# Patient Record
Sex: Male | Born: 1945
Health system: Southern US, Community
[De-identification: ages and names within clinical notes are randomized; demographics above are authoritative.]

## PROBLEM LIST (undated history)

## (undated) DIAGNOSIS — F32A Depression, unspecified: Secondary | ICD-10-CM

## (undated) DIAGNOSIS — R519 Headache, unspecified: Secondary | ICD-10-CM

## (undated) DIAGNOSIS — E119 Type 2 diabetes mellitus without complications: Secondary | ICD-10-CM

## (undated) DIAGNOSIS — B1920 Unspecified viral hepatitis C without hepatic coma: Secondary | ICD-10-CM

## (undated) DIAGNOSIS — E162 Hypoglycemia, unspecified: Secondary | ICD-10-CM

## (undated) DIAGNOSIS — I2510 Atherosclerotic heart disease of native coronary artery without angina pectoris: Secondary | ICD-10-CM

## (undated) DIAGNOSIS — I639 Cerebral infarction, unspecified: Secondary | ICD-10-CM

## (undated) DIAGNOSIS — T7840XA Allergy, unspecified, initial encounter: Secondary | ICD-10-CM

## (undated) DIAGNOSIS — E785 Hyperlipidemia, unspecified: Secondary | ICD-10-CM

## (undated) DIAGNOSIS — K219 Gastro-esophageal reflux disease without esophagitis: Secondary | ICD-10-CM

## (undated) DIAGNOSIS — F419 Anxiety disorder, unspecified: Secondary | ICD-10-CM

## (undated) DIAGNOSIS — I209 Angina pectoris, unspecified: Secondary | ICD-10-CM

## (undated) DIAGNOSIS — F329 Major depressive disorder, single episode, unspecified: Secondary | ICD-10-CM

## (undated) DIAGNOSIS — I10 Essential (primary) hypertension: Secondary | ICD-10-CM

## (undated) DIAGNOSIS — I509 Heart failure, unspecified: Secondary | ICD-10-CM

## (undated) DIAGNOSIS — R51 Headache: Secondary | ICD-10-CM

## (undated) HISTORY — DX: Headache, unspecified: R51.9

## (undated) HISTORY — DX: Essential (primary) hypertension: I10

## (undated) HISTORY — DX: Anxiety disorder, unspecified: F41.9

## (undated) HISTORY — DX: Allergy, unspecified, initial encounter: T78.40XA

## (undated) HISTORY — DX: Headache: R51

## (undated) HISTORY — DX: Major depressive disorder, single episode, unspecified: F32.9

## (undated) HISTORY — DX: Unspecified viral hepatitis C without hepatic coma: B19.20

## (undated) HISTORY — DX: Depression, unspecified: F32.A

## (undated) HISTORY — DX: Cerebral infarction, unspecified: I63.9

## (undated) HISTORY — PX: APPENDECTOMY: SHX54

## (undated) HISTORY — PX: COLONOSCOPY: SHX174

## (undated) HISTORY — PX: FRACTURE SURGERY: SHX138

## (undated) HISTORY — DX: Hypoglycemia, unspecified: E16.2

## (undated) HISTORY — DX: Hyperlipidemia, unspecified: E78.5

## (undated) HISTORY — PX: CORONARY ARTERY BYPASS GRAFT: SHX141

---
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
## 2007-08-09 ENCOUNTER — Emergency Department (HOSPITAL_COMMUNITY): Admission: EM | Admit: 2007-08-09 | Discharge: 2007-08-09 | Payer: Self-pay | Admitting: Emergency Medicine
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
## 2012-02-02 ENCOUNTER — Ambulatory Visit (INDEPENDENT_AMBULATORY_CARE_PROVIDER_SITE_OTHER): Payer: Medicare Other | Admitting: Family Medicine

## 2012-02-02 VITALS — BP 140/80 | HR 80 | Temp 98.0°F | Resp 16 | Ht 71.5 in | Wt 175.2 lb

## 2012-02-02 DIAGNOSIS — Z720 Tobacco use: Secondary | ICD-10-CM | POA: Insufficient documentation

## 2012-02-02 DIAGNOSIS — R030 Elevated blood-pressure reading, without diagnosis of hypertension: Secondary | ICD-10-CM

## 2012-02-02 DIAGNOSIS — I10 Essential (primary) hypertension: Secondary | ICD-10-CM | POA: Insufficient documentation

## 2012-02-02 DIAGNOSIS — J329 Chronic sinusitis, unspecified: Secondary | ICD-10-CM

## 2012-02-02 DIAGNOSIS — F17200 Nicotine dependence, unspecified, uncomplicated: Secondary | ICD-10-CM

## 2012-02-02 MED ORDER — PREDNISONE (PAK) 10 MG PO TABS: 20.0000 mg | ORAL_TABLET | Freq: Every day | ORAL | Status: AC

## 2012-02-02 MED ORDER — AMOXICILLIN 875 MG PO TABS: 875.0000 mg | ORAL_TABLET | Freq: Two times a day (BID) | ORAL | Status: DC

## 2012-02-02 NOTE — Progress Notes (Signed)
  Subjective:    Patient ID: Collin Bridges, male    DOB: [birth year], 66 y.o.   MRN: [id number]  HPI  Has had sinus problems since 57 yo.  Father also has bad sinus prob.  Every year, he gets sinus infection. The only thing that works for him is amoxicillin and prednisone.  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 Did have deviated septum correction.  Was told by ENT doctor not to use any nasal sprays as didn't have any affect on him.  Does netti pot daily.  Having sub f/c, worsening x 5 days now.    Review of Systems  Constitutional: Positive for fever and fatigue. Negative for chills.  HENT: Positive for ear pain. Negative for congestion, sore throat, rhinorrhea and ear discharge.   Respiratory: Negative for cough and shortness of breath.   Musculoskeletal: Positive for myalgias. Negative for gait problem.       Objective:   Physical Exam  Constitutional: He is oriented to person, place, and time. He appears well-developed and well-nourished. No distress.  HENT:  Head: Normocephalic and atraumatic.  Right Ear: External ear normal.  Left Ear: External ear normal.  Nose: Nose normal.  Mouth/Throat: Oropharynx is clear and moist. No oropharyngeal exudate.  Eyes: Conjunctivae normal and EOM are normal. Pupils are equal, round, and reactive to light. Right eye exhibits no discharge. Left eye exhibits no discharge. No scleral icterus.  Neck: Normal range of motion. Neck supple. No thyromegaly present.  Cardiovascular: Regular rhythm, normal heart sounds and intact distal pulses.   Pulmonary/Chest: Effort normal and breath sounds normal. No respiratory distress.  Lymphadenopathy:    He has no cervical adenopathy.  Neurological: He is alert and oriented to person, place, and time.  Skin: Skin is warm and dry. He is not diaphoretic.  Psychiatric: He has a normal mood and affect.          Assessment & Plan:  1. Sinusitis - Will cover w/ pt's normal regimen that works for  him - amox and pred. Refills given due to recurrent illness per pt request.  Cont daily netti pot 1. Elevated blood pressure -  Fast track RTC for recheck when feeling better. 2. Tob abuse - Not interested in cessation at all now.

## 2012-02-02 NOTE — Progress Notes (Signed)
Reviewed and agree.

## 2012-02-18 ENCOUNTER — Ambulatory Visit (INDEPENDENT_AMBULATORY_CARE_PROVIDER_SITE_OTHER): Payer: Medicare Other | Admitting: Family Medicine

## 2012-02-18 VITALS — BP 164/90 | HR 85 | Temp 97.9°F | Resp 16 | Ht 71.0 in | Wt 172.0 lb

## 2012-02-18 DIAGNOSIS — J019 Acute sinusitis, unspecified: Secondary | ICD-10-CM

## 2012-02-18 DIAGNOSIS — J329 Chronic sinusitis, unspecified: Secondary | ICD-10-CM

## 2012-02-18 MED ORDER — PREDNISONE 20 MG PO TABS: ORAL_TABLET | ORAL | Status: DC

## 2012-02-18 MED ORDER — LEVOFLOXACIN 500 MG PO TABS: 500.0000 mg | ORAL_TABLET | Freq: Every day | ORAL | Status: DC

## 2012-02-18 NOTE — Progress Notes (Signed)
@[email or web address] @   Patient ID: Collin Bridges MRN: [id number], DOB: [birth year], 66 y.o. Date of Encounter: 02/18/2012, 9:59 AM  Primary Physician: Elvina Sidle, MD  Chief Complaint:  Chief Complaint  Patient presents with  . Sinusitis    x 3 weeks     HPI: 66 y.o. year old male presents with 30 day history of nasal congestion, post nasal drip, sore throat, sinus pressure, and cough. Afebrile. No chills. Nasal congestion thick and green/yellow. Sinus pressure is the worst symptom. Cough is productive secondary to post nasal drip and not associated with time of day. Ears feel full, leading to sensation of muffled hearing and tinnitus. Has tried OTC cold preps without success. No GI complaints. Appetite poor  No recent antibiotics, recent travels, or sick contacts   No leg trauma, sedentary periods, h/o cancer, or tobacco use.  No past medical history on file.   Home Meds: Prior to Admission medications   Medication Sig Start Date End Date Taking? Authorizing Provider  amoxicillin-clavulanate (AUGMENTIN) 875-125 MG per tablet Take 1 tablet by mouth 2 (two) times daily.   Yes Historical Provider, MD  aspirin 81 MG tablet Take 325 mg by mouth daily.    Yes Historical Provider, MD  amoxicillin (AMOXIL) 875 MG tablet Take 1 tablet (875 mg total) by mouth 2 (two) times daily. 02/02/12   Norberto Sorenson, MD    Allergies: No Known Allergies  History   Social History  . Marital Status: Single    Spouse Name: N/A    Number of Children: N/A  . Years of Education: N/A   Occupational History  . Not on file.   Social History Main Topics  . Smoking status: Current Some Day Smoker  . Smokeless tobacco: Not on file  . Alcohol Use: Not on file  . Drug Use: Not on file  . Sexually Active: Not on file   Other Topics Concern  . Not on file   Social History Narrative  . No narrative on file     Review of Systems: Constitutional: negative for chills, fever, night sweats or weight  changes Cardiovascular: negative for chest pain or palpitations Respiratory: negative for hemoptysis, wheezing, or shortness of breath Abdominal: negative for abdominal pain, nausea, vomiting or diarrhea Dermatological: negative for rash Neurologic: negative for headache   Physical Exam Blood pressure 164/90, pulse 85, temperature 97.9 F (36.6 C), temperature source Oral, resp. rate 16, height 5\' 11"  (1.803 m), weight 172 lb (78.019 kg), SpO2 98.00%., Body mass index is 23.99 kg/(m^2). General: Well developed, well nourished, in no acute distress. Head: Normocephalic, atraumatic, eyes without discharge, sclera non-icteric, nares are congested. Bilateral auditory canals clear, TM's are without perforation, pearly grey with reflective cone of light bilaterally. Serous effusion bilaterally behind TM's. Maxillary sinus TTP. Oral cavity moist, dentition normal. Posterior pharynx with post nasal drip and mild erythema. No peritonsillar abscess or tonsillar exudate. Neck: Supple. No thyromegaly. Full ROM. No lymphadenopathy. Lungs: Clear bilaterally to auscultation without wheezes, rales, or rhonchi. Breathing is unlabored.  Heart: RRR with S1 S2. No murmurs, rubs, or gallops appreciated. Msk:  Strength and tone normal for age. Extremities: No clubbing or cyanosis. No edema. Neuro: Alert and oriented X 3. Moves all extremities spontaneously. CNII-XII grossly in tact. Psych:  Responds to questions appropriately with a normal affect.      ASSESSMENT AND PLAN:  66 y.o. year old male with sinusitis -  -Tylenol/Motrin prn -Rest/fluids -RTC precautions -RTC 3-5 days if no improvement  Signed, Kenyon Ana  Lacreasha Hinds, MD 02/18/2012 9:59 AM

## 2012-02-18 NOTE — Patient Instructions (Signed)
sinusitisSinusitis Sinusitis is redness, soreness, and swelling (inflammation) of the paranasal sinuses. Paranasal sinuses are air pockets within the bones of your face (beneath the eyes, the middle of the forehead, or above the eyes). In healthy paranasal sinuses, mucus is able to drain out, and air is able to circulate through them by way of your nose. However, when your paranasal sinuses are inflamed, mucus and air can become trapped. This can allow bacteria and other germs to grow and cause infection. Sinusitis can develop quickly and last only a short time (acute) or continue over a long period (chronic). Sinusitis that lasts for more than 12 weeks is considered chronic.  CAUSES  Causes of sinusitis include:  Allergies.  Structural abnormalities, such as displacement of the cartilage that separates your nostrils (deviated septum), which can decrease the air flow through your nose and sinuses and affect sinus drainage.  Functional abnormalities, such as when the small hairs (cilia) that line your sinuses and help remove mucus do not work properly or are not present. SYMPTOMS  Symptoms of acute and chronic sinusitis are the same. The primary symptoms are pain and pressure around the affected sinuses. Other symptoms include:  Upper toothache.  Earache.  Headache.  Bad breath.  Decreased sense of smell and taste.  A cough, which worsens when you are lying flat.  Fatigue.  Fever.  Thick drainage from your nose, which often is green and may contain pus (purulent).  Swelling and warmth over the affected sinuses. DIAGNOSIS  Your caregiver will perform a physical exam. During the exam, your caregiver may:  Look in your nose for signs of abnormal growths in your nostrils (nasal polyps).  Tap over the affected sinus to check for signs of infection.  View the inside of your sinuses (endoscopy) with a special imaging device with a light attached (endoscope), which is inserted into  your sinuses. If your caregiver suspects that you have chronic sinusitis, one or more of the following tests may be recommended:  Allergy tests.  Nasal culture A sample of mucus is taken from your nose and sent to a lab and screened for bacteria.  Nasal cytology A sample of mucus is taken from your nose and examined by your caregiver to determine if your sinusitis is related to an allergy. TREATMENT  Most cases of acute sinusitis are related to a viral infection and will resolve on their own within 10 days. Sometimes medicines are prescribed to help relieve symptoms (pain medicine, decongestants, nasal steroid sprays, or saline sprays).  However, for sinusitis related to a bacterial infection, your caregiver will prescribe antibiotic medicines. These are medicines that will help kill the bacteria causing the infection.  Rarely, sinusitis is caused by a fungal infection. In theses cases, your caregiver will prescribe antifungal medicine. For some cases of chronic sinusitis, surgery is needed. Generally, these are cases in which sinusitis recurs more than 3 times per year, despite other treatments. HOME CARE INSTRUCTIONS   Drink plenty of water. Water helps thin the mucus so your sinuses can drain more easily.  Use a humidifier.  Inhale steam 3 to 4 times a day (for example, sit in the bathroom with the shower running).  Apply a warm, moist washcloth to your face 3 to 4 times a day, or as directed by your caregiver.  Use saline nasal sprays to help moisten and clean your sinuses.  Take over-the-counter or prescription medicines for pain, discomfort, or fever only as directed by your caregiver. SEEK IMMEDIATE MEDICAL  CARE IF:  You have increasing pain or severe headaches.  You have nausea, vomiting, or drowsiness.  You have swelling around your face.  You have vision problems.  You have a stiff neck.  You have difficulty breathing. MAKE SURE YOU:   Understand these  instructions.  Will watch your condition.  Will get help right away if you are not doing well or get worse. Document Released: 05/02/2005 Document Revised: 07/25/2011 Document Reviewed: 05/17/2011 Exit[employer] Patient Information 2013 [employer].  

## 2012-02-24 ENCOUNTER — Other Ambulatory Visit: Payer: Self-pay | Admitting: Family Medicine

## 2012-02-24 ENCOUNTER — Telehealth: Payer: Self-pay

## 2012-02-24 DIAGNOSIS — J309 Allergic rhinitis, unspecified: Secondary | ICD-10-CM

## 2012-02-24 MED ORDER — MOMETASONE FUROATE 50 MCG/ACT NA SUSP: 2.0000 | Freq: Every day | NASAL | Status: DC

## 2012-02-24 NOTE — Telephone Encounter (Signed)
Patient states he took bee pollen and his symptoms returned, he is having fullness of his sinuses and swelling. Please advise if anything further is needed, I advised patient he may need a nasal spray. Will you review and let me know. He was advised to d/c the bee pollen. Advise and I will call him back .

## 2012-02-24 NOTE — Telephone Encounter (Signed)
PT STATES DR Kenyon Ana TOLD HIM TO CALL BACK IF NO BETTER, PLEASE CALL [phone]

## 2012-02-25 ENCOUNTER — Telehealth: Payer: Self-pay

## 2012-02-25 ENCOUNTER — Ambulatory Visit (INDEPENDENT_AMBULATORY_CARE_PROVIDER_SITE_OTHER): Payer: Medicare Other | Admitting: Emergency Medicine

## 2012-02-25 VITALS — BP 150/80 | HR 88 | Temp 98.3°F | Resp 16 | Ht 72.0 in | Wt 171.0 lb

## 2012-02-25 DIAGNOSIS — J309 Allergic rhinitis, unspecified: Secondary | ICD-10-CM

## 2012-02-25 MED ORDER — OLOPATADINE HCL 0.1 % OP SOLN: 1.0000 [drp] | Freq: Two times a day (BID) | OPHTHALMIC | Status: DC

## 2012-02-25 MED ORDER — LORATADINE 10 MG PO TABS: 10.0000 mg | ORAL_TABLET | Freq: Every day | ORAL | Status: DC

## 2012-02-25 MED ORDER — MOMETASONE FUROATE 50 MCG/ACT NA SUSP: 2.0000 | Freq: Every day | NASAL | Status: DC

## 2012-02-25 NOTE — Telephone Encounter (Signed)
Pt was seen today however needs to discuss getting a cheaper nasal spray. Pt stated he can not pay $156.00 for prescription. Please call pt back at  [phone]

## 2012-02-25 NOTE — Progress Notes (Signed)
[facility] [address] [place] [address] [phone]  Date:  02/25/2012   Name:  Collin Bridges   DOB:  12-Jul-1945   MRN:  [id number]  PCP:  Elvina Sidle, MD    Chief Complaint: Sinusitis   History of Present Illness:  Collin Bridges is a 66 y.o. very pleasant male patient who presents with the following:  Clear nasal drainage and post nasal discharge.  Watery itchy eyes.  No fever or chills, sore throat or cough.  No nausea or vomiting or stool change.  Has increased allergy symptoms every fall and this year are particularly severe for him.  Patient Active Problem List  Diagnosis  . Elevated blood pressure  . Tobacco abuse    No past medical history on file.  No past surgical history on file.  History  Substance Use Topics  . Smoking status: Current Some Day Smoker  . Smokeless tobacco: Not on file  . Alcohol Use: Not on file    No family history on file.  Allergies  Allergen Reactions  . Bee Pollen     Nasal congestion     Medication list has been reviewed and updated.  Current Outpatient Prescriptions on File Prior to Visit  Medication Sig Dispense Refill  . aspirin 81 MG tablet Take 325 mg by mouth daily.       Marland Kitchen levofloxacin (LEVAQUIN) 500 MG tablet Take 1 tablet (500 mg total) by mouth daily.  10 tablet  1  . mometasone (NASONEX) 50 MCG/ACT nasal spray Place 2 sprays into the nose daily.  17 g  12  . predniSONE (DELTASONE) 20 MG tablet 2 daily with food  6 tablet  1  . amoxicillin (AMOXIL) 875 MG tablet Take 1 tablet (875 mg total) by mouth 2 (two) times daily.  28 tablet  1  . amoxicillin-clavulanate (AUGMENTIN) 875-125 MG per tablet Take 1 tablet by mouth 2 (two) times daily.      Marland Kitchen loratadine (CLARITIN) 10 MG tablet Take 1 tablet (10 mg total) by mouth daily.  30 tablet  11    Review of Systems:  As per HPI, otherwise negative.    Physical Examination: Filed Vitals:   02/25/12 1335  BP: 150/80  Pulse: 88    Temp: 98.3 F (36.8 C)  Resp: 16   Filed Vitals:   02/25/12 1335  Height: 6' (1.829 m)  Weight: 171 lb (77.565 kg)   Body mass index is 23.19 kg/(m^2). Ideal Body Weight: Weight in (lb) to have BMI = 25: 183.9   GEN: WDWN, NAD, Non-toxic, A & O x 3 HEENT: Atraumatic, Normocephalic. Neck supple. No masses, No LAD. Ears and Nose: No external deformity. CV: RRR, No M/G/R. No JVD. No thrill. No extra heart sounds. PULM: CTA B, no wheezes, crackles, rhonchi. No retractions. No resp. distress. No accessory muscle use. ABD: S, NT, ND, +BS. No rebound. No HSM. EXTR: No c/c/e NEURO Normal gait.  PSYCH: Normally interactive. Conversant. Not depressed or anxious appearing.  Calm demeanor.    Assessment and Plan: Seasonal allergic rhinitis;  Claritin, nasonex Allergic conjunctivitis; patanol Follow up as needed  Carmelina Dane, MD  I have reviewed and agree with documentation. Robert P. Merla Riches, M.D.

## 2012-02-25 NOTE — Patient Instructions (Addendum)
Allergic Rhinitis  Allergic rhinitis is when the mucous membranes in the nose respond to allergens. Allergens are particles in the air that cause your body to have an allergic reaction. This causes you to release allergic antibodies. Through a chain of events, these eventually cause you to release histamine into the blood stream (hence the use of antihistamines). Although meant to be protective to the body, it is this release that causes your discomfort, such as frequent sneezing, congestion and an itchy runny nose.    CAUSES    The pollen allergens may come from grasses, trees, and weeds. This is seasonal allergic rhinitis, or "hay fever." Other allergens cause year-round allergic rhinitis (perennial allergic rhinitis) such as house dust mite allergen, pet dander and mold spores.    SYMPTOMS     Nasal stuffiness (congestion).   Runny, itchy nose with sneezing and tearing of the eyes.   There is often an itching of the mouth, eyes and ears.  It cannot be cured, but it can be controlled with medications.  DIAGNOSIS    If you are unable to determine the offending allergen, skin or blood testing may find it.  TREATMENT     Avoid the allergen.   Medications and allergy shots (immunotherapy) can help.   Hay fever may often be treated with antihistamines in pill or nasal spray forms. Antihistamines block the effects of histamine. There are over-the-counter medicines that may help with nasal congestion and swelling around the eyes. Check with your caregiver before taking or giving this medicine.  If the treatment above does not work, there are many new medications your caregiver can prescribe. Stronger medications may be used if initial measures are ineffective. Desensitizing injections can be used if medications and avoidance fails. Desensitization is when a patient is given ongoing shots until the body becomes less sensitive to the allergen. Make sure you follow up with your caregiver if problems continue.   SEEK MEDICAL CARE IF:     You develop fever (more than 100.5 F (38.1 C).   You develop a cough that does not stop easily (persistent).   You have shortness of breath.   You start wheezing.   Symptoms interfere with normal daily activities.  Document Released: 01/25/2001 Document Revised: 07/25/2011 Document Reviewed: 08/06/2008  Exit[employer] Patient Information 2013 [employer].

## 2012-02-25 NOTE — Progress Notes (Signed)
[facility] [address], [place] [address] [phone]  Date:  02/25/2012   Name:  Collin Bridges   DOB:  09/08/1945   MRN:  [id number]  PCP:  Elvina Sidle, MD    Chief Complaint: Sinusitis   History of Present Illness:  Collin Bridges is a 66 y.o. very pleasant male patient who presents with the following: states has sinusitis which started as possible allergy to bee pollen. States he has itching of his eyes, with watering. Has gotten worse recently.     Patient Active Problem List  Diagnosis  . Elevated blood pressure  . Tobacco abuse    No past medical history on file.  No past surgical history on file.  History  Substance Use Topics  . Smoking status: Current Some Day Smoker  . Smokeless tobacco: Not on file  . Alcohol Use: Not on file    No family history on file.  No Known Allergies  Medication list has been reviewed and updated.  Current Outpatient Prescriptions on File Prior to Visit  Medication Sig Dispense Refill  . aspirin 81 MG tablet Take 325 mg by mouth daily.       Marland Kitchen levofloxacin (LEVAQUIN) 500 MG tablet Take 1 tablet (500 mg total) by mouth daily.  10 tablet  1  . mometasone (NASONEX) 50 MCG/ACT nasal spray Place 2 sprays into the nose daily.  17 g  12  . predniSONE (DELTASONE) 20 MG tablet 2 daily with food  6 tablet  1  . amoxicillin (AMOXIL) 875 MG tablet Take 1 tablet (875 mg total) by mouth 2 (two) times daily.  28 tablet  1  . amoxicillin-clavulanate (AUGMENTIN) 875-125 MG per tablet Take 1 tablet by mouth 2 (two) times daily.        Review of Systems:    Physical Examination: Filed Vitals:   02/25/12 1335  BP: 150/80  Pulse: 88  Temp: 98.3 F (36.8 C)  Resp: 16   Filed Vitals:   02/25/12 1335  Height: 6' (1.829 m)  Weight: 171 lb (77.565 kg)   Body mass index is 23.19 kg/(m^2). Ideal Body Weight: Weight in (lb) to have BMI = 25: 183.9     Assessment and Plan: Allergic rhinitis Eye  irritation from allergies Claritin daily Nasal spray daily, instruction given for nasal spray Norberto Wishon Wall, RT

## 2012-02-26 MED ORDER — FLUTICASONE PROPIONATE 50 MCG/ACT NA SUSP: 2.0000 | Freq: Every day | NASAL | Status: DC

## 2012-02-26 NOTE — Telephone Encounter (Signed)
I sent in some flonase for the patient, it is generic so it should be cheaper.

## 2012-02-26 NOTE — Telephone Encounter (Signed)
patient notified an voiced understanding.

## 2012-02-28 ENCOUNTER — Telehealth: Payer: Self-pay

## 2012-02-28 DIAGNOSIS — J309 Allergic rhinitis, unspecified: Secondary | ICD-10-CM

## 2012-02-28 NOTE — Telephone Encounter (Signed)
Patient is wanting to know if we could call him in an inhaler.  Patient also asks to be referred for his allergies.    Pharmacy: [facility]- on [address]   Best#: [phone]

## 2012-02-29 NOTE — Telephone Encounter (Signed)
Patient advised we will call with the allergist appt . The allergist can advise on the inhaler. He states he gets short of breath at times, he is advised to come in for any shortness of breath,he said if it gets worse or persists he will come in.

## 2012-02-29 NOTE — Telephone Encounter (Signed)
Need to know which inhaler he needs. May have to call pharmacy to find out since patient isn't sure what he is using. Referral for allergist done

## 2012-03-01 MED ORDER — FLUTICASONE PROPIONATE 50 MCG/ACT NA SUSP: 2.0000 | Freq: Every day | NASAL | Status: DC

## 2012-03-01 NOTE — Telephone Encounter (Signed)
Changed Rx for flonase to include 12 RFs that Dr Dareen Piano (and Dr L) had put on orig Rx for Nasonex (sent to pharmacy).Notified pt done.

## 2012-03-01 NOTE — Telephone Encounter (Signed)
Pt was using Nasonex and had 12 refills on it,but switched to cheaper generic.  Rone would like to have the 12 refills on the generic nasal spray called into [facility] on [address].  [phone] home [phone] cell

## 2012-05-03 ENCOUNTER — Other Ambulatory Visit: Payer: Self-pay | Admitting: *Deleted

## 2012-05-11 ENCOUNTER — Other Ambulatory Visit: Payer: Self-pay | Admitting: *Deleted

## 2013-07-25 DIAGNOSIS — F411 Generalized anxiety disorder: Secondary | ICD-10-CM | POA: Diagnosis not present

## 2013-10-17 DIAGNOSIS — F411 Generalized anxiety disorder: Secondary | ICD-10-CM | POA: Diagnosis not present

## 2013-10-17 DIAGNOSIS — F410 Panic disorder [episodic paroxysmal anxiety] without agoraphobia: Secondary | ICD-10-CM | POA: Diagnosis not present

## 2014-01-09 DIAGNOSIS — F411 Generalized anxiety disorder: Secondary | ICD-10-CM | POA: Diagnosis not present

## 2014-04-01 DIAGNOSIS — F411 Generalized anxiety disorder: Secondary | ICD-10-CM | POA: Diagnosis not present

## 2014-09-04 DIAGNOSIS — F411 Generalized anxiety disorder: Secondary | ICD-10-CM | POA: Diagnosis not present

## 2014-09-18 DIAGNOSIS — H1045 Other chronic allergic conjunctivitis: Secondary | ICD-10-CM | POA: Diagnosis not present

## 2014-09-18 DIAGNOSIS — J019 Acute sinusitis, unspecified: Secondary | ICD-10-CM | POA: Diagnosis not present

## 2014-09-18 DIAGNOSIS — J301 Allergic rhinitis due to pollen: Secondary | ICD-10-CM | POA: Diagnosis not present

## 2014-10-30 DIAGNOSIS — F411 Generalized anxiety disorder: Secondary | ICD-10-CM | POA: Diagnosis not present

## 2015-02-12 DIAGNOSIS — F411 Generalized anxiety disorder: Secondary | ICD-10-CM | POA: Diagnosis not present

## 2015-04-24 DIAGNOSIS — F411 Generalized anxiety disorder: Secondary | ICD-10-CM | POA: Diagnosis not present

## 2015-07-21 DIAGNOSIS — F411 Generalized anxiety disorder: Secondary | ICD-10-CM | POA: Diagnosis not present

## 2015-09-17 DIAGNOSIS — J301 Allergic rhinitis due to pollen: Secondary | ICD-10-CM | POA: Diagnosis not present

## 2015-09-17 DIAGNOSIS — H1045 Other chronic allergic conjunctivitis: Secondary | ICD-10-CM | POA: Diagnosis not present

## 2015-10-15 DIAGNOSIS — F411 Generalized anxiety disorder: Secondary | ICD-10-CM | POA: Diagnosis not present

## 2015-12-17 DIAGNOSIS — F411 Generalized anxiety disorder: Secondary | ICD-10-CM | POA: Diagnosis not present

## 2016-01-06 ENCOUNTER — Encounter: Payer: Self-pay | Admitting: Nurse Practitioner

## 2016-01-06 ENCOUNTER — Other Ambulatory Visit (INDEPENDENT_AMBULATORY_CARE_PROVIDER_SITE_OTHER): Payer: Medicare Other

## 2016-01-06 ENCOUNTER — Ambulatory Visit (INDEPENDENT_AMBULATORY_CARE_PROVIDER_SITE_OTHER): Payer: Medicare Other | Admitting: Nurse Practitioner

## 2016-01-06 ENCOUNTER — Ambulatory Visit: Payer: Self-pay | Admitting: Family

## 2016-01-06 VITALS — BP 200/96 | HR 104 | Temp 98.7°F | Resp 18 | Ht 72.0 in | Wt 184.0 lb

## 2016-01-06 DIAGNOSIS — I10 Essential (primary) hypertension: Secondary | ICD-10-CM

## 2016-01-06 DIAGNOSIS — Z8619 Personal history of other infectious and parasitic diseases: Secondary | ICD-10-CM | POA: Insufficient documentation

## 2016-01-06 DIAGNOSIS — F419 Anxiety disorder, unspecified: Secondary | ICD-10-CM

## 2016-01-06 DIAGNOSIS — N401 Enlarged prostate with lower urinary tract symptoms: Secondary | ICD-10-CM

## 2016-01-06 DIAGNOSIS — R011 Cardiac murmur, unspecified: Secondary | ICD-10-CM | POA: Diagnosis not present

## 2016-01-06 DIAGNOSIS — R350 Frequency of micturition: Secondary | ICD-10-CM

## 2016-01-06 LAB — BASIC METABOLIC PANEL
BUN: 10 mg/dL (ref 6–23)
CALCIUM: 9 mg/dL (ref 8.4–10.5)
CHLORIDE: 104 meq/L (ref 96–112)
CO2: 33 meq/L — AB (ref 19–32)
CREATININE: 1.16 mg/dL (ref 0.40–1.50)
GFR: 66 mL/min (ref >60.00)
Glucose, Bld: 96 mg/dL (ref 70–99)
Potassium: 4.4 mEq/L (ref 3.5–5.1)
Sodium: 142 mEq/L (ref 135–145)

## 2016-01-06 LAB — URINALYSIS, ROUTINE W REFLEX MICROSCOPIC
Bilirubin Urine: NEGATIVE
HGB URINE DIPSTICK: NEGATIVE
Ketones, ur: NEGATIVE
Leukocytes, UA: NEGATIVE
NITRITE: NEGATIVE
RBC / HPF: NONE SEEN (ref 0–?)
Total Protein, Urine: NEGATIVE
Urine Glucose: NEGATIVE
Urobilinogen, UA: 0.2 (ref 0.0–1.0)
WBC UA: NONE SEEN (ref 0–?)
pH: 7 (ref 5.0–8.0)

## 2016-01-06 LAB — PSA: PSA: 3 ng/mL (ref 0.10–4.00)

## 2016-01-06 LAB — TSH: TSH: 0.89 u[IU]/mL (ref 0.35–4.50)

## 2016-01-06 MED ORDER — PAROXETINE HCL 20 MG PO TABS: 20.0000 mg | ORAL_TABLET | Freq: Every day | ORAL | 2 refills | Status: DC

## 2016-01-06 MED ORDER — AMLODIPINE BESYLATE 5 MG PO TABS: 5.0000 mg | ORAL_TABLET | Freq: Every day | ORAL | 2 refills | Status: DC

## 2016-01-06 NOTE — Progress Notes (Addendum)
Subjective:    Patient ID: Collin Bridges, male    DOB: 17-Dec-1945, 70 y.o.   MRN: [id number]  Patient presents today for complete physical or establish care (new patient), HTN, anxiety and enlarged prostate.  Diet: healthy Weight: not concerned, stable Wt Readings from Last 3 Encounters:  01/06/16 184 lb (83.5 kg)  02/25/12 171 lb (77.6 kg)  02/18/12 172 lb (78 kg)   Exercise: daily resistant training and walking Fall Risk: no Home Safety: home alone Depression screen: yes Colonoscopy (every 5-53yrs, >50-17yrs): never had one PSA (yearly, >88yrs): never done Vision: up to date, corrective lens(bifocal),no cataract, no glaucoma Dental: up to date  Advanced Directive: yes Sexual History (birth control, marital status, STD): not sexually active, divorced, estranged from son. Tobacco Use: quit 80yrs ago. Blood Pressure: never taken BP med. Does not check BP at home,  Caffeine use: several cups per day. ETOH use: no Drug use: no  Urinary Frequency   This is a chronic problem. The current episode started more than 1 month ago. The problem occurs every urination. The problem has been unchanged. The pain is at a severity of 0/10. The patient is experiencing no pain. There has been no fever. He is not sexually active. There is no history of pyelonephritis. Associated symptoms include frequency and hesitancy. Pertinent negatives include no discharge, flank pain, hematuria, nausea, sweats or urgency. He has tried nothing for the symptoms. There is no history of kidney stones, recurrent UTIs, urinary stasis or a urological procedure. He is concerned about worsening enlarged prostate. Never been evaluated by urologist or had prostate exam.  Hypertension  This is a chronic problem. The current episode started more than 1 year ago. The problem has been waxing and waning since onset. The problem is uncontrolled. Associated symptoms include anxiety. Pertinent negatives include no blurred  vision, chest pain, headaches, malaise/fatigue, neck pain, palpitations, peripheral edema, shortness of breath or sweats. Agents associated with hypertension include decongestants and NSAIDs (take OTC decongestant daily due to seasonal allergy symptoms). Risk factors for coronary artery disease include family history. Past treatments include nothing. There are no compliance problems.   Anxiety  Presents for initial visit. Onset was more than 5 years ago. The problem has been gradually worsening. Symptoms include decreased concentration, depressed mood, excessive worry, insomnia, nervous/anxious behavior and restlessness. Patient reports no chest pain, nausea, palpitations, shortness of breath or suicidal ideas. Symptoms occur constantly. The severity of symptoms is severe and interfering with daily activities. The symptoms are aggravated by family issues and social activities (financial strain). The quality of sleep is poor. Nighttime awakenings: one to two.   There are no known risk factors. His past medical history is significant for anxiety/panic attacks and depression. There is no history of suicide attempts. (He is concerned about worsening enlarged prostate. Never been evaluated by urologist or had prostate exam.) Past treatments include counseling (CBT) and non-SSRI antidepressants (Current use of gabapentin and ambien prescribed by [facility] in [place]). The treatment provided mild relief. Compliance with prior treatments has been good.  States he is unable to return to [place] due to lack of Insurance coverage.  Medications and allergies reviewed with patient and updated if appropriate.  Patient Active Problem List   Diagnosis Date Noted  . Anxiety disorder 01/06/2016  . Benign prostatic hypertrophy with urinary frequency 01/06/2016  . Essential hypertension 02/02/2012    Current Outpatient Prescriptions on File Prior to Visit  Medication Sig Dispense Refill  . fluticasone  (  FLONASE) 50 MCG/ACT nasal spray Place 2 sprays into the nose daily. 16 g 12   No current facility-administered medications on file prior to visit.     Past Medical History:  Diagnosis Date  . Allergy    seasonal  . Anxiety   . Depression   . Headache   . Hepatitis C   . Hypertension   . Hypoglycemia     Past Surgical History:  Procedure Laterality Date  . APPENDECTOMY    . TONSILECTOMY, ADENOIDECTOMY, BILATERAL MYRINGOTOMY AND TUBES      Social History   Social History  . Marital status: Single    Spouse name: N/A  . Number of children: N/A  . Years of education: N/A   Social History Main Topics  . Smoking status: Former Research scientist (life sciences)  . Smokeless tobacco: Never Used     Comment: quit 56yrs ago, smoke a cigar occassionally.  . Alcohol use No  . Drug use: No  . Sexual activity: Yes   Other Topics Concern  . None   Social History Narrative  . None    Family History  Problem Relation Age of Onset  . Hyperlipidemia Father   . Hypertension Father     Review of Systems  Constitutional: Negative.  Negative for malaise/fatigue.  HENT: Positive for congestion, postnasal drip and sneezing. Negative for rhinorrhea, sinus pressure and sore throat.   Eyes: Negative for blurred vision and visual disturbance.  Respiratory: Negative for cough and shortness of breath.   Cardiovascular: Negative for chest pain and palpitations.  Gastrointestinal: Negative for abdominal pain, blood in stool, constipation, diarrhea and nausea.  Genitourinary: Positive for frequency and hesitancy. Negative for decreased urine volume, dysuria, flank pain, hematuria, penile pain, penile swelling, scrotal swelling and urgency.  Musculoskeletal: Negative for back pain, gait problem, myalgias and neck pain.  Skin: Negative.   Neurological: Negative for headaches.  Hematological: Negative for adenopathy.  Psychiatric/Behavioral: Positive for decreased concentration and sleep disturbance. Negative for  suicidal ideas. The patient is nervous/anxious and has insomnia.        Objective:   Vitals:   01/06/16 1342  BP: (!) 200/96  Pulse: (!) 104  Resp: 18  Temp: 98.7 F (37.1 C)    Filed Weights   01/06/16 1342  Weight: 184 lb (83.5 kg)    Body mass index is 24.95 kg/m.  <ECGINTERP>  NSR with possible RBBB. No ST interval or T wave abnormality. Patient referred for echocardiogram.                 Physical Exam  Constitutional: He is oriented to person, place, and time. He appears well-developed and well-nourished. No distress.  HENT:  Nose: Mucosal edema present. No rhinorrhea.  Mouth/Throat: Uvula is midline. Posterior oropharyngeal erythema present. No oropharyngeal exudate or posterior oropharyngeal edema.  Eyes: Conjunctivae and EOM are normal. Pupils are equal, round, and reactive to light.  Neck: Normal range of motion. Neck supple. No thyromegaly present.  Cardiovascular: Normal rate and regular rhythm.  Exam reveals no gallop and no friction rub.   Murmur heard. Pulmonary/Chest: Effort normal and breath sounds normal.  Abdominal: Soft. Bowel sounds are normal. He exhibits no distension. There is no tenderness.  No bruit  Musculoskeletal: Normal range of motion. He exhibits no edema or tenderness.  Lymphadenopathy:    He has no cervical adenopathy.  Neurological: He is alert and oriented to person, place, and time.  Skin: Skin is warm and dry.  Vitals reviewed.  ASSESSMENT  AND PLAN:  Dreu was seen today for establish care.  Diagnoses and all orders for this visit:  Essential hypertension -     Basic Metabolic Panel (BMET); Future -     EKG 12-Lead -     ECHOCARDIOGRAM COMPLETE; Future -     amLODipine (NORVASC) 5 MG tablet; Take 1 tablet (5 mg total) by mouth daily.  Benign prostatic hypertrophy with urinary frequency -     PSA; Future -     Urinalysis, Routine w reflex microscopic; Future -     Ambulatory referral to Urology  Anxiety  disorder, unspecified anxiety disorder type -     TSH; Future -     PARoxetine (PAXIL) 20 MG tablet; Take 1 tablet (20 mg total) by mouth daily.  Heart murmur on physical examination -     ECHOCARDIOGRAM COMPLETE; Future  Hx of hepatitis C  Obtain records from [facility].  Follow up in 49month.

## 2016-01-06 NOTE — Assessment & Plan Note (Signed)
Current use of gabapentin x 3years. Start paxil also.

## 2016-01-06 NOTE — Assessment & Plan Note (Signed)
No beta blocker due to current depression. Start amlodipine. F/up in 55month

## 2016-01-06 NOTE — Assessment & Plan Note (Signed)
Patient states virus was cleared with medication. Does not remember name.

## 2016-01-06 NOTE — Patient Instructions (Addendum)
I instructed pt to start 1/2 tablet once daily for 1 week and then increase to a full tablet once daily on week two as tolerated.   We discussed common side effects such as nausea, drowsiness and weight gain.  Also discussed rare but serious side effect of suicide ideation.  She is instructed to discontinue medication go directly to ED if this occurs.  Pt verbalizes understanding.   Plan follow up in 1 month to evaluate progress.    Start amlodipine today. Check BP at home and record. Do not take any medication with decongestant. Will call with lab results You will be contacted for Echocardiogram schedule. Decrease caffeine use

## 2016-02-01 ENCOUNTER — Encounter: Payer: Self-pay | Admitting: Nurse Practitioner

## 2016-02-09 ENCOUNTER — Other Ambulatory Visit (HOSPITAL_COMMUNITY): Payer: Medicare Other

## 2016-02-09 ENCOUNTER — Ambulatory Visit (HOSPITAL_COMMUNITY): Payer: Medicare Other | Attending: Cardiology

## 2016-02-09 ENCOUNTER — Other Ambulatory Visit: Payer: Self-pay

## 2016-02-09 DIAGNOSIS — I119 Hypertensive heart disease without heart failure: Secondary | ICD-10-CM | POA: Insufficient documentation

## 2016-02-09 DIAGNOSIS — I071 Rheumatic tricuspid insufficiency: Secondary | ICD-10-CM | POA: Diagnosis not present

## 2016-02-09 DIAGNOSIS — I350 Nonrheumatic aortic (valve) stenosis: Secondary | ICD-10-CM | POA: Diagnosis not present

## 2016-02-09 DIAGNOSIS — R011 Cardiac murmur, unspecified: Secondary | ICD-10-CM

## 2016-02-09 DIAGNOSIS — I10 Essential (primary) hypertension: Secondary | ICD-10-CM

## 2016-02-09 DIAGNOSIS — Z87891 Personal history of nicotine dependence: Secondary | ICD-10-CM | POA: Insufficient documentation

## 2016-02-11 ENCOUNTER — Encounter: Payer: Self-pay | Admitting: Nurse Practitioner

## 2016-02-11 ENCOUNTER — Ambulatory Visit (INDEPENDENT_AMBULATORY_CARE_PROVIDER_SITE_OTHER): Payer: Medicare Other | Admitting: Nurse Practitioner

## 2016-02-11 VITALS — BP 132/78 | HR 88 | Temp 97.1°F | Ht 72.0 in | Wt 180.0 lb

## 2016-02-11 DIAGNOSIS — F419 Anxiety disorder, unspecified: Secondary | ICD-10-CM

## 2016-02-11 DIAGNOSIS — I10 Essential (primary) hypertension: Secondary | ICD-10-CM | POA: Diagnosis not present

## 2016-02-11 DIAGNOSIS — Z23 Encounter for immunization: Secondary | ICD-10-CM

## 2016-02-11 MED ORDER — PAROXETINE HCL 20 MG PO TABS: 30.0000 mg | ORAL_TABLET | Freq: Every day | ORAL | 2 refills | Status: DC

## 2016-02-11 NOTE — Assessment & Plan Note (Signed)
Some improvement of symptoms Increased Paxil to 30mg  daily.

## 2016-02-11 NOTE — Patient Instructions (Signed)
Increase paxil dose to 1.5tab per day Continue current BP medication as prescribed. Follow up in 40months

## 2016-02-11 NOTE — Assessment & Plan Note (Addendum)
Maintain current dose of amlodipine. Echocardiogram indicates normal EF and mild LVH.

## 2016-02-11 NOTE — Progress Notes (Signed)
Pre visit review using our clinic review tool, if applicable. No additional management support is needed unless otherwise documented below in the visit note. 

## 2016-02-11 NOTE — Progress Notes (Signed)
Subjective:  Patient ID: Collin Bridges, male    DOB: [birth year]  Age: 70 y.o. MRN: [id number]  CC: Anxiety ( Pt stated sleep better at night but still having frequent urination.) and Hypertension (f/up)   HPI  HTN: tolerating medication fine, denies any adverse effects, no Cp, no headache, no palpitation, no edema.  Anxiety: Tolerating paxil fine, no suicidal or homicidal ideation, no hallucinations. States that sleep has improved. He is requesting for an increase in medication dose.  Outpatient Medications Prior to Visit  Medication Sig Dispense Refill  . amLODipine (NORVASC) 5 MG tablet Take 1 tablet (5 mg total) by mouth daily. 30 tablet 2  . fluticasone (FLONASE) 50 MCG/ACT nasal spray Place 2 sprays into the nose daily. 16 g 12  . gabapentin (NEURONTIN) 300 MG capsule Take 300 mg by mouth 3 (three) times daily.    Marland Kitchen zolpidem (AMBIEN) 10 MG tablet Take 10 mg by mouth at bedtime as needed for sleep.    Marland Kitchen PARoxetine (PAXIL) 20 MG tablet Take 1 tablet (20 mg total) by mouth daily. 30 tablet 2   No facility-administered medications prior to visit.     ROS See HPI  Objective:  BP 132/78 (BP Location: Left Arm, Patient Position: Sitting, Cuff Size: Normal)   Pulse 88   Temp 97.1 F (36.2 C)   Ht 6' (1.829 m)   Wt 180 lb (81.6 kg)   SpO2 98%   BMI 24.41 kg/m   BP Readings from Last 3 Encounters:  02/11/16 132/78  01/06/16 (!) 200/96  02/25/12 (!) 150/80    Wt Readings from Last 3 Encounters:  02/11/16 180 lb (81.6 kg)  01/06/16 184 lb (83.5 kg)  02/25/12 171 lb (77.6 kg)    Physical Exam  Constitutional: He is oriented to person, place, and time. No distress.  Eyes: No scleral icterus.  Neck: Normal range of motion. Neck supple.  Cardiovascular: Normal rate and regular rhythm.   Pulmonary/Chest: Effort normal and breath sounds normal.  Neurological: He is alert and oriented to person, place, and time.  Skin: Skin is warm and dry.  Psychiatric: He  has a normal mood and affect. His behavior is normal. Thought content normal.  Vitals reviewed.   Lab Results  Component Value Date   GLUCOSE 96 01/06/2016   NA 142 01/06/2016   K 4.4 01/06/2016   CL 104 01/06/2016   CREATININE 1.16 01/06/2016   BUN 10 01/06/2016   CO2 33 (H) 01/06/2016   TSH 0.89 01/06/2016   PSA 3.00 01/06/2016     Assessment & Plan:   Collin Bridges was seen today for anxiety and hypertension.  Diagnoses and all orders for this visit:  Need for prophylactic vaccination and inoculation against influenza -     Flu vaccine HIGH DOSE PF  Anxiety disorder, unspecified anxiety disorder type -     PARoxetine (PAXIL) 20 MG tablet; Take 1.5 tablets (30 mg total) by mouth daily.  Essential hypertension   I have changed Collin Bridges PARoxetine. I am also having him maintain his fluticasone, gabapentin, zolpidem, and amLODipine.  Meds ordered this encounter  Medications  . PARoxetine (PAXIL) 20 MG tablet    Sig: Take 1.5 tablets (30 mg total) by mouth daily.    Dispense:  60 tablet    Refill:  2    Order Specific Question:   Supervising Provider    Answer:   Cassandria Anger [[id number]]    Follow-up: Return in about 6 months (around  08/10/2016) for HTN and anxiety.  Wilfred Lacy, NP

## 2016-02-23 ENCOUNTER — Encounter: Payer: Self-pay | Admitting: Nurse Practitioner

## 2016-03-01 ENCOUNTER — Other Ambulatory Visit: Payer: Self-pay | Admitting: Nurse Practitioner

## 2016-03-01 DIAGNOSIS — F419 Anxiety disorder, unspecified: Secondary | ICD-10-CM

## 2016-03-01 MED ORDER — PAROXETINE HCL 20 MG PO TABS: 30.0000 mg | ORAL_TABLET | Freq: Every day | ORAL | 3 refills | Status: DC

## 2016-03-11 DIAGNOSIS — N5201 Erectile dysfunction due to arterial insufficiency: Secondary | ICD-10-CM | POA: Diagnosis not present

## 2016-03-11 DIAGNOSIS — R350 Frequency of micturition: Secondary | ICD-10-CM | POA: Diagnosis not present

## 2016-03-11 DIAGNOSIS — R3915 Urgency of urination: Secondary | ICD-10-CM | POA: Diagnosis not present

## 2016-03-29 ENCOUNTER — Other Ambulatory Visit: Payer: Self-pay | Admitting: Nurse Practitioner

## 2016-03-29 ENCOUNTER — Encounter: Payer: Self-pay | Admitting: Nurse Practitioner

## 2016-03-29 DIAGNOSIS — F419 Anxiety disorder, unspecified: Secondary | ICD-10-CM

## 2016-03-29 DIAGNOSIS — I10 Essential (primary) hypertension: Secondary | ICD-10-CM

## 2016-03-29 MED ORDER — PAROXETINE HCL 20 MG PO TABS: 30.0000 mg | ORAL_TABLET | Freq: Every day | ORAL | 3 refills | Status: DC

## 2016-03-30 ENCOUNTER — Encounter: Payer: Self-pay | Admitting: Nurse Practitioner

## 2016-03-30 DIAGNOSIS — N529 Male erectile dysfunction, unspecified: Secondary | ICD-10-CM | POA: Insufficient documentation

## 2016-06-13 ENCOUNTER — Telehealth: Payer: Self-pay | Admitting: Nurse Practitioner

## 2016-06-13 ENCOUNTER — Other Ambulatory Visit: Payer: Self-pay | Admitting: Nurse Practitioner

## 2016-06-13 ENCOUNTER — Encounter: Payer: Self-pay | Admitting: Nurse Practitioner

## 2016-06-13 DIAGNOSIS — I10 Essential (primary) hypertension: Secondary | ICD-10-CM

## 2016-06-13 DIAGNOSIS — F419 Anxiety disorder, unspecified: Secondary | ICD-10-CM

## 2016-06-13 MED ORDER — PAROXETINE HCL 20 MG PO TABS: 30.0000 mg | ORAL_TABLET | Freq: Every day | ORAL | 3 refills | Status: DC

## 2016-06-13 MED ORDER — AMLODIPINE BESYLATE 5 MG PO TABS: 5.0000 mg | ORAL_TABLET | Freq: Every day | ORAL | 3 refills | Status: DC

## 2016-06-13 NOTE — Telephone Encounter (Signed)
Routing to [place]----I see a note to pharmacy that maximum refills have been reached---please advise, thanks

## 2016-06-13 NOTE — Telephone Encounter (Signed)
Dr. Jenny Reichmann, patient is interested in transferring his care to you. He has been seeing Baldo Ash but would like to see a Male MD. Is this OK with you?

## 2016-06-14 NOTE — Telephone Encounter (Signed)
Ok with me 

## 2016-06-14 NOTE — Telephone Encounter (Signed)
Maximum refills on what?

## 2016-06-17 NOTE — Telephone Encounter (Signed)
Collin Bridges,  Please call pt to schedule a 30 min OV with Dr. Jenny Reichmann in late March. I have cancelled his appt with Baldo Ash.

## 2016-06-24 NOTE — Telephone Encounter (Signed)
Appt with Dr. Jenny Reichmann 07/29/16.

## 2016-07-29 ENCOUNTER — Other Ambulatory Visit (INDEPENDENT_AMBULATORY_CARE_PROVIDER_SITE_OTHER): Payer: Medicare Other

## 2016-07-29 ENCOUNTER — Ambulatory Visit (INDEPENDENT_AMBULATORY_CARE_PROVIDER_SITE_OTHER): Payer: Medicare Other | Admitting: Internal Medicine

## 2016-07-29 ENCOUNTER — Encounter: Payer: Self-pay | Admitting: Internal Medicine

## 2016-07-29 ENCOUNTER — Other Ambulatory Visit: Payer: Self-pay | Admitting: Internal Medicine

## 2016-07-29 VITALS — BP 142/92 | HR 89 | Temp 97.6°F | Wt 184.0 lb

## 2016-07-29 DIAGNOSIS — R972 Elevated prostate specific antigen [PSA]: Secondary | ICD-10-CM

## 2016-07-29 DIAGNOSIS — Z23 Encounter for immunization: Secondary | ICD-10-CM

## 2016-07-29 DIAGNOSIS — I10 Essential (primary) hypertension: Secondary | ICD-10-CM

## 2016-07-29 DIAGNOSIS — Z0001 Encounter for general adult medical examination with abnormal findings: Secondary | ICD-10-CM | POA: Insufficient documentation

## 2016-07-29 DIAGNOSIS — F419 Anxiety disorder, unspecified: Secondary | ICD-10-CM

## 2016-07-29 DIAGNOSIS — I350 Nonrheumatic aortic (valve) stenosis: Secondary | ICD-10-CM | POA: Insufficient documentation

## 2016-07-29 DIAGNOSIS — B1920 Unspecified viral hepatitis C without hepatic coma: Secondary | ICD-10-CM

## 2016-07-29 DIAGNOSIS — J019 Acute sinusitis, unspecified: Secondary | ICD-10-CM

## 2016-07-29 DIAGNOSIS — Z0000 Encounter for general adult medical examination without abnormal findings: Secondary | ICD-10-CM | POA: Insufficient documentation

## 2016-07-29 DIAGNOSIS — G4700 Insomnia, unspecified: Secondary | ICD-10-CM | POA: Insufficient documentation

## 2016-07-29 LAB — BASIC METABOLIC PANEL
BUN: 9 mg/dL (ref 6–23)
CO2: 30 meq/L (ref 19–32)
Calcium: 9.6 mg/dL (ref 8.4–10.5)
Chloride: 105 mEq/L (ref 96–112)
Creatinine, Ser: 1.09 mg/dL (ref 0.40–1.50)
GFR: 70.8 mL/min (ref >60.00)
Glucose, Bld: 118 mg/dL — ABNORMAL HIGH (ref 70–99)
POTASSIUM: 4.5 meq/L (ref 3.5–5.1)
SODIUM: 139 meq/L (ref 135–145)

## 2016-07-29 LAB — URINALYSIS, ROUTINE W REFLEX MICROSCOPIC
BILIRUBIN URINE: NEGATIVE
Hgb urine dipstick: NEGATIVE
KETONES UR: NEGATIVE
LEUKOCYTES UA: NEGATIVE
NITRITE: NEGATIVE
RBC / HPF: NONE SEEN (ref 0–?)
SPECIFIC GRAVITY, URINE: 1.015 (ref 1.000–1.030)
Total Protein, Urine: NEGATIVE
Urine Glucose: NEGATIVE
Urobilinogen, UA: 0.2 (ref 0.0–1.0)
pH: 7 (ref 5.0–8.0)

## 2016-07-29 LAB — PSA: PSA: 2.08 ng/mL (ref 0.10–4.00)

## 2016-07-29 LAB — HEPATIC FUNCTION PANEL
ALBUMIN: 4.4 g/dL (ref 3.5–5.2)
ALK PHOS: 106 U/L (ref 39–117)
ALT: 20 U/L (ref 0–53)
AST: 30 U/L (ref 0–37)
Bilirubin, Direct: 0.1 mg/dL (ref 0.0–0.3)
TOTAL PROTEIN: 7 g/dL (ref 6.0–8.3)
Total Bilirubin: 0.5 mg/dL (ref 0.2–1.2)

## 2016-07-29 LAB — LIPID PANEL
CHOLESTEROL: 260 mg/dL — AB (ref 0–200)
HDL: 46.6 mg/dL (ref >39.00)
LDL CALC: 178 mg/dL — AB (ref 0–99)
NonHDL: 213.64
TRIGLYCERIDES: 180 mg/dL — AB (ref 0.0–149.0)
Total CHOL/HDL Ratio: 6
VLDL: 36 mg/dL (ref 0.0–40.0)

## 2016-07-29 LAB — CBC WITH DIFFERENTIAL/PLATELET
BASOS PCT: 0.9 % (ref 0.0–3.0)
Basophils Absolute: 0.1 10*3/uL (ref 0.0–0.1)
EOS ABS: 0.8 10*3/uL — AB (ref 0.0–0.7)
Eosinophils Relative: 11.8 % — ABNORMAL HIGH (ref 0.0–5.0)
HCT: 41.3 % (ref 39.0–52.0)
Hemoglobin: 14.3 g/dL (ref 13.0–17.0)
Lymphocytes Relative: 16 % (ref 12.0–46.0)
Lymphs Abs: 1 10*3/uL (ref 0.7–4.0)
MCHC: 34.6 g/dL (ref 30.0–36.0)
MCV: 95.4 fl (ref 78.0–100.0)
MONO ABS: 0.4 10*3/uL (ref 0.1–1.0)
Monocytes Relative: 6.3 % (ref 3.0–12.0)
NEUTROS ABS: 4.2 10*3/uL (ref 1.4–7.7)
Neutrophils Relative %: 65 % (ref 43.0–77.0)
Platelets: 137 10*3/uL — ABNORMAL LOW (ref 150.0–400.0)
RBC: 4.32 Mil/uL (ref 4.22–5.81)
RDW: 13.5 % (ref 11.5–15.5)
WBC: 6.5 10*3/uL (ref 4.0–10.5)

## 2016-07-29 LAB — TSH: TSH: 0.74 u[IU]/mL (ref 0.35–4.50)

## 2016-07-29 MED ORDER — ROSUVASTATIN CALCIUM 20 MG PO TABS: 20.0000 mg | ORAL_TABLET | Freq: Every day | ORAL | 3 refills | Status: DC

## 2016-07-29 MED ORDER — GABAPENTIN 300 MG PO CAPS: 300.0000 mg | ORAL_CAPSULE | Freq: Three times a day (TID) | ORAL | 1 refills | Status: DC

## 2016-07-29 MED ORDER — PAROXETINE HCL 20 MG PO TABS: 30.0000 mg | ORAL_TABLET | Freq: Every day | ORAL | 3 refills | Status: DC

## 2016-07-29 MED ORDER — LEVOFLOXACIN 500 MG PO TABS: 500.0000 mg | ORAL_TABLET | Freq: Every day | ORAL | 0 refills | Status: AC

## 2016-07-29 MED ORDER — AMLODIPINE BESYLATE 5 MG PO TABS: 5.0000 mg | ORAL_TABLET | Freq: Every day | ORAL | 3 refills | Status: DC

## 2016-07-29 MED ORDER — FLUTICASONE PROPIONATE 50 MCG/ACT NA SUSP: 2.0000 | Freq: Every day | NASAL | 12 refills | Status: DC

## 2016-07-29 MED ORDER — ZOLPIDEM TARTRATE 10 MG PO TABS: 10.0000 mg | ORAL_TABLET | Freq: Every evening | ORAL | 1 refills | Status: DC | PRN

## 2016-07-29 MED ORDER — ASPIRIN EC 81 MG PO TBEC: 81.0000 mg | DELAYED_RELEASE_TABLET | Freq: Every day | ORAL | 11 refills | Status: DC

## 2016-07-29 NOTE — Progress Notes (Signed)
Subjective:    Patient ID: Collin Bridges, male    DOB: Aug 26, 1945, 71 y.o.   MRN: [id number]  HPI  Here for yearly f/u and to establish;  Overall doing ok;  Pt denies Chest pain, worsening SOB, DOE, wheezing, orthopnea, PND, worsening LE edema, palpitations, dizziness or syncope.  Pt denies neurological change such as new headache, facial or extremity weakness.  Pt denies polydipsia, polyuria, or low sugar symptoms. Pt states overall good compliance with treatment and medications, good tolerability, and has been trying to follow appropriate diet.  Pt denies worsening depressive symptoms, suicidal ideation or panic. States paxil works well for him.  No fever, night sweats, wt loss, loss of appetite, or other constitutional symptoms.  Pt states good ability with ADL's, has low fall risk, home safety reviewed and adequate, no other significant changes in hearing or vision, and only occasionally active with exercise.   BP Readings from Last 3 Encounters:  07/29/16 (!) 142/92  02/11/16 132/78  01/06/16 (!) 200/96   Wt Readings from Last 3 Encounters:  07/29/16 184 lb (83.5 kg)  02/11/16 180 lb (81.6 kg)  01/06/16 184 lb (83.5 kg)  incidentally  Here with 2-3 days acute onset fever, facial pain, pressure, headache, general weakness and malaise, and greenish d/c, with mild ST and cough. Past Medical History:  Diagnosis Date  . Allergy    seasonal  . Anxiety   . Depression   . Headache   . Hepatitis C   . Hypertension   . Hypoglycemia    Past Surgical History:  Procedure Laterality Date  . APPENDECTOMY    . TONSILECTOMY, ADENOIDECTOMY, BILATERAL MYRINGOTOMY AND TUBES      reports that he has quit smoking. He has never used smokeless tobacco. He reports that he does not drink alcohol or use drugs. family history includes Hyperlipidemia in his father; Hypertension in his father. Allergies  Allergen Reactions  . Bee Pollen     Nasal congestion    No current outpatient  prescriptions on file prior to visit.   No current facility-administered medications on file prior to visit.    Review of Systems Constitutional: Negative for increased diaphoresis, or other activity, appetite or siginficant weight change other than noted HENT: Negative for worsening hearing loss, ear pain, facial swelling, mouth sores and neck stiffness.   Eyes: Negative for other worsening pain, redness or visual disturbance.  Respiratory: Negative for choking or stridor Cardiovascular: Negative for other chest pain and palpitations.  Gastrointestinal: Negative for worsening diarrhea, blood in stool, or abdominal distention Genitourinary: Negative for hematuria, flank pain or change in urine volume.  Musculoskeletal: Negative for myalgias or other joint complaints.  Skin: Negative for other color change and wound or drainage.  Neurological: Negative for syncope and numbness. other than noted Hematological: Negative for adenopathy. or other swelling Psychiatric/Behavioral: Negative for hallucinations, SI, self-injury, decreased concentration or other worsening agitation.  All other system neg per pt    Objective:   Physical Exam BP (!) 142/92   Pulse 89   Temp 97.6 F (36.4 C)   Wt 184 lb (83.5 kg)   SpO2 98%   BMI 24.95 kg/m  VS noted,  Constitutional: Pt is oriented to person, place, and time. Appears well-developed and well-nourished, in no significant distress Head: Normocephalic and atraumatic  Eyes: Conjunctivae and EOM are normal. Pupils are equal, round, and reactive to light Right Ear: External ear normal.  Left Ear: External ear normal Nose: Nose normal.  Bilat  tm's with mild erythema.  Max sinus areas mild tender.  Pharynx with mild erythema, no exudate Mouth/Throat: Oropharynx is o/w clear and moist  Neck: Normal range of motion. Neck supple. No JVD present. No tracheal deviation present or significant neck LA or mass Cardiovascular: Normal rate, regular rhythm,  normal heart sounds and intact distal pulses.   Pulmonary/Chest: Effort normal and breath sounds without rales or wheezing  Abdominal: Soft. Bowel sounds are normal. NT. No HSM  Musculoskeletal: Normal range of motion. Exhibits no edema Lymphadenopathy: Has no cervical adenopathy.  Neurological: Pt is alert and oriented to person, place, and time. Pt has normal reflexes. No cranial nerve deficit. Motor grossly intact Skin: Skin is warm and dry. No rash noted or new ulcers Psychiatric:  Has normal mood and affect. Behavior is normal.  No other exam findings  Lab Results  Component Value Date   GLUCOSE 96 01/06/2016   NA 142 01/06/2016   K 4.4 01/06/2016   CL 104 01/06/2016   CREATININE 1.16 01/06/2016   BUN 10 01/06/2016   CO2 33 (H) 01/06/2016   TSH 0.89 01/06/2016   PSA 3.00 01/06/2016   Transthoracic Echocardiograph Patient:    Favor, Hackler MR #:       [id number] Study Date: 02/09/2016  LV EF: 65% -   70%  --Study Conclusions - Left ventricle: The cavity size was normal. There was mild   concentric hypertrophy. Systolic function was vigorous. The   estimated ejection fraction was in the range of 65% to 70%. Wall   motion was normal; there were no regional wall motion   abnormalities. Doppler parameters are consistent with abnormal   left ventricular relaxation (grade 1 diastolic dysfunction).   There was no evidence of elevated ventricular filling pressure by   Doppler parameters. - Aortic valve: Possibly bicuspid; mildly thickened, mildly   calcified leaflets. There was mild stenosis. Mean gradient (S):   10 mm Hg. Peak gradient (S): 19 mm Hg. - Aortic root: The aortic root was normal in size. - Ascending aorta: The ascending aorta was normal in size. - Left atrium: The atrium was normal in size. - Right ventricle: The cavity size was normal. Wall thickness was   normal. Systolic function was normal. - Tricuspid valve: There was trivial regurgitation. -  Pulmonary arteries: Systolic pressure was within the normal   range. - Inferior vena cava: The vessel was normal in size. - Pericardium, extracardiac: There was no pericardial effusion.    Assessment & Plan:

## 2016-07-29 NOTE — Assessment & Plan Note (Signed)
Stable, for paxil refill,  to f/u any worsening symptoms or concerns

## 2016-07-29 NOTE — Assessment & Plan Note (Signed)
Cleared with tx in early 90's per pt, will check Hep C quant RNA

## 2016-07-29 NOTE — Addendum Note (Signed)
Addended by: Juliet Rude on: 07/29/2016 10:00 AM   Modules accepted: Orders

## 2016-07-29 NOTE — Assessment & Plan Note (Signed)
Mild to mod, for antibx course,  to f/u any worsening symptoms or concerns 

## 2016-07-29 NOTE — Assessment & Plan Note (Signed)
Mild elev today, appears quite tense/nervous, possibly reactive, will cont same tx

## 2016-07-29 NOTE — Patient Instructions (Addendum)
You had the Prevnar 13 pneumonia shot today  Please start Aspirin 81 mg per day  - VERY important to prevent heart disease and stroke  Please take all new medication as prescribed - the levaquin  Please continue all other medications as before, and refills have been done if requested.  Please have the pharmacy call with any other refills you may need.  Please continue your efforts at being more active, low cholesterol diet, and weight control.  You are otherwise up to date with prevention measures today.  Please keep your appointments with your specialists as you may have planned  Please go to the LAB in the Basement (turn left off the elevator) for the tests to be done today  You will be contacted by phone if any changes need to be made immediately.  Otherwise, you will receive a letter about your results with an explanation, but please check with MyChart first.  Please remember to sign up for MyChart if you have not done so, as this will be important to you in the future with finding out test results, communicating by private email, and scheduling acute appointments online when needed.  If you have Medicare related insurance (such as traditional Medicare, [employer] or [employer], or similar), Please make an appointment at the [facility] with Sharee Pimple, the [employer], for your Wellness Visit in this office, which is a benefit with your insurance.  Please return in 1 year for your yearly visit

## 2016-08-01 LAB — HEPATITIS C RNA QUANTITATIVE
HCV QUANT: NOT DETECTED [IU]/mL
HCV Quantitative Log: <1.18 Log IU/mL

## 2016-08-11 ENCOUNTER — Ambulatory Visit: Payer: Medicare Other | Admitting: Nurse Practitioner

## 2017-08-09 ENCOUNTER — Ambulatory Visit (INDEPENDENT_AMBULATORY_CARE_PROVIDER_SITE_OTHER): Payer: Medicare Other | Admitting: Internal Medicine

## 2017-08-09 ENCOUNTER — Other Ambulatory Visit (INDEPENDENT_AMBULATORY_CARE_PROVIDER_SITE_OTHER): Payer: Medicare Other

## 2017-08-09 ENCOUNTER — Encounter: Payer: Self-pay | Admitting: Internal Medicine

## 2017-08-09 VITALS — BP 210/106 | Ht 72.0 in | Wt 193.0 lb

## 2017-08-09 DIAGNOSIS — R739 Hyperglycemia, unspecified: Secondary | ICD-10-CM | POA: Diagnosis not present

## 2017-08-09 DIAGNOSIS — E785 Hyperlipidemia, unspecified: Secondary | ICD-10-CM

## 2017-08-09 DIAGNOSIS — Z23 Encounter for immunization: Secondary | ICD-10-CM

## 2017-08-09 DIAGNOSIS — I10 Essential (primary) hypertension: Secondary | ICD-10-CM | POA: Diagnosis not present

## 2017-08-09 DIAGNOSIS — N320 Bladder-neck obstruction: Secondary | ICD-10-CM

## 2017-08-09 DIAGNOSIS — E119 Type 2 diabetes mellitus without complications: Secondary | ICD-10-CM | POA: Insufficient documentation

## 2017-08-09 DIAGNOSIS — F419 Anxiety disorder, unspecified: Secondary | ICD-10-CM | POA: Diagnosis not present

## 2017-08-09 HISTORY — DX: Hyperlipidemia, unspecified: E78.5

## 2017-08-09 LAB — CBC WITH DIFFERENTIAL/PLATELET
BASOS PCT: 0.7 % (ref 0.0–3.0)
Basophils Absolute: 0.1 10*3/uL (ref 0.0–0.1)
EOS PCT: 25.1 % — AB (ref 0.0–5.0)
Eosinophils Absolute: 2.2 10*3/uL — ABNORMAL HIGH (ref 0.0–0.7)
HCT: 44.6 % (ref 39.0–52.0)
Hemoglobin: 15.5 g/dL (ref 13.0–17.0)
LYMPHS ABS: 1.5 10*3/uL (ref 0.7–4.0)
Lymphocytes Relative: 16.6 % (ref 12.0–46.0)
MCHC: 34.8 g/dL (ref 30.0–36.0)
MCV: 93.9 fl (ref 78.0–100.0)
Monocytes Absolute: 0.6 10*3/uL (ref 0.1–1.0)
Monocytes Relative: 6.7 % (ref 3.0–12.0)
NEUTROS PCT: 50.9 % (ref 43.0–77.0)
Neutro Abs: 4.5 10*3/uL (ref 1.4–7.7)
Platelets: 149 10*3/uL — ABNORMAL LOW (ref 150.0–400.0)
RBC: 4.74 Mil/uL (ref 4.22–5.81)
RDW: 13.3 % (ref 11.5–15.5)
WBC: 8.9 10*3/uL (ref 4.0–10.5)

## 2017-08-09 LAB — LIPID PANEL
CHOL/HDL RATIO: 5
Cholesterol: 182 mg/dL (ref 0–200)
HDL: 34.6 mg/dL — AB (ref >39.00)
Triglycerides: 401 mg/dL — ABNORMAL HIGH (ref 0.0–149.0)

## 2017-08-09 LAB — LDL CHOLESTEROL, DIRECT: Direct LDL: 89 mg/dL

## 2017-08-09 LAB — BASIC METABOLIC PANEL
BUN: 11 mg/dL (ref 6–23)
CO2: 30 meq/L (ref 19–32)
Calcium: 9.5 mg/dL (ref 8.4–10.5)
Chloride: 104 mEq/L (ref 96–112)
Creatinine, Ser: 1.15 mg/dL (ref 0.40–1.50)
GFR: 66.36 mL/min (ref >60.00)
GLUCOSE: 116 mg/dL — AB (ref 70–99)
POTASSIUM: 4.1 meq/L (ref 3.5–5.1)
SODIUM: 142 meq/L (ref 135–145)

## 2017-08-09 LAB — URINALYSIS, ROUTINE W REFLEX MICROSCOPIC
Bilirubin Urine: NEGATIVE
Hgb urine dipstick: NEGATIVE
KETONES UR: NEGATIVE
LEUKOCYTES UA: NEGATIVE
Nitrite: NEGATIVE
PH: 7.5 (ref 5.0–8.0)
RBC / HPF: NONE SEEN (ref 0–?)
SPECIFIC GRAVITY, URINE: 1.02 (ref 1.000–1.030)
Total Protein, Urine: NEGATIVE
URINE GLUCOSE: NEGATIVE
UROBILINOGEN UA: 0.2 (ref 0.0–1.0)
WBC UA: NONE SEEN (ref 0–?)

## 2017-08-09 LAB — HEMOGLOBIN A1C: Hgb A1c MFr Bld: 6.5 % (ref 4.6–6.5)

## 2017-08-09 LAB — HEPATIC FUNCTION PANEL
ALT: 26 U/L (ref 0–53)
AST: 30 U/L (ref 0–37)
Albumin: 4.7 g/dL (ref 3.5–5.2)
Alkaline Phosphatase: 116 U/L (ref 39–117)
BILIRUBIN TOTAL: 0.6 mg/dL (ref 0.2–1.2)
Bilirubin, Direct: 0.1 mg/dL (ref 0.0–0.3)
Total Protein: 7.8 g/dL (ref 6.0–8.3)

## 2017-08-09 LAB — TSH: TSH: 1.33 u[IU]/mL (ref 0.35–4.50)

## 2017-08-09 LAB — PSA: PSA: 2.96 ng/mL (ref 0.10–4.00)

## 2017-08-09 MED ORDER — ZOLPIDEM TARTRATE 10 MG PO TABS: 10.0000 mg | ORAL_TABLET | Freq: Every evening | ORAL | 1 refills | Status: DC | PRN

## 2017-08-09 MED ORDER — AMLODIPINE BESYLATE 5 MG PO TABS: 5.0000 mg | ORAL_TABLET | Freq: Every day | ORAL | 3 refills | Status: DC

## 2017-08-09 MED ORDER — FLUTICASONE PROPIONATE 50 MCG/ACT NA SUSP: 2.0000 | Freq: Every day | NASAL | 12 refills | Status: DC

## 2017-08-09 MED ORDER — ROSUVASTATIN CALCIUM 20 MG PO TABS: 20.0000 mg | ORAL_TABLET | Freq: Every day | ORAL | 3 refills | Status: DC

## 2017-08-09 MED ORDER — PAROXETINE HCL 20 MG PO TABS: 30.0000 mg | ORAL_TABLET | Freq: Every day | ORAL | 3 refills | Status: DC

## 2017-08-09 NOTE — Progress Notes (Signed)
   Subjective:    Patient ID: Collin Bridges, male    DOB: Jul 15, 1945, 72 y.o.   MRN: [id number]  HPI Here to f/u; overall doing ok,  Pt denies chest pain, increasing sob or doe, wheezing, orthopnea, PND, increased LE swelling, palpitations, dizziness or syncope.  Pt denies new neurological symptoms such as new headache, or facial or extremity weakness or numbness.  Pt denies polydipsia, polyuria, or low sugar episode.  Pt states overall good compliance with meds, mostly trying to follow appropriate diet, with wt overall stable,  but little exercise however.  Has been out of BP meds for several weeks.  Does have several wks ongoing nasal allergy symptoms with clearish congestion, itch and sneezing, without fever, pain, ST, cough, swelling or wheezing, flonase has worked well in the past.   Asks for flu shot, decline tetanus.   Denies worsening depressive symptoms, suicidal ideation, or panic; has ongoing anxiety Past Medical History:  Diagnosis Date  . Allergy    seasonal  . Anxiety   . Depression   . Headache   . Hepatitis C   . HLD (hyperlipidemia) 08/09/2017  . Hypertension   . Hypoglycemia    Past Surgical History:  Procedure Laterality Date  . APPENDECTOMY    . TONSILECTOMY, ADENOIDECTOMY, BILATERAL MYRINGOTOMY AND TUBES      reports that he has quit smoking. He has never used smokeless tobacco. He reports that he does not drink alcohol or use drugs. family history includes Hyperlipidemia in his father; Hypertension in his father. Allergies  Allergen Reactions  . Bee Pollen     Nasal congestion    Current Outpatient Medications on File Prior to Visit  Medication Sig Dispense Refill  . aspirin EC 81 MG tablet Take 1 tablet (81 mg total) by mouth daily. 90 tablet 11  . magnesium 30 MG tablet Take 30 mg by mouth 2 (two) times daily.    . sildenafil (REVATIO) 20 MG tablet      No current facility-administered medications on file prior to visit.    Review of Systems  Constitutional: Negative for other unusual diaphoresis or sweats HENT: Negative for ear discharge or swelling Eyes: Negative for other worsening visual disturbances Respiratory: Negative for stridor or other swelling  Gastrointestinal: Negative for worsening distension or other blood Genitourinary: Negative for retention or other urinary change Musculoskeletal: Negative for other MSK pain or swelling Skin: Negative for color change or other new lesions Neurological: Negative for worsening tremors and other numbness  Psychiatric/Behavioral: Negative for worsening agitation or other fatigue All other system neg per pt    Objective:   Physical Exam BP (!) 210/106   Ht 6' (1.829 m)   Wt 193 lb (87.5 kg)   BMI 26.18 kg/m  VS noted, not ill appearing Constitutional: Pt appears in NAD HENT: Head: NCAT.  Right Ear: External ear normal.  Left Ear: External ear normal.  Eyes: . Pupils are equal, round, and reactive to light. Conjunctivae and EOM are normal Nose: without d/c or deformity Neck: Neck supple. Gross normal ROM Cardiovascular: Normal rate and regular rhythm.   Pulmonary/Chest: Effort normal and breath sounds without rales or wheezing.  Neurological: Pt is alert. At baseline orientation, motor grossly intact Skin: Skin is warm. No rashes, other new lesions, no LE edema Psychiatric: Pt behavior is normal without agitation, nervous mood and affect, not depressed affect  No other exam findings    Assessment & Plan:

## 2017-08-09 NOTE — Patient Instructions (Addendum)

## 2017-08-12 ENCOUNTER — Encounter: Payer: Self-pay | Admitting: Internal Medicine

## 2017-08-12 NOTE — Assessment & Plan Note (Signed)
stable overall by history and exam, recent data reviewed with pt, and pt to continue medical treatment as before,  to f/u any worsening symptoms or concerns Lab Results  Component Value Date   HGBA1C 6.5 08/09/2017

## 2017-08-12 NOTE — Assessment & Plan Note (Signed)
Overall stable,  to f/u any worsening symptoms or concerns, cont same tx

## 2017-08-12 NOTE — Assessment & Plan Note (Signed)
Lab Results  Component Value Date   LDLCALC 178 (H) 07/29/2016  severe last visit, for lower chol diet, for f/u with next labs, declines statin

## 2017-08-12 NOTE — Assessment & Plan Note (Signed)
Severe uncontrolled, out of BP med and noncompliant, o/w stable overall by history and exam, recent data reviewed with pt, and pt to restart medical treatment as before,  to f/u any worsening symptoms or concerns BP Readings from Last 3 Encounters:  08/09/17 (!) 210/106  07/29/16 (!) 142/92  02/11/16 132/78

## 2018-02-05 ENCOUNTER — Other Ambulatory Visit: Payer: Self-pay | Admitting: Internal Medicine

## 2018-02-05 NOTE — Telephone Encounter (Signed)
Routing to dr john, please advise, thanks 

## 2018-02-05 NOTE — Telephone Encounter (Signed)
Done erx 

## 2018-07-30 ENCOUNTER — Other Ambulatory Visit: Payer: Self-pay | Admitting: Internal Medicine

## 2018-07-30 DIAGNOSIS — I10 Essential (primary) hypertension: Secondary | ICD-10-CM

## 2018-08-14 ENCOUNTER — Other Ambulatory Visit: Payer: Self-pay

## 2018-08-14 DIAGNOSIS — I10 Essential (primary) hypertension: Secondary | ICD-10-CM

## 2018-08-14 DIAGNOSIS — F419 Anxiety disorder, unspecified: Secondary | ICD-10-CM

## 2018-08-14 MED ORDER — AMLODIPINE BESYLATE 5 MG PO TABS: 5.0000 mg | ORAL_TABLET | Freq: Every day | ORAL | 0 refills | Status: DC

## 2018-08-14 MED ORDER — PAROXETINE HCL 20 MG PO TABS: 30.0000 mg | ORAL_TABLET | Freq: Every day | ORAL | 0 refills | Status: DC

## 2018-08-15 ENCOUNTER — Ambulatory Visit: Payer: Medicare Other | Admitting: Internal Medicine

## 2018-08-17 ENCOUNTER — Encounter: Payer: Self-pay | Admitting: Internal Medicine

## 2018-09-21 ENCOUNTER — Encounter: Payer: Self-pay | Admitting: Internal Medicine

## 2018-09-21 NOTE — Telephone Encounter (Signed)
[place] for staff to contact pt - ok for virtual visit next week as he is reluctant to come to office in person

## 2018-10-02 ENCOUNTER — Encounter: Payer: Self-pay | Admitting: Internal Medicine

## 2018-10-02 ENCOUNTER — Ambulatory Visit (INDEPENDENT_AMBULATORY_CARE_PROVIDER_SITE_OTHER): Payer: Medicare Other | Admitting: Internal Medicine

## 2018-10-02 VITALS — Ht 72.0 in

## 2018-10-02 DIAGNOSIS — E538 Deficiency of other specified B group vitamins: Secondary | ICD-10-CM | POA: Diagnosis not present

## 2018-10-02 DIAGNOSIS — I10 Essential (primary) hypertension: Secondary | ICD-10-CM

## 2018-10-02 DIAGNOSIS — E785 Hyperlipidemia, unspecified: Secondary | ICD-10-CM

## 2018-10-02 DIAGNOSIS — R739 Hyperglycemia, unspecified: Secondary | ICD-10-CM | POA: Diagnosis not present

## 2018-10-02 DIAGNOSIS — E559 Vitamin D deficiency, unspecified: Secondary | ICD-10-CM | POA: Diagnosis not present

## 2018-10-02 DIAGNOSIS — N320 Bladder-neck obstruction: Secondary | ICD-10-CM | POA: Diagnosis not present

## 2018-10-02 DIAGNOSIS — E611 Iron deficiency: Secondary | ICD-10-CM

## 2018-10-02 NOTE — Patient Instructions (Signed)

## 2018-10-02 NOTE — Progress Notes (Signed)
Patient ID: Collin Bridges, male   DOB: [birth year], 73 y.o.   MRN: [id number]  Virtual Visit via Video Note  I connected with Collin Bridges on 10/02/18 at  9:00 AM EDT by a video enabled telemedicine application and verified that I am speaking with the correct person using two identifiers.  Location: Patient: at [facility] Provider: at office   I discussed the limitations of evaluation and management by telemedicine and the availability of in person appointments. The patient expressed understanding and agreed to proceed.  History of Present Illness: Here to f/u; overall doing ok,  Pt denies chest pain, increasing sob or doe, wheezing, orthopnea, PND, increased LE swelling, palpitations, dizziness or syncope.  Pt denies new neurological symptoms such as new headache, or facial or extremity weakness or numbness.  Pt denies polydipsia, polyuria, or low sugar episode.  Pt states overall good compliance with meds, mostly trying to follow appropriate diet, with wt overall stable,  but little exercise however. No new complaints Past Medical History:  Diagnosis Date  . Allergy    seasonal  . Anxiety   . Depression   . Headache   . Hepatitis C   . HLD (hyperlipidemia) 08/09/2017  . Hypertension   . Hypoglycemia    Past Surgical History:  Procedure Laterality Date  . APPENDECTOMY    . TONSILECTOMY, ADENOIDECTOMY, BILATERAL MYRINGOTOMY AND TUBES      reports that he has quit smoking. He has never used smokeless tobacco. He reports that he does not drink alcohol or use drugs. family history includes Hyperlipidemia in his father; Hypertension in his father. Allergies  Allergen Reactions  . Bee Pollen     Nasal congestion    Current Outpatient Medications on File Prior to Visit  Medication Sig Dispense Refill  . amLODipine (NORVASC) 5 MG tablet Take 1 tablet (5 mg total) by mouth daily. 90 tablet 0  . aspirin EC 81 MG tablet Take 1 tablet (81 mg total) by mouth daily. 90 tablet 11   . fluticasone (FLONASE) 50 MCG/ACT nasal spray Place 2 sprays into both nostrils daily. 16 g 12  . magnesium 30 MG tablet Take 30 mg by mouth 2 (two) times daily.    Marland Kitchen PARoxetine (PAXIL) 20 MG tablet Take 1.5 tablets (30 mg total) by mouth daily. 135 tablet 0  . sildenafil (REVATIO) 20 MG tablet     . zolpidem (AMBIEN) 10 MG tablet TAKE 1 TABLET BY MOUTH AT BEDTIME AS NEEDED FOR SLEEP 90 tablet 1   No current facility-administered medications on file prior to visit.     Observations/Objective: Alert, NAD, appropriate mood and affect, resps normal, cn 2-12 intact, moves all 4s, no visible rash or swelling Lab Results  Component Value Date   WBC 6.4 10/05/2018   HGB 15.0 10/05/2018   HCT 42.9 10/05/2018   PLT 130.0 (L) 10/05/2018   GLUCOSE 196 (H) 10/05/2018   CHOL 215 (H) 10/05/2018   TRIG 297.0 (H) 10/05/2018   HDL 38.50 (L) 10/05/2018   LDLDIRECT 102.0 10/05/2018   LDLCALC 178 (H) 07/29/2016   ALT 20 10/05/2018   AST 25 10/05/2018   NA 140 10/05/2018   K 4.1 10/05/2018   CL 103 10/05/2018   CREATININE 1.26 10/05/2018   BUN 13 10/05/2018   CO2 28 10/05/2018   TSH 0.67 10/05/2018   PSA 2.62 10/05/2018   HGBA1C 7.0 (H) 10/05/2018   Assessment and Plan: See notes  Follow Up Instructions: See notes   I discussed  the assessment and treatment plan with the patient. The patient was provided an opportunity to ask questions and all were answered. The patient agreed with the plan and demonstrated an understanding of the instructions.   The patient was advised to call back or seek an in-person evaluation if the symptoms worsen or if the condition fails to improve as anticipated.  [name], MD

## 2018-10-04 ENCOUNTER — Encounter: Payer: Self-pay | Admitting: Internal Medicine

## 2018-10-05 ENCOUNTER — Other Ambulatory Visit (INDEPENDENT_AMBULATORY_CARE_PROVIDER_SITE_OTHER): Payer: Medicare Other

## 2018-10-05 DIAGNOSIS — E611 Iron deficiency: Secondary | ICD-10-CM | POA: Diagnosis not present

## 2018-10-05 DIAGNOSIS — E559 Vitamin D deficiency, unspecified: Secondary | ICD-10-CM | POA: Diagnosis not present

## 2018-10-05 DIAGNOSIS — E538 Deficiency of other specified B group vitamins: Secondary | ICD-10-CM | POA: Diagnosis not present

## 2018-10-05 DIAGNOSIS — N320 Bladder-neck obstruction: Secondary | ICD-10-CM | POA: Diagnosis not present

## 2018-10-05 DIAGNOSIS — E785 Hyperlipidemia, unspecified: Secondary | ICD-10-CM

## 2018-10-05 DIAGNOSIS — R739 Hyperglycemia, unspecified: Secondary | ICD-10-CM

## 2018-10-05 LAB — CBC WITH DIFFERENTIAL/PLATELET
Basophils Absolute: 0 10*3/uL (ref 0.0–0.1)
Basophils Relative: 0.6 % (ref 0.0–3.0)
Eosinophils Absolute: 0.8 10*3/uL — ABNORMAL HIGH (ref 0.0–0.7)
Eosinophils Relative: 13.1 % — ABNORMAL HIGH (ref 0.0–5.0)
HCT: 42.9 % (ref 39.0–52.0)
Hemoglobin: 15 g/dL (ref 13.0–17.0)
Lymphocytes Relative: 19.6 % (ref 12.0–46.0)
Lymphs Abs: 1.3 10*3/uL (ref 0.7–4.0)
MCHC: 34.9 g/dL (ref 30.0–36.0)
MCV: 94.1 fl (ref 78.0–100.0)
Monocytes Absolute: 0.4 10*3/uL (ref 0.1–1.0)
Monocytes Relative: 6.7 % (ref 3.0–12.0)
Neutro Abs: 3.8 10*3/uL (ref 1.4–7.7)
Neutrophils Relative %: 60 % (ref 43.0–77.0)
Platelets: 130 10*3/uL — ABNORMAL LOW (ref 150.0–400.0)
RBC: 4.56 Mil/uL (ref 4.22–5.81)
RDW: 13 % (ref 11.5–15.5)
WBC: 6.4 10*3/uL (ref 4.0–10.5)

## 2018-10-05 LAB — URINALYSIS, ROUTINE W REFLEX MICROSCOPIC
Bilirubin Urine: NEGATIVE
Hgb urine dipstick: NEGATIVE
Ketones, ur: NEGATIVE
Leukocytes,Ua: NEGATIVE
Nitrite: NEGATIVE
Specific Gravity, Urine: 1.015 (ref 1.000–1.030)
Total Protein, Urine: NEGATIVE
Urine Glucose: >=1000 — AB
Urobilinogen, UA: 0.2 (ref 0.0–1.0)
pH: 7 (ref 5.0–8.0)

## 2018-10-05 LAB — LIPID PANEL
Cholesterol: 215 mg/dL — ABNORMAL HIGH (ref 0–200)
HDL: 38.5 mg/dL — ABNORMAL LOW (ref >39.00)
NonHDL: 176.82
Total CHOL/HDL Ratio: 6
Triglycerides: 297 mg/dL — ABNORMAL HIGH (ref 0.0–149.0)
VLDL: 59.4 mg/dL — ABNORMAL HIGH (ref 0.0–40.0)

## 2018-10-05 LAB — BASIC METABOLIC PANEL
BUN: 13 mg/dL (ref 6–23)
CO2: 28 mEq/L (ref 19–32)
Calcium: 9 mg/dL (ref 8.4–10.5)
Chloride: 103 mEq/L (ref 96–112)
Creatinine, Ser: 1.26 mg/dL (ref 0.40–1.50)
GFR: 56.01 mL/min — ABNORMAL LOW (ref >60.00)
Glucose, Bld: 196 mg/dL — ABNORMAL HIGH (ref 70–99)
Potassium: 4.1 mEq/L (ref 3.5–5.1)
Sodium: 140 mEq/L (ref 135–145)

## 2018-10-05 LAB — IBC PANEL
Iron: 91 ug/dL (ref 42–165)
Saturation Ratios: 26.6 % (ref 20.0–50.0)
Transferrin: 244 mg/dL (ref 212.0–360.0)

## 2018-10-05 LAB — TSH: TSH: 0.67 u[IU]/mL (ref 0.35–4.50)

## 2018-10-05 LAB — PSA: PSA: 2.62 ng/mL (ref 0.10–4.00)

## 2018-10-05 LAB — VITAMIN B12: Vitamin B-12: 327 pg/mL (ref 211–911)

## 2018-10-05 LAB — HEMOGLOBIN A1C: Hgb A1c MFr Bld: 7 % — ABNORMAL HIGH (ref 4.6–6.5)

## 2018-10-05 LAB — HEPATIC FUNCTION PANEL
ALT: 20 U/L (ref 0–53)
AST: 25 U/L (ref 0–37)
Albumin: 4.2 g/dL (ref 3.5–5.2)
Alkaline Phosphatase: 108 U/L (ref 39–117)
Bilirubin, Direct: 0.1 mg/dL (ref 0.0–0.3)
Total Bilirubin: 0.4 mg/dL (ref 0.2–1.2)
Total Protein: 7 g/dL (ref 6.0–8.3)

## 2018-10-05 LAB — VITAMIN D 25 HYDROXY (VIT D DEFICIENCY, FRACTURES): VITD: 30.76 ng/mL (ref 30.00–100.00)

## 2018-10-05 LAB — LDL CHOLESTEROL, DIRECT: Direct LDL: 102 mg/dL

## 2018-10-07 ENCOUNTER — Encounter: Payer: Self-pay | Admitting: Internal Medicine

## 2018-10-07 NOTE — Assessment & Plan Note (Signed)
stable overall by history and exam, recent data reviewed with pt, and pt to continue medical treatment as before,  to f/u any worsening symptoms or concerns, for f/u lab 

## 2018-10-07 NOTE — Assessment & Plan Note (Signed)
stable overall by history and exam, recent data reviewed with pt, and pt to continue medical treatment as before,  to f/u any worsening symptoms or concerns  

## 2018-10-07 NOTE — Assessment & Plan Note (Addendum)
stable overall by history and exam, recent data reviewed with pt, to check BP at home on regular basis, and pt to continue medical treatment as before,  to f/u any worsening symptoms or concerns

## 2018-11-26 ENCOUNTER — Other Ambulatory Visit: Payer: Self-pay | Admitting: Internal Medicine

## 2018-11-26 NOTE — Telephone Encounter (Signed)
Done erx 

## 2018-11-27 ENCOUNTER — Encounter: Payer: Self-pay | Admitting: Internal Medicine

## 2019-02-18 ENCOUNTER — Other Ambulatory Visit: Payer: Self-pay | Admitting: Internal Medicine

## 2019-02-18 ENCOUNTER — Encounter: Payer: Self-pay | Admitting: Internal Medicine

## 2019-02-18 DIAGNOSIS — I10 Essential (primary) hypertension: Secondary | ICD-10-CM

## 2019-02-18 NOTE — Telephone Encounter (Signed)
Done erx 

## 2019-02-19 ENCOUNTER — Encounter: Payer: Self-pay | Admitting: Internal Medicine

## 2019-02-19 DIAGNOSIS — F419 Anxiety disorder, unspecified: Secondary | ICD-10-CM

## 2019-02-19 MED ORDER — PAROXETINE HCL 20 MG PO TABS: 30.0000 mg | ORAL_TABLET | Freq: Every day | ORAL | 1 refills | Status: DC

## 2019-04-22 ENCOUNTER — Encounter: Payer: Self-pay | Admitting: Internal Medicine

## 2019-04-22 DIAGNOSIS — Z20822 Contact with and (suspected) exposure to covid-19: Secondary | ICD-10-CM

## 2019-04-22 DIAGNOSIS — Z20828 Contact with and (suspected) exposure to other viral communicable diseases: Secondary | ICD-10-CM

## 2019-04-22 NOTE — Telephone Encounter (Signed)
Ok to contact pt regarding information sent per [employer], thanks

## 2019-05-17 ENCOUNTER — Encounter: Payer: Self-pay | Admitting: Internal Medicine

## 2019-05-20 ENCOUNTER — Encounter: Payer: Self-pay | Admitting: Internal Medicine

## 2019-05-20 DIAGNOSIS — I10 Essential (primary) hypertension: Secondary | ICD-10-CM

## 2019-05-20 DIAGNOSIS — F419 Anxiety disorder, unspecified: Secondary | ICD-10-CM

## 2019-05-20 MED ORDER — ZOLPIDEM TARTRATE 10 MG PO TABS: 10.0000 mg | ORAL_TABLET | Freq: Every evening | ORAL | 1 refills | Status: DC | PRN

## 2019-05-20 MED ORDER — PAROXETINE HCL 20 MG PO TABS: 30.0000 mg | ORAL_TABLET | Freq: Every day | ORAL | 1 refills | Status: DC

## 2019-05-20 MED ORDER — FLUTICASONE PROPIONATE 50 MCG/ACT NA SUSP: NASAL | 1 refills | Status: DC

## 2019-05-20 MED ORDER — AMLODIPINE BESYLATE 5 MG PO TABS: 5.0000 mg | ORAL_TABLET | Freq: Every day | ORAL | 1 refills | Status: DC

## 2019-05-20 NOTE — Telephone Encounter (Signed)
New pharmacy has been updated.Marland KitchenJohny Bridges

## 2019-05-20 NOTE — Telephone Encounter (Signed)
Updated pharmacy sent maintenance meds pls advise on zolpidem.Marland KitchenJohny Bridges

## 2019-05-27 ENCOUNTER — Encounter: Payer: Self-pay | Admitting: Internal Medicine

## 2019-05-27 MED ORDER — LORATADINE 10 MG PO TABS: 10.0000 mg | ORAL_TABLET | Freq: Every day | ORAL | 3 refills | Status: DC

## 2019-09-30 ENCOUNTER — Encounter: Payer: Self-pay | Admitting: Internal Medicine

## 2019-10-03 ENCOUNTER — Other Ambulatory Visit: Payer: Self-pay | Admitting: Internal Medicine

## 2019-10-03 ENCOUNTER — Encounter: Payer: Self-pay | Admitting: Internal Medicine

## 2019-10-03 ENCOUNTER — Other Ambulatory Visit: Payer: Self-pay

## 2019-10-03 ENCOUNTER — Ambulatory Visit (INDEPENDENT_AMBULATORY_CARE_PROVIDER_SITE_OTHER): Payer: Medicare Other | Admitting: Internal Medicine

## 2019-10-03 VITALS — BP 219/88 | HR 96 | Temp 98.6°F | Ht 72.0 in | Wt 195.0 lb

## 2019-10-03 DIAGNOSIS — R739 Hyperglycemia, unspecified: Secondary | ICD-10-CM | POA: Diagnosis not present

## 2019-10-03 DIAGNOSIS — Z23 Encounter for immunization: Secondary | ICD-10-CM | POA: Diagnosis not present

## 2019-10-03 DIAGNOSIS — I10 Essential (primary) hypertension: Secondary | ICD-10-CM | POA: Diagnosis not present

## 2019-10-03 DIAGNOSIS — E559 Vitamin D deficiency, unspecified: Secondary | ICD-10-CM | POA: Diagnosis not present

## 2019-10-03 DIAGNOSIS — E538 Deficiency of other specified B group vitamins: Secondary | ICD-10-CM | POA: Diagnosis not present

## 2019-10-03 DIAGNOSIS — Z0000 Encounter for general adult medical examination without abnormal findings: Secondary | ICD-10-CM | POA: Diagnosis not present

## 2019-10-03 DIAGNOSIS — E785 Hyperlipidemia, unspecified: Secondary | ICD-10-CM

## 2019-10-03 LAB — CBC WITH DIFFERENTIAL/PLATELET
Basophils Absolute: 0 10*3/uL (ref 0.0–0.1)
Basophils Relative: 0.5 % (ref 0.0–3.0)
Eosinophils Absolute: 0.2 10*3/uL (ref 0.0–0.7)
Eosinophils Relative: 3.2 % (ref 0.0–5.0)
HCT: 43.5 % (ref 39.0–52.0)
Hemoglobin: 15.2 g/dL (ref 13.0–17.0)
Lymphocytes Relative: 14 % (ref 12.0–46.0)
Lymphs Abs: 1 10*3/uL (ref 0.7–4.0)
MCHC: 35 g/dL (ref 30.0–36.0)
MCV: 94.2 fl (ref 78.0–100.0)
Monocytes Absolute: 0.5 10*3/uL (ref 0.1–1.0)
Monocytes Relative: 7.3 % (ref 3.0–12.0)
Neutro Abs: 5.3 10*3/uL (ref 1.4–7.7)
Neutrophils Relative %: 75 % (ref 43.0–77.0)
Platelets: 134 10*3/uL — ABNORMAL LOW (ref 150.0–400.0)
RBC: 4.62 Mil/uL (ref 4.22–5.81)
RDW: 13.2 % (ref 11.5–15.5)
WBC: 7.1 10*3/uL (ref 4.0–10.5)

## 2019-10-03 LAB — BASIC METABOLIC PANEL
BUN: 12 mg/dL (ref 6–23)
CO2: 29 mEq/L (ref 19–32)
Calcium: 9.3 mg/dL (ref 8.4–10.5)
Chloride: 103 mEq/L (ref 96–112)
Creatinine, Ser: 1.29 mg/dL (ref 0.40–1.50)
GFR: 54.36 mL/min — ABNORMAL LOW (ref >60.00)
Glucose, Bld: 191 mg/dL — ABNORMAL HIGH (ref 70–99)
Potassium: 4.5 mEq/L (ref 3.5–5.1)
Sodium: 138 mEq/L (ref 135–145)

## 2019-10-03 LAB — URINALYSIS, ROUTINE W REFLEX MICROSCOPIC
Bilirubin Urine: NEGATIVE
Hgb urine dipstick: NEGATIVE
Ketones, ur: NEGATIVE
Leukocytes,Ua: NEGATIVE
Nitrite: NEGATIVE
RBC / HPF: NONE SEEN (ref 0–?)
Specific Gravity, Urine: 1.015 (ref 1.000–1.030)
Total Protein, Urine: NEGATIVE
Urine Glucose: >=1000 — AB
Urobilinogen, UA: 0.2 (ref 0.0–1.0)
pH: 7 (ref 5.0–8.0)

## 2019-10-03 LAB — HEPATIC FUNCTION PANEL
ALT: 21 U/L (ref 0–53)
AST: 25 U/L (ref 0–37)
Albumin: 4.7 g/dL (ref 3.5–5.2)
Alkaline Phosphatase: 110 U/L (ref 39–117)
Bilirubin, Direct: 0.1 mg/dL (ref 0.0–0.3)
Total Bilirubin: 0.5 mg/dL (ref 0.2–1.2)
Total Protein: 7.3 g/dL (ref 6.0–8.3)

## 2019-10-03 LAB — LDL CHOLESTEROL, DIRECT: Direct LDL: 107 mg/dL

## 2019-10-03 LAB — LIPID PANEL
Cholesterol: 219 mg/dL — ABNORMAL HIGH (ref 0–200)
HDL: 39.9 mg/dL (ref >39.00)
NonHDL: 179.09
Total CHOL/HDL Ratio: 5
Triglycerides: 337 mg/dL — ABNORMAL HIGH (ref 0.0–149.0)
VLDL: 67.4 mg/dL — ABNORMAL HIGH (ref 0.0–40.0)

## 2019-10-03 LAB — VITAMIN D 25 HYDROXY (VIT D DEFICIENCY, FRACTURES): VITD: 32.02 ng/mL (ref 30.00–100.00)

## 2019-10-03 LAB — TSH: TSH: 0.72 u[IU]/mL (ref 0.35–4.50)

## 2019-10-03 LAB — MICROALBUMIN / CREATININE URINE RATIO
Creatinine,U: 69.7 mg/dL
Microalb Creat Ratio: 3.6 mg/g (ref 0.0–30.0)
Microalb, Ur: 2.5 mg/dL — ABNORMAL HIGH (ref 0.0–1.9)

## 2019-10-03 LAB — HEMOGLOBIN A1C: Hgb A1c MFr Bld: 6.7 % — ABNORMAL HIGH (ref 4.6–6.5)

## 2019-10-03 LAB — PSA: PSA: 2.23 ng/mL (ref 0.10–4.00)

## 2019-10-03 LAB — VITAMIN B12: Vitamin B-12: 314 pg/mL (ref 211–911)

## 2019-10-03 MED ORDER — AMLODIPINE BESYLATE 10 MG PO TABS: 10.0000 mg | ORAL_TABLET | Freq: Every day | ORAL | 3 refills | Status: DC

## 2019-10-03 MED ORDER — ATORVASTATIN CALCIUM 10 MG PO TABS: 10.0000 mg | ORAL_TABLET | Freq: Every day | ORAL | 3 refills | Status: DC

## 2019-10-03 NOTE — Assessment & Plan Note (Signed)
Consider statin, declines for now

## 2019-10-03 NOTE — Progress Notes (Signed)
Subjective:    Patient ID: Collin Bridges, male    DOB: 09-Jul-1945, 74 y.o.   MRN: [id number]  HPI   Here for wellness and f/u;  Overall doing ok;  Pt denies Chest pain, worsening SOB, DOE, wheezing, orthopnea, PND, worsening LE edema, palpitations, dizziness or syncope.  Pt denies neurological change such as new headache, facial or extremity weakness.  Pt denies polydipsia, polyuria, or low sugar symptoms. Pt states overall good compliance with treatment and medications, good tolerability, and has been trying to follow appropriate diet.  Pt denies worsening depressive symptoms, suicidal ideation or panic. No fever, night sweats, wt loss, loss of appetite, or other constitutional symptoms.  Pt states good ability with ADL's, has low fall risk, home safety reviewed and adequate, no other significant changes in hearing or vision, and only occasionally active with exercise. No new compalints.  SBP running about 150 at home. Very nervous here to day making it worse Past Medical History:  Diagnosis Date  . Allergy    seasonal  . Anxiety   . Depression   . Headache   . Hepatitis C   . HLD (hyperlipidemia) 08/09/2017  . Hypertension   . Hypoglycemia    Past Surgical History:  Procedure Laterality Date  . APPENDECTOMY    . TONSILECTOMY, ADENOIDECTOMY, BILATERAL MYRINGOTOMY AND TUBES      reports that he has quit smoking. He has never used smokeless tobacco. He reports that he does not drink alcohol or use drugs. family history includes Hyperlipidemia in his father; Hypertension in his father. Allergies  Allergen Reactions  . Bee Pollen     Nasal congestion    Current Outpatient Medications on File Prior to Visit  Medication Sig Dispense Refill  . aspirin EC 81 MG tablet Take 1 tablet (81 mg total) by mouth daily. 90 tablet 11  . fluticasone (FLONASE) 50 MCG/ACT nasal spray 2 SPRAYS IN BOTH NOSTRILS DAILY 48 g 1  . loratadine (CLARITIN) 10 MG tablet Take 1 tablet (10 mg total) by  mouth daily. 90 tablet 3  . PARoxetine (PAXIL) 20 MG tablet Take 1.5 tablets (30 mg total) by mouth daily. 135 tablet 1  . zolpidem (AMBIEN) 10 MG tablet Take 1 tablet (10 mg total) by mouth at bedtime as needed. for sleep 90 tablet 1   No current facility-administered medications on file prior to visit.   Review of Systems All otherwise neg per pt     Objective:   Physical Exam BP (!) 219/88 (BP Location: Left Arm, Patient Position: Sitting, Cuff Size: Large)   Pulse 96   Temp 98.6 F (37 C) (Oral)   Ht 6' (1.829 m)   Wt 195 lb (88.5 kg)   SpO2 94%   BMI 26.45 kg/m  VS noted,  Constitutional: Pt appears in NAD HENT: Head: NCAT.  Right Ear: External ear normal.  Left Ear: External ear normal.  Eyes: . Pupils are equal, round, and reactive to light. Conjunctivae and EOM are normal Nose: without d/c or deformity Neck: Neck supple. Gross normal ROM Cardiovascular: Normal rate and regular rhythm.   Pulmonary/Chest: Effort normal and breath sounds without rales or wheezing.  Abd:  Soft, NT, ND, + BS, no organomegaly Neurological: Pt is alert. At baseline orientation, motor grossly intact Skin: Skin is warm. No rashes, other new lesions, no LE edema Psychiatric: Pt behavior is normal without agitation  All otherwise neg per pt Lab Results  Component Value Date   WBC 6.4 10/05/2018  HGB 15.0 10/05/2018   HCT 42.9 10/05/2018   PLT 130.0 (L) 10/05/2018   GLUCOSE 196 (H) 10/05/2018   CHOL 215 (H) 10/05/2018   TRIG 297.0 (H) 10/05/2018   HDL 38.50 (L) 10/05/2018   LDLDIRECT 102.0 10/05/2018   LDLCALC 178 (H) 07/29/2016   ALT 20 10/05/2018   AST 25 10/05/2018   NA 140 10/05/2018   K 4.1 10/05/2018   CL 103 10/05/2018   CREATININE 1.26 10/05/2018   BUN 13 10/05/2018   CO2 28 10/05/2018   TSH 0.67 10/05/2018   PSA 2.62 10/05/2018   HGBA1C 7.0 (H) 10/05/2018      Assessment & Plan:

## 2019-10-03 NOTE — Assessment & Plan Note (Signed)
Mid to mod elevated, ok for incresaed amlodipine 10 qd, f/u BP at home and next visit

## 2019-10-03 NOTE — Patient Instructions (Addendum)
You had the Tdap tetanus and Pneumovax pneumonia shots today  Ok to increase the amlodipine to 10 mg per day  Please call if you change your mind about the colonoscopy  Please continue all other medications as before, and refills have been done if requested.  Please have the pharmacy call with any other refills you may need.  Please continue your efforts at being more active, low cholesterol diet, and weight control.  You are otherwise up to date with prevention measures today.  Please keep your appointments with your specialists as you may have planned  Please go to the LAB at the blood drawing area for the tests to be done  You will be contacted by phone if any changes need to be made immediately.  Otherwise, you will receive a letter about your results with an explanation, but please check with MyChart first.  Please remember to sign up for MyChart if you have not done so, as this will be important to you in the future with finding out test results, communicating by private email, and scheduling acute appointments online when needed.  Please make an Appointment to return in 6 months, or sooner if needed,

## 2019-10-03 NOTE — Assessment & Plan Note (Signed)
For f/u a1c today, cont wt control and diet

## 2019-10-03 NOTE — Addendum Note (Signed)
Addended by: Cresenciano Lick on: 10/03/2019 09:32 AM   Modules accepted: Orders

## 2019-10-03 NOTE — Addendum Note (Signed)
Addended by: Marijean Heath R on: 10/03/2019 11:07 AM   Modules accepted: Orders

## 2019-10-03 NOTE — Assessment & Plan Note (Signed)

## 2019-10-31 ENCOUNTER — Encounter: Payer: Self-pay | Admitting: Internal Medicine

## 2019-10-31 MED ORDER — ZOLPIDEM TARTRATE 10 MG PO TABS: 10.0000 mg | ORAL_TABLET | Freq: Every evening | ORAL | 1 refills | Status: DC | PRN

## 2019-10-31 NOTE — Telephone Encounter (Signed)
Done erx 

## 2020-01-06 ENCOUNTER — Other Ambulatory Visit: Payer: Self-pay | Admitting: Internal Medicine

## 2020-01-08 ENCOUNTER — Other Ambulatory Visit: Payer: Self-pay | Admitting: Internal Medicine

## 2020-01-08 DIAGNOSIS — F419 Anxiety disorder, unspecified: Secondary | ICD-10-CM

## 2020-01-08 NOTE — Telephone Encounter (Signed)
Please refill as per office routine med refill policy (all routine meds refilled for 3 mo or monthly per pt preference up to one year from last visit, then month to month grace period for 3 mo, then further med refills will have to be denied)  

## 2020-04-07 ENCOUNTER — Ambulatory Visit: Payer: Medicare Other | Admitting: Internal Medicine

## 2020-04-13 ENCOUNTER — Encounter: Payer: Self-pay | Admitting: Family

## 2020-04-13 ENCOUNTER — Telehealth (INDEPENDENT_AMBULATORY_CARE_PROVIDER_SITE_OTHER): Payer: Medicare Other | Admitting: Family

## 2020-04-13 ENCOUNTER — Encounter: Payer: Self-pay | Admitting: Internal Medicine

## 2020-04-13 DIAGNOSIS — J019 Acute sinusitis, unspecified: Secondary | ICD-10-CM

## 2020-04-13 MED ORDER — LEVOFLOXACIN 500 MG PO TABS
500.0000 mg | ORAL_TABLET | Freq: Every day | ORAL | 0 refills | Status: DC
Start: 2020-04-13 — End: 2020-05-22

## 2020-04-13 MED ORDER — BENZONATATE 200 MG PO CAPS
200.0000 mg | ORAL_CAPSULE | Freq: Three times a day (TID) | ORAL | 0 refills | Status: DC | PRN
Start: 2020-04-13 — End: 2020-05-22

## 2020-04-13 NOTE — Telephone Encounter (Signed)
Please assist pt with televisit request

## 2020-04-13 NOTE — Progress Notes (Signed)
Collin Bridges is a 74 y.o. male with the following history as recorded in Epic[employer]:  Patient Active Problem List   Diagnosis Date Noted  . HLD (hyperlipidemia) 08/09/2017  . Hyperglycemia 08/09/2017  . Preventative health care 07/29/2016  . Aortic stenosis, mild 07/29/2016  . Insomnia 07/29/2016  . Erectile dysfunction 03/30/2016  . Anxiety disorder 01/06/2016  . Benign prostatic hyperplasia with urinary frequency 01/06/2016  . Essential hypertension 02/02/2012    Current Outpatient Medications  Medication Sig Dispense Refill  . amLODipine (NORVASC) 10 MG tablet Take 1 tablet (10 mg total) by mouth daily. 90 tablet 3  . aspirin EC 81 MG tablet Take 1 tablet (81 mg total) by mouth daily. 90 tablet 11  . atorvastatin (LIPITOR) 10 MG tablet Take 1 tablet (10 mg total) by mouth daily. 90 tablet 3  . benzonatate (TESSALON) 200 MG capsule Take 1 capsule (200 mg total) by mouth 3 (three) times daily as needed for cough. 30 capsule 0  . fluticasone (FLONASE) 50 MCG/ACT nasal spray USE 2 SPRAYS IN EACH NOSTRIL DAILY 48 g 3  . levofloxacin (LEVAQUIN) 500 MG tablet Take 1 tablet (500 mg total) by mouth daily. 10 tablet 0  . loratadine (CLARITIN) 10 MG tablet Take 1 tablet (10 mg total) by mouth daily. 90 tablet 3  . PARoxetine (PAXIL) 20 MG tablet TAKE ONE AND ONE-HALF TABLETS DAILY 135 tablet 3  . zolpidem (AMBIEN) 10 MG tablet Take 1 tablet (10 mg total) by mouth at bedtime as needed. for sleep 90 tablet 1   No current facility-administered medications for this visit.    Allergies: Bee pollen  Past Medical History:  Diagnosis Date  . Allergy    seasonal  . Anxiety   . Depression   . Headache   . Hepatitis C   . HLD (hyperlipidemia) 08/09/2017  . Hypertension   . Hypoglycemia     Past Surgical History:  Procedure Laterality Date  . APPENDECTOMY    . TONSILECTOMY, ADENOIDECTOMY, BILATERAL MYRINGOTOMY AND TUBES      Family History  Problem Relation Age of Onset  .  Hyperlipidemia Father   . Hypertension Father     Social History   Tobacco Use  . Smoking status: Former Research scientist (life sciences)  . Smokeless tobacco: Never Used  . Tobacco comment: quit 71yrs ago, smoke a cigar occassionally.  Substance Use Topics  . Alcohol use: No    Subjective:   I connected with Collin Bridges on 04/13/20 at  9:40 AM EST by a telephone call and verified that I am speaking with the correct person using two identifiers.   I discussed the limitations of evaluation and management by telemedicine and the availability of in person appointments. The patient expressed understanding and agreed to proceed. Provider in office/ patient is at home; provider and patient are only 2 people on telephone call.   Concerned for sinus infection; symptoms x 2-3 weeks; prone to get them but with regular use of Flonase and Elder Love Pot has not had infection since 2018; requesting prescription for Levaquin as this is the only medication that works for him;    Objective:  There were no vitals filed for this visit.  Lungs: Respirations unlabored;  Neurologic: Alert and oriented; speech intact;   Assessment:  1. Acute sinusitis, recurrence not specified, unspecified location     Plan:  Rx for Levaquin 500 mg qd x 10 days, Tessalon Perles tid prn for cough; increase fluids, rest and follow-up worse, no better.  Time spent 12 minutes  No follow-ups on file.  No orders of the defined types were placed in this encounter.   Requested Prescriptions   Signed Prescriptions Disp Refills  . levofloxacin (LEVAQUIN) 500 MG tablet 10 tablet 0    Sig: Take 1 tablet (500 mg total) by mouth daily.  . benzonatate (TESSALON) 200 MG capsule 30 capsule 0    Sig: Take 1 capsule (200 mg total) by mouth 3 (three) times daily as needed for cough.

## 2020-05-16 DIAGNOSIS — I671 Cerebral aneurysm, nonruptured: Secondary | ICD-10-CM

## 2020-05-16 HISTORY — DX: Cerebral aneurysm, nonruptured: I67.1

## 2020-05-18 ENCOUNTER — Encounter: Payer: Self-pay | Admitting: Internal Medicine

## 2020-05-19 ENCOUNTER — Telehealth: Payer: Self-pay

## 2020-05-19 NOTE — Telephone Encounter (Signed)
No med refills just adding a mail order pharmacy. Dm/cma

## 2020-05-22 ENCOUNTER — Encounter: Payer: Self-pay | Admitting: Internal Medicine

## 2020-05-22 ENCOUNTER — Other Ambulatory Visit: Payer: Self-pay

## 2020-05-22 ENCOUNTER — Ambulatory Visit (INDEPENDENT_AMBULATORY_CARE_PROVIDER_SITE_OTHER): Payer: Medicare (Managed Care) | Admitting: Internal Medicine

## 2020-05-22 VITALS — BP 150/86 | HR 110 | Temp 98.7°F | Ht 72.0 in | Wt 196.0 lb

## 2020-05-22 DIAGNOSIS — E785 Hyperlipidemia, unspecified: Secondary | ICD-10-CM

## 2020-05-22 DIAGNOSIS — E559 Vitamin D deficiency, unspecified: Secondary | ICD-10-CM | POA: Diagnosis not present

## 2020-05-22 DIAGNOSIS — E1165 Type 2 diabetes mellitus with hyperglycemia: Secondary | ICD-10-CM

## 2020-05-22 DIAGNOSIS — Z0001 Encounter for general adult medical examination with abnormal findings: Secondary | ICD-10-CM

## 2020-05-22 DIAGNOSIS — Z0000 Encounter for general adult medical examination without abnormal findings: Secondary | ICD-10-CM

## 2020-05-22 DIAGNOSIS — E538 Deficiency of other specified B group vitamins: Secondary | ICD-10-CM | POA: Diagnosis not present

## 2020-05-22 DIAGNOSIS — F419 Anxiety disorder, unspecified: Secondary | ICD-10-CM

## 2020-05-22 DIAGNOSIS — I10 Essential (primary) hypertension: Secondary | ICD-10-CM

## 2020-05-22 LAB — MICROALBUMIN / CREATININE URINE RATIO
Creatinine,U: 67.8 mg/dL
Microalb Creat Ratio: 4.2 mg/g (ref 0.0–30.0)
Microalb, Ur: 2.8 mg/dL — ABNORMAL HIGH (ref 0.0–1.9)

## 2020-05-22 LAB — CBC WITH DIFFERENTIAL/PLATELET
Basophils Absolute: 0 10*3/uL (ref 0.0–0.1)
Basophils Relative: 0.4 % (ref 0.0–3.0)
Eosinophils Absolute: 0.1 10*3/uL (ref 0.0–0.7)
Eosinophils Relative: 1.6 % (ref 0.0–5.0)
HCT: 43.6 % (ref 39.0–52.0)
Hemoglobin: 15.3 g/dL (ref 13.0–17.0)
Lymphocytes Relative: 11.1 % — ABNORMAL LOW (ref 12.0–46.0)
Lymphs Abs: 1 10*3/uL (ref 0.7–4.0)
MCHC: 35.1 g/dL (ref 30.0–36.0)
MCV: 93.2 fl (ref 78.0–100.0)
Monocytes Absolute: 0.6 10*3/uL (ref 0.1–1.0)
Monocytes Relative: 6.5 % (ref 3.0–12.0)
Neutro Abs: 6.9 10*3/uL (ref 1.4–7.7)
Neutrophils Relative %: 80.4 % — ABNORMAL HIGH (ref 43.0–77.0)
Platelets: 132 10*3/uL — ABNORMAL LOW (ref 150.0–400.0)
RBC: 4.68 Mil/uL (ref 4.22–5.81)
RDW: 13 % (ref 11.5–15.5)
WBC: 8.6 10*3/uL (ref 4.0–10.5)

## 2020-05-22 LAB — URINALYSIS, ROUTINE W REFLEX MICROSCOPIC
Bilirubin Urine: NEGATIVE
Hgb urine dipstick: NEGATIVE
Ketones, ur: NEGATIVE
Leukocytes,Ua: NEGATIVE
Nitrite: NEGATIVE
Specific Gravity, Urine: 1.01 (ref 1.000–1.030)
Total Protein, Urine: NEGATIVE
Urine Glucose: >=1000 — AB
Urobilinogen, UA: 0.2 (ref 0.0–1.0)
pH: 7 (ref 5.0–8.0)

## 2020-05-22 LAB — PSA: PSA: 3.06 ng/mL (ref 0.10–4.00)

## 2020-05-22 LAB — BASIC METABOLIC PANEL
BUN: 13 mg/dL (ref 6–23)
CO2: 28 mEq/L (ref 19–32)
Calcium: 9.1 mg/dL (ref 8.4–10.5)
Chloride: 101 mEq/L (ref 96–112)
Creatinine, Ser: 1.25 mg/dL (ref 0.40–1.50)
GFR: 56.45 mL/min — ABNORMAL LOW (ref >60.00)
Glucose, Bld: 238 mg/dL — ABNORMAL HIGH (ref 70–99)
Potassium: 3.8 mEq/L (ref 3.5–5.1)
Sodium: 137 mEq/L (ref 135–145)

## 2020-05-22 LAB — LIPID PANEL
Cholesterol: 238 mg/dL — ABNORMAL HIGH (ref 0–200)
HDL: 42.2 mg/dL (ref >39.00)
Total CHOL/HDL Ratio: 6
Triglycerides: 402 mg/dL — ABNORMAL HIGH (ref 0.0–149.0)

## 2020-05-22 LAB — HEPATIC FUNCTION PANEL
ALT: 22 U/L (ref 0–53)
AST: 25 U/L (ref 0–37)
Albumin: 4.7 g/dL (ref 3.5–5.2)
Alkaline Phosphatase: 129 U/L — ABNORMAL HIGH (ref 39–117)
Bilirubin, Direct: 0.1 mg/dL (ref 0.0–0.3)
Total Bilirubin: 0.5 mg/dL (ref 0.2–1.2)
Total Protein: 7.4 g/dL (ref 6.0–8.3)

## 2020-05-22 LAB — LDL CHOLESTEROL, DIRECT: Direct LDL: 116 mg/dL

## 2020-05-22 LAB — VITAMIN D 25 HYDROXY (VIT D DEFICIENCY, FRACTURES): VITD: 36.06 ng/mL (ref 30.00–100.00)

## 2020-05-22 LAB — TSH: TSH: 0.99 u[IU]/mL (ref 0.35–4.50)

## 2020-05-22 LAB — HEMOGLOBIN A1C: Hgb A1c MFr Bld: 7.1 % — ABNORMAL HIGH (ref 4.6–6.5)

## 2020-05-22 LAB — VITAMIN B12: Vitamin B-12: 299 pg/mL (ref 211–911)

## 2020-05-22 MED ORDER — QUETIAPINE FUMARATE 50 MG PO TABS: 50.0000 mg | ORAL_TABLET | Freq: Two times a day (BID) | ORAL | 3 refills | Status: DC

## 2020-05-22 MED ORDER — PAROXETINE HCL 40 MG PO TABS
40.0000 mg | ORAL_TABLET | ORAL | 3 refills | Status: DC
Start: 2020-05-22 — End: 2020-05-29

## 2020-05-22 MED ORDER — LOSARTAN POTASSIUM 50 MG PO TABS
50.0000 mg | ORAL_TABLET | Freq: Every day | ORAL | 3 refills | Status: DC
Start: 2020-05-22 — End: 2020-05-29

## 2020-05-22 NOTE — Assessment & Plan Note (Signed)
Mod to severe today, for incresaed paxil to 40 mg per day, and add seroquel 50 bid, also declines referral to psychiatry

## 2020-05-22 NOTE — Progress Notes (Signed)
Established Patient Office Visit  Subjective:  Patient ID: Collin Bridges, male    DOB: 1946/03/27  Age: 75 y.o. MRN: [id number]       Chief Complaint: Here for wellness exam and f/u HTn, DM, anxiety, and HLD       HPI:  Collin Bridges is a 75 y.o. male here for wellness overall doing ok;  Pt asks for change of paxil as 40 mg not working as well as used to., very nervous. Was d/c from [employer] years ago for psychiatric illness.   Will not take further covid vaccinations as had chills and felt poorly for the day after the second shot  Also pt will not take lipitor as he read online it can affect the blood sugar, has glucose tablets situated about the hose in case he needs them  Pt denies Chest pain, worsening SOB, DOE, wheezing, orthopnea, PND, worsening LE edema, palpitations, dizziness or syncope.  Pt denies neurological change such as new headache, facial or extremity weakness.  Pt denies polydipsia, polyuria. Trying to be more active but difficult.  Denies worsening depressive symptoms, suicidal ideation, or panic; has ongoing anxiety, some increased recently for unclear reasons.  Due for flu shot Wt Readings from Last 3 Encounters:  05/22/20 196 lb (88.9 kg)  10/03/19 195 lb (88.5 kg)  08/09/17 193 lb (87.5 kg)   BP Readings from Last 3 Encounters:  05/22/20 (!) 150/86  10/03/19 (!) 219/88  08/09/17 (!) 210/106    Past Medical History:  Diagnosis Date  . Allergy    seasonal  . Anxiety   . Depression   . Headache   . Hepatitis C   . HLD (hyperlipidemia) 08/09/2017  . Hypertension   . Hypoglycemia    Past Surgical History:  Procedure Laterality Date  . APPENDECTOMY    . TONSILECTOMY, ADENOIDECTOMY, BILATERAL MYRINGOTOMY AND TUBES      reports that he has quit smoking. He has never used smokeless tobacco. He reports that he does not drink alcohol and does not use drugs. family history includes Hyperlipidemia in his father; Hypertension in his father. Allergies   Allergen Reactions  . Bee Pollen     Nasal congestion    Current Outpatient Medications on File Prior to Visit  Medication Sig Dispense Refill  . amLODipine (NORVASC) 10 MG tablet Take 1 tablet (10 mg total) by mouth daily. 90 tablet 3  . fluticasone (FLONASE) 50 MCG/ACT nasal spray USE 2 SPRAYS IN EACH NOSTRIL DAILY 48 g 3  . loratadine (CLARITIN) 10 MG tablet Take 1 tablet (10 mg total) by mouth daily. 90 tablet 3  . zolpidem (AMBIEN) 10 MG tablet Take 1 tablet (10 mg total) by mouth at bedtime as needed. for sleep 90 tablet 1   No current facility-administered medications on file prior to visit.        ROS:  All others reviewed and negative.  Objective        PE:  BP (!) 150/86 (BP Location: Left Arm, Patient Position: Sitting, Cuff Size: Large)   Pulse (!) 110   Temp 98.7 F (37.1 C) (Oral)   Ht 6' (1.829 m)   Wt 196 lb (88.9 kg)   SpO2 96%   BMI 26.58 kg/m                 Constitutional: Pt appears in NAD               HENT: Head: NCAT.  Right Ear: External ear normal.                 Left Ear: External ear normal.                Eyes: . Pupils are equal, round, and reactive to light. Conjunctivae and EOM are normal               Nose: without d/c or deformity               Neck: Neck supple. Gross normal ROM               Cardiovascular: Normal rate and regular rhythm.                 Pulmonary/Chest: Effort normal and breath sounds without rales or wheezing.                Abd:  Soft, NT, ND, + BS, no organomegaly               Neurological: Pt is alert. At baseline orientation, motor grossly intact               Skin: Skin is warm. No rashes, no other new lesions, LE edema - none               Psychiatric: Pt behavior is normal without agitation but marked nervous  Assessment/Plan:  Collin Bridges is a 75 y.o. White or Caucasian [1] male with  has a past medical history of Allergy, Anxiety, Depression, Headache, Hepatitis C, HLD  (hyperlipidemia) (08/09/2017), Hypertension, and Hypoglycemia.  Assessment Plan  See notes Labs reviewed for each problem: Lab Results  Component Value Date   WBC 8.6 05/22/2020   HGB 15.3 05/22/2020   HCT 43.6 05/22/2020   PLT 132.0 (L) 05/22/2020   GLUCOSE 238 (H) 05/22/2020   CHOL 238 (H) 05/22/2020   TRIG (H) 05/22/2020    402.0 Triglyceride is over 400; calculations on Lipids are invalid.   HDL 42.20 05/22/2020   LDLDIRECT 116.0 05/22/2020   LDLCALC 178 (H) 07/29/2016   ALT 22 05/22/2020   AST 25 05/22/2020   NA 137 05/22/2020   K 3.8 05/22/2020   CL 101 05/22/2020   CREATININE 1.25 05/22/2020   BUN 13 05/22/2020   CO2 28 05/22/2020   TSH 0.99 05/22/2020   PSA 3.06 05/22/2020   HGBA1C 7.1 (H) 05/22/2020   MICROALBUR 2.8 (H) 05/22/2020    Micro: none  Cardiac tracings I have personally interpreted today:  none  Pertinent Radiological findings (summarize): none    Health Maintenance Due  Topic Date Due  . FOOT EXAM  Never done  . OPHTHALMOLOGY EXAM  Never done    There are no preventive care reminders to display for this patient.  Lab Results  Component Value Date   TSH 0.99 05/22/2020   Lab Results  Component Value Date   WBC 8.6 05/22/2020   HGB 15.3 05/22/2020   HCT 43.6 05/22/2020   MCV 93.2 05/22/2020   PLT 132.0 (L) 05/22/2020   Lab Results  Component Value Date   NA 137 05/22/2020   K 3.8 05/22/2020   CO2 28 05/22/2020   GLUCOSE 238 (H) 05/22/2020   BUN 13 05/22/2020   CREATININE 1.25 05/22/2020   BILITOT 0.5 05/22/2020   ALKPHOS 129 (H) 05/22/2020   AST 25 05/22/2020   ALT 22 05/22/2020   PROT 7.4 05/22/2020   ALBUMIN 4.7 05/22/2020  CALCIUM 9.1 05/22/2020   GFR 56.45 (L) 05/22/2020   Lab Results  Component Value Date   CHOL 238 (H) 05/22/2020   Lab Results  Component Value Date   HDL 42.20 05/22/2020   Lab Results  Component Value Date   LDLCALC 178 (H) 07/29/2016   Lab Results  Component Value Date   TRIG (H)  05/22/2020    402.0 Triglyceride is over 400; calculations on Lipids are invalid.   Lab Results  Component Value Date   CHOLHDL 6 05/22/2020   Lab Results  Component Value Date   HGBA1C 7.1 (H) 05/22/2020      Assessment & Plan:   Problem List Items Addressed This Visit      High   Encounter for well adult exam with abnormal findings - Primary    Overall doing well, age appropriate education and counseling updated, referrals for preventative services and immunizations addressed, dietary and smoking counseling addressed, most recent labs reviewed.  I have personally reviewed and have noted:  1) the patient's medical and social history 2) The pt's use of alcohol, tobacco, and illicit drugs 3) The patient's current medications and supplements 4) Functional ability including ADL's, fall risk, home safety risk, hearing and visual impairment 5) Diet and physical activities 6) Evidence for depression or mood disorder 7) The patient's height, weight, and BMI have been recorded in the chart  I have made referrals, and provided counseling and education based on review of the above       Relevant Orders   PSA (Completed)     Medium   HLD (hyperlipidemia)    Declines, to continue to follow with lower chol diet      Essential hypertension    BP Readings from Last 3 Encounters:  05/22/20 (!) 150/86  10/03/19 (!) 219/88  08/09/17 (!) 210/106  uncontrolled, to add losartan 50 qd      Diabetes ([place])    Lab Results  Component Value Date   HGBA1C 7.1 (H) 05/22/2020   Stable, pt to continue current medical treatment  - diet for now   Current Outpatient Medications (Cardiovascular):  .  amLODipine (NORVASC) 10 MG tablet, Take 1 tablet (10 mg total) by mouth daily. Marland Kitchen  losartan (COZAAR) 50 MG tablet, Take 1 tablet (50 mg total) by mouth daily.  Current Outpatient Medications (Respiratory):  .  fluticasone (FLONASE) 50 MCG/ACT nasal spray, USE 2 SPRAYS IN EACH NOSTRIL DAILY .   loratadine (CLARITIN) 10 MG tablet, Take 1 tablet (10 mg total) by mouth daily.    Current Outpatient Medications (Other):  .  zolpidem (AMBIEN) 10 MG tablet, Take 1 tablet (10 mg total) by mouth at bedtime as needed. for sleep .  PARoxetine (PAXIL) 40 MG tablet, Take 1 tablet (40 mg total) by mouth every morning. Marland Kitchen  QUEtiapine (SEROQUEL) 50 MG tablet, Take 1 tablet (50 mg total) by mouth 2 (two) times daily.       Relevant Orders   Microalbumin / creatinine urine ratio (Completed)   Hemoglobin A1c (Completed)   Lipid panel (Completed)   Hepatic function panel (Completed)   CBC with Differential/Platelet (Completed)   TSH (Completed)   Urinalysis, Routine w reflex microscopic (Completed)   Basic metabolic panel (Completed)   Anxiety disorder    Mod to severe today, for incresaed paxil to 40 mg per day, and add seroquel 50 bid, also declines referral to psychiatry       Other Visit Diagnoses    B12 deficiency  Relevant Orders   Vitamin B12 (Completed)   Vitamin D deficiency       Relevant Orders   VITAMIN D 25 Hydroxy (Vit-D Deficiency, Fractures) (Completed)      Meds ordered this encounter  Medications  . DISCONTD: PARoxetine (PAXIL) 40 MG tablet    Sig: Take 1 tablet (40 mg total) by mouth every morning.    Dispense:  90 tablet    Refill:  3  . DISCONTD: QUEtiapine (SEROQUEL) 50 MG tablet    Sig: Take 1 tablet (50 mg total) by mouth 2 (two) times daily.    Dispense:  180 tablet    Refill:  3  . DISCONTD: losartan (COZAAR) 50 MG tablet    Sig: Take 1 tablet (50 mg total) by mouth daily.    Dispense:  90 tablet    Refill:  3    Follow-up: Return in about 6 months (around 11/19/2020).   [name], MD 05/22/2020 9:08 AM [facility]

## 2020-05-22 NOTE — Assessment & Plan Note (Addendum)
BP Readings from Last 3 Encounters:  05/22/20 (!) 150/86  10/03/19 (!) 219/88  08/09/17 (!) 210/106  uncontrolled, to add losartan 50 qd

## 2020-05-22 NOTE — Patient Instructions (Addendum)
You had the flu shot today  Please take all new medication as prescribed - the new seroquel at 50 mg twice per day for nerves, AND the losartan 50 mg per day for blood pressure  Please continue all other medications as before, including the paxil now at 40 mg per day  Please have the pharmacy call with any other refills you may need.  Please continue your efforts at being more active, low cholesterol diet, and weight control.  You are otherwise up to date with prevention measures today.  Please keep your appointments with your specialists as you may have planned  Please go to the LAB at the blood drawing area for the tests to be done  You will be contacted by phone if any changes need to be made immediately.  Otherwise, you will receive a letter about your results with an explanation, but please check with MyChart first.  .Please remember to sign up for MyChart if you have not done so, as this will be important to you in the future with finding out test results, communicating by private email, and scheduling acute appointments online when needed.  Please make an Appointment to return in 6 months, or sooner if needed

## 2020-05-22 NOTE — Assessment & Plan Note (Signed)
Declines, to continue to follow with lower chol diet

## 2020-05-23 ENCOUNTER — Encounter: Payer: Self-pay | Admitting: Internal Medicine

## 2020-05-29 MED ORDER — PAROXETINE HCL 40 MG PO TABS: 40.0000 mg | ORAL_TABLET | ORAL | 3 refills | Status: DC

## 2020-05-29 MED ORDER — QUETIAPINE FUMARATE 50 MG PO TABS: 50.0000 mg | ORAL_TABLET | Freq: Two times a day (BID) | ORAL | 3 refills | Status: DC

## 2020-05-29 MED ORDER — LOSARTAN POTASSIUM 50 MG PO TABS: 50.0000 mg | ORAL_TABLET | Freq: Every day | ORAL | 3 refills | Status: DC

## 2020-05-29 NOTE — Addendum Note (Signed)
Addended by: Hinda Kehr on: 05/29/2020 11:49 AM   Modules accepted: Orders

## 2020-06-01 ENCOUNTER — Encounter: Payer: Self-pay | Admitting: Internal Medicine

## 2020-06-01 NOTE — Assessment & Plan Note (Signed)

## 2020-06-01 NOTE — Assessment & Plan Note (Signed)
Lab Results  Component Value Date   HGBA1C 7.1 (H) 05/22/2020   Stable, pt to continue current medical treatment  - diet for now   Current Outpatient Medications (Cardiovascular):  .  amLODipine (NORVASC) 10 MG tablet, Take 1 tablet (10 mg total) by mouth daily. Marland Kitchen  losartan (COZAAR) 50 MG tablet, Take 1 tablet (50 mg total) by mouth daily.  Current Outpatient Medications (Respiratory):  .  fluticasone (FLONASE) 50 MCG/ACT nasal spray, USE 2 SPRAYS IN EACH NOSTRIL DAILY .  loratadine (CLARITIN) 10 MG tablet, Take 1 tablet (10 mg total) by mouth daily.    Current Outpatient Medications (Other):  .  zolpidem (AMBIEN) 10 MG tablet, Take 1 tablet (10 mg total) by mouth at bedtime as needed. for sleep .  PARoxetine (PAXIL) 40 MG tablet, Take 1 tablet (40 mg total) by mouth every morning. Marland Kitchen  QUEtiapine (SEROQUEL) 50 MG tablet, Take 1 tablet (50 mg total) by mouth 2 (two) times daily.

## 2020-06-03 ENCOUNTER — Encounter: Payer: Self-pay | Admitting: Family

## 2020-06-03 MED ORDER — ZOLPIDEM TARTRATE 10 MG PO TABS: 10.0000 mg | ORAL_TABLET | Freq: Every evening | ORAL | 1 refills | Status: DC | PRN

## 2020-06-30 ENCOUNTER — Telehealth (INDEPENDENT_AMBULATORY_CARE_PROVIDER_SITE_OTHER): Payer: Medicare (Managed Care) | Admitting: Family

## 2020-06-30 DIAGNOSIS — J019 Acute sinusitis, unspecified: Secondary | ICD-10-CM | POA: Diagnosis not present

## 2020-06-30 MED ORDER — LEVOFLOXACIN 500 MG PO TABS
500.0000 mg | ORAL_TABLET | Freq: Every day | ORAL | 0 refills | Status: DC
Start: 2020-06-30 — End: 2021-03-11

## 2020-06-30 NOTE — Progress Notes (Signed)
Collin Bridges is a 75 y.o. male with the following history as recorded in EpicCare:  Patient Active Problem List   Diagnosis Date Noted  . HLD (hyperlipidemia) 08/09/2017  . Diabetes ([place]) 08/09/2017  . Encounter for well adult exam with abnormal findings 07/29/2016  . Aortic stenosis, mild 07/29/2016  . Insomnia 07/29/2016  . Erectile dysfunction 03/30/2016  . Anxiety disorder 01/06/2016  . Benign prostatic hyperplasia with urinary frequency 01/06/2016  . Essential hypertension 02/02/2012    Current Outpatient Medications  Medication Sig Dispense Refill  . amLODipine (NORVASC) 10 MG tablet Take 1 tablet (10 mg total) by mouth daily. 90 tablet 3  . fluticasone (FLONASE) 50 MCG/ACT nasal spray USE 2 SPRAYS IN EACH NOSTRIL DAILY 48 g 3  . loratadine (CLARITIN) 10 MG tablet Take 1 tablet (10 mg total) by mouth daily. 90 tablet 3  . losartan (COZAAR) 50 MG tablet Take 1 tablet (50 mg total) by mouth daily. 90 tablet 3  . PARoxetine (PAXIL) 40 MG tablet Take 1 tablet (40 mg total) by mouth every morning. 90 tablet 3  . QUEtiapine (SEROQUEL) 50 MG tablet Take 1 tablet (50 mg total) by mouth 2 (two) times daily. 180 tablet 3  . zolpidem (AMBIEN) 10 MG tablet Take 1 tablet (10 mg total) by mouth at bedtime as needed. for sleep 90 tablet 1   No current facility-administered medications for this visit.    Allergies: Bee pollen  Past Medical History:  Diagnosis Date  . Allergy    seasonal  . Anxiety   . Depression   . Headache   . Hepatitis C   . HLD (hyperlipidemia) 08/09/2017  . Hypertension   . Hypoglycemia     Past Surgical History:  Procedure Laterality Date  . APPENDECTOMY    . TONSILECTOMY, ADENOIDECTOMY, BILATERAL MYRINGOTOMY AND TUBES      Family History  Problem Relation Age of Onset  . Hyperlipidemia Father   . Hypertension Father     Social History   Tobacco Use  . Smoking status: Former Research scientist (life sciences)  . Smokeless tobacco: Never Used  . Tobacco comment: quit  39yrs ago, smoke a cigar occassionally.  Substance Use Topics  . Alcohol use: No    Subjective:   I connected with Virgel Manifold on 06/30/20 at  9:40 AM EST by telephone call and verified that I am speaking with the correct person using two identifiers.   I discussed the limitations of evaluation and management by telemedicine and the availability of in person appointments. The patient expressed understanding and agreed to proceed. Provider in office/ patient is at home; provider and patient are only 2 people on telephone call.   Patient notes he is prone to recurrent sinus infections; is adamant that he has another infection and is requesting the Levaquin which is what his PCP gives him and "is the only thing that works for me." Denies any exposure or concern for COVID but does agree to home test today; no chest pain or shortness of breath or fever;     Objective:  There were no vitals filed for this visit.   Lungs: Respirations unlabored;  Neurologic: Alert and oriented; speech intact;  Assessment:  1. Acute sinusitis, recurrence not specified, unspecified location     Plan:  Patient agrees to do home COVID test; assuming negative, will call in the Levaquin he has used successfully in the past; needs to see his PCP if symptoms persist.  Time spent 10 minutes  No  follow-ups on file.  No orders of the defined types were placed in this encounter.   Requested Prescriptions    No prescriptions requested or ordered in this encounter

## 2020-09-25 ENCOUNTER — Encounter: Payer: Self-pay | Admitting: Internal Medicine

## 2020-09-25 MED ORDER — ZOLPIDEM TARTRATE 10 MG PO TABS: 10.0000 mg | ORAL_TABLET | Freq: Every evening | ORAL | 1 refills | Status: DC | PRN

## 2020-10-01 NOTE — Telephone Encounter (Signed)
   [facility] calling to report zolpidem (AMBIEN) 10 MG tablet needs prior auth. For Prior auth call [phone] [facility] [phone] option 2 Ref # [id number]

## 2020-10-02 ENCOUNTER — Encounter: Payer: Self-pay | Admitting: Family

## 2020-10-05 ENCOUNTER — Telehealth: Payer: Self-pay

## 2020-10-05 NOTE — Telephone Encounter (Signed)
Key: [id number] ruled favorable.

## 2020-10-06 ENCOUNTER — Encounter: Payer: Self-pay | Admitting: Internal Medicine

## 2020-10-06 MED ORDER — ZOLPIDEM TARTRATE 10 MG PO TABS: 10.0000 mg | ORAL_TABLET | Freq: Every evening | ORAL | 1 refills | Status: DC | PRN

## 2020-11-13 ENCOUNTER — Ambulatory Visit: Payer: 59 | Admitting: Internal Medicine

## 2020-12-09 ENCOUNTER — Telehealth: Payer: Self-pay | Admitting: Internal Medicine

## 2020-12-09 NOTE — Telephone Encounter (Signed)
No, since the risk of covid is now no more than jan 2022 when he was her last, and risk of monkeypox is zero, this is not a valid concern to force me to perform a second rate exam

## 2020-12-09 NOTE — Telephone Encounter (Signed)
Patient is requesting to switch OV @ 8:20am on 08/01 to a VV.. patient does not feel comfortable coming into office because of Honor  Would like a callback once VV has been changed & confirmed [phone]

## 2020-12-10 NOTE — Telephone Encounter (Signed)
Pt made aware that since the risk for catching covid or monkeypox during an OV is so low & he is not having symptoms of either, it is better to do an in person visit for his concerns.  Pt verb understanding.

## 2020-12-14 ENCOUNTER — Encounter: Payer: Self-pay | Admitting: Internal Medicine

## 2020-12-14 ENCOUNTER — Other Ambulatory Visit: Payer: Self-pay

## 2020-12-14 ENCOUNTER — Ambulatory Visit (INDEPENDENT_AMBULATORY_CARE_PROVIDER_SITE_OTHER): Payer: Medicare (Managed Care) | Admitting: Internal Medicine

## 2020-12-14 VITALS — BP 160/82 | HR 100 | Temp 98.5°F | Ht 72.0 in | Wt 194.6 lb

## 2020-12-14 DIAGNOSIS — E7800 Pure hypercholesterolemia, unspecified: Secondary | ICD-10-CM | POA: Diagnosis not present

## 2020-12-14 DIAGNOSIS — E1165 Type 2 diabetes mellitus with hyperglycemia: Secondary | ICD-10-CM | POA: Diagnosis not present

## 2020-12-14 DIAGNOSIS — R972 Elevated prostate specific antigen [PSA]: Secondary | ICD-10-CM | POA: Insufficient documentation

## 2020-12-14 DIAGNOSIS — N183 Chronic kidney disease, stage 3 unspecified: Secondary | ICD-10-CM | POA: Insufficient documentation

## 2020-12-14 DIAGNOSIS — E162 Hypoglycemia, unspecified: Secondary | ICD-10-CM

## 2020-12-14 DIAGNOSIS — I10 Essential (primary) hypertension: Secondary | ICD-10-CM | POA: Diagnosis not present

## 2020-12-14 DIAGNOSIS — N1831 Chronic kidney disease, stage 3a: Secondary | ICD-10-CM | POA: Insufficient documentation

## 2020-12-14 LAB — BASIC METABOLIC PANEL
BUN: 13 mg/dL (ref 6–23)
CO2: 27 mEq/L (ref 19–32)
Calcium: 9.1 mg/dL (ref 8.4–10.5)
Chloride: 103 mEq/L (ref 96–112)
Creatinine, Ser: 1.35 mg/dL (ref 0.40–1.50)
GFR: 51.26 mL/min — ABNORMAL LOW (ref >60.00)
Glucose, Bld: 188 mg/dL — ABNORMAL HIGH (ref 70–99)
Potassium: 4.3 mEq/L (ref 3.5–5.1)
Sodium: 137 mEq/L (ref 135–145)

## 2020-12-14 LAB — HEPATIC FUNCTION PANEL
ALT: 14 U/L (ref 0–53)
AST: 19 U/L (ref 0–37)
Albumin: 4.4 g/dL (ref 3.5–5.2)
Alkaline Phosphatase: 91 U/L (ref 39–117)
Bilirubin, Direct: 0.1 mg/dL (ref 0.0–0.3)
Total Bilirubin: 0.5 mg/dL (ref 0.2–1.2)
Total Protein: 7.2 g/dL (ref 6.0–8.3)

## 2020-12-14 LAB — LDL CHOLESTEROL, DIRECT: Direct LDL: 113 mg/dL

## 2020-12-14 LAB — LIPID PANEL
Cholesterol: 238 mg/dL — ABNORMAL HIGH (ref 0–200)
HDL: 38.2 mg/dL — ABNORMAL LOW (ref >39.00)
Total CHOL/HDL Ratio: 6
Triglycerides: 448 mg/dL — ABNORMAL HIGH (ref 0.0–149.0)

## 2020-12-14 LAB — PSA: PSA: 2.73 ng/mL (ref 0.10–4.00)

## 2020-12-14 LAB — HEMOGLOBIN A1C: Hgb A1c MFr Bld: 6.6 % — ABNORMAL HIGH (ref 4.6–6.5)

## 2020-12-14 NOTE — Assessment & Plan Note (Signed)
Severely fearful given unable to gain wt and presumed low sugars until his 30s; reassured, cont to follow

## 2020-12-14 NOTE — Assessment & Plan Note (Signed)
Lab Results  Component Value Date   HGBA1C 6.6 (H) 12/14/2020   Stable, pt to continue current medical treatment  - diet

## 2020-12-14 NOTE — Assessment & Plan Note (Signed)
Lab Results  Component Value Date   CREATININE 1.35 12/14/2020   Stable overall, cont to avoid nephrotoxins

## 2020-12-14 NOTE — Assessment & Plan Note (Signed)
Lab Results  Component Value Date   LDLCALC 178 (H) 07/29/2016   Uncontrolled, goal ldl < 100, declines statin,

## 2020-12-14 NOTE — Assessment & Plan Note (Signed)
Mild increased velocity, for f/u psa today, asympt,  to f/u any worsening symptoms or concerns

## 2020-12-14 NOTE — Progress Notes (Signed)
Patient ID: Collin Bridges, male   DOB: [birth year], 75 y.o.   MRN: [id number]        Chief Complaint: follow up HTN, HLD and hyperglycemia, ckd, increased psa velocity       HPI:  Collin Bridges is a 75 y.o. male here overall doing ok, was always lean into hi 30's so seems fixated on low sugars; Pt denies chest pain, increased sob or doe, wheezing, orthopnea, PND, increased LE swelling, palpitations, dizziness or syncope.   Pt denies polydipsia, polyuria, or low sugar symptoms or new focal neuro s/s.  Denies urinary symptoms such as dysuria, frequency, urgency, flank pain, hematuria or n/v, fever, chills.  Np other new complaints  bp at home < 140/90.         Wt Readings from Last 3 Encounters:  12/14/20 194 lb 9.6 oz (88.3 kg)  05/22/20 196 lb (88.9 kg)  10/03/19 195 lb (88.5 kg)   BP Readings from Last 3 Encounters:  12/14/20 (!) 160/82  05/22/20 (!) 150/86  10/03/19 (!) 219/88         Past Medical History:  Diagnosis Date   Allergy    seasonal   Anxiety    Depression    Headache    Hepatitis C    HLD (hyperlipidemia) 08/09/2017   Hypertension    Hypoglycemia    Past Surgical History:  Procedure Laterality Date   APPENDECTOMY     TONSILECTOMY, ADENOIDECTOMY, BILATERAL MYRINGOTOMY AND TUBES      reports that he has quit smoking. He has never used smokeless tobacco. He reports that he does not drink alcohol and does not use drugs. family history includes Hyperlipidemia in his father; Hypertension in his father. Allergies  Allergen Reactions   Bee Pollen     Nasal congestion    Current Outpatient Medications on File Prior to Visit  Medication Sig Dispense Refill   amLODipine (NORVASC) 10 MG tablet Take 1 tablet (10 mg total) by mouth daily. 90 tablet 3   fluticasone (FLONASE) 50 MCG/ACT nasal spray USE 2 SPRAYS IN EACH NOSTRIL DAILY 48 g 3   levofloxacin (LEVAQUIN) 500 MG tablet Take 1 tablet (500 mg total) by mouth daily. 7 tablet 0   loratadine (CLARITIN)  10 MG tablet Take 1 tablet (10 mg total) by mouth daily. 90 tablet 3   losartan (COZAAR) 50 MG tablet Take 1 tablet (50 mg total) by mouth daily. 90 tablet 3   PARoxetine (PAXIL) 40 MG tablet Take 1 tablet (40 mg total) by mouth every morning. 90 tablet 3   QUEtiapine (SEROQUEL) 50 MG tablet Take 1 tablet (50 mg total) by mouth 2 (two) times daily. 180 tablet 3   zolpidem (AMBIEN) 10 MG tablet Take 1 tablet (10 mg total) by mouth at bedtime as needed. for sleep 90 tablet 1   No current facility-administered medications on file prior to visit.        ROS:  All others reviewed and negative.  Objective        PE:  BP (!) 160/82 (BP Location: Left Arm, Patient Position: Sitting, Cuff Size: Normal)   Pulse 100   Temp 98.5 F (36.9 C) (Oral)   Ht 6' (1.829 m)   Wt 194 lb 9.6 oz (88.3 kg)   SpO2 98%   BMI 26.39 kg/m                 Constitutional: Pt appears in NAD  HENT: Head: NCAT.                Right Ear: External ear normal.                 Left Ear: External ear normal.                Eyes: . Pupils are equal, round, and reactive to light. Conjunctivae and EOM are normal               Nose: without d/c or deformity               Neck: Neck supple. Gross normal ROM               Cardiovascular: Normal rate and regular rhythm.                 Pulmonary/Chest: Effort normal and breath sounds without rales or wheezing.                Abd:  Soft, NT, ND, + BS, no organomegaly               Neurological: Pt is alert. At baseline orientation, motor grossly intact               Skin: Skin is warm. No rashes, no other new lesions, LE edema - none               Psychiatric: Pt behavior is normal without agitation   Micro: none  Cardiac tracings I have personally interpreted today:  none  Pertinent Radiological findings (summarize): none   Lab Results  Component Value Date   WBC 8.6 05/22/2020   HGB 15.3 05/22/2020   HCT 43.6 05/22/2020   PLT 132.0 (L) 05/22/2020    GLUCOSE 188 (H) 12/14/2020   CHOL 238 (H) 12/14/2020   TRIG (H) 12/14/2020    448.0 Triglyceride is over 400; calculations on Lipids are invalid.   HDL 38.20 (L) 12/14/2020   LDLDIRECT 113.0 12/14/2020   LDLCALC 178 (H) 07/29/2016   ALT 14 12/14/2020   AST 19 12/14/2020   NA 137 12/14/2020   K 4.3 12/14/2020   CL 103 12/14/2020   CREATININE 1.35 12/14/2020   BUN 13 12/14/2020   CO2 27 12/14/2020   TSH 0.99 05/22/2020   PSA 2.73 12/14/2020   HGBA1C 6.6 (H) 12/14/2020   MICROALBUR 2.8 (H) 05/22/2020   Assessment/Plan:  Collin Bridges is a 75 y.o. White or Caucasian [1] male with  has a past medical history of Allergy, Anxiety, Depression, Headache, Hepatitis C, HLD (hyperlipidemia) (08/09/2017), Hypertension, and Hypoglycemia.  Hypoglycemia Severely fearful given unable to gain wt and presumed low sugars until his 63s; reassured, cont to follow  Increased prostate specific antigen (PSA) velocity Mild increased velocity, for f/u psa today, asympt,  to f/u any worsening symptoms or concerns  HLD (hyperlipidemia) Lab Results  Component Value Date   LDLCALC 178 (H) 07/29/2016   Uncontrolled, goal ldl < 100, declines statin,   Diabetes ([place]) Lab Results  Component Value Date   HGBA1C 6.6 (H) 12/14/2020   Stable, pt to continue current medical treatment  - diet   CKD (chronic kidney disease) stage 3, GFR 30-59 ml/min ([facility]) Lab Results  Component Value Date   CREATININE 1.35 12/14/2020   Stable overall, cont to avoid nephrotoxins  Followup: Return in about 6 months (around 06/16/2021).  [name], MD 12/14/2020 11:08 PM [facility]  [facility]

## 2020-12-14 NOTE — Patient Instructions (Signed)
You should check on the shingles shot coverage with your insurance please  Please call if you change your mind about the colonoscopy and eye doctor referrals  Please continue all other medications as before, and refills have been done if requested.  Please have the pharmacy call with any other refills you may need.  Please continue your efforts at being more active, low cholesterol diet, and weight control  Please keep your appointments with your specialists as you may have planned  Please go to the LAB at the blood drawing area for the tests to be done  You will be contacted by phone if any changes need to be made immediately.  Otherwise, you will receive a letter about your results with an explanation, but please check with MyChart first.  Please remember to sign up for MyChart if you have not done so, as this will be important to you in the future with finding out test results, communicating by private email, and scheduling acute appointments online when needed.  Please make an Appointment to return in 6 months, or sooner if needed,

## 2021-01-11 ENCOUNTER — Telehealth: Payer: Self-pay | Admitting: Internal Medicine

## 2021-01-11 NOTE — Telephone Encounter (Signed)
Left message for patient to call me back at [phone] to schedule Medicare Annual Wellness Visit   No hx of AWV eligible as of 02/14/11  Please schedule at anytime with [facility] Advisor if patient calls the office back.    40 Minutes appointment   Any questions, please call me at [phone]

## 2021-01-12 ENCOUNTER — Ambulatory Visit: Payer: Medicare (Managed Care)

## 2021-03-10 ENCOUNTER — Emergency Department (HOSPITAL_COMMUNITY)
Admission: EM | Admit: 2021-03-10 | Discharge: 2021-03-10 | Disposition: A | Discharge status: Home / Self Care | Payer: Medicare (Managed Care) | Attending: Emergency Medicine | Admitting: Emergency Medicine

## 2021-03-10 ENCOUNTER — Other Ambulatory Visit: Payer: Self-pay

## 2021-03-10 ENCOUNTER — Emergency Department (HOSPITAL_COMMUNITY): Payer: Medicare (Managed Care)

## 2021-03-10 ENCOUNTER — Encounter (HOSPITAL_COMMUNITY): Payer: Self-pay

## 2021-03-10 DIAGNOSIS — I129 Hypertensive chronic kidney disease with stage 1 through stage 4 chronic kidney disease, or unspecified chronic kidney disease: Secondary | ICD-10-CM | POA: Diagnosis not present

## 2021-03-10 DIAGNOSIS — Z79899 Other long term (current) drug therapy: Secondary | ICD-10-CM | POA: Insufficient documentation

## 2021-03-10 DIAGNOSIS — W182XXA Fall in (into) shower or empty bathtub, initial encounter: Secondary | ICD-10-CM | POA: Insufficient documentation

## 2021-03-10 DIAGNOSIS — N183 Chronic kidney disease, stage 3 unspecified: Secondary | ICD-10-CM | POA: Diagnosis not present

## 2021-03-10 DIAGNOSIS — R0781 Pleurodynia: Secondary | ICD-10-CM | POA: Diagnosis not present

## 2021-03-10 DIAGNOSIS — Z87891 Personal history of nicotine dependence: Secondary | ICD-10-CM | POA: Insufficient documentation

## 2021-03-10 DIAGNOSIS — S0181XA Laceration without foreign body of other part of head, initial encounter: Secondary | ICD-10-CM | POA: Diagnosis not present

## 2021-03-10 DIAGNOSIS — S0993XA Unspecified injury of face, initial encounter: Secondary | ICD-10-CM | POA: Diagnosis present

## 2021-03-10 DIAGNOSIS — Y92002 Bathroom of unspecified non-institutional (private) residence single-family (private) house as the place of occurrence of the external cause: Secondary | ICD-10-CM | POA: Insufficient documentation

## 2021-03-10 DIAGNOSIS — S0191XA Laceration without foreign body of unspecified part of head, initial encounter: Secondary | ICD-10-CM

## 2021-03-10 IMAGING — CT CT HEAD W/O CM
3 series · 15 of 47 positions shown, 18 images · non-contrast
Comparison: None.

CLINICAL DATA: Status post fall.

EXAM:
CT HEAD WITHOUT CONTRAST
TECHNIQUE: Contiguous axial images were obtained from the base of the skull
through the vertex without intravenous contrast.

[Series 2: head wo · axial · 0.47mm/px · z∈[-84,+41]mm · 9 of 31 slices shown, 12 images]
[im 3/31  brain]
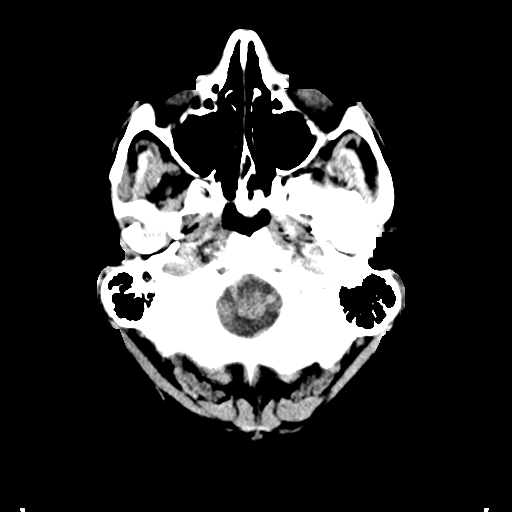
[im 3/31  bone]
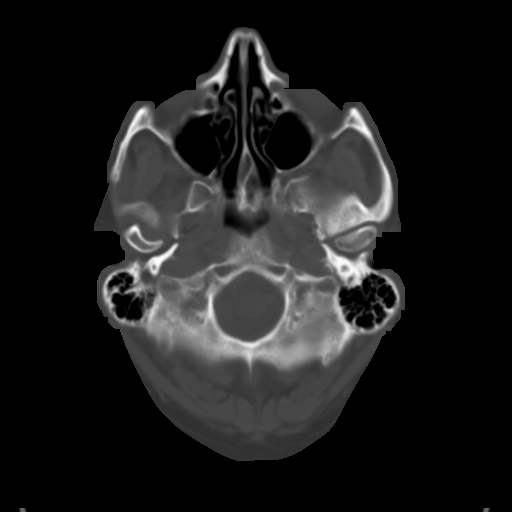
[im 6/31  brain]
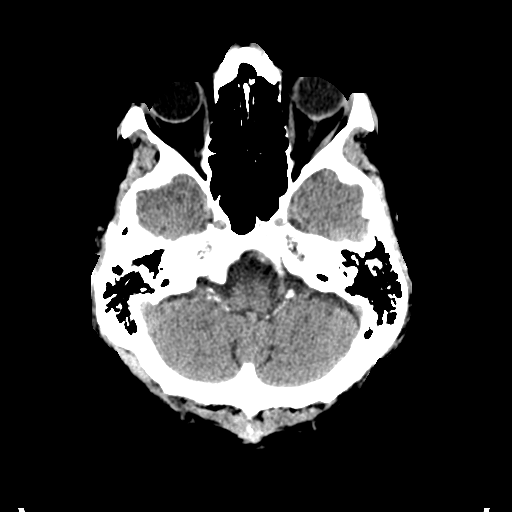
[im 9/31  brain]
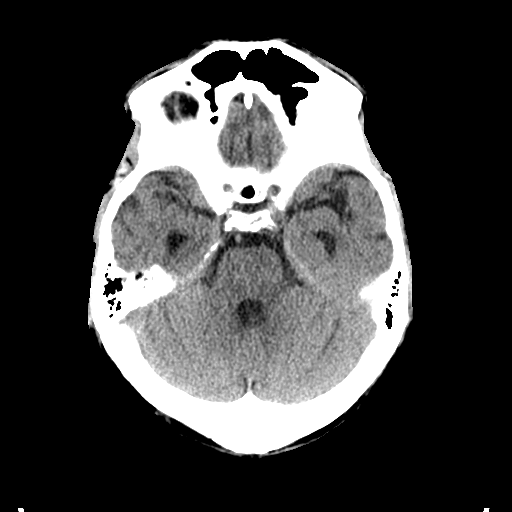
[im 12/31  brain]
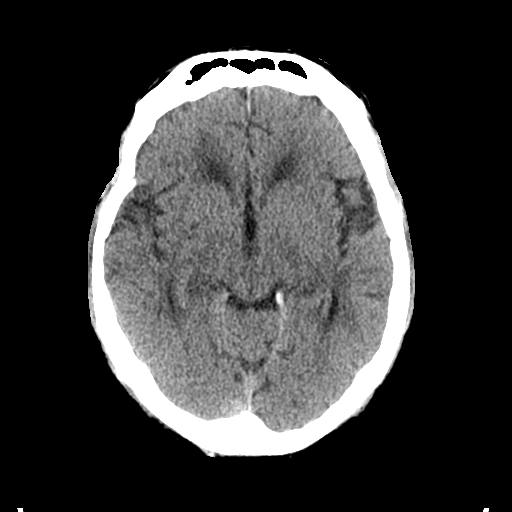
[im 16/31  brain]
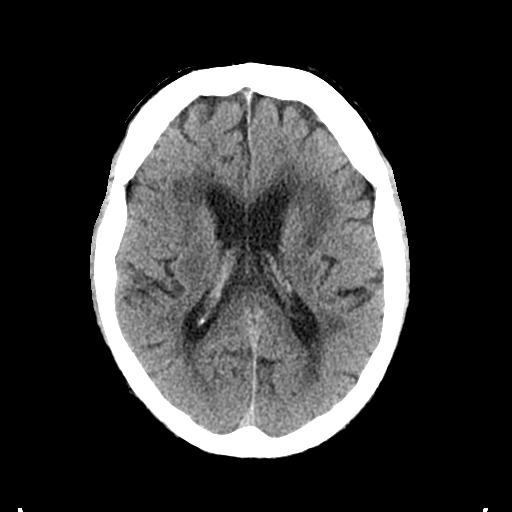
[im 16/31  bone]
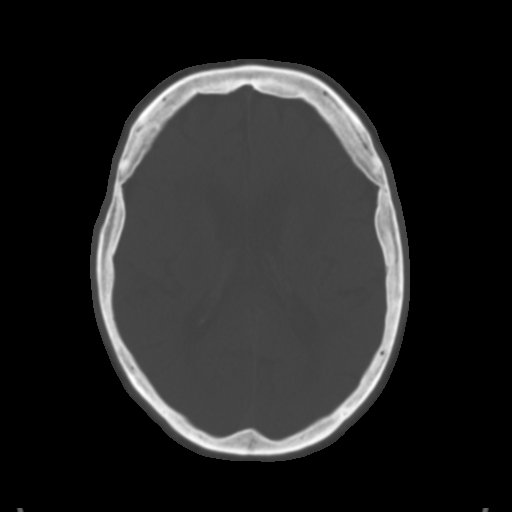
[im 19/31  brain]
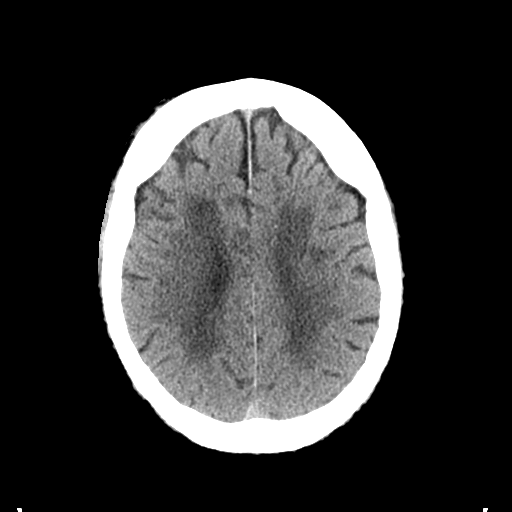
[im 22/31  brain]
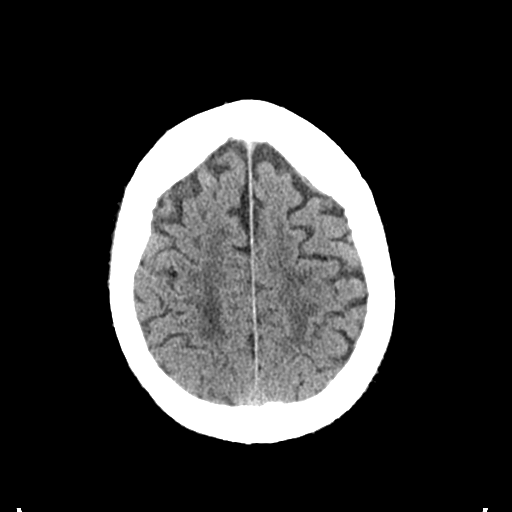
[im 25/31  brain]
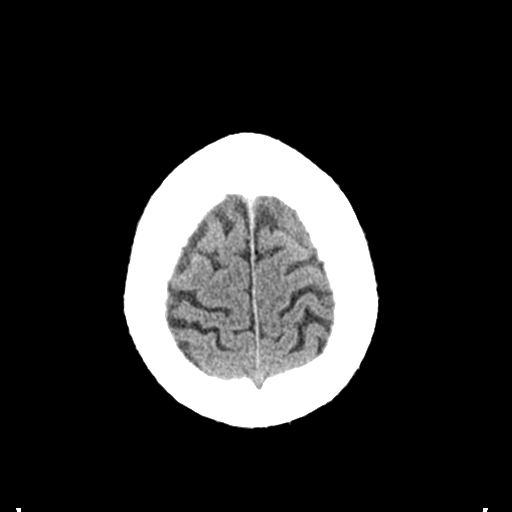
[im 28/31  brain]
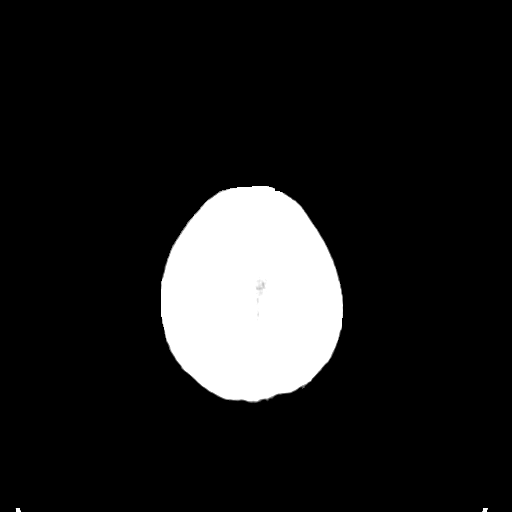
[im 28/31  bone]
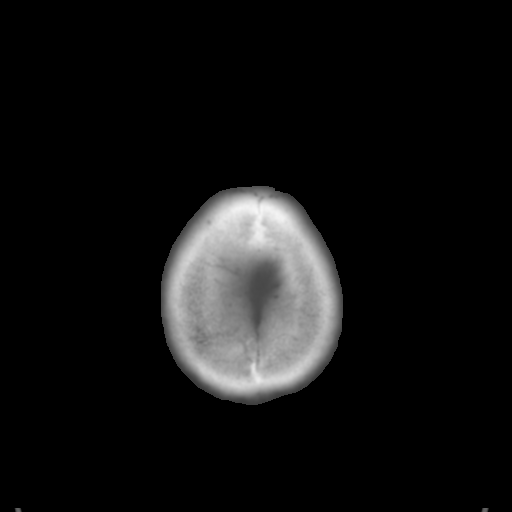

[Series 4: coronal soft tissue · coronal · 0.30mm/px · 3 of 69 slices shown]
[im 23/69  brain]
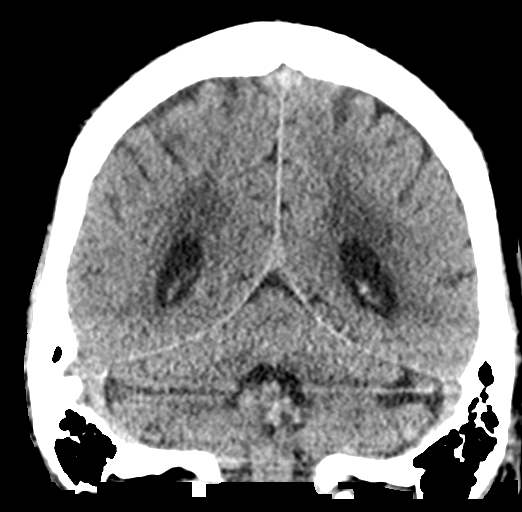
[im 31/69  brain]
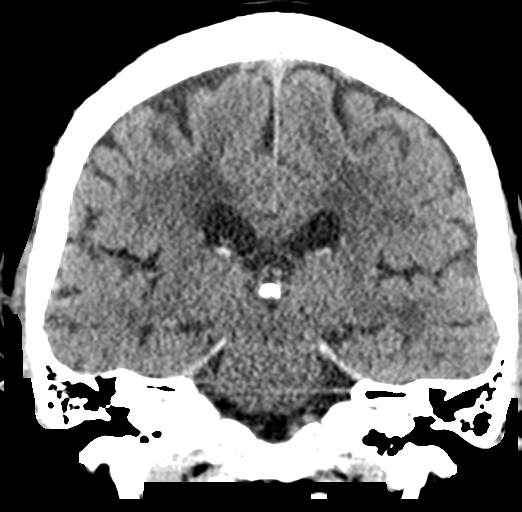
[im 38/69  brain]
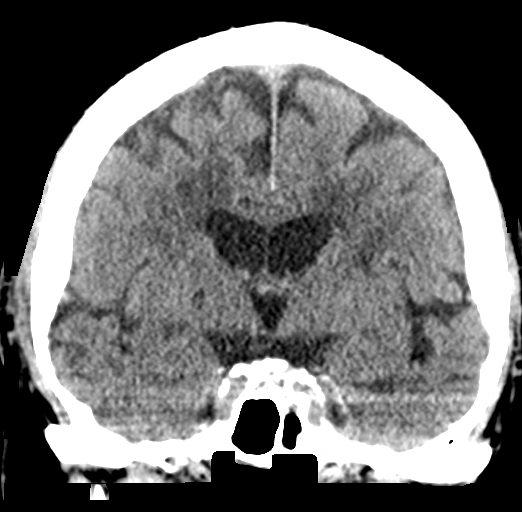

[Series 5: sagittal soft tissue · sagittal · 0.30mm/px · 3 of 53 slices shown]
[im 18/53  brain]
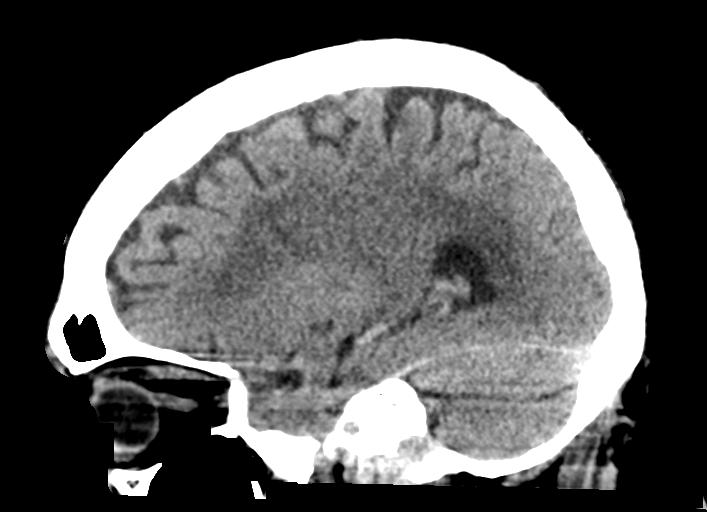
[im 27/53  brain]
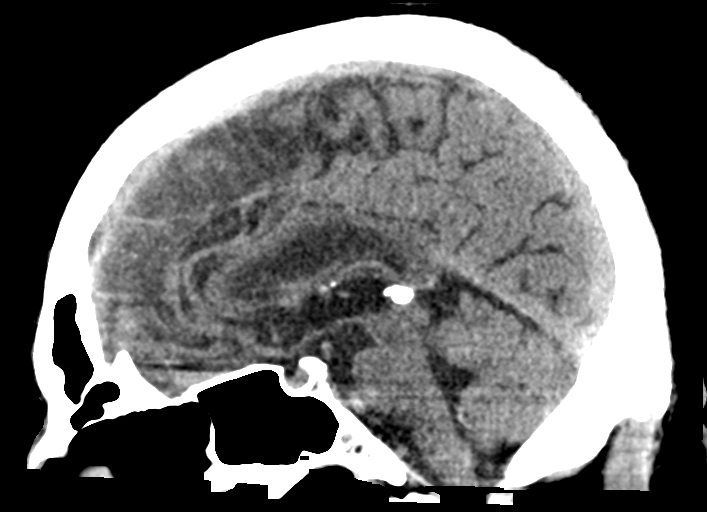
[im 35/53  brain]
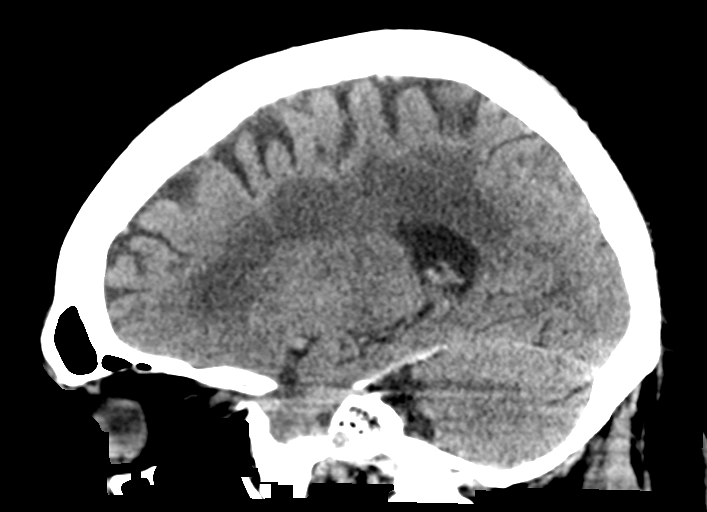

[15 of 47 positions shown; findings below may reference images not displayed]

FINDINGS: Brain: There is mild cerebral atrophy with widening of the
extra-axial spaces and ventricular dilatation.
There are areas of decreased attenuation within the white matter
tracts of the supratentorial brain, consistent with microvascular
disease changes.

There is a small chronic right basal ganglia lacunar infarct.

A 12 mm x 7 mm x 9 mm partially calcified area of extra-axial
increased attenuation is seen below the anterior aspect of the
tentorium on the left (axial CT image 11, CT series 2).

Vascular: No hyperdense vessels are identified.

Skull: Normal. Negative for fracture or focal lesion.

Sinuses/Orbits: No acute finding.

Other: A small right frontal scalp soft tissue defect is seen.
IMPRESSION: 1. Findings likely consistent with a small meningioma below the
anterior aspect of the tentorium on the left. MRI correlation is
recommended.
2. Small right frontal scalp soft tissue defect.
3. No acute intracranial abnormality.

## 2021-03-10 IMAGING — CR DG RIBS W/ CHEST 3+V*L*
4 series · 4 of 4 positions shown · non-contrast
Comparison: [DATE]

CLINICAL DATA: Status post fall.

EXAM:
LEFT RIBS AND CHEST - 3+ VIEW

[w chest pa (1 of 3)]
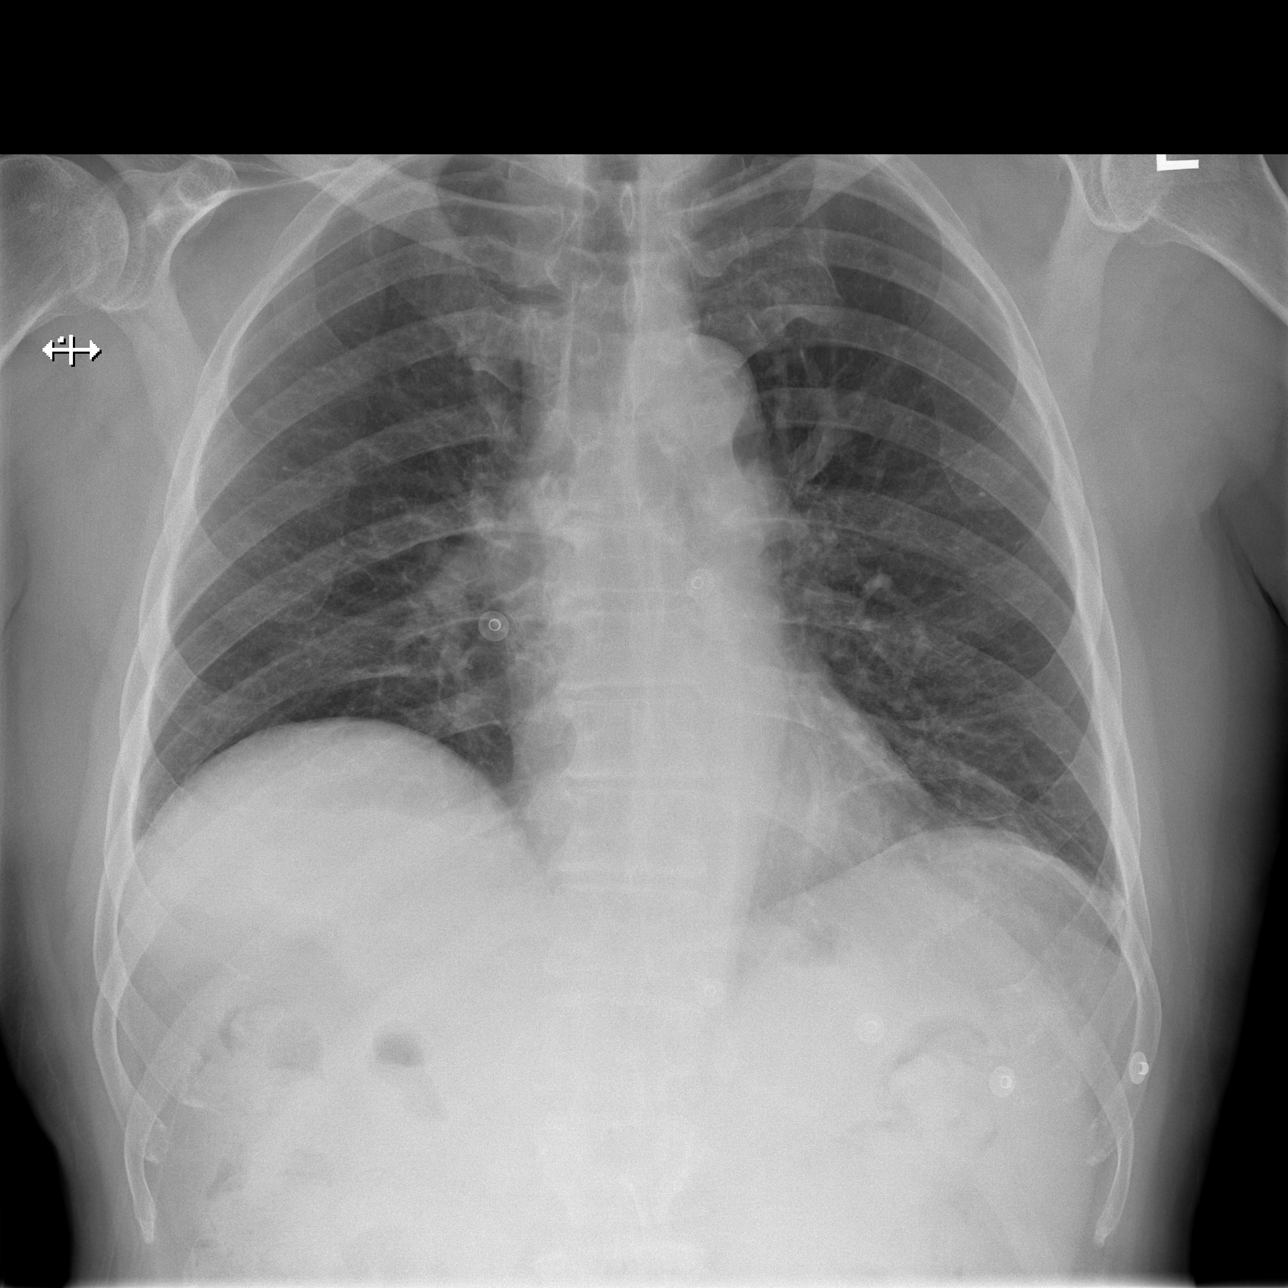

[w chest pa (2 of 3)]
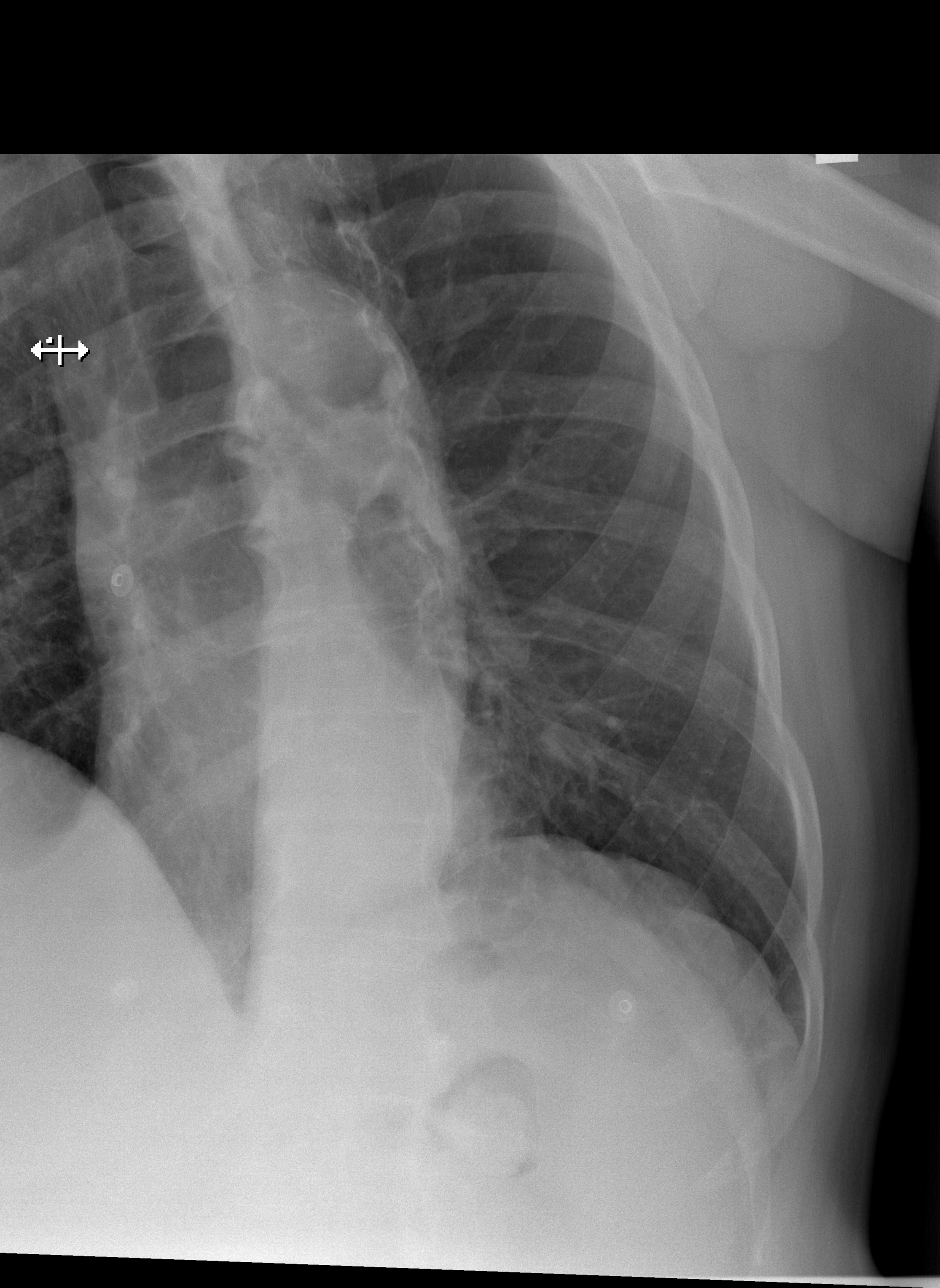

[w chest pa (3 of 3)]
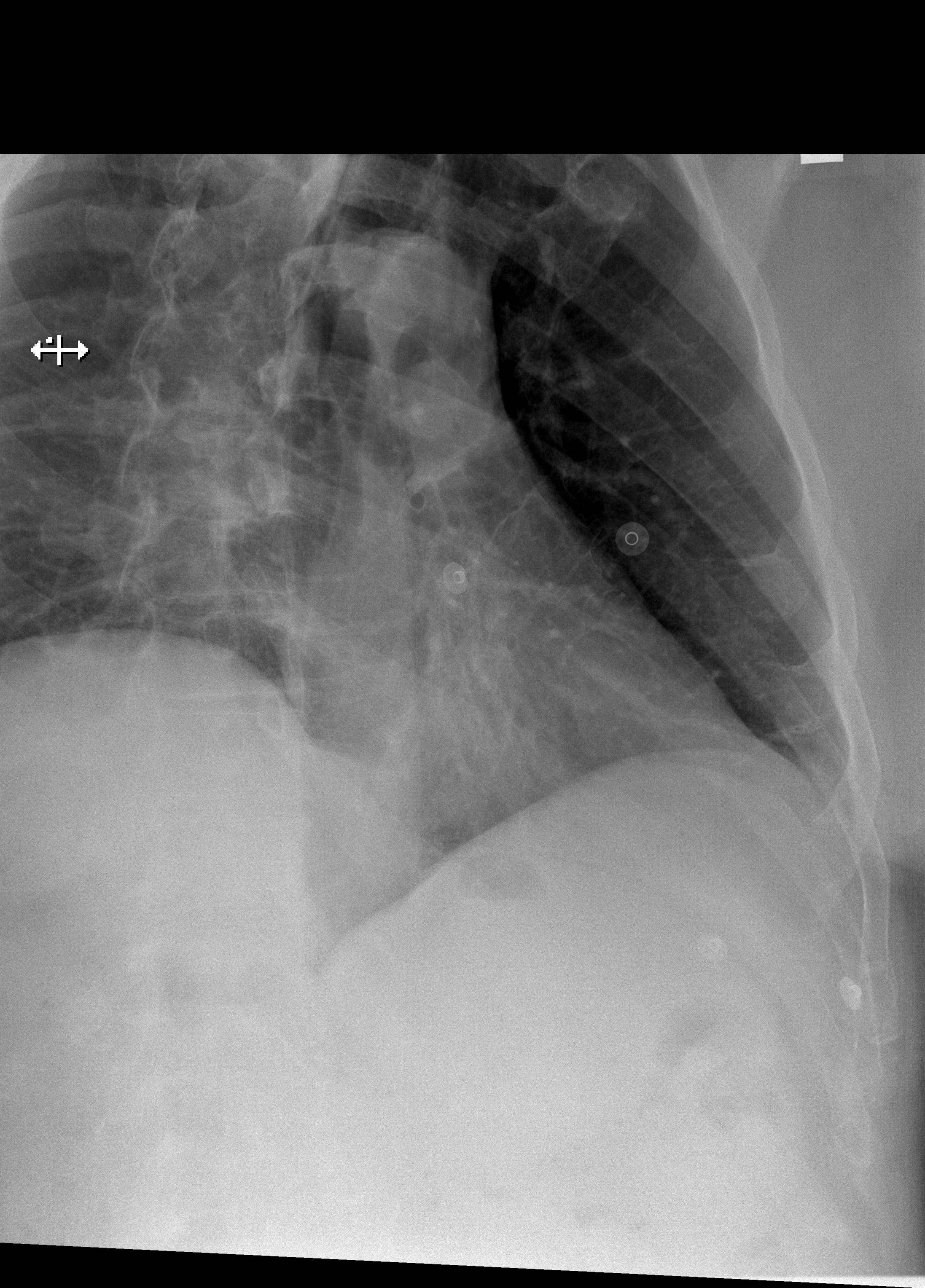

[w ribs obl left]
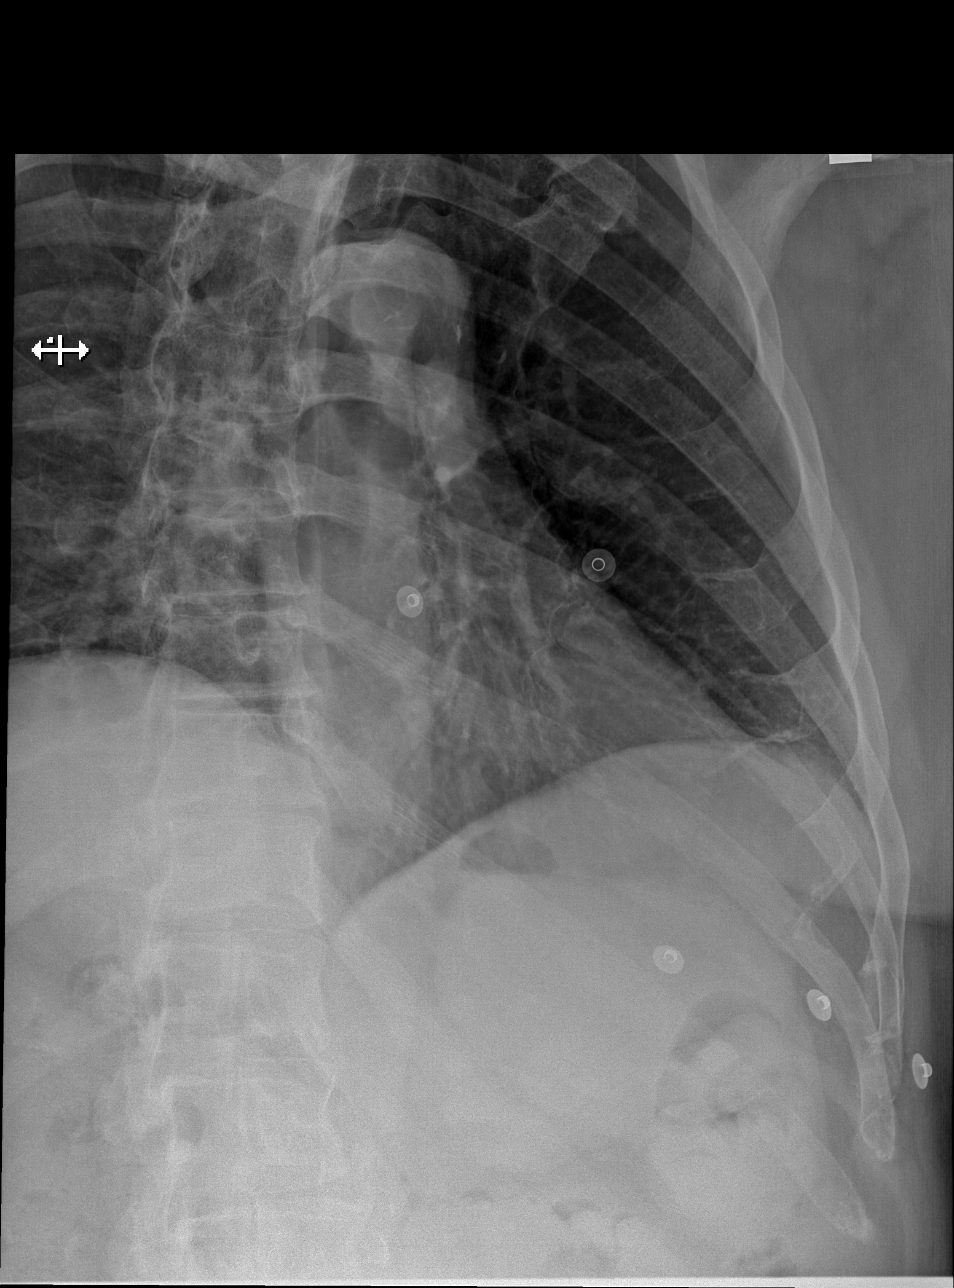

[4 of 4 positions shown; findings below may reference images not displayed]

FINDINGS: No fracture or other bone lesions are seen involving the ribs. There
is no evidence of pneumothorax or pleural effusion. Both lungs are
clear. Heart size and mediastinal contours are within normal limits.
IMPRESSION: Negative.

## 2021-03-10 MED ORDER — HYDROGEN PEROXIDE 3 % EX SOLN
CUTANEOUS | Status: AC
  Filled 2021-03-10: qty 473

## 2021-03-10 MED ORDER — LIDOCAINE-EPINEPHRINE (PF) 2 %-1:200000 IJ SOLN
10.0000 mL | Freq: Once | INTRAMUSCULAR | Status: DC
  Filled 2021-03-10: qty 20

## 2021-03-10 NOTE — ED Triage Notes (Signed)
Pt BIB EMS from home, pt had a fall around 2100 in the shower. Pt has a laceration to the right side of his head. No LOC.

## 2021-03-10 NOTE — Discharge Instructions (Signed)
Return for redness drainage or if you get a fever.  The sutures that were used are dissolvable that should dissolve between day 3 and day 5.  If they are still there then you can gently plucked them out with tweezers.  The area can get wet but not fully immersed underwater.  No scrubbing.  If you really want to clean it you can apply a half-and-half hydrogen peroxide solution with water on a Q-tip.  You can apply an ointment a couple times a day this could be as simple as Vaseline but could also be an antibiotic ointment if you wish..  Once it is healed please try to avoid prolonged sun exposure use sunscreen.    You had an abnormal finding on your CT scan.  We discussed this with your family doctor.  They likely will want to obtain an MRI to look at it closer.  Please return for worsening headache confusion or vomiting.

## 2021-03-10 NOTE — ED Notes (Signed)
Pt refused vitals, wanting to be discharged immediately

## 2021-03-10 NOTE — ED Notes (Signed)
Pt gone to CT 

## 2021-03-10 NOTE — ED Notes (Signed)
Suture cart and laceration tray at bedside.

## 2021-03-10 NOTE — ED Notes (Signed)
Dr. Floyd at bedside. 

## 2021-03-10 NOTE — ED Provider Notes (Signed)
Collin Bridges   CSN: [id number] Arrival date & time: 03/10/21  0010     History Chief Complaint  Patient presents with   Collin Bridges is a 75 y.o. male.  75 yo M with a chief complaint of a fall.  Patient was in the shower and he lost his balance trying to pull on the bar in his shower and ended up striking the left side of his chest against the wall and then struck his head on something.  He was concerned about the sheer amount of blood loss that he had had.  He called his neighbor who came over and took a look at him and told him he needed to come to the emergency department.  He denies confusion denies vomiting denies blood thinner use.  He denies any neck pain denies difficulty breathing denies abdominal pain denies extremity pain.  The history is provided by the patient.  Fall This is a new problem. The current episode started 6 to 12 hours ago. The problem occurs rarely. The problem has been resolved. Associated symptoms include chest pain. Pertinent negatives include no abdominal pain, no headaches and no shortness of breath. Nothing aggravates the symptoms. Nothing relieves the symptoms. He has tried nothing for the symptoms. The treatment provided no relief.      Past Medical History:  Diagnosis Date   Allergy    seasonal   Anxiety    Depression    Headache    Hepatitis C    HLD (hyperlipidemia) 08/09/2017   Hypertension    Hypoglycemia     Patient Active Problem List   Diagnosis Date Noted   Increased prostate specific antigen (PSA) velocity 12/14/2020   CKD (chronic kidney disease) stage 3, GFR 30-59 ml/min ([place]) 12/14/2020   HLD (hyperlipidemia) 08/09/2017   Diabetes ([place]) 08/09/2017   Encounter for well adult exam with abnormal findings 07/29/2016   Aortic stenosis, mild 07/29/2016   Insomnia 07/29/2016   Erectile dysfunction 03/30/2016   Anxiety disorder 01/06/2016   Benign prostatic  hyperplasia with urinary frequency 01/06/2016   Essential hypertension 02/02/2012   Hypoglycemia 06/15/44    Past Surgical History:  Procedure Laterality Date   APPENDECTOMY     TONSILECTOMY, ADENOIDECTOMY, BILATERAL MYRINGOTOMY AND TUBES         Family History  Problem Relation Age of Onset   Hyperlipidemia Father    Hypertension Father     Social History   Tobacco Use   Smoking status: Former   Smokeless tobacco: Never   Tobacco comments:    quit 21yrs ago, smoke a cigar occassionally.  Substance Use Topics   Alcohol use: No   Drug use: No    Home Medications Prior to Admission medications   Medication Sig Start Date End Date Taking? Authorizing Provider  amLODipine (NORVASC) 10 MG tablet Take 1 tablet (10 mg total) by mouth daily. 10/03/19   Biagio Borg, MD  fluticasone [facility]) 50 MCG/ACT nasal spray USE 2 SPRAYS IN [facility] NOSTRIL DAILY 01/06/20   Biagio Borg, MD  levofloxacin (LEVAQUIN) 500 MG tablet Take 1 tablet (500 mg total) by mouth daily. 06/30/20   Marrian Salvage, FNP  loratadine (CLARITIN) 10 MG tablet Take 1 tablet (10 mg total) by mouth daily. 05/27/19   Biagio Borg, MD  losartan (COZAAR) 50 MG tablet Take 1 tablet (50 mg total) by mouth daily. 05/29/20   Biagio Borg, MD  PARoxetine (PAXIL)  40 MG tablet Take 1 tablet (40 mg total) by mouth every morning. 05/29/20   Biagio Borg, MD  QUEtiapine (SEROQUEL) 50 MG tablet Take 1 tablet (50 mg total) by mouth 2 (two) times daily. 05/29/20   Biagio Borg, MD  zolpidem (AMBIEN) 10 MG tablet Take 1 tablet (10 mg total) by mouth at bedtime as needed. for sleep 10/06/20   Biagio Borg, MD    Allergies    Bee pollen  Review of Systems   Review of Systems  Constitutional:  Negative for chills and fever.  HENT:  Negative for congestion and facial swelling.   Eyes:  Negative for discharge and visual disturbance.  Respiratory:  Negative for shortness of breath.   Cardiovascular:  Positive for chest  pain. Negative for palpitations.  Gastrointestinal:  Negative for abdominal pain, diarrhea and vomiting.  Musculoskeletal:  Negative for arthralgias and myalgias.  Skin:  Positive for wound. Negative for color change and rash.  Neurological:  Negative for tremors, syncope and headaches.  Psychiatric/Behavioral:  Negative for confusion and dysphoric mood.    Physical Exam Updated Vital Signs BP (!) 170/90 (BP Location: Right Arm)   Pulse (!) 103   Temp 98.2 F (36.8 C) (Oral)   Resp 16   Ht 6' (1.829 m)   Wt 81.6 kg   SpO2 95%   BMI 24.41 kg/m   Physical Exam Vitals and nursing Bridges reviewed.  Constitutional:      Appearance: He is well-developed.  HENT:     Head: Normocephalic.     Comments: Right frontal laceration approximately 8 cm Eyes:     Pupils: Pupils are equal, round, and reactive to light.  Neck:     Vascular: No JVD.  Cardiovascular:     Rate and Rhythm: Normal rate and regular rhythm.     Heart sounds: No murmur heard.   No friction rub. No gallop.  Pulmonary:     Effort: No respiratory distress.     Breath sounds: No wheezing.  Abdominal:     General: There is no distension.     Tenderness: There is no abdominal tenderness. There is no guarding or rebound.  Musculoskeletal:        General: Tenderness present. Normal range of motion.     Cervical back: Normal range of motion and neck supple.     Comments: Mild tenderness about the left lateral rib margin.  No obvious signs of trauma to the chest abdomen pelvis.  Skin:    Coloration: Skin is not pale.     Findings: No rash.  Neurological:     Mental Status: He is alert and oriented to person, place, and time.  Psychiatric:        Behavior: Behavior normal.    ED Results / Procedures / Treatments   Labs (all labs ordered are listed, but only abnormal results are displayed) Labs Reviewed - No data to display  EKG None  Radiology DG Ribs Unilateral W/Chest Left  Result Date:  03/10/2021 CLINICAL DATA:  Status post fall. EXAM: LEFT RIBS AND CHEST - 3+ VIEW COMPARISON:  August 09, 2007 FINDINGS: No fracture or other bone lesions are seen involving the ribs. There is no evidence of pneumothorax or pleural effusion. Both lungs are clear. Heart size and mediastinal contours are within normal limits. IMPRESSION: Negative. Electronically Signed   By: Virgina Norfolk M.D.   On: 03/10/2021 02:59   CT Head Wo Contrast  Result Date: 03/10/2021 CLINICAL DATA:  Status post fall. EXAM: CT HEAD WITHOUT CONTRAST TECHNIQUE: Contiguous axial images were obtained from the base of the skull through the vertex without intravenous contrast. COMPARISON:  None. FINDINGS: Brain: There is mild cerebral atrophy with widening of the extra-axial spaces and ventricular dilatation. There are areas of decreased attenuation within the white matter tracts of the supratentorial brain, consistent with microvascular disease changes. There is a small chronic right basal ganglia lacunar infarct. A 12 mm x 7 mm x 9 mm partially calcified area of extra-axial increased attenuation is seen below the anterior aspect of the tentorium on the left (axial CT image 11, CT series 2). Vascular: No hyperdense vessels are identified. Skull: Normal. Negative for fracture or focal lesion. Sinuses/Orbits: No acute finding. Other: A small right frontal scalp soft tissue defect is seen. IMPRESSION: 1. Findings likely consistent with a small meningioma below the anterior aspect of the tentorium on the left. MRI correlation is recommended. 2. Small right frontal scalp soft tissue defect. 3. No acute intracranial abnormality. Electronically Signed   By: Virgina Norfolk M.D.   On: 03/10/2021 03:04    Procedures .Marland KitchenLaceration Repair  Date/Time: 03/10/2021 4:17 AM Performed by: Deno Etienne, DO Authorized by: Deno Etienne, DO   Consent:    Consent obtained:  Verbal   Consent given by:  Patient   Risks, benefits, and alternatives were  discussed: yes     Risks discussed:  Infection, pain, poor cosmetic result and poor wound healing   Alternatives discussed:  No treatment, delayed treatment and observation Universal protocol:    Procedure explained and questions answered to patient or proxy's satisfaction: yes     Patient identity confirmed:  Verbally with patient Anesthesia:    Anesthesia method:  Local infiltration   Local anesthetic:  Lidocaine 2% WITH epi Laceration details:    Location:  Face   Face location:  Forehead   Length (cm):  8.2 Pre-procedure details:    Preparation:  Patient was prepped and draped in usual sterile fashion Exploration:    Limited defect created (wound extended): no     Hemostasis achieved with:  Epinephrine and direct pressure   Imaging obtained comment:  CT   Imaging outcome: foreign body not noted     Wound exploration: entire depth of wound visualized     Wound extent: no underlying fracture noted     Contaminated: no   Treatment:    Area cleansed with:  Saline   Amount of cleaning:  Extensive   Irrigation solution:  Sterile saline   Irrigation volume:  80   Irrigation method:  Syringe   Visualized foreign bodies/material removed: no     Debridement:  None   Undermining:  None   Scar revision: no   Skin repair:    Repair method:  Sutures   Suture size:  5-0   Suture material:  Fast-absorbing gut   Suture technique:  Simple interrupted   Number of sutures:  8 Approximation:    Approximation:  Close Repair type:    Repair type:  Simple Post-procedure details:    Dressing:  Adhesive bandage and antibiotic ointment   Procedure completion:  Tolerated well, no immediate complications   Medications Ordered in ED Medications  lidocaine-EPINEPHrine (XYLOCAINE W/EPI) 2 %-1:200000 (PF) injection 10 mL (has no administration in time range)  hydrogen peroxide 3 % external solution (has no administration in time range)    ED Course  I have reviewed the triage vital signs and  the nursing notes.  Pertinent labs & imaging results that were available during my care of the patient were reviewed by me and considered in my medical decision making (see chart for details).    MDM Rules/Calculators/A&P                           75 yo M with a chief complaints of a fall.  Nonsyncopal by history.  Complaining of mild left rib pain and mild headache.  CT of the head without obvious intracranial hemorrhage the some concern for meningioma.  Recommendation for MRI.  I discussed this with the patient.  Refer to follow-up with his family doctor for further imaging.  Plan return for worsening headache confusion or vomiting.  4:18 AM:  I have discussed the diagnosis/risks/treatment options with the patient and believe the pt to be eligible for discharge home to follow-up with PCP. We also discussed returning to the ED immediately if new or worsening sx occur. We discussed the sx which are most concerning (e.g., sudden worsening pain, fever, inability to tolerate by mouth, confusion, vomiting) that necessitate immediate return. Medications administered to the patient during their visit and any new prescriptions provided to the patient are listed below.  Medications given during this visit Medications  lidocaine-EPINEPHrine (XYLOCAINE W/EPI) 2 %-1:200000 (PF) injection 10 mL (has no administration in time range)  hydrogen peroxide 3 % external solution (has no administration in time range)     The patient appears reasonably screen and/or stabilized for discharge and I doubt any other medical condition or other [facility] requiring further screening, evaluation, or treatment in the ED at this time prior to discharge.   Final Clinical Impression(s) / ED Diagnoses Final diagnoses:  Laceration of head without foreign body, unspecified part of head, initial encounter  Rib pain on left side    Rx / DC Orders ED Discharge Orders     None        Deno Etienne, DO 03/10/21 [phone]

## 2021-03-11 ENCOUNTER — Ambulatory Visit (INDEPENDENT_AMBULATORY_CARE_PROVIDER_SITE_OTHER): Payer: Medicare (Managed Care) | Admitting: Internal Medicine

## 2021-03-11 ENCOUNTER — Encounter: Payer: Self-pay | Admitting: Internal Medicine

## 2021-03-11 VITALS — BP 138/80 | HR 103 | Ht 72.0 in | Wt 191.0 lb

## 2021-03-11 DIAGNOSIS — I6381 Other cerebral infarction due to occlusion or stenosis of small artery: Secondary | ICD-10-CM

## 2021-03-11 DIAGNOSIS — S0101XD Laceration without foreign body of scalp, subsequent encounter: Secondary | ICD-10-CM

## 2021-03-11 DIAGNOSIS — R519 Headache, unspecified: Secondary | ICD-10-CM

## 2021-03-11 DIAGNOSIS — G319 Degenerative disease of nervous system, unspecified: Secondary | ICD-10-CM | POA: Diagnosis not present

## 2021-03-11 DIAGNOSIS — G9389 Other specified disorders of brain: Secondary | ICD-10-CM | POA: Diagnosis not present

## 2021-03-11 DIAGNOSIS — E1165 Type 2 diabetes mellitus with hyperglycemia: Secondary | ICD-10-CM

## 2021-03-11 DIAGNOSIS — I10 Essential (primary) hypertension: Secondary | ICD-10-CM

## 2021-03-11 DIAGNOSIS — N1831 Chronic kidney disease, stage 3a: Secondary | ICD-10-CM

## 2021-03-11 MED ORDER — TRAMADOL HCL 50 MG PO TABS: 50.0000 mg | ORAL_TABLET | Freq: Four times a day (QID) | ORAL | 0 refills | Status: DC | PRN

## 2021-03-11 MED ORDER — ASPIRIN 325 MG PO TBEC: 325.0000 mg | DELAYED_RELEASE_TABLET | Freq: Every day | ORAL | 99 refills | Status: DC

## 2021-03-11 NOTE — Progress Notes (Addendum)
Patient ID: Collin Bridges, male   DOB: 04-Apr-1946, 75 y.o.   MRN: [id number]        Chief Complaint: follow up fall in shower with right scalp laceration and HA, incidental right basal ganglion cva/cerebral atrophy and left brain mass noted on CT       HPI:  Collin Bridges is a 75 y.o. male here with c/o above on oct 26, overall doing ok, but HA is still 7/10 after fall in shower, hit head, CT Head without scalp laceration and several more chronic findings, pt here to f/u for MRI for further evaluation.   Pt denies polydipsia, polyuria, or new focal neuro s/s.   Pt denies chest pain, increased sob or doe, wheezing, orthopnea, PND, increased LE swelling, palpitations, dizziness or syncope.   Pt denies fever, wt loss, night sweats, loss of appetite, or other constitutional symptoms  No other new complaints Wt Readings from Last 3 Encounters:  03/11/21 191 lb (86.6 kg)  03/10/21 180 lb (81.6 kg)  12/14/20 194 lb 9.6 oz (88.3 kg)   BP Readings from Last 3 Encounters:  03/11/21 138/80  03/10/21 (!) 170/90  12/14/20 (!) 160/82         Past Medical History:  Diagnosis Date   Allergy    seasonal   Anxiety    Depression    Headache    Hepatitis C    HLD (hyperlipidemia) 08/09/2017   Hypertension    Hypoglycemia    Past Surgical History:  Procedure Laterality Date   APPENDECTOMY     TONSILECTOMY, ADENOIDECTOMY, BILATERAL MYRINGOTOMY AND TUBES      reports that he has quit smoking. He has never used smokeless tobacco. He reports that he does not drink alcohol and does not use drugs. family history includes Hyperlipidemia in his father; Hypertension in his father. Allergies  Allergen Reactions   Bee Pollen     Nasal congestion    Current Outpatient Medications on File Prior to Visit  Medication Sig Dispense Refill   amLODipine (NORVASC) 10 MG tablet Take 1 tablet (10 mg total) by mouth daily. 90 tablet 3   fluticasone (FLONASE) 50 MCG/ACT nasal spray USE 2 SPRAYS IN  EACH NOSTRIL DAILY 48 g 3   loratadine (CLARITIN) 10 MG tablet Take 1 tablet (10 mg total) by mouth daily. 90 tablet 3   losartan (COZAAR) 50 MG tablet Take 1 tablet (50 mg total) by mouth daily. 90 tablet 3   PARoxetine (PAXIL) 40 MG tablet Take 1 tablet (40 mg total) by mouth every morning. 90 tablet 3   QUEtiapine (SEROQUEL) 50 MG tablet Take 1 tablet (50 mg total) by mouth 2 (two) times daily. 180 tablet 3   No current facility-administered medications on file prior to visit.        ROS:  All others reviewed and negative.  Objective        PE:  BP 138/80 (BP Location: Left Arm, Patient Position: Sitting, Cuff Size: Large)   Pulse (!) 103   Ht 6' (1.829 m)   Wt 191 lb (86.6 kg)   SpO2 96%   BMI 25.90 kg/m                 Constitutional: Pt appears in NAD               HENT: Head: NCAT.                Right Ear: External ear normal.  Left Ear: External ear normal.                Eyes: . Pupils are equal, round, and reactive to light. Conjunctivae and EOM are normal               Nose: without d/c or deformity               Neck: Neck supple. Gross normal ROM               Cardiovascular: Normal rate and regular rhythm.                 Pulmonary/Chest: Effort normal and breath sounds without rales or wheezing.                Abd:  Soft, NT, ND, + BS, no organomegaly               Neurological: Pt is alert. At baseline orientation, motor grossly intact               Skin:, LE edema - none, right scalp with laceration intact               Psychiatric: Pt behavior is normal without agitation   Micro: none  Cardiac tracings I have personally interpreted today:  none  Pertinent Radiological findings (summarize): none   Lab Results  Component Value Date   WBC 8.6 05/22/2020   HGB 15.3 05/22/2020   HCT 43.6 05/22/2020   PLT 132.0 (L) 05/22/2020   GLUCOSE 188 (H) 12/14/2020   CHOL 238 (H) 12/14/2020   TRIG (H) 12/14/2020    448.0 Triglyceride is over 400;  calculations on Lipids are invalid.   HDL 38.20 (L) 12/14/2020   LDLDIRECT 113.0 12/14/2020   LDLCALC 178 (H) 07/29/2016   ALT 14 12/14/2020   AST 19 12/14/2020   NA 137 12/14/2020   K 4.3 12/14/2020   CL 103 12/14/2020   CREATININE 1.35 12/14/2020   BUN 13 12/14/2020   CO2 27 12/14/2020   TSH 0.99 05/22/2020   PSA 2.73 12/14/2020   HGBA1C 6.6 (H) 12/14/2020   MICROALBUR 2.8 (H) 05/22/2020   Assessment/Plan:  Collin Bridges is a 75 y.o. White or Caucasian [1] male with  has a past medical history of Allergy, Anxiety, Depression, Headache, Hepatitis C, HLD (hyperlipidemia) (08/09/2017), Hypertension, and Hypoglycemia.  Essential hypertension Improved post ED visit, cont same tx  BP Readings from Last 3 Encounters:  03/11/21 138/80  03/10/21 (!) 170/90  12/14/20 (!) 160/82   Stable, pt to continue medical treatment norvasc, losartan   Diabetes ([place]) Lab Results  Component Value Date   HGBA1C 6.6 (H) 12/14/2020   Stable, pt to continue current medical treatment  - diet   CKD (chronic kidney disease) stage 3, GFR 30-59 ml/min ([facility]) Lab Results  Component Value Date   CREATININE 1.35 12/14/2020   Stable overall, cont to avoid nephrotoxins  Brain mass Incidental finding, likely meningioma, for MRI for further evaluation  Headache Post fall, persistent moderate, for tramadol prn limited rx, should not need long term rx  Cerebral atrophy ([place]) New incidental finding, d/w pt  - no specific tx to improved, but should continue to work on close control of all metabolic parameters including htn, hld, hyperglycemia, as well as any migraine  Stroke of right basal ganglia ([facility]) Incidental finding, apparently asympt and age indeterminate, for asa increased to 325 qd, f/u MRI as above, declines other such as carotids  or echo for now  Scalp laceration, subsequent encounter Will need f/u visit for stitches out at 7-10 days  Followup: Return in about 3 months (around  06/11/2021).  Collin Cower, MD 03/17/2021 1:12 PM [facility]

## 2021-03-11 NOTE — Patient Instructions (Addendum)
Please take all new medication as prescribed  - the tramadol for pain  Please continue the Aspirin 325 mg per day (ecotrin) to help prevent more lacunar (small) strokes  Please continue all other medications as before  Please have the pharmacy call with any other refills you may need.  Please continue your efforts at being more active, low cholesterol diet, and weight control.  Please keep your appointments with your specialists as you may have planned  You will be contacted regarding the referral for: MRI for the brain  Please return in 7-8 days for stitches to be removed  Please make an Appointment to return in 3 months, or sooner if needed, including 1 week for stitches out

## 2021-03-14 ENCOUNTER — Other Ambulatory Visit: Payer: Self-pay | Admitting: Internal Medicine

## 2021-03-15 ENCOUNTER — Encounter: Payer: Self-pay | Admitting: Family

## 2021-03-15 DIAGNOSIS — G9389 Other specified disorders of brain: Secondary | ICD-10-CM

## 2021-03-15 NOTE — Telephone Encounter (Signed)
Collin Bridges to see pt concern, please

## 2021-03-16 DIAGNOSIS — I639 Cerebral infarction, unspecified: Secondary | ICD-10-CM

## 2021-03-16 HISTORY — DX: Cerebral infarction, unspecified: I63.9

## 2021-03-17 ENCOUNTER — Encounter: Payer: Self-pay | Admitting: Internal Medicine

## 2021-03-17 DIAGNOSIS — G9389 Other specified disorders of brain: Secondary | ICD-10-CM | POA: Insufficient documentation

## 2021-03-17 DIAGNOSIS — I6381 Other cerebral infarction due to occlusion or stenosis of small artery: Secondary | ICD-10-CM | POA: Insufficient documentation

## 2021-03-17 DIAGNOSIS — S0101XD Laceration without foreign body of scalp, subsequent encounter: Secondary | ICD-10-CM | POA: Insufficient documentation

## 2021-03-17 DIAGNOSIS — R519 Headache, unspecified: Secondary | ICD-10-CM | POA: Insufficient documentation

## 2021-03-17 DIAGNOSIS — G319 Degenerative disease of nervous system, unspecified: Secondary | ICD-10-CM | POA: Insufficient documentation

## 2021-03-17 NOTE — Assessment & Plan Note (Signed)
Lab Results  Component Value Date   HGBA1C 6.6 (H) 12/14/2020   Stable, pt to continue current medical treatment  - diet

## 2021-03-17 NOTE — Assessment & Plan Note (Signed)
New incidental finding, d/w pt  - no specific tx to improved, but should continue to work on close control of all metabolic parameters including htn, hld, hyperglycemia, as well as any migraine

## 2021-03-17 NOTE — Assessment & Plan Note (Signed)
Will need f/u visit for stitches out at 7-10 days

## 2021-03-17 NOTE — Assessment & Plan Note (Addendum)
Incidental finding, likely meningioma, for MRI for further evaluation

## 2021-03-17 NOTE — Assessment & Plan Note (Signed)
Post fall, persistent moderate, for tramadol prn limited rx, should not need long term rx

## 2021-03-17 NOTE — Assessment & Plan Note (Signed)
Improved post ED visit, cont same tx  BP Readings from Last 3 Encounters:  03/11/21 138/80  03/10/21 (!) 170/90  12/14/20 (!) 160/82   Stable, pt to continue medical treatment norvasc, losartan

## 2021-03-17 NOTE — Assessment & Plan Note (Signed)
Lab Results  Component Value Date   CREATININE 1.35 12/14/2020   Stable overall, cont to avoid nephrotoxins

## 2021-03-17 NOTE — Assessment & Plan Note (Signed)
Incidental finding, apparently asympt and age indeterminate, for asa increased to 325 qd, f/u MRI as above, declines other such as carotids or echo for now

## 2021-03-18 ENCOUNTER — Encounter: Payer: Self-pay | Admitting: Internal Medicine

## 2021-03-18 ENCOUNTER — Telehealth: Payer: Self-pay | Admitting: Internal Medicine

## 2021-03-18 NOTE — Telephone Encounter (Signed)
Collin Borg, MD  [name], CMA; [name], [name] Please to contact pt for ROV for stitches removal anytime from nov 3 - nov 7 if possible thanks   Contacted patient, lvm to call back to schedule stitches removal Possibly Monday 11.7.22 with Sharlet Salina

## 2021-03-18 NOTE — Telephone Encounter (Signed)
Patient calling to check status of mri  Please advise

## 2021-03-18 NOTE — Telephone Encounter (Signed)
Please assist with getting patient's MRI authorized. Number to call is [phone].

## 2021-03-19 NOTE — Addendum Note (Signed)
Addended by: Biagio Borg on: 03/19/2021 08:46 AM   Modules accepted: Orders

## 2021-03-22 ENCOUNTER — Other Ambulatory Visit: Payer: Self-pay

## 2021-03-22 ENCOUNTER — Ambulatory Visit (INDEPENDENT_AMBULATORY_CARE_PROVIDER_SITE_OTHER): Payer: Medicare (Managed Care) | Admitting: Internal Medicine

## 2021-03-22 ENCOUNTER — Encounter: Payer: Self-pay | Admitting: Internal Medicine

## 2021-03-22 VITALS — BP 130/80 | HR 97 | Resp 18 | Ht 72.0 in | Wt 191.0 lb

## 2021-03-22 DIAGNOSIS — R519 Headache, unspecified: Secondary | ICD-10-CM

## 2021-03-22 DIAGNOSIS — Z23 Encounter for immunization: Secondary | ICD-10-CM | POA: Diagnosis not present

## 2021-03-22 NOTE — Progress Notes (Signed)
   Subjective:   Patient ID: Collin Bridges, male    DOB: 1946-03-09, 75 y.o.   MRN: [id number]  Suture / Staple Removal  The patient is a 75 YO man coming in for suture removal.  Review of Systems  Constitutional: Negative.   HENT: Negative.    Eyes: Negative.   Respiratory:  Negative for cough, chest tightness and shortness of breath.   Cardiovascular:  Negative for chest pain, palpitations and leg swelling.  Gastrointestinal:  Negative for abdominal distention, abdominal pain, constipation, diarrhea, nausea and vomiting.  Musculoskeletal: Negative.   Skin: Negative.   Neurological: Negative.   Psychiatric/Behavioral: Negative.     Objective:  Physical Exam Constitutional:      Appearance: He is well-developed.  HENT:     Head: Normocephalic and atraumatic.     Comments: Sutures removed from right forehead #8 matches amount placed reviewed ER note Cardiovascular:     Rate and Rhythm: Normal rate and regular rhythm.  Pulmonary:     Effort: Pulmonary effort is normal. No respiratory distress.     Breath sounds: Normal breath sounds. No wheezing or rales.  Abdominal:     General: Bowel sounds are normal. There is no distension.     Palpations: Abdomen is soft.     Tenderness: There is no abdominal tenderness. There is no rebound.  Musculoskeletal:     Cervical back: Normal range of motion.  Skin:    General: Skin is warm and dry.  Neurological:     Mental Status: He is alert and oriented to person, place, and time.     Coordination: Coordination normal.    Vitals:   03/22/21 0821  BP: 130/80  Pulse: 97  Resp: 18  SpO2: 97%  Weight: 191 lb (86.6 kg)  Height: 6' (1.829 m)    This visit occurred during the SARS-CoV-2 public health emergency.  Safety protocols were in place, including screening questions prior to the visit, additional usage of staff PPE, and extensive cleaning of exam room while observing appropriate contact time as indicated for disinfecting  solutions.   Assessment & Plan:  Flu shot given at visit.   Visit time 15 minutes in face to face communication with patient and coordination of care, additional 10 minutes spent in record review, coordination or care, ordering tests, communicating/referring to other healthcare professionals, documenting in medical records all on the same day of the visit for total time 25 minutes spent on the visit.

## 2021-03-22 NOTE — Patient Instructions (Signed)
You do not need to cover this area it is healed.

## 2021-03-22 NOTE — Assessment & Plan Note (Signed)
Sutures removed and #8 removed which matches number placed reviewed ER note. Advised of topical care to the area.

## 2021-04-03 ENCOUNTER — Ambulatory Visit (HOSPITAL_COMMUNITY)
Admission: RE | Admit: 2021-04-03 | Discharge: 2021-04-03 | Disposition: A | Discharge status: Home / Self Care | Payer: Medicare (Managed Care) | Source: Ambulatory Visit | Attending: Internal Medicine | Admitting: Internal Medicine

## 2021-04-03 ENCOUNTER — Other Ambulatory Visit: Payer: Self-pay

## 2021-04-03 DIAGNOSIS — G9389 Other specified disorders of brain: Secondary | ICD-10-CM | POA: Insufficient documentation

## 2021-04-03 IMAGING — MR MR HEAD W/O CM
6 of 10 series · 29 of 48 positions shown · non-contrast
Comparison: CT head [DATE].

CLINICAL DATA: Brain mass or lesion. Suspected meningioma along the
tentorium on CT.

EXAM:
MRI HEAD WITHOUT CONTRAST
TECHNIQUE: Multiplanar, multiecho pulse sequences of the brain and surrounding
structures were obtained without intravenous contrast.

[Series 2: DWI · axial · 3.0mm · 0.94mm/px · z∈[-70,+83]mm · 9 of 104 slices shown (1 of 2)]
[im 1/104]
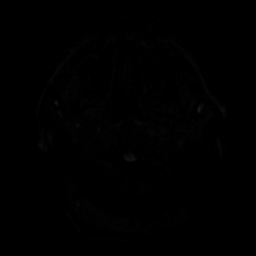
[im 13/104]
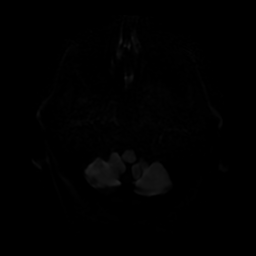
[im 26/104]
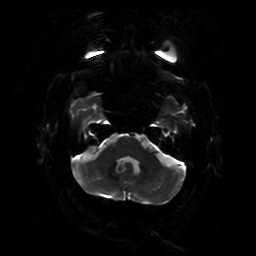
[im 39/104]
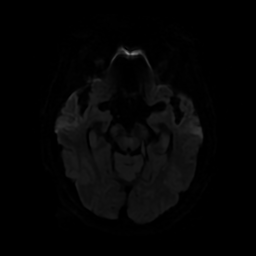
[im 52/104]
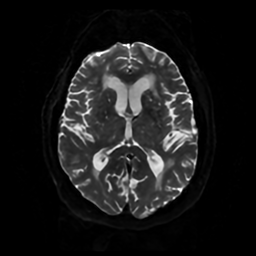
[im 65/104]
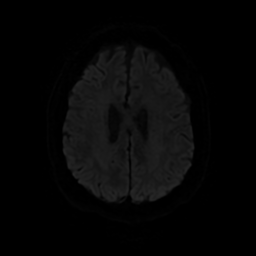
[im 78/104]
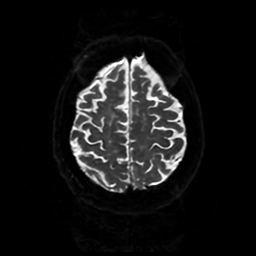
[im 91/104]
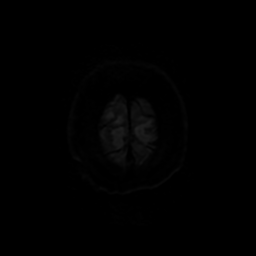
[im 104/104]
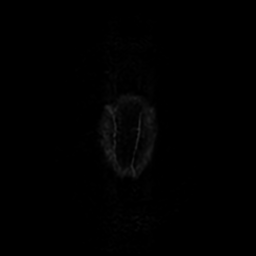

[Series 3: DWI · coronal · 4.0mm · 0.94mm/px · 7 of 76 slices shown (2 of 2)]
[im 1/76]
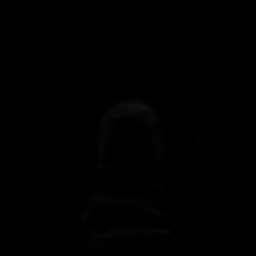
[im 13/76]
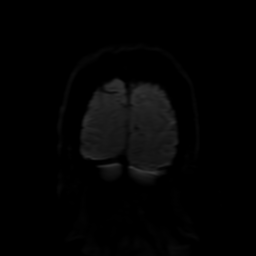
[im 26/76]
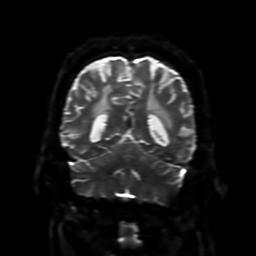
[im 38/76]
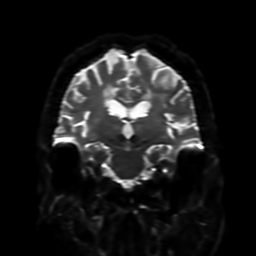
[im 51/76]
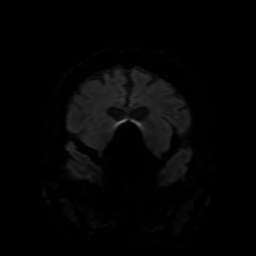
[im 63/76]
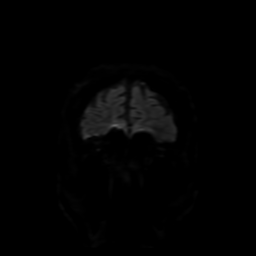
[im 76/76]
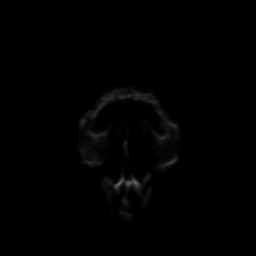

[Series 4: FLAIR · sagittal · 5.0mm · 0.23mm/px · 2 of 25 slices shown (1 of 2)]
[im 1/25]
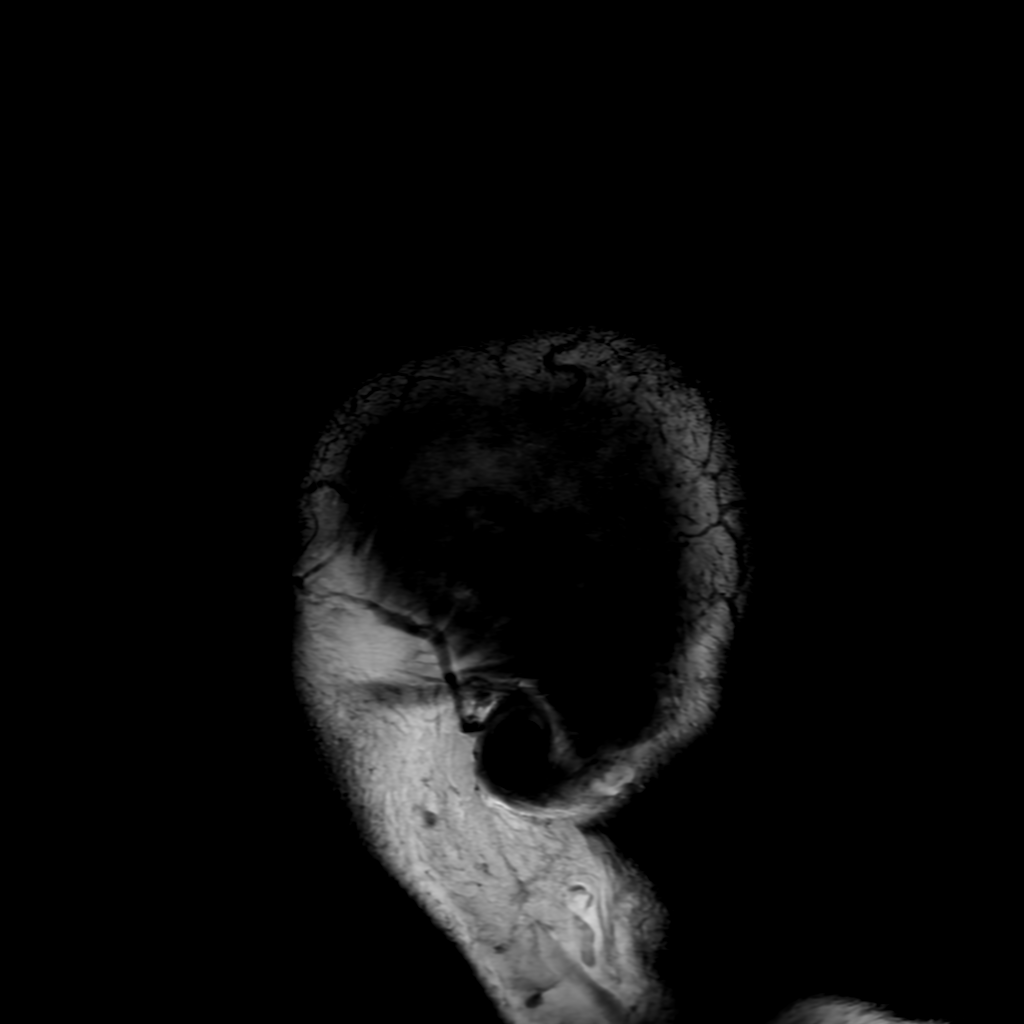
[im 25/25]
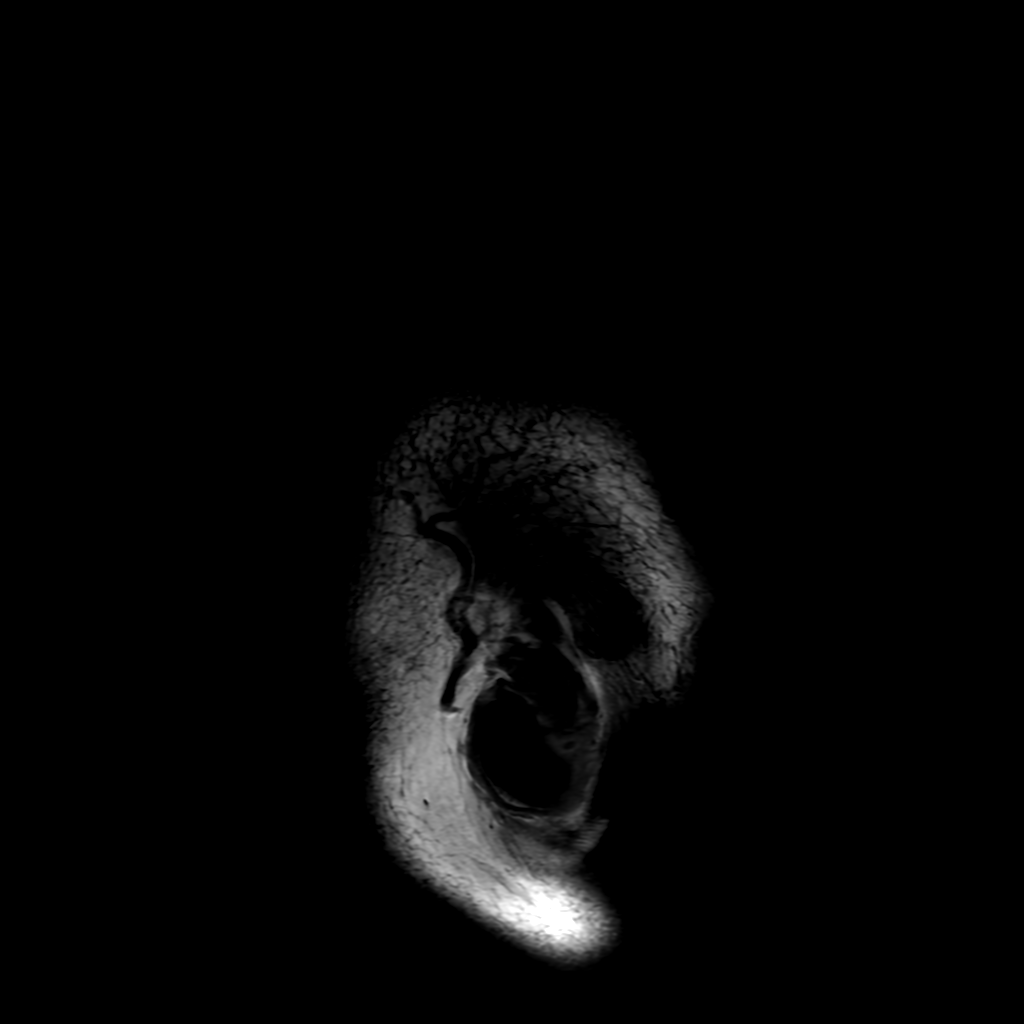

[Series 6: FLAIR · axial · 4.0mm · 0.45mm/px · z∈[-69,+81]mm · 3 of 35 slices shown (2 of 2)]
[im 1/35]
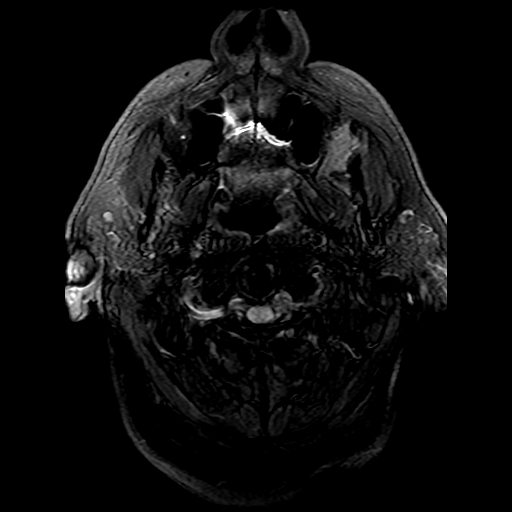
[im 18/35]
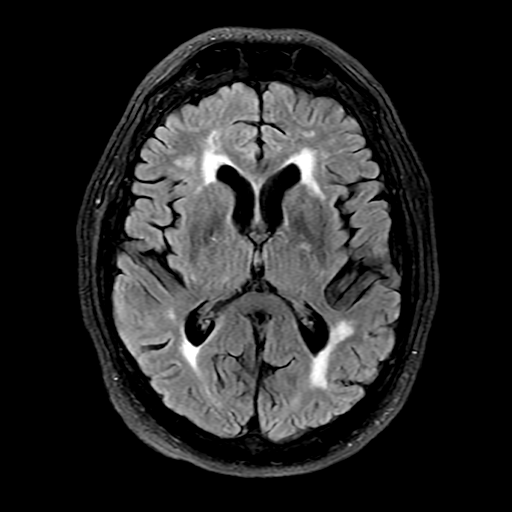
[im 35/35]
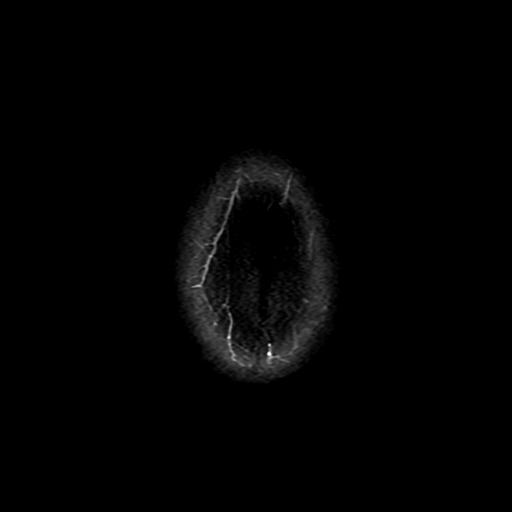

[Series 250: ADC · axial · 3.0mm · 0.94mm/px · z∈[-70,+83]mm · 5 of 52 slices shown (1 of 2)]
[im 1/52]
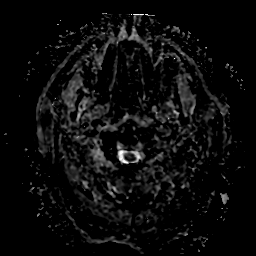
[im 13/52]
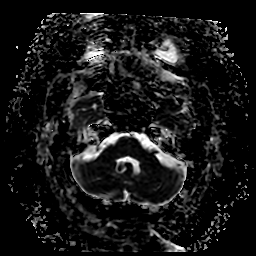
[im 26/52]
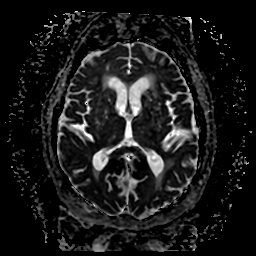
[im 39/52]
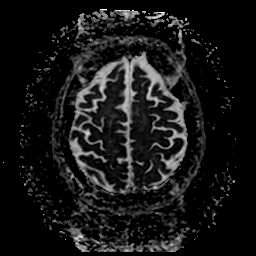
[im 52/52]
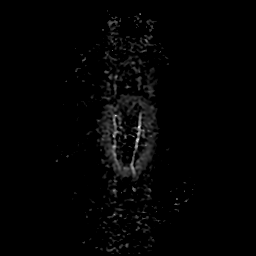

[Series 350: ADC · coronal · 4.0mm · 0.94mm/px · 3 of 37 slices shown (2 of 2)]
[im 1/37]
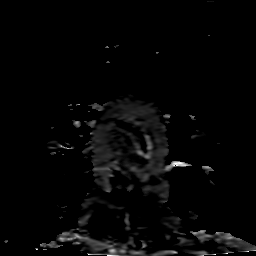
[im 19/37]
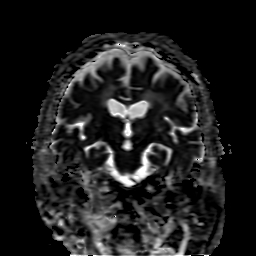
[im 37/37]
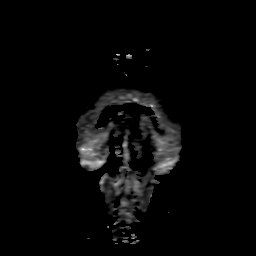

[29 of 48 positions shown; findings below may reference images not displayed]

FINDINGS: Brain: Subcentimeter foci of trace diffusion weighted signal
hyperintensity are noted in the subcortical white matter of the
posterior left temporal lobe, left parietal lobe, and right frontal
lobe on axial imaging with normal ADC suggestive of potential
subacute embolic infarcts. These are not confirmed on coronal
diffusion imaging although this is likely due to their small size
and slice selection.

No acute large territory infarct, intracranial hemorrhage, midline
shift, or extra-axial fluid collection is identified. Confluent T2
hyperintensities in the cerebral white matter bilaterally are
nonspecific but compatible with severe chronic small vessel ischemic
disease. Chronic lacunar infarcts are noted in the cerebral white
matter and basal ganglia bilaterally. There is mild cerebral
atrophy. An extra-axial mass along the undersurface of the left
tentorium measures 12 x 6 mm, partially calcified on CT and
contacting the midbrain without significant mass effect or brain
edema.

Vascular: Major intracranial vascular flow voids are preserved.

Skull and upper cervical spine: Unremarkable bone marrow signal.

Sinuses/Orbits: Unremarkable orbits. Mild mucosal thickening in the
paranasal sinuses. Clear mastoid air cells.

Other: None.
IMPRESSION: 1. Suspected subcentimeter subacute bilateral cerebral infarcts.
2. 12 mm mass along the left tentorium compatible with a meningioma.
No mass effect or edema.
3. Severe chronic small vessel ischemic disease.

## 2021-04-06 ENCOUNTER — Encounter: Payer: Self-pay | Admitting: Internal Medicine

## 2021-04-07 ENCOUNTER — Encounter (HOSPITAL_COMMUNITY): Payer: Self-pay

## 2021-04-07 ENCOUNTER — Inpatient Hospital Stay (HOSPITAL_COMMUNITY): Payer: Medicare (Managed Care)

## 2021-04-07 ENCOUNTER — Observation Stay (HOSPITAL_COMMUNITY)
Admission: EM | Admit: 2021-04-07 | Discharge: 2021-04-08 | Disposition: A | Discharge status: Home / Self Care | Payer: Medicare (Managed Care) | Attending: Internal Medicine | Admitting: Internal Medicine

## 2021-04-07 ENCOUNTER — Other Ambulatory Visit (HOSPITAL_COMMUNITY): Payer: Medicare (Managed Care)

## 2021-04-07 DIAGNOSIS — R9402 Abnormal brain scan: Secondary | ICD-10-CM | POA: Diagnosis present

## 2021-04-07 DIAGNOSIS — I129 Hypertensive chronic kidney disease with stage 1 through stage 4 chronic kidney disease, or unspecified chronic kidney disease: Secondary | ICD-10-CM | POA: Diagnosis not present

## 2021-04-07 DIAGNOSIS — D320 Benign neoplasm of cerebral meninges: Secondary | ICD-10-CM | POA: Insufficient documentation

## 2021-04-07 DIAGNOSIS — Z20822 Contact with and (suspected) exposure to covid-19: Secondary | ICD-10-CM | POA: Insufficient documentation

## 2021-04-07 DIAGNOSIS — Z87891 Personal history of nicotine dependence: Secondary | ICD-10-CM | POA: Diagnosis not present

## 2021-04-07 DIAGNOSIS — I10 Essential (primary) hypertension: Secondary | ICD-10-CM | POA: Diagnosis not present

## 2021-04-07 DIAGNOSIS — R2681 Unsteadiness on feet: Secondary | ICD-10-CM | POA: Insufficient documentation

## 2021-04-07 DIAGNOSIS — Z79899 Other long term (current) drug therapy: Secondary | ICD-10-CM | POA: Insufficient documentation

## 2021-04-07 DIAGNOSIS — Z7982 Long term (current) use of aspirin: Secondary | ICD-10-CM | POA: Diagnosis not present

## 2021-04-07 DIAGNOSIS — I6310 Cerebral infarction due to embolism of unspecified precerebral artery: Secondary | ICD-10-CM

## 2021-04-07 DIAGNOSIS — I639 Cerebral infarction, unspecified: Secondary | ICD-10-CM | POA: Diagnosis not present

## 2021-04-07 DIAGNOSIS — N1831 Chronic kidney disease, stage 3a: Secondary | ICD-10-CM | POA: Insufficient documentation

## 2021-04-07 LAB — RESP PANEL BY RT-PCR (FLU A&B, COVID) ARPGX2
Influenza A by PCR: NEGATIVE
Influenza B by PCR: NEGATIVE
SARS Coronavirus 2 by RT PCR: NEGATIVE

## 2021-04-07 LAB — CBC
HCT: 40.3 % (ref 39.0–52.0)
Hemoglobin: 13.9 g/dL (ref 13.0–17.0)
MCH: 32.9 pg (ref 26.0–34.0)
MCHC: 34.5 g/dL (ref 30.0–36.0)
MCV: 95.5 fL (ref 80.0–100.0)
Platelets: 135 10*3/uL — ABNORMAL LOW (ref 150–400)
RBC: 4.22 MIL/uL (ref 4.22–5.81)
RDW: 12.6 % (ref 11.5–15.5)
WBC: 6.6 10*3/uL (ref 4.0–10.5)
nRBC: 0 % (ref 0.0–0.2)

## 2021-04-07 LAB — DIFFERENTIAL
Abs Immature Granulocytes: 0.02 10*3/uL (ref 0.00–0.07)
Basophils Absolute: 0 10*3/uL (ref 0.0–0.1)
Basophils Relative: 1 %
Eosinophils Absolute: 0.3 10*3/uL (ref 0.0–0.5)
Eosinophils Relative: 5 %
Immature Granulocytes: 0 %
Lymphocytes Relative: 19 %
Lymphs Abs: 1.2 10*3/uL (ref 0.7–4.0)
Monocytes Absolute: 0.4 10*3/uL (ref 0.1–1.0)
Monocytes Relative: 6 %
Neutro Abs: 4.6 10*3/uL (ref 1.7–7.7)
Neutrophils Relative %: 69 %

## 2021-04-07 LAB — COMPREHENSIVE METABOLIC PANEL
ALT: 22 U/L (ref 0–44)
AST: 35 U/L (ref 15–41)
Albumin: 4.3 g/dL (ref 3.5–5.0)
Alkaline Phosphatase: 119 U/L (ref 38–126)
Anion gap: 7 (ref 5–15)
BUN: 13 mg/dL (ref 8–23)
CO2: 28 mmol/L (ref 22–32)
Calcium: 9.1 mg/dL (ref 8.9–10.3)
Chloride: 103 mmol/L (ref 98–111)
Creatinine, Ser: 1.37 mg/dL — ABNORMAL HIGH (ref 0.61–1.24)
GFR, Estimated: 53 mL/min — ABNORMAL LOW (ref >60)
Glucose, Bld: 227 mg/dL — ABNORMAL HIGH (ref 70–99)
Potassium: 4.5 mmol/L (ref 3.5–5.1)
Sodium: 138 mmol/L (ref 135–145)
Total Bilirubin: 0.7 mg/dL (ref 0.3–1.2)
Total Protein: 7.5 g/dL (ref 6.5–8.1)

## 2021-04-07 LAB — GLUCOSE, CAPILLARY: Glucose-Capillary: 167 mg/dL — ABNORMAL HIGH (ref 70–99)

## 2021-04-07 LAB — RAPID URINE DRUG SCREEN, HOSP PERFORMED
Amphetamines: NOT DETECTED
Barbiturates: NOT DETECTED
Benzodiazepines: NOT DETECTED
Cocaine: NOT DETECTED
Opiates: NOT DETECTED
Tetrahydrocannabinol: POSITIVE — AB

## 2021-04-07 LAB — PROTIME-INR
INR: 1 (ref 0.8–1.2)
Prothrombin Time: 13 seconds (ref 11.4–15.2)

## 2021-04-07 LAB — APTT: aPTT: 24 seconds (ref 24–36)

## 2021-04-07 IMAGING — MR MR MRA HEAD W/O CM
3 series · 18 of 48 positions shown · non-contrast
Comparison: None.

CLINICAL DATA: Stroke follow-up

EXAM:
MRA NECK WITHOUT CONTRAST
MRA HEAD WITHOUT CONTRAST
TECHNIQUE: Angiographic images of the Circle of Willis were acquired using MRA
technique without intravenous contrast.

[Series 3: (id) mt fs · axial · 1.4mm · 0.43mm/px · z∈[-60,+46]mm · 15 of 160 slices shown]
[im 1/160]
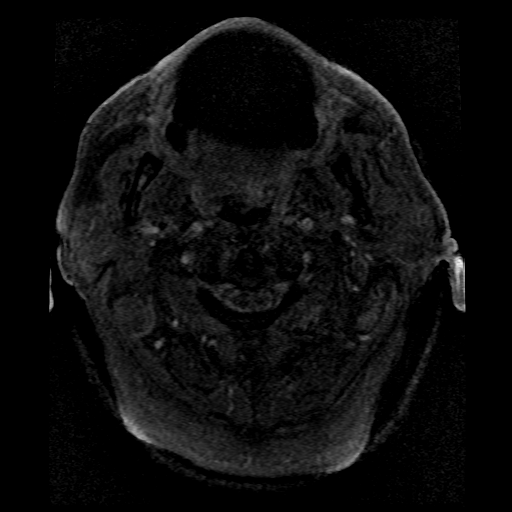
[im 4/160]
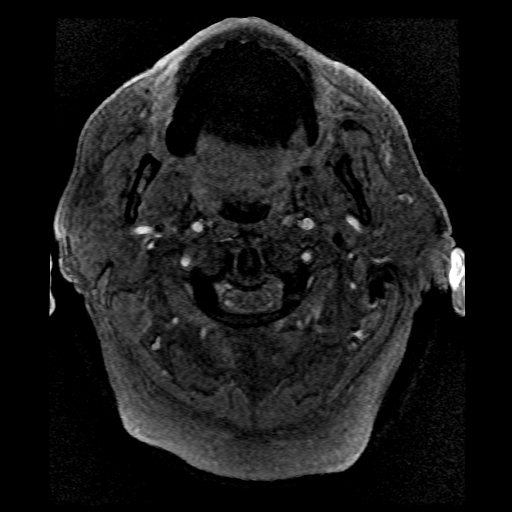
[im 8/160]
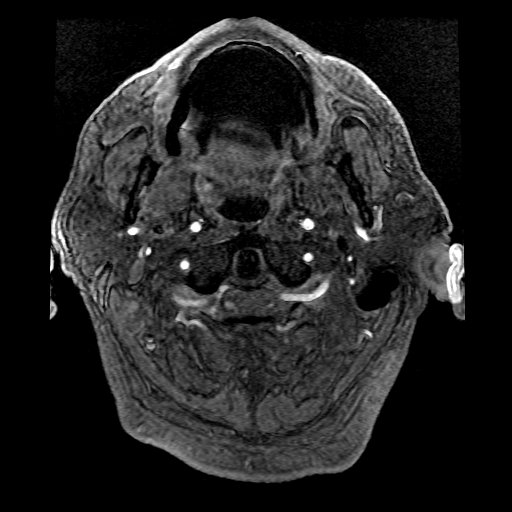
[im 11/160]
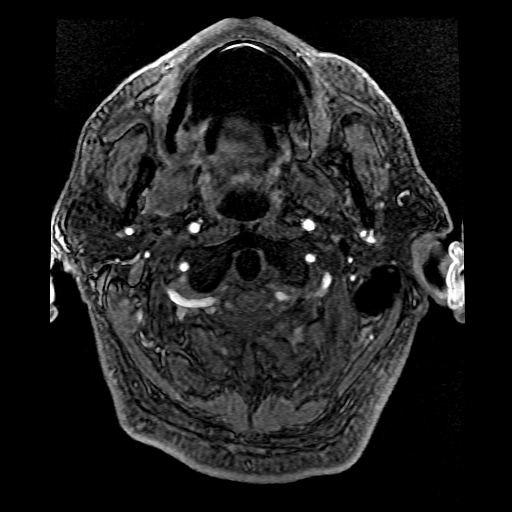
[im 15/160]
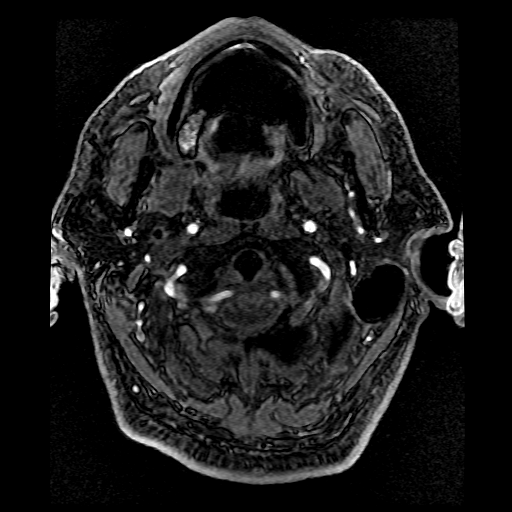
[im 26/160]
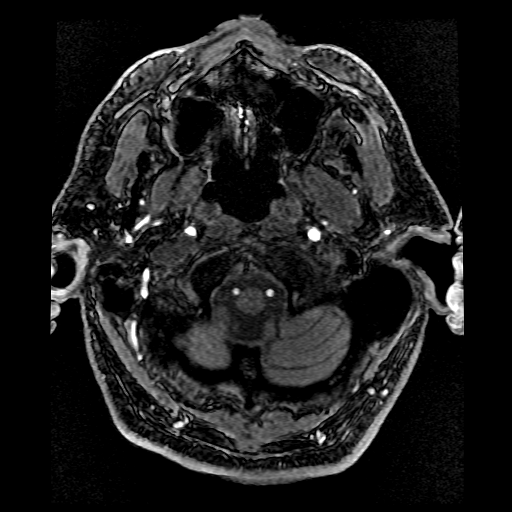
[im 29/160]
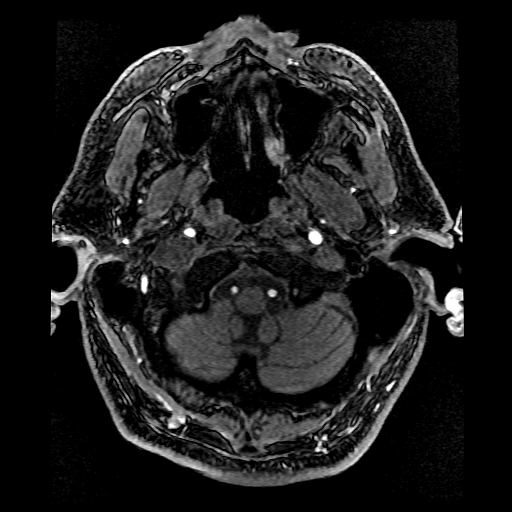
[im 51/160]
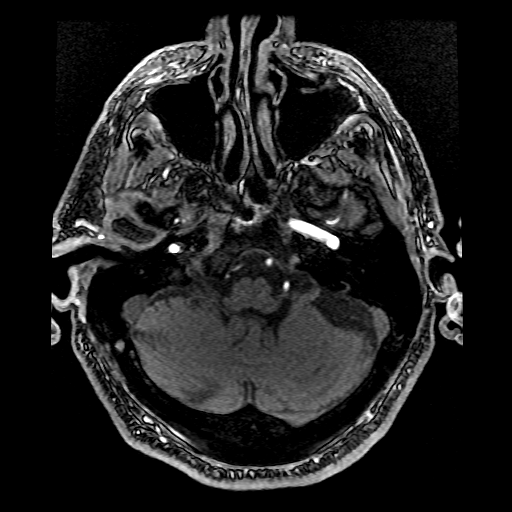
[im 69/160]
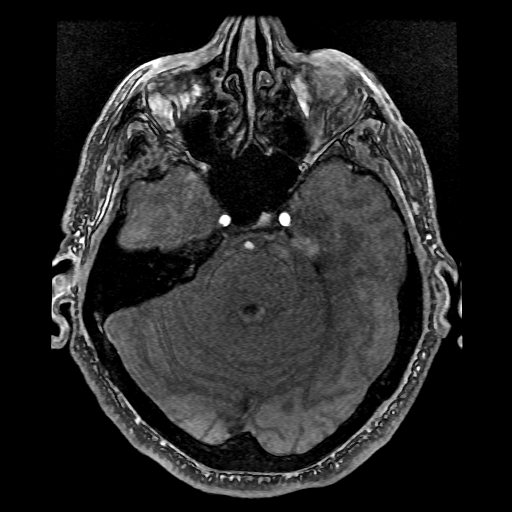
[im 80/160]
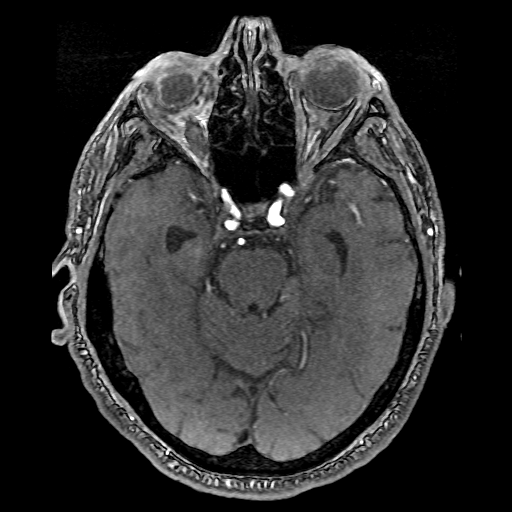
[im 91/160]
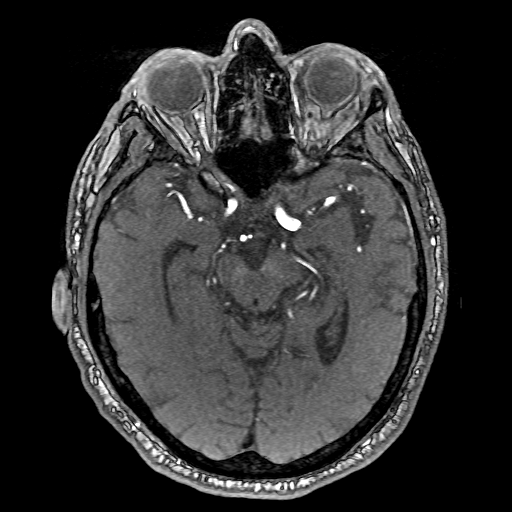
[im 109/160]
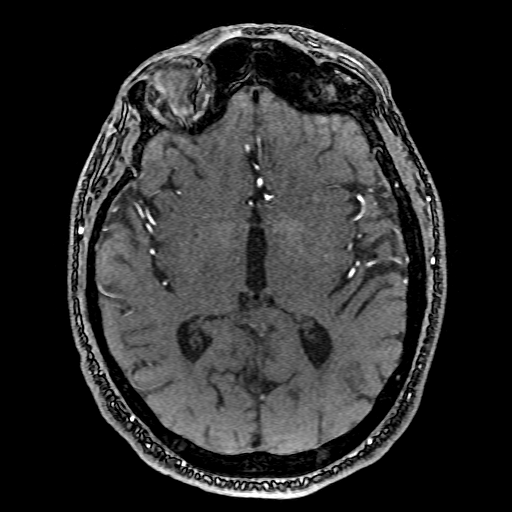
[im 131/160]
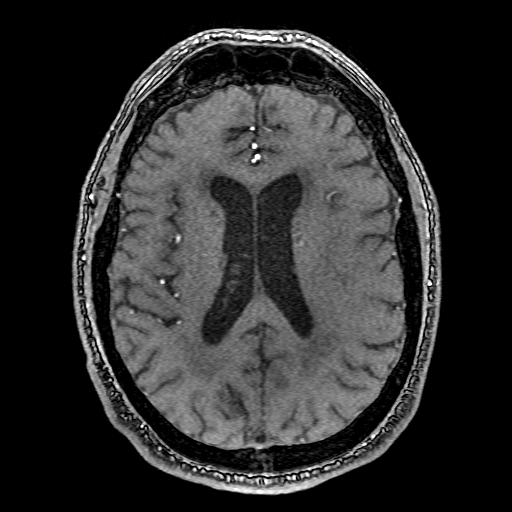
[im 134/160]
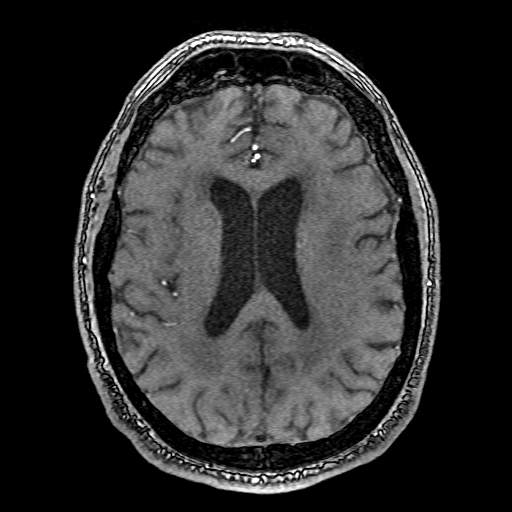
[im 152/160]
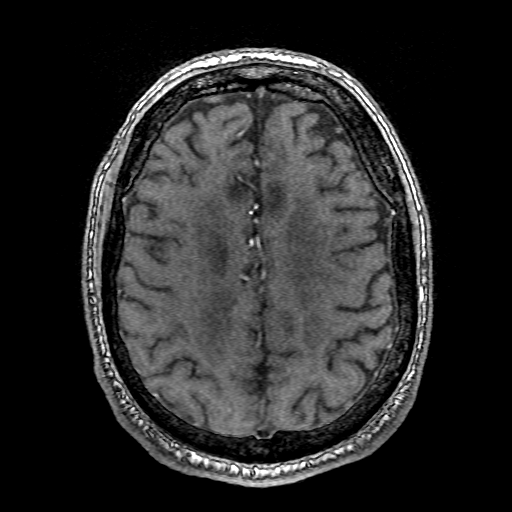

[Series 301: MRA · axial · 1.4mm · 0.27mm/px · 1 of 4 slices shown (1 of 2)]
[im 1/4]
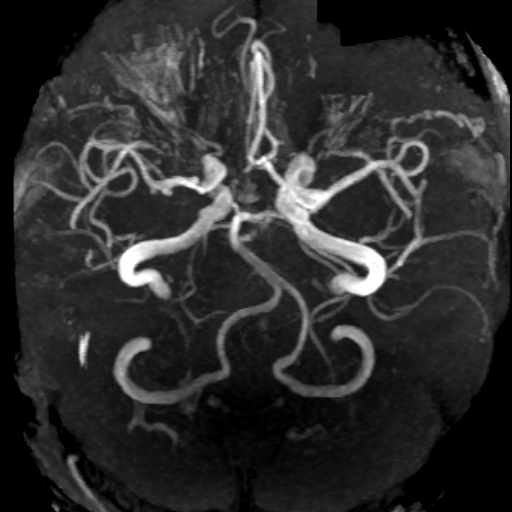

[Series 305: MRA · axial · 1.4mm · 0.27mm/px · z∈[-11,+51]mm · 2 of 7 slices shown (2 of 2)]
[im 1/7]
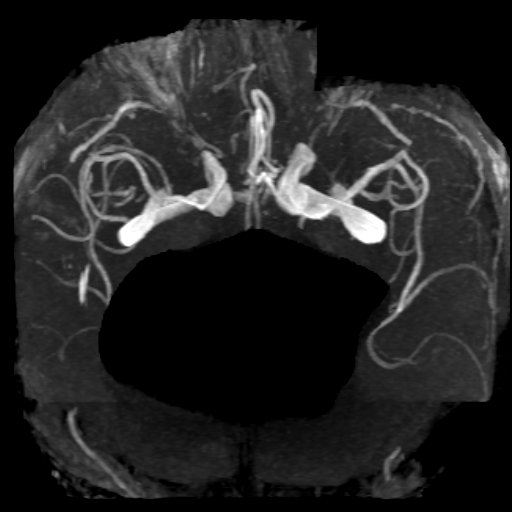
[im 7/7]
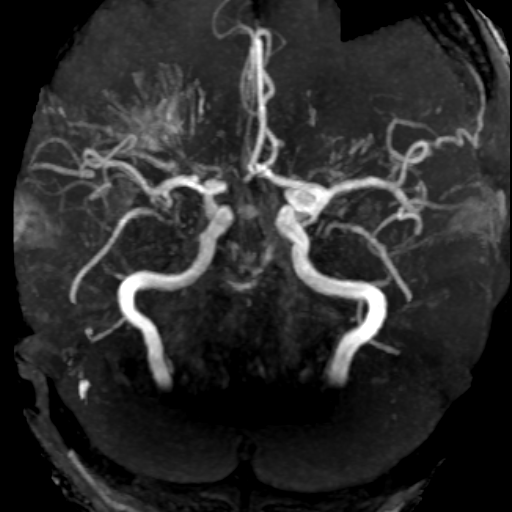

[18 of 48 positions shown; findings below may reference images not displayed]

FINDINGS: MRA NECK FINDINGS

Normal aortic branching.  No evidence of dissection or aneurysm.

There is likely calcification at the bifurcations of the bilateral
carotid arteries, which does not appear hemodynamically significant.
No occlusion or dissection.

Evaluation of the origins of the bilateral vertebral arteries is
limited. The vertebral arteries are otherwise patent without
stenosis, occlusion, or dissection.

MRA HEAD FINDINGS

Both internal carotid arteries are patent to the termini, without
stenosis or other abnormality.

Normal left A1. Aplastic versus occluded right A1. At the right
aspect of the anterior communicating artery, there is a right
posterolateral directed outpouching, measuring 1 x 2 mm (series 3,
image 96), which may represent the distal portion of an occluded
right A1 versus an infundibulum or small aneurysm. Possible variant
anatomy at the anterior communicating artery, with 3 vessels
ascending; anterior cerebral arteries are patent to their distal
aspects.

Mild narrowing in the right M1 (series 3, image 99). Normal left M1.
Normal MCA bifurcations. Distal MCA branches perfused and symmetric.

Vertebral arteries patent to the vertebrobasilar junction without
stenosis.

Basilar patent to its distal aspect. Superior cerebellar arteries
patent proximally bilaterally.

Possible duplication of the left PCA, with a patent left posterior
communicating artery as well as a patent left PCA. The right PCA is
diminutive, poorly perfused and mildly irregular (series 3, images
102, 90, 87, for example). The right posterior communicating artery
is not definitively visualized.
IMPRESSION: 1. Diminutive right PCA, which is poorly perfused and somewhat
irregular.
2. Aplastic versus occluded right A1, with a 1 x 2 mm posterolateral
projection from the right aspect of the anterior communicating
artery, which may reflect the distal portion of an occluded right A1
versus a small infundibulum or aneurysm. Attention on follow-up.
3. No hemodynamically significant stenosis in the neck.

## 2021-04-07 IMAGING — MR MR MRA NECK W/O CM
5 series · 27 of 48 positions shown · non-contrast
Comparison: None.

CLINICAL DATA: Stroke follow-up

EXAM:
MRA NECK WITHOUT CONTRAST
MRA HEAD WITHOUT CONTRAST
TECHNIQUE: Angiographic images of the Circle of Willis were acquired using MRA
technique without intravenous contrast.

[Series 3: ax (id) fspgr · axial · 2.2mm · 0.86mm/px · z∈[-247,-61]mm · 11 of 134 slices shown]
[im 1/134]
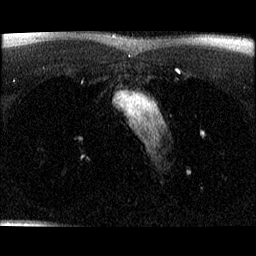
[im 14/134]
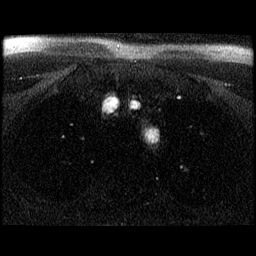
[im 27/134]
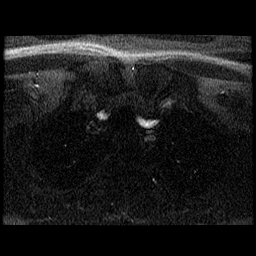
[im 40/134]
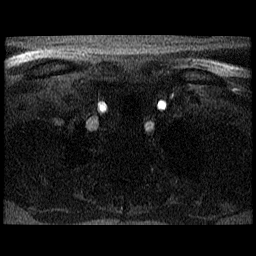
[im 54/134]
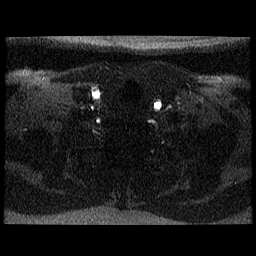
[im 67/134]
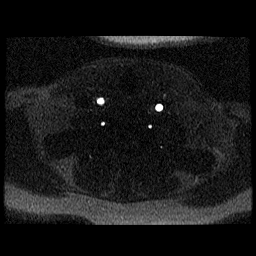
[im 80/134]
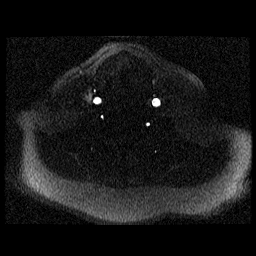
[im 94/134]
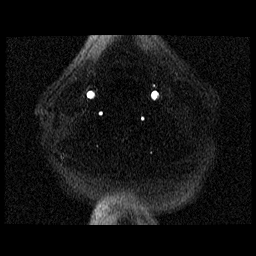
[im 107/134]
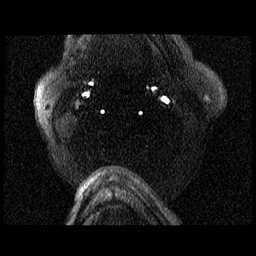
[im 120/134]
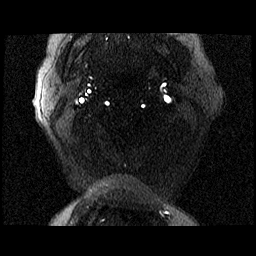
[im 134/134]
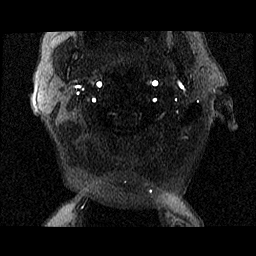

[Series 4: opt sag inhance · sagittal · 1.6mm · 0.47mm/px · 13 of 399 slices shown]
[im 1/399]
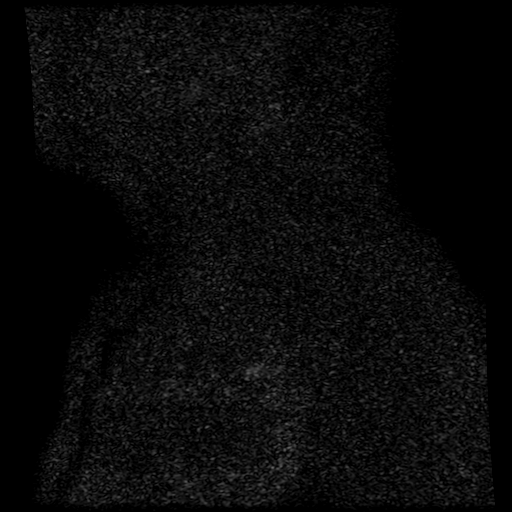
[im 13/399]
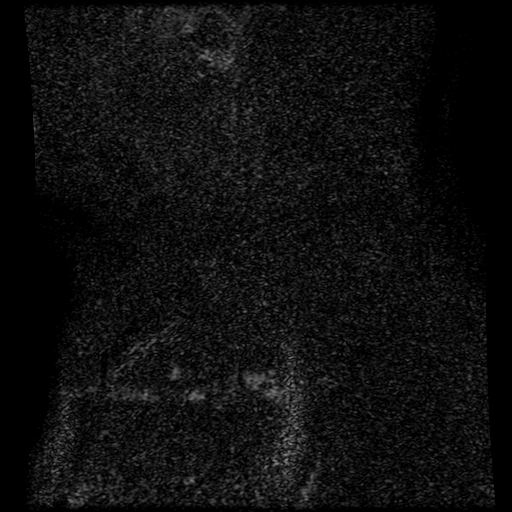
[im 25/399]
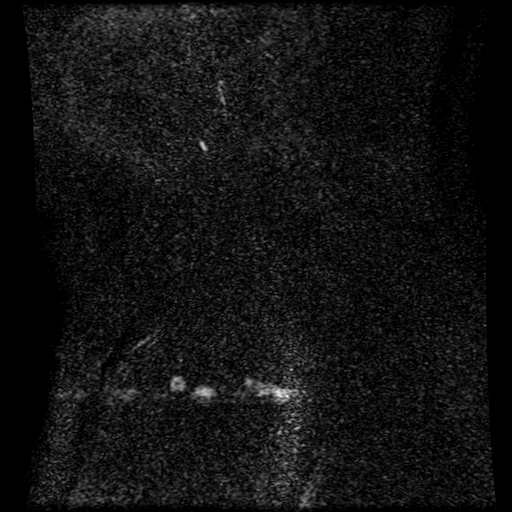
[im 61/399]
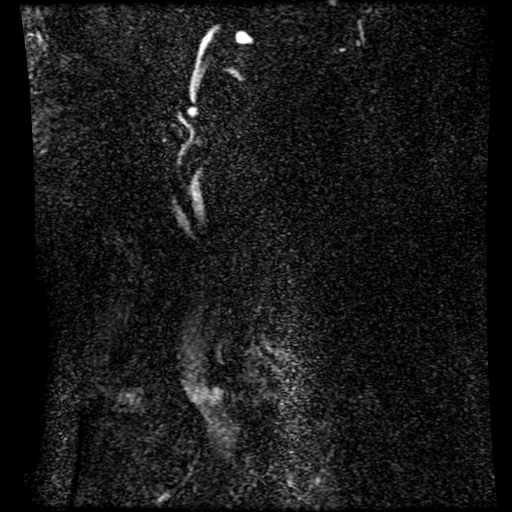
[im 73/399]
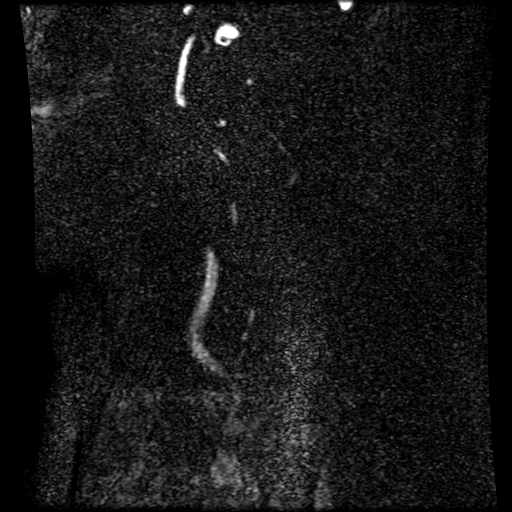
[im 121/399]
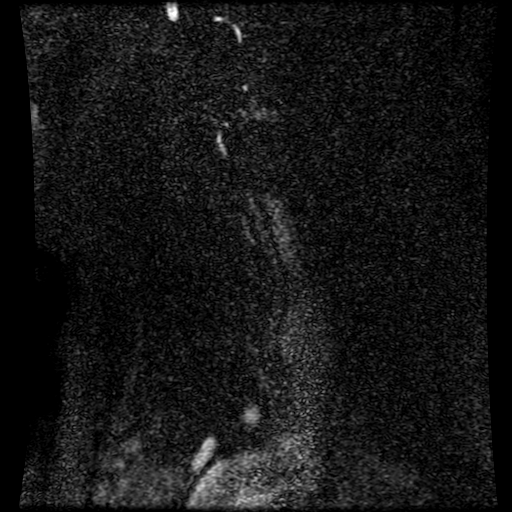
[im 169/399]
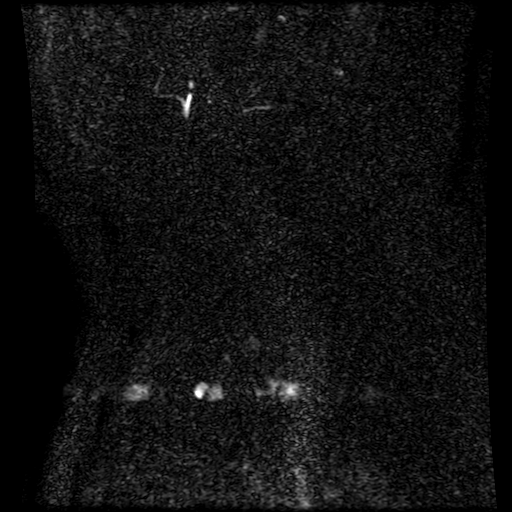
[im 206/399]
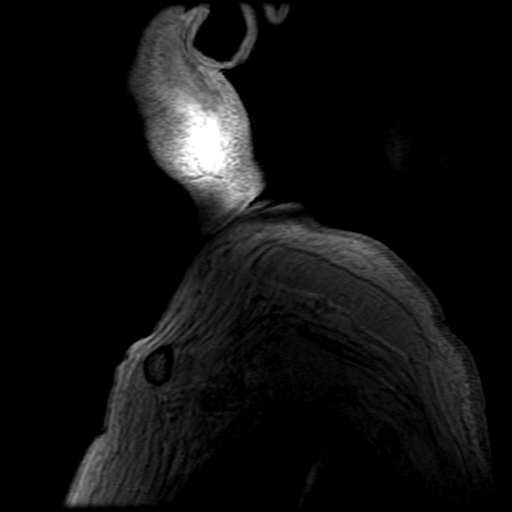
[im 230/399]
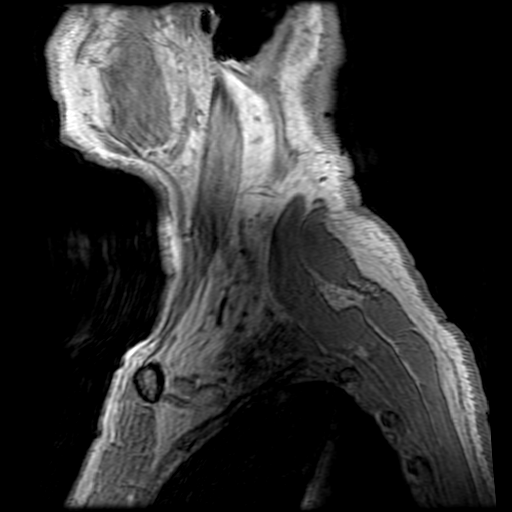
[im 278/399]
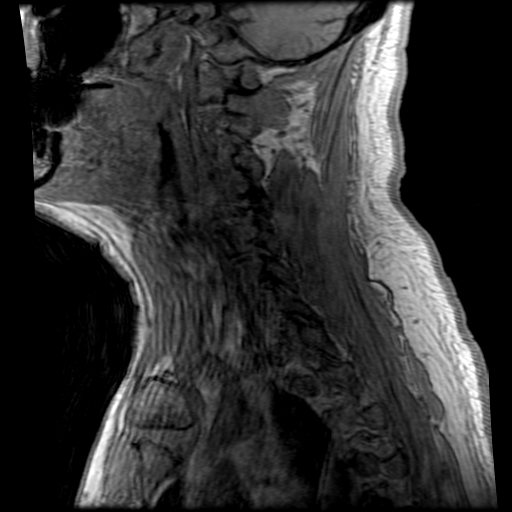
[im 326/399]
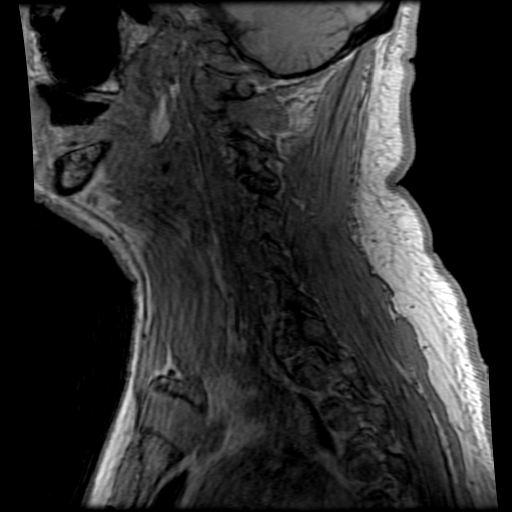
[im 338/399]
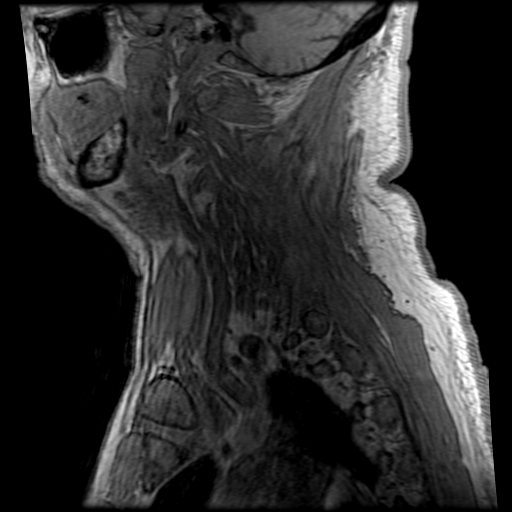
[im 374/399]
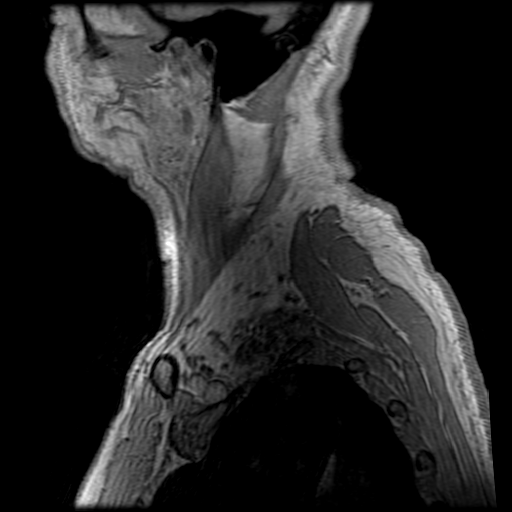

[Series 300: col:ax (id) fspgr · axial · 2.2mm · 0.86mm/px · 1 of 1 slices shown]
[im 1/1]
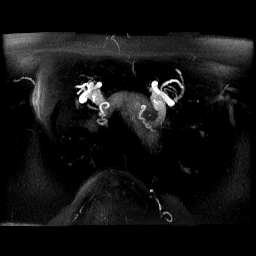

[Series 301: pjn:ax (id) fspgr · sagittal · 2.2mm · 0.86mm/px · 1 of 14 slices shown]
[im 1/14]
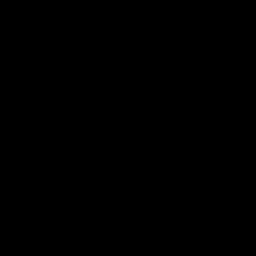

[Series 401: pjn:opt sag inhance · coronal · 1.6mm · 0.47mm/px · 1 of 5 slices shown]
[im 1/5]
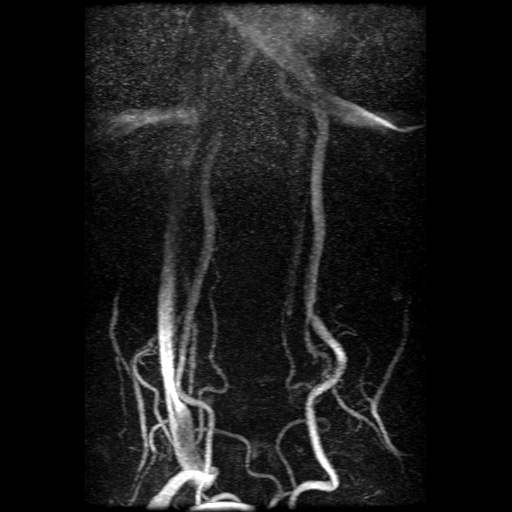

[27 of 48 positions shown; findings below may reference images not displayed]

FINDINGS: MRA NECK FINDINGS

Normal aortic branching.  No evidence of dissection or aneurysm.

There is likely calcification at the bifurcations of the bilateral
carotid arteries, which does not appear hemodynamically significant.
No occlusion or dissection.

Evaluation of the origins of the bilateral vertebral arteries is
limited. The vertebral arteries are otherwise patent without
stenosis, occlusion, or dissection.

MRA HEAD FINDINGS

Both internal carotid arteries are patent to the termini, without
stenosis or other abnormality.

Normal left A1. Aplastic versus occluded right A1. At the right
aspect of the anterior communicating artery, there is a right
posterolateral directed outpouching, measuring 1 x 2 mm (series 3,
image 96), which may represent the distal portion of an occluded
right A1 versus an infundibulum or small aneurysm. Possible variant
anatomy at the anterior communicating artery, with 3 vessels
ascending; anterior cerebral arteries are patent to their distal
aspects.

Mild narrowing in the right M1 (series 3, image 99). Normal left M1.
Normal MCA bifurcations. Distal MCA branches perfused and symmetric.

Vertebral arteries patent to the vertebrobasilar junction without
stenosis.

Basilar patent to its distal aspect. Superior cerebellar arteries
patent proximally bilaterally.

Possible duplication of the left PCA, with a patent left posterior
communicating artery as well as a patent left PCA. The right PCA is
diminutive, poorly perfused and mildly irregular (series 3, images
102, 90, 87, for example). The right posterior communicating artery
is not definitively visualized.
IMPRESSION: 1. Diminutive right PCA, which is poorly perfused and somewhat
irregular.
2. Aplastic versus occluded right A1, with a 1 x 2 mm posterolateral
projection from the right aspect of the anterior communicating
artery, which may reflect the distal portion of an occluded right A1
versus a small infundibulum or aneurysm. Attention on follow-up.
3. No hemodynamically significant stenosis in the neck.

## 2021-04-07 MED ORDER — ZOLPIDEM TARTRATE 5 MG PO TABS
5.0000 mg | ORAL_TABLET | Freq: Every evening | ORAL | Status: DC | PRN
Start: 2021-04-07 — End: 2021-04-09

## 2021-04-07 MED ORDER — LABETALOL HCL 5 MG/ML IV SOLN: 10.0000 mg | INTRAVENOUS | Status: DC | PRN

## 2021-04-07 MED ORDER — LOSARTAN POTASSIUM 50 MG PO TABS
50.0000 mg | ORAL_TABLET | Freq: Every day | ORAL | Status: DC
  Administered 2021-04-08: 50 mg via ORAL
  Filled 2021-04-07: qty 1

## 2021-04-07 MED ORDER — LORATADINE 10 MG PO TABS: 10.0000 mg | ORAL_TABLET | Freq: Every day | ORAL | Status: DC

## 2021-04-07 MED ORDER — CLOPIDOGREL BISULFATE 75 MG PO TABS
75.0000 mg | ORAL_TABLET | Freq: Every day | ORAL | Status: DC
  Administered 2021-04-07 – 2021-04-08 (×2): 75 mg via ORAL
  Filled 2021-04-07 (×2): qty 1

## 2021-04-07 MED ORDER — SODIUM CHLORIDE 0.9 % IV SOLN: Freq: Once | INTRAVENOUS | Status: AC

## 2021-04-07 MED ORDER — STROKE: EARLY STAGES OF RECOVERY BOOK
Freq: Once | Status: AC
  Filled 2021-04-07 (×2): qty 1

## 2021-04-07 MED ORDER — ACETAMINOPHEN 160 MG/5ML PO SOLN: 650.0000 mg | ORAL | Status: DC | PRN

## 2021-04-07 MED ORDER — PAROXETINE HCL 20 MG PO TABS
40.0000 mg | ORAL_TABLET | ORAL | Status: DC
  Administered 2021-04-08: 40 mg via ORAL
  Filled 2021-04-07: qty 2

## 2021-04-07 MED ORDER — LORATADINE 10 MG PO TABS
10.0000 mg | ORAL_TABLET | Freq: Every day | ORAL | Status: DC
  Administered 2021-04-08: 10 mg via ORAL
  Filled 2021-04-07: qty 1

## 2021-04-07 MED ORDER — FLUTICASONE PROPIONATE 50 MCG/ACT NA SUSP
2.0000 | Freq: Every day | NASAL | Status: DC
  Administered 2021-04-08: 2 via NASAL
  Filled 2021-04-07: qty 16

## 2021-04-07 MED ORDER — QUETIAPINE FUMARATE 50 MG PO TABS
50.0000 mg | ORAL_TABLET | Freq: Two times a day (BID) | ORAL | Status: DC
  Administered 2021-04-07 – 2021-04-08 (×2): 50 mg via ORAL
  Filled 2021-04-07 (×2): qty 1

## 2021-04-07 MED ORDER — ACETAMINOPHEN 650 MG RE SUPP: 650.0000 mg | RECTAL | Status: DC | PRN

## 2021-04-07 MED ORDER — ACETAMINOPHEN 325 MG PO TABS: 650.0000 mg | ORAL_TABLET | ORAL | Status: DC | PRN

## 2021-04-07 NOTE — ED Triage Notes (Signed)
Pt arrived via POV, states he was told to come due to abnormal MRI results. Denies any new weakness or issues at home. Endorses some left leg weakness since fall in October.

## 2021-04-07 NOTE — H&P (Signed)
History and Physical    Collin Bridges QBH:[id number] DOB: 1946-01-10 DOA: 04/07/2021  PCP: Collin Borg, MD  Patient coming from: home  Chief Complaint: abnormal MRI  HPI: Collin Bridges is a 75 y.o. male with medical history significant of HTN, anxiety, CKD3a. The patient reports that he had a fall in his bathroom about a month ago. He was brought to the ED for evaluation. A CTH at the time did not reveal an intracranial hemorrhage; but there was concern for a meningioma. An MRI was recommended and was set up in the outpatient setting for 04/03/21. It showed a 67mm mass that was compatible w/ meningioma. There was not mass effect or edema. It also showed subcentimeter subacute bilateral cerebral infarcts. It was recommended that he come to the ED for evaluation. Denies any current neurological symptoms.   ED Course: Neurology was consulted. They recommended admission to [facility]. [facility] was called for admission.   Review of Systems:  Denies CP, palpitations, dyspnea, lightheadedness, dizziness, N/V/D/F, abdominal pain. Review of systems is otherwise negative for all not mentioned in HPI.   PMHx Past Medical History:  Diagnosis Date   Allergy    seasonal   Anxiety    Depression    Headache    Hepatitis C    HLD (hyperlipidemia) 08/09/2017   Hypertension    Hypoglycemia     PSHx Past Surgical History:  Procedure Laterality Date   APPENDECTOMY     TONSILECTOMY, ADENOIDECTOMY, BILATERAL MYRINGOTOMY AND TUBES      [facility]  reports that he has quit smoking. He has never used smokeless tobacco. He reports that he does not drink alcohol and does not use drugs.  Allergies  Allergen Reactions   Bee Pollen     Nasal congestion     FamHx Family History  Problem Relation Age of Onset   Hyperlipidemia Father    Hypertension Father     Prior to Admission medications   Medication Sig Start Date End Date Taking? Authorizing Provider  amLODipine (NORVASC) 10 MG tablet  Take 1 tablet (10 mg total) by mouth daily. 10/03/19   Collin Borg, MD  aspirin 325 MG EC tablet Take 1 tablet (325 mg total) by mouth daily. 03/11/21   Collin Borg, MD  fluticasone [facility]) 50 MCG/ACT nasal spray USE 2 SPRAYS IN [facility] NOSTRIL DAILY 01/06/20   Collin Borg, MD  loratadine (CLARITIN) 10 MG tablet Take 1 tablet (10 mg total) by mouth daily. 05/27/19   Collin Borg, MD  losartan (COZAAR) 50 MG tablet Take 1 tablet (50 mg total) by mouth daily. 05/29/20   Collin Borg, MD  PARoxetine (PAXIL) 40 MG tablet Take 1 tablet (40 mg total) by mouth every morning. 05/29/20   Collin Borg, MD  QUEtiapine (SEROQUEL) 50 MG tablet Take 1 tablet (50 mg total) by mouth 2 (two) times daily. 05/29/20   Collin Borg, MD  traMADol (ULTRAM) 50 MG tablet Take 1 tablet (50 mg total) by mouth every 6 (six) hours as needed. 03/11/21   Collin Borg, MD  zolpidem (AMBIEN) 10 MG tablet TAKE 1 TABLET(10 MG) BY MOUTH AT BEDTIME AS NEEDED FOR SLEEP 03/14/21   Collin Borg, MD    Physical Exam: Vitals:   04/07/21 1042 04/07/21 1200 04/07/21 1214  BP: (!) 185/104 (!) 184/105 (!) 173/93  Pulse: (!) 107 100 90  Resp: 18 16 (!) 24  Temp: 97.8 F (36.6 C)  98.1 F (36.7  C)  TempSrc: Oral  Oral  SpO2: 97% 99% 95%  Weight:   86.6 kg  Height:   6' (1.829 m)    General: 75 y.o. male resting in bed in NAD Eyes: PERRL, normal sclera ENMT: Nares patent w/o discharge, orophaynx clear, dentition normal, ears w/o discharge/lesions/ulcers Neck: Supple, trachea midline Cardiovascular: RRR, +S1, S2, no m/g/r, equal pulses throughout Respiratory: CTABL, no w/r/r, normal WOB GI: BS+, NDNT, no masses noted, no organomegaly noted MSK: No e/c/c Skin: No rashes, bruises, ulcerations noted Neuro: A&O x 3, no focal deficits Psyc: Appropriate interaction and affect, calm/cooperative  Labs on Admission: I have personally reviewed following labs and imaging studies  CBC: Recent Labs  Lab 04/07/21 1143  WBC 6.6   NEUTROABS 4.6  HGB 13.9  HCT 40.3  MCV 95.5  PLT 852*   Basic Metabolic Panel: Recent Labs  Lab 04/07/21 1143  NA 138  K 4.5  CL 103  CO2 28  GLUCOSE 227*  BUN 13  CREATININE 1.37*  CALCIUM 9.1   GFR: Estimated Creatinine Clearance: 50.3 mL/min (A) (by C-G formula based on SCr of 1.37 mg/dL (H)). Liver Function Tests: Recent Labs  Lab 04/07/21 1143  AST 35  ALT 22  ALKPHOS 119  BILITOT 0.7  PROT 7.5  ALBUMIN 4.3   No results for input(s): LIPASE, AMYLASE in the last 168 hours. No results for input(s): AMMONIA in the last 168 hours. Coagulation Profile: Recent Labs  Lab 04/07/21 1143  INR 1.0   Cardiac Enzymes: No results for input(s): CKTOTAL, CKMB, CKMBINDEX, TROPONINI in the last 168 hours. BNP (last 3 results) No results for input(s): PROBNP in the last 8760 hours. HbA1C: No results for input(s): HGBA1C in the last 72 hours. CBG: No results for input(s): GLUCAP in the last 168 hours. Lipid Profile: No results for input(s): CHOL, HDL, LDLCALC, TRIG, CHOLHDL, LDLDIRECT in the last 72 hours. Thyroid Function Tests: No results for input(s): TSH, T4TOTAL, FREET4, T3FREE, THYROIDAB in the last 72 hours. Anemia Panel: No results for input(s): VITAMINB12, FOLATE, FERRITIN, TIBC, IRON, RETICCTPCT in the last 72 hours. Urine analysis:    Component Value Date/Time   COLORURINE YELLOW 05/22/2020 [place] 05/22/2020 0937   LABSPEC 1.010 05/22/2020 0937   PHURINE 7.0 05/22/2020 0937   GLUCOSEU >=1000 (A) 05/22/2020 0937   HGBUR NEGATIVE 05/22/2020 0937   BILIRUBINUR NEGATIVE 05/22/2020 0937   KETONESUR NEGATIVE 05/22/2020 0937   UROBILINOGEN 0.2 05/22/2020 0937   NITRITE NEGATIVE 05/22/2020 0937   LEUKOCYTESUR NEGATIVE 05/22/2020 7782    Radiological Exams on Admission: No results found.  EKG: Independently reviewed. Sinus, no st elevations  Assessment/Plan Bilateral subacute cerebral infarcts Cerebral meningioma     - admit to  inpt, tele @ [facility]     - neurology consulted; appreciate assistance     - echo ordered; further imaging per neuro     - A1c, lipid panel     - permissive HTN for now     - SLP/PT/OT     - started on DAPT by neuro     - as for the meningioma, imaging does not show edema or mass effect; will monitor at this point and await final neuro recs  HTN     - permissive HTN for now  Anxiety     - resume home regimen  CKD3a     - he is at baseline, follow  DVT prophylaxis: SCDs  Code Status: FULL  Family Communication: None at  bedside  Consults called: Neurology (Dr. Quinn Axe)   Status is: Inpatient  Remains inpatient appropriate because: severity of illness  Collin Bridges A Serrita Lueth DO [facility]  If 7PM-7AM, please contact night-coverage [email or web address]  04/07/2021, 12:21 PM

## 2021-04-07 NOTE — ED Provider Notes (Signed)
[facility] DEPT Provider Note   CSN: [id number] Arrival date & time: 04/07/21  1033     History Chief Complaint  Patient presents with   Abnormal MRI    Collin Bridges is a 75 y.o. male.  HPI     75 year old male sent to the ER by his PCP with the discovery of subacute strokes.  Patient reports that last month he had a mechanical fall that landed him in the ER.  He had CT scan of his brain that uncovered incidental finding of meningioma.  Thereafter, patient saw his PCP and had an MRI ordered which was completed yesterday.  There were some incidental strokes on the CT scan, MRI uncovered more areas of stroke and he was advised to come to the ER.  Patient denies any new focal numbness, weakness, balance issues, slurred speech.  He does indicate that he has had some visual disturbance in the past, but that was because of hypoglycemia.  Patient has history of hypertension.  He takes full dose aspirin for the last several years.  Past Medical History:  Diagnosis Date   Allergy    seasonal   Anxiety    Depression    Headache    Hepatitis C    HLD (hyperlipidemia) 08/09/2017   Hypertension    Hypoglycemia     Patient Active Problem List   Diagnosis Date Noted   CVA (cerebral vascular accident) ([place]) 04/07/2021   Brain mass 03/17/2021   Headache 03/17/2021   Cerebral atrophy ([place]) 03/17/2021   Stroke of right basal ganglia ([place]) 03/17/2021   Forehead pain 03/17/2021   Increased prostate specific antigen (PSA) velocity 12/14/2020   CKD (chronic kidney disease) stage 3, GFR 30-59 ml/min ([place]) 12/14/2020   HLD (hyperlipidemia) 08/09/2017   Diabetes ([place]) 08/09/2017   Encounter for well adult exam with abnormal findings 07/29/2016   Aortic stenosis, mild 07/29/2016   Insomnia 07/29/2016   Erectile dysfunction 03/30/2016   Anxiety disorder 01/06/2016   Benign prostatic hyperplasia with urinary frequency 01/06/2016   Essential  hypertension 02/02/2012   Hypoglycemia June 14, 1945    Past Surgical History:  Procedure Laterality Date   APPENDECTOMY     TONSILECTOMY, ADENOIDECTOMY, BILATERAL MYRINGOTOMY AND TUBES         Family History  Problem Relation Age of Onset   Hyperlipidemia Father    Hypertension Father     Social History   Tobacco Use   Smoking status: Former   Smokeless tobacco: Never   Tobacco comments:    quit 50yrs ago, smoke a cigar occassionally.  Substance Use Topics   Alcohol use: No   Drug use: No    Home Medications Prior to Admission medications   Medication Sig Start Date End Date Taking? Authorizing Provider  aspirin 325 MG EC tablet Take 1 tablet (325 mg total) by mouth daily. 03/11/21  Yes Biagio Borg, MD  fluticasone (FLONASE) 50 MCG/ACT nasal spray USE 2 SPRAYS IN EACH NOSTRIL DAILY Patient taking differently: 2 sprays daily. 01/06/20  Yes Biagio Borg, MD  ibuprofen (ADVIL) 200 MG tablet Take 200 mg by mouth every 6 (six) hours as needed for mild pain.   Yes [provider]  loratadine (CLARITIN) 10 MG tablet Take 1 tablet (10 mg total) by mouth daily. 05/27/19  Yes Biagio Borg, MD  losartan (COZAAR) 50 MG tablet Take 1 tablet (50 mg total) by mouth daily. 05/29/20  Yes Biagio Borg, MD  PARoxetine (PAXIL) 40 MG  tablet Take 1 tablet (40 mg total) by mouth every morning. 05/29/20  Yes Biagio Borg, MD  QUEtiapine (SEROQUEL) 50 MG tablet Take 1 tablet (50 mg total) by mouth 2 (two) times daily. 05/29/20  Yes Biagio Borg, MD  zolpidem (AMBIEN) 10 MG tablet TAKE 1 TABLET(10 MG) BY MOUTH AT BEDTIME AS NEEDED FOR SLEEP Patient taking differently: Take 5-10 mg by mouth at bedtime as needed for sleep. 03/14/21  Yes Biagio Borg, MD  traMADol (ULTRAM) 50 MG tablet Take 1 tablet (50 mg total) by mouth every 6 (six) hours as needed. Patient not taking: Reported on 04/07/2021 03/11/21   Biagio Borg, MD    Allergies    Patient has no active allergies.  Review of  Systems   Review of Systems  Constitutional:  Positive for activity change.  Eyes:  Negative for visual disturbance.  Gastrointestinal:  Negative for nausea and vomiting.  Neurological:  Negative for dizziness, facial asymmetry and headaches.  All other systems reviewed and are negative.  Physical Exam Updated Vital Signs BP (!) 186/113 (BP Location: Left Arm)   Pulse 86   Temp 97.9 F (36.6 C) (Oral)   Resp 16   Ht 6' (1.829 m)   Wt 86.6 kg   SpO2 98%   BMI 25.90 kg/m   Physical Exam Vitals and nursing note reviewed.  Constitutional:      Appearance: He is well-developed.  HENT:     Head: Atraumatic.  Eyes:     Extraocular Movements: Extraocular movements intact.     Pupils: Pupils are equal, round, and reactive to light.  Cardiovascular:     Rate and Rhythm: Normal rate.  Pulmonary:     Effort: Pulmonary effort is normal.  Musculoskeletal:     Cervical back: Neck supple.  Skin:    General: Skin is warm.  Neurological:     Mental Status: He is alert and oriented to person, place, and time.     Cranial Nerves: No cranial nerve deficit.     Sensory: No sensory deficit.     Motor: No weakness.     Coordination: Coordination normal.    ED Results / Procedures / Treatments   Labs (all labs ordered are listed, but only abnormal results are displayed) Labs Reviewed  CBC - Abnormal; Notable for the following components:      Result Value   Platelets 135 (*)    All other components within normal limits  COMPREHENSIVE METABOLIC PANEL - Abnormal; Notable for the following components:   Glucose, Bld 227 (*)    Creatinine, Ser 1.37 (*)    GFR, Estimated 53 (*)    All other components within normal limits  RAPID URINE DRUG SCREEN, HOSP PERFORMED - Abnormal; Notable for the following components:   Tetrahydrocannabinol POSITIVE (*)    All other components within normal limits  HEMOGLOBIN A1C - Abnormal; Notable for the following components:   Hgb A1c MFr Bld 5.8 (*)     All other components within normal limits  LIPID PANEL - Abnormal; Notable for the following components:   Cholesterol 246 (*)    Triglycerides 477 (*)    HDL 34 (*)    All other components within normal limits  GLUCOSE, CAPILLARY - Abnormal; Notable for the following components:   Glucose-Capillary 167 (*)    All other components within normal limits  LDL CHOLESTEROL, DIRECT - Abnormal; Notable for the following components:   Direct LDL 110.6 (*)  All other components within normal limits  GLUCOSE, CAPILLARY - Abnormal; Notable for the following components:   Glucose-Capillary 147 (*)    All other components within normal limits  RESP PANEL BY RT-PCR (FLU A&B, COVID) ARPGX2  PROTIME-INR  APTT  DIFFERENTIAL    EKG EKG Interpretation  Date/Time:  Wednesday April 07 2021 11:56:09 EST Ventricular Rate:  92 PR Interval:  137 QRS Duration: 92 QT Interval:  358 QTC Calculation: 443 R Axis:   20 Text Interpretation: Sinus rhythm No acute changes No significant change since last tracing Confirmed by Varney Biles [phone]) on 04/07/2021 12:09:27 PM  Radiology MR ANGIO HEAD WO CONTRAST  Result Date: 04/07/2021 CLINICAL DATA:  Stroke follow-up EXAM: MRA NECK WITHOUT CONTRAST MRA HEAD WITHOUT CONTRAST TECHNIQUE: Angiographic images of the Circle of Willis were acquired using MRA technique without intravenous contrast. COMPARISON:  None. FINDINGS: MRA NECK FINDINGS Normal aortic branching.  No evidence of dissection or aneurysm. There is likely calcification at the bifurcations of the bilateral carotid arteries, which does not appear hemodynamically significant. No occlusion or dissection. Evaluation of the origins of the bilateral vertebral arteries is limited. The vertebral arteries are otherwise patent without stenosis, occlusion, or dissection. MRA HEAD FINDINGS Both internal carotid arteries are patent to the termini, without stenosis or other abnormality. Normal left A1.  Aplastic versus occluded right A1. At the right aspect of the anterior communicating artery, there is a right posterolateral directed outpouching, measuring 1 x 2 mm (series 3, image 96), which may represent the distal portion of an occluded right A1 versus an infundibulum or small aneurysm. Possible variant anatomy at the anterior communicating artery, with 3 vessels ascending; anterior cerebral arteries are patent to their distal aspects. Mild narrowing in the right M1 (series 3, image 99). Normal left M1. Normal MCA bifurcations. Distal MCA branches perfused and symmetric. Vertebral arteries patent to the vertebrobasilar junction without stenosis. Basilar patent to its distal aspect. Superior cerebellar arteries patent proximally bilaterally. Possible duplication of the left PCA, with a patent left posterior communicating artery as well as a patent left PCA. The right PCA is diminutive, poorly perfused and mildly irregular (series 3, images 102, 90, 87, for example). The right posterior communicating artery is not definitively visualized. IMPRESSION: 1. Diminutive right PCA, which is poorly perfused and somewhat irregular. 2. Aplastic versus occluded right A1, with a 1 x 2 mm posterolateral projection from the right aspect of the anterior communicating artery, which may reflect the distal portion of an occluded right A1 versus a small infundibulum or aneurysm. Attention on follow-up. 3. No hemodynamically significant stenosis in the neck. Electronically Signed   By: Merilyn Baba M.D.   On: 04/07/2021 22:01   MR ANGIO NECK WO CONTRAST  Result Date: 04/07/2021 CLINICAL DATA:  Stroke follow-up EXAM: MRA NECK WITHOUT CONTRAST MRA HEAD WITHOUT CONTRAST TECHNIQUE: Angiographic images of the Circle of Willis were acquired using MRA technique without intravenous contrast. COMPARISON:  None. FINDINGS: MRA NECK FINDINGS Normal aortic branching.  No evidence of dissection or aneurysm. There is likely calcification at  the bifurcations of the bilateral carotid arteries, which does not appear hemodynamically significant. No occlusion or dissection. Evaluation of the origins of the bilateral vertebral arteries is limited. The vertebral arteries are otherwise patent without stenosis, occlusion, or dissection. MRA HEAD FINDINGS Both internal carotid arteries are patent to the termini, without stenosis or other abnormality. Normal left A1. Aplastic versus occluded right A1. At the right aspect of the anterior communicating artery,  there is a right posterolateral directed outpouching, measuring 1 x 2 mm (series 3, image 96), which may represent the distal portion of an occluded right A1 versus an infundibulum or small aneurysm. Possible variant anatomy at the anterior communicating artery, with 3 vessels ascending; anterior cerebral arteries are patent to their distal aspects. Mild narrowing in the right M1 (series 3, image 99). Normal left M1. Normal MCA bifurcations. Distal MCA branches perfused and symmetric. Vertebral arteries patent to the vertebrobasilar junction without stenosis. Basilar patent to its distal aspect. Superior cerebellar arteries patent proximally bilaterally. Possible duplication of the left PCA, with a patent left posterior communicating artery as well as a patent left PCA. The right PCA is diminutive, poorly perfused and mildly irregular (series 3, images 102, 90, 87, for example). The right posterior communicating artery is not definitively visualized. IMPRESSION: 1. Diminutive right PCA, which is poorly perfused and somewhat irregular. 2. Aplastic versus occluded right A1, with a 1 x 2 mm posterolateral projection from the right aspect of the anterior communicating artery, which may reflect the distal portion of an occluded right A1 versus a small infundibulum or aneurysm. Attention on follow-up. 3. No hemodynamically significant stenosis in the neck. Electronically Signed   By: Merilyn Baba M.D.   On:  04/07/2021 22:01    Procedures Procedures   Medications Ordered in ED Medications  losartan (COZAAR) tablet 50 mg (has no administration in time range)  PARoxetine (PAXIL) tablet 40 mg (40 mg Oral Given 04/08/21 0650)  QUEtiapine (SEROQUEL) tablet 50 mg (50 mg Oral Given 04/07/21 2128)  zolpidem (AMBIEN) tablet 5-10 mg (has no administration in time range)  fluticasone (FLONASE) 50 MCG/ACT nasal spray 2 spray (has no administration in time range)  acetaminophen (TYLENOL) tablet 650 mg (has no administration in time range)    Or  acetaminophen (TYLENOL) 160 MG/5ML solution 650 mg (has no administration in time range)    Or  acetaminophen (TYLENOL) suppository 650 mg (has no administration in time range)  loratadine (CLARITIN) tablet 10 mg (has no administration in time range)  labetalol (NORMODYNE) injection 10 mg (has no administration in time range)  clopidogrel (PLAVIX) tablet 75 mg (75 mg Oral Given 04/07/21 1848)   stroke: mapping our early stages of recovery book ( Does not apply Given 04/07/21 1849)  0.9 %  sodium chloride infusion (0 mLs Intravenous Stopped 04/08/21 0450)    ED Course  I have reviewed the triage vital signs and the nursing notes.  Pertinent labs & imaging results that were available during my care of the patient were reviewed by me and considered in my medical decision making (see chart for details).    MDM Rules/Calculators/A&P                           75 year old male comes in with chief complaint of abnormal MRI.  He had an MRI ordered because of a meningioma.  On the MRI, patient was found to have subacute infarcts.  Patient is currently asymptomatic.  He is taking full dose aspirin and has history of hypertension.  Denies any heavy smoking or drug use.  No history of any CAD.  He has no neurodeficits.  Discussed case with Dr. Quinn Axe, neurology.  She recommends that patient be admitted to [name], ER.  Since patient is on full dose aspirin, she  would want patient to be switched to 81 mg aspirin plus Plavix.  Final Clinical Impression(s) / ED  Diagnoses Final diagnoses:  Cerebrovascular accident (CVA) due to embolism of precerebral artery [facility])    Rx / DC Orders ED Discharge Orders     None        Varney Biles, MD 04/08/21 [phone]

## 2021-04-07 NOTE — Consult Note (Signed)
Neurology Consultation  Reason for Consult: Subacute strokes on MRI brain outpatient Referring Physician: Dr. Marylyn Ishihara  CC: No complaints at this time  History is obtained from: Patient, chart review  HPI: Collin Bridges is a 75 y.o. male with a medical history significant for anxiety, depression, hepatitis C, chronic kidney disease stage IIIa, hyperlipidemia, essential hypertension who presented to the ED 11/23 after being sent by his primary care provider due to abnormal MRI report results.  Patient states that he fell on March 10, 2021 in the shower after he slipped sustaining a laceration requiring sutures to the right frontal scalp.  At that time CT imaging revealed a small meningioma below the anterior aspect of the tentorium on the left and was discharged with recommendation for MRI follow-up.  Patient had MRI brain outpatient on 11/19 with results on 11/22 revealing subcentimeter subacute bilateral cerebral infarcts with a 12 mm mass along the left tentorium compatible with a meningioma without mass-effect or edema.  MRI imaging also revealed severe chronic small vessel ischemic disease and patient was asked by his primary provider to come to the ED for further evaluation and work-up of suspected stroke.   At baseline patient lives independently, states he does not have any living family members and he does not drive.  He is able to complete his ADLs independently and walks without a walker.  Patient states that he has been on 325 mg of aspirin for approximately 30 years.  He also endorses blurry vision after falling that resolved after 5 days.  ROS: A complete ROS was performed and is negative except as noted in the HPI.   Past Medical History:  Diagnosis Date   Allergy    seasonal   Anxiety    Depression    Headache    Hepatitis C    HLD (hyperlipidemia) 08/09/2017   Hypertension    Hypoglycemia    Past Surgical History:  Procedure Laterality Date   APPENDECTOMY      TONSILECTOMY, ADENOIDECTOMY, BILATERAL MYRINGOTOMY AND TUBES     Family History  Problem Relation Age of Onset   Hyperlipidemia Father    Hypertension Father    Social History:   reports that he has quit smoking. He has never used smokeless tobacco. He reports that he does not drink alcohol and does not use drugs.  Medications  Current Facility-Administered Medications:     stroke: mapping our early stages of recovery book, , Does not apply, Once, Marylyn Ishihara, Tyrone A, DO   acetaminophen (TYLENOL) tablet 650 mg, 650 mg, Oral, Q4H PRN **OR** acetaminophen (TYLENOL) 160 MG/5ML solution 650 mg, 650 mg, Per Tube, Q4H PRN **OR** acetaminophen (TYLENOL) suppository 650 mg, 650 mg, Rectal, Q4H PRN, Marylyn Ishihara, Tyrone A, DO   [START ON 04/08/2021] fluticasone (FLONASE) 50 MCG/ACT nasal spray 2 spray, 2 spray, Each Nare, Daily, Kyle, Tyrone A, DO   labetalol (NORMODYNE) injection 10 mg, 10 mg, Intravenous, Q2H PRN, Cyndia Skeeters, Charlesetta Ivory, MD   [START ON 04/08/2021] loratadine (CLARITIN) tablet 10 mg, 10 mg, Oral, Daily, Kyle, Tyrone A, DO   [START ON 04/08/2021] losartan (COZAAR) tablet 50 mg, 50 mg, Oral, Daily, Kyle, Tyrone A, DO   [START ON 04/08/2021] PARoxetine (PAXIL) tablet 40 mg, 40 mg, Oral, BH-[id number], Kyle, Tyrone A, DO   QUEtiapine (SEROQUEL) tablet 50 mg, 50 mg, Oral, BID, Kyle, Tyrone A, DO   zolpidem (AMBIEN) tablet 5-10 mg, 5-10 mg, Oral, QHS PRN, Marylyn Ishihara, Tyrone A, DO  Exam: Current vital signs: BP (!) 192/112 (  BP Location: Left Arm) Comment: MD notified. PRN added for >220/120  Pulse 86   Temp 98.2 F (36.8 C) (Oral)   Resp 20   Ht 6' (1.829 m)   Wt 86.6 kg   SpO2 94%   BMI 25.90 kg/m  Vital signs in last 24 hours: Temp:  [97.8 F (36.6 C)-98.2 F (36.8 C)] 98.2 F (36.8 C) (11/23 1637) Pulse Rate:  [83-107] 86 (11/23 1637) Resp:  [16-25] 20 (11/23 1637) BP: (173-192)/(93-112) 192/112 (11/23 1637) SpO2:  [92 %-99 %] 94 % (11/23 1637) Weight:  [86.6 kg] 86.6 kg (11/23 1214)  GENERAL: Awake,  alert, in no acute distress, laying comfortably in hospital bed Psych: Affect appropriate for situation, patient is calm and cooperative with examination Head: Normocephalic and atraumatic, without obvious abnormality EENT: Normal conjunctivae, dry mucous membranes, no OP obstruction LUNGS: Normal respiratory effort. Non-labored breathing on room air CV: Regular rate and rhythm on telemetry ABDOMEN: Soft, non-tender, non-distended Extremities: warm, well perfused, without obvious deformity  NEURO:  Mental Status: Awake, alert, and oriented to person, place, time, and situation.  He is able to state his age and the month correctly however he states incorrectly the year is 2020. He is able to provide a clear and coherent history of present illness. Speech/Language: speech is intact without dysarthria.   Naming, repetition, fluency, and comprehension intact without aphasia. No neglect is noted Cranial Nerves:  II: PERRL 4 mm/brisk. Visual fields full.  III, IV, VI: EOMI without ptosis, nystagmus, gaze preference V: Sensation is intact to light touch and symmetrical to face.  VII: Face is symmetric resting and smiling.  VIII: Hearing is intact to voice IX, X: Palate elevation is symmetric. Phonation normal.  XI: Normal sternocleidomastoid and trapezius muscle strength XII: Tongue protrudes midline without fasciculations.   Motor: 5/5 strength is all muscle groups without vertical drift.  Tone is normal. Bulk is normal.  Sensation: Intact to light touch bilaterally in all four extremities. No extinction to DSS present.  Coordination: FTN intact bilaterally. HKS intact bilaterally.  DTRs: 2+ throughout.  Gait: Deferred  NIHSS: 0  Labs I have reviewed labs in epic and the results pertinent to this consultation are: CBC    Component Value Date/Time   WBC 6.6 04/07/2021 1143   RBC 4.22 04/07/2021 1143   HGB 13.9 04/07/2021 1143   HCT 40.3 04/07/2021 1143   PLT 135 (L) 04/07/2021 1143    MCV 95.5 04/07/2021 1143   MCH 32.9 04/07/2021 1143   MCHC 34.5 04/07/2021 1143   RDW 12.6 04/07/2021 1143   LYMPHSABS 1.2 04/07/2021 1143   MONOABS 0.4 04/07/2021 1143   EOSABS 0.3 04/07/2021 1143   BASOSABS 0.0 04/07/2021 1143   CMP     Component Value Date/Time   NA 138 04/07/2021 1143   K 4.5 04/07/2021 1143   CL 103 04/07/2021 1143   CO2 28 04/07/2021 1143   GLUCOSE 227 (H) 04/07/2021 1143   BUN 13 04/07/2021 1143   CREATININE 1.37 (H) 04/07/2021 1143   CALCIUM 9.1 04/07/2021 1143   PROT 7.5 04/07/2021 1143   ALBUMIN 4.3 04/07/2021 1143   AST 35 04/07/2021 1143   ALT 22 04/07/2021 1143   ALKPHOS 119 04/07/2021 1143   BILITOT 0.7 04/07/2021 1143   GFRNONAA 53 (L) 04/07/2021 1143   Lipid Panel     Component Value Date/Time   CHOL 238 (H) 12/14/2020 0916   TRIG (H) 12/14/2020 0916    448.0 Triglyceride is over 400; calculations  on Lipids are invalid.   HDL 38.20 (L) 12/14/2020 0916   CHOLHDL 6 12/14/2020 0916   VLDL 67.4 (H) 10/03/2019 0932   LDLCALC 178 (H) 07/29/2016 0953   LDLDIRECT 113.0 12/14/2020 0916   Lab Results  Component Value Date   HGBA1C 6.6 (H) 12/14/2020   Imaging I have reviewed the images obtained:  CT-scan of the brain 10/26: 1. Findings likely consistent with a small meningioma below the anterior aspect of the tentorium on the left. MRI correlation is recommended. 2. Small right frontal scalp soft tissue defect. 3. No acute intracranial abnormality.  MRI examination of the brain 11/19: 1. Suspected subcentimeter subacute bilateral cerebral infarcts. 2. 12 mm mass along the left tentorium compatible with a meningioma. No mass effect or edema. 3. Severe chronic small vessel ischemic disease.   Assessment: 75 year old male who presented to the ED as advised by his primary care provider after MRI outpatient revealed suspected subcentimeter subacute bilateral cerebral infarcts and a 12 mm mass along the left tentorium compatible with  meningioma without mass-effect or edema. - Examination reveals patient without focal neurologic deficit and an NIHSS of 0. - MRI imaging as above. - Patient stroke risk factors include hyperlipidemia, essential hypertension, advanced age, and CKD. - Due to the bilateral nature of infarcts in multiple vascular territories, patient's MRI suspicious for embolic etiology.  Patient is without known history of atrial fibrillation.  Will complete stroke work-up for further evaluation.  Recommendations: - HgbA1c, fasting lipid panel - MRA head and neck - Frequent neuro checks - Echocardiogram pending - Prophylactic therapy- Antiplatelet med: Aspirin - dose 325mg  PO or 300mg  PR followed by ASA 81 mg PO daily in addition to clopidogrel 75 mg PO daily for 21 days.  - Risk factor modification - Telemetry monitoring; will likely need 30-day event monitoring if no arrhythmias captured inpatient  - Stroke team to follow  Pt seen by NP/Neuro and later by MD. Note/plan to be edited by MD as needed.  Anibal Henderson, AGAC-NP [facility] Pager: [phone]  Neurology Attending Attestation   I examined the patient and discussed plan with Ms. Toberman. Above note has been edited by me to reflect my findings and recommendations. I personally reviewed CNS imaging. Patient has NIHSS = 0 and I suspect both his multifocal subcentimeter subacute infarcts and his meningioma are incidental findings. The fact that his infarcts or bilateral suggests central embolic source and we recommend stroke workup above (orders placed). Meningioma requires no further workup or mgmt at this time and may be followed up by outpatient neurology. Stroke team will continue to follow.   Su Monks, MD [facility] [phone]   If 7pm- 7am, please page neurology on call as listed in [place]. \

## 2021-04-08 ENCOUNTER — Observation Stay (HOSPITAL_BASED_OUTPATIENT_CLINIC_OR_DEPARTMENT_OTHER): Payer: Medicare (Managed Care)

## 2021-04-08 ENCOUNTER — Other Ambulatory Visit: Payer: Self-pay

## 2021-04-08 DIAGNOSIS — I6381 Other cerebral infarction due to occlusion or stenosis of small artery: Secondary | ICD-10-CM | POA: Diagnosis not present

## 2021-04-08 DIAGNOSIS — I6300 Cerebral infarction due to thrombosis of unspecified precerebral artery: Secondary | ICD-10-CM | POA: Diagnosis not present

## 2021-04-08 DIAGNOSIS — I10 Essential (primary) hypertension: Secondary | ICD-10-CM | POA: Diagnosis not present

## 2021-04-08 LAB — LIPID PANEL
Cholesterol: 246 mg/dL — ABNORMAL HIGH (ref 0–200)
HDL: 34 mg/dL — ABNORMAL LOW (ref >40)
LDL Cholesterol: UNDETERMINED mg/dL (ref 0–99)
Total CHOL/HDL Ratio: 7.2 RATIO
Triglycerides: 477 mg/dL — ABNORMAL HIGH (ref <150)
VLDL: UNDETERMINED mg/dL (ref 0–40)

## 2021-04-08 LAB — LDL CHOLESTEROL, DIRECT: Direct LDL: 110.6 mg/dL — ABNORMAL HIGH (ref 0–99)

## 2021-04-08 LAB — GLUCOSE, CAPILLARY: Glucose-Capillary: 147 mg/dL — ABNORMAL HIGH (ref 70–99)

## 2021-04-08 LAB — ECHOCARDIOGRAM COMPLETE
AR max vel: 1.57 cm2
AV Area VTI: 1.8 cm2
AV Area mean vel: 1.65 cm2
AV Mean grad: 10 mmHg
AV Peak grad: 16.3 mmHg
Ao pk vel: 2.02 m/s
Area-P 1/2: 4.68 cm2
Height: 72 in
S' Lateral: 2.7 cm
Weight: 3056 oz

## 2021-04-08 LAB — HEMOGLOBIN A1C
Hgb A1c MFr Bld: 5.8 % — ABNORMAL HIGH (ref 4.8–5.6)
Mean Plasma Glucose: 119.76 mg/dL

## 2021-04-08 MED ORDER — INFLUENZA VAC A&B SA ADJ QUAD 0.5 ML IM PRSY: 0.5000 mL | PREFILLED_SYRINGE | INTRAMUSCULAR | Status: DC

## 2021-04-08 MED ORDER — ASPIRIN EC 81 MG PO TBEC
81.0000 mg | DELAYED_RELEASE_TABLET | Freq: Every day | ORAL | Status: DC
  Administered 2021-04-08: 81 mg via ORAL
  Filled 2021-04-08: qty 1

## 2021-04-08 MED ORDER — CLOPIDOGREL BISULFATE 75 MG PO TABS: 75.0000 mg | ORAL_TABLET | Freq: Every day | ORAL | 2 refills | Status: DC

## 2021-04-08 MED ORDER — ATORVASTATIN CALCIUM 40 MG PO TABS: 40.0000 mg | ORAL_TABLET | Freq: Every day | ORAL | 2 refills | Status: DC

## 2021-04-08 MED ORDER — ASPIRIN 81 MG PO TBEC: 81.0000 mg | DELAYED_RELEASE_TABLET | Freq: Every day | ORAL | 0 refills | Status: AC

## 2021-04-08 MED ORDER — ASPIRIN EC 81 MG PO TBEC: 81.0000 mg | DELAYED_RELEASE_TABLET | Freq: Every day | ORAL | Status: DC

## 2021-04-08 MED ORDER — LOSARTAN POTASSIUM 50 MG PO TABS
100.0000 mg | ORAL_TABLET | Freq: Every day | ORAL | 1 refills | Status: DC
Start: 2021-04-08 — End: 2021-04-26

## 2021-04-08 MED ORDER — ATORVASTATIN CALCIUM 40 MG PO TABS
40.0000 mg | ORAL_TABLET | Freq: Every day | ORAL | Status: DC
  Administered 2021-04-08: 40 mg via ORAL
  Filled 2021-04-08: qty 1

## 2021-04-08 MED ORDER — CLOPIDOGREL BISULFATE 75 MG PO TABS: 75.0000 mg | ORAL_TABLET | Freq: Every day | ORAL | Status: DC

## 2021-04-08 NOTE — Progress Notes (Signed)
[facility] PROGRESS NOTE   Collin Bridges XTG:[id number] DOB: 1946-02-02 DOA: 04/07/2021  PCP: Biagio Borg, MD  Brief History/Interval Summary: 75 y.o. male with medical history significant of HTN, anxiety, CKD3a. The patient reports that he had a fall in his bathroom about a month ago. He was brought to the ED for evaluation. A CTH at the time did not reveal an intracranial hemorrhage; but there was concern for a meningioma. An MRI was recommended and was set up in the outpatient setting for 04/03/21. It showed a 54mm mass that was compatible w/ meningioma. There was not mass effect or edema. It also showed subcentimeter subacute bilateral cerebral infarcts. It was recommended that he come to the ED for evaluation. Denies any current neurological symptoms.    ED Course: Neurology was consulted. They recommended admission to [facility]. [facility] was called for admission.   Reason for Visit: Acute stroke  Consultants: Neurology  Procedures: Transthoracic echocardiogram is pending    Subjective/Interval History: Patient denies any speech difficulties, weakness in arms or legs.  No headaches currently.  Denies any nausea or vomiting.     Assessment/Plan:  Bilateral subacute cerebral infarcts Noted incidentally on MRI brain that was done to evaluate the meningioma that was seen on a recent CT scan.  Patient does not have any focal neurological deficits.  He was seen by neurology.  Underwent stroke work-up.  Underwent MRA head and neck. LDL is 110.  HbA1c 5.8. Patient is on atorvastatin. Discussed with neurology.  Recommendation is to do aspirin and Plavix for 3 weeks followed by Plavix alone.  Patient was taking aspirin previously. Seen by physical therapy.  No needs identified. Transthoracic echocardiogram is pending.  Meningioma Outpatient follow-up.  Essential hypertension Permissive hypertension is being allowed though this is this is a subacute stroke this is not  necessary.  Antihypertensives have been resumed.  He is noted to be on ARB.  High blood pressure readings noted.  May need to increase the dose of his ARB.  Chronic kidney disease stage IIIa Stable.    DVT Prophylaxis: SCDs Code Status: Full code Family Communication: Discussed with patient Disposition Plan: Hopefully return home when work-up is completed  Status is: Inpatient  May not be inpatient appropriate, will call [facility] team and discuss.      Medications: Scheduled:  [START ON 04/09/2021] aspirin EC  81 mg Oral Daily   atorvastatin  40 mg Oral Daily   [START ON 04/09/2021] clopidogrel  75 mg Oral Daily   fluticasone  2 spray Each Nare Daily   [START ON 04/09/2021] influenza vaccine adjuvanted  0.5 mL Intramuscular Tomorrow-1000   loratadine  10 mg Oral Daily   losartan  50 mg Oral Daily   PARoxetine  40 mg Oral BH-q7a   QUEtiapine  50 mg Oral BID   Continuous: EVO:[id number] **OR** acetaminophen (TYLENOL) oral liquid 160 mg/5 mL **OR** acetaminophen, labetalol, zolpidem  Antibiotics: Anti-infectives (From admission, onward)    None       Objective:  Vital Signs  Vitals:   04/07/21 2127 04/07/21 2353 04/08/21 0358 04/08/21 0735  BP: (!) 156/99 140/73 (!) 187/98 (!) 186/113  Pulse: 87 91 91 86  Resp: 18 17 17 16   Temp: 98.2 F (36.8 C) (!) 97.5 F (36.4 C) (!) 97.5 F (36.4 C) 97.9 F (36.6 C)  TempSrc: Oral Oral Oral Oral  SpO2: 96% 95% 96% 98%  Weight:      Height:  Intake/Output Summary (Last 24 hours) at 04/08/2021 1124 Last data filed at 04/07/2021 1800 Gross per 24 hour  Intake 360 ml  Output --  Net 360 ml   Filed Weights   04/07/21 1214  Weight: 86.6 kg    General appearance: Awake alert.  In no distress Resp: Clear to auscultation bilaterally.  Normal effort Cardio: S1-S2 is normal regular.  No S3-S4.  No rubs murmurs or bruit GI: Abdomen is soft.  Nontender nondistended.  Bowel sounds are present normal.  No masses  organomegaly Extremities: No edema.  Full range of motion of lower extremities. Neurologic: Alert and oriented x3.  No focal neurological deficits.    Lab Results:  Data Reviewed: I have personally reviewed following labs and imaging studies  CBC: Recent Labs  Lab 04/07/21 1143  WBC 6.6  NEUTROABS 4.6  HGB 13.9  HCT 40.3  MCV 95.5  PLT 135*    Basic Metabolic Panel: Recent Labs  Lab 04/07/21 1143  NA 138  K 4.5  CL 103  CO2 28  GLUCOSE 227*  BUN 13  CREATININE 1.37*  CALCIUM 9.1    GFR: Estimated Creatinine Clearance: 50.3 mL/min (A) (by C-G formula based on SCr of 1.37 mg/dL (H)).  Liver Function Tests: Recent Labs  Lab 04/07/21 1143  AST 35  ALT 22  ALKPHOS 119  BILITOT 0.7  PROT 7.5  ALBUMIN 4.3      Coagulation Profile: Recent Labs  Lab 04/07/21 1143  INR 1.0     HbA1C: Recent Labs    04/08/21 0102  HGBA1C 5.8*    CBG: Recent Labs  Lab 04/07/21 1949 04/08/21 0612  GLUCAP 167* 147*    Lipid Profile: Recent Labs    04/08/21 0102  CHOL 246*  HDL 34*  LDLCALC UNABLE TO CALCULATE IF TRIGLYCERIDE OVER 400 mg/dL  TRIG 477*  CHOLHDL 7.2  LDLDIRECT 110.6*      Recent Results (from the past 240 hour(s))  Resp Panel by RT-PCR (Flu A&B, Covid) Nasopharyngeal Swab     Status: None   Collection Time: 04/07/21 11:43 AM   Specimen: Nasopharyngeal Swab; Nasopharyngeal(NP) swabs in vial transport medium  Result Value Ref Range Status   SARS Coronavirus 2 by RT PCR NEGATIVE NEGATIVE Final    Comment: (NOTE) SARS-CoV-2 target nucleic acids are NOT DETECTED.  The SARS-CoV-2 RNA is generally detectable in upper respiratory specimens during the acute phase of infection. The lowest concentration of SARS-CoV-2 viral copies this assay can detect is 138 copies/mL. A negative result does not preclude SARS-Cov-2 infection and should not be used as the sole basis for treatment or other patient management decisions. A negative result may  occur with  improper specimen collection/handling, submission of specimen other than nasopharyngeal swab, presence of viral mutation(s) within the areas targeted by this assay, and inadequate number of viral copies(<138 copies/mL). A negative result must be combined with clinical observations, patient history, and epidemiological information. The expected result is Negative.  Fact Sheet for Patients:  EntrepreneurPulse.com.au  Fact Sheet for Healthcare Providers:  IncredibleEmployment.be  This test is no t yet approved or cleared by the [place] FDA and  has been authorized for detection and/or diagnosis of SARS-CoV-2 by FDA under an Emergency Use Authorization (EUA). This EUA will remain  in effect (meaning this test can be used) for the duration of the COVID-19 declaration under Section 564(b)(1) of the Act, 21 U.S.C.section 360bbb-3(b)(1), unless the authorization is terminated  or revoked sooner.  Influenza A by PCR NEGATIVE NEGATIVE Final   Influenza B by PCR NEGATIVE NEGATIVE Final    Comment: (NOTE) The Xpert Xpress SARS-CoV-2/FLU/RSV plus assay is intended as an aid in the diagnosis of influenza from Nasopharyngeal swab specimens and should not be used as a sole basis for treatment. Nasal washings and aspirates are unacceptable for Xpert Xpress SARS-CoV-2/FLU/RSV testing.  Fact Sheet for Patients: EntrepreneurPulse.com.au  Fact Sheet for Healthcare Providers: IncredibleEmployment.be  This test is not yet approved or cleared by the [place] FDA and has been authorized for detection and/or diagnosis of SARS-CoV-2 by FDA under an Emergency Use Authorization (EUA). This EUA will remain in effect (meaning this test can be used) for the duration of the COVID-19 declaration under Section 564(b)(1) of the Act, 21 U.S.C. section 360bbb-3(b)(1), unless the authorization is terminated  or revoked.  Performed at [facility], [place] [address] [place], [place] [address]       Radiology Studies: MR ANGIO HEAD WO CONTRAST  Result Date: 04/07/2021 CLINICAL DATA:  Stroke follow-up EXAM: MRA NECK WITHOUT CONTRAST MRA HEAD WITHOUT CONTRAST TECHNIQUE: Angiographic images of the Circle of Willis were acquired using MRA technique without intravenous contrast. COMPARISON:  None. FINDINGS: MRA NECK FINDINGS Normal aortic branching.  No evidence of dissection or aneurysm. There is likely calcification at the bifurcations of the bilateral carotid arteries, which does not appear hemodynamically significant. No occlusion or dissection. Evaluation of the origins of the bilateral vertebral arteries is limited. The vertebral arteries are otherwise patent without stenosis, occlusion, or dissection. MRA HEAD FINDINGS Both internal carotid arteries are patent to the termini, without stenosis or other abnormality. Normal left A1. Aplastic versus occluded right A1. At the right aspect of the anterior communicating artery, there is a right posterolateral directed outpouching, measuring 1 x 2 mm (series 3, image 96), which may represent the distal portion of an occluded right A1 versus an infundibulum or small aneurysm. Possible variant anatomy at the anterior communicating artery, with 3 vessels ascending; anterior cerebral arteries are patent to their distal aspects. Mild narrowing in the right M1 (series 3, image 99). Normal left M1. Normal MCA bifurcations. Distal MCA branches perfused and symmetric. Vertebral arteries patent to the vertebrobasilar junction without stenosis. Basilar patent to its distal aspect. Superior cerebellar arteries patent proximally bilaterally. Possible duplication of the left PCA, with a patent left posterior communicating artery as well as a patent left PCA. The right PCA is diminutive, poorly perfused and mildly irregular (series 3, images 102, 90, 87, for  example). The right posterior communicating artery is not definitively visualized. IMPRESSION: 1. Diminutive right PCA, which is poorly perfused and somewhat irregular. 2. Aplastic versus occluded right A1, with a 1 x 2 mm posterolateral projection from the right aspect of the anterior communicating artery, which may reflect the distal portion of an occluded right A1 versus a small infundibulum or aneurysm. Attention on follow-up. 3. No hemodynamically significant stenosis in the neck. Electronically Signed   By: Merilyn Baba M.D.   On: 04/07/2021 22:01   MR ANGIO NECK WO CONTRAST  Result Date: 04/07/2021 CLINICAL DATA:  Stroke follow-up EXAM: MRA NECK WITHOUT CONTRAST MRA HEAD WITHOUT CONTRAST TECHNIQUE: Angiographic images of the Circle of Willis were acquired using MRA technique without intravenous contrast. COMPARISON:  None. FINDINGS: MRA NECK FINDINGS Normal aortic branching.  No evidence of dissection or aneurysm. There is likely calcification at the bifurcations of the bilateral carotid arteries, which does not appear hemodynamically significant. No  occlusion or dissection. Evaluation of the origins of the bilateral vertebral arteries is limited. The vertebral arteries are otherwise patent without stenosis, occlusion, or dissection. MRA HEAD FINDINGS Both internal carotid arteries are patent to the termini, without stenosis or other abnormality. Normal left A1. Aplastic versus occluded right A1. At the right aspect of the anterior communicating artery, there is a right posterolateral directed outpouching, measuring 1 x 2 mm (series 3, image 96), which may represent the distal portion of an occluded right A1 versus an infundibulum or small aneurysm. Possible variant anatomy at the anterior communicating artery, with 3 vessels ascending; anterior cerebral arteries are patent to their distal aspects. Mild narrowing in the right M1 (series 3, image 99). Normal left M1. Normal MCA bifurcations. Distal MCA  branches perfused and symmetric. Vertebral arteries patent to the vertebrobasilar junction without stenosis. Basilar patent to its distal aspect. Superior cerebellar arteries patent proximally bilaterally. Possible duplication of the left PCA, with a patent left posterior communicating artery as well as a patent left PCA. The right PCA is diminutive, poorly perfused and mildly irregular (series 3, images 102, 90, 87, for example). The right posterior communicating artery is not definitively visualized. IMPRESSION: 1. Diminutive right PCA, which is poorly perfused and somewhat irregular. 2. Aplastic versus occluded right A1, with a 1 x 2 mm posterolateral projection from the right aspect of the anterior communicating artery, which may reflect the distal portion of an occluded right A1 versus a small infundibulum or aneurysm. Attention on follow-up. 3. No hemodynamically significant stenosis in the neck. Electronically Signed   By: Merilyn Baba M.D.   On: 04/07/2021 22:01       LOS: 1 day   [facility] Pager on [email or web address]  04/08/2021, 11:24 AM

## 2021-04-08 NOTE — Evaluation (Signed)
Physical Therapy Evaluation & Discharge Patient Details Name: Collin Bridges MRN: [id number] DOB: 11-21-1945 Today's Date: 04/08/2021  History of Present Illness  Pt is a 75 y.o. male who presented 04/07/21 as a referral from his PCP due to abnormal findings on his brain MRI. Per chart, pt fell in his bathroom ~1 month prior, in which a Thompson did not reveal an intracranial hemorrhage. However, MRI revealed a 9mm mass along the left tentorium that was compatible w/ meningioma without mass-effect or edema and subcentimeter subacute bilateral cerebral infarcts. PMH: HTN, anxiety, CKD3a, hypoglycemia   Clinical Impression  Pt presents with condition above. PTA, he was independent, relying on friends for transport and shopping and living alone in a 1-level house with several stairs to enter/exit with handrails. Pt denies any recent falls other than the one ~1 month prior in which he slipped on soap and another fall following having AMS s/p COVID vaccination. Currently, pt is able to perform all functional mobility safely without UE support, LOB, or assistance. Pt does display deficits in balance when his vestibular system is challenged when ambulating, resulting in his DGI score of 21 this date, but he appropriately responds to maintain his safety. Pt appears to be functioning at his baseline, but does display some deficits in L upper and lower extremity coordination, memory, attention, and speech. Communicated concerns with OT and SLP. No further PT needs identified. All education completed and questions answered. PT will sign off.     Recommendations for follow up therapy are one component of a multi-disciplinary discharge planning process, led by the attending physician.  Recommendations may be updated based on patient status, additional functional criteria and insurance authorization.  Follow Up Recommendations No PT follow up    Assistance Recommended at Discharge Intermittent  Supervision/Assistance  Functional Status Assessment Patient has not had a recent decline in their functional status  Equipment Recommendations  None recommended by PT    Recommendations for Other Services OT consult;Speech consult     Precautions / Restrictions Precautions Precautions: Fall Restrictions Weight Bearing Restrictions: No      Mobility  Bed Mobility Overal bed mobility: Modified Independent             General bed mobility comments: Pt able to transition supine > sit without difficulty.    Transfers Overall transfer level: Independent Equipment used: None               General transfer comment: Pt able to transfer to stand without LOB, slightly wide BOS.    Ambulation/Gait Ambulation/Gait assistance: Supervision Gait Distance (Feet): 290 Feet Assistive device: None Gait Pattern/deviations: Step-through pattern;Wide base of support Gait velocity: reduced Gait velocity interpretation: >2.62 ft/sec, indicative of community ambulatory   General Gait Details: Pt with step-through gait pattern, no LOB, slightly wide BOS. Pt able to change speeds, directions, and navigate obstacles without LOB. Pt slowing speed with slight unsteadiness noted when turning head L <> R but no LOB, supervision for safety.  Stairs Stairs: Yes Stairs assistance: Min guard Stair Management: Two rails;No rails;Alternating pattern;Forwards Number of Stairs: 6 General stair comments: Initially using bil rails but progressed to no rails for final 4 stairs, reciprocal pattern, no LOB.  Wheelchair Mobility    Modified Rankin (Stroke Patients Only) Modified Rankin (Stroke Patients Only) Pre-Morbid Rankin Score: Slight disability Modified Rankin: Slight disability     Balance Overall balance assessment: Mild deficits observed, not formally tested  Standardized Balance Assessment Standardized Balance Assessment : Dynamic Gait  Index   Dynamic Gait Index Level Surface: Normal Change in Gait Speed: Normal Gait with Horizontal Head Turns: Moderate Impairment Gait with Vertical Head Turns: Mild Impairment Gait and Pivot Turn: Normal Step Over Obstacle: Normal Step Around Obstacles: Normal Steps: Normal Total Score: 21       Pertinent Vitals/Pain Pain Assessment: Faces Faces Pain Scale: No hurt Pain Intervention(s): Monitored during session    Home Living Family/patient expects to be discharged to:: Private residence Living Arrangements: Alone Available Help at Discharge: Friend(s);Available PRN/intermittently Type of Home: House Home Access: Stairs to enter Entrance Stairs-Rails: Can reach both Entrance Stairs-Number of Steps: 3   Home Layout: One level Home Equipment: None      Prior Function Prior Level of Function : Independent/Modified Independent;History of Falls (last six months)             Mobility Comments: Pt reports only falls in past 6 months were due to having AMS following COVID vaccine and this fall ~1 month prior in which he slipped on soap in the shower. Otherwise, no falls and functions without AD/AE. ADLs Comments: Does not drive, relies on friends or services to deliver or pick up groceries.     Hand Dominance        Extremity/Trunk Assessment   Upper Extremity Assessment Upper Extremity Assessment: LUE deficits/detail LUE Deficits / Details: Incoordination noted with pt slowing speed with finger to nose on L compared to R and with difficulty synchronizing bil alternating palm up <> down on lap; MMT scores of 4+ to 5 grossly bil; denied numbness/tingling bil LUE Sensation: WNL LUE Coordination: decreased gross motor    Lower Extremity Assessment Lower Extremity Assessment: LLE deficits/detail LLE Deficits / Details: Incoordination noted with slowed heel to shin rubbing on L compared to R and dysdiadochokinesia noted with bil simultaneous feet tapping; MMT scores of  4+ to 5 grossly bil; denied numbness/tingling bil LLE Sensation: WNL LLE Coordination: decreased gross motor    Cervical / Trunk Assessment Cervical / Trunk Assessment: Normal  Communication   Communication: Expressive difficulties (reports new since fall ~1 month prior)  Cognition Arousal/Alertness: Awake/alert Behavior During Therapy: WFL for tasks assessed/performed Overall Cognitive Status: Impaired/Different from baseline Area of Impairment: Memory;Attention;Safety/judgement                   Current Attention Level: Alternating Memory: Decreased short-term memory   Safety/Judgement: Decreased awareness of deficits     General Comments: Pt reporting STM deficits since his fall ~1 month prior. Pt difficult to keep on task at times with tangential speech. Pt with some decreased insight into his deficits, trying to explain other reasons for his L sided incoordination, but sometimes going off topic with inability to follow what pt was trying to describe.        General Comments General comments (skin integrity, edema, etc.): Pt reports STM deficits and speech difficulty began after fall ~1 month prior, SLP and OT notified    Exercises     Assessment/Plan    PT Assessment Patient does not need any further PT services  PT Problem List         PT Treatment Interventions      PT Goals (Current goals can be found in the Care Plan section)  Acute Rehab PT Goals Patient Stated Goal: to go home PT Goal Formulation: All assessment and education complete, DC therapy Time For Goal Achievement: 04/09/21 Potential to Achieve  Goals: Good    Frequency     Barriers to discharge        Co-evaluation               AM-PAC PT "6 Clicks" Mobility  Outcome Measure Help needed turning from your back to your side while in a flat bed without using bedrails?: None Help needed moving from lying on your back to sitting on the side of a flat bed without using bedrails?:  None Help needed moving to and from a bed to a chair (including a wheelchair)?: None Help needed standing up from a chair using your arms (e.g., wheelchair or bedside chair)?: None Help needed to walk in hospital room?: A Little Help needed climbing 3-5 steps with a railing? : A Little 6 Click Score: 22    End of Session Equipment Utilized During Treatment: Gait belt Activity Tolerance: Patient tolerated treatment well Patient left: in chair;with call bell/phone within reach;with chair alarm set Nurse Communication: Mobility status PT Visit Diagnosis: Unsteadiness on feet (R26.81);Other symptoms and signs involving the nervous system (R29.898)    Time: [id number] PT Time Calculation (min) (ACUTE ONLY): 31 min   Charges:   PT Evaluation $PT Eval Low Complexity: 1 Low PT Treatments $Gait Training: 8-22 mins        Moishe Spice, PT, DPT Acute Rehabilitation Services  Pager: [phone] Office: [phone]   Orvan Falconer 04/08/2021, 10:54 AM

## 2021-04-08 NOTE — Evaluation (Signed)
Occupational Therapy Evaluation Patient Details Name: Collin Bridges MRN: [id number] DOB: 04-03-1946 Today's Date: 04/08/2021   History of Present Illness Pt is a 75 y.o. male who presented 04/07/21 as a referral from his PCP due to abnormal findings on his brain MRI. Per chart, pt fell in his bathroom ~1 month prior, in which a Victoria did not reveal an intracranial hemorrhage. However, MRI revealed a 59mm mass along the left tentorium that was compatible w/ meningioma without mass-effect or edema and subcentimeter subacute bilateral cerebral infarcts. PMH: HTN, anxiety, CKD3a, hypoglycemia   Clinical Impression   Pt admitted for concerns listed above. PTA pt reported that he was independent with all ADL's and IADL's. He lives alone and has friends provide him with transportation. At this time, pt presents with some decreased coordination on the L side, however he is able to complete all ADL's independently and ambulate with no difficulties. Pt educated to have family or friends check in on him daily and to go see an eye doctor due to some mild L visual deficits. Pt has no further acute OT needs and acute OT will sign off.      Recommendations for follow up therapy are one component of a multi-disciplinary discharge planning process, led by the attending physician.  Recommendations may be updated based on patient status, additional functional criteria and insurance authorization.   Follow Up Recommendations  No OT follow up    Assistance Recommended at Discharge PRN  Functional Status Assessment  Patient has had a recent decline in their functional status and demonstrates the ability to make significant improvements in function in a reasonable and predictable amount of time.  Equipment Recommendations  None recommended by OT    Recommendations for Other Services       Precautions / Restrictions Precautions Precautions: Fall Restrictions Weight Bearing Restrictions: No       Mobility Bed Mobility Overal bed mobility: Modified Independent             General bed mobility comments: Pt able to transition supine > sit without difficulty.    Transfers Overall transfer level: Independent Equipment used: None               General transfer comment: Pt able to transfer to stand without LOB, slightly wide BOS.      Balance Overall balance assessment: Mild deficits observed, not formally tested                                         ADL either performed or assessed with clinical judgement   ADL Overall ADL's : Modified independent                                       General ADL Comments: Pt able to complete all basic ADL's independently     Vision Baseline Vision/History: 1 Wears glasses Ability to See in Adequate Light: 0 Adequate Patient Visual Report: Blurring of vision Vision Assessment?: Vision impaired- to be further tested in functional context Additional Comments: Pt educated to follow up with an eye doctor     Perception     Praxis      Pertinent Vitals/Pain Pain Assessment: No/denies pain Faces Pain Scale: No hurt     Hand Dominance Right   Extremity/Trunk Assessment Upper Extremity  Assessment Upper Extremity Assessment: LUE deficits/detail LUE Deficits / Details: Incoordination noted with pt slowing speed with finger to nose on L compared to R and with difficulty synchronizing bil alternating palm up <> down on lap; MMT scores of 4+ to 5 grossly bil; denied numbness/tingling bil LUE Sensation: WNL LUE Coordination: decreased gross motor   Lower Extremity Assessment Lower Extremity Assessment: Defer to PT evaluation   Cervical / Trunk Assessment Cervical / Trunk Assessment: Normal   Communication Communication Communication: Expressive difficulties (reports new since fall ~1 month prior)   Cognition Arousal/Alertness: Awake/alert Behavior During Therapy: WFL for tasks  assessed/performed Overall Cognitive Status: Impaired/Different from baseline Area of Impairment: Memory;Attention;Safety/judgement                   Current Attention Level: Alternating Memory: Decreased short-term memory   Safety/Judgement: Decreased awareness of deficits     General Comments: Pt reporting STM deficits since his fall ~1 month prior. Pt difficult to keep on task at times with tangential speech. Pt with some decreased insight into his deficits, sometimes going off topic.     General Comments  Pt having some word finding difficulties, reporting STM deficits.    Exercises     Shoulder Instructions      Home Living Family/patient expects to be discharged to:: Private residence Living Arrangements: Alone Available Help at Discharge: Friend(s);Available PRN/intermittently Type of Home: House Home Access: Stairs to enter [employer] of Steps: 3 Entrance Stairs-Rails: Can reach both Home Layout: One level     Bathroom Shower/Tub: Teacher, early years/pre: Standard     Home Equipment: None          Prior Functioning/Environment Prior Level of Function : Independent/Modified Independent;History of Falls (last six months)             Mobility Comments: Pt reports only falls in past 6 months were due to having AMS following COVID vaccine and this fall ~1 month prior in which he slipped on soap in the shower. Otherwise, no falls and functions without AD/AE. ADLs Comments: Does not drive, relies on friends or services to deliver or pick up groceries.        OT Problem List: Decreased strength;Decreased activity tolerance;Impaired balance (sitting and/or standing);Decreased coordination;Decreased safety awareness      OT Treatment/Interventions:      OT Goals(Current goals can be found in the care plan section) Acute Rehab OT Goals Patient Stated Goal: To go home OT Goal Formulation: All assessment and education complete, DC  therapy Time For Goal Achievement: 04/08/21 Potential to Achieve Goals: Good  OT Frequency:     Barriers to D/C:            Co-evaluation              AM-PAC OT "6 Clicks" Daily Activity     Outcome Measure Help from another person eating meals?: None Help from another person taking care of personal grooming?: None Help from another person toileting, which includes using toliet, bedpan, or urinal?: None Help from another person bathing (including washing, rinsing, drying)?: None Help from another person to put on and taking off regular upper body clothing?: None Help from another person to put on and taking off regular lower body clothing?: None 6 Click Score: 24   End of Session Nurse Communication: Mobility status  Activity Tolerance: Patient tolerated treatment well Patient left: in bed;with call bell/phone within reach  OT Visit Diagnosis: Unsteadiness on feet (R26.81);Muscle  weakness (generalized) (M62.81)                Time: 1534-1550 OT Time Calculation (min): 16 min Charges:  OT General Charges $OT Visit: 1 Visit OT Evaluation $OT Eval Low Complexity: [place]., OTR/L Acute Rehabilitation  Kandie Keiper Elane Yolanda Bonine 04/08/2021, 5:22 PM

## 2021-04-08 NOTE — Care Management (Addendum)
Called room phone and cell phone to give obs notice/ code 44, no answer left confidential voice message to call this RNCM back. Messaged  RN   that CM needs to speak to patient prior to DC 1200 patient is currently receiving ECHO per RN.  1300 Recalled room, no answer 1340 called room no answer messaged nurse for additional assistance  1430 called room and cell n answer. Called 3W floor for assistance. They are replacing the phone.in the room

## 2021-04-08 NOTE — Evaluation (Signed)
Speech Language Pathology Evaluation Patient Details Name: Collin Bridges MRN: [id number] DOB: 1945/12/15 Today's Date: 04/08/2021 Time: 5003-7048 SLP Time Calculation (min) (ACUTE ONLY): 24 min  Problem List:  Patient Active Problem List   Diagnosis Date Noted   CVA (cerebral vascular accident) ([place]) 04/07/2021   Brain mass 03/17/2021   Headache 03/17/2021   Cerebral atrophy ([place]) 03/17/2021   Stroke of right basal ganglia ([place]) 03/17/2021   Forehead pain 03/17/2021   Increased prostate specific antigen (PSA) velocity 12/14/2020   CKD (chronic kidney disease) stage 3, GFR 30-59 ml/min ([place]) 12/14/2020   HLD (hyperlipidemia) 08/09/2017   Diabetes ([place]) 08/09/2017   Encounter for well adult exam with abnormal findings 07/29/2016   Aortic stenosis, mild 07/29/2016   Insomnia 07/29/2016   Erectile dysfunction 03/30/2016   Anxiety disorder 01/06/2016   Benign prostatic hyperplasia with urinary frequency 01/06/2016   Essential hypertension 02/02/2012   Hypoglycemia 02-15-1946   Past Medical History:  Past Medical History:  Diagnosis Date   Allergy    seasonal   Anxiety    Depression    Headache    Hepatitis C    HLD (hyperlipidemia) 08/09/2017   Hypertension    Hypoglycemia    Past Surgical History:  Past Surgical History:  Procedure Laterality Date   APPENDECTOMY     TONSILECTOMY, ADENOIDECTOMY, BILATERAL MYRINGOTOMY AND TUBES     HPI:  Pt is a 75 y.o. male who presented 04/07/21 as a referral from his PCP due to abnormal findings on his brain MRI. On admission pt reported a fall on 03/10/21 which required sutures to the right frontal scalp. At that time CT head revealed a small meningioma below the anterior aspect of the tentorium on the left and was discharged with recommendation for MRI follow-up. MRI brain 11/19: revealed a 43mm mass along the left tentorium that was compatible with meningioma without mass-effect or edema and subcentimeter subacute bilateral  cerebral infarcts. PMH: HTN, anxiety, CKD3a, hypoglycemia.   Assessment / Plan / Recommendation Clinical Impression  Pt participated in speech/language/cognition evaluation. Pt reported that he has been having difficulty with memory since the fall in October and that he believes this has worsened. Pt denied any acute changes in speech, but stated that he intermittently has word retrieval difficulty. Pt's speech and language skills were [facility] with intermittent need for additional processing time for verbal expression. The [facility] Mental Status Examination was completed to evaluate the pt's cognitive-linguistic skills. He achieved a score of 24/30 which is below the normal limits of 27 or more out of 30 and is suggestive of a mild impairment. He exhibited difficulty in the areas of attention, memory, and executive function. Skilled SLP services are clinically indicated at this time to improve cognitive-linguistic function.    SLP Assessment  SLP Recommendation/Assessment: Patient needs continued Speech [facility] Services SLP Visit Diagnosis: Cognitive communication deficit (R41.841)    Recommendations for follow up therapy are one component of a multi-disciplinary discharge planning process, led by the attending physician.  Recommendations may be updated based on patient status, additional functional criteria and insurance authorization.    Follow Up Recommendations   (Continued SLP services at level of care recommended by OT)    Assistance Recommended at Discharge     Functional Status Assessment Patient has had a recent decline in their functional status and demonstrates the ability to make significant improvements in function in a reasonable and predictable amount of time.  Frequency and Duration min 2x/week  2  weeks      SLP Evaluation Cognition  Overall Cognitive Status: Impaired/Different from baseline Arousal/Alertness: Awake/alert Orientation Level: Oriented  X4 Year: 2022 Month: November Day of Week: Correct Attention: Focused;Sustained Focused Attention: Impaired Focused Attention Impairment: Verbal complex Sustained Attention: Impaired Sustained Attention Impairment: Verbal complex Memory: Impaired Memory Impairment: Retrieval deficit;Decreased recall of new information;Decreased short term memory (Immediate: 3/5; 5/5 with cues; delayed: 4/5 with cues: 1/1 Paragraph: 4/8) Awareness: Appears intact Problem Solving: Appears intact Executive Function: Sequencing;Organizing Sequencing: Appears intact (Clock drawing: 4/4) Organizing: Impaired Organizing Impairment: Verbal complex (backward digit span: 1/2)       Comprehension  Auditory Comprehension Overall Auditory Comprehension: Appears within functional limits for tasks assessed Yes/No Questions: Within Functional Limits Commands: Within Functional Limits Conversation: Complex    Expression Expression Primary Mode of Expression: Verbal Verbal Expression Overall Verbal Expression: Appears within functional limits for tasks assessed Initiation: No impairment Level of Generative/Spontaneous Verbalization: Conversation   Oral / Motor  Oral Motor/Sensory Function Overall Oral Motor/Sensory Function: Within functional limits Motor Speech Overall Motor Speech: Appears within functional limits for tasks assessed Respiration: Within functional limits Phonation: Normal Resonance: Within functional limits Articulation: Within functional limitis Intelligibility: Intelligible Motor Planning: Witnin functional limits Motor Speech Errors: Not applicable   Parry Po I. Hardin Negus, [place], [place] Office number [phone] Pager [place] 04/08/2021, 1:01 PM

## 2021-04-08 NOTE — Plan of Care (Signed)
  Problem: Health Behavior/Discharge Planning: Goal: Ability to manage health-related needs will improve Outcome: Adequate for Discharge   Problem: Clinical Measurements: Goal: Ability to maintain clinical measurements within normal limits will improve Outcome: Adequate for Discharge Goal: Will remain free from infection Outcome: Adequate for Discharge Goal: Diagnostic test results will improve Outcome: Adequate for Discharge   Problem: Activity: Goal: Risk for activity intolerance will decrease Outcome: Adequate for Discharge   Problem: Coping: Goal: Level of anxiety will decrease Outcome: Adequate for Discharge   Problem: Pain Managment: Goal: General experience of comfort will improve Outcome: Adequate for Discharge   Problem: Safety: Goal: Ability to remain free from injury will improve Outcome: Adequate for Discharge   Problem: Skin Integrity: Goal: Risk for impaired skin integrity will decrease Outcome: Adequate for Discharge   Problem: Education: Goal: Knowledge of disease or condition will improve Outcome: Adequate for Discharge Goal: Knowledge of secondary prevention will improve (SELECT ALL) Outcome: Adequate for Discharge Goal: Knowledge of patient specific risk factors will improve (INDIVIDUALIZE FOR PATIENT) Outcome: Adequate for Discharge Goal: Individualized Educational Video(s) Outcome: Adequate for Discharge   Problem: Coping: Goal: Will verbalize positive feelings about self Outcome: Adequate for Discharge   Problem: Health Behavior/Discharge Planning: Goal: Ability to manage health-related needs will improve Outcome: Adequate for Discharge   Problem: Self-Care: Goal: Ability to participate in self-care as condition permits will improve Outcome: Adequate for Discharge   Problem: Ischemic Stroke/TIA Tissue Perfusion: Goal: Complications of ischemic stroke/TIA will be minimized Outcome: Adequate for Discharge

## 2021-04-08 NOTE — Progress Notes (Signed)
  Echocardiogram 2D Echocardiogram has been performed.  Johny Chess 04/08/2021, 1:11 PM

## 2021-04-08 NOTE — Progress Notes (Signed)
STROKE TEAM PROGRESS NOTE   INTERVAL HISTORY No acute events  since admission. VSS. Afebrile. Today patient is denying any new or continuing neurologic symptoms. We discussed his recent fall in the bathtub on 10/26 where he struck his head prompting head CT and then MRI for suspected meningioma. He also mentioned a fall back in May with head strike which also occurred in the bathtub. He reports he takes 325mg  of aspirin daily which is self directed to keep away strokes and heart attacks. He lives alone. Has no family left.  We discussed silent incidentally found tiny lacunar strokes, his ongoing work up and plan of care including medications, lifestyle changes and follow up. He was advised to consider altering home bathroom/tub environment to improve his safety in light of two falls. His questions were answered.   Vitals:   04/07/21 2127 04/07/21 2353 04/08/21 0358 04/08/21 0735  BP: (!) 156/99 140/73 (!) 187/98 (!) 186/113  Pulse: 87 91 91 86  Resp: 18 17 17 16   Temp: 98.2 F (36.8 C) (!) 97.5 F (36.4 C) (!) 97.5 F (36.4 C) 97.9 F (36.6 C)  TempSrc: Oral Oral Oral Oral  SpO2: 96% 95% 96% 98%  Weight:      Height:       CBC:  Recent Labs  Lab 04/07/21 1143  WBC 6.6  NEUTROABS 4.6  HGB 13.9  HCT 40.3  MCV 95.5  PLT 818*   Basic Metabolic Panel:  Recent Labs  Lab 04/07/21 1143  NA 138  K 4.5  CL 103  CO2 28  GLUCOSE 227*  BUN 13  CREATININE 1.37*  CALCIUM 9.1   Lipid Panel:  Recent Labs  Lab 04/08/21 0102  CHOL 246*  TRIG 477*  HDL 34*  CHOLHDL 7.2  VLDL UNABLE TO CALCULATE IF TRIGLYCERIDE OVER 400 mg/dL  LDLCALC UNABLE TO CALCULATE IF TRIGLYCERIDE OVER 400 mg/dL   HgbA1c:  Recent Labs  Lab 04/08/21 0102  HGBA1C 5.8*   Urine Drug Screen:  Recent Labs  Lab 04/07/21 1204  LABOPIA NONE DETECTED  COCAINSCRNUR NONE DETECTED  LABBENZ NONE DETECTED  AMPHETMU NONE DETECTED  THCU POSITIVE*  LABBARB NONE DETECTED    Alcohol Level No results for  input(s): ETH in the last 168 hours.  IMAGING past 24 hours MR ANGIO HEAD WO CONTRAST  Result Date: 04/07/2021 CLINICAL DATA:  Stroke follow-up EXAM: MRA NECK WITHOUT CONTRAST MRA HEAD WITHOUT CONTRAST TECHNIQUE: Angiographic images of the Circle of Willis were acquired using MRA technique without intravenous contrast. COMPARISON:  None. FINDINGS: MRA NECK FINDINGS Normal aortic branching.  No evidence of dissection or aneurysm. There is likely calcification at the bifurcations of the bilateral carotid arteries, which does not appear hemodynamically significant. No occlusion or dissection. Evaluation of the origins of the bilateral vertebral arteries is limited. The vertebral arteries are otherwise patent without stenosis, occlusion, or dissection. MRA HEAD FINDINGS Both internal carotid arteries are patent to the termini, without stenosis or other abnormality. Normal left A1. Aplastic versus occluded right A1. At the right aspect of the anterior communicating artery, there is a right posterolateral directed outpouching, measuring 1 x 2 mm (series 3, image 96), which may represent the distal portion of an occluded right A1 versus an infundibulum or small aneurysm. Possible variant anatomy at the anterior communicating artery, with 3 vessels ascending; anterior cerebral arteries are patent to their distal aspects. Mild narrowing in the right M1 (series 3, image 99). Normal left M1. Normal MCA bifurcations. Distal MCA branches  perfused and symmetric. Vertebral arteries patent to the vertebrobasilar junction without stenosis. Basilar patent to its distal aspect. Superior cerebellar arteries patent proximally bilaterally. Possible duplication of the left PCA, with a patent left posterior communicating artery as well as a patent left PCA. The right PCA is diminutive, poorly perfused and mildly irregular (series 3, images 102, 90, 87, for example). The right posterior communicating artery is not definitively  visualized. IMPRESSION: 1. Diminutive right PCA, which is poorly perfused and somewhat irregular. 2. Aplastic versus occluded right A1, with a 1 x 2 mm posterolateral projection from the right aspect of the anterior communicating artery, which may reflect the distal portion of an occluded right A1 versus a small infundibulum or aneurysm. Attention on follow-up. 3. No hemodynamically significant stenosis in the neck. Electronically Signed   By: Merilyn Baba M.D.   On: 04/07/2021 22:01   MR ANGIO NECK WO CONTRAST  Result Date: 04/07/2021 CLINICAL DATA:  Stroke follow-up EXAM: MRA NECK WITHOUT CONTRAST MRA HEAD WITHOUT CONTRAST TECHNIQUE: Angiographic images of the Circle of Willis were acquired using MRA technique without intravenous contrast. COMPARISON:  None. FINDINGS: MRA NECK FINDINGS Normal aortic branching.  No evidence of dissection or aneurysm. There is likely calcification at the bifurcations of the bilateral carotid arteries, which does not appear hemodynamically significant. No occlusion or dissection. Evaluation of the origins of the bilateral vertebral arteries is limited. The vertebral arteries are otherwise patent without stenosis, occlusion, or dissection. MRA HEAD FINDINGS Both internal carotid arteries are patent to the termini, without stenosis or other abnormality. Normal left A1. Aplastic versus occluded right A1. At the right aspect of the anterior communicating artery, there is a right posterolateral directed outpouching, measuring 1 x 2 mm (series 3, image 96), which may represent the distal portion of an occluded right A1 versus an infundibulum or small aneurysm. Possible variant anatomy at the anterior communicating artery, with 3 vessels ascending; anterior cerebral arteries are patent to their distal aspects. Mild narrowing in the right M1 (series 3, image 99). Normal left M1. Normal MCA bifurcations. Distal MCA branches perfused and symmetric. Vertebral arteries patent to the  vertebrobasilar junction without stenosis. Basilar patent to its distal aspect. Superior cerebellar arteries patent proximally bilaterally. Possible duplication of the left PCA, with a patent left posterior communicating artery as well as a patent left PCA. The right PCA is diminutive, poorly perfused and mildly irregular (series 3, images 102, 90, 87, for example). The right posterior communicating artery is not definitively visualized. IMPRESSION: 1. Diminutive right PCA, which is poorly perfused and somewhat irregular. 2. Aplastic versus occluded right A1, with a 1 x 2 mm posterolateral projection from the right aspect of the anterior communicating artery, which may reflect the distal portion of an occluded right A1 versus a small infundibulum or aneurysm. Attention on follow-up. 3. No hemodynamically significant stenosis in the neck. Electronically Signed   By: Merilyn Baba M.D.   On: 04/07/2021 22:01    PHYSICAL EXAM Pleasant elderly caucasian male not in distress. . Afebrile. Head is nontraumatic. Neck is supple without bruit.    Cardiac exam no murmur or gallop. Lungs are clear to auscultation. Distal pulses are well felt.  Neurological Exam ;  Awake  Alert oriented x 3. Normal speech and language.eye movements full without nystagmus.fundi were not visualized. Vision acuity and fields appear normal. Hearing is normal. Palatal movements are normal. Face symmetric. Tongue midline. Normal strength, tone, reflexes and coordination. Normal sensation. Gait deferred.  ASSESSMENT/PLAN 75 year old  male who presented to the ED as advised by his primary care provider after MRI outpatient revealed suspected subcentimeter subacute bilateral cerebral infarcts and a 12 mm mass along the left tentorium compatible with meningioma without mass-effect or edema.  Stroke: Tiny likely silent subacute bilateral cerebral infarcts due to SVD    MRI  04/03/21 1. Suspected subcentimeter subacute bilateral cerebral  infarcts. 2. 12 mm mass along the left tentorium compatible with a meningioma. No mass effect or edema. 3. Severe chronic small vessel ischemic disease.   MRA  04/07/2021 Diminutive right PCA, which is poorly perfused and somewhat irregular. Aplastic versus occluded right A1, with a 1 x 2 mm posterolateralprojection from the right aspect of the anterior communicating artery, which may reflect the distal portion of an occluded right A1 versus a small infundibulum or aneurysm. Attention on follow-up.  CT head 03/10/21 Findings likely consistent with a small meningioma below the anterior aspect of the tentorium on the left. MRI correlation is recommended. Small right frontal scalp soft tissue defect. No acute intracranial abnormality No hemodynamically significant stenosis in the neck.  2D Echo PENDING LDL direct is 110.  HgbA1c 5.8 VTE prophylaxis - Discharging today     Diet   Diet Heart Room service appropriate? Yes; Fluid consistency: Thin  Taking self directed ASA 325mg  dose prior to admission.  Recommend DAPT with Plavix 75mg  and ASA 81mg  x 3 weeks then ASA 81mg  for life.  Therapy recommendations:  Cleared Disposition:  home  Follow up with Dr. Leonie Man in 6-8 weeks.   Hypertension Stable Permissive hypertension (OK if < 220/120) but gradually normalize in 5-7 days Long-term BP goal normotensive Management per primary team   Hyperlipidemia Home meds:  None  LDL direct is 110, goal < 70 High intensity statin:  Lipitor 40mg   Continue statin at discharge  Other Stroke Risk Factors ?aneurysm and likely meningioma as above, need to address at outpatient follow up Advanced Age >/= 57  Former Cigarette smoker Substance abuse - UDS:  THC POSITIVE, Cocaine NONE DETECTED. Patient advised to stop using due to stroke risk. Overweight,  Body mass index is 25.9 kg/m., BMI >/= 30 associated with increased stroke risk, recommend weight loss, diet and exercise as appropriate  At risk  for Obstructive sleep apnea  Other Active Problems   Hospital day # 1  This patient was seen and evaluated with Dr. Leonie Man. He directed the plan of care.  Hetty Blend, NP-C, MSN  STROKE MD NOTE :  I have personally obtained history,examined this patient, reviewed notes, independently viewed imaging studies, participated in medical decision making and plan of care.ROS completed by me personally and pertinent positives fully documented  I have made any additions or clarifications directly to the above note. Agree with note above.  Patient presented with transient Cheiro oral paresthesias likely due to TIA from small vessel disease.  Recommend continue ongoing stroke work-up and check echocardiogram.  Aspirin and Plavix for 3 weeks then aspirin alone and aggressive risk factor modification.  Patient counseled to quit using marijuana.  Elective follow-up for his meningioma as an outpatient with neurosurgery greater than 50% time during this 35-minute visit was spent in counseling and coordination of care and discussion with care team and answering questions.  Discussed with Dr. [place] Pink.  Antony Contras, MD Medical Director [place] Pager: [phone] 04/08/2021 1:08 PM  To contact Stroke Continuity provider, please refer to [email or web address] After hours, contact General Neurology

## 2021-04-08 NOTE — Discharge Instructions (Signed)
Medicare Outpatient Observation Notice   Patient name:  Marke Goodwyn Patient number:  [id number]                                                                                                                                                                       You're a hospital outpatient receiving observation services. You are not an inpatient because:    TIA   Patient status is changed from inpatient to observation (outpatient)status as indicated under the Medicare Condition Code-44 Regulations, Chapter 100-04 of the Medicare Claims Processing Manual 50.3. Date of Status Change                                                                                                                                                                         Being an outpatient may affect what you pay in a hospital:   When you're a hospital outpatient, your observation stay is covered under Medicare Part B.   For Part B services, you generally pay:   A copayment for each outpatient hospital service you get. Part B copayments may vary by type of service.   20% of the Medicare-approved amount for most doctor services, after the Part B deductible.   Observation services may affect coverage and payment of your care after you leave the hospital:     If you need skilled nursing facility (SNF) care after you leave the hospital, Medicare Part A will only cover SNF care if you've had a 3-day minimum, medically necessary, inpatient hospital stay for a related illness or injury. An inpatient hospital stay begins the day the hospital admits you as an inpatient based on a doctor's order and doesn't include the day you're discharged.   If you have Medicaid, a Medicare Advantage plan or other health plan, Medicaid or the plan may have different rules for SNF coverage after you leave the hospital. Check with Medicaid or your plan.   NOTE: Medicare Part A generally doesn't  cover outpatient hospital  services, like an observation stay. However, Part A will generally cover medically necessary inpatient services if the hospital admits you as an inpatient based on a doctor's order. In most cases, you'll pay a one-time deductible for all of your inpatient hospital services for the first 60 days you're in a hospital.                                                                                                                                                                      If you have any questions about your observation services, ask the hospital staff member giving you this notice or the doctor providing your hospital care. You can also ask to speak with someone from the hospital's utilization or discharge planning department.   You can also call 1-800-MEDICARE (1-2298102352).  [employer] users should call [phone].   Form CMS [id number]   Expiration 05/15/2021 [employer] APPROVAL [id number]          Your costs for medications:     Generally, prescription and over-the-counter drugs, including "self-administered drugs," you get in a hospital outpatient setting (like an emergency department) aren't covered by Part B. "Self- administered drugs" are drugs you'd normally take on your own. For safety reasons, many hospitals don't allow you to take medications brought from home. If you have a Medicare prescription drug plan (Part D), your plan may help you pay for these drugs. You'll likely need to pay out-of- pocket for these drugs and submit a claim to your drug plan for a refund. Contact your drug plan for more information.                                                                                                                                                                        If you're enrolled in a Medicare Advantage plan (like an HMO or PPO) or other Medicare health plan (Part C), your costs and coverage may be different. Check with your plan to  find out about coverage for outpatient  observation services.    If you're a Qualified Medicare Beneficiary through your state Medicaid program, you can't be billed for Part A or Part B deductibles, coinsurance, and copayments.                                                                                                                                                                      Additional Information (Optional):                                                                                                                                                                                Please sign below to show you received and understand this notice.                                         Date: 04/08/21 / Time:3:02 PM   CMS does not discriminate in its programs and activities. To request this publication in alternative format, please call: 1-800-MEDICARE or email:[email or web address].   Form CMS [id number]   Expiration 05/15/2021 [employer] APPROVAL [id number]

## 2021-04-08 NOTE — Discharge Summary (Signed)
[facility]  Physician Discharge Summary   Patient ID: Collin Bridges MRN: [id number] DOB/AGE: 10/22/1945 75 y.o.  Admit date: 04/07/2021 Discharge date: 04/08/2021    PCP: Biagio Borg, MD  DISCHARGE DIAGNOSES:  Bilateral subacute cerebral infarcts Meningioma Essential hypertension Chronic kidney disease stage IIIa  RECOMMENDATIONS FOR OUTPATIENT FOLLOW UP: Outpatient follow-up with neurology.  Ambulatory referral sent to neurology. Message sent to cardiology to arrange outpatient TEE for abnormal appearing mitral valve    Home Health: None Equipment/Devices: None  CODE STATUS: Full code  DISCHARGE CONDITION: fair  Diet recommendation: Heart healthy  INITIAL HISTORY: 75 y.o. male with medical history significant of HTN, anxiety, CKD3a. The patient reports that he had a fall in his bathroom about a month ago. He was brought to the ED for evaluation. A CTH at the time did not reveal an intracranial hemorrhage; but there was concern for a meningioma. An MRI was recommended and was set up in the outpatient setting for 04/03/21. It showed a 52mm mass that was compatible w/ meningioma. There was not mass effect or edema. It also showed subcentimeter subacute bilateral cerebral infarcts. It was recommended that he come to the ED for evaluation. Denies any current neurological symptoms.   Consultations: Neurology  Procedures: Transthoracic echocardiogram   HOSPITAL COURSE:   Bilateral subacute cerebral infarcts Noted incidentally on MRI brain that was done to evaluate the meningioma that was seen on a recent CT scan.  Patient does not have any focal neurological deficits.  He was seen by neurology.  Underwent stroke work-up.  Underwent MRA head and neck. LDL is 110.  HbA1c 5.8. Patient is on atorvastatin. Discussed with neurology.  Recommendation is to do aspirin and Plavix for 3 weeks followed by Plavix alone.  Patient was taking aspirin previously. Seen  by physical therapy.  No needs identified. Transthoracic echocardiogram as below.  Concern raised for mobile soft mitral valvular echogenic mass thought to be calcified redundant MV cord.  A TEE is recommended.  Message sent to cardiology to arrange this in the outpatient setting.  Discussed with neurology regarding this finding and they do not think that this is going to change current management.  Patient is otherwise doing well.  Findings communicated to the patient.  Okay for discharge.   Meningioma Outpatient follow-up.  Essential hypertension Continue home medications.  Dose of ARB has been increased.   Chronic kidney disease stage IIIa Stable.   Patient is stable.  Okay for discharge home today.   PERTINENT LABS:  The results of significant diagnostics from this hospitalization (including imaging, microbiology, ancillary and laboratory) are listed below for reference.    Microbiology: Recent Results (from the past 240 hour(s))  Resp Panel by RT-PCR (Flu A&B, Covid) Nasopharyngeal Swab     Status: None   Collection Time: 04/07/21 11:43 AM   Specimen: Nasopharyngeal Swab; Nasopharyngeal(NP) swabs in vial transport medium  Result Value Ref Range Status   SARS Coronavirus 2 by RT PCR NEGATIVE NEGATIVE Final    Comment: (NOTE) SARS-CoV-2 target nucleic acids are NOT DETECTED.  The SARS-CoV-2 RNA is generally detectable in upper respiratory specimens during the acute phase of infection. The lowest concentration of SARS-CoV-2 viral copies this assay can detect is 138 copies/mL. A negative result does not preclude SARS-Cov-2 infection and should not be used as the sole basis for treatment or other patient management decisions. A negative result may occur with  improper specimen collection/handling, submission of specimen other than nasopharyngeal swab, presence of  viral mutation(s) within the areas targeted by this assay, and inadequate number of viral copies(<138 copies/mL). A  negative result must be combined with clinical observations, patient history, and epidemiological information. The expected result is Negative.  Fact Sheet for Patients:  EntrepreneurPulse.com.au  Fact Sheet for Healthcare Providers:  IncredibleEmployment.be  This test is no t yet approved or cleared by the [place] FDA and  has been authorized for detection and/or diagnosis of SARS-CoV-2 by FDA under an Emergency Use Authorization (EUA). This EUA will remain  in effect (meaning this test can be used) for the duration of the COVID-19 declaration under Section 564(b)(1) of the Act, 21 U.S.C.section 360bbb-3(b)(1), unless the authorization is terminated  or revoked sooner.       Influenza A by PCR NEGATIVE NEGATIVE Final   Influenza B by PCR NEGATIVE NEGATIVE Final    Comment: (NOTE) The Xpert Xpress SARS-CoV-2/FLU/RSV plus assay is intended as an aid in the diagnosis of influenza from Nasopharyngeal swab specimens and should not be used as a sole basis for treatment. Nasal washings and aspirates are unacceptable for Xpert Xpress SARS-CoV-2/FLU/RSV testing.  Fact Sheet for Patients: EntrepreneurPulse.com.au  Fact Sheet for Healthcare Providers: IncredibleEmployment.be  This test is not yet approved or cleared by the [place] FDA and has been authorized for detection and/or diagnosis of SARS-CoV-2 by FDA under an Emergency Use Authorization (EUA). This EUA will remain in effect (meaning this test can be used) for the duration of the COVID-19 declaration under Section 564(b)(1) of the Act, 21 U.S.C. section 360bbb-3(b)(1), unless the authorization is terminated or revoked.  Performed at [facility], [place] [address], [place], [place] [address]      Labs:  COVID-19 Labs   Lab Results  Component Value Date   [place] 04/07/2021      Basic Metabolic  Panel: Recent Labs  Lab 04/07/21 1143  NA 138  K 4.5  CL 103  CO2 28  GLUCOSE 227*  BUN 13  CREATININE 1.37*  CALCIUM 9.1   Liver Function Tests: Recent Labs  Lab 04/07/21 1143  AST 35  ALT 22  ALKPHOS 119  BILITOT 0.7  PROT 7.5  ALBUMIN 4.3    CBC: Recent Labs  Lab 04/07/21 1143  WBC 6.6  NEUTROABS 4.6  HGB 13.9  HCT 40.3  MCV 95.5  PLT 135*    CBG: Recent Labs  Lab 04/07/21 1949 04/08/21 0612  GLUCAP 167* 147*     IMAGING STUDIES MR ANGIO HEAD WO CONTRAST  Result Date: 04/07/2021 CLINICAL DATA:  Stroke follow-up EXAM: MRA NECK WITHOUT CONTRAST MRA HEAD WITHOUT CONTRAST TECHNIQUE: Angiographic images of the Circle of Willis were acquired using MRA technique without intravenous contrast. COMPARISON:  None. FINDINGS: MRA NECK FINDINGS Normal aortic branching.  No evidence of dissection or aneurysm. There is likely calcification at the bifurcations of the bilateral carotid arteries, which does not appear hemodynamically significant. No occlusion or dissection. Evaluation of the origins of the bilateral vertebral arteries is limited. The vertebral arteries are otherwise patent without stenosis, occlusion, or dissection. MRA HEAD FINDINGS Both internal carotid arteries are patent to the termini, without stenosis or other abnormality. Normal left A1. Aplastic versus occluded right A1. At the right aspect of the anterior communicating artery, there is a right posterolateral directed outpouching, measuring 1 x 2 mm (series 3, image 96), which may represent the distal portion of an occluded right A1 versus an infundibulum or small aneurysm. Possible variant anatomy at the anterior  communicating artery, with 3 vessels ascending; anterior cerebral arteries are patent to their distal aspects. Mild narrowing in the right M1 (series 3, image 99). Normal left M1. Normal MCA bifurcations. Distal MCA branches perfused and symmetric. Vertebral arteries patent to the vertebrobasilar  junction without stenosis. Basilar patent to its distal aspect. Superior cerebellar arteries patent proximally bilaterally. Possible duplication of the left PCA, with a patent left posterior communicating artery as well as a patent left PCA. The right PCA is diminutive, poorly perfused and mildly irregular (series 3, images 102, 90, 87, for example). The right posterior communicating artery is not definitively visualized. IMPRESSION: 1. Diminutive right PCA, which is poorly perfused and somewhat irregular. 2. Aplastic versus occluded right A1, with a 1 x 2 mm posterolateral projection from the right aspect of the anterior communicating artery, which may reflect the distal portion of an occluded right A1 versus a small infundibulum or aneurysm. Attention on follow-up. 3. No hemodynamically significant stenosis in the neck. Electronically Signed   By: Merilyn Baba M.D.   On: 04/07/2021 22:01   MR ANGIO NECK WO CONTRAST  Result Date: 04/07/2021 CLINICAL DATA:  Stroke follow-up EXAM: MRA NECK WITHOUT CONTRAST MRA HEAD WITHOUT CONTRAST TECHNIQUE: Angiographic images of the Circle of Willis were acquired using MRA technique without intravenous contrast. COMPARISON:  None. FINDINGS: MRA NECK FINDINGS Normal aortic branching.  No evidence of dissection or aneurysm. There is likely calcification at the bifurcations of the bilateral carotid arteries, which does not appear hemodynamically significant. No occlusion or dissection. Evaluation of the origins of the bilateral vertebral arteries is limited. The vertebral arteries are otherwise patent without stenosis, occlusion, or dissection. MRA HEAD FINDINGS Both internal carotid arteries are patent to the termini, without stenosis or other abnormality. Normal left A1. Aplastic versus occluded right A1. At the right aspect of the anterior communicating artery, there is a right posterolateral directed outpouching, measuring 1 x 2 mm (series 3, image 96), which may represent  the distal portion of an occluded right A1 versus an infundibulum or small aneurysm. Possible variant anatomy at the anterior communicating artery, with 3 vessels ascending; anterior cerebral arteries are patent to their distal aspects. Mild narrowing in the right M1 (series 3, image 99). Normal left M1. Normal MCA bifurcations. Distal MCA branches perfused and symmetric. Vertebral arteries patent to the vertebrobasilar junction without stenosis. Basilar patent to its distal aspect. Superior cerebellar arteries patent proximally bilaterally. Possible duplication of the left PCA, with a patent left posterior communicating artery as well as a patent left PCA. The right PCA is diminutive, poorly perfused and mildly irregular (series 3, images 102, 90, 87, for example). The right posterior communicating artery is not definitively visualized. IMPRESSION: 1. Diminutive right PCA, which is poorly perfused and somewhat irregular. 2. Aplastic versus occluded right A1, with a 1 x 2 mm posterolateral projection from the right aspect of the anterior communicating artery, which may reflect the distal portion of an occluded right A1 versus a small infundibulum or aneurysm. Attention on follow-up. 3. No hemodynamically significant stenosis in the neck. Electronically Signed   By: Merilyn Baba M.D.   On: 04/07/2021 22:01   MR Brain Wo Contrast  Result Date: 04/06/2021 CLINICAL DATA:  Brain mass or lesion. Suspected meningioma along the tentorium on CT. EXAM: MRI HEAD WITHOUT CONTRAST TECHNIQUE: Multiplanar, multiecho pulse sequences of the brain and surrounding structures were obtained without intravenous contrast. COMPARISON:  CT head 03/10/2021. FINDINGS: Brain: Subcentimeter foci of trace diffusion weighted signal  hyperintensity are noted in the subcortical white matter of the posterior left temporal lobe, left parietal lobe, and right frontal lobe on axial imaging with normal ADC suggestive of potential subacute embolic  infarcts. These are not confirmed on coronal diffusion imaging although this is likely due to their small size and slice selection. No acute large territory infarct, intracranial hemorrhage, midline shift, or extra-axial fluid collection is identified. Confluent T2 hyperintensities in the cerebral white matter bilaterally are nonspecific but compatible with severe chronic small vessel ischemic disease. Chronic lacunar infarcts are noted in the cerebral white matter and basal ganglia bilaterally. There is mild cerebral atrophy. An extra-axial mass along the undersurface of the left tentorium measures 12 x 6 mm, partially calcified on CT and contacting the midbrain without significant mass effect or brain edema. Vascular: Major intracranial vascular flow voids are preserved. Skull and upper cervical spine: Unremarkable bone marrow signal. Sinuses/Orbits: Unremarkable orbits. Mild mucosal thickening in the paranasal sinuses. Clear mastoid air cells. Other: None. IMPRESSION: 1. Suspected subcentimeter subacute bilateral cerebral infarcts. 2. 12 mm mass along the left tentorium compatible with a meningioma. No mass effect or edema. 3. Severe chronic small vessel ischemic disease. Electronically Signed   By: Logan Bores M.D.   On: 04/06/2021 09:11   ECHOCARDIOGRAM COMPLETE  Result Date: 04/08/2021    ECHOCARDIOGRAM REPORT   Patient Name:   Collin Bridges Date of Exam: 04/08/2021 Medical Rec #:  [id number]             Height:       72.0 in Accession #:    [id number]            Weight:       191.0 lb Date of Birth:  1946/04/10            BSA:          2.089 m Patient Age:    42 years              BP:           186/113 mmHg Patient Gender: M                     HR:           94 bpm. Exam Location:  Inpatient Procedure: 2D Echo Indications:    stroke  History:        Patient has prior history of Echocardiogram examinations, most                 recent 02/09/2016. Chronic kidney disease.  Sonographer:    Johny Chess RDCS Referring Phys: [id number] [place]  1. There is a mobile, sub-mitral valvular echogenic mass, which is adjacent to the LVOT. This likely represents calcified redundant MV chord (benign), however in the setting of stroke, consider TEE to further evaluate. The mitral valve is abnormal. Trivial mitral valve regurgitation. No evidence of mitral stenosis.  2. Left ventricular ejection fraction, by estimation, is 60 to 65%. The left ventricle has normal function. The left ventricle has no regional wall motion abnormalities. Left ventricular diastolic parameters are consistent with Grade I diastolic dysfunction (impaired relaxation).  3. Right ventricular systolic function is normal. The right ventricular size is normal. Tricuspid regurgitation signal is inadequate for assessing PA pressure.  4. The aortic valve is abnormal. There is moderate calcification of the aortic valve. Aortic valve regurgitation is not visualized. Mild aortic valve stenosis. Aortic valve area, by VTI measures  1.80 cm. Aortic valve mean gradient measures 10.0 mmHg. Aortic valve Vmax measures 2.02 m/s.  5. The inferior vena cava is normal in size with greater than 50% respiratory variability, suggesting right atrial pressure of 3 mmHg. FINDINGS  Left Ventricle: Left ventricular ejection fraction, by estimation, is 60 to 65%. The left ventricle has normal function. The left ventricle has no regional wall motion abnormalities. The left ventricular internal cavity size was normal in size. There is  no left ventricular hypertrophy. Left ventricular diastolic parameters are consistent with Grade I diastolic dysfunction (impaired relaxation). Right Ventricle: The right ventricular size is normal. No increase in right ventricular wall thickness. Right ventricular systolic function is normal. Tricuspid regurgitation signal is inadequate for assessing PA pressure. Left Atrium: Left atrial size was normal in size. Right Atrium:  Right atrial size was normal in size. Pericardium: There is no evidence of pericardial effusion. Presence of epicardial fat layer. Mitral Valve: There is a mobile, sub-mitral valvular echogenic mass, which is adjacent to the LVOT. This likely represents calcified redundant MV chord (benign), however in the setting of stroke, consider TEE to further evaluate. The mitral valve is abnormal. There is mild calcification of the mitral valve leaflet(s). Mild to moderate mitral annular calcification. Trivial mitral valve regurgitation. No evidence of mitral valve stenosis. Tricuspid Valve: The tricuspid valve is normal in structure. Tricuspid valve regurgitation is not demonstrated. No evidence of tricuspid stenosis. Aortic Valve: The aortic valve is abnormal. There is moderate calcification of the aortic valve. Aortic valve regurgitation is not visualized. Mild aortic stenosis is present. Aortic valve mean gradient measures 10.0 mmHg. Aortic valve peak gradient measures 16.3 mmHg. Aortic valve area, by VTI measures 1.80 cm. Pulmonic Valve: The pulmonic valve was normal in structure. Pulmonic valve regurgitation is not visualized. No evidence of pulmonic stenosis. Aorta: The aortic root is normal in size and structure. Venous: The inferior vena cava is normal in size with greater than 50% respiratory variability, suggesting right atrial pressure of 3 mmHg. IAS/Shunts: No atrial level shunt detected by color flow Doppler.  LEFT VENTRICLE PLAX 2D LVIDd:         4.10 cm LVIDs:         2.70 cm LV PW:         0.90 cm LV IVS:        0.90 cm LVOT diam:     1.90 cm LV SV:         64 LV SV Index:   31 LVOT Area:     2.84 cm  RIGHT VENTRICLE             IVC RV S prime:     23.20 cm/s  IVC diam: 1.00 cm TAPSE (M-mode): 2.9 cm LEFT ATRIUM           Index        RIGHT ATRIUM           Index LA diam:      3.40 cm 1.63 cm/m   RA Area:     11.90 cm LA Vol (A4C): 34.5 ml 16.51 ml/m  RA Volume:   23.90 ml  11.44 ml/m  AORTIC VALVE AV  Area (Vmax):    1.57 cm AV Area (Vmean):   1.65 cm AV Area (VTI):     1.80 cm AV Vmax:           202.00 cm/s AV Vmean:          140.000 cm/s AV VTI:  0.354 m AV Peak Grad:      16.3 mmHg AV Mean Grad:      10.0 mmHg LVOT Vmax:         112.00 cm/s LVOT Vmean:        81.400 cm/s LVOT VTI:          0.225 m LVOT/AV VTI ratio: 0.64  AORTA Ao Root diam: 3.20 cm Ao Asc diam:  3.30 cm MITRAL VALVE MV Area (PHT): 4.68 cm     SHUNTS MV Decel Time: 162 msec     Systemic VTI:  0.22 m MV E velocity: 58.40 cm/s   Systemic Diam: 1.90 cm MV A velocity: 103.00 cm/s MV E/A ratio:  0.57 Cherlynn Kaiser MD Electronically signed by Cherlynn Kaiser MD Signature Date/Time: 04/08/2021/2:21:07 PM    Final     DISCHARGE EXAMINATION: Vitals:   04/07/21 2127 04/07/21 2353 04/08/21 0358 04/08/21 0735  BP: (!) 156/99 140/73 (!) 187/98 (!) 186/113  Pulse: 87 91 91 86  Resp: 18 17 17 16   Temp: 98.2 F (36.8 C) (!) 97.5 F (36.4 C) (!) 97.5 F (36.4 C) 97.9 F (36.6 C)  TempSrc: Oral Oral Oral Oral  SpO2: 96% 95% 96% 98%  Weight:      Height:       General appearance: Awake alert.  In no distress Resp: Clear to auscultation bilaterally.  Normal effort Cardio: S1-S2 is normal regular.  No S3-S4.  No rubs murmurs or bruit GI: Abdomen is soft.  Nontender nondistended.  Bowel sounds are present normal.  No masses organomegaly    DISPOSITION: Home  Discharge Instructions     Ambulatory referral to Neurology   Complete by: As directed    An appointment is requested in approximately: 8 weeks for acute stroke   Call MD for:  difficulty breathing, headache or visual disturbances   Complete by: As directed    Call MD for:  extreme fatigue   Complete by: As directed    Call MD for:  persistant dizziness or light-headedness   Complete by: As directed    Call MD for:  persistant nausea and vomiting   Complete by: As directed    Call MD for:  severe uncontrolled pain   Complete by: As directed    Call MD  for:  temperature >100.4   Complete by: As directed    Diet - low sodium heart healthy   Complete by: As directed    Discharge instructions   Complete by: As directed    Please take your medications as prescribed.  Follow-up with your primary care provider in 1 week.  Referral will be sent to neurology office for follow-up.  The cardiologist office will arrange for an outpatient TEE.  You should get a call from them sometime next week.  You were cared for by a hospitalist during your hospital stay. If you have any questions about your discharge medications or the care you received while you were in the hospital after you are discharged, you can call the unit and asked to speak with the hospitalist on call if the hospitalist that took care of you is not available. Once you are discharged, your primary care physician will handle any further medical issues. Please note that NO REFILLS for any discharge medications will be authorized once you are discharged, as it is imperative that you return to your primary care physician (or establish a relationship with a primary care physician if you do not have one) for  your aftercare needs so that they can reassess your need for medications and monitor your lab values. If you do not have a primary care physician, you can call [phone] for a physician referral.   Increase activity slowly   Complete by: As directed           Allergies as of 04/08/2021   No Active Allergies      Medication List     STOP taking these medications    traMADol 50 MG tablet Commonly known as: ULTRAM       TAKE these medications    aspirin 81 MG EC tablet Take 1 tablet (81 mg total) by mouth daily for 20 days. Swallow whole. What changed:  medication strength how much to take additional instructions   atorvastatin 40 MG tablet Commonly known as: LIPITOR Take 1 tablet (40 mg total) by mouth daily.   clopidogrel 75 MG tablet Commonly known as: PLAVIX Take 1  tablet (75 mg total) by mouth daily.   fluticasone 50 MCG/ACT nasal spray Commonly known as: FLONASE USE 2 SPRAYS IN EACH NOSTRIL DAILY What changed:  how much to take when to take this additional instructions   ibuprofen 200 MG tablet Commonly known as: ADVIL Take 200 mg by mouth every 6 (six) hours as needed for mild pain.   loratadine 10 MG tablet Commonly known as: CLARITIN Take 1 tablet (10 mg total) by mouth daily.   losartan 50 MG tablet Commonly known as: COZAAR Take 2 tablets (100 mg total) by mouth daily. What changed: how much to take   PARoxetine 40 MG tablet Commonly known as: Paxil Take 1 tablet (40 mg total) by mouth every morning.   QUEtiapine 50 MG tablet Commonly known as: SEROquel Take 1 tablet (50 mg total) by mouth 2 (two) times daily.   zolpidem 10 MG tablet Commonly known as: AMBIEN TAKE 1 TABLET(10 MG) BY MOUTH AT BEDTIME AS NEEDED FOR SLEEP What changed: See the new instructions.          Follow-up Information     Biagio Borg, MD Follow up in 1 week(s).   Specialties: Internal Medicine, Radiology Contact information: [place] [address] [phone]                 TOTAL DISCHARGE TIME: 29 minutes  [place]  [facility] Pager on [email or web address]  04/09/2021, 11:25 AM

## 2021-04-16 ENCOUNTER — Other Ambulatory Visit: Payer: Self-pay

## 2021-04-16 ENCOUNTER — Ambulatory Visit: Payer: Medicare (Managed Care) | Admitting: Internal Medicine

## 2021-04-16 VITALS — BP 146/86 | HR 84 | Temp 98.1°F | Ht 72.0 in | Wt 196.0 lb

## 2021-04-16 DIAGNOSIS — I10 Essential (primary) hypertension: Secondary | ICD-10-CM | POA: Diagnosis not present

## 2021-04-16 DIAGNOSIS — N1831 Chronic kidney disease, stage 3a: Secondary | ICD-10-CM | POA: Diagnosis not present

## 2021-04-16 DIAGNOSIS — G9389 Other specified disorders of brain: Secondary | ICD-10-CM

## 2021-04-16 DIAGNOSIS — Z8673 Personal history of transient ischemic attack (TIA), and cerebral infarction without residual deficits: Secondary | ICD-10-CM

## 2021-04-16 DIAGNOSIS — E7800 Pure hypercholesterolemia, unspecified: Secondary | ICD-10-CM

## 2021-04-16 DIAGNOSIS — I6389 Other cerebral infarction: Secondary | ICD-10-CM

## 2021-04-16 NOTE — Patient Instructions (Addendum)
Please continue all other medications as before, but remember the Clopidogrel (plavix) is only supposed to taken for a total of 3 weeks (then stop)  You are eventually supposed to be called by Neurology and Cardiology  Please have the pharmacy call with any other refills you may need.  Please continue your efforts at being more active, low cholesterol diet, and weight control.  Please keep your appointments with your specialists as you may have planned  Please make an Appointment to return in 1 months, or sooner if needed, and we will plan to check your cholesterol and other lab testing at that time

## 2021-04-16 NOTE — Progress Notes (Signed)
Patient ID: Collin Bridges, male   DOB: 11-26-1945, 75 y.o.   MRN: [id number]        Chief Complaint: follow up recent bilateral subacute infarcts       HPI:  Collin Bridges is a 75 y.o. male here to f/u, overall doing well with near resolved HA and balance improved subjectively post hosp.   Not yet contacted by neurology or cardiology for possible TEE  Pt denies chest pain, increased sob or doe, wheezing, orthopnea, PND, increased LE swelling, palpitations, dizziness or syncope.   Pt denies polydipsia, polyuria, or new neuro s/s.   Pt denies fever, wt loss, night sweats, loss of appetite, or other constitutional symptoms  Tolerating new lipitor 40    Wt Readings from Last 3 Encounters:  04/16/21 196 lb (88.9 kg)  04/07/21 191 lb (86.6 kg)  03/22/21 191 lb (86.6 kg)   BP Readings from Last 3 Encounters:  04/16/21 (!) 146/86  04/08/21 (!) 186/113  03/22/21 130/80         Past Medical History:  Diagnosis Date   Allergy    seasonal   Anxiety    Depression    Headache    Hepatitis C    HLD (hyperlipidemia) 08/09/2017   Hypertension    Hypoglycemia    Past Surgical History:  Procedure Laterality Date   APPENDECTOMY     TONSILECTOMY, ADENOIDECTOMY, BILATERAL MYRINGOTOMY AND TUBES      reports that he has quit smoking. He has never used smokeless tobacco. He reports that he does not drink alcohol and does not use drugs. family history includes Hyperlipidemia in his father; Hypertension in his father. No Active Allergies Current Outpatient Medications on File Prior to Visit  Medication Sig Dispense Refill   aspirin EC 81 MG EC tablet Take 1 tablet (81 mg total) by mouth daily for 20 days. Swallow whole. 20 tablet 0   atorvastatin (LIPITOR) 40 MG tablet Take 1 tablet (40 mg total) by mouth daily. 30 tablet 2   clopidogrel (PLAVIX) 75 MG tablet Take 1 tablet (75 mg total) by mouth daily. 30 tablet 2   fluticasone (FLONASE) 50 MCG/ACT nasal spray USE 2 SPRAYS IN EACH  NOSTRIL DAILY (Patient taking differently: 2 sprays daily.) 48 g 3   ibuprofen (ADVIL) 200 MG tablet Take 200 mg by mouth every 6 (six) hours as needed for mild pain.     loratadine (CLARITIN) 10 MG tablet Take 1 tablet (10 mg total) by mouth daily. 90 tablet 3   losartan (COZAAR) 50 MG tablet Take 2 tablets (100 mg total) by mouth daily. 60 tablet 1   PARoxetine (PAXIL) 40 MG tablet Take 1 tablet (40 mg total) by mouth every morning. 90 tablet 3   QUEtiapine (SEROQUEL) 50 MG tablet Take 1 tablet (50 mg total) by mouth 2 (two) times daily. 180 tablet 3   zolpidem (AMBIEN) 10 MG tablet TAKE 1 TABLET(10 MG) BY MOUTH AT BEDTIME AS NEEDED FOR SLEEP (Patient taking differently: Take 5-10 mg by mouth at bedtime as needed for sleep.) 90 tablet 1   No current facility-administered medications on file prior to visit.        ROS:  All others reviewed and negative.  Objective        PE:  BP (!) 146/86   Pulse 84   Temp 98.1 F (36.7 C) (Oral)   Ht 6' (1.829 m)   Wt 196 lb (88.9 kg)   SpO2 100%   BMI 26.58  kg/m                 Constitutional: Pt appears in NAD               HENT: Head: NCAT.                Right Ear: External ear normal.                 Left Ear: External ear normal.                Eyes: . Pupils are equal, round, and reactive to light. Conjunctivae and EOM are normal               Nose: without d/c or deformity               Neck: Neck supple. Gross normal ROM               Cardiovascular: Normal rate and regular rhythm.                 Pulmonary/Chest: Effort normal and breath sounds without rales or wheezing.                Abd:  Soft, NT, ND, + BS, no organomegaly               Neurological: Pt is alert. At baseline orientation, motor grossly intact               Skin: Skin is warm. No rashes, no other new lesions, LE edema - none               Psychiatric: Pt behavior is normal without agitation   Micro: none  Cardiac tracings I have personally interpreted today:   none  Pertinent Radiological findings (summarize): none   Lab Results  Component Value Date   WBC 6.6 04/07/2021   HGB 13.9 04/07/2021   HCT 40.3 04/07/2021   PLT 135 (L) 04/07/2021   GLUCOSE 227 (H) 04/07/2021   CHOL 246 (H) 04/08/2021   TRIG 477 (H) 04/08/2021   HDL 34 (L) 04/08/2021   LDLDIRECT 110.6 (H) 04/08/2021   LDLCALC UNABLE TO CALCULATE IF TRIGLYCERIDE OVER 400 mg/dL 04/08/2021   ALT 22 04/07/2021   AST 35 04/07/2021   NA 138 04/07/2021   K 4.5 04/07/2021   CL 103 04/07/2021   CREATININE 1.37 (H) 04/07/2021   BUN 13 04/07/2021   CO2 28 04/07/2021   TSH 0.99 05/22/2020   PSA 2.73 12/14/2020   INR 1.0 04/07/2021   HGBA1C 5.8 (H) 04/08/2021   MICROALBUR 2.8 (H) 05/22/2020   Assessment/Plan:  Collin Bridges is a 75 y.o. White or Caucasian [1] male with  has a past medical history of Allergy, Anxiety, Depression, Headache, Hepatitis C, HLD (hyperlipidemia) (08/09/2017), Hypertension, and Hypoglycemia.  History of stroke Stable overall, to continue current med tx including DAPT for 3 wks, then ASA 81 after, to f/u Neurology at 6-8 wks, and will refer Cards for possible TEE  Essential hypertension BP Readings from Last 3 Encounters:  04/16/21 (!) 146/86  04/08/21 (!) 186/113  03/22/21 130/80   Mild uncontrolled here, pt states better controlled at home,  pt to continue medical treatment losartan as declines change for now   HLD (hyperlipidemia) Lab Results  Component Value Date   LDLCALC UNABLE TO CALCULATE IF TRIGLYCERIDE OVER 400 mg/dL 04/08/2021   Uncontrolled, goal ldl < 70, pt to continue new statin lipitor 40,  f/u lab next visit   CKD (chronic kidney disease) stage 3, GFR 30-59 ml/min (HCC) Lab Results  Component Value Date   CREATININE 1.37 (H) 04/07/2021   Stable overall, cont to avoid nephrotoxins   Brain mass Incidental, likely meningioma, also f/u neurology  Followup: Return in about 4 weeks (around 05/14/2021).  [name], MD  04/25/2021 1:39 PM [facility]

## 2021-04-25 ENCOUNTER — Encounter: Payer: Self-pay | Admitting: Internal Medicine

## 2021-04-25 DIAGNOSIS — Z8673 Personal history of transient ischemic attack (TIA), and cerebral infarction without residual deficits: Secondary | ICD-10-CM | POA: Insufficient documentation

## 2021-04-25 NOTE — Assessment & Plan Note (Signed)
Incidental, likely meningioma, also f/u neurology

## 2021-04-25 NOTE — Assessment & Plan Note (Signed)
BP Readings from Last 3 Encounters:  04/16/21 (!) 146/86  04/08/21 (!) 186/113  03/22/21 130/80   Mild uncontrolled here, pt states better controlled at home,  pt to continue medical treatment losartan as declines change for now

## 2021-04-25 NOTE — Assessment & Plan Note (Signed)
Lab Results  Component Value Date   CREATININE 1.37 (H) 04/07/2021   Stable overall, cont to avoid nephrotoxins

## 2021-04-25 NOTE — Assessment & Plan Note (Signed)
Stable overall, to continue current med tx including DAPT for 3 wks, then ASA 81 after, to f/u Neurology at 6-8 wks, and will refer Cards for possible TEE

## 2021-04-25 NOTE — Assessment & Plan Note (Signed)
Lab Results  Component Value Date   LDLCALC UNABLE TO CALCULATE IF TRIGLYCERIDE OVER 400 mg/dL 04/08/2021   Uncontrolled, goal ldl < 70, pt to continue new statin lipitor 40, f/u lab next visit

## 2021-04-26 ENCOUNTER — Encounter: Payer: Self-pay | Admitting: Internal Medicine

## 2021-04-26 MED ORDER — LOSARTAN POTASSIUM 100 MG PO TABS: 100.0000 mg | ORAL_TABLET | Freq: Every day | ORAL | 3 refills | Status: DC

## 2021-04-26 NOTE — Telephone Encounter (Signed)
Left message for patient to call me back. Unsure of what this test is. Do you know of a TEE test?

## 2021-04-26 NOTE — Telephone Encounter (Signed)
I have already referred the pt on dec 2,  that is the best I can do thanks

## 2021-04-27 NOTE — Telephone Encounter (Signed)
Pt has returned Taylor's call about TEE test. Please call back.    Callback #- [phone]

## 2021-05-01 ENCOUNTER — Encounter: Payer: Self-pay | Admitting: Internal Medicine

## 2021-05-04 MED ORDER — ASPIRIN 81 MG PO CAPS: 81.0000 mg | ORAL_CAPSULE | Freq: Every day | ORAL | 2 refills | Status: DC

## 2021-05-04 MED ORDER — PAROXETINE HCL 40 MG PO TABS: 40.0000 mg | ORAL_TABLET | ORAL | 2 refills | Status: DC

## 2021-05-04 MED ORDER — LORATADINE 10 MG PO TABS: 10.0000 mg | ORAL_TABLET | Freq: Every day | ORAL | 2 refills | Status: DC

## 2021-05-18 ENCOUNTER — Ambulatory Visit: Payer: Medicare (Managed Care) | Admitting: Internal Medicine

## 2021-05-21 NOTE — H&P (View-Only) (Signed)
Cardiology Office Note:   Date:  05/24/2021  NAME:  Collin Bridges    MRN: [id number] DOB:  July 09, 1945   PCP:  Biagio Borg, MD  Cardiologist:  None  Electrophysiologist:  None   Referring MD: Biagio Borg, MD   Chief Complaint  Patient presents with   Stroke Symptoms    History of Present Illness:   Collin Bridges is a 76 y.o. male with a hx of CVA, hypertension, hyperlipidemia who is being seen today for the evaluation of CVA/mitral valve mass at the request of Biagio Borg, MD. [name] had a CT scan performed in October 2022 after a fall.  Findings were concerning for possible meningioma.  He underwent outpatient brain MRI that showed subacute bilateral cerebellar infarcts.  He did have a meningioma found without mass-effect.  There was severe small vessel ischemic disease noted.  He underwent an echocardiogram when he was admitted to the hospital.  This is concerning for a mitral valve mass.  It appears to be calcified subchordal tissue.  He presents for follow-up today for transesophageal echo and further work-up.  He denies any further strokelike symptoms.  Reports no chest pain or trouble breathing.  He does have elevated triglycerides.  We discussed adding Vascepa.  He is okay to do this.  He has never had a stroke before.  The diagnosis of subacute stroke was quite concerning to him.  He is now on aspirin and Plavix.  He will see neurology next month.  He denies any chest pain or trouble breathing.  EKG demonstrates sinus tachycardia heart rate 108 with no acute ischemic changes or evidence of infarction.  He is a never smoker.  He is not married.  He does not have any children.  His blood pressure appears to be well controlled in office today.  His mother had a history of lung disease.  Father had a history of heart disease.  Overall without complaints today.  No further strokelike symptoms.  We discussed his echocardiogram which is likely just redundant chordal  tissue.  We discussed risk and benefits of transesophageal echo.  He is willing to proceed with this.  Problem List CVA -bilateral cerebral infarcts  -severe intracranial atherosclerosis/lacunar infarct  2. Meningioma  3. HTN 4. CKD 3 5. HLD -T chol 246, HDL 34, TG 447 -A1c 5.8  Past Medical History: Past Medical History:  Diagnosis Date   Allergy    seasonal   Anxiety    Depression    Headache    Hepatitis C    HLD (hyperlipidemia) 08/09/2017   Hypertension    Hypoglycemia    Stroke [facility])     Past Surgical History: Past Surgical History:  Procedure Laterality Date   APPENDECTOMY     TONSILECTOMY, ADENOIDECTOMY, BILATERAL MYRINGOTOMY AND TUBES      Current Medications: Current Meds  Medication Sig   Aspirin 81 MG CAPS Take 81 mg by mouth daily.   atorvastatin (LIPITOR) 40 MG tablet Take 1 tablet (40 mg total) by mouth daily.   clopidogrel (PLAVIX) 75 MG tablet Take 1 tablet (75 mg total) by mouth daily.   fluticasone (FLONASE) 50 MCG/ACT nasal spray USE 2 SPRAYS IN EACH NOSTRIL DAILY (Patient taking differently: 2 sprays daily.)   ibuprofen (ADVIL) 200 MG tablet Take 200 mg by mouth every 6 (six) hours as needed for mild pain.   icosapent Ethyl (VASCEPA) 1 g capsule Take 2 capsules (2 g total) by mouth 2 (two) times  daily.   loratadine (CLARITIN) 10 MG tablet Take 1 tablet (10 mg total) by mouth daily.   losartan (COZAAR) 100 MG tablet Take 1 tablet (100 mg total) by mouth daily.   PARoxetine (PAXIL) 40 MG tablet Take 1 tablet (40 mg total) by mouth every morning.   QUEtiapine (SEROQUEL) 50 MG tablet Take 1 tablet (50 mg total) by mouth 2 (two) times daily.   zolpidem (AMBIEN) 10 MG tablet TAKE 1 TABLET(10 MG) BY MOUTH AT BEDTIME AS NEEDED FOR SLEEP (Patient taking differently: Take 5-10 mg by mouth at bedtime as needed for sleep.)     Allergies:    Patient has no active allergies.   Social History: Social History   Socioeconomic History   Marital status:  Single    Spouse name: Not on file   Number of children: Not on file   Years of education: Not on file   Highest education level: Not on file  Occupational History   Not on file  Tobacco Use   Smoking status: Former   Smokeless tobacco: Never   Tobacco comments:    quit 52yrs ago, smoke a cigar occassionally.  Substance and Sexual Activity   Alcohol use: No   Drug use: No   Sexual activity: Yes  Other Topics Concern   Not on file  Social History Narrative   Not on file   Social Determinants of Health   Financial Resource Strain: Not on file  Food Insecurity: Not on file  Transportation Needs: Not on file  Physical Activity: Not on file  Stress: Not on file  Social Connections: Not on file     Family History: The patient's family history includes Hyperlipidemia in his father; Hypertension in his father; Lung disease in his mother.  ROS:   All other ROS reviewed and negative. Pertinent positives noted in the HPI.     EKGs/Labs/Other Studies Reviewed:   The following studies were personally reviewed by me today:  EKG:  EKG is ordered today.  The ekg ordered today demonstrates sinus tachycardia 108 bpm, and was personally reviewed by me.   TTE 04/08/2021  1. There is a mobile, sub-mitral valvular echogenic mass, which is  adjacent to the LVOT. This likely represents calcified redundant MV chord  (benign), however in the setting of stroke, consider TEE to further  evaluate. The mitral valve is abnormal.  Trivial mitral valve regurgitation. No evidence of mitral stenosis.   2. Left ventricular ejection fraction, by estimation, is 60 to 65%. The  left ventricle has normal function. The left ventricle has no regional  wall motion abnormalities. Left ventricular diastolic parameters are  consistent with Grade I diastolic  dysfunction (impaired relaxation).   3. Right ventricular systolic function is normal. The right ventricular  size is normal. Tricuspid regurgitation  signal is inadequate for assessing  PA pressure.   4. The aortic valve is abnormal. There is moderate calcification of the  aortic valve. Aortic valve regurgitation is not visualized. Mild aortic  valve stenosis. Aortic valve area, by VTI measures 1.80 cm. Aortic valve  mean gradient measures 10.0 mmHg.  Aortic valve Vmax measures 2.02 m/s.   5. The inferior vena cava is normal in size with greater than 50%  respiratory variability, suggesting right atrial pressure of 3 mmHg.   Recent Labs: 04/07/2021: ALT 22; BUN 13; Creatinine, Ser 1.37; Hemoglobin 13.9; Platelets 135; Potassium 4.5; Sodium 138   Recent Lipid Panel    Component Value Date/Time   CHOL 246 (  H) 04/08/2021 0102   TRIG 477 (H) 04/08/2021 0102   HDL 34 (L) 04/08/2021 0102   CHOLHDL 7.2 04/08/2021 0102   VLDL UNABLE TO CALCULATE IF TRIGLYCERIDE OVER 400 mg/dL 04/08/2021 0102   LDLCALC UNABLE TO CALCULATE IF TRIGLYCERIDE OVER 400 mg/dL 04/08/2021 0102   LDLDIRECT 110.6 (H) 04/08/2021 0102    Physical Exam:   VS:  BP 140/72    Pulse (!) 108    Ht 6' (1.829 m)    Wt 197 lb 12.8 oz (89.7 kg)    SpO2 97%    BMI 26.83 kg/m    Wt Readings from Last 3 Encounters:  05/24/21 197 lb 12.8 oz (89.7 kg)  04/16/21 196 lb (88.9 kg)  04/07/21 191 lb (86.6 kg)    General: Well nourished, well developed, in no acute distress Head: Atraumatic, normal size  Eyes: PEERLA, EOMI  Neck: Supple, no JVD Endocrine: No thryomegaly Cardiac: Normal S1, S2; RRR; no murmurs, rubs, or gallops Lungs: Clear to auscultation bilaterally, no wheezing, rhonchi or rales  Abd: Soft, nontender, no hepatomegaly  Ext: No edema, pulses 2+ Musculoskeletal: No deformities, BUE and BLE strength normal and equal Skin: Warm and dry, no rashes   Neuro: Alert and oriented to person, place, time, and situation, CNII-XII grossly intact, no focal deficits  Psych: Normal mood and affect   ASSESSMENT:   Ashtin Rosner is a 76 y.o. male who presents for the  following: 1. Cerebrovascular accident (CVA), unspecified mechanism ([place])   2. Mixed hyperlipidemia   3. Mitral valve mass     PLAN:   1. Cerebrovascular accident (CVA), unspecified mechanism ([place]) 2. Mixed hyperlipidemia 3. Mitral valve mass -Recently found to have cerebral infarcts which were subacute.  No further strokelike symptoms.  Review of MRI shows severe intracranial atherosclerosis and diffuse lacunar infarcts.  I truly believe these are small vessel strokes and not related to any large vessel occlusion. -He did have an echocardiogram which showed redundant chordal tissue.  We will proceed with transesophageal echo to exclude any sinister cardiac pathology.  I believe this will be normal.  Risk and benefits of transesophageal echo explained.  He is willing to proceed. -I do not believe that extended ambulatory ECG monitoring is warranted.  We will have him evaluated by neurology first.  Given severe intracranial atherosclerosis I believe we have the etiology here. -I have recommended good blood pressure control.  He will continue current agents.  He will continue aspirin and Plavix.  Further guidance per neurology. -I have recommended he start Vascepa 2 g twice daily.  His triglycerides are uncontrolled.  He should continue Lipitor 40 mg daily.  Given his history of strokes triglycerides should be less than 150.  This can be rechecked with his primary care physician once he is fasting.  Shared Decision Making/Informed Consent The risks [esophageal damage, perforation (1:10,000 risk), bleeding, pharyngeal hematoma as well as other potential complications associated with conscious sedation including aspiration, arrhythmia, respiratory failure and death], benefits (treatment guidance and diagnostic support) and alternatives of a transesophageal echocardiogram were discussed in detail with Mr. Gratz and he is willing to proceed.    Disposition: Return if symptoms worsen or fail to  improve.  Medication Adjustments/Labs and Tests Ordered: Current medicines are reviewed at length with the patient today.  Concerns regarding medicines are outlined above.  Orders Placed This Encounter  Procedures   Basic metabolic panel   CBC   TSH   EKG 12-Lead   Meds ordered  this encounter  Medications   icosapent Ethyl (VASCEPA) 1 g capsule    Sig: Take 2 capsules (2 g total) by mouth 2 (two) times daily.    Dispense:  120 capsule    Refill:  2    Patient Instructions  Medication Instructions:  START Vascepa 2 grams twice daily.   *If you need a refill on your cardiac medications before your next appointment, please call your pharmacy*   Lab Work: CBC, BMET, TSH today   If you have labs (blood work) drawn today and your tests are completely normal, you will receive your results only by: [place] (if you have MyChart) OR A paper copy in the mail If you have any lab test that is abnormal or we need to change your treatment, we will call you to review the results.   Testing/Procedures: Your physician has requested that you have a TEE. During a TEE, sound waves are used to create images of your heart. It provides your doctor with information about the size and shape of your heart and how well your hearts chambers and valves are working. In this test, a transducer is attached to the end of a flexible tube thats guided down your throat and into your esophagus (the tube leading from you mouth to your stomach) to get a more detailed image of your heart. You are not awake for the procedure. Please see the instruction sheet given to you today. For further information please visit [email or web address].   Follow-Up: At [facility], you and your health needs are our priority.  As part of our continuing mission to provide you with exceptional heart care, we have created designated Provider Care Teams.  These Care Teams include your primary Cardiologist (physician) and  Advanced Practice Providers (APPs -  Physician Assistants and Nurse Practitioners) who all work together to provide you with the care you need, when you need it.  We recommend signing up for the patient portal called "MyChart".  Sign up information is provided on this After Visit Summary.  MyChart is used to connect with patients for Virtual Visits (Telemedicine).  Patients are able to view lab/test results, encounter notes, upcoming appointments, etc.  Non-urgent messages can be sent to your provider as well.   To learn more about what you can do with MyChart, go to [email or web address].    Your next appointment:   As needed  The format for your next appointment:   In Person  Provider:   Eleonore Chiquito, MD    Other Instruction:  You are scheduled for a TEE on Feb 1st with Dr. Audie Box.  Please arrive at the [facility] (Main Entrance A) at [facility]: [place], [place] [address] at 8 am. (1 hour prior to procedure unless lab work is needed; if lab work is needed arrive 1.5 hours ahead)  DIET: Nothing to eat or drink after midnight except a sip of water with medications (see medication instructions below)  FYI: For your safety, and to allow [place] to monitor your vital signs accurately during the surgery/procedure we request that   if you have artificial nails, gel coating, SNS etc. Please have those removed prior to your surgery/procedure. Not having the nail coverings /polish removed may result in cancellation or delay of your surgery/procedure.  Labs: CBC, BMET, TSH today    You must have a responsible person to drive you home and stay in the waiting area during your procedure. Failure to do so could result  in cancellation.  Bring your insurance cards.  *Special Note: Every effort is made to have your procedure done on time. Occasionally there are emergencies that occur at the hospital that may cause delays. Please be patient if a delay does occur.         Signed, Addison Naegeli. Audie Box, MD, [place]  [address], [place], [place] [address] [phone]  05/24/2021 8:58 AM

## 2021-05-21 NOTE — Progress Notes (Signed)
Cardiology Office Note:   Date:  05/24/2021  NAME:  Collin Bridges    MRN: [id number] DOB:  [birth year]   PCP:  Biagio Borg, MD  Cardiologist:  None  Electrophysiologist:  None   Referring MD: Biagio Borg, MD   Chief Complaint  Patient presents with   Stroke Symptoms    History of Present Illness:   Collin Bridges is a 76 y.o. male with a hx of CVA, hypertension, hyperlipidemia who is being seen today for the evaluation of CVA/mitral valve mass at the request of Biagio Borg, MD. Collin Bridges had a CT scan performed in October 2022 after a fall.  Findings were concerning for possible meningioma.  He underwent outpatient brain MRI that showed subacute bilateral cerebellar infarcts.  He did have a meningioma found without mass-effect.  There was severe small vessel ischemic disease noted.  He underwent an echocardiogram when he was admitted to the hospital.  This is concerning for a mitral valve mass.  It appears to be calcified subchordal tissue.  He presents for follow-up today for transesophageal echo and further work-up.  He denies any further strokelike symptoms.  Reports no chest pain or trouble breathing.  He does have elevated triglycerides.  We discussed adding Vascepa.  He is okay to do this.  He has never had a stroke before.  The diagnosis of subacute stroke was quite concerning to him.  He is now on aspirin and Plavix.  He will see neurology next month.  He denies any chest pain or trouble breathing.  EKG demonstrates sinus tachycardia heart rate 108 with no acute ischemic changes or evidence of infarction.  He is a never smoker.  He is not married.  He does not have any children.  His blood pressure appears to be well controlled in office today.  His mother had a history of lung disease.  Father had a history of heart disease.  Overall without complaints today.  No further strokelike symptoms.  We discussed his echocardiogram which is likely just redundant chordal  tissue.  We discussed risk and benefits of transesophageal echo.  He is willing to proceed with this.  Problem List CVA -bilateral cerebral infarcts  -severe intracranial atherosclerosis/lacunar infarct  2. Meningioma  3. HTN 4. CKD 3 5. HLD -T chol 246, HDL 34, TG 447 -A1c 5.8  Past Medical History: Past Medical History:  Diagnosis Date   Allergy    seasonal   Anxiety    Depression    Headache    Hepatitis C    HLD (hyperlipidemia) 08/09/2017   Hypertension    Hypoglycemia    Stroke [facility])     Past Surgical History: Past Surgical History:  Procedure Laterality Date   APPENDECTOMY     TONSILECTOMY, ADENOIDECTOMY, BILATERAL MYRINGOTOMY AND TUBES      Current Medications: Current Meds  Medication Sig   Aspirin 81 MG CAPS Take 81 mg by mouth daily.   atorvastatin (LIPITOR) 40 MG tablet Take 1 tablet (40 mg total) by mouth daily.   clopidogrel (PLAVIX) 75 MG tablet Take 1 tablet (75 mg total) by mouth daily.   fluticasone (FLONASE) 50 MCG/ACT nasal spray USE 2 SPRAYS IN EACH NOSTRIL DAILY (Patient taking differently: 2 sprays daily.)   ibuprofen (ADVIL) 200 MG tablet Take 200 mg by mouth every 6 (six) hours as needed for mild pain.   icosapent Ethyl (VASCEPA) 1 g capsule Take 2 capsules (2 g total) by mouth 2 (two) times  daily.   loratadine (CLARITIN) 10 MG tablet Take 1 tablet (10 mg total) by mouth daily.   losartan (COZAAR) 100 MG tablet Take 1 tablet (100 mg total) by mouth daily.   PARoxetine (PAXIL) 40 MG tablet Take 1 tablet (40 mg total) by mouth every morning.   QUEtiapine (SEROQUEL) 50 MG tablet Take 1 tablet (50 mg total) by mouth 2 (two) times daily.   zolpidem (AMBIEN) 10 MG tablet TAKE 1 TABLET(10 MG) BY MOUTH AT BEDTIME AS NEEDED FOR SLEEP (Patient taking differently: Take 5-10 mg by mouth at bedtime as needed for sleep.)     Allergies:    Patient has no active allergies.   Social History: Social History   Socioeconomic History   Marital status:  Single    Spouse name: Not on file   Number of children: Not on file   Years of education: Not on file   Highest education level: Not on file  Occupational History   Not on file  Tobacco Use   Smoking status: Former   Smokeless tobacco: Never   Tobacco comments:    quit 22yrs ago, smoke a cigar occassionally.  Substance and Sexual Activity   Alcohol use: No   Drug use: No   Sexual activity: Yes  Other Topics Concern   Not on file  Social History Narrative   Not on file   Social Determinants of Health   Financial Resource Strain: Not on file  Food Insecurity: Not on file  Transportation Needs: Not on file  Physical Activity: Not on file  Stress: Not on file  Social Connections: Not on file     Family History: The patient's family history includes Hyperlipidemia in his father; Hypertension in his father; Lung disease in his mother.  ROS:   All other ROS reviewed and negative. Pertinent positives noted in the HPI.     EKGs/Labs/Other Studies Reviewed:   The following studies were personally reviewed by me today:  EKG:  EKG is ordered today.  The ekg ordered today demonstrates sinus tachycardia 108 bpm, and was personally reviewed by me.   TTE 04/08/2021  1. There is a mobile, sub-mitral valvular echogenic mass, which is  adjacent to the LVOT. This likely represents calcified redundant MV chord  (benign), however in the setting of stroke, consider TEE to further  evaluate. The mitral valve is abnormal.  Trivial mitral valve regurgitation. No evidence of mitral stenosis.   2. Left ventricular ejection fraction, by estimation, is 60 to 65%. The  left ventricle has normal function. The left ventricle has no regional  wall motion abnormalities. Left ventricular diastolic parameters are  consistent with Grade I diastolic  dysfunction (impaired relaxation).   3. Right ventricular systolic function is normal. The right ventricular  size is normal. Tricuspid regurgitation  signal is inadequate for assessing  PA pressure.   4. The aortic valve is abnormal. There is moderate calcification of the  aortic valve. Aortic valve regurgitation is not visualized. Mild aortic  valve stenosis. Aortic valve area, by VTI measures 1.80 cm. Aortic valve  mean gradient measures 10.0 mmHg.  Aortic valve Vmax measures 2.02 m/s.   5. The inferior vena cava is normal in size with greater than 50%  respiratory variability, suggesting right atrial pressure of 3 mmHg.   Recent Labs: 04/07/2021: ALT 22; BUN 13; Creatinine, Ser 1.37; Hemoglobin 13.9; Platelets 135; Potassium 4.5; Sodium 138   Recent Lipid Panel    Component Value Date/Time   CHOL 246 (  H) 04/08/2021 0102   TRIG 477 (H) 04/08/2021 0102   HDL 34 (L) 04/08/2021 0102   CHOLHDL 7.2 04/08/2021 0102   VLDL UNABLE TO CALCULATE IF TRIGLYCERIDE OVER 400 mg/dL 04/08/2021 0102   LDLCALC UNABLE TO CALCULATE IF TRIGLYCERIDE OVER 400 mg/dL 04/08/2021 0102   LDLDIRECT 110.6 (H) 04/08/2021 0102    Physical Exam:   VS:  BP 140/72    Pulse (!) 108    Ht 6' (1.829 m)    Wt 197 lb 12.8 oz (89.7 kg)    SpO2 97%    BMI 26.83 kg/m    Wt Readings from Last 3 Encounters:  05/24/21 197 lb 12.8 oz (89.7 kg)  04/16/21 196 lb (88.9 kg)  04/07/21 191 lb (86.6 kg)    General: Well nourished, well developed, in no acute distress Head: Atraumatic, normal size  Eyes: PEERLA, EOMI  Neck: Supple, no JVD Endocrine: No thryomegaly Cardiac: Normal S1, S2; RRR; no murmurs, rubs, or gallops Lungs: Clear to auscultation bilaterally, no wheezing, rhonchi or rales  Abd: Soft, nontender, no hepatomegaly  Ext: No edema, pulses 2+ Musculoskeletal: No deformities, BUE and BLE strength normal and equal Skin: Warm and dry, no rashes   Neuro: Alert and oriented to person, place, time, and situation, CNII-XII grossly intact, no focal deficits  Psych: Normal mood and affect   ASSESSMENT:   Khadar Monger is a 76 y.o. male who presents for the  following: 1. Cerebrovascular accident (CVA), unspecified mechanism ([place])   2. Mixed hyperlipidemia   3. Mitral valve mass     PLAN:   1. Cerebrovascular accident (CVA), unspecified mechanism ([place]) 2. Mixed hyperlipidemia 3. Mitral valve mass -Recently found to have cerebral infarcts which were subacute.  No further strokelike symptoms.  Review of MRI shows severe intracranial atherosclerosis and diffuse lacunar infarcts.  I truly believe these are small vessel strokes and not related to any large vessel occlusion. -He did have an echocardiogram which showed redundant chordal tissue.  We will proceed with transesophageal echo to exclude any sinister cardiac pathology.  I believe this will be normal.  Risk and benefits of transesophageal echo explained.  He is willing to proceed. -I do not believe that extended ambulatory ECG monitoring is warranted.  We will have him evaluated by neurology first.  Given severe intracranial atherosclerosis I believe we have the etiology here. -I have recommended good blood pressure control.  He will continue current agents.  He will continue aspirin and Plavix.  Further guidance per neurology. -I have recommended he start Vascepa 2 g twice daily.  His triglycerides are uncontrolled.  He should continue Lipitor 40 mg daily.  Given his history of strokes triglycerides should be less than 150.  This can be rechecked with his primary care physician once he is fasting.  Shared Decision Making/Informed Consent The risks [esophageal damage, perforation (1:10,000 risk), bleeding, pharyngeal hematoma as well as other potential complications associated with conscious sedation including aspiration, arrhythmia, respiratory failure and death], benefits (treatment guidance and diagnostic support) and alternatives of a transesophageal echocardiogram were discussed in detail with Collin Bridges and he is willing to proceed.    Disposition: Return if symptoms worsen or fail to  improve.  Medication Adjustments/Labs and Tests Ordered: Current medicines are reviewed at length with the patient today.  Concerns regarding medicines are outlined above.  Orders Placed This Encounter  Procedures   Basic metabolic panel   CBC   TSH   EKG 12-Lead   Meds ordered  this encounter  Medications   icosapent Ethyl (VASCEPA) 1 g capsule    Sig: Take 2 capsules (2 g total) by mouth 2 (two) times daily.    Dispense:  120 capsule    Refill:  2    Patient Instructions  Medication Instructions:  START Vascepa 2 grams twice daily.   *If you need a refill on your cardiac medications before your next appointment, please call your pharmacy*   Lab Work: CBC, BMET, TSH today   If you have labs (blood work) drawn today and your tests are completely normal, you will receive your results only by: [place] (if you have MyChart) OR A paper copy in the mail If you have any lab test that is abnormal or we need to change your treatment, we will call you to review the results.   Testing/Procedures: Your physician has requested that you have a TEE. During a TEE, sound waves are used to create images of your heart. It provides your doctor with information about the size and shape of your heart and how well your hearts chambers and valves are working. In this test, a transducer is attached to the end of a flexible tube thats guided down your throat and into your esophagus (the tube leading from you mouth to your stomach) to get a more detailed image of your heart. You are not awake for the procedure. Please see the instruction sheet given to you today. For further information please visit [email or web address].   Follow-Up: At [facility], you and your health needs are our priority.  As part of our continuing mission to provide you with exceptional heart care, we have created designated Provider Care Teams.  These Care Teams include your primary Cardiologist (physician) and  Advanced Practice Providers (APPs -  Physician Assistants and Nurse Practitioners) who all work together to provide you with the care you need, when you need it.  We recommend signing up for the patient portal called "MyChart".  Sign up information is provided on this After Visit Summary.  MyChart is used to connect with patients for Virtual Visits (Telemedicine).  Patients are able to view lab/test results, encounter notes, upcoming appointments, etc.  Non-urgent messages can be sent to your provider as well.   To learn more about what you can do with MyChart, go to [email or web address].    Your next appointment:   As needed  The format for your next appointment:   In Person  Provider:   Eleonore Chiquito, MD    Other Instruction:  You are scheduled for a TEE on Feb 1st with Dr. Audie Box.  Please arrive at the [facility] (Main Entrance A) at [facility]: [place], [place] [address] at 8 am. (1 hour prior to procedure unless lab work is needed; if lab work is needed arrive 1.5 hours ahead)  DIET: Nothing to eat or drink after midnight except a sip of water with medications (see medication instructions below)  FYI: For your safety, and to allow [place] to monitor your vital signs accurately during the surgery/procedure we request that   if you have artificial nails, gel coating, SNS etc. Please have those removed prior to your surgery/procedure. Not having the nail coverings /polish removed may result in cancellation or delay of your surgery/procedure.  Labs: CBC, BMET, TSH today    You must have a responsible person to drive you home and stay in the waiting area during your procedure. Failure to do so could result  in cancellation.  Bring your insurance cards.  *Special Note: Every effort is made to have your procedure done on time. Occasionally there are emergencies that occur at the hospital that may cause delays. Please be patient if a delay does occur.         Signed, Addison Naegeli. Audie Box, MD, [place]  [address], [place], [place] [address] [phone]  05/24/2021 8:58 AM

## 2021-05-24 ENCOUNTER — Other Ambulatory Visit: Payer: Self-pay | Admitting: Cardiovascular Disease

## 2021-05-24 ENCOUNTER — Ambulatory Visit (INDEPENDENT_AMBULATORY_CARE_PROVIDER_SITE_OTHER): Payer: Commercial Managed Care - HMO | Admitting: Cardiovascular Disease

## 2021-05-24 ENCOUNTER — Other Ambulatory Visit: Payer: Self-pay

## 2021-05-24 ENCOUNTER — Encounter: Payer: Self-pay | Admitting: Cardiovascular Disease

## 2021-05-24 VITALS — BP 140/72 | HR 108 | Ht 72.0 in | Wt 197.8 lb

## 2021-05-24 DIAGNOSIS — E782 Mixed hyperlipidemia: Secondary | ICD-10-CM | POA: Diagnosis not present

## 2021-05-24 DIAGNOSIS — I639 Cerebral infarction, unspecified: Secondary | ICD-10-CM

## 2021-05-24 DIAGNOSIS — I058 Other rheumatic mitral valve diseases: Secondary | ICD-10-CM

## 2021-05-24 LAB — BASIC METABOLIC PANEL
BUN/Creatinine Ratio: 10 (ref 10–24)
BUN: 15 mg/dL (ref 8–27)
CO2: 20 mmol/L (ref 20–29)
Calcium: 8.8 mg/dL (ref 8.6–10.2)
Chloride: 102 mmol/L (ref 96–106)
Creatinine, Ser: 1.43 mg/dL — ABNORMAL HIGH (ref 0.76–1.27)
Glucose: 225 mg/dL — ABNORMAL HIGH (ref 70–99)
Potassium: 3.8 mmol/L (ref 3.5–5.2)
Sodium: 137 mmol/L (ref 134–144)
eGFR: 51 mL/min/{1.73_m2} — ABNORMAL LOW (ref >59)

## 2021-05-24 LAB — CBC
Hematocrit: 40.3 % (ref 37.5–51.0)
Hemoglobin: 13.6 g/dL (ref 13.0–17.7)
MCH: 32 pg (ref 26.6–33.0)
MCHC: 33.7 g/dL (ref 31.5–35.7)
MCV: 95 fL (ref 79–97)
Platelets: 111 10*3/uL — ABNORMAL LOW (ref 150–450)
RBC: 4.25 x10E6/uL (ref 4.14–5.80)
RDW: 12 % (ref 11.6–15.4)
WBC: 6.4 10*3/uL (ref 3.4–10.8)

## 2021-05-24 LAB — TSH: TSH: 1.82 u[IU]/mL (ref 0.450–4.500)

## 2021-05-24 MED ORDER — ICOSAPENT ETHYL 1 G PO CAPS: 2.0000 g | ORAL_CAPSULE | Freq: Two times a day (BID) | ORAL | 2 refills | Status: DC

## 2021-05-24 NOTE — Patient Instructions (Signed)
Medication Instructions:  START Vascepa 2 grams twice daily.   *If you need a refill on your cardiac medications before your next appointment, please call your pharmacy*   Lab Work: CBC, BMET, TSH today   If you have labs (blood work) drawn today and your tests are completely normal, you will receive your results only by: [place] (if you have MyChart) OR A paper copy in the mail If you have any lab test that is abnormal or we need to change your treatment, we will call you to review the results.   Testing/Procedures: Your physician has requested that you have a TEE. During a TEE, sound waves are used to create images of your heart. It provides your doctor with information about the size and shape of your heart and how well your hearts chambers and valves are working. In this test, a transducer is attached to the end of a flexible tube thats guided down your throat and into your esophagus (the tube leading from you mouth to your stomach) to get a more detailed image of your heart. You are not awake for the procedure. Please see the instruction sheet given to you today. For further information please visit [email or web address].   Follow-Up: At [facility], you and your health needs are our priority.  As part of our continuing mission to provide you with exceptional heart care, we have created designated Provider Care Teams.  These Care Teams include your primary Cardiologist (physician) and Advanced Practice Providers (APPs -  Physician Assistants and Nurse Practitioners) who all work together to provide you with the care you need, when you need it.  We recommend signing up for the patient portal called "MyChart".  Sign up information is provided on this After Visit Summary.  MyChart is used to connect with patients for Virtual Visits (Telemedicine).  Patients are able to view lab/test results, encounter notes, upcoming appointments, etc.  Non-urgent messages can be sent to your  provider as well.   To learn more about what you can do with MyChart, go to [email or web address].    Your next appointment:   As needed  The format for your next appointment:   In Person  Provider:   Eleonore Chiquito, MD    Other Instruction:  You are scheduled for a TEE on Feb 1st with Dr. Audie Box.  Please arrive at the [facility] (Main Entrance A) at [facility]: [place], [place] [address] at 8 am. (1 hour prior to procedure unless lab work is needed; if lab work is needed arrive 1.5 hours ahead)  DIET: Nothing to eat or drink after midnight except a sip of water with medications (see medication instructions below)  FYI: For your safety, and to allow [place] to monitor your vital signs accurately during the surgery/procedure we request that   if you have artificial nails, gel coating, SNS etc. Please have those removed prior to your surgery/procedure. Not having the nail coverings /polish removed may result in cancellation or delay of your surgery/procedure.  Labs: CBC, BMET, TSH today    You must have a responsible person to drive you home and stay in the waiting area during your procedure. Failure to do so could result in cancellation.  Bring your insurance cards.  *Special Note: Every effort is made to have your procedure done on time. Occasionally there are emergencies that occur at the hospital that may cause delays. Please be patient if a delay does occur.

## 2021-05-27 ENCOUNTER — Encounter: Payer: Self-pay | Admitting: Internal Medicine

## 2021-05-27 ENCOUNTER — Ambulatory Visit (INDEPENDENT_AMBULATORY_CARE_PROVIDER_SITE_OTHER): Payer: Medicare Other | Admitting: Internal Medicine

## 2021-05-27 ENCOUNTER — Other Ambulatory Visit: Payer: Self-pay

## 2021-05-27 VITALS — BP 160/78 | HR 106 | Temp 98.4°F | Ht 72.0 in | Wt 196.0 lb

## 2021-05-27 DIAGNOSIS — E782 Mixed hyperlipidemia: Secondary | ICD-10-CM

## 2021-05-27 DIAGNOSIS — Z0001 Encounter for general adult medical examination with abnormal findings: Secondary | ICD-10-CM | POA: Diagnosis not present

## 2021-05-27 DIAGNOSIS — I10 Essential (primary) hypertension: Secondary | ICD-10-CM

## 2021-05-27 DIAGNOSIS — E538 Deficiency of other specified B group vitamins: Secondary | ICD-10-CM

## 2021-05-27 DIAGNOSIS — F419 Anxiety disorder, unspecified: Secondary | ICD-10-CM

## 2021-05-27 DIAGNOSIS — N1831 Chronic kidney disease, stage 3a: Secondary | ICD-10-CM | POA: Diagnosis not present

## 2021-05-27 DIAGNOSIS — E559 Vitamin D deficiency, unspecified: Secondary | ICD-10-CM | POA: Diagnosis not present

## 2021-05-27 LAB — LIPID PANEL
Cholesterol: 197 mg/dL (ref 0–200)
HDL: 40 mg/dL (ref >39.00)
NonHDL: 156.62
Total CHOL/HDL Ratio: 5
Triglycerides: 287 mg/dL — ABNORMAL HIGH (ref 0.0–149.0)
VLDL: 57.4 mg/dL — ABNORMAL HIGH (ref 0.0–40.0)

## 2021-05-27 LAB — LDL CHOLESTEROL, DIRECT: Direct LDL: 113 mg/dL

## 2021-05-27 LAB — VITAMIN B12: Vitamin B-12: 755 pg/mL (ref 211–911)

## 2021-05-27 LAB — VITAMIN D 25 HYDROXY (VIT D DEFICIENCY, FRACTURES): VITD: 35.36 ng/mL (ref 30.00–100.00)

## 2021-05-27 LAB — PSA: PSA: 2.17 ng/mL (ref 0.10–4.00)

## 2021-05-27 NOTE — Assessment & Plan Note (Signed)
Lab Results  Component Value Date   LDLCALC UNABLE TO CALCULATE IF TRIGLYCERIDE OVER 400 mg/dL 04/08/2021   Uncontrolled, now on lipitor 40 for the past mont, pt to continue current statin , for f/u lab today

## 2021-05-27 NOTE — Progress Notes (Signed)
Patient ID: Collin Bridges, male   DOB: 1945/09/12, 76 y.o.   MRN: [id number]         Chief Complaint:: wellness exam and Follow-up  Htn, hld, dm, ckd       HPI:  Collin Bridges is a 76 y.o. male here for wellness exam; for eye exam scheduled next month; declines shingrix, ow/ up to date                        Also Pt denies chest pain, increased sob or doe, wheezing, orthopnea, PND, increased LE swelling, palpitations, dizziness or syncope.   Pt denies polydipsia, polyuria, or new focal neuro s/s.   Pt denies fever, wt loss, night sweats, loss of appetite, or other constitutional symptoms  Denies urinary symptoms such as dysuria, frequency, urgency, flank pain, hematuria or n/v, fever, chills.  Denies worsening depressive symptoms, suicidal ideation, or panic, though is nerovus here today.  BP at home has been < 140/90  For TEE soon.  Wt Readings from Last 3 Encounters:  05/27/21 196 lb (88.9 kg)  05/24/21 197 lb 12.8 oz (89.7 kg)  04/16/21 196 lb (88.9 kg)   BP Readings from Last 3 Encounters:  05/27/21 (!) 160/78  05/24/21 140/72  04/16/21 (!) 146/86   Immunization History  Administered Date(s) Administered   DTaP 05/17/2007   Fluad Quad(high Dose 65+) 03/22/2021   Influenza, High Dose Seasonal PF 02/11/2016   Influenza,inj,Quad PF,6+ Mos 08/09/2017   Influenza-Unspecified 05/22/2020   Moderna Sars-Covid-2 Vaccination 09/11/2019, 10/09/2019   Pneumococcal Conjugate-13 07/29/2016   Pneumococcal Polysaccharide-23 05/16/2009, 10/03/2019   Tdap 10/03/2019   There are no preventive care reminders to display for this patient.     Past Medical History:  Diagnosis Date   Allergy    seasonal   Anxiety    Depression    Headache    Hepatitis C    HLD (hyperlipidemia) 08/09/2017   Hypertension    Hypoglycemia    Stroke [facility])    Past Surgical History:  Procedure Laterality Date   APPENDECTOMY     TONSILECTOMY, ADENOIDECTOMY, BILATERAL MYRINGOTOMY AND TUBES       reports that he has quit smoking. He has never used smokeless tobacco. He reports that he does not drink alcohol and does not use drugs. family history includes Hyperlipidemia in his father; Hypertension in his father; Lung disease in his mother. No Active Allergies Current Outpatient Medications on File Prior to Visit  Medication Sig Dispense Refill   Aspirin 81 MG CAPS Take 81 mg by mouth daily. 90 capsule 2   atorvastatin (LIPITOR) 40 MG tablet Take 1 tablet (40 mg total) by mouth daily. 30 tablet 2   clopidogrel (PLAVIX) 75 MG tablet Take 1 tablet (75 mg total) by mouth daily. 30 tablet 2   fluticasone (FLONASE) 50 MCG/ACT nasal spray USE 2 SPRAYS IN EACH NOSTRIL DAILY (Patient taking differently: 2 sprays daily.) 48 g 3   ibuprofen (ADVIL) 200 MG tablet Take 200 mg by mouth every 6 (six) hours as needed for mild pain.     icosapent Ethyl (VASCEPA) 1 g capsule Take 2 capsules (2 g total) by mouth 2 (two) times daily. 120 capsule 2   loratadine (CLARITIN) 10 MG tablet Take 1 tablet (10 mg total) by mouth daily. 90 tablet 2   losartan (COZAAR) 100 MG tablet Take 1 tablet (100 mg total) by mouth daily. 90 tablet 3   PARoxetine (PAXIL) 40 MG  tablet Take 1 tablet (40 mg total) by mouth every morning. 90 tablet 2   QUEtiapine (SEROQUEL) 50 MG tablet Take 1 tablet (50 mg total) by mouth 2 (two) times daily. 180 tablet 3   zolpidem (AMBIEN) 10 MG tablet TAKE 1 TABLET(10 MG) BY MOUTH AT BEDTIME AS NEEDED FOR SLEEP (Patient taking differently: Take 5-10 mg by mouth at bedtime as needed for sleep.) 90 tablet 1   No current facility-administered medications on file prior to visit.        ROS:  All others reviewed and negative.  Objective        PE:  BP (!) 160/78 (BP Location: Right Arm, Patient Position: Sitting, Cuff Size: Normal)    Pulse (!) 106    Temp 98.4 F (36.9 C) (Oral)    Ht 6' (1.829 m)    Wt 196 lb (88.9 kg)    SpO2 96%    BMI 26.58 kg/m                 Constitutional: Pt  appears in NAD               HENT: Head: NCAT.                Right Ear: External ear normal.                 Left Ear: External ear normal.                Eyes: . Pupils are equal, round, and reactive to light. Conjunctivae and EOM are normal               Nose: without d/c or deformity               Neck: Neck supple. Gross normal ROM               Cardiovascular: Normal rate and regular rhythm.                 Pulmonary/Chest: Effort normal and breath sounds without rales or wheezing.                Abd:  Soft, NT, ND, + BS, no organomegaly               Neurological: Pt is alert. At baseline orientation, motor grossly intact               Skin: Skin is warm. No rashes, no other new lesions, LE edema - none               Psychiatric: Pt behavior is normal without agitation   Micro: none  Cardiac tracings I have personally interpreted today:  none  Pertinent Radiological findings (summarize): none   Lab Results  Component Value Date   WBC 6.4 05/24/2021   HGB 13.6 05/24/2021   HCT 40.3 05/24/2021   PLT 111 (L) 05/24/2021   GLUCOSE 225 (H) 05/24/2021   CHOL 197 05/27/2021   TRIG 287.0 (H) 05/27/2021   HDL 40.00 05/27/2021   LDLDIRECT 113.0 05/27/2021   LDLCALC UNABLE TO CALCULATE IF TRIGLYCERIDE OVER 400 mg/dL 04/08/2021   ALT 22 04/07/2021   AST 35 04/07/2021   NA 137 05/24/2021   K 3.8 05/24/2021   CL 102 05/24/2021   CREATININE 1.43 (H) 05/24/2021   BUN 15 05/24/2021   CO2 20 05/24/2021   TSH 1.820 05/24/2021   PSA 2.17 05/27/2021   INR 1.0 04/07/2021  HGBA1C 5.8 (H) 04/08/2021   MICROALBUR 2.8 (H) 05/22/2020   Assessment/Plan:  Collin Bridges is a 76 y.o. White or Caucasian [1] male with  has a past medical history of Allergy, Anxiety, Depression, Headache, Hepatitis C, HLD (hyperlipidemia) (08/09/2017), Hypertension, Hypoglycemia, and Stroke ([name]).  HLD (hyperlipidemia) Lab Results  Component Value Date   LDLCALC UNABLE TO CALCULATE IF TRIGLYCERIDE  OVER 400 mg/dL 04/08/2021   Uncontrolled, now on lipitor 40 for the past mont, pt to continue current statin , for f/u lab today   Encounter for well adult exam with abnormal findings Age and sex appropriate education and counseling updated with regular exercise and diet Referrals for preventative services - none needed Immunizations addressed - declines shingrix Smoking counseling  - none needed Evidence for depression or other mood disorder - chronic anxiety no change Most recent labs reviewed. I have personally reviewed and have noted: 1) the patient's medical and social history 2) The patient's current medications and supplements 3) The patient's height, weight, and BMI have been recorded in the chart   Anxiety disorder Stable overall, cont paxil, seroquel  CKD (chronic kidney disease) stage 3, GFR 30-59 ml/min (HCC) Lab Results  Component Value Date   CREATININE 1.43 (H) 05/24/2021   Stable overall, cont to avoid nephrotoxins   Essential hypertension BP Readings from Last 3 Encounters:  05/27/21 (!) 160/78  05/24/21 140/72  04/16/21 (!) 146/86   Uncontrolled today, pt to continue medical treatment losartan   Vitamin D deficiency Last vitamin D Lab Results  Component Value Date   VD25OH 35.36 05/27/2021   Low, to start oral replacement  Followup: Return in about 6 months (around 11/24/2021).  [name], MD 06/03/2021 4:52 AM [facility]

## 2021-05-27 NOTE — Patient Instructions (Signed)

## 2021-06-03 ENCOUNTER — Encounter: Payer: Self-pay | Admitting: Internal Medicine

## 2021-06-03 DIAGNOSIS — E559 Vitamin D deficiency, unspecified: Secondary | ICD-10-CM | POA: Insufficient documentation

## 2021-06-03 NOTE — Assessment & Plan Note (Signed)
Stable overall, cont paxil, seroquel

## 2021-06-03 NOTE — Assessment & Plan Note (Signed)
Age and sex appropriate education and counseling updated with regular exercise and diet Referrals for preventative services - none needed Immunizations addressed - declines shingrix Smoking counseling  - none needed Evidence for depression or other mood disorder - chronic anxiety no change Most recent labs reviewed. I have personally reviewed and have noted: 1) the patient's medical and social history 2) The patient's current medications and supplements 3) The patient's height, weight, and BMI have been recorded in the chart

## 2021-06-03 NOTE — Assessment & Plan Note (Signed)
Lab Results  Component Value Date   CREATININE 1.43 (H) 05/24/2021   Stable overall, cont to avoid nephrotoxins

## 2021-06-03 NOTE — Assessment & Plan Note (Signed)
BP Readings from Last 3 Encounters:  05/27/21 (!) 160/78  05/24/21 140/72  04/16/21 (!) 146/86   Uncontrolled today, pt to continue medical treatment losartan

## 2021-06-03 NOTE — Assessment & Plan Note (Signed)
Last vitamin D Lab Results  Component Value Date   VD25OH 35.36 05/27/2021   Low, to start oral replacement  

## 2021-06-08 ENCOUNTER — Encounter (HOSPITAL_COMMUNITY): Payer: Self-pay | Admitting: Cardiovascular Disease

## 2021-06-08 NOTE — Progress Notes (Signed)
Attempted to obtain medical history via telephone, unable to reach at this time. I left a voicemail to return pre surgical testing department's phone call.  

## 2021-06-16 ENCOUNTER — Ambulatory Visit (HOSPITAL_COMMUNITY): Payer: Medicare Other | Admitting: Anesthesiology

## 2021-06-16 ENCOUNTER — Encounter (HOSPITAL_COMMUNITY): Payer: Self-pay | Admitting: Cardiovascular Disease

## 2021-06-16 ENCOUNTER — Encounter (HOSPITAL_COMMUNITY)
Admission: RE | Disposition: A | Discharge status: Home / Self Care | Payer: Self-pay | Source: Home / Self Care | Attending: Cardiovascular Disease

## 2021-06-16 ENCOUNTER — Ambulatory Visit (HOSPITAL_COMMUNITY)
Admission: RE | Admit: 2021-06-16 | Discharge: 2021-06-16 | Disposition: A | Discharge status: Home / Self Care | Payer: Medicare Other | Attending: Cardiovascular Disease | Admitting: Cardiovascular Disease

## 2021-06-16 ENCOUNTER — Ambulatory Visit (HOSPITAL_BASED_OUTPATIENT_CLINIC_OR_DEPARTMENT_OTHER)
Admission: RE | Admit: 2021-06-16 | Discharge: 2021-06-16 | Disposition: A | Discharge status: Home / Self Care | Payer: Medicare Other | Source: Ambulatory Visit | Attending: Cardiovascular Disease | Admitting: Cardiovascular Disease

## 2021-06-16 DIAGNOSIS — Z7982 Long term (current) use of aspirin: Secondary | ICD-10-CM | POA: Diagnosis not present

## 2021-06-16 DIAGNOSIS — Z7902 Long term (current) use of antithrombotics/antiplatelets: Secondary | ICD-10-CM | POA: Diagnosis not present

## 2021-06-16 DIAGNOSIS — E782 Mixed hyperlipidemia: Secondary | ICD-10-CM | POA: Diagnosis not present

## 2021-06-16 DIAGNOSIS — I129 Hypertensive chronic kidney disease with stage 1 through stage 4 chronic kidney disease, or unspecified chronic kidney disease: Secondary | ICD-10-CM | POA: Diagnosis not present

## 2021-06-16 DIAGNOSIS — I340 Nonrheumatic mitral (valve) insufficiency: Secondary | ICD-10-CM | POA: Diagnosis not present

## 2021-06-16 DIAGNOSIS — I639 Cerebral infarction, unspecified: Secondary | ICD-10-CM | POA: Insufficient documentation

## 2021-06-16 DIAGNOSIS — I350 Nonrheumatic aortic (valve) stenosis: Secondary | ICD-10-CM

## 2021-06-16 DIAGNOSIS — N183 Chronic kidney disease, stage 3 unspecified: Secondary | ICD-10-CM | POA: Diagnosis not present

## 2021-06-16 DIAGNOSIS — I700 Atherosclerosis of aorta: Secondary | ICD-10-CM | POA: Diagnosis not present

## 2021-06-16 DIAGNOSIS — Z87891 Personal history of nicotine dependence: Secondary | ICD-10-CM | POA: Diagnosis not present

## 2021-06-16 HISTORY — PX: TEE WITHOUT CARDIOVERSION: SHX5443

## 2021-06-16 HISTORY — PX: BUBBLE STUDY: SHX6837

## 2021-06-16 LAB — ECHO TEE
AR max vel: 1.87 cm2
AV Area VTI: 1.87 cm2
AV Area mean vel: 1.65 cm2
AV Mean grad: 14 mmHg
AV Peak grad: 22.1 mmHg
Ao pk vel: 2.35 m/s

## 2021-06-16 SURGERY — ECHOCARDIOGRAM, TRANSESOPHAGEAL
Anesthesia: Monitor Anesthesia Care

## 2021-06-16 MED ORDER — PROPOFOL 10 MG/ML IV BOLUS
INTRAVENOUS | Status: DC | PRN
  Administered 2021-06-16: 20 mg via INTRAVENOUS
  Administered 2021-06-16: 30 mg via INTRAVENOUS
  Administered 2021-06-16: 40 mg via INTRAVENOUS

## 2021-06-16 MED ORDER — SODIUM CHLORIDE 0.9 % IV SOLN: INTRAVENOUS | Status: DC

## 2021-06-16 MED ORDER — PROPOFOL 500 MG/50ML IV EMUL
INTRAVENOUS | Status: DC | PRN
  Administered 2021-06-16: 100 ug/kg/min via INTRAVENOUS

## 2021-06-16 MED ORDER — PHENYLEPHRINE 40 MCG/ML (10ML) SYRINGE FOR IV PUSH (FOR BLOOD PRESSURE SUPPORT)
PREFILLED_SYRINGE | INTRAVENOUS | Status: DC | PRN
  Administered 2021-06-16: 80 ug via INTRAVENOUS
  Administered 2021-06-16: 40 ug via INTRAVENOUS
  Administered 2021-06-16 (×2): 80 ug via INTRAVENOUS
  Administered 2021-06-16: 40 ug via INTRAVENOUS

## 2021-06-16 NOTE — Anesthesia Preprocedure Evaluation (Signed)
Anesthesia Evaluation  Patient identified by MRN, date of birth, ID band Patient awake    Reviewed: Allergy & Precautions, NPO status , Patient's Chart, lab work & pertinent test results  Airway Mallampati: III  TM Distance: >3 FB Neck ROM: Full    Dental  (+) Edentulous Upper, Missing, Poor Dentition, Dental Advisory Given,    Pulmonary former smoker,    Pulmonary exam normal breath sounds clear to auscultation       Cardiovascular hypertension (poorly controlled, 185/99 in preop), Pt. on medications +CHF (grade 1 diastolic dysfunction)  Normal cardiovascular exam+ Valvular Problems/Murmurs (mild AS, MV mass) AS  Rhythm:Regular Rate:Normal  Last echo 03/2021: 1. There is a mobile, sub-mitral valvular echogenic mass, which is  adjacent to the LVOT. This likely represents calcified redundant MV chord  (benign), however in the setting of stroke, consider TEE to further  evaluate. The mitral valve is abnormal.  Trivial mitral valve regurgitation. No evidence of mitral stenosis.  2. Left ventricular ejection fraction, by estimation, is 60 to 65%. The  left ventricle has normal function. The left ventricle has no regional  wall motion abnormalities. Left ventricular diastolic parameters are  consistent with Grade I diastolic  dysfunction (impaired relaxation).  3. Right ventricular systolic function is normal. The right ventricular  size is normal. Tricuspid regurgitation signal is inadequate for assessing  PA pressure.  4. The aortic valve is abnormal. There is moderate calcification of the  aortic valve. Aortic valve regurgitation is not visualized. Mild aortic  valve stenosis. Aortic valve area, by VTI measures 1.80 cm. Aortic valve  mean gradient measures 10.0 mmHg.  Aortic valve Vmax measures 2.02 m/s.  5. The inferior vena cava is normal in size with greater than 50%  respiratory variability, suggesting right atrial  pressure of 3 mmHg.    Neuro/Psych  Headaches, PSYCHIATRIC DISORDERS Anxiety Depression  CT scan performed in October 2022 after a fall.  Findings were concerning for possible meningioma.  He underwent outpatient brain MRI that showed subacute bilateral cerebellar infarcts.  He did have a meningioma found without mass-effect.  There was severe small vessel ischemic disease noted.  He underwent an echocardiogram when he was admitted to the hospital.  This is concerning for a mitral valve mass.  It appears to be calcified subchordal tissue.  He presents for follow-up today for transesophageal echo and further work-up. CVA (aspirin, plavix), No Residual Symptoms    GI/Hepatic negative GI ROS, (+) Hepatitis -, C  Endo/Other  negative endocrine ROS  Renal/GU CRFRenal diseaseCKD Cr 1.43  negative genitourinary   Musculoskeletal negative musculoskeletal ROS (+)   Abdominal   Peds negative pediatric ROS (+)  Hematology negative hematology ROS (+)   Anesthesia Other Findings   Reproductive/Obstetrics negative OB ROS                             Anesthesia Physical Anesthesia Plan  ASA: 3  Anesthesia Plan: MAC   Post-op Pain Management:    Induction:   PONV Risk Score and Plan: 2 and Propofol infusion and TIVA  Airway Management Planned: Natural Airway and Simple Face Mask  Additional Equipment: None  Intra-op Plan:   Post-operative Plan:   Informed Consent: I have reviewed the patients History and Physical, chart, labs and discussed the procedure including the risks, benefits and alternatives for the proposed anesthesia with the patient or authorized representative who has indicated his/her understanding and acceptance.  Plan Discussed with: CRNA  Anesthesia Plan Comments:         Anesthesia Quick Evaluation

## 2021-06-16 NOTE — Progress Notes (Deleted)
°  Echocardiogram 2D Echocardiogram has been performed.  Fidel Levy 06/16/2021, 10:49 AM

## 2021-06-16 NOTE — Anesthesia Postprocedure Evaluation (Signed)
Anesthesia Post Note  Patient: Pinchus Weckwerth [facility]  Procedure(s) Performed: TRANSESOPHAGEAL ECHOCARDIOGRAM (TEE) BUBBLE STUDY     Patient location during evaluation: PACU Anesthesia Type: MAC Level of consciousness: awake and alert Pain management: pain level controlled Vital Signs Assessment: post-procedure vital signs reviewed and stable Respiratory status: spontaneous breathing, nonlabored ventilation and respiratory function stable Cardiovascular status: blood pressure returned to baseline and stable Postop Assessment: no apparent nausea or vomiting Anesthetic complications: no   No notable events documented.  Last Vitals:  Vitals:   06/16/21 0945 06/16/21 1000  BP: (!) 145/91 (!) 151/82  Pulse: 89 87  Resp: (!) 23 (!) 25  Temp:  37 C  SpO2: 95% 96%    Last Pain:  Vitals:   06/16/21 1000  TempSrc:   PainSc: 0-No pain                 Pervis Hocking

## 2021-06-16 NOTE — CV Procedure (Signed)
° ° °  TRANSESOPHAGEAL ECHOCARDIOGRAM   NAME:  Collin Bridges    MRN: [id number] DOB:  25-Apr-1946    ADMIT DATE: 06/16/2021  INDICATIONS: Stroke  PROCEDURE:   Informed consent was obtained prior to the procedure. The risks, benefits and alternatives for the procedure were discussed and the patient comprehended these risks.  Risks include, but are not limited to, cough, sore throat, vomiting, nausea, somnolence, esophageal and stomach trauma or perforation, bleeding, low blood pressure, aspiration, pneumonia, infection, trauma to the teeth and death.    Procedural time out performed. The oropharynx was anesthetized with topical 1% benzocaine.    Anesthesia was administered by Dr. Doroteo Glassman.  The patient was administered 200 mg of propofol and 0 mg of lidocaine to achieve and maintain moderate conscious sedation.  The patient's heart rate, blood pressure, and oxygen saturation are monitored continuously during the procedure. The period of conscious sedation is 16 minutes, of which I was present face-to-face 100% of this time.   The transesophageal probe was inserted in the esophagus and stomach without difficulty and multiple views were obtained.   COMPLICATIONS:    There were no immediate complications.  KEY FINDINGS:  Calcified mitral chordal tissue present. Benign.  Negative bubble study.  No LAA thrombus.  Mild AS. Normal LV/RV function.  Full report to follow. Further management per primary team.   [name], MD, [place]  [address] [place], [place] [address] [phone]  9:21 AM

## 2021-06-16 NOTE — Progress Notes (Signed)
°  Echocardiogram Echocardiogram Transesophageal has been performed.  Fidel Levy 06/16/2021, 10:51 AM

## 2021-06-16 NOTE — Transfer of Care (Signed)
Immediate Anesthesia Transfer of Care Note  Patient: Muriel Wilber [facility]  Procedure(s) Performed: TRANSESOPHAGEAL ECHOCARDIOGRAM (TEE) BUBBLE STUDY  Patient Location: PACU  Anesthesia Type:MAC  Level of Consciousness: drowsy  Airway & Oxygen Therapy: Patient Spontanous Breathing  Post-op Assessment: Report given to RN and Post -op Vital signs reviewed and stable  Post vital signs: Reviewed and stable  Last Vitals:  Vitals Value Taken Time  BP 134/83 06/16/21 0929  Temp    Pulse 88 06/16/21 0929  Resp 22 06/16/21 0929  SpO2 95 % 06/16/21 0929  Vitals shown include unvalidated device data.  Last Pain:  Vitals:   06/16/21 0800  TempSrc: Temporal  PainSc: 0-No pain         Complications: No notable events documented.

## 2021-06-16 NOTE — Interval H&P Note (Signed)
History and Physical Interval Note:  06/16/2021 8:03 AM  Collin Bridges  has presented today for surgery, with the diagnosis of STROKE.  The various methods of treatment have been discussed with the patient and family. After consideration of risks, benefits and other options for treatment, the patient has consented to  Procedure(s): TRANSESOPHAGEAL ECHOCARDIOGRAM (TEE) (N/A) as a surgical intervention.  The patient's history has been reviewed, patient examined, no change in status, stable for surgery.  I have reviewed the patient's chart and labs.  Questions were answered to the patient's satisfaction.    NPO for TEE for stroke. Redundant MV tissue seen on echo.   [name], MD, [place]  [address] [place], [place] [address] [phone]  8:04 AM

## 2021-06-18 ENCOUNTER — Encounter (HOSPITAL_COMMUNITY): Payer: Self-pay | Admitting: Cardiovascular Disease

## 2021-06-21 ENCOUNTER — Other Ambulatory Visit: Payer: Self-pay

## 2021-06-21 ENCOUNTER — Encounter: Payer: Self-pay | Admitting: Internal Medicine

## 2021-06-21 MED ORDER — ATORVASTATIN CALCIUM 40 MG PO TABS: 40.0000 mg | ORAL_TABLET | Freq: Every day | ORAL | 2 refills | Status: DC

## 2021-06-24 ENCOUNTER — Other Ambulatory Visit: Payer: Self-pay | Admitting: Internal Medicine

## 2021-06-24 NOTE — Telephone Encounter (Signed)
Please refill as per office routine med refill policy (all routine meds to be refilled for 3 mo or monthly (per pt preference) up to one year from last visit, then month to month grace period for 3 mo, then further med refills will have to be denied) ? ?

## 2021-07-04 ENCOUNTER — Telehealth: Payer: Self-pay | Admitting: Neurology

## 2021-07-04 NOTE — Telephone Encounter (Signed)
Dr. Leonie Man will be out of the office tomorrow, so I sent the patient a mychart message asking him to call the office tomorrow to get rescheduled.

## 2021-07-05 ENCOUNTER — Inpatient Hospital Stay: Payer: Medicare Other | Admitting: Neurology

## 2021-07-19 ENCOUNTER — Other Ambulatory Visit: Payer: Self-pay | Admitting: Internal Medicine

## 2021-07-19 NOTE — Telephone Encounter (Signed)
Please refill as per office routine med refill policy (all routine meds to be refilled for 3 mo or monthly (per pt preference) up to one year from last visit, then month to month grace period for 3 mo, then further med refills will have to be denied) ? ?

## 2021-07-22 ENCOUNTER — Encounter: Payer: Self-pay | Admitting: Neurology

## 2021-07-22 ENCOUNTER — Ambulatory Visit (INDEPENDENT_AMBULATORY_CARE_PROVIDER_SITE_OTHER): Payer: Medicare Other | Admitting: Neurology

## 2021-07-22 VITALS — BP 167/92 | HR 62 | Ht 72.0 in | Wt 201.2 lb

## 2021-07-22 DIAGNOSIS — I6930 Unspecified sequelae of cerebral infarction: Secondary | ICD-10-CM

## 2021-07-22 DIAGNOSIS — R269 Unspecified abnormalities of gait and mobility: Secondary | ICD-10-CM

## 2021-07-22 NOTE — Progress Notes (Signed)
[facility] [address] [place]. [place] [address] [phone]       OFFICE FOLLOW-UP NOTE  [name] Date of Birth:  1946/01/27 Medical Record Number:  [id number]   HPI: [name] is a pleasant 76 year old Caucasian male initial office follow-up visit following initial hospital consultation visit for stroke in November 2022.  History is obtained from the patient and review of electronic medical records.  I have also personally reviewed available pertinent imaging films in  PACS.Collin Bridges is a 76 y.o. male with a medical history significant for anxiety, depression, hepatitis C, chronic kidney disease stage IIIa, hyperlipidemia, essential hypertension who presented to the ED 11/23 after being sent by his primary care provider due to abnormal MRI report results.  Patient states that he fell on March 10, 2021 in the shower after he slipped sustaining a laceration requiring sutures to the right frontal scalp.  He states he his neck paralyzed from neck down for couple of hours and could not move call for help for a few hours but fortunately recovered.At that time CT imaging revealed a small meningioma below the anterior aspect of the tentorium on the left and was discharged with recommendation for MRI follow-up.  Patient had MRI brain outpatient on 11/19 with results on 11/22 revealing subcentimeter subacute bilateral cerebral infarcts with a 12 mm mass along the left tentorium compatible with a meningioma without mass-effect or edema.  MRI imaging also revealed severe chronic small vessel ischemic disease and patient was asked by his primary provider to come to the ED for further evaluation and work-up of suspected stroke. At baseline patient lives independently, states he does not have any living family members and he does not drive.  He is able to complete his ADLs independently and walks without a walker.  Patient states that he has been on 325 mg of  aspirin for approximately 30 years.  He also endorses blurry vision after falling that resolved after 5 days.  MRI scan of the brain showed tiny suspected subcentimeter cerebellar, left parietal and right frontal subcortical white matter acute infarcts.  12 mm left tentorium likely meningioma.  There was severe changes of chronic small vessel disease.  MRA of the neck was unremarkable.  MRA of the brain with degenerative right posterior cerebral artery aplastic versus occluded A1 segment of the right.  2D echo showed ejection fraction 60 to 65% with suspected calcific beneath the  mitral valve.  LDL cholesterol was 110 mg percent and A1c was 5.8.  Patient was started on dual antiplatelet therapy aspirin and Plavix tolerating well with only minor bruising.   He has not had any recurrent TIA or stroke symptoms.  His blood pressure is under good control.  He is tolerating Lipitor well without muscle aches and pains.  He had outpatient TEE on 06/16/2021 which confirmed coronary tissue findings.  There was grade 3 aortic.  Arch plaque noted.  No clot or PFO seen.   ROS:   14 system review of systems is positive for gait imbalance, falls, bruising, dizziness and all other systems negative  PMH:  Past Medical History:  Diagnosis Date   Allergy    seasonal   Anxiety    Depression    Headache    Hepatitis C    HLD (hyperlipidemia) 08/09/2017   Hypertension    Hypoglycemia    Stroke [facility])     Social History:  Social History   Socioeconomic History   Marital status: Single  Spouse name: Not on file   Number of children: 0   Years of education: some college   Highest education level: Not on file  Occupational History   Not on file  Tobacco Use   Smoking status: Former   Smokeless tobacco: Never   Tobacco comments:    quit 86yr ago, smoke a cigar occassionally.  Substance and Sexual Activity   Alcohol use: No   Drug use: No   Sexual activity: Yes  Other Topics Concern   Not on file   Social History Narrative   Lives alone.   Right-handed.   Two cups caffeine per day.   Social Determinants of Health   Financial Resource Strain: Not on file  Food Insecurity: Not on file  Transportation Needs: Not on file  Physical Activity: Not on file  Stress: Not on file  Social Connections: Not on file  Intimate Partner Violence: Not on file    Medications:   Current Outpatient Medications on File Prior to Visit  Medication Sig Dispense Refill   atorvastatin (LIPITOR) 40 MG tablet Take 1 tablet (40 mg total) by mouth daily. 30 tablet 2   cholecalciferol (VITAMIN D3) 25 MCG (1000 UNIT) tablet Take 1,000 Units by mouth daily.     clopidogrel (PLAVIX) 75 MG tablet Take 1 tablet (75 mg total) by mouth daily. 30 tablet 2   fluticasone (FLONASE) 50 MCG/ACT nasal spray USE 2 SPRAYS IN EACH NOSTRIL DAILY 48 g 3   ibuprofen (ADVIL) 200 MG tablet Take 200 mg by mouth every 6 (six) hours as needed for mild pain.     icosapent Ethyl (VASCEPA) 1 g capsule Take 2 capsules (2 g total) by mouth 2 (two) times daily. 120 capsule 2   loratadine (CLARITIN) 10 MG tablet Take 1 tablet (10 mg total) by mouth daily. 90 tablet 2   losartan (COZAAR) 100 MG tablet Take 1 tablet (100 mg total) by mouth daily. 90 tablet 3   Multiple Vitamins-Minerals (HAIR/SKIN/NAILS/BIOTIN PO) Take 1 tablet by mouth daily.     PARoxetine (PAXIL) 40 MG tablet Take 1 tablet (40 mg total) by mouth every morning. 90 tablet 2   QUEtiapine (SEROQUEL) 50 MG tablet Take 1 tablet (50 mg total) by mouth 2 (two) times daily. 180 tablet 3   vitamin E 180 MG (400 UNITS) capsule Take 400 Units by mouth daily.     zolpidem (AMBIEN) 10 MG tablet TAKE 1 TABLET(10 MG) BY MOUTH AT BEDTIME AS NEEDED FOR SLEEP 90 tablet 1   No current facility-administered medications on file prior to visit.    Allergies:  No Known Allergies  Physical Exam General: well developed, well nourished, pleasant elderly Caucasian male seated, in no evident  distress Head: head normocephalic and atraumatic.  Neck: supple with no carotid or supraclavicular bruits Cardiovascular: regular rate and rhythm, no murmurs Musculoskeletal: no deformity Skin:  no rash/petichiae Vascular:  Normal pulses all extremities Vitals:   07/22/21 0723  BP: (!) 167/92  Pulse: 62   Neurologic Exam Mental Status: Awake and fully alert. Oriented to place and time. Recent and remote memory intact. Attention span, concentration and fund of knowledge appropriate. Mood and affect appropriate.  Cranial Nerves: Fundoscopic exam reveals sharp disc margins. Pupils equal, briskly reactive to light. Extraocular movements full without nystagmus. Visual fields full to confrontation. Hearing intact. Facial sensation intact. Face, tongue, palate moves normally and symmetrically.  Motor: Normal bulk and tone. Normal strength in all tested extremity muscles. Sensory.: intact to touch ,  pinprick .position and vibratory sensation.  Coordination: Rapid alternating movements normal in all extremities. Finger-to-nose and heel-to-shin performed accurately bilaterally. Gait and Station: Arises from chair without difficulty. Stance is normal. Gait demonstrates normal stride length and balance . Able to heel, toe and tandem walk with moderate difficulty.  Reflexes: 1+ and symmetric. Toes downgoing.   NIHSS  0 Modified Rankin  1   ASSESSMENT: 76 year old Caucasian male with bicerebral tiny subcortical silent lacunar stroke from small vessel disease.  Vascular risk factors of hypertension and hyperlipidemia.     PLAN:I had a long d/w patient about his recent stroke, risk for recurrent stroke/TIAs, personally independently reviewed imaging studies and stroke evaluation results and answered questions.Continue Plavix 75 mg daily alone and start aspirin for secondary stroke prevention and maintain strict control of hypertension with blood pressure goal below 130/90, diabetes with hemoglobin A1c  goal below 6.5% and lipids with LDL cholesterol goal below 70 mg/dL. I also advised the patient to eat a healthy diet with plenty of whole grains, cereals, fruits and vegetables, exercise regularly and maintain ideal body weight . Plan to repeat MRI scan of the brain with and without contrast at next follow-up visit for  meningioma surveillance .  Followup in the future with me in 6 months for follow-up. Greater than 50% of time during this 35 minute visit was spent on counseling,explanation of diagnosis, planning of further management, discussion with patient and family and coordination of care Antony Contras, MD Note: This document was prepared with digital dictation and possible smart phrase technology. Any transcriptional errors that result from this process are unintentional

## 2021-07-22 NOTE — Patient Instructions (Addendum)
I had a long d/w patient about his recent stroke, risk for recurrent stroke/TIAs, personally independently reviewed imaging studies and stroke evaluation results and answered questions.Continue Plavix 75 mg daily alone and start aspirin for secondary stroke prevention and maintain strict control of hypertension with blood pressure goal below 130/90, diabetes with hemoglobin A1c goal below 6.5% and lipids with LDL cholesterol goal below 70 mg/dL. I also advised the patient to eat a healthy diet with plenty of whole grains, cereals, fruits and vegetables, exercise regularly and maintain ideal body weight . Plan to repeat MRI scan of the brain with and without contrast at next follow-up visit for  meningioma surveillance .  Followup in the future with me in 6 months for follow-up.  Stroke Prevention Some medical conditions and behaviors can lead to a higher chance of having a stroke. You can help prevent a stroke by eating healthy, exercising, not smoking, and managing any medical conditions you have. Stroke is a leading cause of functional impairment. Primary prevention is particularly important because a majority of strokes are first-time events. Stroke changes the lives of not only those who experience a stroke but also their family and other caregivers. How can this condition affect me? A stroke is a medical emergency and should be treated right away. A stroke can lead to brain damage and can sometimes be life-threatening. If a person gets medical treatment right away, there is a better chance of surviving and recovering from a stroke. What can increase my risk? The following medical conditions may increase your risk of a stroke: Cardiovascular disease. High blood pressure (hypertension). Diabetes. High cholesterol. Sickle cell disease. Blood clotting disorders (hypercoagulable state). Obesity. Sleep disorders (obstructive sleep apnea). Other risk factors include: Being older than age  45. Having a history of blood clots, stroke, or mini-stroke (transient ischemic attack, TIA). Genetic factors, such as race, ethnicity, or a family history of stroke. Smoking cigarettes or using other tobacco products. Taking birth control pills, especially if you also use tobacco. Heavy use of alcohol or drugs, especially cocaine and methamphetamine. Physical inactivity. What actions can I take to prevent this? Manage your health conditions High cholesterol levels. Eating a healthy diet is important for preventing high cholesterol. If cholesterol cannot be managed through diet alone, you may need to take medicines. Take any prescribed medicines to control your cholesterol as told by your health care provider. Hypertension. To reduce your risk of stroke, try to keep your blood pressure below 130/80. Eating a healthy diet and exercising regularly are important for controlling blood pressure. If these steps are not enough to manage your blood pressure, you may need to take medicines. Take any prescribed medicines to control hypertension as told by your health care provider. Ask your health care provider if you should monitor your blood pressure at home. Have your blood pressure checked every year, even if your blood pressure is normal. Blood pressure increases with age and some medical conditions. Diabetes. Eating a healthy diet and exercising regularly are important parts of managing your blood sugar (glucose). If your blood sugar cannot be managed through diet and exercise, you may need to take medicines. Take any prescribed medicines to control your diabetes as told by your health care provider. Get evaluated for obstructive sleep apnea. Talk to your health care provider about getting a sleep evaluation if you snore a lot or have excessive sleepiness. Make sure that any other medical conditions you have, such as atrial fibrillation or atherosclerosis, are managed. Nutrition Follow  instructions from your health care provider about what to eat or drink to help manage your health condition. These instructions may include: Reducing your daily calorie intake. Limiting how much salt (sodium) you use to 1,500 milligrams (mg) each day. Using only healthy fats for cooking, such as olive oil, canola oil, or sunflower oil. Eating healthy foods. You can do this by: Choosing foods that are high in fiber, such as whole grains, and fresh fruits and vegetables. Eating at least 5 servings of fruits and vegetables a day. Try to fill one-half of your plate with fruits and vegetables at each meal. Choosing lean protein foods, such as lean cuts of meat, poultry without skin, fish, tofu, beans, and nuts. Eating low-fat dairy products. Avoiding foods that are high in sodium. This can help lower blood pressure. Avoiding foods that have saturated fat, trans fat, and cholesterol. This can help prevent high cholesterol. Avoiding processed and prepared foods. Counting your daily carbohydrate intake.  Lifestyle If you drink alcohol: Limit how much you have to: 0-1 drink a day for women who are not pregnant. 0-2 drinks a day for men. Know how much alcohol is in your drink. In the [place] one drink equals one 12 oz bottle of beer (386m), one 5 oz glass of wine (1415m, or one 1 oz glass of hard liquor (4457m Do not use any products that contain nicotine or tobacco. These products include cigarettes, chewing tobacco, and vaping devices, such as e-cigarettes. If you need help quitting, ask your health care provider. Avoid secondhand smoke. Do not use drugs. Activity  Try to stay at a healthy weight. Get at least 30 minutes of exercise on most days, such as: Fast walking. Biking. Swimming. Medicines Take over-the-counter and prescription medicines only as told by your health care provider. Aspirin or blood thinners (antiplatelets or anticoagulants) may be recommended to reduce your risk of  forming blood clots that can lead to stroke. Avoid taking birth control pills. Talk to your health care provider about the risks of taking birth control pills if: You are over 35 79ars old. You smoke. You get very bad headaches. You have had a blood clot. Where to find more information American Stroke Association: www.strokeassociation.org Get help right away if: You or a loved one has any symptoms of a stroke. "BE FAST" is an easy way to remember the main warning signs of a stroke: B - Balance. Signs are dizziness, sudden trouble walking, or loss of balance. E - Eyes. Signs are trouble seeing or a sudden change in vision. F - Face. Signs are sudden weakness or numbness of the face, or the face or eyelid drooping on one side. A - Arms. Signs are weakness or numbness in an arm. This happens suddenly and usually on one side of the body. S - Speech. Signs are sudden trouble speaking, slurred speech, or trouble understanding what people say. T - Time. Time to call emergency services. Write down what time symptoms started. You or a loved one has other signs of a stroke, such as: A sudden, severe headache with no known cause. Nausea or vomiting. Seizure. These symptoms may represent a serious problem that is an emergency. Do not wait to see if the symptoms will go away. Get medical help right away. Call your local emergency services (911 in the [place] Do not drive yourself to the hospital. Summary You can help to prevent a stroke by eating healthy, exercising, not smoking, limiting alcohol intake, and managing any medical conditions  you may have. Do not use any products that contain nicotine or tobacco. These include cigarettes, chewing tobacco, and vaping devices, such as e-cigarettes. If you need help quitting, ask your health care provider. Remember "BE FAST" for warning signs of a stroke. Get help right away if you or a loved one has any of these signs. This information is not intended to  replace advice given to you by your health care provider. Make sure you discuss any questions you have with your health care provider. Document Revised: 12/02/2019 Document Reviewed: 12/02/2019 Elsevier Patient Education  [place].

## 2021-07-24 ENCOUNTER — Other Ambulatory Visit: Payer: Self-pay | Admitting: Cardiovascular Disease

## 2021-08-18 ENCOUNTER — Other Ambulatory Visit: Payer: Self-pay | Admitting: Cardiovascular Disease

## 2021-08-18 ENCOUNTER — Encounter: Payer: Self-pay | Admitting: Internal Medicine

## 2021-09-13 ENCOUNTER — Other Ambulatory Visit: Payer: Self-pay | Admitting: Internal Medicine

## 2021-11-22 ENCOUNTER — Other Ambulatory Visit: Payer: Self-pay

## 2021-11-22 MED ORDER — VITAMIN D 25 MCG (1000 UNIT) PO TABS: 1000.0000 [IU] | ORAL_TABLET | Freq: Every day | ORAL | 2 refills | Status: AC

## 2021-11-22 MED ORDER — LOSARTAN POTASSIUM 100 MG PO TABS: 100.0000 mg | ORAL_TABLET | Freq: Every day | ORAL | 2 refills | Status: DC

## 2021-11-22 MED ORDER — CLOPIDOGREL BISULFATE 75 MG PO TABS: 75.0000 mg | ORAL_TABLET | Freq: Every day | ORAL | 2 refills | Status: DC

## 2021-11-22 MED ORDER — ATORVASTATIN CALCIUM 40 MG PO TABS: ORAL_TABLET | ORAL | 2 refills | Status: DC

## 2021-11-22 MED ORDER — FLUTICASONE PROPIONATE 50 MCG/ACT NA SUSP: NASAL | 2 refills | Status: DC

## 2021-11-22 MED ORDER — QUETIAPINE FUMARATE 50 MG PO TABS: ORAL_TABLET | ORAL | 2 refills | Status: DC

## 2021-11-22 MED ORDER — PAROXETINE HCL 40 MG PO TABS: 40.0000 mg | ORAL_TABLET | ORAL | 2 refills | Status: DC

## 2021-11-22 MED ORDER — ZOLPIDEM TARTRATE 10 MG PO TABS: ORAL_TABLET | ORAL | 1 refills | Status: DC

## 2021-11-22 MED ORDER — ZOLPIDEM TARTRATE 10 MG PO TABS
ORAL_TABLET | ORAL | 1 refills | Status: DC
Start: 2021-11-22 — End: 2021-12-01

## 2021-11-22 NOTE — Telephone Encounter (Signed)
All prescriptions sent to pharmacy except Zolpidem. Please send for patient

## 2021-11-22 NOTE — Telephone Encounter (Signed)
Pt is requesting all medication be sent to [facility]. [place] [facility] ) - [place], [place]  Medication list QUEtiapine (SEROQUEL) 50 MG tablet atorvastatin (LIPITOR) 40 MG tablet cholecalciferol (VITAMIN D3) 25 MCG (1000 UNIT) tablet PARoxetine (PAXIL) 40 MG tablet losartan (COZAAR) 100 MG tablet clopidogrel (PLAVIX) 75 MG tablet zolpidem (AMBIEN) 10 MG tablet fluticasone (FLONASE) 50 MCG/ACT nasal spray  LOV 05/27/21 ROV 12/01/21

## 2021-12-01 ENCOUNTER — Encounter: Payer: Self-pay | Admitting: Internal Medicine

## 2021-12-01 ENCOUNTER — Ambulatory Visit (INDEPENDENT_AMBULATORY_CARE_PROVIDER_SITE_OTHER): Payer: Medicare Other | Admitting: Internal Medicine

## 2021-12-01 VITALS — BP 172/74 | HR 92 | Temp 98.2°F | Ht 72.0 in | Wt 197.0 lb

## 2021-12-01 DIAGNOSIS — E559 Vitamin D deficiency, unspecified: Secondary | ICD-10-CM | POA: Diagnosis not present

## 2021-12-01 DIAGNOSIS — E1165 Type 2 diabetes mellitus with hyperglycemia: Secondary | ICD-10-CM

## 2021-12-01 DIAGNOSIS — Z1211 Encounter for screening for malignant neoplasm of colon: Secondary | ICD-10-CM

## 2021-12-01 DIAGNOSIS — R7303 Prediabetes: Secondary | ICD-10-CM | POA: Diagnosis not present

## 2021-12-01 DIAGNOSIS — N529 Male erectile dysfunction, unspecified: Secondary | ICD-10-CM

## 2021-12-01 DIAGNOSIS — E782 Mixed hyperlipidemia: Secondary | ICD-10-CM

## 2021-12-01 DIAGNOSIS — E119 Type 2 diabetes mellitus without complications: Secondary | ICD-10-CM | POA: Insufficient documentation

## 2021-12-01 DIAGNOSIS — G2581 Restless legs syndrome: Secondary | ICD-10-CM

## 2021-12-01 DIAGNOSIS — N1831 Chronic kidney disease, stage 3a: Secondary | ICD-10-CM

## 2021-12-01 DIAGNOSIS — E1129 Type 2 diabetes mellitus with other diabetic kidney complication: Secondary | ICD-10-CM | POA: Insufficient documentation

## 2021-12-01 LAB — HEPATIC FUNCTION PANEL
ALT: 31 U/L (ref 0–53)
AST: 33 U/L (ref 0–37)
Albumin: 4.6 g/dL (ref 3.5–5.2)
Alkaline Phosphatase: 118 U/L — ABNORMAL HIGH (ref 39–117)
Bilirubin, Direct: 0.1 mg/dL (ref 0.0–0.3)
Total Bilirubin: 0.6 mg/dL (ref 0.2–1.2)
Total Protein: 7.3 g/dL (ref 6.0–8.3)

## 2021-12-01 LAB — BASIC METABOLIC PANEL
BUN: 14 mg/dL (ref 6–23)
CO2: 26 mEq/L (ref 19–32)
Calcium: 9.1 mg/dL (ref 8.4–10.5)
Chloride: 104 mEq/L (ref 96–112)
Creatinine, Ser: 1.53 mg/dL — ABNORMAL HIGH (ref 0.40–1.50)
GFR: 43.82 mL/min — ABNORMAL LOW (ref >60.00)
Glucose, Bld: 200 mg/dL — ABNORMAL HIGH (ref 70–99)
Potassium: 4.1 mEq/L (ref 3.5–5.1)
Sodium: 138 mEq/L (ref 135–145)

## 2021-12-01 LAB — MICROALBUMIN / CREATININE URINE RATIO
Creatinine,U: 99.6 mg/dL
Microalb Creat Ratio: 0.7 mg/g (ref 0.0–30.0)
Microalb, Ur: <0.7 mg/dL (ref 0.0–1.9)

## 2021-12-01 LAB — LIPID PANEL
Cholesterol: 158 mg/dL (ref 0–200)
HDL: 31.6 mg/dL — ABNORMAL LOW (ref >39.00)
NonHDL: 126.12
Total CHOL/HDL Ratio: 5
Triglycerides: 269 mg/dL — ABNORMAL HIGH (ref 0.0–149.0)
VLDL: 53.8 mg/dL — ABNORMAL HIGH (ref 0.0–40.0)

## 2021-12-01 LAB — HEMOGLOBIN A1C: Hgb A1c MFr Bld: 7.5 % — ABNORMAL HIGH (ref 4.6–6.5)

## 2021-12-01 LAB — VITAMIN D 25 HYDROXY (VIT D DEFICIENCY, FRACTURES): VITD: 28.03 ng/mL — ABNORMAL LOW (ref 30.00–100.00)

## 2021-12-01 LAB — LDL CHOLESTEROL, DIRECT: Direct LDL: 82 mg/dL

## 2021-12-01 MED ORDER — ZOLPIDEM TARTRATE 10 MG PO TABS
ORAL_TABLET | ORAL | 1 refills | Status: DC
Start: 2021-12-01 — End: 2022-01-28

## 2021-12-01 MED ORDER — TADALAFIL 20 MG PO TABS: 10.0000 mg | ORAL_TABLET | ORAL | 11 refills | Status: DC | PRN

## 2021-12-01 MED ORDER — LOSARTAN POTASSIUM 100 MG PO TABS: 100.0000 mg | ORAL_TABLET | Freq: Every day | ORAL | 3 refills | Status: DC

## 2021-12-01 MED ORDER — QUETIAPINE FUMARATE 50 MG PO TABS
ORAL_TABLET | ORAL | 3 refills | Status: DC
Start: 2021-12-01 — End: 2022-01-28

## 2021-12-01 MED ORDER — CLOPIDOGREL BISULFATE 75 MG PO TABS: 75.0000 mg | ORAL_TABLET | Freq: Every day | ORAL | 3 refills | Status: DC

## 2021-12-01 MED ORDER — PAROXETINE HCL 40 MG PO TABS
40.0000 mg | ORAL_TABLET | ORAL | 3 refills | Status: DC
Start: 2021-12-01 — End: 2021-12-31

## 2021-12-01 MED ORDER — ATORVASTATIN CALCIUM 40 MG PO TABS: ORAL_TABLET | ORAL | 3 refills | Status: DC

## 2021-12-01 MED ORDER — ICOSAPENT ETHYL 1 G PO CAPS
ORAL_CAPSULE | ORAL | 3 refills | Status: DC
Start: 2021-12-01 — End: 2022-01-28

## 2021-12-01 MED ORDER — GABAPENTIN 300 MG PO CAPS: 300.0000 mg | ORAL_CAPSULE | Freq: Every day | ORAL | 1 refills | Status: DC

## 2021-12-01 NOTE — Assessment & Plan Note (Signed)
Pt consistently declines statin due to risk of elevated sugar

## 2021-12-01 NOTE — Assessment & Plan Note (Signed)
Last vitamin D Lab Results  Component Value Date   VD25OH 35.36 05/27/2021   Low, to start oral replacement

## 2021-12-01 NOTE — Progress Notes (Signed)
Patient ID: Collin Bridges, male   DOB: 1945-05-23, 76 y.o.   MRN: [id number]        Chief Complaint: follow up HTN, HLD and hyperglycemia , Ed, RLS, ckd       HPI:  Collin Bridges is a 76 y.o. male here overall doing ok, but has new girlfriend, and worsening ED symtpoms, asks for cialis prn.  Trying to follow lower chol diet, does not want statin.  Not taking Vit D.  Has also girlfriend saying he has worsening RLS symptoms, he is asking for tx.  Also she wants him to have colonoscopy - Now asks for colonoscopy screening - has not had one prevoius. Pt denies chest pain, increased sob or doe, wheezing, orthopnea, PND, increased LE swelling, palpitations, dizziness or syncope.   Pt denies polydipsia, polyuria, or new focal neuro s/s.  Out of BP med, needs restart.  Admits compliance may be somewhat of an issue Wt Readings from Last 3 Encounters:  12/01/21 197 lb (89.4 kg)  07/22/21 201 lb 3.2 oz (91.3 kg)  06/16/21 196 lb (88.9 kg)   BP Readings from Last 3 Encounters:  12/01/21 (!) 172/74  07/22/21 (!) 167/92  06/16/21 (!) 151/82         Past Medical History:  Diagnosis Date   Allergy    seasonal   Anxiety    Depression    Headache    Hepatitis C    HLD (hyperlipidemia) 08/09/2017   Hypertension    Hypoglycemia    Stroke [facility])    Past Surgical History:  Procedure Laterality Date   APPENDECTOMY     BUBBLE STUDY  06/16/2021   Procedure: BUBBLE STUDY;  Surgeon: Sande Rives, MD;  Location: [facility] ENDOSCOPY;  Service: Cardiovascular;;   TEE WITHOUT CARDIOVERSION N/A 06/16/2021   Procedure: TRANSESOPHAGEAL ECHOCARDIOGRAM (TEE);  Surgeon: Sande Rives, MD;  Location: [facility] ENDOSCOPY;  Service: Cardiovascular;  Laterality: N/A;   TONSILECTOMY, ADENOIDECTOMY, BILATERAL MYRINGOTOMY AND TUBES      reports that he has quit smoking. He has never used smokeless tobacco. He reports that he does not drink alcohol and does not use drugs. family history includes  Hyperlipidemia in his father; Hypertension in his father; Lung disease in his mother. No Known Allergies Current Outpatient Medications on File Prior to Visit  Medication Sig Dispense Refill   cholecalciferol (VITAMIN D3) 25 MCG (1000 UNIT) tablet Take 1 tablet (1,000 Units total) by mouth daily. Take 1,000 Units by mouth daily. 90 tablet 2   fluticasone (FLONASE) 50 MCG/ACT nasal spray USE 2 SPRAYS IN EACH NOSTRIL DAILY 48 g 2   ibuprofen (ADVIL) 200 MG tablet Take 200 mg by mouth every 6 (six) hours as needed for mild pain.     loratadine (CLARITIN) 10 MG tablet Take 1 tablet (10 mg total) by mouth daily. 90 tablet 2   Multiple Vitamins-Minerals (HAIR/SKIN/NAILS/BIOTIN PO) Take 1 tablet by mouth daily.     vitamin E 180 MG (400 UNITS) capsule Take 400 Units by mouth daily.     No current facility-administered medications on file prior to visit.        ROS:  All others reviewed and negative.  Objective        PE:  BP (!) 172/74 (BP Location: Right Arm, Patient Position: Sitting, Cuff Size: Large)   Pulse 92   Temp 98.2 F (36.8 C) (Oral)   Ht 6' (1.829 m)   Wt 197 lb (89.4 kg)   SpO2 97%  BMI 26.72 kg/m                 Constitutional: Pt appears in NAD               HENT: Head: NCAT.                Right Ear: External ear normal.                 Left Ear: External ear normal.                Eyes: . Pupils are equal, round, and reactive to light. Conjunctivae and EOM are normal               Nose: without d/c or deformity               Neck: Neck supple. Gross normal ROM               Cardiovascular: Normal rate and regular rhythm.                 Pulmonary/Chest: Effort normal and breath sounds without rales or wheezing.                Abd:  Soft, NT, ND, + BS, no organomegaly               Neurological: Pt is alert. At baseline orientation, motor grossly intact               Skin: Skin is warm. No rashes, no other new lesions, LE edema - none               Psychiatric: Pt  behavior is normal without agitation   Micro: none  Cardiac tracings I have personally interpreted today:  none  Pertinent Radiological findings (summarize): none   Lab Results  Component Value Date   WBC 6.4 05/24/2021   HGB 13.6 05/24/2021   HCT 40.3 05/24/2021   PLT 111 (L) 05/24/2021   GLUCOSE 200 (H) 12/01/2021   CHOL 158 12/01/2021   TRIG 269.0 (H) 12/01/2021   HDL 31.60 (L) 12/01/2021   LDLDIRECT 82.0 12/01/2021   LDLCALC UNABLE TO CALCULATE IF TRIGLYCERIDE OVER 400 mg/dL 54/01/8118   ALT 31 14/78/2956   AST 33 12/01/2021   NA 138 12/01/2021   K 4.1 12/01/2021   CL 104 12/01/2021   CREATININE 1.53 (H) 12/01/2021   BUN 14 12/01/2021   CO2 26 12/01/2021   TSH 1.820 05/24/2021   PSA 2.17 05/27/2021   INR 1.0 04/07/2021   HGBA1C 7.5 (H) 12/01/2021   MICROALBUR <0.7 12/01/2021   Assessment/Plan:  Collin Bridges is a 76 y.o. White or Caucasian [1] male with  has a past medical history of Allergy, Anxiety, Depression, Headache, Hepatitis C, HLD (hyperlipidemia) (08/09/2017), Hypertension, Hypoglycemia, and Stroke (HCC).  Vitamin D deficiency Last vitamin D Lab Results  Component Value Date   VD25OH 35.36 05/27/2021   Low, to start oral replacement   HLD (hyperlipidemia) Pt consistently declines statin due to risk of elevated sugar  Diabetes (HCC) Lab Results  Component Value Date   HGBA1C 7.5 (H) 12/01/2021   New onset worsening, pt to start metformin ER 500 qd   RLS (restless legs syndrome) Mild worsening, for gabapentin 300 qhs prn  Erectile dysfunction With worsening symptoms, for cialis prn  CKD (chronic kidney disease) stage 3, GFR 30-59 ml/min (HCC) Lab Results  Component Value Date   CREATININE 1.53 (H) 12/01/2021  Stable overall, cont to avoid nephrotoxins  Followup: Return in about 6 months (around 06/03/2022).  Oliver Barre, MD 12/04/2021 6:42 PM [facility] - [facility]

## 2021-12-01 NOTE — Patient Instructions (Addendum)
Please consider having your Shingles shot at your local pharmacy, since I believe it is free this year.    Please take all new medication as prescribed- the cialis sent to the [facility]  Please take all new medication as prescribed - the gabapentin 300 mg at bedtime for restless legs ([facility])  Please continue all other medications as before, and refills have been done if [facility]  Please take OTC Vitamin D3 at 2000 units per day, indefinitely  Please have the pharmacy call with any other refills you may need.  Please continue your efforts at being more active, low cholesterol diet, and weight control.  Please keep your appointments with your specialists as you may have planned  You will be contacted regarding the referral for:  colonoscopy  Please go to the LAB at the blood drawing area for the tests to be done  You will be contacted by phone if any changes need to be made immediately.  Otherwise, you will receive a letter about your results with an explanation, but please check with MyChart first.  Please remember to sign up for MyChart if you have not done so, as this will be important to you in the future with finding out test results, communicating by private email, and scheduling acute appointments online when needed.  Please make an Appointment to return in 6 months, or sooner if needed

## 2021-12-03 ENCOUNTER — Other Ambulatory Visit: Payer: Self-pay | Admitting: Internal Medicine

## 2021-12-03 MED ORDER — METFORMIN HCL ER 500 MG PO TB24: 500.0000 mg | ORAL_TABLET | Freq: Every day | ORAL | 3 refills | Status: DC

## 2021-12-03 NOTE — Assessment & Plan Note (Signed)
Lab Results  Component Value Date   HGBA1C 7.5 (H) 12/01/2021   New onset worsening, pt to start metformin ER 500 qd

## 2021-12-04 ENCOUNTER — Encounter: Payer: Self-pay | Admitting: Internal Medicine

## 2021-12-04 NOTE — Assessment & Plan Note (Signed)
Lab Results  Component Value Date   CREATININE 1.53 (H) 12/01/2021   Stable overall, cont to avoid nephrotoxins

## 2021-12-04 NOTE — Assessment & Plan Note (Signed)
With worsening symptoms, for cialis prn

## 2021-12-04 NOTE — Assessment & Plan Note (Signed)
Mild worsening, for gabapentin 300 qhs prn

## 2021-12-06 ENCOUNTER — Other Ambulatory Visit: Payer: Self-pay

## 2021-12-19 ENCOUNTER — Encounter: Payer: Self-pay | Admitting: Internal Medicine

## 2021-12-29 ENCOUNTER — Encounter: Payer: Self-pay | Admitting: Physician Assistant

## 2021-12-31 ENCOUNTER — Other Ambulatory Visit: Payer: Self-pay | Admitting: Internal Medicine

## 2022-01-27 NOTE — Progress Notes (Unsigned)
01/31/2022 Collin Bridges [id number] Apr 23, 1946  Referring provider: Biagio Borg, MD Primary GI doctor: Dr. Candis Schatz  ASSESSMENT AND PLAN:   Thrombocytopenia ([place]) thrombocytopenia, elevated alkaline phosphatase. History of Hep C treated interferon 1999 HCV RNA quant negative 2018 Get AB ultrasound to rule out cirrhosis with history and labs, if evidence of splenomegaly/PHTN, can consider adding EGD  Screening colonoscopy he is over the age of 25 and we normally stop surveillance/screening however after a long talk with the patient and his GF about the risks, he feels he is healthly enough to undergo colonoscopy . We have discussed the risks of bleeding, infection, perforation, medication reactions,  and remote risk of death associated with colonoscopy.  All questions were answered and the patient acknowledges these risk and wishes to proceed.  Cerebrovascular accident (CVA) due to thrombosis of precerebral artery [facility]) Patient told to hold his Plavix for 5 days prior to time of procedure. Will instruct when and how to resume after procedure. We will communicate with his prescribing physician Dr. Leonie Man to ensure that holding his Plavix is acceptable. We discussed the risk, benefits and alternatives to colonoscopy/endoscopy.  We also discussed the low but real risk of cardiovascular event such as heart attack, stroke, embolism, thrombosis or ischemia/infarct of other organs off Plavix and explained the need to seek urgent help if this occurs.  He is agreeable and wishes to proceed.  Aortic stenosis, mild 06/2021 TEE with mild AS, EF 60-65%  Type 2 diabetes mellitus with hyperglycemia, without long-term current use of insulin (HCC)  Stage 3a chronic kidney disease [facility])   Patient Care Team: Biagio Borg, MD as PCP - General (Internal Medicine)  HISTORY OF PRESENT ILLNESS:  76 y.o. male with a past medical history of history of hepatitis C, hypertension,  history of CVA 03/2021 on Plavix, depression, appendectomy, diabetes, CKD, thrombocytopenia platelets 111 and others listed below presents for evaluation of screening colonoscopy.   He had syncopal episode at home in Nov 2022, found bilateral subacute cerebral infaracts, 12 mm mass the left tentorium compatible with menigioma 54m. Following with Dr. SLeonie Man   Never had previous screening colonoscopy. VKathleen Argueis here with him, friend of his.  Patient denies family history of colon cancer or other gastrointestinal malignancies. Patient denies GERD.  Patient denies dysphagia, nausea, vomiting, melena.  Patient has a BM every day, no straining. He is on flaxseed. Patient denies change in bowel habits, constipation, diarrhea, hematochezia.  Denies changes in appetite, unintentional weight loss.   Last CBC was 05/2021 showed no anemia but did show chronic thrombocytopenia. No imaging of liver. AST 33, ALT 31, alk phos 118.   Patient does have poorly controlled diabetes A1c 7.5. Use to drink heavily but no drinking since 1993, had a liver biopsy 20 years ago.   He  reports that he has quit smoking. He has never used smokeless tobacco. He reports that he does not drink alcohol and does not use drugs.  Current Medications:   Current Outpatient Medications (Endocrine & Metabolic):    metFORMIN (GLUCOPHAGE-XR) 500 MG 24 hr tablet, Take 1 tablet (500 mg total) by mouth daily with breakfast.  Current Outpatient Medications (Cardiovascular):    atorvastatin (LIPITOR) 40 MG tablet, Take 1 tablet (40 mg total) by mouth daily.   icosapent Ethyl (VASCEPA) 1 g capsule, TAKE 2 CAPSULES(2 GRAMS) BY MOUTH TWICE DAILY   losartan (COZAAR) 100 MG tablet, Take 1 tablet (100 mg total) by mouth daily.   tadalafil (  CIALIS) 20 MG tablet, Take 0.5-1 tablets (10-20 mg total) by mouth every other day as needed for erectile dysfunction.  Current Outpatient Medications (Respiratory):    fluticasone (FLONASE) 50 MCG/ACT  nasal spray, USE 2 SPRAYS IN EACH NOSTRIL DAILY   loratadine (CLARITIN) 10 MG tablet, Take 1 tablet (10 mg total) by mouth daily.  Current Outpatient Medications (Analgesics):    ibuprofen (ADVIL) 200 MG tablet, Take 200 mg by mouth every 6 (six) hours as needed for mild pain.  Current Outpatient Medications (Hematological):    clopidogrel (PLAVIX) 75 MG tablet, Take 1 tablet (75 mg total) by mouth daily.  Current Outpatient Medications (Other):    cholecalciferol (VITAMIN D3) 25 MCG (1000 UNIT) tablet, Take 1 tablet (1,000 Units total) by mouth daily. Take 1,000 Units by mouth daily.   gabapentin (NEURONTIN) 300 MG capsule, Take 1 capsule (300 mg total) by mouth at bedtime.   Multiple Vitamins-Minerals (HAIR/SKIN/NAILS/BIOTIN PO), Take 1 tablet by mouth daily.   PARoxetine (PAXIL) 40 MG tablet, TAKE 1 TABLET(40 MG) BY MOUTH EVERY MORNING   QUEtiapine (SEROQUEL) 50 MG tablet, TAKE 1 TABLET(50 MG) BY MOUTH TWICE DAILY   vitamin E 180 MG (400 UNITS) capsule, Take 400 Units by mouth daily.   zolpidem (AMBIEN) 10 MG tablet, TAKE 1 TABLET(10 MG) BY MOUTH AT BEDTIME AS NEEDED FOR SLEEP  Medical History:  Past Medical History:  Diagnosis Date   Allergy    seasonal   Anxiety    Depression    Headache    Hepatitis C    HLD (hyperlipidemia) 08/09/2017   Hypertension    Hypoglycemia    Stroke ([place])    Allergies: No Known Allergies   Surgical History:  He  has a past surgical history that includes Appendectomy; Tonsilectomy, adenoidectomy, bilateral myringotomy and tubes; TEE without cardioversion (N/A, 06/16/2021); and Bubble study (06/16/2021). Family History:  His family history includes Hyperlipidemia in his father; Hypertension in his father; Lung disease in his mother.  REVIEW OF SYSTEMS  : All other systems reviewed and negative except where noted in the History of Present Illness.  PHYSICAL EXAM: BP (!) 126/58   Pulse 91   Ht 6' (1.829 m)   Wt 198 lb 8 oz (90 kg)   BMI 26.92  kg/m  General:   Pleasant, obese male in no acute distress Head:   Normocephalic and atraumatic. Eyes:  sclerae anicteric,conjunctive pink  Heart:   regular rate and rhythm, systolic murmur Pulm:  Clear anteriorly; no wheezing Abdomen:   Soft, Obese AB, Active bowel sounds. No tenderness . possible hepatomegaly with scratch test.  Rectal: Not evaluated Extremities:  Without edema. Msk: Symmetrical without gross deformities. Peripheral pulses intact.  Neurologic:  Alert and  oriented x4;  No focal deficits. Able to get on the table with minimal assistance. Skin:   Dry and intact without significant lesions or rashes. Psychiatric:  Cooperative. Normal mood and flat affect.    Vladimir Crofts, PA-C 8:49 AM

## 2022-01-28 ENCOUNTER — Other Ambulatory Visit: Payer: Self-pay | Admitting: Internal Medicine

## 2022-01-28 MED ORDER — ATORVASTATIN CALCIUM 40 MG PO TABS: 40.0000 mg | ORAL_TABLET | Freq: Every day | ORAL | 3 refills | Status: DC

## 2022-01-28 MED ORDER — ICOSAPENT ETHYL 1 G PO CAPS: ORAL_CAPSULE | ORAL | 3 refills | Status: DC

## 2022-01-28 MED ORDER — ZOLPIDEM TARTRATE 10 MG PO TABS: ORAL_TABLET | ORAL | 1 refills | Status: DC

## 2022-01-28 MED ORDER — QUETIAPINE FUMARATE 50 MG PO TABS: ORAL_TABLET | ORAL | 1 refills | Status: DC

## 2022-01-31 ENCOUNTER — Encounter: Payer: Self-pay | Admitting: Internal Medicine

## 2022-01-31 ENCOUNTER — Encounter: Payer: Self-pay | Admitting: Physician Assistant

## 2022-01-31 ENCOUNTER — Telehealth: Payer: Self-pay

## 2022-01-31 ENCOUNTER — Other Ambulatory Visit: Payer: Self-pay | Admitting: Internal Medicine

## 2022-01-31 ENCOUNTER — Ambulatory Visit: Payer: Medicare Other | Admitting: Neurology

## 2022-01-31 ENCOUNTER — Ambulatory Visit (INDEPENDENT_AMBULATORY_CARE_PROVIDER_SITE_OTHER): Payer: Medicare Other | Admitting: Physician Assistant

## 2022-01-31 VITALS — BP 126/58 | HR 91 | Ht 72.0 in | Wt 198.5 lb

## 2022-01-31 DIAGNOSIS — E1165 Type 2 diabetes mellitus with hyperglycemia: Secondary | ICD-10-CM | POA: Diagnosis not present

## 2022-01-31 DIAGNOSIS — D696 Thrombocytopenia, unspecified: Secondary | ICD-10-CM | POA: Diagnosis not present

## 2022-01-31 DIAGNOSIS — I6300 Cerebral infarction due to thrombosis of unspecified precerebral artery: Secondary | ICD-10-CM | POA: Diagnosis not present

## 2022-01-31 DIAGNOSIS — N1831 Chronic kidney disease, stage 3a: Secondary | ICD-10-CM | POA: Diagnosis not present

## 2022-01-31 DIAGNOSIS — I350 Nonrheumatic aortic (valve) stenosis: Secondary | ICD-10-CM

## 2022-01-31 DIAGNOSIS — Z1211 Encounter for screening for malignant neoplasm of colon: Secondary | ICD-10-CM

## 2022-01-31 MED ORDER — NA SULFATE-K SULFATE-MG SULF 17.5-3.13-1.6 GM/177ML PO SOLN: 1.0000 | Freq: Once | ORAL | 0 refills | Status: AC

## 2022-01-31 NOTE — Telephone Encounter (Signed)
Good day Dr. Jenny Reichmann,   This patient is having a colonoscopy on 02-15-2022 with Dr Candis Schatz and we are asking for permission for patient to be off his Plavix for 5 days prior to procedure. Are you willing to approve this? Please respond as soon as possible.   [facility] Pre-operative Risk Assessment     Request for surgical clearance:     Endoscopy Procedure  What type of surgery is being performed?     Colon  When is this surgery scheduled?     02-15-2022  What type of clearance is required ?   Pharmacy  Are there any medications that need to be held prior to surgery and how long? Plavix 5 day hold  Practice name and name of physician performing surgery?      [facility]  What is your office phone and fax number?      Phone- [phone]  [phone]  Anesthesia type (None, local, MAC, general) ?       MAC

## 2022-01-31 NOTE — Progress Notes (Signed)
Agree with the assessment and plan as outlined by Vicie Mutters,  PA-C.  Makenna Macaluso E. Candis Schatz, MD

## 2022-01-31 NOTE — Patient Instructions (Signed)
_______________________________________________________  If you are age 76 or older, your body mass index should be between 23-30. Your Body mass index is 26.92 kg/m. If this is out of the aforementioned range listed, please consider follow up with your Primary Care Provider.  If you are age 29 or younger, your body mass index should be between 19-25. Your Body mass index is 26.92 kg/m. If this is out of the aformentioned range listed, please consider follow up with your Primary Care Provider.   ________________________________________________________  The [facility] providers would like to encourage you to use [facility] to communicate with providers for non-urgent requests or questions.  Due to long hold times on the telephone, sending your provider a message by [facility] may be a faster and more efficient way to get a response.  Please allow 48 business hours for a response.  Please remember that this is for non-urgent requests.  _______________________________________________________  Collin Bridges have been scheduled for a colonoscopy. Please follow written instructions given to you at your visit today.  Please pick up your prep supplies at the pharmacy within the next 1-3 days. If you use inhalers (even only as needed), please bring them with you on the day of your procedure.  You have been scheduled for an abdominal ultrasound at [facility] (1st floor of hospital) on 02-02-2022 at 930am. Please arrive 30 minutes prior to your appointment for registration. Make certain not to have anything to eat or drink after midnight prior to your appointment. Should you need to reschedule your appointment, please contact radiology at [phone]. This test typically takes about 30 minutes to perform.  You will be contacted by our office prior to your procedure for directions on holding your Plavix.  If you do not hear from our office 2 week prior to your scheduled procedure, please call [phone] to  discuss.   It was a pleasure to see you today!  Thank you for trusting me with your gastrointestinal care!

## 2022-01-31 NOTE — Telephone Encounter (Signed)
Ok to hold plavix x 5 days prior

## 2022-02-01 NOTE — Telephone Encounter (Signed)
LVM to the patient to return call in regards to hold plavix for 5 days prior to procedure.

## 2022-02-01 NOTE — Telephone Encounter (Signed)
Patient is informed to hold Plavix for 5 days prior to procedure. Patient verbalized understanding.

## 2022-02-02 ENCOUNTER — Ambulatory Visit (HOSPITAL_COMMUNITY): Payer: Medicare Other

## 2022-02-03 ENCOUNTER — Ambulatory Visit (HOSPITAL_COMMUNITY)
Admission: RE | Admit: 2022-02-03 | Discharge: 2022-02-03 | Disposition: A | Discharge status: Home / Self Care | Payer: Medicare Other | Source: Ambulatory Visit | Attending: Physician Assistant | Admitting: Physician Assistant

## 2022-02-03 DIAGNOSIS — D696 Thrombocytopenia, unspecified: Secondary | ICD-10-CM | POA: Diagnosis present

## 2022-02-08 ENCOUNTER — Telehealth: Payer: Self-pay

## 2022-02-08 ENCOUNTER — Other Ambulatory Visit: Payer: Self-pay

## 2022-02-08 ENCOUNTER — Encounter: Payer: Self-pay | Admitting: Gastroenterology

## 2022-02-08 DIAGNOSIS — R748 Abnormal levels of other serum enzymes: Secondary | ICD-10-CM

## 2022-02-08 DIAGNOSIS — D696 Thrombocytopenia, unspecified: Secondary | ICD-10-CM

## 2022-02-08 NOTE — Telephone Encounter (Signed)
Patient called in to discuss imaging results further. All questions answered & he has been advised to proceed with CT and colonoscopy as scheduled.

## 2022-02-14 ENCOUNTER — Encounter: Payer: Self-pay | Admitting: Neurology

## 2022-02-14 ENCOUNTER — Ambulatory Visit (INDEPENDENT_AMBULATORY_CARE_PROVIDER_SITE_OTHER): Payer: Medicare Other | Admitting: Neurology

## 2022-02-14 VITALS — BP 178/76 | HR 89 | Ht 72.0 in | Wt 196.0 lb

## 2022-02-14 DIAGNOSIS — Z8673 Personal history of transient ischemic attack (TIA), and cerebral infarction without residual deficits: Secondary | ICD-10-CM

## 2022-02-14 DIAGNOSIS — Z86011 Personal history of benign neoplasm of the brain: Secondary | ICD-10-CM | POA: Diagnosis not present

## 2022-02-14 DIAGNOSIS — G3184 Mild cognitive impairment, so stated: Secondary | ICD-10-CM | POA: Diagnosis not present

## 2022-02-14 DIAGNOSIS — R413 Other amnesia: Secondary | ICD-10-CM

## 2022-02-14 NOTE — Progress Notes (Signed)
[facility] [address] [place]. [place] [address] [phone]       OFFICE FOLLOW-UP NOTE  [name] Date of Birth:  09-19-1945 Medical Record Number:  [id number]   HPI: Initial visit 07/22/2021 :Collin Bridges is a pleasant 76 year old Caucasian male initial office follow-up visit following initial hospital consultation visit for stroke in November 2022.  History is obtained from the patient and review of electronic medical records.  I have also personally reviewed available pertinent imaging films in  PACS.Collin Bridges is a 76 y.o. male with a medical history significant for anxiety, depression, hepatitis C, chronic kidney disease stage IIIa, hyperlipidemia, essential hypertension who presented to the ED 11/23 after being sent by his primary care provider due to abnormal MRI report results.  Patient states that he fell on March 10, 2021 in the shower after he slipped sustaining a laceration requiring sutures to the right frontal scalp.  He states he his neck paralyzed from neck down for couple of hours and could not move call for help for a few hours but fortunately recovered.At that time CT imaging revealed a small meningioma below the anterior aspect of the tentorium on the left and was discharged with recommendation for MRI follow-up.  Patient had MRI brain outpatient on 11/19 with results on 11/22 revealing subcentimeter subacute bilateral cerebral infarcts with a 12 mm mass along the left tentorium compatible with a meningioma without mass-effect or edema.  MRI imaging also revealed severe chronic small vessel ischemic disease and patient was asked by his primary provider to come to the ED for further evaluation and work-up of suspected stroke. At baseline patient lives independently, states he does not have any living family members and he does not drive.  He is able to complete his ADLs independently and walks without a walker.  Patient states that he  has been on 325 mg of aspirin for approximately 30 years.  He also endorses blurry vision after falling that resolved after 5 days.  MRI scan of the brain showed tiny suspected subcentimeter cerebellar, left parietal and right frontal subcortical white matter acute infarcts.  12 mm left tentorium likely meningioma.  There was severe changes of chronic small vessel disease.  MRA of the neck was unremarkable.  MRA of the brain with degenerative right posterior cerebral artery aplastic versus occluded A1 segment of the right.  2D echo showed ejection fraction 60 to 65% with suspected calcific beneath the  mitral valve.  LDL cholesterol was 110 mg percent and A1c was 5.8.  Patient was started on dual antiplatelet therapy aspirin and Plavix tolerating well with only minor bruising.   He has not had any recurrent TIA or stroke symptoms.  His blood pressure is under good control.  He is tolerating Lipitor well without muscle aches and pains.  He had outpatient TEE on 06/16/2021 which confirmed calcified chordal tissue under the mitral valve apparatus.  Likely a benign finding.  Redundancy of the interatrial septum but no right-to-left shunt noted.  There was grade 3 aortic.arch plaque noted.  No clot or PFO seen. Update 02/14/2022 : He returns for follow-up after last visit 6 months ago.  Patient states is doing well and had no recurrent stroke or TIA symptoms.  Remains on Plavix which is tolerating well without bruising or bleeding.  He states his blood pressure continues to be high and is working with his primary care physician and today it is elevated at 176/76 in office.  He is  tolerating Lipitor well without side effects.  Last lipid profile on 12/01/2021 showed total cholesterol 158, triglycerides 269, HDL 31 and LDL 126.  Sugars also remain high and last hemoglobin A1c 2 months ago was 7.5.  Patient has new complaints of short-term memory difficulties which he states is for several months now.  He has difficulty  remembering names.  But may remember them sometimes later.  Living with his girlfriend but is mostly independent actives of daily living.  Is responsible for his own medications.  Given up driving years ago.  There is no family history of dementia.  He denies any headache, seizures, head injury with loss of consciousness.  He has not yet had any work-up for reversible causes of memory loss.  He has a small left tentorial meningioma and is now due for surveillance repeat MRI  ROS:   14 system review of systems is positive for left ankle pain, memory loss, difficulty remembering names gait imbalance, falls, bruising, dizziness and all other systems negative  PMH:  Past Medical History:  Diagnosis Date   Allergy    seasonal   Anxiety    Depression    Headache    Hepatitis C    HLD (hyperlipidemia) 08/09/2017   Hypertension    Hypoglycemia    Stroke [facility])     Social History:  Social History   Socioeconomic History   Marital status: Single    Spouse name: Not on file   Number of children: 0   Years of education: some college   Highest education level: Not on file  Occupational History   Occupation: retired  Tobacco Use   Smoking status: Former   Smokeless tobacco: Never   Tobacco comments:    quit 40yr ago, smoke a cigar occassionally.  Substance and Sexual Activity   Alcohol use: No   Drug use: No   Sexual activity: Yes  Other Topics Concern   Not on file  Social History Narrative   Lives alone.   Right-handed.   Two cups caffeine per day.   Social Determinants of Health   Financial Resource Strain: Not on file  Food Insecurity: Not on file  Transportation Needs: Not on file  Physical Activity: Not on file  Stress: Not on file  Social Connections: Not on file  Intimate Partner Violence: Not on file    Medications:   Current Outpatient Medications on File Prior to Visit  Medication Sig Dispense Refill   atorvastatin (LIPITOR) 40 MG tablet Take 1 tablet (40 mg  total) by mouth daily. 90 tablet 3   cholecalciferol (VITAMIN D3) 25 MCG (1000 UNIT) tablet Take 1 tablet (1,000 Units total) by mouth daily. Take 1,000 Units by mouth daily. 90 tablet 2   clopidogrel (PLAVIX) 75 MG tablet Take 1 tablet (75 mg total) by mouth daily. 90 tablet 3   fluticasone (FLONASE) 50 MCG/ACT nasal spray USE 2 SPRAYS IN EACH NOSTRIL DAILY 48 g 2   gabapentin (NEURONTIN) 300 MG capsule TAKE 1 CAPSULE BY MOUTH EVERY  NIGHT AT BEDTIME 100 capsule 1   ibuprofen (ADVIL) 200 MG tablet Take 200 mg by mouth every 6 (six) hours as needed for mild pain.     icosapent Ethyl (VASCEPA) 1 g capsule TAKE 2 CAPSULES(2 GRAMS) BY MOUTH TWICE DAILY 360 capsule 3   loratadine (CLARITIN) 10 MG tablet Take 1 tablet (10 mg total) by mouth daily. 90 tablet 2   losartan (COZAAR) 100 MG tablet Take 1 tablet (100 mg total)  by mouth daily. 90 tablet 3   metFORMIN (GLUCOPHAGE-XR) 500 MG 24 hr tablet Take 1 tablet (500 mg total) by mouth daily with breakfast. 90 tablet 3   Multiple Vitamins-Minerals (HAIR/SKIN/NAILS/BIOTIN PO) Take 1 tablet by mouth daily.     PARoxetine (PAXIL) 40 MG tablet TAKE 1 TABLET(40 MG) BY MOUTH EVERY MORNING 90 tablet 3   QUEtiapine (SEROQUEL) 50 MG tablet TAKE 1 TABLET(50 MG) BY MOUTH TWICE DAILY 180 tablet 1   tadalafil (CIALIS) 20 MG tablet Take 0.5-1 tablets (10-20 mg total) by mouth every other day as needed for erectile dysfunction. 10 tablet 11   vitamin E 180 MG (400 UNITS) capsule Take 400 Units by mouth daily.     zolpidem (AMBIEN) 10 MG tablet TAKE 1 TABLET(10 MG) BY MOUTH AT BEDTIME AS NEEDED FOR SLEEP 90 tablet 1   No current facility-administered medications on file prior to visit.    Allergies:  No Known Allergies  Physical Exam General: well developed, well nourished, pleasant elderly Caucasian male seated, in no evident distress Head: head normocephalic and atraumatic.  Neck: supple with no carotid or supraclavicular bruits Cardiovascular: regular rate and  rhythm, no murmurs Musculoskeletal: no deformity Skin:  no rash/petichiae Vascular:  Normal pulses all extremities Vitals:   02/14/22 0826  BP: (!) 178/76  Pulse: 89   Neurologic Exam Mental Status: Awake and fully alert. Oriented to place and time. Recent and remote memory intact. Attention span, concentration and fund of knowledge appropriate. Mood and affect appropriate.  Diminished recall 0/3.  MoCA score 22/30.  Able to name 11 animals which can walk on 4 legs.  Clock drawing 4/4.  Sporadic depression scale 3 not depressed Cranial Nerves: Fundoscopic exam reveals sharp disc margins. Pupils equal, briskly reactive to light. Extraocular movements full without nystagmus. Visual fields full to confrontation. Hearing intact. Facial sensation intact. Face, tongue, palate moves normally and symmetrically.  Motor: Normal bulk and tone. Normal strength in all tested extremity muscles. Sensory.: intact to touch ,pinprick .position and vibratory sensation.  Coordination: Rapid alternating movements normal in all extremities. Finger-to-nose and heel-to-shin performed accurately bilaterally. Gait and Station: Arises from chair without difficulty. Stance is normal. Gait demonstrates normal stride length and balance . Able to heel, toe and tandem walk with moderate difficulty.  Reflexes: 1+ and symmetric. Toes downgoing.     02/14/2022    8:51 AM  Montreal Cognitive Assessment   Visuospatial/ Executive (0/5) 3  Naming (0/3) 3  Attention: Read list of digits (0/2) 2  Attention: Read list of letters (0/1) 1  Attention: Serial 7 subtraction starting at 100 (0/3) 3  Language: Repeat phrase (0/2) 1  Language : Fluency (0/1) 1  Abstraction (0/2) 2  Delayed Recall (0/5) 0  Orientation (0/6) 6  Total 22      ASSESSMENT: 76 year old Caucasian male with bicerebral tiny subcortical silent lacunar stroke from small vessel disease.  Vascular risk factors of hypertension and hyperlipidemia.  New  complaints of memory loss due to mild cognitive impairment.  History of small left tentorial meningioma.     PLAN: had a long discussion with the patient regarding his history of lacunar stroke and discussed about stroke prevention and treatment and answered questions.  We also discussed his new complaints memory loss discussed mild cognitive impairment and need for further evaluation by checking dementia panel labs, EEG and MRI scan for his meningioma surveillance..  I encouraged him to increase participation in cognitively challenging activities like solving crossword puzzles, doing word  searches, playing bridge and other cognitively challenging activities.  We also discussed memory compensation strategies.  He will return for follow-up in the future in 3 months or call earlier if necessary.. Greater than 50% of time during this prolonged 40 minute visit was spent on counseling,explanation of diagnosis of memory loss and mild cognitive impairment as well as lacunar strokes, planning of further management, discussion with patient and family and coordination of care Antony Contras, MD Note: This document was prepared with digital dictation and possible smart phrase technology. Any transcriptional errors that result from this process are unintentional

## 2022-02-14 NOTE — Patient Instructions (Signed)
I had a long discussion with the patient regarding his history of lacunar stroke and discussed about stroke prevention and treatment and answered questions.  We also discussed his new complaints memory loss discussed mild cognitive impairment and need for further evaluation by checking dementia panel labs, EEG and MRI scan for his meningioma surveillance..  I encouraged him to increase participation in cognitively challenging activities like solving crossword puzzles, doing word searches, playing bridge and other cognitively challenging activities.  We also discussed memory compensation strategies.  He will return for follow-up in the future in 3 months or call earlier if necessary.  Memory Compensation Strategies  Use "WARM" strategy.  W= write it down  A= associate it  R= repeat it  M= make a mental note  2.   You can keep a Social worker.  Use a 3-ring notebook with sections for the following: calendar, important names and phone numbers,  medications, doctors' names/phone numbers, lists/reminders, and a section to journal what you did  each day.   3.    Use a calendar to write appointments down.  4.    Write yourself a schedule for the day.  This can be placed on the calendar or in a separate section of the Memory Notebook.  Keeping a  regular schedule can help memory.  5.    Use medication organizer with sections for each day or morning/evening pills.  You may need help loading it  6.    Keep a basket, or pegboard by the door.  Place items that you need to take out with you in the basket or on the pegboard.  You may also want to  include a message board for reminders.  7.    Use sticky notes.  Place sticky notes with reminders in a place where the task is performed.  For example: " turn off the  stove" placed by the stove, "lock the door" placed on the door at eye level, " take your medications" on  the bathroom mirror or by the place where you normally take your medications.  8.     Use alarms/timers.  Use while cooking to remind yourself to check on food or as a reminder to take your medicine, or as a  reminder to make a call, or as a reminder to perform another task, etc.

## 2022-02-15 ENCOUNTER — Encounter: Payer: Self-pay | Admitting: Gastroenterology

## 2022-02-15 ENCOUNTER — Ambulatory Visit (AMBULATORY_SURGERY_CENTER): Payer: Medicare Other | Admitting: Gastroenterology

## 2022-02-15 VITALS — BP 131/86 | HR 78 | Temp 97.5°F | Resp 15 | Ht 72.0 in | Wt 198.0 lb

## 2022-02-15 DIAGNOSIS — D122 Benign neoplasm of ascending colon: Secondary | ICD-10-CM

## 2022-02-15 DIAGNOSIS — Z1211 Encounter for screening for malignant neoplasm of colon: Secondary | ICD-10-CM

## 2022-02-15 DIAGNOSIS — D125 Benign neoplasm of sigmoid colon: Secondary | ICD-10-CM

## 2022-02-15 LAB — DEMENTIA PANEL
Homocysteine: 16.8 umol/L (ref 0.0–19.2)
RPR Ser Ql: NONREACTIVE
TSH: 1.79 u[IU]/mL (ref 0.450–4.500)
Vitamin B-12: 813 pg/mL (ref 232–1245)

## 2022-02-15 MED ORDER — SODIUM CHLORIDE 0.9 % IV SOLN: 500.0000 mL | Freq: Once | INTRAVENOUS | Status: DC

## 2022-02-15 NOTE — Patient Instructions (Signed)
Please read handouts provided. Continue present medications. Await pathology results. Clip Card. Repeat colonoscopy in 9 months for screening. Resume Plavix ( clopidogrel ) at prior dose in 3 days ( Friday 02/18/2022 ). Okay to take 81 mg aspirin prior to resuming Plavix.   YOU HAD AN ENDOSCOPIC PROCEDURE TODAY AT [facility]:   Refer to the procedure report that was given to you for any specific questions about what was found during the examination.  If the procedure report does not answer your questions, please call your gastroenterologist to clarify.  If you requested that your care partner not be given the details of your procedure findings, then the procedure report has been included in a sealed envelope for you to review at your convenience later.  YOU SHOULD EXPECT: Some feelings of bloating in the abdomen. Passage of more gas than usual.  Walking can help get rid of the air that was put into your GI tract during the procedure and reduce the bloating. If you had a lower endoscopy (such as a colonoscopy or flexible sigmoidoscopy) you may notice spotting of blood in your stool or on the toilet paper. If you underwent a bowel prep for your procedure, you may not have a normal bowel movement for a few days.  Please Note:  You might notice some irritation and congestion in your nose or some drainage.  This is from the oxygen used during your procedure.  There is no need for concern and it should clear up in a day or so.  SYMPTOMS TO REPORT IMMEDIATELY:  Following lower endoscopy (colonoscopy or flexible sigmoidoscopy):  Excessive amounts of blood in the stool  Significant tenderness or worsening of abdominal pains  Swelling of the abdomen that is new, acute  Fever of 100F or higher.  For urgent or emergent issues, a gastroenterologist can be reached at any hour by calling [phone]. Do not use MyChart messaging for urgent concerns.    DIET:  We do recommend a small  meal at first, but then you may proceed to your regular diet.  Drink plenty of fluids but you should avoid alcoholic beverages for 24 hours.  ACTIVITY:  You should plan to take it easy for the rest of today and you should NOT DRIVE or use heavy machinery until tomorrow (because of the sedation medicines used during the test).    FOLLOW UP: Our staff will call the number listed on your records the next business day following your procedure.  We will call around 7:15- 8:00 am to check on you and address any questions or concerns that you may have regarding the information given to you following your procedure. If we do not reach you, we will leave a message.     If any biopsies were taken you will be contacted by phone or by letter within the next 1-3 weeks.  Please call us at [phone] if you have not heard about the biopsies in 3 weeks.    SIGNATURES/CONFIDENTIALITY: You and/or your care partner have signed paperwork which will be entered into your electronic medical record.  These signatures attest to the fact that that the information above on your After Visit Summary has been reviewed and is understood.  Full responsibility of the confidentiality of this discharge information lies with you and/or your care-partner.

## 2022-02-15 NOTE — Op Note (Signed)
[place] Patient Name: Collin Bridges Procedure Date: 02/15/2022 10:09 AM MRN: [id number] Endoscopist: Nicki Reaper E. Candis Schatz , MD Age: 76 Referring MD:  Date of Birth: [birth year] Gender: Male Account #: [id number] Procedure:                Colonoscopy Indications:              Screening for colorectal malignant neoplasm, This                            is the patient's first colonoscopy Medicines:                Monitored Anesthesia Care Procedure:                Pre-Anesthesia Assessment:                           - Prior to the procedure, a History and Physical                            was performed, and patient medications and                            allergies were reviewed. The patient's tolerance of                            previous anesthesia was also reviewed. The risks                            and benefits of the procedure and the sedation                            options and risks were discussed with the patient.                            All questions were answered, and informed consent                            was obtained. Prior Anticoagulants: The patient has                            taken Plavix (clopidogrel), last dose was 5 days                            prior to procedure. ASA Grade Assessment: III - A                            patient with severe systemic disease. After                            reviewing the risks and benefits, the patient was                            deemed in satisfactory condition to undergo the  procedure.                           After obtaining informed consent, the colonoscope                            was passed under direct vision. Throughout the                            procedure, the patient's blood pressure, pulse, and                            oxygen saturations were monitored continuously. The                            CF [id number] #[id number] was introduced through the anus                             and advanced to the the terminal ileum, with                            identification of the appendiceal orifice and IC                            valve. The colonoscopy was performed without                            difficulty. The patient tolerated the procedure                            well. The quality of the bowel preparation was                            adequate. The terminal ileum, ileocecal valve,                            appendiceal orifice, and rectum were photographed.                            The bowel preparation used was SUPREP via split                            dose instruction. Scope In: 10:23:00 AM Scope Out: 11:00:52 AM Scope Withdrawal Time: 0 hours 34 minutes 25 seconds  Total Procedure Duration: 0 hours 37 minutes 52 seconds  Findings:                 The perianal and digital rectal examinations were                            normal. Pertinent negatives include normal                            sphincter tone and no palpable rectal lesions.  A 25 mm polyp was found in the distal ascending                            colon. The polyp was sessile. Polypectomy was                            performed using a piecemeal technique with a cold                            snare. Forceps avulsion technique was used to                            removed small fragments of polyp at the periphery                            of the polypectomy site.Marland Kitchen Resection and retrieval                            were complete. To prevent bleeding after the                            polypectomy, two hemostatic clips were successfully                            placed (MR conditional). There was no bleeding at                            the end of the maneuver. The mucosa just distal to                            the polypectomy site was tattooed with an injection                            of 2 mL of [place] ink. Estimated blood loss  was                            minimal.                           Four sessile polyps were found in the ascending                            colon. The polyps were 4 to 8 mm in size. These                            polyps were removed with a cold snare. Resection                            and retrieval were complete. Estimated blood loss                            was minimal.  A 5 mm polyp was found in the sigmoid colon. The                            polyp was sessile. The polyp was removed with a                            cold snare. Resection and retrieval were complete.                            Estimated blood loss was minimal.                           A few small-mouthed diverticula were found in the                            sigmoid colon, descending colon and ascending colon.                           The exam was otherwise normal throughout the                            examined colon.                           The terminal ileum appeared normal.                           The retroflexed view of the distal rectum and anal                            verge was normal and showed no anal or rectal                            abnormalities. Complications:            No immediate complications. Estimated Blood Loss:     Estimated blood loss was minimal. Impression:               - One 25 mm polyp in the ascending colon, removed                            piecemeal with cold snare polypectomy. Resected and                            retrieved. Clips (MR conditional) were placed.                            Tattooed.                           - Four 4 to 8 mm polyps in the ascending colon,                            removed with a cold snare. Resected and retrieved.                           -  One 5 mm polyp in the sigmoid colon, removed with                            a cold snare. Resected and retrieved.                           - Diverticulosis in the  sigmoid colon, in the                            descending colon and in the ascending colon.                           - The examined portion of the ileum was normal.                           - The distal rectum and anal verge are normal on                            retroflexion view. Recommendation:           - Patient has a contact number available for                            emergencies. The signs and symptoms of potential                            delayed complications were discussed with the                            patient. Return to normal activities tomorrow.                            Written discharge instructions were provided to the                            patient.                           - Resume previous diet.                           - Continue present medications.                           - Await pathology results.                           - Repeat colonoscopy in 9 months for surveillance                            after piecemeal polypectomy.                           - Resume Plavix (clopidogrel) at prior dose in 3  days. Ok to take baby aspirin prior to that. Cassidey Barrales E. Candis Schatz, MD 02/15/2022 11:14:01 AM This report has been signed electronically.

## 2022-02-15 NOTE — Progress Notes (Signed)
Pt's states no medical or surgical changes since previsit or office visit. 

## 2022-02-15 NOTE — Progress Notes (Signed)
Called to room to assist during endoscopic procedure.  Patient ID and intended procedure confirmed with present staff. Received instructions for my participation in the procedure from the performing physician.  

## 2022-02-15 NOTE — Progress Notes (Signed)
PT taken to PACU. Monitors in place. VSS. Report given to RN. 

## 2022-02-15 NOTE — Progress Notes (Signed)
History and Physical Interval Note:  02/15/2022 10:17 AM  Collin Bridges  has presented today for endoscopic procedure(s), with the diagnosis of  Encounter Diagnosis  Name Primary?   Special screening for malignant neoplasms, colon Yes  .  The various methods of evaluation and treatment have been discussed with the patient and/or family. After consideration of risks, benefits and other options for treatment, the patient has consented to  the endoscopic procedure(s).   The patient's history has been reviewed, patient examined, no change in status, stable for endoscopic procedure(s).  I have reviewed the patient's chart and labs.  Questions were answered to the patient's satisfaction.    Patient last took Plavix Sept 27  Collin Bridges E. Candis Schatz, MD [facility]

## 2022-02-16 ENCOUNTER — Telehealth: Payer: Self-pay

## 2022-02-16 NOTE — Telephone Encounter (Signed)
No answer, left message to call if having any issues or concerns, B.Yandell Mcjunkins RN 

## 2022-02-17 NOTE — Progress Notes (Signed)
Collin Bridges,  The polyps that I removed during your recent procedure were completely benign but were proven to be "pre-cancerous" polyps that MAY have grown into cancers if they had not been removed.  Studies shows that at least 20% of women over age 77 and 30% of men over age 69 have pre-cancerous polyps.   The very large polyp removed piecemeal did not have any high risk or aggressive histologic features.   Based on current nationally recognized surveillance guidelines, I recommend that you have a repeat colonoscopy in 6-9 months as we discussed.   If you develop any new rectal bleeding, abdominal pain or significant bowel habit changes, please contact me before then.

## 2022-02-18 NOTE — Progress Notes (Signed)
Kindly inform the patient that lab work for reversible causes of memory loss was all normal.  Nothing to worry about

## 2022-02-22 ENCOUNTER — Ambulatory Visit (INDEPENDENT_AMBULATORY_CARE_PROVIDER_SITE_OTHER): Payer: Medicare Other | Admitting: Neurology

## 2022-02-22 DIAGNOSIS — R569 Unspecified convulsions: Secondary | ICD-10-CM | POA: Diagnosis not present

## 2022-02-22 DIAGNOSIS — R413 Other amnesia: Secondary | ICD-10-CM

## 2022-02-25 ENCOUNTER — Ambulatory Visit (HOSPITAL_COMMUNITY)
Admission: RE | Admit: 2022-02-25 | Discharge: 2022-02-25 | Disposition: A | Discharge status: Home / Self Care | Payer: Medicare Other | Source: Ambulatory Visit | Attending: Physician Assistant | Admitting: Physician Assistant

## 2022-02-25 DIAGNOSIS — D696 Thrombocytopenia, unspecified: Secondary | ICD-10-CM | POA: Diagnosis present

## 2022-02-25 DIAGNOSIS — R748 Abnormal levels of other serum enzymes: Secondary | ICD-10-CM | POA: Insufficient documentation

## 2022-02-28 ENCOUNTER — Telehealth: Payer: Self-pay

## 2022-02-28 NOTE — Telephone Encounter (Signed)
Recall placed for EGD for same time as colon.   Collin Crofts, PA-C  [place], Salvadore Dom, RN  Hey!  Yes, Would recommend getting and EGD with repeat colon in 9 months.  Collin Bridges   Previous Messages   ----- Message -----  From: Carl Best, RN  Sent: 02/28/2022   9:26 AM EDT  To: Collin Crofts, PA-C  Subject: RE:                                             Just wanted to clarify, do I need to schedule EGD & colon together soon? It looks like patient just had colon on 02/15/22 with Dr. Candis Schatz & was recommended to have it again in 9 months.   Thanks!  ----- Message -----  From: Collin Crofts, PA-C  Sent: 02/26/2022  11:09 PM EDT  To: Carl Best, RN  Subject: FW:                                             Some features suggestive of damage to the liver, with history and lab findings would suggest adding an upper endoscopy to repeat colonoscopy with Dr. Candis Schatz to evaluate further, will need a follow up visit in the office shortly after procedures.  Without signs of portal hypertension on the CT scan proceed at [facility] for now.

## 2022-03-01 ENCOUNTER — Telehealth: Payer: Self-pay | Admitting: Internal Medicine

## 2022-03-01 NOTE — Telephone Encounter (Signed)
Left message for patient to call back and schedule Medicare Annual Wellness Visit (AWV).   Please offer to do virtually or by telephone.  Left office number and my jabber [phone].  AWVI eligible as of 02/14/2011  Please schedule at anytime with Nurse Health Advisor.

## 2022-03-03 ENCOUNTER — Encounter: Payer: Self-pay | Admitting: Internal Medicine

## 2022-03-03 MED ORDER — ZOLPIDEM TARTRATE 10 MG PO TABS: ORAL_TABLET | ORAL | 1 refills | Status: DC

## 2022-03-06 ENCOUNTER — Encounter: Payer: Self-pay | Admitting: Internal Medicine

## 2022-03-09 ENCOUNTER — Telehealth: Payer: Self-pay | Admitting: Gastroenterology

## 2022-03-09 NOTE — Telephone Encounter (Signed)
CT results discussed with pt and questions answered.

## 2022-03-09 NOTE — Telephone Encounter (Signed)
Pt is requesting results from his CT. Please advise.

## 2022-03-10 ENCOUNTER — Encounter: Payer: Self-pay | Admitting: Internal Medicine

## 2022-03-10 DIAGNOSIS — M810 Age-related osteoporosis without current pathological fracture: Secondary | ICD-10-CM

## 2022-03-10 NOTE — Telephone Encounter (Signed)
Please to assist pt with scheduling DXA at the [facility] on [address]

## 2022-03-14 ENCOUNTER — Other Ambulatory Visit: Payer: Self-pay

## 2022-03-14 NOTE — Telephone Encounter (Signed)
Called pt to let them know bone density test was order and to call the [facility] to schedule an appointment

## 2022-03-15 ENCOUNTER — Encounter: Payer: Self-pay | Admitting: Internal Medicine

## 2022-03-17 NOTE — Telephone Encounter (Signed)
Patient states that he has not received a call regarding a referral to orthopedics. I don't see one in his chart. Are you able to re-submit it? Please advise.

## 2022-03-29 ENCOUNTER — Encounter: Payer: Self-pay | Admitting: Internal Medicine

## 2022-03-29 ENCOUNTER — Telehealth: Payer: Self-pay | Admitting: Internal Medicine

## 2022-03-29 MED ORDER — CLOPIDOGREL BISULFATE 75 MG PO TABS: 75.0000 mg | ORAL_TABLET | Freq: Every day | ORAL | 2 refills | Status: DC

## 2022-03-29 MED ORDER — ZOLPIDEM TARTRATE 10 MG PO TABS: ORAL_TABLET | ORAL | 1 refills | Status: DC

## 2022-03-29 MED ORDER — ICOSAPENT ETHYL 1 G PO CAPS: ORAL_CAPSULE | ORAL | 2 refills | Status: DC

## 2022-03-29 NOTE — Telephone Encounter (Signed)
Pt called to request sending prescriptions to:  [facility] [place] sends them but he does not get them. When he calls [facility] they say the medications were sent back to them.  Pt unsure what to do.  ICOSAPENT 1 G capsule, take 2 capsules by mouth twice a day Pt only has #10 capsules left  AMBIEN (only 3 pills left)  CLOPIDOGREL 75 MG tablet take one daily  Pt phone [phone]

## 2022-03-29 NOTE — Telephone Encounter (Signed)
Patient haven't been receiving meds through [facility], please send to [facility] on [address].

## 2022-03-29 NOTE — Telephone Encounter (Signed)
Ok meds are done erx

## 2022-03-30 NOTE — Telephone Encounter (Signed)
Patient notified of Rx refills to [facility].

## 2022-03-31 ENCOUNTER — Encounter: Payer: Self-pay | Admitting: Internal Medicine

## 2022-03-31 NOTE — Telephone Encounter (Signed)
Please send to [facility] on [address], have not received through mail order due to error at post office.

## 2022-03-31 NOTE — Telephone Encounter (Signed)
It's a good thing then that was already done just as requested on Nov 14., thanks

## 2022-05-06 ENCOUNTER — Encounter: Payer: Self-pay | Admitting: Internal Medicine

## 2022-05-10 NOTE — Telephone Encounter (Signed)
This has been forwarded

## 2022-05-11 MED ORDER — FENOFIBRATE 160 MG PO TABS: 160.0000 mg | ORAL_TABLET | Freq: Every day | ORAL | 3 refills | Status: DC

## 2022-05-18 ENCOUNTER — Encounter: Payer: Self-pay | Admitting: Emergency Medicine

## 2022-05-18 ENCOUNTER — Ambulatory Visit (INDEPENDENT_AMBULATORY_CARE_PROVIDER_SITE_OTHER): Payer: Medicare Other | Admitting: Emergency Medicine

## 2022-05-18 VITALS — BP 124/82 | HR 82 | Temp 98.8°F | Ht 72.0 in | Wt 183.5 lb

## 2022-05-18 DIAGNOSIS — L089 Local infection of the skin and subcutaneous tissue, unspecified: Secondary | ICD-10-CM | POA: Insufficient documentation

## 2022-05-18 DIAGNOSIS — Z23 Encounter for immunization: Secondary | ICD-10-CM

## 2022-05-18 DIAGNOSIS — S80812A Abrasion, left lower leg, initial encounter: Secondary | ICD-10-CM | POA: Diagnosis not present

## 2022-05-18 DIAGNOSIS — W5503XA Scratched by cat, initial encounter: Secondary | ICD-10-CM | POA: Diagnosis not present

## 2022-05-18 MED ORDER — AMOXICILLIN-POT CLAVULANATE 875-125 MG PO TABS: 1.0000 | ORAL_TABLET | Freq: Two times a day (BID) | ORAL | 0 refills | Status: DC

## 2022-05-18 MED ORDER — TETANUS-DIPHTHERIA TOXOIDS TD 5-2 LFU IM INJ: 0.5000 mL | INJECTION | Freq: Once | INTRAMUSCULAR | Status: DC

## 2022-05-18 NOTE — Progress Notes (Signed)
Collin Bridges 77 y.o.   Chief Complaint  Patient presents with   Acute Visit    Cat scratch on left leg, red, painful, swollen     HISTORY OF PRESENT ILLNESS: Acute problem visit today.  Patient of Dr. [name]. This is a 77 y.o. male complaining of infected cat scratch to left lower leg  HPI   Prior to Admission medications   Medication Sig Start Date End Date Taking? Authorizing Provider  atorvastatin (LIPITOR) 40 MG tablet Take 1 tablet (40 mg total) by mouth daily. 01/28/22  Yes Biagio Borg, MD  cholecalciferol (VITAMIN D3) 25 MCG (1000 UNIT) tablet Take 1 tablet (1,000 Units total) by mouth daily. Take 1,000 Units by mouth daily. 11/22/21  Yes Biagio Borg, MD  clopidogrel (PLAVIX) 75 MG tablet Take 1 tablet (75 mg total) by mouth daily. 03/29/22  Yes Biagio Borg, MD  fenofibrate 160 MG tablet Take 1 tablet (160 mg total) by mouth daily. 05/11/22  Yes Biagio Borg, MD  fluticasone [facility]) 50 MCG/ACT nasal spray USE 2 SPRAYS IN [facility] NOSTRIL DAILY 11/22/21  Yes Biagio Borg, MD  gabapentin (NEURONTIN) 300 MG capsule TAKE 1 CAPSULE BY MOUTH EVERY  NIGHT AT BEDTIME 01/31/22  Yes Biagio Borg, MD  ibuprofen (ADVIL) 200 MG tablet Take 200 mg by mouth every 6 (six) hours as needed for mild pain.   Yes [provider]  losartan (COZAAR) 100 MG tablet Take 1 tablet (100 mg total) by mouth daily. 12/01/21  Yes Biagio Borg, MD  metFORMIN (GLUCOPHAGE-XR) 500 MG 24 hr tablet Take 1 tablet (500 mg total) by mouth daily with breakfast. 12/03/21  Yes Biagio Borg, MD  Multiple Vitamins-Minerals (HAIR/SKIN/NAILS/BIOTIN PO) Take 1 tablet by mouth daily.   Yes [provider]  PARoxetine (PAXIL) 40 MG tablet TAKE 1 TABLET(40 MG) BY MOUTH EVERY MORNING 12/31/21  Yes Biagio Borg, MD  QUEtiapine (SEROQUEL) 50 MG tablet TAKE 1 TABLET(50 MG) BY MOUTH TWICE DAILY 01/28/22  Yes Biagio Borg, MD  tadalafil (CIALIS) 20 MG tablet Take 0.5-1 tablets (10-20 mg total) by mouth  every other day as needed for erectile dysfunction. 12/01/21  Yes Biagio Borg, MD  vitamin E 180 MG (400 UNITS) capsule Take 400 Units by mouth daily.   Yes [provider]  zolpidem (AMBIEN) 10 MG tablet TAKE 1 TABLET(10 MG) BY MOUTH AT BEDTIME AS NEEDED FOR SLEEP 03/29/22  Yes Biagio Borg, MD    No Known Allergies  Patient Active Problem List   Diagnosis Date Noted   Diabetes ([place]) 12/01/2021   RLS (restless legs syndrome) 12/01/2021   Vitamin D deficiency 06/03/2021   History of stroke 04/25/2021   CVA (cerebral vascular accident) ([place]) 04/07/2021   Brain mass 03/17/2021   Headache 03/17/2021   Cerebral atrophy ([place]) 03/17/2021   Stroke of right basal ganglia ([place]) 03/17/2021   Forehead pain 03/17/2021   Increased prostate specific antigen (PSA) velocity 12/14/2020   CKD (chronic kidney disease) stage 3, GFR 30-59 ml/min ([place]) 12/14/2020   HLD (hyperlipidemia) 08/09/2017   Encounter for well adult exam with abnormal findings 07/29/2016   Aortic stenosis, mild 07/29/2016   Insomnia 07/29/2016   Erectile dysfunction 03/30/2016   Anxiety disorder 01/06/2016   Benign prostatic hyperplasia with urinary frequency 01/06/2016   Essential hypertension 02/02/2012   Hypoglycemia 04/15/1946    Past Medical History:  Diagnosis Date   Allergy    seasonal   Anxiety  Depression    Headache    Hepatitis C    HLD (hyperlipidemia) 08/09/2017   Hypertension    Hypoglycemia    Stroke [facility])     Past Surgical History:  Procedure Laterality Date   APPENDECTOMY     BUBBLE STUDY  06/16/2021   Procedure: BUBBLE STUDY;  Surgeon: [name], MD;  Location: [place];  Service: Cardiovascular;;   TEE WITHOUT CARDIOVERSION N/A 06/16/2021   Procedure: TRANSESOPHAGEAL ECHOCARDIOGRAM (TEE);  Surgeon: [name], MD;  Location: [facility] ENDOSCOPY;  Service: Cardiovascular;  Laterality: N/A;   TONSILECTOMY, ADENOIDECTOMY, BILATERAL MYRINGOTOMY AND TUBES      Social  History   Socioeconomic History   Marital status: Single    Spouse name: Not on file   Number of children: 0   Years of education: some college   Highest education level: Not on file  Occupational History   Occupation: retired  Tobacco Use   Smoking status: Former   Smokeless tobacco: Never   Tobacco comments:    quit 63yr ago, smoke a cigar occassionally.  Substance and Sexual Activity   Alcohol use: No   Drug use: No   Sexual activity: Yes  Other Topics Concern   Not on file  Social History Narrative   Lives alone.   Right-handed.   Two cups caffeine per day.   Social Determinants of Health   Financial Resource Strain: Not on file  Food Insecurity: Not on file  Transportation Needs: Not on file  Physical Activity: Not on file  Stress: Not on file  Social Connections: Not on file  Intimate Partner Violence: Not on file    Family History  Problem Relation Age of Onset   Lung disease Mother    Hyperlipidemia Father    Hypertension Father      Review of Systems  Constitutional: Negative.  Negative for chills and fever.  HENT: Negative.  Negative for congestion and sore throat.   Respiratory: Negative.  Negative for cough and shortness of breath.   Cardiovascular: Negative.  Negative for chest pain and palpitations.  Gastrointestinal:  Negative for abdominal pain, diarrhea, nausea and vomiting.  Genitourinary: Negative.  Negative for dysuria and hematuria.  Skin:  Positive for rash.  Neurological: Negative.  Negative for dizziness and headaches.  All other systems reviewed and are negative.  Today's Vitals   05/18/22 1400  BP: 124/82  Pulse: 82  Temp: 98.8 F (37.1 C)  TempSrc: Oral  SpO2: 98%  Weight: 183 lb 8 oz (83.2 kg)  Height: 6' (1.829 m)   Body mass index is 24.89 kg/m.   Physical Exam Vitals reviewed.  Constitutional:      Appearance: Normal appearance.  HENT:     Head: Normocephalic.  Eyes:     Extraocular Movements: Extraocular  movements intact.  Cardiovascular:     Rate and Rhythm: Normal rate.  Pulmonary:     Effort: Pulmonary effort is normal.  Musculoskeletal:     Cervical back: No tenderness.  Lymphadenopathy:     Cervical: No cervical adenopathy.  Skin:    General: Skin is warm and dry.     Comments: Cat scratch wound to left lower leg with surrounding erythema.  See picture below  Neurological:     Mental Status: He is alert and oriented to person, place, and time.  Psychiatric:        Mood and Affect: Mood normal.        Behavior: Behavior normal.  ASSESSMENT & PLAN: A total of 32 minutes was spent with the patient and counseling/coordination of care regarding preparing for this visit, review of most recent office visit notes, review of chronic medical conditions under management, review of all medications, diagnosis of cat scratch infection and need for antibiotics, wound care instructions, need for tetanus prophylaxis, prognosis, documentation and need for follow-up if no better or worse during the next several days.  Problem List Items Addressed This Visit       Musculoskeletal and Integument   Cat scratch of left lower leg    With secondary infection. Needs Td. Wound care instructions given.      Relevant Orders   Td : Tetanus/diphtheria >7yo Preservative  free (Completed)   Skin infection - Primary    Secondary to cat scratch. Recommend to start Augmentin 875 mg twice a day for 7 days Wound care instructions given Needs to be updated on tetanus prophylaxis      Relevant Medications   amoxicillin-clavulanate (AUGMENTIN) 875-125 MG tablet   Other Relevant Orders   Td : Tetanus/diphtheria >7yo Preservative  free (Completed)   Other Visit Diagnoses     Need for vaccination            Patient Instructions  Cellulitis, Adult  Cellulitis is a skin infection. The infected area is often warm, red, swollen, and sore. It occurs most often in the arms and lower legs. It is  very important to get treated for this condition. What are the causes? This condition is caused by bacteria. The bacteria enter through a break in the skin, such as a cut, burn, insect bite, open sore, or crack. What increases the risk? This condition is more likely to occur in people who: Have a weak body defense system (immune system). Have open cuts, burns, bites, or scrapes on the skin. Are older than 78 years of age. Have a blood sugar problem (diabetes). Have a long-lasting (chronic) liver disease (cirrhosis) or kidney disease. Are very overweight (obese). Have a skin problem, such as: Itchy rash (eczema). Slow movement of blood in the veins (venous stasis). Fluid buildup below the skin (edema). Have been treated with high-energy rays (radiation). Use IV drugs. What are the signs or symptoms? Symptoms of this condition include: Skin that is: Red. Streaking. Spotting. Swollen. Sore or painful when you touch it. Warm. A fever. Chills. Blisters. How is this diagnosed? This condition is diagnosed based on: Medical history. Physical exam. Blood tests. Imaging tests. How is this treated? Treatment for this condition may include: Medicines to treat infections or allergies. Home care, such as: Rest. Placing cold or warm cloths (compresses) on the skin. Hospital care, if the condition is very bad. Follow these instructions at home: Medicines Take over-the-counter and prescription medicines only as told by your doctor. If you were prescribed an antibiotic medicine, take it as told by your doctor. Do not stop taking it even if you start to feel better. General instructions  Drink enough fluid to keep your pee (urine) pale yellow. Do not touch or rub the infected area. Raise (elevate) the infected area above the level of your heart while you are sitting or lying down. Place cold or warm cloths on the area as told by your doctor. Keep all follow-up visits as told by your  doctor. This is important. Contact a doctor if: You have a fever. You do not start to get better after 1-2 days of treatment. Your bone or joint under the infected area  starts to hurt after the skin has healed. Your infection comes back. This can happen in the same area or another area. You have a swollen bump in the area. You have new symptoms. You feel ill and have muscle aches and pains. Get help right away if: Your symptoms get worse. You feel very sleepy. You throw up (vomit) or have watery poop (diarrhea) for a long time. You see red streaks coming from the area. Your red area gets larger. Your red area turns dark in color. These symptoms may represent a serious problem that is an emergency. Do not wait to see if the symptoms will go away. Get medical help right away. Call your local emergency services (911 in the [place] Do not drive yourself to the hospital. Summary Cellulitis is a skin infection. The area is often warm, red, swollen, and sore. This condition is treated with medicines, rest, and cold and warm cloths. Take all medicines only as told by your doctor. Tell your doctor if symptoms do not start to get better after 1-2 days of treatment. This information is not intended to replace advice given to you by your health care provider. Make sure you discuss any questions you have with your health care provider. Document Revised: 02/10/2021 Document Reviewed: 02/11/2021 Elsevier Patient Education  [place], MD [facility]

## 2022-05-18 NOTE — Assessment & Plan Note (Signed)
With secondary infection. Needs Td. Wound care instructions given.

## 2022-05-18 NOTE — Patient Instructions (Signed)
Cellulitis, Adult  Cellulitis is a skin infection. The infected area is often warm, red, swollen, and sore. It occurs most often in the arms and lower legs. It is very important to get treated for this condition. What are the causes? This condition is caused by bacteria. The bacteria enter through a break in the skin, such as a cut, burn, insect bite, open sore, or crack. What increases the risk? This condition is more likely to occur in people who: Have a weak body defense system (immune system). Have open cuts, burns, bites, or scrapes on the skin. Are older than 78 years of age. Have a blood sugar problem (diabetes). Have a long-lasting (chronic) liver disease (cirrhosis) or kidney disease. Are very overweight (obese). Have a skin problem, such as: Itchy rash (eczema). Slow movement of blood in the veins (venous stasis). Fluid buildup below the skin (edema). Have been treated with high-energy rays (radiation). Use IV drugs. What are the signs or symptoms? Symptoms of this condition include: Skin that is: Red. Streaking. Spotting. Swollen. Sore or painful when you touch it. Warm. A fever. Chills. Blisters. How is this diagnosed? This condition is diagnosed based on: Medical history. Physical exam. Blood tests. Imaging tests. How is this treated? Treatment for this condition may include: Medicines to treat infections or allergies. Home care, such as: Rest. Placing cold or warm cloths (compresses) on the skin. Hospital care, if the condition is very bad. Follow these instructions at home: Medicines Take over-the-counter and prescription medicines only as told by your doctor. If you were prescribed an antibiotic medicine, take it as told by your doctor. Do not stop taking it even if you start to feel better. General instructions  Drink enough fluid to keep your pee (urine) pale yellow. Do not touch or rub the infected area. Raise (elevate) the infected area above  the level of your heart while you are sitting or lying down. Place cold or warm cloths on the area as told by your doctor. Keep all follow-up visits as told by your doctor. This is important. Contact a doctor if: You have a fever. You do not start to get better after 1-2 days of treatment. Your bone or joint under the infected area starts to hurt after the skin has healed. Your infection comes back. This can happen in the same area or another area. You have a swollen bump in the area. You have new symptoms. You feel ill and have muscle aches and pains. Get help right away if: Your symptoms get worse. You feel very sleepy. You throw up (vomit) or have watery poop (diarrhea) for a long time. You see red streaks coming from the area. Your red area gets larger. Your red area turns dark in color. These symptoms may represent a serious problem that is an emergency. Do not wait to see if the symptoms will go away. Get medical help right away. Call your local emergency services (911 in the [place] Do not drive yourself to the hospital. Summary Cellulitis is a skin infection. The area is often warm, red, swollen, and sore. This condition is treated with medicines, rest, and cold and warm cloths. Take all medicines only as told by your doctor. Tell your doctor if symptoms do not start to get better after 1-2 days of treatment. This information is not intended to replace advice given to you by your health care provider. Make sure you discuss any questions you have with your health care provider. Document Revised: 02/10/2021 Document   Reviewed: 02/11/2021 Elsevier Patient Education  2023 [employer] Inc[employer]  

## 2022-05-18 NOTE — Assessment & Plan Note (Signed)
Secondary to cat scratch. Recommend to start Augmentin 875 mg twice a day for 7 days Wound care instructions given Needs to be updated on tetanus prophylaxis

## 2022-05-24 ENCOUNTER — Other Ambulatory Visit: Payer: Self-pay | Admitting: Emergency Medicine

## 2022-05-24 ENCOUNTER — Encounter: Payer: Self-pay | Admitting: Emergency Medicine

## 2022-05-24 DIAGNOSIS — L089 Local infection of the skin and subcutaneous tissue, unspecified: Secondary | ICD-10-CM

## 2022-05-24 MED ORDER — AMOXICILLIN-POT CLAVULANATE 875-125 MG PO TABS: 1.0000 | ORAL_TABLET | Freq: Two times a day (BID) | ORAL | 0 refills | Status: AC

## 2022-05-24 NOTE — Telephone Encounter (Signed)
Much improved.  Follow-up oral antibiotics with topical over-the-counter triple antibiotic.

## 2022-05-26 ENCOUNTER — Telehealth: Payer: Self-pay | Admitting: Neurology

## 2022-05-26 ENCOUNTER — Ambulatory Visit (INDEPENDENT_AMBULATORY_CARE_PROVIDER_SITE_OTHER): Payer: 59 | Admitting: Neurology

## 2022-05-26 ENCOUNTER — Encounter: Payer: Self-pay | Admitting: Neurology

## 2022-05-26 VITALS — BP 161/77 | HR 84 | Ht 72.0 in | Wt 183.0 lb

## 2022-05-26 DIAGNOSIS — Z8673 Personal history of transient ischemic attack (TIA), and cerebral infarction without residual deficits: Secondary | ICD-10-CM | POA: Diagnosis not present

## 2022-05-26 DIAGNOSIS — D329 Benign neoplasm of meninges, unspecified: Secondary | ICD-10-CM

## 2022-05-26 DIAGNOSIS — G3184 Mild cognitive impairment, so stated: Secondary | ICD-10-CM | POA: Diagnosis not present

## 2022-05-26 NOTE — Progress Notes (Signed)
[facility] [address] [place]. [place] [address] [phone]       OFFICE FOLLOW-UP NOTE  [name] Date of Birth:  09-19-1945 Medical Record Number:  [id number]   HPI: Initial visit 07/22/2021 :[name] is a pleasant 77 year old Caucasian male initial office follow-up visit following initial hospital consultation visit for stroke in November 2022.  History is obtained from the patient and review of electronic medical records.  I have also personally reviewed available pertinent imaging films in  PACS.Collin Bridges is a 77 y.o. male with a medical history significant for anxiety, depression, hepatitis C, chronic kidney disease stage IIIa, hyperlipidemia, essential hypertension who presented to the ED 11/23 after being sent by his primary care provider due to abnormal MRI report results.  Patient states that he fell on March 10, 2021 in the shower after he slipped sustaining a laceration requiring sutures to the right frontal scalp.  He states he his neck paralyzed from neck down for couple of hours and could not move call for help for a few hours but fortunately recovered.At that time CT imaging revealed a small meningioma below the anterior aspect of the tentorium on the left and was discharged with recommendation for MRI follow-up.  Patient had MRI brain outpatient on 11/19 with results on 11/22 revealing subcentimeter subacute bilateral cerebral infarcts with a 12 mm mass along the left tentorium compatible with a meningioma without mass-effect or edema.  MRI imaging also revealed severe chronic small vessel ischemic disease and patient was asked by his primary provider to come to the ED for further evaluation and work-up of suspected stroke. At baseline patient lives independently, states he does not have any living family members and he does not drive.  He is able to complete his ADLs independently and walks without a walker.  Patient states that he  has been on 325 mg of aspirin for approximately 30 years.  He also endorses blurry vision after falling that resolved after 5 days.  MRI scan of the brain showed tiny suspected subcentimeter cerebellar, left parietal and right frontal subcortical white matter acute infarcts.  12 mm left tentorium likely meningioma.  There was severe changes of chronic small vessel disease.  MRA of the neck was unremarkable.  MRA of the brain with degenerative right posterior cerebral artery aplastic versus occluded A1 segment of the right.  2D echo showed ejection fraction 60 to 65% with suspected calcific beneath the  mitral valve.  LDL cholesterol was 110 mg percent and A1c was 5.8.  Patient was started on dual antiplatelet therapy aspirin and Plavix tolerating well with only minor bruising.   He has not had any recurrent TIA or stroke symptoms.  His blood pressure is under good control.  He is tolerating Lipitor well without muscle aches and pains.  He had outpatient TEE on 06/16/2021 which confirmed calcified chordal tissue under the mitral valve apparatus.  Likely a benign finding.  Redundancy of the interatrial septum but no right-to-left shunt noted.  There was grade 3 aortic.arch plaque noted.  No clot or PFO seen. Update 02/14/2022 : He returns for follow-up after last visit 6 months ago.  Patient states is doing well and had no recurrent stroke or TIA symptoms.  Remains on Plavix which is tolerating well without bruising or bleeding.  He states his blood pressure continues to be high and is working with his primary care physician and today it is elevated at 176/76 in office.  He is  tolerating Lipitor well without side effects.  Last lipid profile on 12/01/2021 showed total cholesterol 158, triglycerides 269, HDL 31 and LDL 126.  Sugars also remain high and last hemoglobin A1c 2 months ago was 7.5.  Patient has new complaints of short-term memory difficulties which he states is for several months now.  He has difficulty  remembering names.  But may remember them sometimes later.  Living with his girlfriend but is mostly independent actives of daily living.  Is responsible for his own medications.  Given up driving years ago.  There is no family history of dementia.  He denies any headache, seizures, head injury with loss of consciousness.  He has not yet had any work-up for reversible causes of memory loss.  He has a small left tentorial meningioma and is now due for surveillance repeat MRI Updated 05/26/2022 : He returns for follow-up after last visit 3 months ago.  He states he is doing well and his short-term memory difficulties are unchanged..  I ordered MRI scan of the brain at last visit for unclear reasons that has not been done.  EEG done on 02/28/2021 was normal.  Lab work on 02/14/2022 showed normal vitamin B12, TSH, RPR and homocystine.  Headache With left foot infected and he is currently on antibiotics for that.  Recurrent stroke or TIA symptoms.  He is tolerating Plavix well without bruising or bleeding.  Blood pressure is normally under good control though today it is elevated at 161/77.  States his sugars and good control.No new comp-aints today Today. ROS:   14 system review of systems is positive for left ankle pain, memory loss, difficulty remembering names gait imbalance, falls, bruising, dizziness and all other systems negative  PMH:  Past Medical History:  Diagnosis Date   Allergy    seasonal   Anxiety    Depression    Headache    Hepatitis C    HLD (hyperlipidemia) 08/09/2017   Hypertension    Hypoglycemia    Stroke [facility])     Social History:  Social History   Socioeconomic History   Marital status: Single    Spouse name: Not on file   Number of children: 0   Years of education: some college   Highest education level: Not on file  Occupational History   Occupation: retired  Tobacco Use   Smoking status: Former   Smokeless tobacco: Never   Tobacco comments:    quit 23yr ago,  smoke a cigar occassionally.  Substance and Sexual Activity   Alcohol use: No   Drug use: No   Sexual activity: Yes  Other Topics Concern   Not on file  Social History Narrative   Lives alone.   Right-handed.   Two cups caffeine per day.   Social Determinants of Health   Financial Resource Strain: Not on file  Food Insecurity: Not on file  Transportation Needs: Not on file  Physical Activity: Not on file  Stress: Not on file  Social Connections: Not on file  Intimate Partner Violence: Not on file    Medications:   Current Outpatient Medications on File Prior to Visit  Medication Sig Dispense Refill   amoxicillin-clavulanate (AUGMENTIN) 875-125 MG tablet Take 1 tablet by mouth 2 (two) times daily for 5 days. 10 tablet 0   atorvastatin (LIPITOR) 40 MG tablet Take 1 tablet (40 mg total) by mouth daily. 90 tablet 3   cholecalciferol (VITAMIN D3) 25 MCG (1000 UNIT) tablet Take 1 tablet (1,000 Units total)  by mouth daily. Take 1,000 Units by mouth daily. 90 tablet 2   clopidogrel (PLAVIX) 75 MG tablet Take 1 tablet (75 mg total) by mouth daily. 90 tablet 2   fenofibrate 160 MG tablet Take 1 tablet (160 mg total) by mouth daily. 90 tablet 3   fluticasone (FLONASE) 50 MCG/ACT nasal spray USE 2 SPRAYS IN EACH NOSTRIL DAILY 48 g 2   gabapentin (NEURONTIN) 300 MG capsule TAKE 1 CAPSULE BY MOUTH EVERY  NIGHT AT BEDTIME 100 capsule 1   ibuprofen (ADVIL) 200 MG tablet Take 200 mg by mouth every 6 (six) hours as needed for mild pain.     losartan (COZAAR) 100 MG tablet Take 1 tablet (100 mg total) by mouth daily. 90 tablet 3   metFORMIN (GLUCOPHAGE-XR) 500 MG 24 hr tablet Take 1 tablet (500 mg total) by mouth daily with breakfast. 90 tablet 3   Multiple Vitamins-Minerals (HAIR/SKIN/NAILS/BIOTIN PO) Take 1 tablet by mouth daily.     PARoxetine (PAXIL) 40 MG tablet TAKE 1 TABLET(40 MG) BY MOUTH EVERY MORNING 90 tablet 3   QUEtiapine (SEROQUEL) 50 MG tablet TAKE 1 TABLET(50 MG) BY MOUTH TWICE  DAILY 180 tablet 1   tadalafil (CIALIS) 20 MG tablet Take 0.5-1 tablets (10-20 mg total) by mouth every other day as needed for erectile dysfunction. 10 tablet 11   vitamin E 180 MG (400 UNITS) capsule Take 400 Units by mouth daily.     zolpidem (AMBIEN) 10 MG tablet TAKE 1 TABLET(10 MG) BY MOUTH AT BEDTIME AS NEEDED FOR SLEEP 90 tablet 1   No current facility-administered medications on file prior to visit.    Allergies:  No Known Allergies  Physical Exam General: well developed, well nourished, pleasant elderly Caucasian male seated, in no evident distress Head: head normocephalic and atraumatic.  Neck: supple with no carotid or supraclavicular bruits Cardiovascular: regular rate and rhythm, no murmurs Musculoskeletal: no deformity Skin:  no rash/petichiae Vascular:  Normal pulses all extremities Vitals:   05/26/22 1446  BP: (!) 161/77  Pulse: 84   Neurologic Exam Mental Status: Awake and fully alert. Oriented to place and time. Recent and remote memory intact. Attention span, concentration and fund of knowledge appropriate. Mood and affect appropriate.  Diminished recall 0/3.   Cranial Nerves: Fundoscopic exam not done pupils equal, briskly reactive to light. Extraocular movements full without nystagmus. Visual fields full to confrontation. Hearing intact. Facial sensation intact. Face, tongue, palate moves normally and symmetrically.  Motor: Normal bulk and tone. Normal strength in all tested extremity muscles. Sensory.: intact to touch ,pinprick .position and vibratory sensation.  Coordination: Rapid alternating movements normal in all extremities. Finger-to-nose and heel-to-shin performed accurately bilaterally. Gait and Station: Arises from chair without difficulty. Stance is normal. Gait demonstrates normal stride length and balance . Able to heel, toe and tandem walk with moderate difficulty.  Reflexes: 1+ and symmetric. Toes downgoing.     02/14/2022    8:51 AM  [place]  Cognitive Assessment   Visuospatial/ Executive (0/5) 3  Naming (0/3) 3  Attention: Read list of digits (0/2) 2  Attention: Read list of letters (0/1) 1  Attention: Serial 7 subtraction starting at 100 (0/3) 3  Language: Repeat phrase (0/2) 1  Language : Fluency (0/1) 1  Abstraction (0/2) 2  Delayed Recall (0/5) 0  Orientation (0/6) 6  Total 22      ASSESSMENT: 77 year old Caucasian male with bicerebral tiny subcortical silent lacunar stroke from small vessel disease.  Vascular risk factors of hypertension  and hyperlipidemia.  New complaints of memory loss due to mild cognitive impairment.  History of small left tentorial meningioma.     PLAN: I had a long discussion with the patient regarding his history of lacunar stroke and discussed about stroke prevention and treatment and answered questions.  We also discussed his mild cognitive impairment which appears stable .plan check MRI scan for his meningioma surveillance..  I encouraged him to increase participation in cognitively challenging activities like solving crossword puzzles, doing word searches, playing bridge and other cognitively challenging activities.  We also discussed memory compensation strategies.  He will return for follow-up in the future in 6 months or call earlier if necessary.. Greater than 50% of time during this  35 minute visit was spent on counseling,explanation of diagnosis of memory loss and mild cognitive impairment as well as lacunar strokes, planning of further management, discussion with patient and family and coordination of care Antony Contras, MD Note: This document was prepared with digital dictation and possible smart phrase technology. Any transcriptional errors that result from this process are unintentional

## 2022-05-26 NOTE — Patient Instructions (Signed)
had a long discussion with the patient regarding his history of lacunar stroke and discussed about stroke prevention and treatment and answered questions.  We also discussed his mild cognitive impairment which appears stable .plan check MRI scan for his meningioma surveillance..  I encouraged him to increase participation in cognitively challenging activities like solving crossword puzzles, doing word searches, playing bridge and other cognitively challenging activities.  We also discussed memory compensation strategies.  He will return for follow-up in the future in 6 months or call earlier if necessary..  Memory Compensation Strategies  Use "WARM" strategy.  W= write it down  A= associate it  R= repeat it  M= make a mental note  2.   You can keep a Social worker.  Use a 3-ring notebook with sections for the following: calendar, important names and phone numbers,  medications, doctors' names/phone numbers, lists/reminders, and a section to journal what you did  each day.   3.    Use a calendar to write appointments down.  4.    Write yourself a schedule for the day.  This can be placed on the calendar or in a separate section of the Memory Notebook.  Keeping a  regular schedule can help memory.  5.    Use medication organizer with sections for each day or morning/evening pills.  You may need help loading it  6.    Keep a basket, or pegboard by the door.  Place items that you need to take out with you in the basket or on the pegboard.  You may also want to  include a message board for reminders.  7.    Use sticky notes.  Place sticky notes with reminders in a place where the task is performed.  For example: " turn off the  stove" placed by the stove, "lock the door" placed on the door at eye level, " take your medications" on  the bathroom mirror or by the place where you normally take your medications.  8.    Use alarms/timers.  Use while cooking to remind yourself to check on food or  as a reminder to take your medicine, or as a  reminder to make a call, or as a reminder to perform another task, etc.

## 2022-05-26 NOTE — Telephone Encounter (Signed)
[employer] medicare NPR sent to GI [phone] 

## 2022-05-30 ENCOUNTER — Encounter: Payer: Self-pay | Admitting: Emergency Medicine

## 2022-06-03 ENCOUNTER — Ambulatory Visit (INDEPENDENT_AMBULATORY_CARE_PROVIDER_SITE_OTHER): Payer: 59 | Admitting: Internal Medicine

## 2022-06-03 VITALS — BP 150/90 | HR 82 | Temp 98.1°F | Ht 72.0 in | Wt 184.0 lb

## 2022-06-03 DIAGNOSIS — I10 Essential (primary) hypertension: Secondary | ICD-10-CM

## 2022-06-03 DIAGNOSIS — Z0001 Encounter for general adult medical examination with abnormal findings: Secondary | ICD-10-CM | POA: Diagnosis not present

## 2022-06-03 DIAGNOSIS — N1831 Chronic kidney disease, stage 3a: Secondary | ICD-10-CM

## 2022-06-03 DIAGNOSIS — E782 Mixed hyperlipidemia: Secondary | ICD-10-CM

## 2022-06-03 DIAGNOSIS — E559 Vitamin D deficiency, unspecified: Secondary | ICD-10-CM

## 2022-06-03 DIAGNOSIS — E1165 Type 2 diabetes mellitus with hyperglycemia: Secondary | ICD-10-CM

## 2022-06-03 DIAGNOSIS — Z125 Encounter for screening for malignant neoplasm of prostate: Secondary | ICD-10-CM

## 2022-06-03 DIAGNOSIS — E538 Deficiency of other specified B group vitamins: Secondary | ICD-10-CM

## 2022-06-03 DIAGNOSIS — L03116 Cellulitis of left lower limb: Secondary | ICD-10-CM | POA: Insufficient documentation

## 2022-06-03 DIAGNOSIS — N529 Male erectile dysfunction, unspecified: Secondary | ICD-10-CM | POA: Diagnosis not present

## 2022-06-03 LAB — VITAMIN D 25 HYDROXY (VIT D DEFICIENCY, FRACTURES): VITD: 74.51 ng/mL (ref 30.00–100.00)

## 2022-06-03 LAB — CBC WITH DIFFERENTIAL/PLATELET
Basophils Absolute: 0 10*3/uL (ref 0.0–0.1)
Basophils Relative: 0.4 % (ref 0.0–3.0)
Eosinophils Absolute: 0.8 10*3/uL — ABNORMAL HIGH (ref 0.0–0.7)
Eosinophils Relative: 14.4 % — ABNORMAL HIGH (ref 0.0–5.0)
HCT: 42.8 % (ref 39.0–52.0)
Hemoglobin: 14.8 g/dL (ref 13.0–17.0)
Lymphocytes Relative: 21 % (ref 12.0–46.0)
Lymphs Abs: 1.2 10*3/uL (ref 0.7–4.0)
MCHC: 34.5 g/dL (ref 30.0–36.0)
MCV: 92.3 fl (ref 78.0–100.0)
Monocytes Absolute: 0.4 10*3/uL (ref 0.1–1.0)
Monocytes Relative: 7 % (ref 3.0–12.0)
Neutro Abs: 3.3 10*3/uL (ref 1.4–7.7)
Neutrophils Relative %: 57.2 % (ref 43.0–77.0)
Platelets: 120 10*3/uL — ABNORMAL LOW (ref 150.0–400.0)
RBC: 4.63 Mil/uL (ref 4.22–5.81)
RDW: 13.1 % (ref 11.5–15.5)
WBC: 5.7 10*3/uL (ref 4.0–10.5)

## 2022-06-03 LAB — BASIC METABOLIC PANEL
BUN: 14 mg/dL (ref 6–23)
CO2: 29 mEq/L (ref 19–32)
Calcium: 9.2 mg/dL (ref 8.4–10.5)
Chloride: 103 mEq/L (ref 96–112)
Creatinine, Ser: 1.39 mg/dL (ref 0.40–1.50)
GFR: 48.99 mL/min — ABNORMAL LOW (ref >60.00)
Glucose, Bld: 119 mg/dL — ABNORMAL HIGH (ref 70–99)
Potassium: 4.5 mEq/L (ref 3.5–5.1)
Sodium: 142 mEq/L (ref 135–145)

## 2022-06-03 LAB — HEPATIC FUNCTION PANEL
ALT: 27 U/L (ref 0–53)
AST: 38 U/L — ABNORMAL HIGH (ref 0–37)
Albumin: 4.4 g/dL (ref 3.5–5.2)
Alkaline Phosphatase: 115 U/L (ref 39–117)
Bilirubin, Direct: 0.2 mg/dL (ref 0.0–0.3)
Total Bilirubin: 0.8 mg/dL (ref 0.2–1.2)
Total Protein: 7 g/dL (ref 6.0–8.3)

## 2022-06-03 LAB — HEMOGLOBIN A1C: Hgb A1c MFr Bld: 6.3 % (ref 4.6–6.5)

## 2022-06-03 LAB — URINALYSIS, ROUTINE W REFLEX MICROSCOPIC
Bilirubin Urine: NEGATIVE
Hgb urine dipstick: NEGATIVE
Ketones, ur: NEGATIVE
Leukocytes,Ua: NEGATIVE
Nitrite: NEGATIVE
Specific Gravity, Urine: 1.02 (ref 1.000–1.030)
Total Protein, Urine: NEGATIVE
Urine Glucose: NEGATIVE
Urobilinogen, UA: 0.2 (ref 0.0–1.0)
pH: 7 (ref 5.0–8.0)

## 2022-06-03 LAB — LIPID PANEL
Cholesterol: 254 mg/dL — ABNORMAL HIGH (ref 0–200)
HDL: 39.9 mg/dL (ref >39.00)
NonHDL: 213.86
Total CHOL/HDL Ratio: 6
Triglycerides: 225 mg/dL — ABNORMAL HIGH (ref 0.0–149.0)
VLDL: 45 mg/dL — ABNORMAL HIGH (ref 0.0–40.0)

## 2022-06-03 LAB — MICROALBUMIN / CREATININE URINE RATIO
Creatinine,U: 115.3 mg/dL
Microalb Creat Ratio: 0.6 mg/g (ref 0.0–30.0)
Microalb, Ur: <0.7 mg/dL (ref 0.0–1.9)

## 2022-06-03 LAB — PSA: PSA: 3.06 ng/mL (ref 0.10–4.00)

## 2022-06-03 LAB — LDL CHOLESTEROL, DIRECT: Direct LDL: 146 mg/dL

## 2022-06-03 LAB — TSH: TSH: 0.98 u[IU]/mL (ref 0.35–5.50)

## 2022-06-03 LAB — VITAMIN B12: Vitamin B-12: 1363 pg/mL — ABNORMAL HIGH (ref 211–911)

## 2022-06-03 MED ORDER — SILDENAFIL CITRATE 100 MG PO TABS: 50.0000 mg | ORAL_TABLET | Freq: Every day | ORAL | 11 refills | Status: DC | PRN

## 2022-06-03 NOTE — Progress Notes (Addendum)
Patient ID: Collin Bridges, male   DOB: November 01, 1945, 77 y.o.   MRN: [id number]         Chief Complaint:: wellness exam and Follow-up (10monthf/u. Cat scratch on left leg )  , DM, HTN, hld, ED       HPI:  Collin Anderleis a 77y.o. male here for wellness exam; has had one shingrix so far, plans to f/u next mo for shot #2; o/w up to date                        Also has MRI ordered per Neurology.  Recent cat scratch related left leg cellulitis now much improved, with only a sligthly raised nontender area still not resolved.  Asks for change cialis to viagra as cialis no longer working well enough.  Pt denies chest pain, increased sob or doe, wheezing, orthopnea, PND, increased LE swelling, palpitations, dizziness or syncope.   Pt denies polydipsia, polyuria, or new focal neuro s/s.   Lost several lbs with better diet recently.   Pt denies fever,, night sweats, loss of appetite, or other constitutional symptoms   Pt states BP at home is controlled, does not want med change today Wt Readings from Last 3 Encounters:  06/03/22 184 lb (83.5 kg)  05/26/22 183 lb (83 kg)  05/18/22 183 lb 8 oz (83.2 kg)   BP Readings from Last 3 Encounters:  06/03/22 (!) 150/90  05/26/22 (!) 161/77  05/18/22 124/82   Immunization History  Administered Date(s) Administered   DTaP 05/17/2007   Fluad Quad(high Dose 65+) 03/22/2021   Influenza, High Dose Seasonal PF 02/11/2016   Influenza,inj,Quad PF,6+ Mos 08/09/2017   Influenza-Unspecified 05/22/2020, 03/01/2022   Moderna Sars-Covid-2 Vaccination 09/11/2019, 10/09/2019   Pneumococcal Conjugate-13 07/29/2016   Pneumococcal Polysaccharide-23 05/16/2009, 10/03/2019   RSV,unspecified 05/02/2022   Td 05/18/2022   Tdap 10/03/2019   Zoster Recombinat (Shingrix) 04/18/2022   Health Maintenance Due  Topic Date Due   Medicare Annual Wellness (AWV)  Never done      Past Medical History:  Diagnosis Date   Allergy    seasonal   Anxiety    Depression     Headache    Hepatitis C    HLD (hyperlipidemia) 08/09/2017   Hypertension    Hypoglycemia    Stroke ([facility]    Past Surgical History:  Procedure Laterality Date   APPENDECTOMY     BUBBLE STUDY  06/16/2021   Procedure: BUBBLE STUDY;  Surgeon: OGeralynn Rile MD;  [place] [facility]  Service: Cardiovascular;;   TEE WITHOUT CARDIOVERSION N/A 06/16/2021   Procedure: TRANSESOPHAGEAL ECHOCARDIOGRAM (TEE);  Surgeon: OGeralynn Rile MD;  [place] [facility]  Service: Cardiovascular;  Laterality: N/A;   TONSILECTOMY, ADENOIDECTOMY, BILATERAL MYRINGOTOMY AND TUBES      reports that he has quit smoking. He has never used smokeless tobacco. He reports that he does not drink alcohol and does not use drugs. family history includes Hyperlipidemia in his father; Hypertension in his father; Lung disease in his mother. No Known Allergies Current Outpatient Medications on File Prior to Visit  Medication Sig Dispense Refill   atorvastatin (LIPITOR) 40 MG tablet Take 1 tablet (40 mg total) by mouth daily. 90 tablet 3   cholecalciferol (VITAMIN D3) 25 MCG (1000 UNIT) tablet Take 1 tablet (1,000 Units total) by mouth daily. Take 1,000 Units by mouth daily. 90 tablet 2   clopidogrel (PLAVIX) 75 MG tablet Take 1 tablet (75  mg total) by mouth daily. 90 tablet 2   fenofibrate 160 MG tablet Take 1 tablet (160 mg total) by mouth daily. 90 tablet 3   fluticasone (FLONASE) 50 MCG/ACT nasal spray USE 2 SPRAYS IN EACH NOSTRIL DAILY 48 g 2   gabapentin (NEURONTIN) 300 MG capsule TAKE 1 CAPSULE BY MOUTH EVERY  NIGHT AT BEDTIME 100 capsule 1   ibuprofen (ADVIL) 200 MG tablet Take 200 mg by mouth every 6 (six) hours as needed for mild pain.     losartan (COZAAR) 100 MG tablet Take 1 tablet (100 mg total) by mouth daily. 90 tablet 3   metFORMIN (GLUCOPHAGE-XR) 500 MG 24 hr tablet Take 1 tablet (500 mg total) by mouth daily with breakfast. 90 tablet 3   Multiple Vitamins-Minerals (HAIR/SKIN/NAILS/BIOTIN  PO) Take 1 tablet by mouth daily.     PARoxetine (PAXIL) 40 MG tablet TAKE 1 TABLET(40 MG) BY MOUTH EVERY MORNING 90 tablet 3   QUEtiapine (SEROQUEL) 50 MG tablet TAKE 1 TABLET(50 MG) BY MOUTH TWICE DAILY 180 tablet 1   vitamin E 180 MG (400 UNITS) capsule Take 400 Units by mouth daily.     zolpidem (AMBIEN) 10 MG tablet TAKE 1 TABLET(10 MG) BY MOUTH AT BEDTIME AS NEEDED FOR SLEEP 90 tablet 1   No current facility-administered medications on file prior to visit.        ROS:  All others reviewed and negative.  Objective        PE:  BP (!) 150/90 (BP Location: Right Arm, Patient Position: Sitting, Cuff Size: Large)   Pulse 82   Temp 98.1 F (36.7 C) (Oral)   Ht 6' (1.829 m)   Wt 184 lb (83.5 kg)   SpO2 97%   BMI 24.95 kg/m                 Constitutional: Pt appears in NAD               HENT: Head: NCAT.                Right Ear: External ear normal.                 Left Ear: External ear normal.                Eyes: . Pupils are equal, round, and reactive to light. Conjunctivae and EOM are normal               Nose: without d/c or deformity               Neck: Neck supple. Gross normal ROM               Cardiovascular: Normal rate and regular rhythm.                 Pulmonary/Chest: Effort normal and breath sounds without rales or wheezing.                Abd:  Soft, NT, ND, + BS, no organomegaly               Neurological: Pt is alert. At baseline orientation, motor grossly intact               Skin: Skin is warm. No rashes, no other new lesions, LE edema - none               Psychiatric: Pt behavior is normal without agitation   Micro: none  Cardiac tracings  I have personally interpreted today:  none  Pertinent Radiological findings (summarize): none   Lab Results  Component Value Date   WBC 5.7 06/03/2022   HGB 14.8 06/03/2022   HCT 42.8 06/03/2022   PLT 120.0 (L) 06/03/2022   GLUCOSE 119 (H) 06/03/2022   CHOL 254 (H) 06/03/2022   TRIG 225.0 (H) 06/03/2022   HDL  39.90 06/03/2022   LDLDIRECT 146.0 06/03/2022   LDLCALC UNABLE TO CALCULATE IF TRIGLYCERIDE OVER 400 mg/dL 04/08/2021   ALT 27 06/03/2022   AST 38 (H) 06/03/2022   NA 142 06/03/2022   K 4.5 06/03/2022   CL 103 06/03/2022   CREATININE 1.39 06/03/2022   BUN 14 06/03/2022   CO2 29 06/03/2022   TSH 0.98 06/03/2022   PSA 3.06 06/03/2022   INR 1.0 04/07/2021   HGBA1C 6.3 06/03/2022   MICROALBUR <0.7 06/03/2022   Assessment/Plan:  Collin Bridges is a 77 y.o. White or Caucasian [1] male with  has a past medical history of Allergy, Anxiety, Depression, Headache, Hepatitis C, HLD (hyperlipidemia) (08/09/2017), Hypertension, Hypoglycemia, and Stroke ([place]).  Encounter for well adult exam with abnormal findings Age and sex appropriate education and counseling updated with regular exercise and diet Referrals for preventative services - none needed Immunizations addressed - for shingrix #2 soon Smoking counseling  - none needed Evidence for depression or other mood disorder - none significant Most recent labs reviewed. I have personally reviewed and have noted: 1) the patient's medical and social history 2) The patient's current medications and supplements 3) The patient's height, weight, and BMI have been recorded in the chart   CKD (chronic kidney disease) stage 3, GFR 30-59 ml/min (HCC) Lab Results  Component Value Date   CREATININE 1.39 06/03/2022   Stable overall, cont to avoid nephrotoxins  Diabetes ([place]) Lab Results  Component Value Date   HGBA1C 6.3 06/03/2022   Stable, pt to continue current medical treatment metformin ER 500 mg qd   Erectile dysfunction Worsening, for change cialis to viagra prn  Essential hypertension BP Readings from Last 3 Encounters:  06/03/22 (!) 150/90  05/26/22 (!) 161/77  05/18/22 124/82   Uncontrolled here, but pt adamant bp at home is controled, pt to continue medical treatment losartan 100 mg qd   HLD (hyperlipidemia) Lab  Results  Component Value Date   LDLCALC UNABLE TO CALCULATE IF TRIGLYCERIDE OVER 400 mg/dL 04/08/2021   Stable, pt to start lipitor 40 mg qd  Lab Results  Component Value Date   CHOL 254 (H) 06/03/2022   CHOL 158 12/01/2021   CHOL 197 05/27/2021   Lab Results  Component Value Date   HDL 39.90 06/03/2022   HDL 31.60 (L) 12/01/2021   HDL 40.00 05/27/2021   Lab Results  Component Value Date   LDLCALC UNABLE TO CALCULATE IF TRIGLYCERIDE OVER 400 mg/dL 04/08/2021   LDLCALC 178 (H) 07/29/2016   Lab Results  Component Value Date   TRIG 225.0 (H) 06/03/2022   TRIG 269.0 (H) 12/01/2021   TRIG 287.0 (H) 05/27/2021   Lab Results  Component Value Date   CHOLHDL 6 06/03/2022   CHOLHDL 5 12/01/2021   CHOLHDL 5 05/27/2021   Lab Results  Component Value Date   LDLDIRECT 146.0 06/03/2022   LDLDIRECT 82.0 12/01/2021   LDLDIRECT 113.0 05/27/2021     Left leg cellulitis Resolved, no need further antibx today  Vitamin D deficiency Last vitamin D Lab Results  Component Value Date   VD25OH 74.51 06/03/2022   Stable,  cont oral replacement  Followup: Return in about 6 months (around 12/02/2022).  [name], MD 06/04/2022 10:26 PM [facility]

## 2022-06-03 NOTE — Patient Instructions (Signed)
Ok to change the cialis to viagra as needed  The left leg is much improved, and should continue to heal over the next 2-4 wks.    Please continue all other medications as before, and refills have been done if requested.  Please have the pharmacy call with any other refills you may need.  Please continue your efforts at being more active, low cholesterol diet, and weight control.  You are otherwise up to date with prevention measures today.  Please keep your appointments with your specialists as you may have planned  Please go to the LAB at the blood drawing area for the tests to be done  You will be contacted by phone if any changes need to be made immediately.  Otherwise, you will receive a letter about your results with an explanation, but please check with MyChart first.  Please remember to sign up for MyChart if you have not done so, as this will be important to you in the future with finding out test results, communicating by private email, and scheduling acute appointments online when needed.  Please make an Appointment to return in 6 months, or sooner if needed

## 2022-06-04 ENCOUNTER — Encounter: Payer: Self-pay | Admitting: Internal Medicine

## 2022-06-04 NOTE — Assessment & Plan Note (Signed)
Lab Results  Component Value Date   LDLCALC UNABLE TO CALCULATE IF TRIGLYCERIDE OVER 400 mg/dL 04/08/2021   Stable, pt to start lipitor 40 mg qd  Lab Results  Component Value Date   CHOL 254 (H) 06/03/2022   CHOL 158 12/01/2021   CHOL 197 05/27/2021   Lab Results  Component Value Date   HDL 39.90 06/03/2022   HDL 31.60 (L) 12/01/2021   HDL 40.00 05/27/2021   Lab Results  Component Value Date   LDLCALC UNABLE TO CALCULATE IF TRIGLYCERIDE OVER 400 mg/dL 04/08/2021   LDLCALC 178 (H) 07/29/2016   Lab Results  Component Value Date   TRIG 225.0 (H) 06/03/2022   TRIG 269.0 (H) 12/01/2021   TRIG 287.0 (H) 05/27/2021   Lab Results  Component Value Date   CHOLHDL 6 06/03/2022   CHOLHDL 5 12/01/2021   CHOLHDL 5 05/27/2021   Lab Results  Component Value Date   LDLDIRECT 146.0 06/03/2022   LDLDIRECT 82.0 12/01/2021   LDLDIRECT 113.0 05/27/2021

## 2022-06-04 NOTE — Assessment & Plan Note (Signed)
Worsening, for change cialis to viagra prn

## 2022-06-04 NOTE — Assessment & Plan Note (Signed)
Lab Results  Component Value Date   CREATININE 1.39 06/03/2022   Stable overall, cont to avoid nephrotoxins

## 2022-06-04 NOTE — Assessment & Plan Note (Signed)
Lab Results  Component Value Date   HGBA1C 6.3 06/03/2022   Stable, pt to continue current medical treatment metformin ER 500 mg qd

## 2022-06-04 NOTE — Assessment & Plan Note (Signed)
Last vitamin D Lab Results  Component Value Date   VD25OH 74.51 06/03/2022   Stable, cont oral replacement

## 2022-06-04 NOTE — Assessment & Plan Note (Signed)
Age and sex appropriate education and counseling updated with regular exercise and diet Referrals for preventative services - none needed Immunizations addressed - for shingrix #2 soon Smoking counseling  - none needed Evidence for depression or other mood disorder - none significant Most recent labs reviewed. I have personally reviewed and have noted: 1) the patient's medical and social history 2) The patient's current medications and supplements 3) The patient's height, weight, and BMI have been recorded in the chart

## 2022-06-04 NOTE — Assessment & Plan Note (Signed)
Resolved, no need further antibx today

## 2022-06-04 NOTE — Assessment & Plan Note (Signed)
BP Readings from Last 3 Encounters:  06/03/22 (!) 150/90  05/26/22 (!) 161/77  05/18/22 124/82   Uncontrolled here, but pt adamant bp at home is controled, pt to continue medical treatment losartan 100 mg qd

## 2022-06-10 ENCOUNTER — Encounter: Payer: Self-pay | Admitting: Neurology

## 2022-06-16 DIAGNOSIS — J189 Pneumonia, unspecified organism: Secondary | ICD-10-CM

## 2022-06-16 HISTORY — DX: Pneumonia, unspecified organism: J18.9

## 2022-06-29 ENCOUNTER — Other Ambulatory Visit: Payer: Self-pay | Admitting: Internal Medicine

## 2022-06-29 NOTE — Telephone Encounter (Signed)
Please refill as per office routine med refill policy (all routine meds to be refilled for 3 mo or monthly (per pt preference) up to one year from last visit, then month to month grace period for 3 mo, then further med refills will have to be denied) ? ?

## 2022-07-03 ENCOUNTER — Encounter: Payer: Self-pay | Admitting: Internal Medicine

## 2022-07-07 ENCOUNTER — Encounter: Payer: Self-pay | Admitting: Internal Medicine

## 2022-07-08 ENCOUNTER — Other Ambulatory Visit: Payer: Self-pay

## 2022-07-08 ENCOUNTER — Ambulatory Visit
Admission: RE | Admit: 2022-07-08 | Discharge: 2022-07-08 | Disposition: A | Discharge status: Home / Self Care | Payer: 59 | Source: Ambulatory Visit | Attending: Neurology | Admitting: Neurology

## 2022-07-08 ENCOUNTER — Emergency Department (HOSPITAL_COMMUNITY): Payer: 59

## 2022-07-08 ENCOUNTER — Inpatient Hospital Stay (HOSPITAL_COMMUNITY)
Admission: EM | Admit: 2022-07-08 | Discharge: 2022-07-13 | DRG: 871 | Disposition: A | Discharge status: Home / Self Care | Payer: 59 | Attending: Family Medicine | Admitting: Family Medicine

## 2022-07-08 ENCOUNTER — Encounter (HOSPITAL_COMMUNITY): Payer: Self-pay | Admitting: Internal Medicine

## 2022-07-08 DIAGNOSIS — R7989 Other specified abnormal findings of blood chemistry: Secondary | ICD-10-CM | POA: Diagnosis present

## 2022-07-08 DIAGNOSIS — Z8249 Family history of ischemic heart disease and other diseases of the circulatory system: Secondary | ICD-10-CM

## 2022-07-08 DIAGNOSIS — A419 Sepsis, unspecified organism: Secondary | ICD-10-CM | POA: Diagnosis not present

## 2022-07-08 DIAGNOSIS — Z79899 Other long term (current) drug therapy: Secondary | ICD-10-CM | POA: Diagnosis not present

## 2022-07-08 DIAGNOSIS — I10 Essential (primary) hypertension: Secondary | ICD-10-CM | POA: Diagnosis present

## 2022-07-08 DIAGNOSIS — I130 Hypertensive heart and chronic kidney disease with heart failure and stage 1 through stage 4 chronic kidney disease, or unspecified chronic kidney disease: Secondary | ICD-10-CM | POA: Diagnosis present

## 2022-07-08 DIAGNOSIS — Z8673 Personal history of transient ischemic attack (TIA), and cerebral infarction without residual deficits: Secondary | ICD-10-CM

## 2022-07-08 DIAGNOSIS — E782 Mixed hyperlipidemia: Secondary | ICD-10-CM | POA: Diagnosis not present

## 2022-07-08 DIAGNOSIS — I700 Atherosclerosis of aorta: Secondary | ICD-10-CM | POA: Diagnosis present

## 2022-07-08 DIAGNOSIS — I6381 Other cerebral infarction due to occlusion or stenosis of small artery: Secondary | ICD-10-CM | POA: Diagnosis present

## 2022-07-08 DIAGNOSIS — R0789 Other chest pain: Secondary | ICD-10-CM

## 2022-07-08 DIAGNOSIS — Z23 Encounter for immunization: Secondary | ICD-10-CM

## 2022-07-08 DIAGNOSIS — Z1152 Encounter for screening for COVID-19: Secondary | ICD-10-CM | POA: Diagnosis not present

## 2022-07-08 DIAGNOSIS — R6520 Severe sepsis without septic shock: Secondary | ICD-10-CM | POA: Diagnosis present

## 2022-07-08 DIAGNOSIS — J309 Allergic rhinitis, unspecified: Secondary | ICD-10-CM | POA: Diagnosis present

## 2022-07-08 DIAGNOSIS — I350 Nonrheumatic aortic (valve) stenosis: Secondary | ICD-10-CM

## 2022-07-08 DIAGNOSIS — I671 Cerebral aneurysm, nonruptured: Secondary | ICD-10-CM | POA: Diagnosis present

## 2022-07-08 DIAGNOSIS — K7460 Unspecified cirrhosis of liver: Secondary | ICD-10-CM | POA: Diagnosis present

## 2022-07-08 DIAGNOSIS — J189 Pneumonia, unspecified organism: Secondary | ICD-10-CM | POA: Diagnosis present

## 2022-07-08 DIAGNOSIS — Z87891 Personal history of nicotine dependence: Secondary | ICD-10-CM | POA: Diagnosis not present

## 2022-07-08 DIAGNOSIS — M7989 Other specified soft tissue disorders: Secondary | ICD-10-CM | POA: Diagnosis not present

## 2022-07-08 DIAGNOSIS — Z83438 Family history of other disorder of lipoprotein metabolism and other lipidemia: Secondary | ICD-10-CM

## 2022-07-08 DIAGNOSIS — I639 Cerebral infarction, unspecified: Secondary | ICD-10-CM | POA: Diagnosis not present

## 2022-07-08 DIAGNOSIS — I25118 Atherosclerotic heart disease of native coronary artery with other forms of angina pectoris: Secondary | ICD-10-CM | POA: Diagnosis not present

## 2022-07-08 DIAGNOSIS — D696 Thrombocytopenia, unspecified: Secondary | ICD-10-CM | POA: Diagnosis present

## 2022-07-08 DIAGNOSIS — J301 Allergic rhinitis due to pollen: Secondary | ICD-10-CM

## 2022-07-08 DIAGNOSIS — R931 Abnormal findings on diagnostic imaging of heart and coronary circulation: Secondary | ICD-10-CM | POA: Diagnosis not present

## 2022-07-08 DIAGNOSIS — E1122 Type 2 diabetes mellitus with diabetic chronic kidney disease: Secondary | ICD-10-CM | POA: Diagnosis present

## 2022-07-08 DIAGNOSIS — I25110 Atherosclerotic heart disease of native coronary artery with unstable angina pectoris: Secondary | ICD-10-CM | POA: Diagnosis present

## 2022-07-08 DIAGNOSIS — I5032 Chronic diastolic (congestive) heart failure: Secondary | ICD-10-CM | POA: Diagnosis present

## 2022-07-08 DIAGNOSIS — I080 Rheumatic disorders of both mitral and aortic valves: Secondary | ICD-10-CM | POA: Diagnosis present

## 2022-07-08 DIAGNOSIS — Z7984 Long term (current) use of oral hypoglycemic drugs: Secondary | ICD-10-CM

## 2022-07-08 DIAGNOSIS — R040 Epistaxis: Secondary | ICD-10-CM | POA: Diagnosis not present

## 2022-07-08 DIAGNOSIS — I2510 Atherosclerotic heart disease of native coronary artery without angina pectoris: Secondary | ICD-10-CM | POA: Diagnosis not present

## 2022-07-08 DIAGNOSIS — F419 Anxiety disorder, unspecified: Secondary | ICD-10-CM | POA: Diagnosis present

## 2022-07-08 DIAGNOSIS — I6300 Cerebral infarction due to thrombosis of unspecified precerebral artery: Secondary | ICD-10-CM | POA: Diagnosis not present

## 2022-07-08 DIAGNOSIS — R918 Other nonspecific abnormal finding of lung field: Secondary | ICD-10-CM | POA: Diagnosis present

## 2022-07-08 DIAGNOSIS — I6310 Cerebral infarction due to embolism of unspecified precerebral artery: Secondary | ICD-10-CM | POA: Diagnosis not present

## 2022-07-08 DIAGNOSIS — N1831 Chronic kidney disease, stage 3a: Secondary | ICD-10-CM | POA: Diagnosis present

## 2022-07-08 DIAGNOSIS — F32A Depression, unspecified: Secondary | ICD-10-CM | POA: Diagnosis not present

## 2022-07-08 DIAGNOSIS — E1129 Type 2 diabetes mellitus with other diabetic kidney complication: Secondary | ICD-10-CM

## 2022-07-08 DIAGNOSIS — I6523 Occlusion and stenosis of bilateral carotid arteries: Secondary | ICD-10-CM | POA: Diagnosis not present

## 2022-07-08 DIAGNOSIS — Z7902 Long term (current) use of antithrombotics/antiplatelets: Secondary | ICD-10-CM

## 2022-07-08 DIAGNOSIS — E1165 Type 2 diabetes mellitus with hyperglycemia: Secondary | ICD-10-CM

## 2022-07-08 DIAGNOSIS — E785 Hyperlipidemia, unspecified: Secondary | ICD-10-CM | POA: Diagnosis present

## 2022-07-08 DIAGNOSIS — I214 Non-ST elevation (NSTEMI) myocardial infarction: Secondary | ICD-10-CM | POA: Diagnosis not present

## 2022-07-08 DIAGNOSIS — R079 Chest pain, unspecified: Secondary | ICD-10-CM | POA: Diagnosis not present

## 2022-07-08 DIAGNOSIS — E119 Type 2 diabetes mellitus without complications: Secondary | ICD-10-CM

## 2022-07-08 LAB — COMPREHENSIVE METABOLIC PANEL
ALT: 22 U/L (ref 0–44)
AST: 41 U/L (ref 15–41)
Albumin: 3.7 g/dL (ref 3.5–5.0)
Alkaline Phosphatase: 81 U/L (ref 38–126)
Anion gap: 11 (ref 5–15)
BUN: 18 mg/dL (ref 8–23)
CO2: 22 mmol/L (ref 22–32)
Calcium: 9.4 mg/dL (ref 8.9–10.3)
Chloride: 102 mmol/L (ref 98–111)
Creatinine, Ser: 1.52 mg/dL — ABNORMAL HIGH (ref 0.61–1.24)
GFR, Estimated: 47 mL/min — ABNORMAL LOW (ref >60)
Glucose, Bld: 176 mg/dL — ABNORMAL HIGH (ref 70–99)
Potassium: 3.9 mmol/L (ref 3.5–5.1)
Sodium: 135 mmol/L (ref 135–145)
Total Bilirubin: 0.8 mg/dL (ref 0.3–1.2)
Total Protein: 6.5 g/dL (ref 6.5–8.1)

## 2022-07-08 LAB — RESP PANEL BY RT-PCR (RSV, FLU A&B, COVID)  RVPGX2
Influenza A by PCR: NEGATIVE
Influenza B by PCR: NEGATIVE
Resp Syncytial Virus by PCR: NEGATIVE
SARS Coronavirus 2 by RT PCR: NEGATIVE

## 2022-07-08 LAB — MAGNESIUM: Magnesium: 2.1 mg/dL (ref 1.7–2.4)

## 2022-07-08 LAB — CBC
HCT: 38.7 % — ABNORMAL LOW (ref 39.0–52.0)
Hemoglobin: 13.3 g/dL (ref 13.0–17.0)
MCH: 31.7 pg (ref 26.0–34.0)
MCHC: 34.4 g/dL (ref 30.0–36.0)
MCV: 92.1 fL (ref 80.0–100.0)
Platelets: 121 10*3/uL — ABNORMAL LOW (ref 150–400)
RBC: 4.2 MIL/uL — ABNORMAL LOW (ref 4.22–5.81)
RDW: 12.8 % (ref 11.5–15.5)
WBC: 16.6 10*3/uL — ABNORMAL HIGH (ref 4.0–10.5)
nRBC: 0 % (ref 0.0–0.2)

## 2022-07-08 LAB — TROPONIN I (HIGH SENSITIVITY)
Troponin I (High Sensitivity): 49 ng/L — ABNORMAL HIGH (ref <18)
Troponin I (High Sensitivity): 675 ng/L (ref <18)

## 2022-07-08 LAB — LACTIC ACID, PLASMA: Lactic Acid, Venous: 1.8 mmol/L (ref 0.5–1.9)

## 2022-07-08 MED ORDER — GADOPICLENOL 0.5 MMOL/ML IV SOLN
7.5000 mL | Freq: Once | INTRAVENOUS | Status: AC | PRN
  Administered 2022-07-08: 7.5 mL via INTRAVENOUS

## 2022-07-08 MED ORDER — INSULIN ASPART 100 UNIT/ML IJ SOLN: 0.0000 [IU] | Freq: Three times a day (TID) | INTRAMUSCULAR | Status: DC

## 2022-07-08 MED ORDER — PAROXETINE HCL 20 MG PO TABS
40.0000 mg | ORAL_TABLET | Freq: Every day | ORAL | Status: DC
  Administered 2022-07-09 – 2022-07-13 (×5): 40 mg via ORAL
  Filled 2022-07-08 (×6): qty 2

## 2022-07-08 MED ORDER — ACETAMINOPHEN 325 MG PO TABS
650.0000 mg | ORAL_TABLET | Freq: Four times a day (QID) | ORAL | Status: DC | PRN
  Administered 2022-07-09 – 2022-07-13 (×4): 650 mg via ORAL
  Filled 2022-07-08 (×4): qty 2

## 2022-07-08 MED ORDER — GABAPENTIN 300 MG PO CAPS
300.0000 mg | ORAL_CAPSULE | Freq: Every day | ORAL | Status: DC
  Administered 2022-07-09 – 2022-07-12 (×5): 300 mg via ORAL
  Filled 2022-07-08 (×5): qty 1

## 2022-07-08 MED ORDER — SODIUM CHLORIDE 0.9 % IV SOLN
1.0000 g | INTRAVENOUS | Status: DC
  Administered 2022-07-09 – 2022-07-12 (×4): 1 g via INTRAVENOUS
  Filled 2022-07-08 (×4): qty 10

## 2022-07-08 MED ORDER — SODIUM CHLORIDE 0.9 % IV SOLN
500.0000 mg | INTRAVENOUS | Status: DC
  Administered 2022-07-09 – 2022-07-12 (×4): 500 mg via INTRAVENOUS
  Filled 2022-07-08 (×4): qty 5

## 2022-07-08 MED ORDER — SODIUM CHLORIDE 0.9 % IV SOLN
1.0000 g | Freq: Once | INTRAVENOUS | Status: AC
  Administered 2022-07-08: 1 g via INTRAVENOUS
  Filled 2022-07-08: qty 10

## 2022-07-08 MED ORDER — SODIUM CHLORIDE 0.9 % IV BOLUS
500.0000 mL | Freq: Once | INTRAVENOUS | Status: AC
  Administered 2022-07-08: 500 mL via INTRAVENOUS

## 2022-07-08 MED ORDER — MELATONIN 3 MG PO TABS: 3.0000 mg | ORAL_TABLET | Freq: Every evening | ORAL | Status: DC | PRN

## 2022-07-08 MED ORDER — ATORVASTATIN CALCIUM 40 MG PO TABS
40.0000 mg | ORAL_TABLET | Freq: Every day | ORAL | Status: DC
  Administered 2022-07-09 – 2022-07-10 (×2): 40 mg via ORAL
  Filled 2022-07-08 (×2): qty 1

## 2022-07-08 MED ORDER — ASPIRIN 81 MG PO CHEW
162.0000 mg | CHEWABLE_TABLET | Freq: Once | ORAL | Status: AC
  Administered 2022-07-08: 162 mg via ORAL
  Filled 2022-07-08: qty 2

## 2022-07-08 MED ORDER — FLUTICASONE PROPIONATE 50 MCG/ACT NA SUSP
1.0000 | Freq: Every day | NASAL | Status: DC
  Administered 2022-07-11 – 2022-07-13 (×3): 1 via NASAL
  Filled 2022-07-08: qty 16

## 2022-07-08 MED ORDER — LOSARTAN POTASSIUM 50 MG PO TABS
100.0000 mg | ORAL_TABLET | Freq: Every day | ORAL | Status: DC
  Administered 2022-07-09 – 2022-07-11 (×3): 100 mg via ORAL
  Filled 2022-07-08 (×3): qty 2

## 2022-07-08 MED ORDER — FENOFIBRATE 160 MG PO TABS
160.0000 mg | ORAL_TABLET | Freq: Every day | ORAL | Status: DC
  Administered 2022-07-10 – 2022-07-13 (×4): 160 mg via ORAL
  Filled 2022-07-08 (×5): qty 1

## 2022-07-08 MED ORDER — SODIUM CHLORIDE 0.9 % IV SOLN
500.0000 mg | Freq: Once | INTRAVENOUS | Status: AC
  Administered 2022-07-09: 500 mg via INTRAVENOUS
  Filled 2022-07-08: qty 5

## 2022-07-08 MED ORDER — ONDANSETRON HCL 4 MG/2ML IJ SOLN: 4.0000 mg | Freq: Four times a day (QID) | INTRAMUSCULAR | Status: DC | PRN

## 2022-07-08 MED ORDER — ACETAMINOPHEN 650 MG RE SUPP: 650.0000 mg | Freq: Four times a day (QID) | RECTAL | Status: DC | PRN

## 2022-07-08 MED ORDER — CLOPIDOGREL BISULFATE 75 MG PO TABS
75.0000 mg | ORAL_TABLET | Freq: Every day | ORAL | Status: DC
  Administered 2022-07-09: 75 mg via ORAL
  Filled 2022-07-08: qty 1

## 2022-07-08 MED ORDER — QUETIAPINE FUMARATE 50 MG PO TABS
50.0000 mg | ORAL_TABLET | Freq: Two times a day (BID) | ORAL | Status: DC
  Administered 2022-07-09 – 2022-07-13 (×9): 50 mg via ORAL
  Filled 2022-07-08 (×6): qty 1
  Filled 2022-07-08: qty 2
  Filled 2022-07-08 (×3): qty 1

## 2022-07-08 NOTE — H&P (Signed)
History and Physical      Collin Bridges DOB: 05/19/1945 DOA: 07/08/2022  PCP: Biagio Borg, MD  Patient coming from: home   I have personally briefly reviewed patient's old medical records in [place]  Chief Complaint: Fever, chills  HPI: Collin Bridges is a 77 y.o. male with medical history significant for chronic diastolic heart failure, mild aortic stenosis, CKD 3 ACEs with baseline creatinine 1.3-1.5, type 2 diabetes mellitus, hypertension, hyperlipidemia, who is admitted to [facility] on 07/08/2022 with severe sepsis due to community-acquired pneumonia after presenting from home to [facility] ED complaining of fever, chills.   The patient reports 1 day of new onset subjective fever associate with chills in the absence of full body rigors or generalized myalgias.  He reports that this is associated with a new onset nonproductive cough which prompts intermittent sharp right-sided chest discomfort, that is also worse with deep inspiration.  This right-sided chest discomfort is nonexertional, nonpositional, and not reproducible with palpation of the anterior chest wall.  He notes some intermittent nausea over the course the last day, but in the absence of any vomiting, diarrhea, or abdominal discomfort.  No recent melena or hematochezia.  Denies any associated palpitations, diaphoresis, dizziness, presyncope, or syncope.  No recent headache, neck stiffness, rash, dysuria or gross hematuria.  In the absence of active cough, he denies any chest pain at this time.  Medical history notable for mild aortic stenosis as well as chronic diastolic heart failure, most recent echocardiogram performed in November 2022 notable for LVEF 60 to 65%, no focal asterisks grade 1 diastolic dysfunction, normal right ventricular systolic function, and mild aortic stenosis, measuring 1.80 cm2 calculated via continuity equation using VTI.      ED Course:  Vital signs in the  ED were notable for the following: Temperature max 99.4; heart rate 85-1 10; stop blood pressures in the 120s 150s; respiratory rate 14-18, oxygen saturation 95 to 100% on room air.  Labs were notable for the following: CMP notable for the following: Sodium 135, potassium 3.2, Cardide 22, creatinine 1.53, chemistries prior serum creatinine of 1.39 on 06/03/2022, glucose 176, liver enzymes within normal limits.  High-sensitivity troponin I initially 49, with repeat value trending up to 675, in the absence of any prior high sensitive troponin I data points available to comparison.  CBC notable for white cell count 16,600, hemoglobin 13.3.  Lactic acid is ordered, others are currently pending.  Blood cultures x 2 collected prior to initiation of IV antibiotics.  COVID, influenza, RSV PCR were all negative.  Per my interpretation, EKG in ED demonstrated the following: Sinus rhythm with heart rate 82, normal intervals, no evidence of T wave or ST changes, including no evidence of ST elevation.  Imaging and additional notable ED work-up: 2 view chest x-ray, per formal radiology read showed evidence of right upper lobe airspace opacity consistent with infiltrate is suggestive of pneumonia, in the absence of evidence of edema, effusion, or pneumothorax.  In the context of the patient's atypical chest pain with elevated troponin, EDP contacted on-call cardiology fellow, with additional recommendations pending at this time.  While in the ED, the following were administered: Aspirin 162 mg p.o. x 1, azithromycin, Rocephin, normal symptoms 500 cc bolus.  Subsequently, the patient was admitted for further evaluation management of severe sepsis due to community-acquired pneumonia, with presentation also notable for mildly elevated troponin and atypical chest discomfort.     Review of Systems: As per  HPI otherwise 10 point review of systems negative.   Past Medical History:  Diagnosis Date   Allergy     seasonal   Anxiety    Depression    Headache    Hepatitis C    HLD (hyperlipidemia) 08/09/2017   Hypertension    Hypoglycemia    Stroke [facility])     Past Surgical History:  Procedure Laterality Date   APPENDECTOMY     BUBBLE STUDY  06/16/2021   Procedure: BUBBLE STUDY;  Surgeon: [name], MD;  Location: [place];  Service: Cardiovascular;;   TEE WITHOUT CARDIOVERSION N/A 06/16/2021   Procedure: TRANSESOPHAGEAL ECHOCARDIOGRAM (TEE);  Surgeon: [name], MD;  Location: [place];  Service: Cardiovascular;  Laterality: N/A;   TONSILECTOMY, ADENOIDECTOMY, BILATERAL MYRINGOTOMY AND TUBES      Social History:  reports that he has quit smoking. He has never used smokeless tobacco. He reports that he does not drink alcohol and does not use drugs.   No Known Allergies  Family History  Problem Relation Age of Onset   Lung disease Mother    Hyperlipidemia Father    Hypertension Father     Family history reviewed and not pertinent    Prior to Admission medications   Medication Sig Start Date End Date Taking? Authorizing Provider  atorvastatin (LIPITOR) 40 MG tablet Take 1 tablet (40 mg total) by mouth daily. 01/28/22   Biagio Borg, MD  cholecalciferol (VITAMIN D3) 25 MCG (1000 UNIT) tablet Take 1 tablet (1,000 Units total) by mouth daily. Take 1,000 Units by mouth daily. 11/22/21   Biagio Borg, MD  clopidogrel (PLAVIX) 75 MG tablet Take 1 tablet (75 mg total) by mouth daily. 03/29/22   Biagio Borg, MD  fenofibrate 160 MG tablet Take 1 tablet (160 mg total) by mouth daily. 05/11/22   Biagio Borg, MD  fluticasone [facility]) 50 MCG/ACT nasal spray USE 2 SPRAYS IN [facility] NOSTRIL DAILY 11/22/21   Biagio Borg, MD  gabapentin (NEURONTIN) 300 MG capsule TAKE 1 CAPSULE BY MOUTH EVERY  NIGHT AT BEDTIME 01/31/22   Biagio Borg, MD  ibuprofen (ADVIL) 200 MG tablet Take 200 mg by mouth every 6 (six) hours as needed for mild pain.    [provider]  losartan  (COZAAR) 100 MG tablet Take 1 tablet (100 mg total) by mouth daily. 12/01/21   Biagio Borg, MD  metFORMIN (GLUCOPHAGE-XR) 500 MG 24 hr tablet Take 1 tablet (500 mg total) by mouth daily with breakfast. 12/03/21   Biagio Borg, MD  Multiple Vitamins-Minerals (HAIR/SKIN/NAILS/BIOTIN PO) Take 1 tablet by mouth daily.    [provider]  PARoxetine (PAXIL) 40 MG tablet TAKE 1 TABLET(40 MG) BY MOUTH EVERY MORNING 12/31/21   Biagio Borg, MD  QUEtiapine (SEROQUEL) 50 MG tablet TAKE 1 TABLET(50 MG) BY MOUTH TWICE DAILY 01/28/22   Biagio Borg, MD  sildenafil (VIAGRA) 100 MG tablet Take 0.5-1 tablets (50-100 mg total) by mouth daily as needed for erectile dysfunction. 06/03/22   Biagio Borg, MD  vitamin E 180 MG (400 UNITS) capsule Take 400 Units by mouth daily.    [provider]  zolpidem (AMBIEN) 10 MG tablet TAKE 1 TABLET(10 MG) BY MOUTH AT BEDTIME AS NEEDED FOR SLEEP 03/29/22   Biagio Borg, MD     Objective    Physical Exam: Vitals:   07/08/22 1949 07/08/22 2156 07/08/22 2159 07/08/22 2200  BP: 123/68 (!) 142/94  Pulse: 84 89 86 84  Resp: '18 13 14 16  '$ Temp: 98.1 F (36.7 C) 99.1 F (37.3 C)    TempSrc: Oral Oral    SpO2: 98% 100% 100% 100%  Weight:      Height:        General: appears to be stated age; alert, oriented Skin: warm, dry, no rash Head:  AT/[place] Mouth:  Oral mucosa membranes appear moist, normal dentition Neck: supple; trachea midline Heart:  RRR; did not appreciate any M/R/G Lungs: CTAB, did not appreciate any wheezes, rales, or rhonchi Abdomen: + BS; soft, ND, NT Vascular: 2+ pedal pulses b/l; 2+ radial pulses b/l Extremities: no peripheral edema, no muscle wasting Neuro: strength and sensation intact in upper and lower extremities b/l     Labs on Admission: I have personally reviewed following labs and imaging studies  CBC: Recent Labs  Lab 07/08/22 1811  WBC 16.6*  HGB 13.3  HCT 38.7*  MCV 92.1  PLT 123XX123*   Basic Metabolic  Panel: Recent Labs  Lab 07/08/22 1811 07/08/22 2025  NA 135  --   K 3.9  --   CL 102  --   CO2 22  --   GLUCOSE 176*  --   BUN 18  --   CREATININE 1.52*  --   CALCIUM 9.4  --   MG  --  2.1   GFR: Estimated Creatinine Clearance: 44.7 mL/min (A) (by C-G formula based on SCr of 1.52 mg/dL (H)). Liver Function Tests: Recent Labs  Lab 07/08/22 1811  AST 41  ALT 22  ALKPHOS 81  BILITOT 0.8  PROT 6.5  ALBUMIN 3.7   No results for input(s): "LIPASE", "AMYLASE" in the last 168 hours. No results for input(s): "AMMONIA" in the last 168 hours. Coagulation Profile: No results for input(s): "INR", "PROTIME" in the last 168 hours. Cardiac Enzymes: No results for input(s): "CKTOTAL", "CKMB", "CKMBINDEX", "TROPONINI" in the last 168 hours. BNP (last 3 results) No results for input(s): "PROBNP" in the last 8760 hours. HbA1C: No results for input(s): "HGBA1C" in the last 72 hours. CBG: No results for input(s): "GLUCAP" in the last 168 hours. Lipid Profile: No results for input(s): "CHOL", "HDL", "LDLCALC", "TRIG", "CHOLHDL", "LDLDIRECT" in the last 72 hours. Thyroid Function Tests: No results for input(s): "TSH", "T4TOTAL", "FREET4", "T3FREE", "THYROIDAB" in the last 72 hours. Anemia Panel: No results for input(s): "VITAMINB12", "FOLATE", "FERRITIN", "TIBC", "IRON", "RETICCTPCT" in the last 72 hours. Urine analysis:    Component Value Date/Time   COLORURINE YELLOW 06/03/2022 0849   APPEARANCEUR CLEAR 06/03/2022 0849   LABSPEC 1.020 06/03/2022 0849   PHURINE 7.0 06/03/2022 [place] 06/03/2022 0849   HGBUR NEGATIVE 06/03/2022 0849   BILIRUBINUR NEGATIVE 06/03/2022 0849   KETONESUR NEGATIVE 06/03/2022 0849   UROBILINOGEN 0.2 06/03/2022 0849   NITRITE NEGATIVE 06/03/2022 0849   LEUKOCYTESUR NEGATIVE 06/03/2022 0849    Radiological Exams on Admission: DG Chest 2 View  Result Date: 07/08/2022 CLINICAL DATA:  Chest pain.  Fever.  Flu symptoms. EXAM: CHEST - 2  VIEW COMPARISON:  03/10/2021 FINDINGS: The heart size and mediastinal contours are within normal limits. Elevation of right hemidiaphragm again noted. Mild asymmetric patchy opacity is seen in the right upper lobe which appears new, suspicious for pneumonia. Evidence of pleural effusion. IMPRESSION: Mild asymmetric patchy opacity in right upper lobe, suspicious for pneumonia. Recommend continued chest radiographic follow-up to confirm resolution. Electronically Signed   By: Marlaine Hind M.D.   On: 07/08/2022  18:48      Assessment/Plan   Principal Problem:   CAP (community acquired pneumonia) Active Problems:   Essential hypertension   Mild aortic stenosis   Hyperlipidemia   Stage 3a chronic kidney disease (CKD) (HCC)   DM2 (diabetes mellitus, type 2) (HCC)   Severe sepsis (HCC)   Atypical chest pain   Elevated troponin   Depression   Allergic rhinitis   Chronic diastolic CHF (congestive heart failure) (HCC)     #) Severe sepsis due to community-acquired pneumonia: Diagnosis on the basis of 1 day of new onset subjective fever, chills, nonproductive cough, atypical chest pain, and chest x-ray showing evidence of right upper lobe infiltrate consistent with pneumonia.   SIRS criteria met via presenting leukocytosis, tachycardia. Lactic acid level: Currently pending. Of note, given the associated presence of suspected end organ damage in the form of concominant presenting elevated troponin, criteria are met for pt's sepsis to be considered severe in nature. However, in the absence of lactic acid level that is greater than or equal to 4.0, and in the absence of any associated hypotension refractory to IVF's, there are no indications for administration of a 30 mL/kg IVF bolus at this time.   Additional ED work-up/management notable for: Collection of blood cultures x 2 which occurred prior to initiation of azithromycin and Rocephin, which will be continued for community-acquired pneumonia  coverage.  No e/o additional infectious process at this time, including COVID, RSV, will once PCR which were all negative.  Also check urinalysis.   Plan: CBC w/ diff and CMP in AM.  Follow for results of blood cx's x 2. Abx: Continue Rocephin and azithromycin, as above.  Stat lactic acid, as above.  Add on procalcitonin level.  Check strep pneumonia urine antigen.  Check urinalysis.  This is [place].            #) Atypical chest pain: 1 day of intermittent right-sided, sharp, chest discomfort, worse with cough or deep inspiration, coinciding with evidence of right upper lobe infiltrate on chest x-ray, suggestive of presenting community-acquired pneumonia, as above.  Nonexertional.  Overall, appears atypical for ACS, will also noted that EKG shows no evidence of acute ischemic changes, including evidence of ST elevation.  There is mild elevation presenting troponin, felt to represent type II supply/demand mismatch as a consequence of presenting severe sepsis due to community-acquired pneumonia.  However, will continue to trend troponin and pursue echocardiogram in the morning.  Additionally, EDP has contacted on-call cardiology fellow, with additional recommendations pending at this time.  Given suspected type II process leading to mildly elevated troponin, will refrain from initiation of heparin drip at this time, pending additional cardiology recommendations, as above.  Of note, he has received aspirin 162 mg p.o. in the ED.  Given elevated troponin, pleuritic chest discomfort, will also add on D-dimer, which, if elevated, may consider pursuit of CTA chest to further evaluate for any evidence of underlying pulm acute pulmonary embolism.  Plan: Further evaluation management of presenting community-acquired pneumonia, as above.  Prn acetaminophen.  Repeat troponin in the morning.  Echocardiogram in the urgent in the morning.  EDP is consulted cardiology, with additional recommendations pending at  this time, as above.  Continue home high intensity atorvastatin, as well as losartan.  Repeat CMP, CBC in the morning.  Says prompter.  Add on D-dimer, as above.  Procalcitonin level.  Check lipase.               #) Type  2 Diabetes Mellitus: documented history of such. Home insulin regimen: None. Home oral hypoglycemic agents: Metformin. presenting blood sugar: 176. Most recent A1c noted to be 6.3% when checked on 06/03/2022.   Plan: accuchecks QAC and HS with low dose SSI. hold home oral hypoglycemic agents during this hospitalization.             #) Depression: documented h/o such. On Paxil as outpatient.  Of note, presenting EKG does not reflect evidence of QTc prolongation.  Plan: Continue home Paxil.               #) Hyperlipidemia: documented h/o such. On high intensity atorvastatin as outpatient.   Plan: continue home statin.                #) Essential Hypertension: documented h/o such, with outpatient antihypertensive regimen including losartan.  SBP's in the ED today: 120s to 150s mmHg.   Plan: Close monitoring of subsequent BP via routine VS. continue home losartan.              #) Allergic Rhinitis: documented h/o such, on scheduled intranasal Flonase as outpatient.    Plan: cont home Flonase.                   #) Chronic diastolic heart failure: documented history of such, with most recent echocardiogram performed in November 2022, notable for LVEF 60 to 65% as well as grade 1 diastolic dysfunction, as further detailed above. No clinical evidence to suggest acutely decompensated heart failure at this time. home diuretic regimen reportedly consists of the following: None.  Will follow-up for results of echocardiogram this been ordered for the morning in the setting of mildly elevated presenting troponin, as further detailed above.   Plan: monitor strict I's & O's and daily weights. Repeat CMP in AM.  Check serum mag level.  Follow-up results of echocardiogram ordered for the morning.            #) History of mild aortic stenosis: Documented history of such, with echocardiogram in November 2022 showing aortic stenosis with aortic valve area measured to be 1.80 cm2 as calculated via continuity equation using VTI.  Plan: Will follow for results of echocardiogram this been ordered for the morning.  Monitor strict I's and O's Daily weights.            #) CKD Stage 3a: Documented history of such, with baseline creatinine 1.3-1.5, with presenting creatinine consistent with this baseline.    Plan: Monitor strict I's and O's and daily weights.  Attempt to avoid nephrotoxic agents.  CMP/magnesium level in the AM.         DVT prophylaxis: SCD's   Code Status: Full code Family Communication: none Disposition Plan: Per Rounding Team Consults called: EDP has contacted on-call cardiology fellow, as further detailed above;  Admission status: Inpatient     I SPENT GREATER THAN 75  MINUTES IN CLINICAL CARE TIME/MEDICAL DECISION-MAKING IN COMPLETING THIS ADMISSION.      [place] DO [facility]  From [place]   07/08/2022, 11:53 PM

## 2022-07-08 NOTE — ED Triage Notes (Addendum)
Pt to ED c/o chest pain ,fever, flu symptoms that started this morning. Reports took tylenol at 4pm. Also reports took aspirin prior to arrival

## 2022-07-08 NOTE — ED Provider Triage Note (Signed)
Emergency Medicine Provider Triage Evaluation Note  Collin Bridges , a 77 y.o. male  was evaluated in triage.  Pt complains of fever, chills, body aches, with chest pain starting this morning.  Patient reports he is getting an MRI to check on a known aneurysm identified last year outpatient.  Denies any new headache or other neurologic symptoms.  He reports that he was shaking and the MRI machine, feeling sick.,  He has elevated heart rate, and subjective fever, last took Tylenol at 4 PM, additionally took some aspirin earlier, reports that chest pain is easing off at this time.  He does report that there is some exertional worsening of chest pain, shortness of breath.  He denies cough, sore throat. Does endorse some nausea earlier today.  Review of Systems  Positive: Chest pain, fever, chills Negative:   Physical Exam  BP (!) 157/84 (BP Location: Right Arm)   Pulse (!) 110   Temp 99.4 F (37.4 C) (Oral)   Resp 18   Ht 6' (1.829 m)   Wt 81.6 kg   SpO2 95%   BMI 24.41 kg/m  Gen:   Awake, no distress   Resp:  Normal effort  MSK:   Moves extremities without difficulty  Other:  No ttp of chest wall  Medical Decision Making  Medically screening exam initiated at 6:08 PM.  Appropriate orders placed.  Collin Bridges was informed that the remainder of the evaluation will be completed by another provider, this initial triage assessment does not replace that evaluation, and the importance of remaining in the ED until their evaluation is complete.  Workup initiated in triage    Anselmo Pickler, [place] 07/08/22 1816

## 2022-07-08 NOTE — ED Notes (Signed)
Pt currently denying CP, states "I actually feel a lot better than when I first came!"

## 2022-07-08 NOTE — H&P (Incomplete)
History and Physical      Collin Bridges [id number] DOB: March 04, 1946 DOA: 07/08/2022  PCP: Biagio Borg, MD  Patient coming from: home   I have personally briefly reviewed patient's old medical records in [place]  Chief Complaint: Fever, chills  HPI: Collin Bridges is a 77 y.o. male with medical history significant for chronic diastolic heart failure, mild aortic stenosis, CKD 3 ACEs with baseline creatinine 1.3-1.5, type 2 diabetes mellitus, hypertension, hyperlipidemia, who is admitted to [facility] on 07/08/2022 with severe sepsis due to community-acquired pneumonia after presenting from home to [facility] ED complaining of fever, chills.   The patient reports 1 day of new onset subjective fever associate with chills in the absence of full body rigors or generalized myalgias.  He reports that this is associated with a new onset nonproductive cough which prompts intermittent sharp right-sided chest discomfort, that is also worse with deep inspiration.  This right-sided chest discomfort is nonexertional, nonpositional, and not reproducible with palpation of the anterior chest wall.  He notes some intermittent nausea over the course the last day, but in the absence of any vomiting, diarrhea, or abdominal discomfort.  No recent melena or hematochezia.  Denies any associated palpitations, diaphoresis, dizziness, presyncope, or syncope.  No recent headache, neck stiffness, rash, dysuria or gross hematuria.  In the absence of active cough, he denies any chest pain at this time.  Medical history notable for mild aortic stenosis as well as chronic diastolic heart failure, most recent echocardiogram performed in November 2022 notable for LVEF 60 to 65%, no focal asterisks grade 1 diastolic dysfunction, normal right ventricular systolic function, and mild aortic stenosis, measuring 1.8-0***calculated via continuity equation using VTI.      ED Course:  Vital signs in the ED  were notable for the following: Temperature max 99.4; heart rate 85-1 10; stop blood pressures in the 120s 150s; respiratory rate 14-18, oxygen saturation 95 to 100% on room air.  Labs were notable for the following: CMP notable for the following: Sodium 135, potassium 3.2, Cardide 22, creatinine 1.53, chemistries prior serum creatinine of 1.39 on 06/03/2022, glucose 176, liver enzymes within normal limits.  High-sensitivity troponin I initially 49, with repeat value trending up to 675, in the absence of any prior high sensitive troponin I data points available to comparison.  CBC notable for white cell count 16,600, hemoglobin 13.3.  Lactic acid is ordered, others are currently pending.  Blood cultures x 2 collected prior to initiation of IV antibiotics.  COVID, influenza, RSV PCR were all negative.  Per my interpretation, EKG in ED demonstrated the following: Sinus rhythm with heart rate 82, normal intervals, no evidence of T wave or ST changes, including no evidence of ST elevation.  Imaging and additional notable ED work-up: 2 view chest x-ray, per formal radiology read showed evidence of right upper lobe airspace opacity consistent with infiltrate is suggestive of pneumonia, in the absence of evidence of edema, effusion, or pneumothorax.  In the context of the patient's atypical chest pain with elevated troponin, EDP contacted on-call cardiology fellow, with additional recommendations pending at this time.  While in the ED, the following were administered: Aspirin 162 mg p.o. x 1, azithromycin, Rocephin, normal symptoms 500 cc bolus.  Subsequently, the patient was admitted for further evaluation management of severe sepsis due to community-acquired pneumonia, with presentation also notable for mildly elevated troponin and atypical chest discomfort.  ***red    Review of Systems: As per HPI  otherwise 10 point review of systems negative.   Past Medical History:  Diagnosis Date  . Allergy     seasonal  . Anxiety   . Depression   . Headache   . Hepatitis C   . HLD (hyperlipidemia) 08/09/2017  . Hypertension   . Hypoglycemia   . Stroke [facility])     Past Surgical History:  Procedure Laterality Date  . APPENDECTOMY    . BUBBLE STUDY  06/16/2021   Procedure: BUBBLE STUDY;  Surgeon: [name], MD;  Location: [place];  Service: Cardiovascular;;  . TEE WITHOUT CARDIOVERSION N/A 06/16/2021   Procedure: TRANSESOPHAGEAL ECHOCARDIOGRAM (TEE);  Surgeon: [name], MD;  Location: [place];  Service: Cardiovascular;  Laterality: N/A;  . TONSILECTOMY, ADENOIDECTOMY, BILATERAL MYRINGOTOMY AND TUBES      Social History:  reports that he has quit smoking. He has never used smokeless tobacco. He reports that he does not drink alcohol and does not use drugs.   No Known Allergies  Family History  Problem Relation Age of Onset  . Lung disease Mother   . Hyperlipidemia Father   . Hypertension Father     Family history reviewed and not pertinent    Prior to Admission medications   Medication Sig Start Date End Date Taking? Authorizing Provider  atorvastatin (LIPITOR) 40 MG tablet Take 1 tablet (40 mg total) by mouth daily. 01/28/22   Biagio Borg, MD  cholecalciferol (VITAMIN D3) 25 MCG (1000 UNIT) tablet Take 1 tablet (1,000 Units total) by mouth daily. Take 1,000 Units by mouth daily. 11/22/21   Biagio Borg, MD  clopidogrel (PLAVIX) 75 MG tablet Take 1 tablet (75 mg total) by mouth daily. 03/29/22   Biagio Borg, MD  fenofibrate 160 MG tablet Take 1 tablet (160 mg total) by mouth daily. 05/11/22   Biagio Borg, MD  fluticasone [facility]) 50 MCG/ACT nasal spray USE 2 SPRAYS IN [facility] NOSTRIL DAILY 11/22/21   Biagio Borg, MD  gabapentin (NEURONTIN) 300 MG capsule TAKE 1 CAPSULE BY MOUTH EVERY  NIGHT AT BEDTIME 01/31/22   Biagio Borg, MD  ibuprofen (ADVIL) 200 MG tablet Take 200 mg by mouth every 6 (six) hours as needed for mild pain.    [provider]  losartan (COZAAR) 100 MG tablet Take 1 tablet (100 mg total) by mouth daily. 12/01/21   Biagio Borg, MD  metFORMIN (GLUCOPHAGE-XR) 500 MG 24 hr tablet Take 1 tablet (500 mg total) by mouth daily with breakfast. 12/03/21   Biagio Borg, MD  Multiple Vitamins-Minerals (HAIR/SKIN/NAILS/BIOTIN PO) Take 1 tablet by mouth daily.    [provider]  PARoxetine (PAXIL) 40 MG tablet TAKE 1 TABLET(40 MG) BY MOUTH EVERY MORNING 12/31/21   Biagio Borg, MD  QUEtiapine (SEROQUEL) 50 MG tablet TAKE 1 TABLET(50 MG) BY MOUTH TWICE DAILY 01/28/22   Biagio Borg, MD  sildenafil (VIAGRA) 100 MG tablet Take 0.5-1 tablets (50-100 mg total) by mouth daily as needed for erectile dysfunction. 06/03/22   Biagio Borg, MD  vitamin E 180 MG (400 UNITS) capsule Take 400 Units by mouth daily.    [provider]  zolpidem (AMBIEN) 10 MG tablet TAKE 1 TABLET(10 MG) BY MOUTH AT BEDTIME AS NEEDED FOR SLEEP 03/29/22   Biagio Borg, MD     Objective    Physical Exam: Vitals:   07/08/22 1949 07/08/22 2156 07/08/22 2159 07/08/22 2200  BP: 123/68 (!) 142/94    Pulse:  84 89 86 84  Resp: '18 13 14 16  '$ Temp: 98.1 F (36.7 C) 99.1 F (37.3 C)    TempSrc: Oral Oral    SpO2: 98% 100% 100% 100%  Weight:      Height:        General: appears to be stated age; alert, oriented Skin: warm, dry, no rash Head:  AT/[place] Mouth:  Oral mucosa membranes appear moist, normal dentition Neck: supple; trachea midline Heart:  RRR; did not appreciate any M/R/G Lungs: CTAB, did not appreciate any wheezes, rales, or rhonchi Abdomen: + BS; soft, ND, NT Vascular: 2+ pedal pulses b/l; 2+ radial pulses b/l Extremities: no peripheral edema, no muscle wasting Neuro: strength and sensation intact in upper and lower extremities b/l     Labs on Admission: I have personally reviewed following labs and imaging studies  CBC: Recent Labs  Lab 07/08/22 1811  WBC 16.6*  HGB 13.3  HCT 38.7*  MCV 92.1  PLT  [id number]*   Basic Metabolic Panel: Recent Labs  Lab 07/08/22 1811 07/08/22 2025  NA 135  --   K 3.9  --   CL 102  --   CO2 22  --   GLUCOSE 176*  --   BUN 18  --   CREATININE 1.52*  --   CALCIUM 9.4  --   MG  --  2.1   GFR: Estimated Creatinine Clearance: 44.7 mL/min (A) (by C-G formula based on SCr of 1.52 mg/dL (H)). Liver Function Tests: Recent Labs  Lab 07/08/22 1811  AST 41  ALT 22  ALKPHOS 81  BILITOT 0.8  PROT 6.5  ALBUMIN 3.7   No results for input(s): "LIPASE", "AMYLASE" in the last 168 hours. No results for input(s): "AMMONIA" in the last 168 hours. Coagulation Profile: No results for input(s): "INR", "PROTIME" in the last 168 hours. Cardiac Enzymes: No results for input(s): "CKTOTAL", "CKMB", "CKMBINDEX", "TROPONINI" in the last 168 hours. BNP (last 3 results) No results for input(s): "PROBNP" in the last 8760 hours. HbA1C: No results for input(s): "HGBA1C" in the last 72 hours. CBG: No results for input(s): "GLUCAP" in the last 168 hours. Lipid Profile: No results for input(s): "CHOL", "HDL", "LDLCALC", "TRIG", "CHOLHDL", "LDLDIRECT" in the last 72 hours. Thyroid Function Tests: No results for input(s): "TSH", "T4TOTAL", "FREET4", "T3FREE", "THYROIDAB" in the last 72 hours. Anemia Panel: No results for input(s): "VITAMINB12", "FOLATE", "FERRITIN", "TIBC", "IRON", "RETICCTPCT" in the last 72 hours. Urine analysis:    Component Value Date/Time   COLORURINE YELLOW 06/03/2022 0849   APPEARANCEUR CLEAR 06/03/2022 0849   LABSPEC 1.020 06/03/2022 0849   PHURINE 7.0 06/03/2022 [place] 06/03/2022 0849   HGBUR NEGATIVE 06/03/2022 0849   BILIRUBINUR NEGATIVE 06/03/2022 0849   KETONESUR NEGATIVE 06/03/2022 0849   UROBILINOGEN 0.2 06/03/2022 0849   NITRITE NEGATIVE 06/03/2022 0849   LEUKOCYTESUR NEGATIVE 06/03/2022 0849    Radiological Exams on Admission: DG Chest 2 View  Result Date: 07/08/2022 CLINICAL DATA:  Chest pain.  Fever.  Flu  symptoms. EXAM: CHEST - 2 VIEW COMPARISON:  03/10/2021 FINDINGS: The heart size and mediastinal contours are within normal limits. Elevation of right hemidiaphragm again noted. Mild asymmetric patchy opacity is seen in the right upper lobe which appears new, suspicious for pneumonia. Evidence of pleural effusion. IMPRESSION: Mild asymmetric patchy opacity in right upper lobe, suspicious for pneumonia. Recommend continued chest radiographic follow-up to confirm resolution. Electronically Signed   By: Marlaine Hind M.D.   On: 07/08/2022 18:48  Assessment/Plan   Principal Problem:   CAP (community acquired pneumonia)   ***         #) Severe sepsis due to community-acquired pneumonia: Diagnosis on the basis of 1 day of new onset subjective fever, chills, nonproductive cough, atypical chest pain, and chest x-ray showing evidence of right upper lobe infiltrate consistent with pneumonia.   SIRS criteria met via presenting leukocytosis, tachycardia. Lactic acid level: Currently pending. Of note, given the associated presence of suspected end organ damage in the form of concominant presenting elevated troponin, criteria are met for pt's sepsis to be considered severe in nature. However, in the absence of lactic acid level that is greater than or equal to 4.0, and in the absence of any associated hypotension refractory to IVF's, there are no indications for administration of a 30 mL/kg IVF bolus at this time.   Additional ED work-up/management notable for: Collection of blood cultures x 2 which occurred prior to initiation of azithromycin and Rocephin, which will be continued for community-acquired pneumonia coverage.  No e/o additional infectious process at this time, including COVID, RSV, will once PCR which were all negative.  Also check urinalysis.   Plan: CBC w/ diff and CMP in AM.  Follow for results of blood cx's x 2. Abx: Continue Rocephin and azithromycin, as above.  Stat lactic acid,  as above.  Add on procalcitonin level.  Check strep pneumonia urine antigen.  Check urinalysis.  This is [place].            #) Atypical chest pain: 1 day of intermittent right-sided, sharp, chest discomfort, worse with cough or deep inspiration, coinciding with evidence of right upper lobe infiltrate on chest x-ray, suggestive of presenting community-acquired pneumonia, as above.             ***            ***            ***            ***             ***           ***           ***   ***  DVT prophylaxis: SCD's ***  Code Status: Full code*** Family Communication: none*** Disposition Plan: Per Rounding Team Consults called: none***;  Admission status: ***     I SPENT GREATER THAN 75 *** MINUTES IN CLINICAL CARE TIME/MEDICAL DECISION-MAKING IN COMPLETING THIS ADMISSION.      [place] DO [facility]  From [place]   07/08/2022, 11:53 PM   ***

## 2022-07-08 NOTE — ED Provider Notes (Signed)
[facility] Provider Note   CSN: [id number] Arrival date & time: 07/08/22  1756     History  Chief Complaint  Patient presents with   Chest Pain   Fever   flu symptoms    Collin Bridges is a 77 y.o. male.  76 year old male with prior medical history as detailed below presents for evaluation.  Patient reports that he had routine MRI of brain this morning.  During procedure he felt poorly with subjective fever, chills, body aches.  Patient reports some vague chest discomfort and mild nausea earlier today as well.  Patient's symptom has significant improved this afternoon.  Denies prior history of known cardiac disease.  He reports that he felt fine yesterday.  The history is provided by the patient and medical records.  Fever      Home Medications Prior to Admission medications   Medication Sig Start Date End Date Taking? Authorizing Provider  atorvastatin (LIPITOR) 40 MG tablet Take 1 tablet (40 mg total) by mouth daily. 01/28/22   Biagio Borg, MD  cholecalciferol (VITAMIN D3) 25 MCG (1000 UNIT) tablet Take 1 tablet (1,000 Units total) by mouth daily. Take 1,000 Units by mouth daily. 11/22/21   Biagio Borg, MD  clopidogrel (PLAVIX) 75 MG tablet Take 1 tablet (75 mg total) by mouth daily. 03/29/22   Biagio Borg, MD  fenofibrate 160 MG tablet Take 1 tablet (160 mg total) by mouth daily. 05/11/22   Biagio Borg, MD  fluticasone [facility]) 50 MCG/ACT nasal spray USE 2 SPRAYS IN [facility] NOSTRIL DAILY 11/22/21   Biagio Borg, MD  gabapentin (NEURONTIN) 300 MG capsule TAKE 1 CAPSULE BY MOUTH EVERY  NIGHT AT BEDTIME 01/31/22   Biagio Borg, MD  ibuprofen (ADVIL) 200 MG tablet Take 200 mg by mouth every 6 (six) hours as needed for mild pain.    [provider]  losartan (COZAAR) 100 MG tablet Take 1 tablet (100 mg total) by mouth daily. 12/01/21   Biagio Borg, MD  metFORMIN (GLUCOPHAGE-XR) 500 MG 24 hr tablet Take 1 tablet  (500 mg total) by mouth daily with breakfast. 12/03/21   Biagio Borg, MD  Multiple Vitamins-Minerals (HAIR/SKIN/NAILS/BIOTIN PO) Take 1 tablet by mouth daily.    [provider]  PARoxetine (PAXIL) 40 MG tablet TAKE 1 TABLET(40 MG) BY MOUTH EVERY MORNING 12/31/21   Biagio Borg, MD  QUEtiapine (SEROQUEL) 50 MG tablet TAKE 1 TABLET(50 MG) BY MOUTH TWICE DAILY 01/28/22   Biagio Borg, MD  sildenafil (VIAGRA) 100 MG tablet Take 0.5-1 tablets (50-100 mg total) by mouth daily as needed for erectile dysfunction. 06/03/22   Biagio Borg, MD  vitamin E 180 MG (400 UNITS) capsule Take 400 Units by mouth daily.    [provider]  zolpidem (AMBIEN) 10 MG tablet TAKE 1 TABLET(10 MG) BY MOUTH AT BEDTIME AS NEEDED FOR SLEEP 03/29/22   Biagio Borg, MD      Allergies    Patient has no known allergies.    Review of Systems   Review of Systems  All other systems reviewed and are negative.   Physical Exam Updated Vital Signs BP (!) 142/94 (BP Location: Left Arm)   Pulse 84   Temp 99.1 F (37.3 C) (Oral)   Resp 16   Ht 6' (1.829 m)   Wt 81.6 kg   SpO2 100%   BMI 24.41 kg/m  Physical Exam Vitals and nursing  note reviewed.  Constitutional:      General: He is not in acute distress.    Appearance: Normal appearance. He is well-developed.  HENT:     Head: Normocephalic and atraumatic.  Eyes:     Conjunctiva/sclera: Conjunctivae normal.     Pupils: Pupils are equal, round, and reactive to light.  Cardiovascular:     Rate and Rhythm: Normal rate and regular rhythm.     Heart sounds: Normal heart sounds.  Pulmonary:     Effort: Pulmonary effort is normal. No respiratory distress.     Breath sounds: Normal breath sounds.  Abdominal:     General: There is no distension.     Palpations: Abdomen is soft.     Tenderness: There is no abdominal tenderness.  Musculoskeletal:        General: No deformity. Normal range of motion.     Cervical back: Normal range of motion and neck  supple.  Skin:    General: Skin is warm and dry.  Neurological:     General: No focal deficit present.     Mental Status: He is alert and oriented to person, place, and time.     ED Results / Procedures / Treatments   Labs (all labs ordered are listed, but only abnormal results are displayed) Labs Reviewed  CBC - Abnormal; Notable for the following components:      Result Value   WBC 16.6 (*)    RBC 4.20 (*)    HCT 38.7 (*)    Platelets 121 (*)    All other components within normal limits  COMPREHENSIVE METABOLIC PANEL - Abnormal; Notable for the following components:   Glucose, Bld 176 (*)    Creatinine, Ser 1.52 (*)    GFR, Estimated 47 (*)    All other components within normal limits  TROPONIN I (HIGH SENSITIVITY) - Abnormal; Notable for the following components:   Troponin I (High Sensitivity) 49 (*)    All other components within normal limits  TROPONIN I (HIGH SENSITIVITY) - Abnormal; Notable for the following components:   Troponin I (High Sensitivity) 675 (*)    All other components within normal limits  RESP PANEL BY RT-PCR (RSV, FLU A&B, COVID)  RVPGX2  CULTURE, BLOOD (ROUTINE X 2)  CULTURE, BLOOD (ROUTINE X 2)  LACTIC ACID, PLASMA  LACTIC ACID, PLASMA    EKG EKG Interpretation  Date/Time:  Friday July 08 2022 21:55:21 EST Ventricular Rate:  82 PR Interval:  156 QRS Duration: 95 QT Interval:  379 QTC Calculation: 443 R Axis:   14 Text Interpretation: Sinus rhythm Consider left atrial enlargement Baseline wander in lead(s) V5 Confirmed by Dene Gentry [phone]) on 07/08/2022 10:25:01 PM  Radiology DG Chest 2 View  Result Date: 07/08/2022 CLINICAL DATA:  Chest pain.  Fever.  Flu symptoms. EXAM: CHEST - 2 VIEW COMPARISON:  03/10/2021 FINDINGS: The heart size and mediastinal contours are within normal limits. Elevation of right hemidiaphragm again noted. Mild asymmetric patchy opacity is seen in the right upper lobe which appears new, suspicious for  pneumonia. Evidence of pleural effusion. IMPRESSION: Mild asymmetric patchy opacity in right upper lobe, suspicious for pneumonia. Recommend continued chest radiographic follow-up to confirm resolution. Electronically Signed   By: Marlaine Hind M.D.   On: 07/08/2022 18:48    Procedures Procedures    Medications Ordered in ED Medications  cefTRIAXone (ROCEPHIN) 1 g in sodium chloride 0.9 % 100 mL IVPB (has no administration in time range)  azithromycin (ZITHROMAX) 500  mg in sodium chloride 0.9 % 250 mL IVPB (has no administration in time range)  sodium chloride 0.9 % bolus 500 mL (has no administration in time range)  aspirin chewable tablet 162 mg (has no administration in time range)    ED Course/ Medical Decision Making/ A&P                             Medical Decision Making Amount and/or Complexity of Data Reviewed Labs: ordered.  Risk OTC drugs. Decision regarding hospitalization.    Medical Screen Complete  This patient presented to the ED with complaint of fever, chills, body aches, nausea, chest discomfort  This complaint involves an extensive number of treatment options. The initial differential diagnosis includes, but is not limited to, infection, ACS, metabolic abnormality, etc.  This presentation is: Acute, Self-Limited, Previously Undiagnosed, Uncertain Prognosis, Complicated, Systemic Symptoms, and Threat to Life/Bodily Function  Patient is presenting with complaint of subjective fever, chills, body aches.  Patient also reports some vague nausea and vague chest discomfort earlier this morning.  At time of evaluation the patient feels significantly improved.  Patient denies active chest pain.  Workup is suggestive of possible pneumonia with infiltrate seen on chest x-ray.  Patient with elevated white count.  Patient's troponin is also elevated to 675.  Case discussed with cardiology Fellow.  He agrees with plan to continue to cycle troponin in the setting of  likely pneumonia.   He does not feel the patient requires heparinization.  Hospitalist service is aware of case and will evaluate for admission.  Additional history obtained:  External records from outside sources obtained and reviewed including prior ED visits and prior Inpatient records.    Lab Tests:  I ordered and personally interpreted labs.  The pertinent results include: CBC, CMP, troponin x 2, lactic acid, COVID, flu   Imaging Studies ordered:  I ordered imaging studies including chest x-ray I independently visualized and interpreted obtained imaging which showed right upper lobe infiltrate I agree with the radiologist interpretation.   Cardiac Monitoring:  The patient was maintained on a cardiac monitor.  I personally viewed and interpreted the cardiac monitor which showed an underlying rhythm of: NSR   Medicines ordered:  I ordered medication including antibiotics, ASA for infection, elevated troponin Reevaluation of the patient after these medicines showed that the patient: improved   Problem List / ED Course:  Elevated troponin, likely pneumonia   Reevaluation:  After the interventions noted above, I reevaluated the patient and found that they have: improved  Disposition:  After consideration of the diagnostic results and the patients response to treatment, I feel that the patent would benefit from admission.          Final Clinical Impression(s) / ED Diagnoses Final diagnoses:  Chest pain, unspecified type  Pneumonia due to infectious organism, unspecified laterality, unspecified part of lung    Rx / DC Orders ED Discharge Orders     None         Valarie Merino, MD 07/08/22 2304

## 2022-07-09 ENCOUNTER — Other Ambulatory Visit: Payer: Self-pay

## 2022-07-09 ENCOUNTER — Inpatient Hospital Stay (HOSPITAL_COMMUNITY): Payer: 59

## 2022-07-09 DIAGNOSIS — I214 Non-ST elevation (NSTEMI) myocardial infarction: Secondary | ICD-10-CM | POA: Diagnosis not present

## 2022-07-09 DIAGNOSIS — R6520 Severe sepsis without septic shock: Secondary | ICD-10-CM | POA: Diagnosis present

## 2022-07-09 DIAGNOSIS — I25118 Atherosclerotic heart disease of native coronary artery with other forms of angina pectoris: Secondary | ICD-10-CM | POA: Diagnosis not present

## 2022-07-09 DIAGNOSIS — R7989 Other specified abnormal findings of blood chemistry: Secondary | ICD-10-CM | POA: Diagnosis present

## 2022-07-09 DIAGNOSIS — M7989 Other specified soft tissue disorders: Secondary | ICD-10-CM

## 2022-07-09 DIAGNOSIS — J309 Allergic rhinitis, unspecified: Secondary | ICD-10-CM | POA: Diagnosis present

## 2022-07-09 DIAGNOSIS — R079 Chest pain, unspecified: Secondary | ICD-10-CM | POA: Diagnosis present

## 2022-07-09 DIAGNOSIS — A419 Sepsis, unspecified organism: Secondary | ICD-10-CM | POA: Diagnosis present

## 2022-07-09 DIAGNOSIS — R0789 Other chest pain: Secondary | ICD-10-CM | POA: Diagnosis present

## 2022-07-09 DIAGNOSIS — I6310 Cerebral infarction due to embolism of unspecified precerebral artery: Secondary | ICD-10-CM | POA: Diagnosis not present

## 2022-07-09 DIAGNOSIS — I5032 Chronic diastolic (congestive) heart failure: Secondary | ICD-10-CM | POA: Diagnosis present

## 2022-07-09 DIAGNOSIS — F32A Depression, unspecified: Secondary | ICD-10-CM | POA: Diagnosis present

## 2022-07-09 DIAGNOSIS — E1165 Type 2 diabetes mellitus with hyperglycemia: Secondary | ICD-10-CM | POA: Diagnosis not present

## 2022-07-09 DIAGNOSIS — J189 Pneumonia, unspecified organism: Secondary | ICD-10-CM | POA: Diagnosis not present

## 2022-07-09 DIAGNOSIS — I639 Cerebral infarction, unspecified: Secondary | ICD-10-CM | POA: Diagnosis not present

## 2022-07-09 LAB — ECHOCARDIOGRAM COMPLETE
AR max vel: 1.16 cm2
AV Area VTI: 1.37 cm2
AV Area mean vel: 1.23 cm2
AV Mean grad: 10 mmHg
AV Peak grad: 21.5 mmHg
Ao pk vel: 2.32 m/s
Area-P 1/2: 4.31 cm2
Height: 72 in
S' Lateral: 3.6 cm
Weight: 2880 oz

## 2022-07-09 LAB — RESPIRATORY PANEL BY PCR

## 2022-07-09 LAB — GLUCOSE, CAPILLARY: Glucose-Capillary: 153 mg/dL — ABNORMAL HIGH (ref 70–99)

## 2022-07-09 LAB — CBC WITH DIFFERENTIAL/PLATELET
Abs Immature Granulocytes: 0.03 10*3/uL (ref 0.00–0.07)
Basophils Absolute: 0 10*3/uL (ref 0.0–0.1)
Basophils Relative: 0 %
Eosinophils Absolute: 0.4 10*3/uL (ref 0.0–0.5)
Eosinophils Relative: 4 %
HCT: 36.8 % — ABNORMAL LOW (ref 39.0–52.0)
Hemoglobin: 12.7 g/dL — ABNORMAL LOW (ref 13.0–17.0)
Immature Granulocytes: 0 %
Lymphocytes Relative: 15 %
Lymphs Abs: 1.7 10*3/uL (ref 0.7–4.0)
MCH: 32.2 pg (ref 26.0–34.0)
MCHC: 34.5 g/dL (ref 30.0–36.0)
MCV: 93.2 fL (ref 80.0–100.0)
Monocytes Absolute: 0.6 10*3/uL (ref 0.1–1.0)
Monocytes Relative: 6 %
Neutro Abs: 8.1 10*3/uL — ABNORMAL HIGH (ref 1.7–7.7)
Neutrophils Relative %: 75 %
Platelets: 99 10*3/uL — ABNORMAL LOW (ref 150–400)
RBC: 3.95 MIL/uL — ABNORMAL LOW (ref 4.22–5.81)
RDW: 12.9 % (ref 11.5–15.5)
WBC: 10.8 10*3/uL — ABNORMAL HIGH (ref 4.0–10.5)
nRBC: 0 % (ref 0.0–0.2)

## 2022-07-09 LAB — COMPREHENSIVE METABOLIC PANEL
ALT: 20 U/L (ref 0–44)
AST: 34 U/L (ref 15–41)
Albumin: 3.3 g/dL — ABNORMAL LOW (ref 3.5–5.0)
Alkaline Phosphatase: 72 U/L (ref 38–126)
Anion gap: 8 (ref 5–15)
BUN: 16 mg/dL (ref 8–23)
CO2: 25 mmol/L (ref 22–32)
Calcium: 8.8 mg/dL — ABNORMAL LOW (ref 8.9–10.3)
Chloride: 107 mmol/L (ref 98–111)
Creatinine, Ser: 1.33 mg/dL — ABNORMAL HIGH (ref 0.61–1.24)
GFR, Estimated: 55 mL/min — ABNORMAL LOW (ref >60)
Glucose, Bld: 138 mg/dL — ABNORMAL HIGH (ref 70–99)
Potassium: 3.6 mmol/L (ref 3.5–5.1)
Sodium: 140 mmol/L (ref 135–145)
Total Bilirubin: 0.7 mg/dL (ref 0.3–1.2)
Total Protein: 5.8 g/dL — ABNORMAL LOW (ref 6.5–8.1)

## 2022-07-09 LAB — CBG MONITORING, ED
Glucose-Capillary: 110 mg/dL — ABNORMAL HIGH (ref 70–99)
Glucose-Capillary: 106 mg/dL — ABNORMAL HIGH (ref 70–99)
Glucose-Capillary: 132 mg/dL — ABNORMAL HIGH (ref 70–99)

## 2022-07-09 LAB — MAGNESIUM: Magnesium: 2.1 mg/dL (ref 1.7–2.4)

## 2022-07-09 LAB — LIPASE, BLOOD: Lipase: 50 U/L (ref 11–51)

## 2022-07-09 LAB — PROCALCITONIN: Procalcitonin: 0.33 ng/mL

## 2022-07-09 LAB — TROPONIN I (HIGH SENSITIVITY): Troponin I (High Sensitivity): 1286 ng/L (ref <18)

## 2022-07-09 MED ORDER — ASPIRIN 81 MG PO TBEC
81.0000 mg | DELAYED_RELEASE_TABLET | Freq: Every day | ORAL | Status: DC
  Administered 2022-07-09 – 2022-07-13 (×5): 81 mg via ORAL
  Filled 2022-07-09 (×5): qty 1

## 2022-07-09 MED ORDER — PNEUMOCOCCAL 20-VAL CONJ VACC 0.5 ML IM SUSY
0.5000 mL | PREFILLED_SYRINGE | INTRAMUSCULAR | Status: AC
  Administered 2022-07-12: 0.5 mL via INTRAMUSCULAR
  Filled 2022-07-09: qty 0.5

## 2022-07-09 MED ORDER — METOPROLOL TARTRATE 25 MG PO TABS
25.0000 mg | ORAL_TABLET | Freq: Two times a day (BID) | ORAL | Status: DC
  Administered 2022-07-09: 25 mg via ORAL
  Filled 2022-07-09: qty 1

## 2022-07-09 MED ORDER — IOHEXOL 350 MG/ML SOLN
75.0000 mL | Freq: Once | INTRAVENOUS | Status: AC | PRN
  Administered 2022-07-09: 75 mL via INTRAVENOUS

## 2022-07-09 MED ORDER — POTASSIUM CHLORIDE CRYS ER 20 MEQ PO TBCR
40.0000 meq | EXTENDED_RELEASE_TABLET | Freq: Once | ORAL | Status: AC
  Administered 2022-07-09: 40 meq via ORAL
  Filled 2022-07-09: qty 2

## 2022-07-09 MED ORDER — HEPARIN (PORCINE) 25000 UT/250ML-% IV SOLN
1300.0000 [IU]/h | INTRAVENOUS | Status: DC
  Administered 2022-07-09: 1100 [IU]/h via INTRAVENOUS
  Administered 2022-07-10: 1350 [IU]/h via INTRAVENOUS
  Administered 2022-07-11 (×2): 1300 [IU]/h via INTRAVENOUS
  Filled 2022-07-09 (×4): qty 250

## 2022-07-09 NOTE — Progress Notes (Signed)
PROGRESS NOTE    Collin Bridges  [id number] DOB: 10-15-1945 DOA: 07/08/2022 PCP: [name], MD   Chief Complaint  Patient presents with   Chest Pain   Fever   flu symptoms    Brief Narrative:   Collin Bridges is a 77 y.o. male with medical history significant for chronic diastolic heart failure, mild aortic stenosis, CKD 3 ACEs with baseline creatinine 1.3-1.5, type 2 diabetes mellitus, hypertension, hyperlipidemia, who presents to ED secondary to complaints of fever, chills, flu symptoms and chest pain, imaging significant for Pneumonia, he was started on empiric antibiotic coverage, troponins keep trending up, he does endorse pleuritic chest pain, so CTA chest was obtained which was negative for PE, but significant for lack of opacification and pulmonary veins extending into the left atrium suspicious for pulmonary vein thrombosis, patient had MRI brain done as an outpatient 07/08/2022 which was significant for thromboembolic CVA.  Assessment & Plan:   Principal Problem:   CAP (community acquired pneumonia) Active Problems:   Essential hypertension   Mild aortic stenosis   Hyperlipidemia   Stage 3a chronic kidney disease (CKD) (HCC)   DM2 (diabetes mellitus, type 2) (HCC)   Severe sepsis (HCC)   Atypical chest pain   Elevated troponin   Depression   Allergic rhinitis   Chronic diastolic CHF (congestive heart failure) ([place])   Questionable pulmonary vein thrombosis/atherothrombosis -As evident on CTA chest, as well presence of thromboembolic CVAs support this diagnosis. -Will start on full anticoagulation, discussed with neurology, okay to start heparin GTT low-dose no bolus protocol -Will obtain venous Dopplers given asymmetric edema and lower extremity  Acute CVA -MRI brain significant for acute/subacute multiple CVAs in both hemispheres, this is most likely due to above, started on full anticoagulation, neurology consulted  Chest pain pain  with elevated troponins -2D echo with a preserved EF but with regional wall motion abnormalit. -Received aspirin. -On heparin gtt. due to above -Continue with statin, fenofibrate, Cozaar  -Urology consulted regarding further recommendations  Sepsis secondary to pneumonia -continue with IV Rocephin and azithromycin -Follow on blood cultures  History of aortic stenosis -Cardiology on board  Type 2 diabetes mellitus -A1c 6.3 last January -Continue with insulin sliding scale  Depression -continue with Paxil  Hyperlipidemia -Continue with home statin  Hypertension -Blood pressure acceptable, continue with losartan  Pulm nodules -Will need repeat imaging in 8 to 12 weeks  Hepatic cirrhosis Thrombocytopenia -Is followed by Dr. Candis Schatz, known history of hep C treated with interferon in 1999   DVT prophylaxis: Heparin GTT Code Status: Full Family Communication: Significant other at bedside Disposition:   Status is: Inpatient    Consultants:  Cardiology Neurology   Subjective:  He reports pleuritic chest pain yesterday but not today, report dyspnea much improved.  Objective: Vitals:   07/09/22 0915 07/09/22 0955 07/09/22 1258 07/09/22 1315  BP: (!) 169/90  (!) 154/77 (!) 145/107  Pulse: 87 81 89 97  Resp: '16 18 20 16  '$ Temp:   (!) 97.5 F (36.4 C)   TempSrc:      SpO2: 100% 100% 100% 99%  Weight:      Height:        Intake/Output Summary (Last 24 hours) at 07/09/2022 1402 Last data filed at 07/09/2022 0155 Gross per 24 hour  Intake 500 ml  Output --  Net 500 ml   Filed Weights   07/08/22 1802  Weight: 81.6 kg    Examination:  Awake Alert, Oriented X 3, No  new F.N deficits, Normal affect Symmetrical Chest wall movement, Good air movement bilaterally, CTAB RRR,No Gallops,Rubs or new Murmurs, No Parasternal Heave +ve B.Sounds, Abd Soft, No tenderness, No rebound - guarding or rigidity. No Cyanosis, Clubbing, bilateral lower extremity edema,  right> left   Data Reviewed: I have personally reviewed following labs and imaging studies  CBC: Recent Labs  Lab 07/08/22 1811 07/09/22 0456  WBC 16.6* 10.8*  NEUTROABS  --  8.1*  HGB 13.3 12.7*  HCT 38.7* 36.8*  MCV 92.1 93.2  PLT 121* 99*    Basic Metabolic Panel: Recent Labs  Lab 07/08/22 1811 07/08/22 2025 07/09/22 0456  NA 135  --  140  K 3.9  --  3.6  CL 102  --  107  CO2 22  --  25  GLUCOSE 176*  --  138*  BUN 18  --  16  CREATININE 1.52*  --  1.33*  CALCIUM 9.4  --  8.8*  MG  --  2.1 2.1    GFR: Estimated Creatinine Clearance: 51.1 mL/min (A) (by C-G formula based on SCr of 1.33 mg/dL (H)).  Liver Function Tests: Recent Labs  Lab 07/08/22 1811 07/09/22 0456  AST 41 34  ALT 22 20  ALKPHOS 81 72  BILITOT 0.8 0.7  PROT 6.5 5.8*  ALBUMIN 3.7 3.3*    CBG: Recent Labs  Lab 07/09/22 0753 07/09/22 1227  GLUCAP 110* 106*     Recent Results (from the past 240 hour(s))  Resp panel by RT-PCR (RSV, Flu A&B, Covid) Anterior Nasal Swab     Status: None   Collection Time: 07/08/22  6:08 PM   Specimen: Anterior Nasal Swab  Result Value Ref Range Status   SARS Coronavirus 2 by RT PCR NEGATIVE NEGATIVE Final   Influenza A by PCR NEGATIVE NEGATIVE Final   Influenza B by PCR NEGATIVE NEGATIVE Final    Comment: (NOTE) The Xpert Xpress SARS-CoV-2/FLU/RSV plus assay is intended as an aid in the diagnosis of influenza from Nasopharyngeal swab specimens and should not be used as a sole basis for treatment. Nasal washings and aspirates are unacceptable for Xpert Xpress SARS-CoV-2/FLU/RSV testing.  Fact Sheet for Patients: EntrepreneurPulse.com.au  Fact Sheet for Healthcare Providers: IncredibleEmployment.be  This test is not yet approved or cleared by the [place] FDA and has been authorized for detection and/or diagnosis of SARS-CoV-2 by FDA under an Emergency Use Authorization (EUA). This EUA will remain in  effect (meaning this test can be used) for the duration of the COVID-19 declaration under Section 564(b)(1) of the Act, 21 U.S.C. section 360bbb-3(b)(1), unless the authorization is terminated or revoked.     Resp Syncytial Virus by PCR NEGATIVE NEGATIVE Final    Comment: (NOTE) Fact Sheet for Patients: EntrepreneurPulse.com.au  Fact Sheet for Healthcare Providers: IncredibleEmployment.be  This test is not yet approved or cleared by the [place] FDA and has been authorized for detection and/or diagnosis of SARS-CoV-2 by FDA under an Emergency Use Authorization (EUA). This EUA will remain in effect (meaning this test can be used) for the duration of the COVID-19 declaration under Section 564(b)(1) of the Act, 21 U.S.C. section 360bbb-3(b)(1), unless the authorization is terminated or revoked.  Performed at [facility], [place] [address], [place], [place] [address]   Culture, blood (routine x 2)     Status: None (Preliminary result)   Collection Time: 07/08/22 10:24 PM   Specimen: BLOOD  Result Value Ref Range Status   Specimen Description BLOOD BLOOD  RIGHT HAND  Final   Special Requests   Final    BOTTLES DRAWN AEROBIC AND ANAEROBIC Blood Culture results may not be optimal due to an inadequate volume of blood received in culture bottles   Culture   Final    NO GROWTH < 12 HOURS Performed at [facility] [address], [place], [place] [address]    Report Status PENDING  Incomplete  Culture, blood (routine x 2)     Status: None (Preliminary result)   Collection Time: 07/08/22 11:15 PM   Specimen: BLOOD  Result Value Ref Range Status   Specimen Description BLOOD SITE NOT SPECIFIED  Final   Special Requests   Final    BOTTLES DRAWN AEROBIC AND ANAEROBIC Blood Culture results may not be optimal due to an inadequate volume of blood received in culture bottles   Culture   Final    NO GROWTH < 12 HOURS Performed at [facility], [place] [address] [place], [place] [address]    Report Status PENDING  Incomplete         Radiology Studies: ECHOCARDIOGRAM COMPLETE  Result Date: 07/09/2022    ECHOCARDIOGRAM REPORT   Patient Name:   JEDIAH GALLIS Date of Exam: 07/09/2022 Medical Rec #:  [id number]             Height:       72.0 in Accession #:    [id number]            Weight:       180.0 lb Date of Birth:  October 25, 1945            BSA:          2.037 m Patient Age:    67 years              BP:           161/85 mmHg Patient Gender: M                     HR:           89 bpm. Exam Location:  Inpatient Procedure: 2D Echo Indications:    elevated troponin  History:        Patient has prior history of Echocardiogram examinations, most                 recent 06/16/2021. CHF, chronic kidney disease. Sepsis.; Risk                 Factors:Diabetes, Hypertension and Dyslipidemia.  Sonographer:    Johny Chess RDCS Referring Phys: [id number] Rhetta Mura  Sonographer Comments: Image acquisition challenging due to respiratory motion. IMPRESSIONS  1. Left ventricular ejection fraction, by estimation, is 60 to 65%. The left ventricle has normal function. The left ventricle demonstrates regional wall motion abnormalities (see scoring diagram/findings for description). Left ventricular diastolic parameters are consistent with Grade I diastolic dysfunction (impaired relaxation).  2. Right ventricular systolic function is hyperdynamic. The right ventricular size is mildly enlarged.  3. Calcified subchordal appatatus and previously describev. The mitral valve is abnormal. Trivial mitral valve regurgitation. No evidence of mitral stenosis. Moderate mitral annular calcification.  4. The aortic valve is abnormal. Aortic valve regurgitation is not visualized. Mild aortic valve stenosis.  5. The pulmonary veins are not sufficiently imaged to evalution for pulmonary vein thrombus based off of this study. FINDINGS  Left Ventricle: Left  ventricular ejection fraction, by estimation, is 60 to  65%. The left ventricle has normal function. The left ventricle demonstrates regional wall motion abnormalities. The left ventricular internal cavity size was normal in size. There is no left ventricular hypertrophy. Left ventricular diastolic parameters are consistent with Grade I diastolic dysfunction (impaired relaxation).  LV Wall Scoring: The basal inferior segment is hypokinetic. Right Ventricle: The right ventricular size is mildly enlarged. No increase in right ventricular wall thickness. Right ventricular systolic function is hyperdynamic. Left Atrium: Left atrial size was normal in size. Right Atrium: Right atrial size was normal in size. Pericardium: There is no evidence of pericardial effusion. Presence of epicardial fat layer. Mitral Valve: Calcified subchordal appatatus and previously describev. The mitral valve is abnormal. Moderate mitral annular calcification. Trivial mitral valve regurgitation. No evidence of mitral valve stenosis. Tricuspid Valve: The tricuspid valve is normal in structure. Tricuspid valve regurgitation is not demonstrated. No evidence of tricuspid stenosis. Aortic Valve: The aortic valve is abnormal. Aortic valve regurgitation is not visualized. Mild aortic stenosis is present. Aortic valve mean gradient measures 10.0 mmHg. Aortic valve peak gradient measures 21.5 mmHg. Aortic valve area, by VTI measures 1.37 cm. Pulmonic Valve: The pulmonary veins are not sufficiently imaged to evalution for pulmonary vein thrombus based off of this study. The pulmonic valve was not well visualized. Pulmonic valve regurgitation is not visualized. Aorta: The aortic root and ascending aorta are structurally normal, with no evidence of dilitation. Venous: The pulmonary veins are not sufficiently imaged to evalution for pulmonary vein thrombus based off of this study. IAS/Shunts: No atrial level shunt detected by color flow Doppler.  LEFT  VENTRICLE PLAX 2D LVIDd:         4.60 cm   Diastology LVIDs:         3.60 cm   LV e' medial:    8.27 cm/s LV PW:         0.90 cm   LV E/e' medial:  11.0 LV IVS:        1.00 cm   LV e' lateral:   8.81 cm/s LVOT diam:     1.90 cm   LV E/e' lateral: 10.3 LV SV:         61 LV SV Index:   30 LVOT Area:     2.84 cm  RIGHT VENTRICLE RV Basal diam:  2.70 cm RV S prime:     13.80 cm/s TAPSE (M-mode): 3.0 cm LEFT ATRIUM             Index        RIGHT ATRIUM           Index LA diam:        3.70 cm 1.82 cm/m   RA Area:     15.30 cm LA Vol (A2C):   43.1 ml 21.16 ml/m  RA Volume:   38.30 ml  18.80 ml/m LA Vol (A4C):   51.2 ml 25.13 ml/m LA Biplane Vol: 49.5 ml 24.30 ml/m  AORTIC VALVE AV Area (Vmax):    1.16 cm AV Area (Vmean):   1.23 cm AV Area (VTI):     1.37 cm AV Vmax:           232.00 cm/s AV Vmean:          152.000 cm/s AV VTI:            0.448 m AV Peak Grad:      21.5 mmHg AV Mean Grad:      10.0 mmHg LVOT Vmax:  94.85 cm/s LVOT Vmean:        65.750 cm/s LVOT VTI:          0.216 m LVOT/AV VTI ratio: 0.48  AORTA Ao Root diam: 3.50 cm Ao Asc diam:  3.70 cm MITRAL VALVE MV Area (PHT): 4.31 cm     SHUNTS MV Decel Time: 176 msec     Systemic VTI:  0.22 m MV E velocity: 90.70 cm/s   Systemic Diam: 1.90 cm MV A velocity: 116.00 cm/s MV E/A ratio:  0.78 Rudean Haskell MD Electronically signed by Rudean Haskell MD Signature Date/Time: 07/09/2022/1:08:59 PM    Final    MR BRAIN W WO CONTRAST  Result Date: 07/09/2022 CLINICAL DATA:  Study identified as missing a report during evaluation of emergent CTA chest at 12:27 pm on 07/09/2022. 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 EXAM: MRI HEAD WITHOUT AND WITH CONTRAST TECHNIQUE: Multiplanar, multiecho pulse sequences of the brain and surrounding structures were obtained without and with intravenous contrast. CONTRAST:  7.5 mL Vueway COMPARISON:  Noncontrast brain MRI 04/03/2021. FINDINGS: Brain: There are  multiple small foci of abnormal diffusion scattered in the bilateral cerebral hemispheres, and a left lentiform focus (series 3, image 76 and left periventricular white matter focus (image 82) appear to be restricted. Of note, there were similar scattered small hemispheric infarcts suspected on the previous exam in 2022. No associated hemorrhage or mass effect. Chronically advanced cerebral white matter disease with confluent T2 and FLAIR hyperintensity. Some small areas of chronic white matter cystic encephalomalacia. Chronic bilateral deep gray matter nuclei lacunar infarcts also, progressed since 2022. No definite chronic cerebral blood products. Small if any areas of chronic cortical encephalomalacia. Small homogeneously enhancing left tentorium meningioma is 19 by 15 x 7 mm (AP by transverse by CC) and does not appear significantly changed since 2022. no associated edema or significant mass effect. No other abnormal intracranial enhancement or dural thickening identified. Vascular: Major intracranial vascular flow voids are stable since 2022. Following contrast the major dural venous sinuses are enhancing and appear to be patent. Skull and upper cervical spine: Visualized bone marrow signal is within normal limits. Negative visible cervical spine. Sinuses/Orbits: Stable, negative. Other: Mastoids remain clear. Visible internal auditory structures appear normal. IMPRESSION: 1. Evidence of small recurrent acute or subacute infarcts scattered in both hemispheres, similar to that demonstrated in 2022. No associated hemorrhage or mass effect. See also emergent Chest CTA report 07/09/2022. 2. Underlying progression of chronic small vessel ischemia since 2022. 3. 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 Suspect this is clinically silent. Electronically Signed   By: Genevie Ann M.D.   On: 07/09/2022 12:38   CT Angio Chest Pulmonary Embolism (PE) W or WO Contrast  Addendum Date: 07/09/2022   ADDENDUM  REPORT: 07/09/2022 12:35 ADDENDUM: Of note the patient also underwent a brain MRI of July 08, 2022. Interpretation is pending but preliminarily review with neuroradiology shows stigmata of multiple small infarcts particularly in the LEFT cerebral hemisphere and also likely in the LEFT basal ganglia, most suggestive of embolic phenomenon. This further supports the possibility of pulmonary venous side and LEFT atrial extension of thrombus from RIGHT inferior pulmonary vein. Suggest neurologic and cardiology consultation in this complex patient who also has stigmata of liver disease which is quite severe based on hepatic morphology. Critical Value/emergent results were called by telephone at the time of interpretation on 07/09/2022 at 12:35 pm to provider Laurie Penado [facility] , who verbally acknowledged  these results. Electronically Signed   By: Zetta Bills M.D.   On: 07/09/2022 12:35   Result Date: 07/09/2022 CLINICAL DATA:  Chest pain, elevated troponin. Rule out pulmonary embolism. EXAM: CT ANGIOGRAPHY CHEST WITH CONTRAST TECHNIQUE: Multidetector CT imaging of the chest was performed using the standard protocol during bolus administration of intravenous contrast. Multiplanar CT image reconstructions and MIPs were obtained to evaluate the vascular anatomy. RADIATION DOSE REDUCTION: This exam was performed according to the departmental dose-optimization program which includes automated exposure control, adjustment of the mA and/or kV according to patient size and/or use of iterative reconstruction technique. CONTRAST:  19m OMNIPAQUE IOHEXOL 350 MG/ML SOLN COMPARISON:  Abdominal imaging from February 25, 2022 FINDINGS: Cardiovascular: Calcified and noncalcified aortic atherosclerotic plaque. No gross acute aortic process though assessment is limited by bolus timing, study performed for evaluation of pulmonary arteries. Heart size is normal without pericardial effusion or nodularity. There is three-vessel coronary  artery disease. Main pulmonary artery is opacified to 4 in 16 Hounsfield units. There is no sign of pulmonary embolism. Posterior division inferior pulmonary vein on the RIGHT and inferior pulmonary vein showing variable attenuation. There is peripheral ground-glass in there is non opacification of posterior division of the RIGHT inferior pulmonary vein seen on image 214/6. All other branches of the pulmonary venous system on the RIGHT and LEFT appear well opacified. Low-attenuation extending into the RIGHT inferior pulmonary vein and low attenuation present at the orifice of the vein in the LEFT atrium. Mediastinum/Nodes: Thoracic inlet is unremarkable. No axillary lymphadenopathy. No mediastinal or gross hilar lymphadenopathy with scattered small lymph nodes in the chest. Lungs/Pleura: Patchy ground-glass opacities some areas with distinct nodularity, for example on image 55/7 largest area measuring 7 mm discrete from a more geographic area that measures up to 5 cm in the RIGHT upper lobe. Another area of more discrete ground-glass measuring 12 x 9 mm. Patchy areas of ground-glass attenuation in posterior RIGHT lower lobe in areas subtended by the venous abnormality seen above. There is a discrete RIGHT lower lobe nodule measuring 6 mm (image 55/7) Small nodules also present at the LEFT lung base 6 mm nodule on image 90/7 and a 5 mm nodule on image 94/7. Upper Abdomen: Signs of marked cirrhotic hepatic morphology. Liver not well assessed, studies essentially a noncontrast exam in the abdomen. Spleen is moderately enlarged and not well evaluated. Imaged portions of the pancreas, adrenal glands and kidneys are unremarkable without acute gastrointestinal process seen in the upper abdomen. Musculoskeletal: No acute bone finding. No destructive bone process. Spinal degenerative changes. Review of the MIP images confirms the above findings. IMPRESSION: 1. No sign of pulmonary embolism. 2. Posterior division inferior  pulmonary vein on the RIGHT and inferior pulmonary vein showing lack of opacification. This extends to the orifice of the RIGHT inferior pulmonary vein and perhaps even into the periphery of the LEFT atrium. This is moderately suspicious for thrombus made more suspicious by the normal opacification of other pulmonary venous branches in the chest. Consider cardiology consult confirmation with venous phase assessment of the chest is suggested. 3. Multiple areas of patchy ground-glass and discrete nodularity in the RIGHT upper lobe. Findings favor an infectious or inflammatory etiology. 4. Better defined lower lobe nodules largest at 6 mm. Given the multifocal nature of findings in the chest and concern for infection but some indeterminate more discrete nodular areas with suggest 8-12 week follow-up outside of the acute setting to ensure resolution. 5. Marked hepatic cirrhosis 6. Aortic atherosclerosis  and coronary artery disease. Aortic Atherosclerosis (ICD10-I70.0). Critical Value/emergent results were called by telephone at the time of interpretation on 07/09/2022 at 12:20 pm to provider Audry Pecina [facility] , who verbally acknowledged these results. Electronically Signed: By: Zetta Bills M.D. On: 07/09/2022 12:20   DG Chest 2 View  Result Date: 07/08/2022 CLINICAL DATA:  Chest pain.  Fever.  Flu symptoms. EXAM: CHEST - 2 VIEW COMPARISON:  03/10/2021 FINDINGS: The heart size and mediastinal contours are within normal limits. Elevation of right hemidiaphragm again noted. Mild asymmetric patchy opacity is seen in the right upper lobe which appears new, suspicious for pneumonia. Evidence of pleural effusion. IMPRESSION: Mild asymmetric patchy opacity in right upper lobe, suspicious for pneumonia. Recommend continued chest radiographic follow-up to confirm resolution. Electronically Signed   By: Marlaine Hind M.D.   On: 07/08/2022 18:48        Scheduled Meds:  atorvastatin  40 mg Oral Daily   clopidogrel  75 mg  Oral Daily   fenofibrate  160 mg Oral Daily   fluticasone  1 spray Each Nare Daily   gabapentin  300 mg Oral QHS   insulin aspart  0-6 Units Subcutaneous TID WC   losartan  100 mg Oral Daily   PARoxetine  40 mg Oral Daily   QUEtiapine  50 mg Oral BID   Continuous Infusions:  azithromycin     cefTRIAXone (ROCEPHIN)  IV     heparin 1,100 Units/hr (07/09/22 1331)     LOS: 1 day      Phillips Climes, MD [facility]   To contact the attending provider between 7A-7P or the covering provider during after hours 7P-7A, please log into the web site [email or web address] and access using universal [place] password for that web site. If you do not have the password, please call the hospital operator.  07/09/2022, 2:02 PM

## 2022-07-09 NOTE — Progress Notes (Signed)
VASCULAR LAB    Bilateral lower extremity venous duplex has been performed.  See CV proc for preliminary results.   [place] Doig, RVT 07/09/2022, 4:01 PM

## 2022-07-09 NOTE — Consult Note (Addendum)
Cardiology Consultation   Patient ID: Collin Bridges MRN: [id number]; DOB: 07-09-1945  Admit date: 07/08/2022 Date of Consult: 07/09/2022  PCP:  [name], [place] Prov[facility] Cardiologist:  None        Patient Profile:   Collin Bridges is a 77 y.o. male with a hx of CVA, DM2, hypertension, hyperlipidemia, hep C, CVA, calcified mitral valve tissue (per TEE done to r/o MV mass), and CKD III, who is being seen 07/09/2022 for the evaluation of increasing troponin at the request of Dr Waldron Labs.  History of Present Illness:   [name] was last seen by Cardiology a year ago when he was being evaluated for a meningioma and an echo showed a possible mitral valve mass. This was calcified MV tissue on TEE, no further eval.   Pt was admitted 02/23 with severe sepsis 2nd CAP, met SIRS criteria. His troponins were increasing and he was having some chest pain, Cards asked to see.   [name] does not normally get chest pain.  He had started feeling bad yesterday when he was supposed to go to an MRI.  He felt weak and was having chills.  Yesterday after he left the MRI, he went home and called his doctor.  His girlfriend took his temperature and it was greater than 100.  After talking to someone from his doctor's office, he came to the ER as requested.  During that day yesterday he developed chest pain when he was not feeling well.  It reached a 6 or 7 out of 10.  Was at the lower edge of his sternum.  It did not change with deep inspiration.  As they were coming to the hospital, it started to ease off.  As he was treated in the ER, the pain resolved completely and has not returned.  He has not had it before.  He denies any recent change in his dyspnea on exertion, denies orthopnea or PND until his recent illness.  He feels much better in general now than he did when he got here.   Past Medical History:  Diagnosis Date   Allergy    seasonal    Anxiety    Depression    Headache    Hepatitis C    HLD (hyperlipidemia) 08/09/2017   Hypertension    Hypoglycemia    Stroke [facility])     Past Surgical History:  Procedure Laterality Date   APPENDECTOMY     BUBBLE STUDY  06/16/2021   Procedure: BUBBLE STUDY;  Surgeon: [name], MD;  Location: [place];  Service: Cardiovascular;;   TEE WITHOUT CARDIOVERSION N/A 06/16/2021   Procedure: TRANSESOPHAGEAL ECHOCARDIOGRAM (TEE);  Surgeon: [name], MD;  Location: [place];  Service: Cardiovascular;  Laterality: N/A;   TONSILECTOMY, ADENOIDECTOMY, BILATERAL MYRINGOTOMY AND TUBES       Home Medications:  Prior to Admission medications   Medication Sig Start Date End Date Taking? Authorizing Provider  atorvastatin (LIPITOR) 40 MG tablet Take 1 tablet (40 mg total) by mouth daily. 01/28/22  Yes Biagio Borg, MD  cholecalciferol (VITAMIN D3) 25 MCG (1000 UNIT) tablet Take 1 tablet (1,000 Units total) by mouth daily. Take 1,000 Units by mouth daily. 11/22/21  Yes Biagio Borg, MD  clopidogrel (PLAVIX) 75 MG tablet Take 1 tablet (75 mg total) by mouth daily. 03/29/22  Yes Biagio Borg, MD  fenofibrate 160 MG tablet Take 1 tablet (160 mg total) by mouth daily.  05/11/22  Yes Biagio Borg, MD  fluticasone (FLONASE) 50 MCG/ACT nasal spray USE 2 SPRAYS IN EACH NOSTRIL DAILY Patient taking differently: Place 2 sprays into both nostrils daily. USE 2 SPRAYS IN [facility] NOSTRIL DAILY 11/22/21  Yes Biagio Borg, MD  gabapentin (NEURONTIN) 300 MG capsule TAKE 1 CAPSULE BY MOUTH EVERY  NIGHT AT BEDTIME 01/31/22  Yes Biagio Borg, MD  losartan (COZAAR) 100 MG tablet Take 1 tablet (100 mg total) by mouth daily. 12/01/21  Yes Biagio Borg, MD  metFORMIN (GLUCOPHAGE-XR) 500 MG 24 hr tablet Take 1 tablet (500 mg total) by mouth daily with breakfast. 12/03/21  Yes Biagio Borg, MD  Multiple Vitamins-Minerals (HAIR/SKIN/NAILS/BIOTIN PO) Take 1 tablet by mouth daily.   Yes [provider]  PARoxetine (PAXIL) 40 MG tablet TAKE 1 TABLET(40 MG) BY MOUTH EVERY MORNING Patient taking differently: Take 40 mg by mouth every morning. 12/31/21  Yes Biagio Borg, MD  sildenafil (VIAGRA) 100 MG tablet Take 0.5-1 tablets (50-100 mg total) by mouth daily as needed for erectile dysfunction. 06/03/22  Yes Biagio Borg, MD  vitamin E 180 MG (400 UNITS) capsule Take 400 Units by mouth daily.   Yes [provider]  zolpidem (AMBIEN) 10 MG tablet TAKE 1 TABLET(10 MG) BY MOUTH AT BEDTIME AS NEEDED FOR SLEEP Patient taking differently: Take 10 mg by mouth See admin instructions. TAKE 1 TABLET(10 MG) BY MOUTH AT BEDTIME AS NEEDED FOR SLEEP 03/29/22  Yes Biagio Borg, MD  QUEtiapine (SEROQUEL) 50 MG tablet TAKE 1 TABLET(50 MG) BY MOUTH TWICE DAILY Patient not taking: Reported on 07/09/2022 01/28/22   Biagio Borg, MD    Inpatient Medications: Scheduled Meds:  atorvastatin  40 mg Oral Daily   clopidogrel  75 mg Oral Daily   fenofibrate  160 mg Oral Daily   fluticasone  1 spray Each Nare Daily   gabapentin  300 mg Oral QHS   insulin aspart  0-6 Units Subcutaneous TID WC   losartan  100 mg Oral Daily   PARoxetine  40 mg Oral Daily   QUEtiapine  50 mg Oral BID   Continuous Infusions:  azithromycin     cefTRIAXone (ROCEPHIN)  IV     heparin     PRN Meds: acetaminophen **OR** acetaminophen, melatonin, ondansetron (ZOFRAN) IV  Allergies:   No Known Allergies  Social History:   Social History   Socioeconomic History   Marital status: Single    Spouse name: Not on file   Number of children: 0   Years of education: some college   Highest education level: Not on file  Occupational History   Occupation: retired  Tobacco Use   Smoking status: Former   Smokeless tobacco: Never   Tobacco comments:    quit 53yr ago, smoke a cigar occassionally.  Substance and Sexual Activity   Alcohol use: No   Drug use: No   Sexual activity: Yes  Other Topics Concern   Not on  file  Social History Narrative   Lives alone.   Right-handed.   Two cups caffeine per day.   Social Determinants of Health   Financial Resource Strain: Not on file  Food Insecurity: Not on file  Transportation Needs: Not on file  Physical Activity: Not on file  Stress: Not on file  Social Connections: Not on file  Intimate Partner Violence: Not on file    Family History:   Family History  Problem Relation Age of Onset  Lung disease Mother    Hyperlipidemia Father    Hypertension Father      ROS:  Please see the history of present illness.  All other ROS reviewed and negative.     Physical Exam/Data:   Vitals:   07/09/22 0911 07/09/22 0915 07/09/22 0955 07/09/22 1258  BP: (!) 168/86 (!) 169/90  (!) 154/77  Pulse: 85 87 81 89  Resp: '18 16 18 20  '$ Temp: 97.7 F (36.5 C)   (!) 97.5 F (36.4 C)  TempSrc:      SpO2: 100% 100% 100% 100%  Weight:      Height:        Intake/Output Summary (Last 24 hours) at 07/09/2022 1307 Last data filed at 07/09/2022 0155 Gross per 24 hour  Intake 500 ml  Output --  Net 500 ml      07/08/2022    6:02 PM 06/03/2022    8:07 AM 05/26/2022    2:46 PM  Last 3 Weights  Weight (lbs) 180 lb 184 lb 183 lb  Weight (kg) 81.647 kg 83.462 kg 83.008 kg     Body mass index is 24.41 kg/m.  General:  Well nourished, well developed, elderly male in no acute distress HEENT: normal for age Neck: no JVD Vascular: No carotid bruits; Distal pulses 2+ bilaterally Cardiac:  normal S1, S2; RRR; 2/6 systolic murmur  Lungs:  clear to auscultation bilaterally, no wheezing, rhonchi or rales  Abd: soft, nontender, no hepatomegaly  Ext: no edema Musculoskeletal:  No deformities, BUE and BLE strength normal and equal Skin: warm and dry  Neuro:  CNs 2-12 intact, no focal abnormalities noted Psych:  Normal affect   EKG:  The EKG was personally reviewed and demonstrates:   First ECG: ST, HR 108, ST depression noted in lateral leads Second ECG: SR, HR 82,  baseline wander makes eval harder, but ST depression seems to have improved Telemetry:  Telemetry was personally reviewed and demonstrates:  SR  Relevant CV Studies:  ECHO: 07/09/2022 Sonographer Comments: Image acquisition challenging due to respiratory  motion.  IMPRESSIONS   1. Left ventricular ejection fraction, by estimation, is 60 to 65%. The left ventricle has normal function. The left ventricle demonstrates regional wall motion abnormalities (see scoring diagram/findings for description).  The basal inferior segment is hypokinetic.Left ventricular diastolic parameters are consistent with Grade I diastolic dysfunction (impaired relaxation).   2. Right ventricular systolic function is hyperdynamic. The right  ventricular size is mildly enlarged.   3. Calcified subchordal appatatus and previously describev. The mitral valve is abnormal. Trivial mitral valve regurgitation. No evidence of mitral stenosis. Moderate mitral annular calcification.   4. The aortic valve is abnormal. Aortic valve regurgitation is not  visualized. Mild aortic valve stenosis.   5. The pulmonary veins are not sufficiently imaged to evalution for pulmonary vein thrombus based off of this study.   LE DOPPLERS: ordered  Laboratory Data:  High Sensitivity Troponin:   Recent Labs  Lab 07/08/22 1811 07/08/22 2025 07/09/22 0456  TROPONINIHS 49* 675* 1,286*     Chemistry Recent Labs  Lab 07/08/22 1811 07/08/22 2025 07/09/22 0456  NA 135  --  140  K 3.9  --  3.6  CL 102  --  107  CO2 22  --  25  GLUCOSE 176*  --  138*  BUN 18  --  16  CREATININE 1.52*  --  1.33*  CALCIUM 9.4  --  8.8*  MG  --  2.1 2.1  GFRNONAA 47*  --  55*  ANIONGAP 11  --  8    Recent Labs  Lab 07/08/22 1811 07/09/22 0456  PROT 6.5 5.8*  ALBUMIN 3.7 3.3*  AST 41 34  ALT 22 20  ALKPHOS 81 72  BILITOT 0.8 0.7   Lipids No results for input(s): "CHOL", "TRIG", "HDL", "LABVLDL", "LDLCALC", "CHOLHDL" in the last 168 hours.   Hematology Recent Labs  Lab 07/08/22 1811 07/09/22 0456  WBC 16.6* 10.8*  RBC 4.20* 3.95*  HGB 13.3 12.7*  HCT 38.7* 36.8*  MCV 92.1 93.2  MCH 31.7 32.2  MCHC 34.4 34.5  RDW 12.8 12.9  PLT 121* 99*   Thyroid No results for input(s): "TSH", "FREET4" in the last 168 hours.  BNPNo results for input(s): "BNP", "PROBNP" in the last 168 hours.  DDimer No results for input(s): "DDIMER" in the last 168 hours.   Radiology/Studies:  MR BRAIN W WO CONTRAST  Result Date: 07/09/2022 CLINICAL DATA:  Study identified as missing a report during evaluation of emergent CTA chest at 12:27 pm on 07/09/2022. 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 EXAM: MRI HEAD WITHOUT AND WITH CONTRAST TECHNIQUE: Multiplanar, multiecho pulse sequences of the brain and surrounding structures were obtained without and with intravenous contrast. CONTRAST:  7.5 mL Vueway COMPARISON:  Noncontrast brain MRI 04/03/2021. FINDINGS: Brain: There are multiple small foci of abnormal diffusion scattered in the bilateral cerebral hemispheres, and a left lentiform focus (series 3, image 76 and left periventricular white matter focus (image 82) appear to be restricted. Of note, there were similar scattered small hemispheric infarcts suspected on the previous exam in 2022. No associated hemorrhage or mass effect. Chronically advanced cerebral white matter disease with confluent T2 and FLAIR hyperintensity. Some small areas of chronic white matter cystic encephalomalacia. Chronic bilateral deep gray matter nuclei lacunar infarcts also, progressed since 2022. No definite chronic cerebral blood products. Small if any areas of chronic cortical encephalomalacia. Small homogeneously enhancing left tentorium meningioma is 19 by 15 x 7 mm (AP by transverse by CC) and does not appear significantly changed since 2022. no associated edema or significant mass effect. No other abnormal intracranial enhancement or  dural thickening identified. Vascular: Major intracranial vascular flow voids are stable since 2022. Following contrast the major dural venous sinuses are enhancing and appear to be patent. Skull and upper cervical spine: Visualized bone marrow signal is within normal limits. Negative visible cervical spine. Sinuses/Orbits: Stable, negative. Other: Mastoids remain clear. Visible internal auditory structures appear normal. IMPRESSION: 1. Evidence of small recurrent acute or subacute infarcts scattered in both hemispheres, similar to that demonstrated in 2022. No associated hemorrhage or mass effect. See also emergent Chest CTA report 07/09/2022. 2. Underlying progression of chronic small vessel ischemia since 2022. 3. 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 Suspect this is clinically silent. Electronically Signed   By: Genevie Ann M.D.   On: 07/09/2022 12:38   CT Angio Chest Pulmonary Embolism (PE) W or WO Contrast  Addendum Date: 07/09/2022   ADDENDUM REPORT: 07/09/2022 12:35 ADDENDUM: Of note the patient also underwent a brain MRI of July 08, 2022. Interpretation is pending but preliminarily review with neuroradiology shows stigmata of multiple small infarcts particularly in the LEFT cerebral hemisphere and also likely in the LEFT basal ganglia, most suggestive of embolic phenomenon. This further supports the possibility of pulmonary venous side and LEFT atrial extension of thrombus from RIGHT inferior pulmonary vein. Suggest neurologic and cardiology consultation  in this complex patient who also has stigmata of liver disease which is quite severe based on hepatic morphology. Critical Value/emergent results were called by telephone at the time of interpretation on 07/09/2022 at 12:35 pm to provider DAWOOD [facility] , who verbally acknowledged these results. Electronically Signed   By: Zetta Bills M.D.   On: 07/09/2022 12:35   Result Date: 07/09/2022 CLINICAL DATA:  Chest pain, elevated  troponin. Rule out pulmonary embolism. EXAM: CT ANGIOGRAPHY CHEST WITH CONTRAST TECHNIQUE: Multidetector CT imaging of the chest was performed using the standard protocol during bolus administration of intravenous contrast. Multiplanar CT image reconstructions and MIPs were obtained to evaluate the vascular anatomy. RADIATION DOSE REDUCTION: This exam was performed according to the departmental dose-optimization program which includes automated exposure control, adjustment of the mA and/or kV according to patient size and/or use of iterative reconstruction technique. CONTRAST:  32m OMNIPAQUE IOHEXOL 350 MG/ML SOLN COMPARISON:  Abdominal imaging from February 25, 2022 FINDINGS: Cardiovascular: Calcified and noncalcified aortic atherosclerotic plaque. No gross acute aortic process though assessment is limited by bolus timing, study performed for evaluation of pulmonary arteries. Heart size is normal without pericardial effusion or nodularity. There is three-vessel coronary artery disease. Main pulmonary artery is opacified to 4 in 16 Hounsfield units. There is no sign of pulmonary embolism. Posterior division inferior pulmonary vein on the RIGHT and inferior pulmonary vein showing variable attenuation. There is peripheral ground-glass in there is non opacification of posterior division of the RIGHT inferior pulmonary vein seen on image 214/6. All other branches of the pulmonary venous system on the RIGHT and LEFT appear well opacified. Low-attenuation extending into the RIGHT inferior pulmonary vein and low attenuation present at the orifice of the vein in the LEFT atrium. Mediastinum/Nodes: Thoracic inlet is unremarkable. No axillary lymphadenopathy. No mediastinal or gross hilar lymphadenopathy with scattered small lymph nodes in the chest. Lungs/Pleura: Patchy ground-glass opacities some areas with distinct nodularity, for example on image 55/7 largest area measuring 7 mm discrete from a more geographic area that  measures up to 5 cm in the RIGHT upper lobe. Another area of more discrete ground-glass measuring 12 x 9 mm. Patchy areas of ground-glass attenuation in posterior RIGHT lower lobe in areas subtended by the venous abnormality seen above. There is a discrete RIGHT lower lobe nodule measuring 6 mm (image 55/7) Small nodules also present at the LEFT lung base 6 mm nodule on image 90/7 and a 5 mm nodule on image 94/7. Upper Abdomen: Signs of marked cirrhotic hepatic morphology. Liver not well assessed, studies essentially a noncontrast exam in the abdomen. Spleen is moderately enlarged and not well evaluated. Imaged portions of the pancreas, adrenal glands and kidneys are unremarkable without acute gastrointestinal process seen in the upper abdomen. Musculoskeletal: No acute bone finding. No destructive bone process. Spinal degenerative changes. Review of the MIP images confirms the above findings. IMPRESSION: 1. No sign of pulmonary embolism. 2. Posterior division inferior pulmonary vein on the RIGHT and inferior pulmonary vein showing lack of opacification. This extends to the orifice of the RIGHT inferior pulmonary vein and perhaps even into the periphery of the LEFT atrium. This is moderately suspicious for thrombus made more suspicious by the normal opacification of other pulmonary venous branches in the chest. Consider cardiology consult confirmation with venous phase assessment of the chest is suggested. 3. Multiple areas of patchy ground-glass and discrete nodularity in the RIGHT upper lobe. Findings favor an infectious or inflammatory etiology. 4. Better defined lower lobe nodules  largest at 6 mm. Given the multifocal nature of findings in the chest and concern for infection but some indeterminate more discrete nodular areas with suggest 8-12 week follow-up outside of the acute setting to ensure resolution. 5. Marked hepatic cirrhosis 6. Aortic atherosclerosis and coronary artery disease. Aortic Atherosclerosis  (ICD10-I70.0). Critical Value/emergent results were called by telephone at the time of interpretation on 07/09/2022 at 12:20 pm to provider DAWOOD [facility] , who verbally acknowledged these results. Electronically Signed: By: Zetta Bills M.D. On: 07/09/2022 12:20   DG Chest 2 View  Result Date: 07/08/2022 CLINICAL DATA:  Chest pain.  Fever.  Flu symptoms. EXAM: CHEST - 2 VIEW COMPARISON:  03/10/2021 FINDINGS: The heart size and mediastinal contours are within normal limits. Elevation of right hemidiaphragm again noted. Mild asymmetric patchy opacity is seen in the right upper lobe which appears new, suspicious for pneumonia. Evidence of pleural effusion. IMPRESSION: Mild asymmetric patchy opacity in right upper lobe, suspicious for pneumonia. Recommend continued chest radiographic follow-up to confirm resolution. Electronically Signed   By: Marlaine Hind M.D.   On: 07/08/2022 18:48     Assessment and Plan:   Chest pain -Symptoms have resolved, troponin 1286, drawn greater than 12 hours after chest pain began -The echo is now back in the basal inferior segment is hypokinetic but his EF overall is normal -MD advise on heparin and daily aspirin, he is already on Plavix -His blood pressure would tolerate a beta-blocker, will add Coreg 6.125 mg twice daily -If he does not have any more pain, it may be best to let him recover from the pneumonia and then decide on an ischemic eval  2.  Sepsis from pneumonia -He is on dual antibiotics and is in proved greatly from admission -Per IM   Risk Assessment/Risk Scores:     TIMI Risk Score for Unstable Angina or Non-ST Elevation MI:   The patient's TIMI risk score is 3, which indicates a 13% risk of all cause mortality, new or recurrent myocardial infarction or need for urgent revascularization in the next 14 days.  For questions or updates, please contact [place] Please consult [email or web address] for contact info under    Signed, Rosaria Ferries, PA-C  07/09/2022 1:07 PM  Patient seen and examined and agree with Rosaria Ferries, PA-C as detailed above.  In brief, the patient male with a hx of CVA, DM2, hypertension, hyperlipidemia, hep C, CVA, and CKD III, who presented to the ER with severe sepsis from CAP with course complicated by chest pain and elevated troponin for which Cardiology has been consulted.   Patient presents with chest pressure, fevers, chills, and diaphoresis in the setting of community acquired PNA. ECG with NSR without acute ischemic changes, however, his trop rise is concerning for underlying possibly significant CAD [phone] and CTA PE protocol with extensive coronary Ca. Fortunately, TTE with normal BiV function without significant wall motion on my review and patient is chest pain free currently. Prior to this admission, he denied any exertional chest pain or worsening SOB. He was noted on CTA to have lack of opacification of the right inferior pulmonary vein while other pulmonary veins opacified normally raising concern for thrombus. He has since been placed on heparin gtt for both NSTEMI and possible PV thrombus.  Overall, in regards to his chest pain, this sounds more to be related to underlying pneumonia rather than true angina. Agree with continuing management of sepsis/CAP and trending trop. Once more improved from a pneumonia standpoint,  can pursue ischemic testing at that time unless symptoms worsen/ECG changes or trops significantly increase. Will plan to start heparin x48h for now for NSTEMI and possible PV thrombosis.  In regards to concern for right inferior vein thrombosis. This would be unusual as no history pf prior cardiac procedures/ligation/PV ablation and would definitely suggest some underlying hypercoaguable state. We can try to combine coronary CTA and PV study on Monday to help determine degree of CAD and if thrombus is present in order to minimize contrast and number of studies ordered. He  will need to have HR better controlled prior to the CTA.   GEN: No acute distress.   Neck: No JVD Cardiac: RRR, 2/6 harsh,early peaking systolic murmur Respiratory: Clear to auscultation bilaterally. GI: Soft, nontender, non-distended  MS: Trace edema, warm Neuro:  Nonfocal  Psych: Normal affect    Plan: -Would recommend obtaining coronary CTA with PV study on Monday to help determine degree of CAD as well as elucidate if PV thrombus present; this would allow for less contrast/studies to be performed -Agree with heparin gtt for now for medical management of NSTEMI and would also be indicated in PV thrombus present -It would be very unusual to have PV thrombus in the absence of prior procedure/ligation etc and would significantly raise concern for hypercoaguable state -Continue management of sepsis/PNA per primary -Continue ASA, plavix and lipitor -Continue losartan and metoprolol  Gwyndolyn Kaufman, MD

## 2022-07-09 NOTE — ED Notes (Signed)
Patient transported to vascular. 

## 2022-07-09 NOTE — Progress Notes (Signed)
  Echocardiogram 2D Echocardiogram has been performed.  Collin Bridges 07/09/2022, 9:05 AM

## 2022-07-09 NOTE — ED Notes (Signed)
Pt given a sanwich with 2 ginger ale's, pt has significant other at bedside with him

## 2022-07-09 NOTE — ED Notes (Signed)
ED TO INPATIENT HANDOFF REPORT  ED Nurse Name and Phone #: Albina Billet [phone] Name/Age/Gender Virgel Manifold 77 y.o. male Room/Bed: 009C/009C  Code Status   Code Status: Full Code  Home/SNF/Other Home Patient oriented to: self, place, time, and situation Is this baseline? Yes   Triage Complete: Triage complete  Chief Complaint CAP (community acquired pneumonia) [J18.9]  Triage Note Pt to ED c/o chest pain ,fever, flu symptoms that started this morning. Reports took tylenol at 4pm. Also reports took aspirin prior to arrival   Allergies No Known Allergies  Level of Care/Admitting Diagnosis ED Disposition   ED Disposition: Admit Condition: None Comment: Hospital Area: [place] [[id number]]  Level of Care: Progressive [102]  Admit to Progressive based on following criteria: MULTISYSTEM THREATS such as stable sepsis, metabolic/electrolyte imbalance with or without encephalopathy that is responding to early treatment.  May admit patient to [facility] or [facility] if equivalent level of care is available:: No  Covid Evaluation: Asymptomatic - no recent exposure (last 10 days) testing not required  Diagnosis: CAP (community acquired pneumonia) [id number]  Admitting Physician: Rhetta Mura [id number]  Attending Physician: Rhetta Mura [id number]  Certification:: I certify this patient will need inpatient services for at least 2 midnights  Estimated Length of Stay: 2      B Medical/Surgery History Past Medical History:  Diagnosis Date   Allergy    seasonal   Anxiety    Depression    Headache    Hepatitis C    HLD (hyperlipidemia) 08/09/2017   Hypertension    Hypoglycemia    Stroke [facility])    Past Surgical History:  Procedure Laterality Date   APPENDECTOMY     BUBBLE STUDY  06/16/2021   Procedure: BUBBLE STUDY;  Surgeon: [name], MD;  Location: [place];  Service: Cardiovascular;;   TEE WITHOUT CARDIOVERSION N/A  06/16/2021   Procedure: TRANSESOPHAGEAL ECHOCARDIOGRAM (TEE);  Surgeon: [name], MD;  Location: [place];  Service: Cardiovascular;  Laterality: N/A;   TONSILECTOMY, ADENOIDECTOMY, BILATERAL MYRINGOTOMY AND TUBES       A IV Location/Drains/Wounds Patient Lines/Drains/Airways Status     Active Line/Drains/Airways     Name Placement date Placement time Site Days   Peripheral IV 06/16/21 20 G Posterior;Right Hand 06/16/21  0802  Hand  388   Peripheral IV 07/08/22 20 G Anterior;Right Forearm 07/08/22  2028  Forearm  1   Peripheral IV 07/08/22 22 G Anterior;Left Forearm 07/08/22  2324  Forearm  1            Intake/Output Last 24 hours  Intake/Output Summary (Last 24 hours) at 07/09/2022 1634 Last data filed at 07/09/2022 0155 Gross per 24 hour  Intake 500 ml  Output no documentation  Net 500 ml    Labs/Imaging Results for orders placed or performed during the hospital encounter of 07/08/22 (from the past 48 hour(s))  Resp panel by RT-PCR (RSV, Flu A&B, Covid) Anterior Nasal Swab     Status: None   Collection Time: 07/08/22  6:08 PM   Specimen: Anterior Nasal Swab  Result Value Ref Range   SARS Coronavirus 2 by RT PCR NEGATIVE NEGATIVE   Influenza A by PCR NEGATIVE NEGATIVE   Influenza B by PCR NEGATIVE NEGATIVE    Comment: (NOTE) The Xpert Xpress SARS-CoV-2/FLU/RSV plus assay is intended as an aid in the diagnosis of influenza from Nasopharyngeal swab specimens and should not be used as a sole basis for treatment.  Nasal washings and aspirates are unacceptable for Xpert Xpress SARS-CoV-2/FLU/RSV testing.  Fact Sheet for Patients: EntrepreneurPulse.com.au  Fact Sheet for Healthcare Providers: IncredibleEmployment.be  This test is not yet approved or cleared by the [place] FDA and has been authorized for detection and/or diagnosis of SARS-CoV-2 by FDA under an Emergency Use Authorization (EUA). This EUA will  remain in effect (meaning this test can be used) for the duration of the COVID-19 declaration under Section 564(b)(1) of the Act, 21 U.S.C. section 360bbb-3(b)(1), unless the authorization is terminated or revoked.     Resp Syncytial Virus by PCR NEGATIVE NEGATIVE    Comment: (NOTE) Fact Sheet for Patients: EntrepreneurPulse.com.au  Fact Sheet for Healthcare Providers: IncredibleEmployment.be  This test is not yet approved or cleared by the [place] FDA and has been authorized for detection and/or diagnosis of SARS-CoV-2 by FDA under an Emergency Use Authorization (EUA). This EUA will remain in effect (meaning this test can be used) for the duration of the COVID-19 declaration under Section 564(b)(1) of the Act, 21 U.S.C. section 360bbb-3(b)(1), unless the authorization is terminated or revoked.  Performed at [facility], [place] [address], [place], [place] [address]   Respiratory (~20 pathogens) panel by PCR     Status: None   Collection Time: 07/08/22  6:08 PM   Specimen: Nasopharyngeal Swab; Respiratory  Result Value Ref Range   Adenovirus NOT DETECTED NOT DETECTED   Coronavirus 229E NOT DETECTED NOT DETECTED    Comment: (NOTE) The Coronavirus on the Respiratory Panel, DOES NOT test for the novel  Coronavirus (2019 nCoV)    Coronavirus HKU1 NOT DETECTED NOT DETECTED   Coronavirus NL63 NOT DETECTED NOT DETECTED   Coronavirus OC43 NOT DETECTED NOT DETECTED   Metapneumovirus NOT DETECTED NOT DETECTED   Rhinovirus / Enterovirus NOT DETECTED NOT DETECTED   Influenza A NOT DETECTED NOT DETECTED   Influenza B NOT DETECTED NOT DETECTED   Parainfluenza Virus 1 NOT DETECTED NOT DETECTED   Parainfluenza Virus 2 NOT DETECTED NOT DETECTED   Parainfluenza Virus 3 NOT DETECTED NOT DETECTED   Parainfluenza Virus 4 NOT DETECTED NOT DETECTED   Respiratory Syncytial Virus NOT DETECTED NOT DETECTED   Bordetella pertussis NOT DETECTED NOT  DETECTED   Bordetella Parapertussis NOT DETECTED NOT DETECTED   Chlamydophila pneumoniae NOT DETECTED NOT DETECTED   Mycoplasma pneumoniae NOT DETECTED NOT DETECTED    Comment: Performed at [facility], [place] [address] [place], [place] [address]  CBC     Status: Abnormal   Collection Time: 07/08/22  6:11 PM  Result Value Ref Range   WBC 16.6 (H) 4.0 - 10.5 K/uL   RBC 4.20 (L) 4.22 - 5.81 MIL/uL   Hemoglobin 13.3 13.0 - 17.0 g/dL   HCT 38.7 (L) 39.0 - 52.0 %   MCV 92.1 80.0 - 100.0 fL   MCH 31.7 26.0 - 34.0 pg   MCHC 34.4 30.0 - 36.0 g/dL   RDW 12.8 11.5 - 15.5 %   Platelets 121 (L) 150 - 400 K/uL   nRBC 0.0 0.0 - 0.2 %    Comment: Performed at [facility], [place] [address], [place], [place] [address]  Troponin I (High Sensitivity)     Status: Abnormal   Collection Time: 07/08/22  6:11 PM  Result Value Ref Range   Troponin I (High Sensitivity) 49 (H) <18 ng/L    Comment: (NOTE) Elevated high sensitivity troponin I (hsTnI) values and significant  changes across serial measurements may  suggest ACS but many other  chronic and acute conditions are known to elevate hsTnI results.  Refer to the "Links" section for chest pain algorithms and additional  guidance. Performed at [facility], [place] [address], [place], [place] [address]   Comprehensive metabolic panel     Status: Abnormal   Collection Time: 07/08/22  6:11 PM  Result Value Ref Range   Sodium 135 135 - 145 mmol/L   Potassium 3.9 3.5 - 5.1 mmol/L   Chloride 102 98 - 111 mmol/L   CO2 22 22 - 32 mmol/L   Glucose, Bld 176 (H) 70 - 99 mg/dL    Comment: Glucose reference range applies only to samples taken after fasting for at least 8 hours.   BUN 18 8 - 23 mg/dL   Creatinine, Ser 1.52 (H) 0.61 - 1.24 mg/dL   Calcium 9.4 8.9 - 10.3 mg/dL   Total Protein 6.5 6.5 - 8.1 g/dL   Albumin 3.7 3.5 - 5.0 g/dL   AST 41 15 - 41 U/L   ALT 22 0 - 44 U/L   Alkaline Phosphatase 81 38 - 126 U/L   Total Bilirubin 0.8 0.3  - 1.2 mg/dL   GFR, Estimated 47 (L) >60 mL/min    Comment: (NOTE) Calculated using the CKD-EPI Creatinine Equation (2021)    Anion gap 11 5 - 15    Comment: Performed at [place] [facility] [address] [place], [place] [address]  Troponin I (High Sensitivity)     Status: Abnormal   Collection Time: 07/08/22  8:25 PM  Result Value Ref Range   Troponin I (High Sensitivity) 675 (HH) <18 ng/L    Comment: CRITICAL RESULT CALLED TO, READ BACK BY AND VERIFIED WITH STRAIGHTER.A RN 07/08/22 2142 AMIREHSANI F (NOTE) Elevated high sensitivity troponin I (hsTnI) values and significant  changes across serial measurements may suggest ACS but many other  chronic and acute conditions are known to elevate hsTnI results.  Refer to the "Links" section for chest pain algorithms and additional  guidance. Performed at [facility], [place] [address], [place], [place] [address]   Magnesium     Status: None   Collection Time: 07/08/22  8:25 PM  Result Value Ref Range   Magnesium 2.1 1.7 - 2.4 mg/dL    Comment: Performed at [facility] [address], [place], [place] [address]  Procalcitonin     Status: None   Collection Time: 07/08/22  8:25 PM  Result Value Ref Range   Procalcitonin 0.33 ng/mL    Comment:        Interpretation: PCT (Procalcitonin) <= 0.5 ng/mL: Systemic infection (sepsis) is not likely. Local bacterial infection is possible. (NOTE)       Sepsis PCT Algorithm           Lower Respiratory Tract                                      Infection PCT Algorithm    ----------------------------     ----------------------------         PCT < 0.25 ng/mL                PCT < 0.10 ng/mL          Strongly encourage             Strongly discourage   discontinuation of antibiotics  initiation of antibiotics    ----------------------------     -----------------------------       PCT 0.25 - 0.50 ng/mL            PCT 0.10 - 0.25 ng/mL               OR       >80% decrease in PCT             Discourage initiation of                                            antibiotics      Encourage discontinuation           of antibiotics    ----------------------------     -----------------------------         PCT >= 0.50 ng/mL              PCT 0.26 - 0.50 ng/mL               AND        <80% decrease in PCT             Encourage initiation of                                             antibiotics       Encourage continuation           of antibiotics    ----------------------------     -----------------------------        PCT >= 0.50 ng/mL                  PCT > 0.50 ng/mL               AND         increase in PCT                  Strongly encourage                                      initiation of antibiotics    Strongly encourage escalation           of antibiotics                                     -----------------------------                                           PCT <= 0.25 ng/mL                                                 OR                                        >  80% decrease in PCT                                      Discontinue / Do not initiate                                             antibiotics  Performed at [facility], [place] [address], [place], [place] [address]   Culture, blood (routine x 2)     Status: None (Preliminary result)   Collection Time: 07/08/22 10:24 PM   Specimen: BLOOD  Result Value Ref Range   Specimen Description BLOOD BLOOD RIGHT HAND    Special Requests      BOTTLES DRAWN AEROBIC AND ANAEROBIC Blood Culture results may not be optimal due to an inadequate volume of blood received in culture bottles   Culture      NO GROWTH < 12 HOURS Performed at [facility] [address], [place], [place] [address]    Report Status PENDING   Lactic acid, plasma     Status: None   Collection Time: 07/08/22 10:38 PM  Result Value Ref Range   Lactic Acid, Venous 1.8 0.5 - 1.9 mmol/L    Comment: Performed at [facility] [address], [place], [place] [address]  Culture, blood (routine x 2)     Status: None (Preliminary result)   Collection Time: 07/08/22 11:15 PM   Specimen: BLOOD  Result Value Ref Range   Specimen Description BLOOD SITE NOT SPECIFIED    Special Requests      BOTTLES DRAWN AEROBIC AND ANAEROBIC Blood Culture results may not be optimal due to an inadequate volume of blood received in culture bottles   Culture      NO GROWTH < 12 HOURS Performed at [facility] [address], [place], [place] [address]    Report Status PENDING   CBC with Differential/Platelet     Status: Abnormal   Collection Time: 07/09/22  4:56 AM  Result Value Ref Range   WBC 10.8 (H) 4.0 - 10.5 K/uL   RBC 3.95 (L) 4.22 - 5.81 MIL/uL   Hemoglobin 12.7 (L) 13.0 - 17.0 g/dL   HCT 36.8 (L) 39.0 - 52.0 %   MCV 93.2 80.0 - 100.0 fL   MCH 32.2 26.0 - 34.0 pg   MCHC 34.5 30.0 - 36.0 g/dL   RDW 12.9 11.5 - 15.5 %   Platelets 99 (L) 150 - 400 K/uL    Comment: Immature Platelet Fraction may be clinically indicated, consider ordering this additional test [id number] REPEATED TO VERIFY    nRBC 0.0 0.0 - 0.2 %   Neutrophils Relative % 75 %   Neutro Abs 8.1 (H) 1.7 - 7.7 K/uL   Lymphocytes Relative 15 %   Lymphs Abs 1.7 0.7 - 4.0 K/uL   Monocytes Relative 6 %   Monocytes Absolute 0.6 0.1 - 1.0 K/uL   Eosinophils Relative 4 %   Eosinophils Absolute 0.4 0.0 - 0.5 K/uL   Basophils Relative 0 %   Basophils Absolute 0.0 0.0 - 0.1 K/uL   Immature Granulocytes 0 %   Abs Immature Granulocytes 0.03 0.00 - 0.07 K/uL    Comment: Performed at [facility], [place] [address],  [place], [place] [address]  Comprehensive metabolic panel     Status: Abnormal   Collection Time: 07/09/22  4:56 AM  Result Value Ref Range   Sodium 140 135 - 145 mmol/L   Potassium 3.6 3.5 - 5.1 mmol/L   Chloride 107 98 - 111 mmol/L   CO2 25 22 - 32 mmol/L   Glucose, Bld 138 (H) 70 - 99 mg/dL    Comment: Glucose reference range  applies only to samples taken after fasting for at least 8 hours.   BUN 16 8 - 23 mg/dL   Creatinine, Ser 1.33 (H) 0.61 - 1.24 mg/dL   Calcium 8.8 (L) 8.9 - 10.3 mg/dL   Total Protein 5.8 (L) 6.5 - 8.1 g/dL   Albumin 3.3 (L) 3.5 - 5.0 g/dL   AST 34 15 - 41 U/L   ALT 20 0 - 44 U/L   Alkaline Phosphatase 72 38 - 126 U/L   Total Bilirubin 0.7 0.3 - 1.2 mg/dL   GFR, Estimated 55 (L) >60 mL/min    Comment: (NOTE) Calculated using the CKD-EPI Creatinine Equation (2021)    Anion gap 8 5 - 15    Comment: Performed at [facility], [place] [address], [place], [place] [address]  Magnesium     Status: None   Collection Time: 07/09/22  4:56 AM  Result Value Ref Range   Magnesium 2.1 1.7 - 2.4 mg/dL    Comment: Performed at [facility] [address], [place], [place] [address]  Lipase, blood     Status: None   Collection Time: 07/09/22  4:56 AM  Result Value Ref Range   Lipase 50 11 - 51 U/L    Comment: Performed at [facility] [address] [place], [place] [address]  Troponin I (High Sensitivity)     Status: Abnormal   Collection Time: 07/09/22  4:56 AM  Result Value Ref Range   Troponin I (High Sensitivity) 1,286 (HH) <18 ng/L    Comment: CRITICAL VALUE NOTED. VALUE IS CONSISTENT WITH PREVIOUSLY REPORTED/CALLED VALUE (NOTE) Elevated high sensitivity troponin I (hsTnI) values and significant  changes across serial measurements may suggest ACS but many other  chronic and acute conditions are known to elevate hsTnI results.  Refer to the "Links" section for chest pain algorithms and additional  guidance. Performed at [facility], [place] [address] [place], [place] [address]   CBG monitoring, ED     Status: Abnormal   Collection Time: 07/09/22  7:53 AM  Result Value Ref Range   Glucose-Capillary 110 (H) 70 - 99 mg/dL    Comment: Glucose reference range applies only to samples taken after fasting for at least 8 hours.  CBG monitoring, ED     Status: Abnormal    Collection Time: 07/09/22 12:27 PM  Result Value Ref Range   Glucose-Capillary 106 (H) 70 - 99 mg/dL    Comment: Glucose reference range applies only to samples taken after fasting for at least 8 hours.   ECHOCARDIOGRAM COMPLETE  Result Date: 07/09/2022    ECHOCARDIOGRAM REPORT   Patient Name:   Collin Bridges Date of Exam: 07/09/2022 Medical Rec #:  [id number]             Height:       72.0 in Accession #:    [id number]            Weight:       180.0 lb Date of Birth:  09/15/1945  BSA:          2.037 m Patient Age:    31 years              BP:           161/85 mmHg Patient Gender: M                     HR:           89 bpm. Exam Location:  Inpatient Procedure: 2D Echo Indications:    elevated troponin  History:        Patient has prior history of Echocardiogram examinations, most                 recent 06/16/2021. CHF, chronic kidney disease. Sepsis.; Risk                 Factors:Diabetes, Hypertension and Dyslipidemia.  Sonographer:    Johny Chess RDCS Referring Phys: [id number] Rhetta Mura  Sonographer Comments: Image acquisition challenging due to respiratory motion. IMPRESSIONS  1. Left ventricular ejection fraction, by estimation, is 60 to 65%. The left ventricle has normal function. The left ventricle demonstrates regional wall motion abnormalities (see scoring diagram/findings for description). Left ventricular diastolic parameters are consistent with Grade I diastolic dysfunction (impaired relaxation).  2. Right ventricular systolic function is hyperdynamic. The right ventricular size is mildly enlarged.  3. Calcified subchordal appatatus and previously describev. The mitral valve is abnormal. Trivial mitral valve regurgitation. No evidence of mitral stenosis. Moderate mitral annular calcification.  4. The aortic valve is abnormal. Aortic valve regurgitation is not visualized. Mild aortic valve stenosis.  5. The pulmonary veins are not sufficiently imaged to evalution for  pulmonary vein thrombus based off of this study. FINDINGS  Left Ventricle: Left ventricular ejection fraction, by estimation, is 60 to 65%. The left ventricle has normal function. The left ventricle demonstrates regional wall motion abnormalities. The left ventricular internal cavity size was normal in size. There is no left ventricular hypertrophy. Left ventricular diastolic parameters are consistent with Grade I diastolic dysfunction (impaired relaxation).  LV Wall Scoring: The basal inferior segment is hypokinetic. Right Ventricle: The right ventricular size is mildly enlarged. No increase in right ventricular wall thickness. Right ventricular systolic function is hyperdynamic. Left Atrium: Left atrial size was normal in size. Right Atrium: Right atrial size was normal in size. Pericardium: There is no evidence of pericardial effusion. Presence of epicardial fat layer. Mitral Valve: Calcified subchordal appatatus and previously describev. The mitral valve is abnormal. Moderate mitral annular calcification. Trivial mitral valve regurgitation. No evidence of mitral valve stenosis. Tricuspid Valve: The tricuspid valve is normal in structure. Tricuspid valve regurgitation is not demonstrated. No evidence of tricuspid stenosis. Aortic Valve: The aortic valve is abnormal. Aortic valve regurgitation is not visualized. Mild aortic stenosis is present. Aortic valve mean gradient measures 10.0 mmHg. Aortic valve peak gradient measures 21.5 mmHg. Aortic valve area, by VTI measures 1.37 cm. Pulmonic Valve: The pulmonary veins are not sufficiently imaged to evalution for pulmonary vein thrombus based off of this study. The pulmonic valve was not well visualized. Pulmonic valve regurgitation is not visualized. Aorta: The aortic root and ascending aorta are structurally normal, with no evidence of dilitation. Venous: The pulmonary veins are not sufficiently imaged to evalution for pulmonary vein thrombus based off of this  study. IAS/Shunts: No atrial level shunt detected by color flow Doppler.  LEFT VENTRICLE PLAX 2D LVIDd:  4.60 cm   Diastology LVIDs:         3.60 cm   LV e' medial:    8.27 cm/s LV PW:         0.90 cm   LV E/e' medial:  11.0 LV IVS:        1.00 cm   LV e' lateral:   8.81 cm/s LVOT diam:     1.90 cm   LV E/e' lateral: 10.3 LV SV:         61 LV SV Index:   30 LVOT Area:     2.84 cm  RIGHT VENTRICLE RV Basal diam:  2.70 cm RV S prime:     13.80 cm/s TAPSE (M-mode): 3.0 cm LEFT ATRIUM             Index        RIGHT ATRIUM           Index LA diam:        3.70 cm 1.82 cm/m   RA Area:     15.30 cm LA Vol (A2C):   43.1 ml 21.16 ml/m  RA Volume:   38.30 ml  18.80 ml/m LA Vol (A4C):   51.2 ml 25.13 ml/m LA Biplane Vol: 49.5 ml 24.30 ml/m  AORTIC VALVE AV Area (Vmax):    1.16 cm AV Area (Vmean):   1.23 cm AV Area (VTI):     1.37 cm AV Vmax:           232.00 cm/s AV Vmean:          152.000 cm/s AV VTI:            0.448 m AV Peak Grad:      21.5 mmHg AV Mean Grad:      10.0 mmHg LVOT Vmax:         94.85 cm/s LVOT Vmean:        65.750 cm/s LVOT VTI:          0.216 m LVOT/AV VTI ratio: 0.48  AORTA Ao Root diam: 3.50 cm Ao Asc diam:  3.70 cm MITRAL VALVE MV Area (PHT): 4.31 cm     SHUNTS MV Decel Time: 176 msec     Systemic VTI:  0.22 m MV E velocity: 90.70 cm/s   Systemic Diam: 1.90 cm MV A velocity: 116.00 cm/s MV E/A ratio:  0.78 Rudean Haskell MD Electronically signed by Rudean Haskell MD Signature Date/Time: 07/09/2022/1:08:59 PM    Final    MR BRAIN W WO CONTRAST  Result Date: 07/09/2022 CLINICAL DATA:  Study identified as missing a report during evaluation of emergent CTA chest at 12:27 pm on 07/09/2022. 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 EXAM: MRI HEAD WITHOUT AND WITH CONTRAST TECHNIQUE: Multiplanar, multiecho pulse sequences of the brain and surrounding structures were obtained without and with intravenous contrast. CONTRAST:  7.5 mL [facility]  COMPARISON:  Noncontrast brain MRI 04/03/2021. FINDINGS: Brain: There are multiple small foci of abnormal diffusion scattered in the bilateral cerebral hemispheres, and a left lentiform focus (series 3, image 76 and left periventricular white matter focus (image 82) appear to be restricted. Of note, there were similar scattered small hemispheric infarcts suspected on the previous exam in 2022. No associated hemorrhage or mass effect. Chronically advanced cerebral white matter disease with confluent T2 and FLAIR hyperintensity. Some small areas of chronic white matter cystic encephalomalacia. Chronic bilateral deep gray matter nuclei lacunar infarcts also, progressed since 2022. No definite chronic  cerebral blood products. Small if any areas of chronic cortical encephalomalacia. Small homogeneously enhancing left tentorium meningioma is 19 by 15 x 7 mm (AP by transverse by CC) and does not appear significantly changed since 2022. no associated edema or significant mass effect. No other abnormal intracranial enhancement or dural thickening identified. Vascular: Major intracranial vascular flow voids are stable since 2022. Following contrast the major dural venous sinuses are enhancing and appear to be patent. Skull and upper cervical spine: Visualized bone marrow signal is within normal limits. Negative visible cervical spine. Sinuses/Orbits: Stable, negative. Other: Mastoids remain clear. Visible internal auditory structures appear normal. IMPRESSION: 1. Evidence of small recurrent acute or subacute infarcts scattered in both hemispheres, similar to that demonstrated in 2022. No associated hemorrhage or mass effect. See also emergent Chest CTA report 07/09/2022. 2. Underlying progression of chronic small vessel ischemia since 2022. 3. 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 Suspect this is clinically silent. Electronically Signed   By: Genevie Ann M.D.   On: 07/09/2022 12:38   CT Angio Chest Pulmonary  Embolism (PE) W or WO Contrast  Addendum Date: 07/09/2022   ADDENDUM REPORT: 07/09/2022 12:35 ADDENDUM: Of note the patient also underwent a brain MRI of July 08, 2022. Interpretation is pending but preliminarily review with neuroradiology shows stigmata of multiple small infarcts particularly in the LEFT cerebral hemisphere and also likely in the LEFT basal ganglia, most suggestive of embolic phenomenon. This further supports the possibility of pulmonary venous side and LEFT atrial extension of thrombus from RIGHT inferior pulmonary vein. Suggest neurologic and cardiology consultation in this complex patient who also has stigmata of liver disease which is quite severe based on hepatic morphology. Critical Value/emergent results were called by telephone at the time of interpretation on 07/09/2022 at 12:35 pm to provider DAWOOD [facility] , who verbally acknowledged these results. Electronically Signed   By: Zetta Bills M.D.   On: 07/09/2022 12:35   Result Date: 07/09/2022 CLINICAL DATA:  Chest pain, elevated troponin. Rule out pulmonary embolism. EXAM: CT ANGIOGRAPHY CHEST WITH CONTRAST TECHNIQUE: Multidetector CT imaging of the chest was performed using the standard protocol during bolus administration of intravenous contrast. Multiplanar CT image reconstructions and MIPs were obtained to evaluate the vascular anatomy. RADIATION DOSE REDUCTION: This exam was performed according to the departmental dose-optimization program which includes automated exposure control, adjustment of the mA and/or kV according to patient size and/or use of iterative reconstruction technique. CONTRAST:  75m OMNIPAQUE IOHEXOL 350 MG/ML SOLN COMPARISON:  Abdominal imaging from February 25, 2022 FINDINGS: Cardiovascular: Calcified and noncalcified aortic atherosclerotic plaque. No gross acute aortic process though assessment is limited by bolus timing, study performed for evaluation of pulmonary arteries. Heart size is normal  without pericardial effusion or nodularity. There is three-vessel coronary artery disease. Main pulmonary artery is opacified to 4 in 16 Hounsfield units. There is no sign of pulmonary embolism. Posterior division inferior pulmonary vein on the RIGHT and inferior pulmonary vein showing variable attenuation. There is peripheral ground-glass in there is non opacification of posterior division of the RIGHT inferior pulmonary vein seen on image 214/6. All other branches of the pulmonary venous system on the RIGHT and LEFT appear well opacified. Low-attenuation extending into the RIGHT inferior pulmonary vein and low attenuation present at the orifice of the vein in the LEFT atrium. Mediastinum/Nodes: Thoracic inlet is unremarkable. No axillary lymphadenopathy. No mediastinal or gross hilar lymphadenopathy with scattered small lymph nodes in the chest. Lungs/Pleura: Patchy ground-glass opacities  some areas with distinct nodularity, for example on image 55/7 largest area measuring 7 mm discrete from a more geographic area that measures up to 5 cm in the RIGHT upper lobe. Another area of more discrete ground-glass measuring 12 x 9 mm. Patchy areas of ground-glass attenuation in posterior RIGHT lower lobe in areas subtended by the venous abnormality seen above. There is a discrete RIGHT lower lobe nodule measuring 6 mm (image 55/7) Small nodules also present at the LEFT lung base 6 mm nodule on image 90/7 and a 5 mm nodule on image 94/7. Upper Abdomen: Signs of marked cirrhotic hepatic morphology. Liver not well assessed, studies essentially a noncontrast exam in the abdomen. Spleen is moderately enlarged and not well evaluated. Imaged portions of the pancreas, adrenal glands and kidneys are unremarkable without acute gastrointestinal process seen in the upper abdomen. Musculoskeletal: No acute bone finding. No destructive bone process. Spinal degenerative changes. Review of the MIP images confirms the above findings.  IMPRESSION: 1. No sign of pulmonary embolism. 2. Posterior division inferior pulmonary vein on the RIGHT and inferior pulmonary vein showing lack of opacification. This extends to the orifice of the RIGHT inferior pulmonary vein and perhaps even into the periphery of the LEFT atrium. This is moderately suspicious for thrombus made more suspicious by the normal opacification of other pulmonary venous branches in the chest. Consider cardiology consult confirmation with venous phase assessment of the chest is suggested. 3. Multiple areas of patchy ground-glass and discrete nodularity in the RIGHT upper lobe. Findings favor an infectious or inflammatory etiology. 4. Better defined lower lobe nodules largest at 6 mm. Given the multifocal nature of findings in the chest and concern for infection but some indeterminate more discrete nodular areas with suggest 8-12 week follow-up outside of the acute setting to ensure resolution. 5. Marked hepatic cirrhosis 6. Aortic atherosclerosis and coronary artery disease. Aortic Atherosclerosis (ICD10-I70.0). Critical Value/emergent results were called by telephone at the time of interpretation on 07/09/2022 at 12:20 pm to provider [facility]  , who verbally acknowledged these results. Electronically Signed: By: Zetta Bills M.D. On: 07/09/2022 12:20   DG Chest 2 View  Result Date: 07/08/2022 CLINICAL DATA:  Chest pain.  Fever.  Flu symptoms. EXAM: CHEST - 2 VIEW COMPARISON:  03/10/2021 FINDINGS: The heart size and mediastinal contours are within normal limits. Elevation of right hemidiaphragm again noted. Mild asymmetric patchy opacity is seen in the right upper lobe which appears new, suspicious for pneumonia. Evidence of pleural effusion. IMPRESSION: Mild asymmetric patchy opacity in right upper lobe, suspicious for pneumonia. Recommend continued chest radiographic follow-up to confirm resolution. Electronically Signed   By: Marlaine Hind M.D.   On: 07/08/2022 18:48     Pending Labs Unresulted Labs (From admission, onward)     Start     Ordered   07/10/22 0500  Heparin level (unfractionated)  Daily,   R     See Hyperspace for full Linked Orders Report.   07/09/22 1307   07/10/22 0500  CBC  Daily,   R     See Hyperspace for full Linked Orders Report.   07/09/22 1307   07/10/22 [id number]  Basic metabolic panel  Tomorrow morning,   R        07/09/22 1402   07/09/22 2100  Heparin level (unfractionated)  Once-Timed,   TIMED        07/09/22 1307   07/08/22 2345  D-dimer, quantitative  Once,   AD  07/08/22 2345   07/08/22 2323  Strep pneumoniae urinary antigen  Add-on,   AD        07/08/22 2323            Vitals/Pain Today's Vitals   07/09/22 1315 07/09/22 1400 07/09/22 1415 07/09/22 1445  BP: (Abnormal) 145/107 136/69  (Abnormal) 150/70  Pulse: 97 94 94 100  Resp: 16 (Abnormal) 23 19 (Abnormal) 25  Temp:      TempSrc:      SpO2: 99% 100% 99% 100%  Weight:      Height:      PainSc:        Isolation Precautions Droplet precaution  Medications Medications  acetaminophen (TYLENOL) tablet 650 mg (has no administration in time range)    Or  acetaminophen (TYLENOL) suppository 650 mg (has no administration in time range)  melatonin tablet 3 mg (has no administration in time range)  azithromycin (ZITHROMAX) 500 mg in sodium chloride 0.9 % 250 mL IVPB (has no administration in time range)  cefTRIAXone (ROCEPHIN) 1 g in sodium chloride 0.9 % 100 mL IVPB (has no administration in time range)  ondansetron (ZOFRAN) injection 4 mg (has no administration in time range)  insulin aspart (novoLOG) injection 0-6 Units ( Subcutaneous Not Given 07/09/22 1243)  atorvastatin (LIPITOR) tablet 40 mg (40 mg Oral Given 07/09/22 0910)  fenofibrate tablet 160 mg (160 mg Oral Not Given 07/09/22 1017)  fluticasone (FLONASE) 50 MCG/ACT nasal spray 1 spray (1 spray Each Nare Patient Refused/Not Given 07/09/22 0913)  gabapentin (NEURONTIN) capsule 300 mg (300 mg  Oral Given 07/09/22 0006)  losartan (COZAAR) tablet 100 mg (100 mg Oral Given 07/09/22 0910)  PARoxetine (PAXIL) tablet 40 mg (40 mg Oral Given 07/09/22 0910)  QUEtiapine (SEROQUEL) tablet 50 mg (50 mg Oral Given 07/09/22 0910)  heparin ADULT infusion 100 units/mL (25000 units/263m) (1,100 Units/hr Intravenous New Bag/Given 07/09/22 1331)  aspirin EC tablet 81 mg (has no administration in time range)  metoprolol tartrate (LOPRESSOR) tablet 25 mg (has no administration in time range)  cefTRIAXone (ROCEPHIN) 1 g in sodium chloride 0.9 % 100 mL IVPB (0 g Intravenous Stopped 07/09/22 0010)  azithromycin (ZITHROMAX) 500 mg in sodium chloride 0.9 % 250 mL IVPB (0 mg Intravenous Stopped 07/09/22 0109)  sodium chloride 0.9 % bolus 500 mL (0 mLs Intravenous Stopped 07/09/22 0155)  aspirin chewable tablet 162 mg (162 mg Oral Given 07/08/22 2338)  potassium chloride SA (KLOR-CON M) CR tablet 40 mEq (40 mEq Oral Given 07/09/22 0910)  iohexol (OMNIPAQUE) 350 MG/ML injection 75 mL (75 mLs Intravenous Contrast Given 07/09/22 1143)    Mobility walks     Focused Assessments Cardiac Assessment Handoff:    No results found for: "CKTOTAL", "CKMB", "CKMBINDEX", "TROPONINI" No results found for: "DDIMER" Does the Patient currently have chest pain? No   , Pulmonary Assessment Handoff:  Lung sounds:   O2 Device: Room Air      R Recommendations: See Admitting Provider Note  Report given to:   Additional Notes: Alert and orientedx4. Walks to the bathroom

## 2022-07-09 NOTE — Consult Note (Signed)
Stroke Neurology Consultation Note  Consult Requested by: Howerter, [name], DO  Reason for Consult: stroke  Consult Date: 07/09/22   The history was obtained from the pt and wife, During history and examination, all items were  able to obtain unless otherwise noted.  History of Present Illness:  Collin Bridges is a 77 y.o. Caucasian male with PMH of chronic diastolic heart failure, chronic kidney disease stage III, diabetes, hypertension, hyperlipidemia.  Admitted for pneumonia complaining of fever and chills and feeling generally weak.  He is also having some chest pain on the right side and per wife some shortness of breath.  Troponins elevated cardiology consulted.  CTA was completed shows pulmonary embolus.  No bolus heparin started.  MRI shows small scattered cardioembolic strokes.  No history of atrial fibrillation.  He is being followed by Dr. Leonie Man with stroke clinic for TIA.    Past Medical History:  Diagnosis Date   Allergy    seasonal   Anxiety    Depression    Headache    Hepatitis C    HLD (hyperlipidemia) 08/09/2017   Hypertension    Hypoglycemia    Stroke [facility])     Past Surgical History:  Procedure Laterality Date   APPENDECTOMY     BUBBLE STUDY  06/16/2021   Procedure: BUBBLE STUDY;  Surgeon: [name], MD;  Location: [facility];  Service: Cardiovascular;;   TEE WITHOUT CARDIOVERSION N/A 06/16/2021   Procedure: TRANSESOPHAGEAL ECHOCARDIOGRAM (TEE);  Surgeon: [name], MD;  Location: [facility] ENDOSCOPY;  Service: Cardiovascular;  Laterality: N/A;   TONSILECTOMY, ADENOIDECTOMY, BILATERAL MYRINGOTOMY AND TUBES      Family History  Problem Relation Age of Onset   Lung disease Mother    Hyperlipidemia Father    Hypertension Father     Social History:  reports that he has quit smoking. He has never used smokeless tobacco. He reports that he does not drink alcohol and does not use drugs.  Allergies: No Known Allergies  No current  facility-administered medications on file prior to encounter.   Current Outpatient Medications on File Prior to Encounter  Medication Sig Dispense Refill   atorvastatin (LIPITOR) 40 MG tablet Take 1 tablet (40 mg total) by mouth daily. 90 tablet 3   cholecalciferol (VITAMIN D3) 25 MCG (1000 UNIT) tablet Take 1 tablet (1,000 Units total) by mouth daily. Take 1,000 Units by mouth daily. 90 tablet 2   clopidogrel (PLAVIX) 75 MG tablet Take 1 tablet (75 mg total) by mouth daily. 90 tablet 2   fenofibrate 160 MG tablet Take 1 tablet (160 mg total) by mouth daily. 90 tablet 3   fluticasone (FLONASE) 50 MCG/ACT nasal spray USE 2 SPRAYS IN EACH NOSTRIL DAILY (Patient taking differently: Place 2 sprays into both nostrils daily. USE 2 SPRAYS IN EACH NOSTRIL DAILY) 48 g 2   gabapentin (NEURONTIN) 300 MG capsule TAKE 1 CAPSULE BY MOUTH EVERY  NIGHT AT BEDTIME 100 capsule 1   losartan (COZAAR) 100 MG tablet Take 1 tablet (100 mg total) by mouth daily. 90 tablet 3   metFORMIN (GLUCOPHAGE-XR) 500 MG 24 hr tablet Take 1 tablet (500 mg total) by mouth daily with breakfast. 90 tablet 3   Multiple Vitamins-Minerals (HAIR/SKIN/NAILS/BIOTIN PO) Take 1 tablet by mouth daily.     PARoxetine (PAXIL) 40 MG tablet TAKE 1 TABLET(40 MG) BY MOUTH EVERY MORNING (Patient taking differently: Take 40 mg by mouth every morning.) 90 tablet 3   sildenafil (VIAGRA) 100 MG tablet Take  0.5-1 tablets (50-100 mg total) by mouth daily as needed for erectile dysfunction. 5 tablet 11   vitamin E 180 MG (400 UNITS) capsule Take 400 Units by mouth daily.     zolpidem (AMBIEN) 10 MG tablet TAKE 1 TABLET(10 MG) BY MOUTH AT BEDTIME AS NEEDED FOR SLEEP (Patient taking differently: Take 10 mg by mouth See admin instructions. TAKE 1 TABLET(10 MG) BY MOUTH AT BEDTIME AS NEEDED FOR SLEEP) 90 tablet 1   QUEtiapine (SEROQUEL) 50 MG tablet TAKE 1 TABLET(50 MG) BY MOUTH TWICE DAILY (Patient not taking: Reported on 07/09/2022) 180 tablet 1    Review of  Systems: A full ROS was attempted today and was  able to be performed.  Systems assessed include - Constitutional, Eyes, HENT, Respiratory, Cardiovascular, Gastrointestinal, Genitourinary, Integument/breast, Hematologic/lymphatic, Musculoskeletal, Neurological, Behavioral/Psych, Endocrine, Allergic/Immunologic - with pertinent responses as per HPI.  Physical Examination: Temp:  [97.5 F (36.4 C)-99.1 F (37.3 C)] 97.8 F (36.6 C) (02/24 1746) Pulse Rate:  [79-100] 91 (02/24 1530) Resp:  [11-25] 18 (02/24 1746) BP: (123-169)/(68-107) 148/83 (02/24 1746) SpO2:  [98 %-100 %] 99 % (02/24 1746) Weight:  [83.6 kg] 83.6 kg (02/24 1746)  General - well nourished, well developed, in no apparent distress.    Ophthalmologic - fundi not visualized due to noncooperation.    Cardiovascular - regular rhythm and rate  Mental Status -  Level of arousal and orientation to time, place, and person were intact. Language including expression, naming, repetition, comprehension, reading, and writing was assessed and found intact. Attention span and concentration were normal. Recent and remote memory were intact. Fund of Knowledge was assessed and was intact.  Cranial Nerves II - XII - II - Vision intact OU. III, IV, VI - Extraocular movements intact. V - Facial sensation intact bilaterally. VII - Facial movement intact bilaterally. VIII - Hearing & vestibular intact bilaterally. X - Palate elevates symmetrically. XI - Chin turning & shoulder shrug intact bilaterally. XII - Tongue protrusion intact.  Motor Strength - [name] was normal in all extremities and pronator drift was absent.   Motor Tone & Bulk - Muscle tone was assessed at the neck and appendages and was normal.  Bulk was normal and fasciculations were absent.   Reflexes - The patient's reflexes were normal in all extremities and he had no pathological reflexes.  Sensory - Light touch, temperature/pinprick were assessed and  were normal.    Coordination - The patient had normal movements in the hands and feet with no ataxia or dysmetria.  Tremor was absent.  Gait and Station - deferred  Data Reviewed: ECHOCARDIOGRAM COMPLETE  Result Date: 07/09/2022    ECHOCARDIOGRAM REPORT   Patient Name:   Collin Bridges Date of Exam: 07/09/2022 Medical Rec #:  [id number]             Height:       72.0 in Accession #:    [id number]            Weight:       180.0 lb Date of Birth:  1945/08/05            BSA:          2.037 m Patient Age:    75 years              BP:           161/85 mmHg Patient Gender: M  HR:           89 bpm. Exam Location:  Inpatient Procedure: 2D Echo Indications:    elevated troponin  History:        Patient has prior history of Echocardiogram examinations, most                 recent 06/16/2021. CHF, chronic kidney disease. Sepsis.; Risk                 Factors:Diabetes, Hypertension and Dyslipidemia.  Sonographer:    Johny Chess RDCS Referring Phys: [id number] Rhetta Mura  Sonographer Comments: Image acquisition challenging due to respiratory motion. IMPRESSIONS  1. Left ventricular ejection fraction, by estimation, is 60 to 65%. The left ventricle has normal function. The left ventricle demonstrates regional wall motion abnormalities (see scoring diagram/findings for description). Left ventricular diastolic parameters are consistent with Grade I diastolic dysfunction (impaired relaxation).  2. Right ventricular systolic function is hyperdynamic. The right ventricular size is mildly enlarged.  3. Calcified subchordal appatatus and previously describev. The mitral valve is abnormal. Trivial mitral valve regurgitation. No evidence of mitral stenosis. Moderate mitral annular calcification.  4. The aortic valve is abnormal. Aortic valve regurgitation is not visualized. Mild aortic valve stenosis.  5. The pulmonary veins are not sufficiently imaged to evalution for pulmonary vein thrombus  based off of this study. FINDINGS  Left Ventricle: Left ventricular ejection fraction, by estimation, is 60 to 65%. The left ventricle has normal function. The left ventricle demonstrates regional wall motion abnormalities. The left ventricular internal cavity size was normal in size. There is no left ventricular hypertrophy. Left ventricular diastolic parameters are consistent with Grade I diastolic dysfunction (impaired relaxation).  LV Wall Scoring: The basal inferior segment is hypokinetic. Right Ventricle: The right ventricular size is mildly enlarged. No increase in right ventricular wall thickness. Right ventricular systolic function is hyperdynamic. Left Atrium: Left atrial size was normal in size. Right Atrium: Right atrial size was normal in size. Pericardium: There is no evidence of pericardial effusion. Presence of epicardial fat layer. Mitral Valve: Calcified subchordal appatatus and previously describev. The mitral valve is abnormal. Moderate mitral annular calcification. Trivial mitral valve regurgitation. No evidence of mitral valve stenosis. Tricuspid Valve: The tricuspid valve is normal in structure. Tricuspid valve regurgitation is not demonstrated. No evidence of tricuspid stenosis. Aortic Valve: The aortic valve is abnormal. Aortic valve regurgitation is not visualized. Mild aortic stenosis is present. Aortic valve mean gradient measures 10.0 mmHg. Aortic valve peak gradient measures 21.5 mmHg. Aortic valve area, by VTI measures 1.37 cm. Pulmonic Valve: The pulmonary veins are not sufficiently imaged to evalution for pulmonary vein thrombus based off of this study. The pulmonic valve was not well visualized. Pulmonic valve regurgitation is not visualized. Aorta: The aortic root and ascending aorta are structurally normal, with no evidence of dilitation. Venous: The pulmonary veins are not sufficiently imaged to evalution for pulmonary vein thrombus based off of this study. IAS/Shunts: No atrial  level shunt detected by color flow Doppler.  LEFT VENTRICLE PLAX 2D LVIDd:         4.60 cm   Diastology LVIDs:         3.60 cm   LV e' medial:    8.27 cm/s LV PW:         0.90 cm   LV E/e' medial:  11.0 LV IVS:        1.00 cm   LV e' lateral:   8.81 cm/s LVOT  diam:     1.90 cm   LV E/e' lateral: 10.3 LV SV:         61 LV SV Index:   30 LVOT Area:     2.84 cm  RIGHT VENTRICLE RV Basal diam:  2.70 cm RV S prime:     13.80 cm/s TAPSE (M-mode): 3.0 cm LEFT ATRIUM             Index        RIGHT ATRIUM           Index LA diam:        3.70 cm 1.82 cm/m   RA Area:     15.30 cm LA Vol (A2C):   43.1 ml 21.16 ml/m  RA Volume:   38.30 ml  18.80 ml/m LA Vol (A4C):   51.2 ml 25.13 ml/m LA Biplane Vol: 49.5 ml 24.30 ml/m  AORTIC VALVE AV Area (Vmax):    1.16 cm AV Area (Vmean):   1.23 cm AV Area (VTI):     1.37 cm AV Vmax:           232.00 cm/s AV Vmean:          152.000 cm/s AV VTI:            0.448 m AV Peak Grad:      21.5 mmHg AV Mean Grad:      10.0 mmHg LVOT Vmax:         94.85 cm/s LVOT Vmean:        65.750 cm/s LVOT VTI:          0.216 m LVOT/AV VTI ratio: 0.48  AORTA Ao Root diam: 3.50 cm Ao Asc diam:  3.70 cm MITRAL VALVE MV Area (PHT): 4.31 cm     SHUNTS MV Decel Time: 176 msec     Systemic VTI:  0.22 m MV E velocity: 90.70 cm/s   Systemic Diam: 1.90 cm MV A velocity: 116.00 cm/s MV E/A ratio:  0.78 Rudean Haskell MD Electronically signed by Rudean Haskell MD Signature Date/Time: 07/09/2022/1:08:59 PM    Final    MR BRAIN W WO CONTRAST  Result Date: 07/09/2022 CLINICAL DATA:  Study identified as missing a report during evaluation of emergent CTA chest at 12:27 pm on 07/09/2022. 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 EXAM: MRI HEAD WITHOUT AND WITH CONTRAST TECHNIQUE: Multiplanar, multiecho pulse sequences of the brain and surrounding structures were obtained without and with intravenous contrast. CONTRAST:  7.5 mL Vueway COMPARISON:  Noncontrast  brain MRI 04/03/2021. FINDINGS: Brain: There are multiple small foci of abnormal diffusion scattered in the bilateral cerebral hemispheres, and a left lentiform focus (series 3, image 76 and left periventricular white matter focus (image 82) appear to be restricted. Of note, there were similar scattered small hemispheric infarcts suspected on the previous exam in 2022. No associated hemorrhage or mass effect. Chronically advanced cerebral white matter disease with confluent T2 and FLAIR hyperintensity. Some small areas of chronic white matter cystic encephalomalacia. Chronic bilateral deep gray matter nuclei lacunar infarcts also, progressed since 2022. No definite chronic cerebral blood products. Small if any areas of chronic cortical encephalomalacia. Small homogeneously enhancing left tentorium meningioma is 19 by 15 x 7 mm (AP by transverse by CC) and does not appear significantly changed since 2022. no associated edema or significant mass effect. No other abnormal intracranial enhancement or dural thickening identified. Vascular: Major intracranial vascular flow voids are stable since 2022. Following contrast the  major dural venous sinuses are enhancing and appear to be patent. Skull and upper cervical spine: Visualized bone marrow signal is within normal limits. Negative visible cervical spine. Sinuses/Orbits: Stable, negative. Other: Mastoids remain clear. Visible internal auditory structures appear normal. IMPRESSION: 1. Evidence of small recurrent acute or subacute infarcts scattered in both hemispheres, similar to that demonstrated in 2022. No associated hemorrhage or mass effect. See also emergent Chest CTA report 07/09/2022. 2. Underlying progression of chronic small vessel ischemia since 2022. 3. 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 Suspect this is clinically silent. Electronically Signed   By: Genevie Ann M.D.   On: 07/09/2022 12:38   CT Angio Chest Pulmonary Embolism (PE) W or WO  Contrast  Addendum Date: 07/09/2022   ADDENDUM REPORT: 07/09/2022 12:35 ADDENDUM: Of note the patient also underwent a brain MRI of July 08, 2022. Interpretation is pending but preliminarily review with neuroradiology shows stigmata of multiple small infarcts particularly in the LEFT cerebral hemisphere and also likely in the LEFT basal ganglia, most suggestive of embolic phenomenon. This further supports the possibility of pulmonary venous side and LEFT atrial extension of thrombus from RIGHT inferior pulmonary vein. Suggest neurologic and cardiology consultation in this complex patient who also has stigmata of liver disease which is quite severe based on hepatic morphology. Critical Value/emergent results were called by telephone at the time of interpretation on 07/09/2022 at 12:35 pm to provider DAWOOD [facility] , who verbally acknowledged these results. Electronically Signed   By: Zetta Bills M.D.   On: 07/09/2022 12:35   Result Date: 07/09/2022 CLINICAL DATA:  Chest pain, elevated troponin. Rule out pulmonary embolism. EXAM: CT ANGIOGRAPHY CHEST WITH CONTRAST TECHNIQUE: Multidetector CT imaging of the chest was performed using the standard protocol during bolus administration of intravenous contrast. Multiplanar CT image reconstructions and MIPs were obtained to evaluate the vascular anatomy. RADIATION DOSE REDUCTION: This exam was performed according to the departmental dose-optimization program which includes automated exposure control, adjustment of the mA and/or kV according to patient size and/or use of iterative reconstruction technique. CONTRAST:  38m OMNIPAQUE IOHEXOL 350 MG/ML SOLN COMPARISON:  Abdominal imaging from February 25, 2022 FINDINGS: Cardiovascular: Calcified and noncalcified aortic atherosclerotic plaque. No gross acute aortic process though assessment is limited by bolus timing, study performed for evaluation of pulmonary arteries. Heart size is normal without pericardial  effusion or nodularity. There is three-vessel coronary artery disease. Main pulmonary artery is opacified to 4 in 16 Hounsfield units. There is no sign of pulmonary embolism. Posterior division inferior pulmonary vein on the RIGHT and inferior pulmonary vein showing variable attenuation. There is peripheral ground-glass in there is non opacification of posterior division of the RIGHT inferior pulmonary vein seen on image 214/6. All other branches of the pulmonary venous system on the RIGHT and LEFT appear well opacified. Low-attenuation extending into the RIGHT inferior pulmonary vein and low attenuation present at the orifice of the vein in the LEFT atrium. Mediastinum/Nodes: Thoracic inlet is unremarkable. No axillary lymphadenopathy. No mediastinal or gross hilar lymphadenopathy with scattered small lymph nodes in the chest. Lungs/Pleura: Patchy ground-glass opacities some areas with distinct nodularity, for example on image 55/7 largest area measuring 7 mm discrete from a more geographic area that measures up to 5 cm in the RIGHT upper lobe. Another area of more discrete ground-glass measuring 12 x 9 mm. Patchy areas of ground-glass attenuation in posterior RIGHT lower lobe in areas subtended by the venous abnormality seen above. There is a discrete  RIGHT lower lobe nodule measuring 6 mm (image 55/7) Small nodules also present at the LEFT lung base 6 mm nodule on image 90/7 and a 5 mm nodule on image 94/7. Upper Abdomen: Signs of marked cirrhotic hepatic morphology. Liver not well assessed, studies essentially a noncontrast exam in the abdomen. Spleen is moderately enlarged and not well evaluated. Imaged portions of the pancreas, adrenal glands and kidneys are unremarkable without acute gastrointestinal process seen in the upper abdomen. Musculoskeletal: No acute bone finding. No destructive bone process. Spinal degenerative changes. Review of the MIP images confirms the above findings. IMPRESSION: 1. No sign of  pulmonary embolism. 2. Posterior division inferior pulmonary vein on the RIGHT and inferior pulmonary vein showing lack of opacification. This extends to the orifice of the RIGHT inferior pulmonary vein and perhaps even into the periphery of the LEFT atrium. This is moderately suspicious for thrombus made more suspicious by the normal opacification of other pulmonary venous branches in the chest. Consider cardiology consult confirmation with venous phase assessment of the chest is suggested. 3. Multiple areas of patchy ground-glass and discrete nodularity in the RIGHT upper lobe. Findings favor an infectious or inflammatory etiology. 4. Better defined lower lobe nodules largest at 6 mm. Given the multifocal nature of findings in the chest and concern for infection but some indeterminate more discrete nodular areas with suggest 8-12 week follow-up outside of the acute setting to ensure resolution. 5. Marked hepatic cirrhosis 6. Aortic atherosclerosis and coronary artery disease. Aortic Atherosclerosis (ICD10-I70.0). Critical Value/emergent results were called by telephone at the time of interpretation on 07/09/2022 at 12:20 pm to provider DAWOOD [facility] , who verbally acknowledged these results. Electronically Signed: By: Zetta Bills M.D. On: 07/09/2022 12:20   DG Chest 2 View  Result Date: 07/08/2022 CLINICAL DATA:  Chest pain.  Fever.  Flu symptoms. EXAM: CHEST - 2 VIEW COMPARISON:  03/10/2021 FINDINGS: The heart size and mediastinal contours are within normal limits. Elevation of right hemidiaphragm again noted. Mild asymmetric patchy opacity is seen in the right upper lobe which appears new, suspicious for pneumonia. Evidence of pleural effusion. IMPRESSION: Mild asymmetric patchy opacity in right upper lobe, suspicious for pneumonia. Recommend continued chest radiographic follow-up to confirm resolution. Electronically Signed   By: Marlaine Hind M.D.   On: 07/08/2022 18:48    Assessment: 77 y.o. male  history of TIA being followed by Dr. Leonie Man outpatient, heart failure admitted initially for possible sepsis with pneumonia found to have pulmonary embolus on CTA cardiology consulted.  MRI shows scattered bilateral strokes etiology is cardioembolic possible atrial septal defect due to pulmonary embolus.   Plan: - HgbA1c, fasting lipid panel -No load heparin bolus with low goal .03-.05 -Telemetry to look for atrial fibrillation - PT consult, OT consult, Speech consult - Echocardiogram, if negative will likely need TEE and possible TCD - Carotid dopplers - Prophylactic therapy-Anticoagulation on heparin GGT - Risk factor modification - Telemetry monitoring - Frequent neuro checks - Blood pressure control with goal systolic [id number]  - Stroke team to follow   Thank you for this consultation and allowing [place] to participate in the care of this patient.

## 2022-07-09 NOTE — Progress Notes (Addendum)
ANTICOAGULATION CONSULT NOTE - Initial Consult  Pharmacy Consult for heparin Indication: pulmonary embolus  No Known Allergies  Patient Measurements: Height: 6' (182.9 cm) Weight: 81.6 kg (180 lb) IBW/kg (Calculated) : 77.6 Heparin Dosing Weight: TBW  Vital Signs: Temp: 97.5 F (36.4 C) (02/24 1258) BP: 154/77 (02/24 1258) Pulse Rate: 89 (02/24 1258)  Labs: Recent Labs    07/08/22 1811 07/08/22 2025 07/09/22 0456  HGB 13.3  --  12.7*  HCT 38.7*  --  36.8*  PLT 121*  --  99*  CREATININE 1.52*  --  1.33*  TROPONINIHS 49* 675* 1,286*    Estimated Creatinine Clearance: 51.1 mL/min (A) (by C-G formula based on SCr of 1.33 mg/dL (H)).   Medical History: Past Medical History:  Diagnosis Date   Allergy    seasonal   Anxiety    Depression    Headache    Hepatitis C    HLD (hyperlipidemia) 08/09/2017   Hypertension    Hypoglycemia    Stroke [facility])     Assessment: 55 YOM presenting with fever/chills, increased troponin, MRI with infarcts and CT angio chest with pulm vein thrombus.  He is not on anticoagulation PTA, plts 99.  No bolus with stroke protocol + thrombocytopenia   Goal of Therapy:  Heparin level 0.3-0.5 units/ml Monitor platelets by anticoagulation protocol: Yes   Plan:  Heparin gtt at 1100 units/hr, no bolus F/u 8 hour heparin level F/u cards and neuro recs  Bertis Ruddy, PharmD, [place] Pharmacist ED Pharmacist Phone # [phone] 07/09/2022 1:05 PM  -

## 2022-07-10 ENCOUNTER — Inpatient Hospital Stay (HOSPITAL_COMMUNITY): Payer: 59

## 2022-07-10 DIAGNOSIS — I214 Non-ST elevation (NSTEMI) myocardial infarction: Secondary | ICD-10-CM

## 2022-07-10 DIAGNOSIS — I639 Cerebral infarction, unspecified: Secondary | ICD-10-CM | POA: Diagnosis not present

## 2022-07-10 DIAGNOSIS — J189 Pneumonia, unspecified organism: Secondary | ICD-10-CM | POA: Diagnosis not present

## 2022-07-10 DIAGNOSIS — I25118 Atherosclerotic heart disease of native coronary artery with other forms of angina pectoris: Secondary | ICD-10-CM | POA: Diagnosis not present

## 2022-07-10 LAB — CBC
HCT: 38.3 % — ABNORMAL LOW (ref 39.0–52.0)
Hemoglobin: 13.3 g/dL (ref 13.0–17.0)
MCH: 31.7 pg (ref 26.0–34.0)
MCHC: 34.7 g/dL (ref 30.0–36.0)
MCV: 91.2 fL (ref 80.0–100.0)
Platelets: 110 10*3/uL — ABNORMAL LOW (ref 150–400)
RBC: 4.2 MIL/uL — ABNORMAL LOW (ref 4.22–5.81)
RDW: 12.9 % (ref 11.5–15.5)
WBC: 8.7 10*3/uL (ref 4.0–10.5)
nRBC: 0 % (ref 0.0–0.2)

## 2022-07-10 LAB — GLUCOSE, CAPILLARY
Glucose-Capillary: 117 mg/dL — ABNORMAL HIGH (ref 70–99)
Glucose-Capillary: 133 mg/dL — ABNORMAL HIGH (ref 70–99)
Glucose-Capillary: 114 mg/dL — ABNORMAL HIGH (ref 70–99)
Glucose-Capillary: 132 mg/dL — ABNORMAL HIGH (ref 70–99)

## 2022-07-10 LAB — BASIC METABOLIC PANEL
Anion gap: 13 (ref 5–15)
BUN: 12 mg/dL (ref 8–23)
CO2: 20 mmol/L — ABNORMAL LOW (ref 22–32)
Calcium: 8.8 mg/dL — ABNORMAL LOW (ref 8.9–10.3)
Chloride: 107 mmol/L (ref 98–111)
Creatinine, Ser: 1.33 mg/dL — ABNORMAL HIGH (ref 0.61–1.24)
GFR, Estimated: 55 mL/min — ABNORMAL LOW (ref >60)
Glucose, Bld: 116 mg/dL — ABNORMAL HIGH (ref 70–99)
Potassium: 4.5 mmol/L (ref 3.5–5.1)
Sodium: 140 mmol/L (ref 135–145)

## 2022-07-10 LAB — LIPID PANEL
Cholesterol: 214 mg/dL — ABNORMAL HIGH (ref 0–200)
HDL: 48 mg/dL (ref >40)
LDL Cholesterol: 140 mg/dL — ABNORMAL HIGH (ref 0–99)
Total CHOL/HDL Ratio: 4.5 RATIO
Triglycerides: 130 mg/dL (ref <150)
VLDL: 26 mg/dL (ref 0–40)

## 2022-07-10 LAB — HEPARIN LEVEL (UNFRACTIONATED)
Heparin Unfractionated: 0.22 IU/mL — ABNORMAL LOW (ref 0.30–0.70)
Heparin Unfractionated: 0.5 IU/mL (ref 0.30–0.70)

## 2022-07-10 LAB — D-DIMER, QUANTITATIVE: D-Dimer, Quant: 0.91 ug/mL-FEU — ABNORMAL HIGH (ref 0.00–0.50)

## 2022-07-10 MED ORDER — METOPROLOL TARTRATE 50 MG PO TABS
50.0000 mg | ORAL_TABLET | Freq: Two times a day (BID) | ORAL | Status: DC
  Administered 2022-07-10 – 2022-07-12 (×5): 50 mg via ORAL
  Filled 2022-07-10 (×5): qty 1

## 2022-07-10 MED ORDER — SALINE SPRAY 0.65 % NA SOLN
1.0000 | NASAL | Status: DC | PRN
  Administered 2022-07-10: 1 via NASAL
  Filled 2022-07-10: qty 44

## 2022-07-10 MED ORDER — ATORVASTATIN CALCIUM 80 MG PO TABS
80.0000 mg | ORAL_TABLET | Freq: Every day | ORAL | Status: DC
  Administered 2022-07-11 – 2022-07-13 (×3): 80 mg via ORAL
  Filled 2022-07-10 (×3): qty 1

## 2022-07-10 NOTE — Progress Notes (Signed)
STROKE TEAM PROGRESS NOTE   INTERVAL HISTORY: No one is at the bedside. Doing well. No new weakness. Able to eat/swallow without difficulty. On heparin ggt. Had some bloody nose but now resolved.   Vitals:   07/10/22 0452 07/10/22 0807 07/10/22 0926 07/10/22 1241  BP:  (!) 163/79  (!) 112/53  Pulse:  85 89 81  Resp:  19  18  Temp:  97.7 F (36.5 C)  (!) 97.5 F (36.4 C)  TempSrc:  Oral  Oral  SpO2:  94%  94%  Weight: 83.4 kg     Height:       CBC:  Recent Labs  Lab 07/09/22 0456 07/10/22 0742  WBC 10.8* 8.7  NEUTROABS 8.1*  --   HGB 12.7* 13.3  HCT 36.8* 38.3*  MCV 93.2 91.2  PLT 99* [id number]*   Basic Metabolic Panel:  Recent Labs  Lab 07/08/22 2025 07/09/22 0456 07/10/22 0742  NA  --  140 140  K  --  3.6 4.5  CL  --  107 107  CO2  --  25 20*  GLUCOSE  --  138* 116*  BUN  --  16 12  CREATININE  --  1.33* 1.33*  CALCIUM  --  8.8* 8.8*  MG 2.1 2.1  --    Lipid Panel:  Recent Labs  Lab 07/10/22 0742  CHOL 214*  TRIG 130  HDL 48  CHOLHDL 4.5  VLDL 26  LDLCALC 140*   HgbA1c: No results for input(s): "HGBA1C" in the last 168 hours. Urine Drug Screen: No results for input(s): "LABOPIA", "COCAINSCRNUR", "LABBENZ", "AMPHETMU", "THCU", "LABBARB" in the last 168 hours.  Alcohol Level No results for input(s): "ETH" in the last 168 hours.  IMAGING past 24 hours VAS [place] LOWER EXTREMITY VENOUS (DVT)  Result Date: 07/10/2022  Lower Venous DVT Study Patient Name:  Collin Bridges  Date of Exam:   07/09/2022 Medical Rec #: [id number]              Accession #:    [id number] Date of Birth: 1945-09-04             Patient Gender: M Patient Age:   77 years Exam Location:  [facility] Procedure:      VAS [place] LOWER EXTREMITY VENOUS (DVT) Referring Phys: DAWOOD ELGERGAWY --------------------------------------------------------------------------------  Indications: Swelling.  Comparison Study: No prior study on file Performing Technologist: Sharion Dove RVS  Examination  Guidelines: A complete evaluation includes B-mode imaging, spectral Doppler, color Doppler, and power Doppler as needed of all accessible portions of each vessel. Bilateral testing is considered an integral part of a complete examination. Limited examinations for reoccurring indications may be performed as noted. The reflux portion of the exam is performed with the patient in reverse Trendelenburg.  +---------+---------------+---------+-----------+----------+--------------+ RIGHT    CompressibilityPhasicitySpontaneityPropertiesThrombus Aging +---------+---------------+---------+-----------+----------+--------------+ CFV      Full           Yes      Yes                                 +---------+---------------+---------+-----------+----------+--------------+ SFJ      Full                                                        +---------+---------------+---------+-----------+----------+--------------+  FV Prox  Full                                                        +---------+---------------+---------+-----------+----------+--------------+ FV Mid   Full                                                        +---------+---------------+---------+-----------+----------+--------------+ FV DistalFull                                                        +---------+---------------+---------+-----------+----------+--------------+ PFV      Full                                                        +---------+---------------+---------+-----------+----------+--------------+ POP      Full           Yes      Yes                                 +---------+---------------+---------+-----------+----------+--------------+ PTV      Full                                                        +---------+---------------+---------+-----------+----------+--------------+ PERO     Full                                                         +---------+---------------+---------+-----------+----------+--------------+   +---------+---------------+---------+-----------+----------+--------------+ LEFT     CompressibilityPhasicitySpontaneityPropertiesThrombus Aging +---------+---------------+---------+-----------+----------+--------------+ CFV      Full           Yes      Yes                                 +---------+---------------+---------+-----------+----------+--------------+ SFJ      Full                                                        +---------+---------------+---------+-----------+----------+--------------+ FV Prox  Full                                                        +---------+---------------+---------+-----------+----------+--------------+  FV Mid   Full                                                        +---------+---------------+---------+-----------+----------+--------------+ FV DistalFull                                                        +---------+---------------+---------+-----------+----------+--------------+ PFV      Full                                                        +---------+---------------+---------+-----------+----------+--------------+ POP      Full           Yes      Yes                                 +---------+---------------+---------+-----------+----------+--------------+ PTV      Full                                                        +---------+---------------+---------+-----------+----------+--------------+ PERO     Full                                                        +---------+---------------+---------+-----------+----------+--------------+     Summary: BILATERAL: - No evidence of deep vein thrombosis seen in the lower extremities, bilaterally. -No evidence of popliteal cyst, bilaterally.   *See table(s) above for measurements and observations. Electronically signed by Orlie Pollen on 07/10/2022 at 9:30:57 AM.     Final    MR ANGIO HEAD WO CONTRAST  Result Date: 07/10/2022 CLINICAL DATA:  Stroke follow-up EXAM: MRA HEAD WITHOUT CONTRAST TECHNIQUE: Angiographic images of the Circle of Willis were acquired using MRA technique without intravenous contrast. COMPARISON:  None Available. FINDINGS: POSTERIOR CIRCULATION: --Vertebral arteries: Normal --Inferior cerebellar arteries: Normal. --Basilar artery: Normal. --Superior cerebellar arteries: Normal. --Posterior cerebral arteries: Unchanged appearance of the diminutive right PCA ANTERIOR CIRCULATION: --Intracranial internal carotid arteries: Normal. --Anterior cerebral arteries (ACA): Unchanged posteriorly projecting focus from the anterior communicating artery measuring 1 x 2 mm. --Middle cerebral arteries (MCA): Normal. IMPRESSION: 1. No emergent large vessel occlusion or high-grade stenosis. 2. Unchanged appearance of the diminutive right PCA. 3. 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 Electronically Signed   By: Ulyses Jarred M.D.   On: 07/10/2022 03:42    PHYSICAL EXAM General - well nourished, well developed, in no apparent distress.     Ophthalmologic - fundi not visualized due to noncooperation.     Cardiovascular - regular rhythm and rate   Mental Status -  Alert. A/O x  4.    Cranial Nerves II - XII - II - Vision intact OU. III, IV, VI - Extraocular movements intact. V - Facial sensation intact bilaterally. VII - Facial movement intact bilaterally. VIII - Hearing & vestibular intact bilaterally. X - Palate elevates symmetrically. XI - Chin turning & shoulder shrug intact bilaterally. XII - Tongue protrusion intact.   Motor Strength - [name] was normal in all extremities and pronator drift was absent.    Motor Tone & Bulk - Muscle tone was assessed at the neck and appendages and was normal.  Bulk was normal and fasciculations were absent.   Sensory - normal to LT.   Coordination - no ataxia.    Gait  and Station - deferred  ASSESSMENT/PLAN Collin Bridges is a 77 y.o. male history of TIA being followed by Dr. Leonie Man outpatient, heart failure admitted initially for possible sepsis with pneumonia found to have pulmonary embolus on CTA cardiology consulted.  MRI shows scattered bilateral strokes etiology is cardioembolic possible atrial septal defect due to pulmonary embolus.   MR angio: 1-8m ACA aneurysm-unchanged.   MRI : scattered strokes in both hemispheres. 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  Carotid Doppler  pending. 2D Echo 60-65%. Calcified mitral valve. TEE pending(NP to setup). Possible TCD. LDL 140 goal LDL<70.  HgbA1c 6.3 VTE prophylaxis - on heparin ggt.     Diet   Diet regular Room service appropriate? Yes; Fluid consistency: Thin   Plavix prior to admission, now on heparin IV.  Therapy recommendations:  pending. Disposition:  pending.  Hypertension Permissive hypertension (OK if < 220/120) but gradually normalize in 5-7 days Long-term BP goal normotensive  Hyperlipidemia Home meds:  lipitor 421m resumed in hospital LDL 140, goal < 70 High intensity statin now on [id number]. Continue statin at discharge  Diabetes type II Controlled  HgbA1c 6.3, goal < 7.0 CBGs Recent Labs    07/09/22 2116 07/10/22 0606 07/10/22 1127  GLUCAP 153* 117* 133*    SSI  Other Stroke Risk Factors Advanced Age >/= 65 46ospital day # 2    To contact Stroke Continuity provider, please refer to [email or web address], contact General Neurology

## 2022-07-10 NOTE — Progress Notes (Signed)
PROGRESS NOTE    Collin Bridges  [id number] DOB: 1945/09/17 DOA: 07/08/2022 PCP: [name], MD   Chief Complaint  Patient presents with   Chest Pain   Fever   flu symptoms    Brief Narrative:   Collin Bridges is a 77 y.o. male with medical history significant for chronic diastolic heart failure, mild aortic stenosis, CKD 3 ACEs with baseline creatinine 1.3-1.5, type 2 diabetes mellitus, hypertension, hyperlipidemia, who presents to ED secondary to complaints of fever, chills, flu symptoms and chest pain, imaging significant for Pneumonia, he was started on empiric antibiotic coverage, troponins keep trending up, he does endorse pleuritic chest pain, so CTA chest was obtained which was negative for PE, but significant for lack of opacification and pulmonary veins extending into the left atrium suspicious for pulmonary vein thrombosis, patient had MRI brain done as an outpatient 07/08/2022 which was significant for thromboembolic CVA.  Assessment & Plan:   Principal Problem:   CAP (community acquired pneumonia) Active Problems:   Essential hypertension   Mild aortic stenosis   Hyperlipidemia   Stage 3a chronic kidney disease (CKD) (HCC)   DM2 (diabetes mellitus, type 2) (HCC)   Severe sepsis (HCC)   Atypical chest pain   Elevated troponin   Depression   Allergic rhinitis   Chronic diastolic CHF (congestive heart failure) ([place])   Questionable pulmonary vein thrombosis/atherothrombosis -As evident on CTA chest, as well presence of thromboembolic CVAs support this diagnosis. -on full anticoagulation, continue with heparin GTT for now, low-dose protocol given acute CVA as well. -Cardiology assistance greatly appreciated, plan for CTA coronaries, they can arrange for venous study as well for further evaluation to confirm diagnosis.  Acute CVA -MRI brain significant for acute/subacute multiple CVAs in both hemispheres, this is most likely due to above,  started on full anticoagulation, neurology consulted  NSTEMI -2D echo with a preserved EF but with regional wall motion abnormalit. -On heparin gtt. due to above -Continue with continue with aspirin, statin, fenofibrate, Cozaar, started on beta-blockers as well -Cardiology input greatly appreciated  Sepsis secondary to pneumonia -continue with IV Rocephin and azithromycin -Follow on blood cultures  History of aortic stenosis -Cardiology on board  Type 2 diabetes mellitus -A1c 6.3 last January -Continue with insulin sliding scale  Depression -continue with Paxil  Hyperlipidemia -DL is elevated at 142, will increase his Lipitor to 80 mg.  Hypertension -Blood pressure acceptable, continue with losartan  Pulm nodules -Will need repeat imaging in 8 to 12 weeks  Hepatic cirrhosis Thrombocytopenia -Is followed by Dr. Candis Schatz, known history of hep C treated with interferon in 1999   DVT prophylaxis: Heparin GTT Code Status: Full Family Communication: Significant other at bedside Disposition:   Status is: Inpatient    Consultants:  Cardiology Neurology   Subjective:  He reports pleuritic chest pain yesterday but not today, report dyspnea much improved.  Objective: Vitals:   07/10/22 0325 07/10/22 0452 07/10/22 0807 07/10/22 0926  BP: 139/81  (!) 163/79   Pulse:   85 89  Resp:   19   Temp: 98 F (36.7 C)  97.7 F (36.5 C)   TempSrc: Oral  Oral   SpO2: 94%  94%   Weight:  83.4 kg    Height:        Intake/Output Summary (Last 24 hours) at 07/10/2022 1119 Last data filed at 07/10/2022 0807 Gross per 24 hour  Intake 1179.52 ml  Output 1175 ml  Net 4.52 ml   Danley Danker  Weights   07/08/22 1802 07/09/22 1746 07/10/22 0452  Weight: 81.6 kg 83.6 kg 83.4 kg    Examination:  Awake Alert, Oriented X 3, No new F.N deficits, Normal affect Symmetrical Chest wall movement, Good air movement bilaterally, CTAB RRR,No Gallops,Rubs or new Murmurs, No Parasternal  Heave +ve B.Sounds, Abd Soft, No tenderness, No rebound - guarding or rigidity. No Cyanosis, Clubbing ,Trace  edema, No new Rash or bruise      Data Reviewed: I have personally reviewed following labs and imaging studies  CBC: Recent Labs  Lab 07/08/22 1811 07/09/22 0456 07/10/22 0742  WBC 16.6* 10.8* 8.7  NEUTROABS  --  8.1*  --   HGB 13.3 12.7* 13.3  HCT 38.7* 36.8* 38.3*  MCV 92.1 93.2 91.2  PLT 121* 99* 110*    Basic Metabolic Panel: Recent Labs  Lab 07/08/22 1811 07/08/22 2025 07/09/22 0456 07/10/22 0742  NA 135  --  140 140  K 3.9  --  3.6 4.5  CL 102  --  107 107  CO2 22  --  25 20*  GLUCOSE 176*  --  138* 116*  BUN 18  --  16 12  CREATININE 1.52*  --  1.33* 1.33*  CALCIUM 9.4  --  8.8* 8.8*  MG  --  2.1 2.1  --     GFR: Estimated Creatinine Clearance: 51.1 mL/min (A) (by C-G formula based on SCr of 1.33 mg/dL (H)).  Liver Function Tests: Recent Labs  Lab 07/08/22 1811 07/09/22 0456  AST 41 34  ALT 22 20  ALKPHOS 81 72  BILITOT 0.8 0.7  PROT 6.5 5.8*  ALBUMIN 3.7 3.3*    CBG: Recent Labs  Lab 07/09/22 0753 07/09/22 1227 07/09/22 1640 07/09/22 2116 07/10/22 0606  GLUCAP 110* 106* 132* 153* 117*     Recent Results (from the past 240 hour(s))  Resp panel by RT-PCR (RSV, Flu A&B, Covid) Anterior Nasal Swab     Status: None   Collection Time: 07/08/22  6:08 PM   Specimen: Anterior Nasal Swab  Result Value Ref Range Status   SARS Coronavirus 2 by RT PCR NEGATIVE NEGATIVE Final   Influenza A by PCR NEGATIVE NEGATIVE Final   Influenza B by PCR NEGATIVE NEGATIVE Final    Comment: (NOTE) The Xpert Xpress SARS-CoV-2/FLU/RSV plus assay is intended as an aid in the diagnosis of influenza from Nasopharyngeal swab specimens and should not be used as a sole basis for treatment. Nasal washings and aspirates are unacceptable for Xpert Xpress SARS-CoV-2/FLU/RSV testing.  Fact Sheet for Patients: EntrepreneurPulse.com.au  Fact  Sheet for Healthcare Providers: IncredibleEmployment.be  This test is not yet approved or cleared by the [place] FDA and has been authorized for detection and/or diagnosis of SARS-CoV-2 by FDA under an Emergency Use Authorization (EUA). This EUA will remain in effect (meaning this test can be used) for the duration of the COVID-19 declaration under Section 564(b)(1) of the Act, 21 U.S.C. section 360bbb-3(b)(1), unless the authorization is terminated or revoked.     Resp Syncytial Virus by PCR NEGATIVE NEGATIVE Final    Comment: (NOTE) Fact Sheet for Patients: EntrepreneurPulse.com.au  Fact Sheet for Healthcare Providers: IncredibleEmployment.be  This test is not yet approved or cleared by the [place] FDA and has been authorized for detection and/or diagnosis of SARS-CoV-2 by FDA under an Emergency Use Authorization (EUA). This EUA will remain in effect (meaning this test can be used) for the duration of the COVID-19 declaration under Section 564(b)(1) of  the Act, 21 U.S.C. section 360bbb-3(b)(1), unless the authorization is terminated or revoked.  Performed at [facility], [place] [address], [place], [place] [address]   Respiratory (~20 pathogens) panel by PCR     Status: None   Collection Time: 07/08/22  6:08 PM   Specimen: Nasopharyngeal Swab; Respiratory  Result Value Ref Range Status   Adenovirus NOT DETECTED NOT DETECTED Final   Coronavirus 229E NOT DETECTED NOT DETECTED Final    Comment: (NOTE) The Coronavirus on the Respiratory Panel, DOES NOT test for the novel  Coronavirus (2019 nCoV)    Coronavirus HKU1 NOT DETECTED NOT DETECTED Final   Coronavirus NL63 NOT DETECTED NOT DETECTED Final   Coronavirus OC43 NOT DETECTED NOT DETECTED Final   Metapneumovirus NOT DETECTED NOT DETECTED Final   Rhinovirus / Enterovirus NOT DETECTED NOT DETECTED Final   Influenza A NOT DETECTED NOT DETECTED Final    Influenza B NOT DETECTED NOT DETECTED Final   Parainfluenza Virus 1 NOT DETECTED NOT DETECTED Final   Parainfluenza Virus 2 NOT DETECTED NOT DETECTED Final   Parainfluenza Virus 3 NOT DETECTED NOT DETECTED Final   Parainfluenza Virus 4 NOT DETECTED NOT DETECTED Final   Respiratory Syncytial Virus NOT DETECTED NOT DETECTED Final   Bordetella pertussis NOT DETECTED NOT DETECTED Final   Bordetella Parapertussis NOT DETECTED NOT DETECTED Final   Chlamydophila pneumoniae NOT DETECTED NOT DETECTED Final   Mycoplasma pneumoniae NOT DETECTED NOT DETECTED Final    Comment: Performed at [facility], [place]. [address] [place], [place] [address]  Culture, blood (routine x 2)     Status: None (Preliminary result)   Collection Time: 07/08/22 10:24 PM   Specimen: BLOOD  Result Value Ref Range Status   Specimen Description BLOOD BLOOD RIGHT HAND  Final   Special Requests   Final    BOTTLES DRAWN AEROBIC AND ANAEROBIC Blood Culture results may not be optimal due to an inadequate volume of blood received in culture bottles   Culture   Final    NO GROWTH 2 DAYS Performed at [facility], [place] [address], [place], [place] [address]    Report Status PENDING  Incomplete  Culture, blood (routine x 2)     Status: None (Preliminary result)   Collection Time: 07/08/22 11:15 PM   Specimen: BLOOD  Result Value Ref Range Status   Specimen Description BLOOD SITE NOT SPECIFIED  Final   Special Requests   Final    BOTTLES DRAWN AEROBIC AND ANAEROBIC Blood Culture results may not be optimal due to an inadequate volume of blood received in culture bottles   Culture   Final    NO GROWTH 2 DAYS Performed at [facility], [place] [address] [place], [place] [address]    Report Status PENDING  Incomplete         Radiology Studies: VAS [place] LOWER EXTREMITY VENOUS (DVT)  Result Date: 07/10/2022  Lower Venous DVT Study Patient Name:  HENDRIX RUDENKO  Date of Exam:   07/09/2022 Medical Rec #:  [id number]              Accession #:    [id number] Date of Birth: Jun 27, 1945             Patient Gender: M Patient Age:   60 years Exam Location:  [facility] Procedure:      VAS [place] LOWER EXTREMITY VENOUS (DVT) Referring Phys: Misbah Hornaday --------------------------------------------------------------------------------  Indications: Swelling.  Comparison Study: No  prior study on file Performing Technologist: Sharion Dove RVS  Examination Guidelines: A complete evaluation includes B-mode imaging, spectral Doppler, color Doppler, and power Doppler as needed of all accessible portions of each vessel. Bilateral testing is considered an integral part of a complete examination. Limited examinations for reoccurring indications may be performed as noted. The reflux portion of the exam is performed with the patient in reverse Trendelenburg.  +---------+---------------+---------+-----------+----------+--------------+ RIGHT    CompressibilityPhasicitySpontaneityPropertiesThrombus Aging +---------+---------------+---------+-----------+----------+--------------+ CFV      Full           Yes      Yes                                 +---------+---------------+---------+-----------+----------+--------------+ SFJ      Full                                                        +---------+---------------+---------+-----------+----------+--------------+ FV Prox  Full                                                        +---------+---------------+---------+-----------+----------+--------------+ FV Mid   Full                                                        +---------+---------------+---------+-----------+----------+--------------+ FV DistalFull                                                        +---------+---------------+---------+-----------+----------+--------------+ PFV      Full                                                         +---------+---------------+---------+-----------+----------+--------------+ POP      Full           Yes      Yes                                 +---------+---------------+---------+-----------+----------+--------------+ PTV      Full                                                        +---------+---------------+---------+-----------+----------+--------------+ PERO     Full                                                        +---------+---------------+---------+-----------+----------+--------------+   +---------+---------------+---------+-----------+----------+--------------+  LEFT     CompressibilityPhasicitySpontaneityPropertiesThrombus Aging +---------+---------------+---------+-----------+----------+--------------+ CFV      Full           Yes      Yes                                 +---------+---------------+---------+-----------+----------+--------------+ SFJ      Full                                                        +---------+---------------+---------+-----------+----------+--------------+ FV Prox  Full                                                        +---------+---------------+---------+-----------+----------+--------------+ FV Mid   Full                                                        +---------+---------------+---------+-----------+----------+--------------+ FV DistalFull                                                        +---------+---------------+---------+-----------+----------+--------------+ PFV      Full                                                        +---------+---------------+---------+-----------+----------+--------------+ POP      Full           Yes      Yes                                 +---------+---------------+---------+-----------+----------+--------------+ PTV      Full                                                         +---------+---------------+---------+-----------+----------+--------------+ PERO     Full                                                        +---------+---------------+---------+-----------+----------+--------------+     Summary: BILATERAL: - No evidence of deep vein thrombosis seen in the lower extremities, bilaterally. -No evidence of popliteal cyst, bilaterally.   *See table(s) above for measurements and observations. Electronically signed by Orlie Pollen on 07/10/2022 at 9:30:57 AM.  Final    MR ANGIO HEAD WO CONTRAST  Result Date: 07/10/2022 CLINICAL DATA:  Stroke follow-up EXAM: MRA HEAD WITHOUT CONTRAST TECHNIQUE: Angiographic images of the Circle of Willis were acquired using MRA technique without intravenous contrast. COMPARISON:  None Available. FINDINGS: POSTERIOR CIRCULATION: --Vertebral arteries: Normal --Inferior cerebellar arteries: Normal. --Basilar artery: Normal. --Superior cerebellar arteries: Normal. --Posterior cerebral arteries: Unchanged appearance of the diminutive right PCA ANTERIOR CIRCULATION: --Intracranial internal carotid arteries: Normal. --Anterior cerebral arteries (ACA): Unchanged posteriorly projecting focus from the anterior communicating artery measuring 1 x 2 mm. --Middle cerebral arteries (MCA): Normal. IMPRESSION: 1. No emergent large vessel occlusion or high-grade stenosis. 2. Unchanged appearance of the diminutive right PCA. 3. 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 Electronically Signed   By: Ulyses Jarred M.D.   On: 07/10/2022 03:42   ECHOCARDIOGRAM COMPLETE  Result Date: 07/09/2022    ECHOCARDIOGRAM REPORT   Patient Name:   Collin Bridges Date of Exam: 07/09/2022 Medical Rec #:  [id number]             Height:       72.0 in Accession #:    [id number]            Weight:       180.0 lb Date of Birth:  Feb 06, 1946            BSA:          2.037 m Patient Age:    58 years              BP:           161/85 mmHg Patient Gender: M                      HR:           89 bpm. Exam Location:  Inpatient Procedure: 2D Echo Indications:    elevated troponin  History:        Patient has prior history of Echocardiogram examinations, most                 recent 06/16/2021. CHF, chronic kidney disease. Sepsis.; Risk                 Factors:Diabetes, Hypertension and Dyslipidemia.  Sonographer:    Johny Chess RDCS Referring Phys: [id number] Rhetta Mura  Sonographer Comments: Image acquisition challenging due to respiratory motion. IMPRESSIONS  1. Left ventricular ejection fraction, by estimation, is 60 to 65%. The left ventricle has normal function. The left ventricle demonstrates regional wall motion abnormalities (see scoring diagram/findings for description). Left ventricular diastolic parameters are consistent with Grade I diastolic dysfunction (impaired relaxation).  2. Right ventricular systolic function is hyperdynamic. The right ventricular size is mildly enlarged.  3. Calcified subchordal appatatus and previously describev. The mitral valve is abnormal. Trivial mitral valve regurgitation. No evidence of mitral stenosis. Moderate mitral annular calcification.  4. The aortic valve is abnormal. Aortic valve regurgitation is not visualized. Mild aortic valve stenosis.  5. The pulmonary veins are not sufficiently imaged to evalution for pulmonary vein thrombus based off of this study. FINDINGS  Left Ventricle: Left ventricular ejection fraction, by estimation, is 60 to 65%. The left ventricle has normal function. The left ventricle demonstrates regional wall motion abnormalities. The left ventricular internal cavity size was normal in size. There is no left ventricular hypertrophy. Left ventricular diastolic parameters are consistent with Grade I diastolic dysfunction (impaired relaxation).  LV Wall Scoring:  The basal inferior segment is hypokinetic. Right Ventricle: The right ventricular size is mildly enlarged. No increase in right ventricular  wall thickness. Right ventricular systolic function is hyperdynamic. Left Atrium: Left atrial size was normal in size. Right Atrium: Right atrial size was normal in size. Pericardium: There is no evidence of pericardial effusion. Presence of epicardial fat layer. Mitral Valve: Calcified subchordal appatatus and previously describev. The mitral valve is abnormal. Moderate mitral annular calcification. Trivial mitral valve regurgitation. No evidence of mitral valve stenosis. Tricuspid Valve: The tricuspid valve is normal in structure. Tricuspid valve regurgitation is not demonstrated. No evidence of tricuspid stenosis. Aortic Valve: The aortic valve is abnormal. Aortic valve regurgitation is not visualized. Mild aortic stenosis is present. Aortic valve mean gradient measures 10.0 mmHg. Aortic valve peak gradient measures 21.5 mmHg. Aortic valve area, by VTI measures 1.37 cm. Pulmonic Valve: The pulmonary veins are not sufficiently imaged to evalution for pulmonary vein thrombus based off of this study. The pulmonic valve was not well visualized. Pulmonic valve regurgitation is not visualized. Aorta: The aortic root and ascending aorta are structurally normal, with no evidence of dilitation. Venous: The pulmonary veins are not sufficiently imaged to evalution for pulmonary vein thrombus based off of this study. IAS/Shunts: No atrial level shunt detected by color flow Doppler.  LEFT VENTRICLE PLAX 2D LVIDd:         4.60 cm   Diastology LVIDs:         3.60 cm   LV e' medial:    8.27 cm/s LV PW:         0.90 cm   LV E/e' medial:  11.0 LV IVS:        1.00 cm   LV e' lateral:   8.81 cm/s LVOT diam:     1.90 cm   LV E/e' lateral: 10.3 LV SV:         61 LV SV Index:   30 LVOT Area:     2.84 cm  RIGHT VENTRICLE RV Basal diam:  2.70 cm RV S prime:     13.80 cm/s TAPSE (M-mode): 3.0 cm LEFT ATRIUM             Index        RIGHT ATRIUM           Index LA diam:        3.70 cm 1.82 cm/m   RA Area:     15.30 cm LA Vol (A2C):    43.1 ml 21.16 ml/m  RA Volume:   38.30 ml  18.80 ml/m LA Vol (A4C):   51.2 ml 25.13 ml/m LA Biplane Vol: 49.5 ml 24.30 ml/m  AORTIC VALVE AV Area (Vmax):    1.16 cm AV Area (Vmean):   1.23 cm AV Area (VTI):     1.37 cm AV Vmax:           232.00 cm/s AV Vmean:          152.000 cm/s AV VTI:            0.448 m AV Peak Grad:      21.5 mmHg AV Mean Grad:      10.0 mmHg LVOT Vmax:         94.85 cm/s LVOT Vmean:        65.750 cm/s LVOT VTI:          0.216 m LVOT/AV VTI ratio: 0.48  AORTA Ao Root diam: 3.50 cm Ao Asc diam:  3.70 cm  MITRAL VALVE MV Area (PHT): 4.31 cm     SHUNTS MV Decel Time: 176 msec     Systemic VTI:  0.22 m MV E velocity: 90.70 cm/s   Systemic Diam: 1.90 cm MV A velocity: 116.00 cm/s MV E/A ratio:  0.78 Rudean Haskell MD Electronically signed by Rudean Haskell MD Signature Date/Time: 07/09/2022/1:08:59 PM    Final    MR BRAIN W WO CONTRAST  Result Date: 07/09/2022 CLINICAL DATA:  Study identified as missing a report during evaluation of emergent CTA chest at 12:27 pm on 07/09/2022. 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 EXAM: MRI HEAD WITHOUT AND WITH CONTRAST TECHNIQUE: Multiplanar, multiecho pulse sequences of the brain and surrounding structures were obtained without and with intravenous contrast. CONTRAST:  7.5 mL Vueway COMPARISON:  Noncontrast brain MRI 04/03/2021. FINDINGS: Brain: There are multiple small foci of abnormal diffusion scattered in the bilateral cerebral hemispheres, and a left lentiform focus (series 3, image 76 and left periventricular white matter focus (image 82) appear to be restricted. Of note, there were similar scattered small hemispheric infarcts suspected on the previous exam in 2022. No associated hemorrhage or mass effect. Chronically advanced cerebral white matter disease with confluent T2 and FLAIR hyperintensity. Some small areas of chronic white matter cystic encephalomalacia. Chronic bilateral deep  gray matter nuclei lacunar infarcts also, progressed since 2022. No definite chronic cerebral blood products. Small if any areas of chronic cortical encephalomalacia. Small homogeneously enhancing left tentorium meningioma is 19 by 15 x 7 mm (AP by transverse by CC) and does not appear significantly changed since 2022. no associated edema or significant mass effect. No other abnormal intracranial enhancement or dural thickening identified. Vascular: Major intracranial vascular flow voids are stable since 2022. Following contrast the major dural venous sinuses are enhancing and appear to be patent. Skull and upper cervical spine: Visualized bone marrow signal is within normal limits. Negative visible cervical spine. Sinuses/Orbits: Stable, negative. Other: Mastoids remain clear. Visible internal auditory structures appear normal. IMPRESSION: 1. Evidence of small recurrent acute or subacute infarcts scattered in both hemispheres, similar to that demonstrated in 2022. No associated hemorrhage or mass effect. See also emergent Chest CTA report 07/09/2022. 2. Underlying progression of chronic small vessel ischemia since 2022. 3. 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 Suspect this is clinically silent. Electronically Signed   By: Genevie Ann M.D.   On: 07/09/2022 12:38   CT Angio Chest Pulmonary Embolism (PE) W or WO Contrast  Addendum Date: 07/09/2022   ADDENDUM REPORT: 07/09/2022 12:35 ADDENDUM: Of note the patient also underwent a brain MRI of July 08, 2022. Interpretation is pending but preliminarily review with neuroradiology shows stigmata of multiple small infarcts particularly in the LEFT cerebral hemisphere and also likely in the LEFT basal ganglia, most suggestive of embolic phenomenon. This further supports the possibility of pulmonary venous side and LEFT atrial extension of thrombus from RIGHT inferior pulmonary vein. Suggest neurologic and cardiology consultation in this complex patient  who also has stigmata of liver disease which is quite severe based on hepatic morphology. Critical Value/emergent results were called by telephone at the time of interpretation on 07/09/2022 at 12:35 pm to provider Lisseth Brazeau [facility] , who verbally acknowledged these results. Electronically Signed   By: Zetta Bills M.D.   On: 07/09/2022 12:35   Result Date: 07/09/2022 CLINICAL DATA:  Chest pain, elevated troponin. Rule out pulmonary embolism. EXAM: CT ANGIOGRAPHY CHEST WITH CONTRAST TECHNIQUE: Multidetector CT imaging of  the chest was performed using the standard protocol during bolus administration of intravenous contrast. Multiplanar CT image reconstructions and MIPs were obtained to evaluate the vascular anatomy. RADIATION DOSE REDUCTION: This exam was performed according to the departmental dose-optimization program which includes automated exposure control, adjustment of the mA and/or kV according to patient size and/or use of iterative reconstruction technique. CONTRAST:  73m OMNIPAQUE IOHEXOL 350 MG/ML SOLN COMPARISON:  Abdominal imaging from February 25, 2022 FINDINGS: Cardiovascular: Calcified and noncalcified aortic atherosclerotic plaque. No gross acute aortic process though assessment is limited by bolus timing, study performed for evaluation of pulmonary arteries. Heart size is normal without pericardial effusion or nodularity. There is three-vessel coronary artery disease. Main pulmonary artery is opacified to 4 in 16 Hounsfield units. There is no sign of pulmonary embolism. Posterior division inferior pulmonary vein on the RIGHT and inferior pulmonary vein showing variable attenuation. There is peripheral ground-glass in there is non opacification of posterior division of the RIGHT inferior pulmonary vein seen on image 214/6. All other branches of the pulmonary venous system on the RIGHT and LEFT appear well opacified. Low-attenuation extending into the RIGHT inferior pulmonary vein and low  attenuation present at the orifice of the vein in the LEFT atrium. Mediastinum/Nodes: Thoracic inlet is unremarkable. No axillary lymphadenopathy. No mediastinal or gross hilar lymphadenopathy with scattered small lymph nodes in the chest. Lungs/Pleura: Patchy ground-glass opacities some areas with distinct nodularity, for example on image 55/7 largest area measuring 7 mm discrete from a more geographic area that measures up to 5 cm in the RIGHT upper lobe. Another area of more discrete ground-glass measuring 12 x 9 mm. Patchy areas of ground-glass attenuation in posterior RIGHT lower lobe in areas subtended by the venous abnormality seen above. There is a discrete RIGHT lower lobe nodule measuring 6 mm (image 55/7) Small nodules also present at the LEFT lung base 6 mm nodule on image 90/7 and a 5 mm nodule on image 94/7. Upper Abdomen: Signs of marked cirrhotic hepatic morphology. Liver not well assessed, studies essentially a noncontrast exam in the abdomen. Spleen is moderately enlarged and not well evaluated. Imaged portions of the pancreas, adrenal glands and kidneys are unremarkable without acute gastrointestinal process seen in the upper abdomen. Musculoskeletal: No acute bone finding. No destructive bone process. Spinal degenerative changes. Review of the MIP images confirms the above findings. IMPRESSION: 1. No sign of pulmonary embolism. 2. Posterior division inferior pulmonary vein on the RIGHT and inferior pulmonary vein showing lack of opacification. This extends to the orifice of the RIGHT inferior pulmonary vein and perhaps even into the periphery of the LEFT atrium. This is moderately suspicious for thrombus made more suspicious by the normal opacification of other pulmonary venous branches in the chest. Consider cardiology consult confirmation with venous phase assessment of the chest is suggested. 3. Multiple areas of patchy ground-glass and discrete nodularity in the RIGHT upper lobe. Findings  favor an infectious or inflammatory etiology. 4. Better defined lower lobe nodules largest at 6 mm. Given the multifocal nature of findings in the chest and concern for infection but some indeterminate more discrete nodular areas with suggest 8-12 week follow-up outside of the acute setting to ensure resolution. 5. Marked hepatic cirrhosis 6. Aortic atherosclerosis and coronary artery disease. Aortic Atherosclerosis (ICD10-I70.0). Critical Value/emergent results were called by telephone at the time of interpretation on 07/09/2022 at 12:20 pm [facility] verbally acknowledged these results. Electronically Signed: By: GZetta BillsM.D. On: 07/09/2022  12:20   DG Chest 2 View  Result Date: 07/08/2022 CLINICAL DATA:  Chest pain.  Fever.  Flu symptoms. EXAM: CHEST - 2 VIEW COMPARISON:  03/10/2021 FINDINGS: The heart size and mediastinal contours are within normal limits. Elevation of right hemidiaphragm again noted. Mild asymmetric patchy opacity is seen in the right upper lobe which appears new, suspicious for pneumonia. Evidence of pleural effusion. IMPRESSION: Mild asymmetric patchy opacity in right upper lobe, suspicious for pneumonia. Recommend continued chest radiographic follow-up to confirm resolution. Electronically Signed   By: Marlaine Hind M.D.   On: 07/08/2022 18:48        Scheduled Meds:  aspirin EC  81 mg Oral Daily   atorvastatin  40 mg Oral Daily   fenofibrate  160 mg Oral Daily   fluticasone  1 spray Each Nare Daily   gabapentin  300 mg Oral QHS   insulin aspart  0-6 Units Subcutaneous TID WC   losartan  100 mg Oral Daily   metoprolol tartrate  50 mg Oral BID   PARoxetine  40 mg Oral Daily   pneumococcal 20-valent conjugate vaccine  0.5 mL Intramuscular Tomorrow-1000   QUEtiapine  50 mg Oral BID   Continuous Infusions:  azithromycin Stopped (07/09/22 2303)   cefTRIAXone (ROCEPHIN)  IV Stopped (07/09/22 2159)   heparin 1,300 Units/hr (07/10/22 1028)      LOS: 2 days      Phillips Climes, MD [facility]   To contact the attending provider between 7A-7P or the covering provider during after hours 7P-7A, please log into the web site [email or web address] and access using universal [place] password for that web site. If you do not have the password, please call the hospital operator.  07/10/2022, 11:19 AM

## 2022-07-10 NOTE — Progress Notes (Signed)
Rounding Note    Patient Name: Collin Bridges Date of Encounter: 07/10/2022  [facility] Cardiologist: None   Subjective   Feeling much better this morning. No chest pain or SOB. Walking around the room.  Inpatient Medications    Scheduled Meds:  aspirin EC  81 mg Oral Daily   [START ON 07/11/2022] atorvastatin  80 mg Oral Daily   fenofibrate  160 mg Oral Daily   fluticasone  1 spray Each Nare Daily   gabapentin  300 mg Oral QHS   insulin aspart  0-6 Units Subcutaneous TID WC   losartan  100 mg Oral Daily   metoprolol tartrate  50 mg Oral BID   PARoxetine  40 mg Oral Daily   pneumococcal 20-valent conjugate vaccine  0.5 mL Intramuscular Tomorrow-1000   QUEtiapine  50 mg Oral BID   Continuous Infusions:  azithromycin Stopped (07/09/22 2303)   cefTRIAXone (ROCEPHIN)  IV Stopped (07/09/22 2159)   heparin 1,300 Units/hr (07/10/22 1800)   PRN Meds: acetaminophen **OR** acetaminophen, melatonin, ondansetron (ZOFRAN) IV, sodium chloride   Vital Signs    Vitals:   07/10/22 0807 07/10/22 0926 07/10/22 1241 07/10/22 1608  BP: (!) 163/79  (!) 112/53 119/69  Pulse: 85 89 81 76  Resp: 19  18 18  $ Temp: 97.7 F (36.5 C)  (!) 97.5 F (36.4 C) 98 F (36.7 C)  TempSrc: Oral  Oral Oral  SpO2: 94%  94% 93%  Weight:      Height:        Intake/Output Summary (Last 24 hours) at 07/10/2022 1931 Last data filed at 07/10/2022 1800 Gross per 24 hour  Intake 1501.48 ml  Output 1825 ml  Net -323.52 ml       07/10/2022    4:52 AM 07/09/2022    5:46 PM 07/08/2022    6:02 PM  Last 3 Weights  Weight (lbs) 183 lb 14.4 oz 184 lb 3.2 oz 180 lb  Weight (kg) 83.416 kg 83.553 kg 81.647 kg      Telemetry    NSR- Personally Reviewed  ECG    No new tracing today- Personally Reviewed  Physical Exam   GEN: No acute distress. Comfortable  Neck: No JVD Cardiac: RRR, 2/6 harsh, early peaking systolic murmur Respiratory: CTAB, no wheezes GI: Soft, nontender,  non-distended  MS: No edema, warm Neuro:  Nonfocal  Psych: Normal affect   Labs    High Sensitivity Troponin:   Recent Labs  Lab 07/08/22 1811 07/08/22 2025 07/09/22 0456  TROPONINIHS 49* 675* 1,286*      Chemistry Recent Labs  Lab 07/08/22 1811 07/08/22 2025 07/09/22 0456 07/10/22 0742  NA 135  --  140 140  K 3.9  --  3.6 4.5  CL 102  --  107 107  CO2 22  --  25 20*  GLUCOSE 176*  --  138* 116*  BUN 18  --  16 12  CREATININE 1.52*  --  1.33* 1.33*  CALCIUM 9.4  --  8.8* 8.8*  MG  --  2.1 2.1  --   PROT 6.5  --  5.8*  --   ALBUMIN 3.7  --  3.3*  --   AST 41  --  34  --   ALT 22  --  20  --   ALKPHOS 81  --  72  --   BILITOT 0.8  --  0.7  --   GFRNONAA 47*  --  55* 55*  ANIONGAP 11  --  8 13     Lipids  Recent Labs  Lab 07/10/22 0742  CHOL 214*  TRIG 130  HDL 48  LDLCALC 140*  CHOLHDL 4.5    Hematology Recent Labs  Lab 07/08/22 1811 07/09/22 0456 07/10/22 0742  WBC 16.6* 10.8* 8.7  RBC 4.20* 3.95* 4.20*  HGB 13.3 12.7* 13.3  HCT 38.7* 36.8* 38.3*  MCV 92.1 93.2 91.2  MCH 31.7 32.2 31.7  MCHC 34.4 34.5 34.7  RDW 12.8 12.9 12.9  PLT 121* 99* 110*    Thyroid No results for input(s): "TSH", "FREET4" in the last 168 hours.  BNPNo results for input(s): "BNP", "PROBNP" in the last 168 hours.  DDimer  Recent Labs  Lab 07/09/22 2146  DDIMER 0.91*      Radiology    VAS [place] LOWER EXTREMITY VENOUS (DVT)  Result Date: 07/10/2022  Lower Venous DVT Study Patient Name:  Collin Bridges  Date of Exam:   07/09/2022 Medical Rec #: [id number]              Accession #:    [id number] Date of Birth: 1945-12-18             Patient Gender: M Patient Age:   77 years Exam Location:  [facility] Procedure:      VAS [place] LOWER EXTREMITY VENOUS (DVT) Referring Phys: DAWOOD ELGERGAWY --------------------------------------------------------------------------------  Indications: Swelling.  Comparison Study: No prior study on file Performing Technologist:  Sharion Dove RVS  Examination Guidelines: A complete evaluation includes B-mode imaging, spectral Doppler, color Doppler, and power Doppler as needed of all accessible portions of each vessel. Bilateral testing is considered an integral part of a complete examination. Limited examinations for reoccurring indications may be performed as noted. The reflux portion of the exam is performed with the patient in reverse Trendelenburg.  +---------+---------------+---------+-----------+----------+--------------+ RIGHT    CompressibilityPhasicitySpontaneityPropertiesThrombus Aging +---------+---------------+---------+-----------+----------+--------------+ CFV      Full           Yes      Yes                                 +---------+---------------+---------+-----------+----------+--------------+ SFJ      Full                                                        +---------+---------------+---------+-----------+----------+--------------+ FV Prox  Full                                                        +---------+---------------+---------+-----------+----------+--------------+ FV Mid   Full                                                        +---------+---------------+---------+-----------+----------+--------------+ FV DistalFull                                                        +---------+---------------+---------+-----------+----------+--------------+  PFV      Full                                                        +---------+---------------+---------+-----------+----------+--------------+ POP      Full           Yes      Yes                                 +---------+---------------+---------+-----------+----------+--------------+ PTV      Full                                                        +---------+---------------+---------+-----------+----------+--------------+ PERO     Full                                                         +---------+---------------+---------+-----------+----------+--------------+   +---------+---------------+---------+-----------+----------+--------------+ LEFT     CompressibilityPhasicitySpontaneityPropertiesThrombus Aging +---------+---------------+---------+-----------+----------+--------------+ CFV      Full           Yes      Yes                                 +---------+---------------+---------+-----------+----------+--------------+ SFJ      Full                                                        +---------+---------------+---------+-----------+----------+--------------+ FV Prox  Full                                                        +---------+---------------+---------+-----------+----------+--------------+ FV Mid   Full                                                        +---------+---------------+---------+-----------+----------+--------------+ FV DistalFull                                                        +---------+---------------+---------+-----------+----------+--------------+ PFV      Full                                                        +---------+---------------+---------+-----------+----------+--------------+  POP      Full           Yes      Yes                                 +---------+---------------+---------+-----------+----------+--------------+ PTV      Full                                                        +---------+---------------+---------+-----------+----------+--------------+ PERO     Full                                                        +---------+---------------+---------+-----------+----------+--------------+     Summary: BILATERAL: - No evidence of deep vein thrombosis seen in the lower extremities, bilaterally. -No evidence of popliteal cyst, bilaterally.   *See table(s) above for measurements and observations. Electronically signed by Orlie Pollen on 07/10/2022 at 9:30:57  AM.    Final    MR ANGIO HEAD WO CONTRAST  Result Date: 07/10/2022 CLINICAL DATA:  Stroke follow-up EXAM: MRA HEAD WITHOUT CONTRAST TECHNIQUE: Angiographic images of the Circle of Willis were acquired using MRA technique without intravenous contrast. COMPARISON:  None Available. FINDINGS: POSTERIOR CIRCULATION: --Vertebral arteries: Normal --Inferior cerebellar arteries: Normal. --Basilar artery: Normal. --Superior cerebellar arteries: Normal. --Posterior cerebral arteries: Unchanged appearance of the diminutive right PCA ANTERIOR CIRCULATION: --Intracranial internal carotid arteries: Normal. --Anterior cerebral arteries (ACA): Unchanged posteriorly projecting focus from the anterior communicating artery measuring 1 x 2 mm. --Middle cerebral arteries (MCA): Normal. IMPRESSION: 1. No emergent large vessel occlusion or high-grade stenosis. 2. Unchanged appearance of the diminutive right PCA. 3. 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 Electronically Signed   By: Ulyses Jarred M.D.   On: 07/10/2022 03:42   ECHOCARDIOGRAM COMPLETE  Result Date: 07/09/2022    ECHOCARDIOGRAM REPORT   Patient Name:   Collin Bridges Date of Exam: 07/09/2022 Medical Rec #:  [id number]             Height:       72.0 in Accession #:    [id number]            Weight:       180.0 lb Date of Birth:  08-18-1945            BSA:          2.037 m Patient Age:    37 years              BP:           161/85 mmHg Patient Gender: M                     HR:           89 bpm. Exam Location:  Inpatient Procedure: 2D Echo Indications:    elevated troponin  History:        Patient has prior history of Echocardiogram examinations, most                 recent 06/16/2021. CHF, chronic kidney disease. Sepsis.;  Risk                 Factors:Diabetes, Hypertension and Dyslipidemia.  Sonographer:    Johny Chess RDCS Referring Phys: [id number] Rhetta Mura  Sonographer Comments: Image acquisition challenging due to respiratory  motion. IMPRESSIONS  1. Left ventricular ejection fraction, by estimation, is 60 to 65%. The left ventricle has normal function. The left ventricle demonstrates regional wall motion abnormalities (see scoring diagram/findings for description). Left ventricular diastolic parameters are consistent with Grade I diastolic dysfunction (impaired relaxation).  2. Right ventricular systolic function is hyperdynamic. The right ventricular size is mildly enlarged.  3. Calcified subchordal appatatus and previously describev. The mitral valve is abnormal. Trivial mitral valve regurgitation. No evidence of mitral stenosis. Moderate mitral annular calcification.  4. The aortic valve is abnormal. Aortic valve regurgitation is not visualized. Mild aortic valve stenosis.  5. The pulmonary veins are not sufficiently imaged to evalution for pulmonary vein thrombus based off of this study. FINDINGS  Left Ventricle: Left ventricular ejection fraction, by estimation, is 60 to 65%. The left ventricle has normal function. The left ventricle demonstrates regional wall motion abnormalities. The left ventricular internal cavity size was normal in size. There is no left ventricular hypertrophy. Left ventricular diastolic parameters are consistent with Grade I diastolic dysfunction (impaired relaxation).  LV Wall Scoring: The basal inferior segment is hypokinetic. Right Ventricle: The right ventricular size is mildly enlarged. No increase in right ventricular wall thickness. Right ventricular systolic function is hyperdynamic. Left Atrium: Left atrial size was normal in size. Right Atrium: Right atrial size was normal in size. Pericardium: There is no evidence of pericardial effusion. Presence of epicardial fat layer. Mitral Valve: Calcified subchordal appatatus and previously describev. The mitral valve is abnormal. Moderate mitral annular calcification. Trivial mitral valve regurgitation. No evidence of mitral valve stenosis. Tricuspid Valve:  The tricuspid valve is normal in structure. Tricuspid valve regurgitation is not demonstrated. No evidence of tricuspid stenosis. Aortic Valve: The aortic valve is abnormal. Aortic valve regurgitation is not visualized. Mild aortic stenosis is present. Aortic valve mean gradient measures 10.0 mmHg. Aortic valve peak gradient measures 21.5 mmHg. Aortic valve area, by VTI measures 1.37 cm. Pulmonic Valve: The pulmonary veins are not sufficiently imaged to evalution for pulmonary vein thrombus based off of this study. The pulmonic valve was not well visualized. Pulmonic valve regurgitation is not visualized. Aorta: The aortic root and ascending aorta are structurally normal, with no evidence of dilitation. Venous: The pulmonary veins are not sufficiently imaged to evalution for pulmonary vein thrombus based off of this study. IAS/Shunts: No atrial level shunt detected by color flow Doppler.  LEFT VENTRICLE PLAX 2D LVIDd:         4.60 cm   Diastology LVIDs:         3.60 cm   LV e' medial:    8.27 cm/s LV PW:         0.90 cm   LV E/e' medial:  11.0 LV IVS:        1.00 cm   LV e' lateral:   8.81 cm/s LVOT diam:     1.90 cm   LV E/e' lateral: 10.3 LV SV:         61 LV SV Index:   30 LVOT Area:     2.84 cm  RIGHT VENTRICLE RV Basal diam:  2.70 cm RV S prime:     13.80 cm/s TAPSE (M-mode): 3.0 cm LEFT ATRIUM  Index        RIGHT ATRIUM           Index LA diam:        3.70 cm 1.82 cm/m   RA Area:     15.30 cm LA Vol (A2C):   43.1 ml 21.16 ml/m  RA Volume:   38.30 ml  18.80 ml/m LA Vol (A4C):   51.2 ml 25.13 ml/m LA Biplane Vol: 49.5 ml 24.30 ml/m  AORTIC VALVE AV Area (Vmax):    1.16 cm AV Area (Vmean):   1.23 cm AV Area (VTI):     1.37 cm AV Vmax:           232.00 cm/s AV Vmean:          152.000 cm/s AV VTI:            0.448 m AV Peak Grad:      21.5 mmHg AV Mean Grad:      10.0 mmHg LVOT Vmax:         94.85 cm/s LVOT Vmean:        65.750 cm/s LVOT VTI:          0.216 m LVOT/AV VTI ratio: 0.48  AORTA Ao  Root diam: 3.50 cm Ao Asc diam:  3.70 cm MITRAL VALVE MV Area (PHT): 4.31 cm     SHUNTS MV Decel Time: 176 msec     Systemic VTI:  0.22 m MV E velocity: 90.70 cm/s   Systemic Diam: 1.90 cm MV A velocity: 116.00 cm/s MV E/A ratio:  0.78 Rudean Haskell MD Electronically signed by Rudean Haskell MD Signature Date/Time: 07/09/2022/1:08:59 PM    Final    CT Angio Chest Pulmonary Embolism (PE) W or WO Contrast  Addendum Date: 07/09/2022   ADDENDUM REPORT: 07/09/2022 12:35 ADDENDUM: Of note the patient also underwent a brain MRI of July 08, 2022. Interpretation is pending but preliminarily review with neuroradiology shows stigmata of multiple small infarcts particularly in the LEFT cerebral hemisphere and also likely in the LEFT basal ganglia, most suggestive of embolic phenomenon. This further supports the possibility of pulmonary venous side and LEFT atrial extension of thrombus from RIGHT inferior pulmonary vein. Suggest neurologic and cardiology consultation in this complex patient who also has stigmata of liver disease which is quite severe based on hepatic morphology. Critical Value/emergent results were called by telephone at the time of interpretation on 07/09/2022 at 12:35 pm to provider DAWOOD [facility] , who verbally acknowledged these results. Electronically Signed   By: Zetta Bills M.D.   On: 07/09/2022 12:35   Result Date: 07/09/2022 CLINICAL DATA:  Chest pain, elevated troponin. Rule out pulmonary embolism. EXAM: CT ANGIOGRAPHY CHEST WITH CONTRAST TECHNIQUE: Multidetector CT imaging of the chest was performed using the standard protocol during bolus administration of intravenous contrast. Multiplanar CT image reconstructions and MIPs were obtained to evaluate the vascular anatomy. RADIATION DOSE REDUCTION: This exam was performed according to the departmental dose-optimization program which includes automated exposure control, adjustment of the mA and/or kV according to patient size  and/or use of iterative reconstruction technique. CONTRAST:  10m OMNIPAQUE IOHEXOL 350 MG/ML SOLN COMPARISON:  Abdominal imaging from February 25, 2022 FINDINGS: Cardiovascular: Calcified and noncalcified aortic atherosclerotic plaque. No gross acute aortic process though assessment is limited by bolus timing, study performed for evaluation of pulmonary arteries. Heart size is normal without pericardial effusion or nodularity. There is three-vessel coronary artery disease. Main pulmonary artery is opacified to 4 in 16 Hounsfield units. There  is no sign of pulmonary embolism. Posterior division inferior pulmonary vein on the RIGHT and inferior pulmonary vein showing variable attenuation. There is peripheral ground-glass in there is non opacification of posterior division of the RIGHT inferior pulmonary vein seen on image 214/6. All other branches of the pulmonary venous system on the RIGHT and LEFT appear well opacified. Low-attenuation extending into the RIGHT inferior pulmonary vein and low attenuation present at the orifice of the vein in the LEFT atrium. Mediastinum/Nodes: Thoracic inlet is unremarkable. No axillary lymphadenopathy. No mediastinal or gross hilar lymphadenopathy with scattered small lymph nodes in the chest. Lungs/Pleura: Patchy ground-glass opacities some areas with distinct nodularity, for example on image 55/7 largest area measuring 7 mm discrete from a more geographic area that measures up to 5 cm in the RIGHT upper lobe. Another area of more discrete ground-glass measuring 12 x 9 mm. Patchy areas of ground-glass attenuation in posterior RIGHT lower lobe in areas subtended by the venous abnormality seen above. There is a discrete RIGHT lower lobe nodule measuring 6 mm (image 55/7) Small nodules also present at the LEFT lung base 6 mm nodule on image 90/7 and a 5 mm nodule on image 94/7. Upper Abdomen: Signs of marked cirrhotic hepatic morphology. Liver not well assessed, studies essentially a  noncontrast exam in the abdomen. Spleen is moderately enlarged and not well evaluated. Imaged portions of the pancreas, adrenal glands and kidneys are unremarkable without acute gastrointestinal process seen in the upper abdomen. Musculoskeletal: No acute bone finding. No destructive bone process. Spinal degenerative changes. Review of the MIP images confirms the above findings. IMPRESSION: 1. No sign of pulmonary embolism. 2. Posterior division inferior pulmonary vein on the RIGHT and inferior pulmonary vein showing lack of opacification. This extends to the orifice of the RIGHT inferior pulmonary vein and perhaps even into the periphery of the LEFT atrium. This is moderately suspicious for thrombus made more suspicious by the normal opacification of other pulmonary venous branches in the chest. Consider cardiology consult confirmation with venous phase assessment of the chest is suggested. 3. Multiple areas of patchy ground-glass and discrete nodularity in the RIGHT upper lobe. Findings favor an infectious or inflammatory etiology. 4. Better defined lower lobe nodules largest at 6 mm. Given the multifocal nature of findings in the chest and concern for infection but some indeterminate more discrete nodular areas with suggest 8-12 week follow-up outside of the acute setting to ensure resolution. 5. Marked hepatic cirrhosis 6. Aortic atherosclerosis and coronary artery disease. Aortic Atherosclerosis (ICD10-I70.0). Critical Value/emergent results were called by telephone at the time of interpretation on 07/09/2022 at 12:20 pm to provider DAWOOD [facility] , who verbally acknowledged these results. Electronically Signed: By: Zetta Bills M.D. On: 07/09/2022 12:20    Cardiac Studies   ECHO: 07/09/2022 Sonographer Comments: Image acquisition challenging due to respiratory  motion.  IMPRESSIONS   1. Left ventricular ejection fraction, by estimation, is 60 to 65%. The left ventricle has normal function. The left  ventricle demonstrates regional wall motion abnormalities (see scoring diagram/findings for description).  The basal inferior segment is hypokinetic.Left ventricular diastolic parameters are consistent with Grade I diastolic dysfunction (impaired relaxation).   2. Right ventricular systolic function is hyperdynamic. The right  ventricular size is mildly enlarged.   3. Calcified subchordal appatatus and previously describev. The mitral valve is abnormal. Trivial mitral valve regurgitation. No evidence of mitral stenosis. Moderate mitral annular calcification.   4. The aortic valve is abnormal. Aortic valve regurgitation is not  visualized. Mild aortic valve stenosis.   5. The pulmonary veins are not sufficiently imaged to evalution for pulmonary vein thrombus based off of this study.   CTA Chest 07/09/22: IMPRESSION: 1. No sign of pulmonary embolism. 2. Posterior division inferior pulmonary vein on the RIGHT and inferior pulmonary vein showing lack of opacification. This extends to the orifice of the RIGHT inferior pulmonary vein and perhaps even into the periphery of the LEFT atrium. This is moderately suspicious for thrombus made more suspicious by the normal opacification of other pulmonary venous branches in the chest. Consider cardiology consult confirmation with venous phase assessment of the chest is suggested. 3. Multiple areas of patchy ground-glass and discrete nodularity in the RIGHT upper lobe. Findings favor an infectious or inflammatory etiology. 4. Better defined lower lobe nodules largest at 6 mm. Given the multifocal nature of findings in the chest and concern for infection but some indeterminate more discrete nodular areas with suggest 8-12 week follow-up outside of the acute setting to ensure resolution. 5. Marked hepatic cirrhosis 6. Aortic atherosclerosis and coronary artery disease.  MRI Brain 07/10/22: FINDINGS: POSTERIOR CIRCULATION:   --Vertebral arteries:  Normal   --Inferior cerebellar arteries: Normal.   --Basilar artery: Normal.   --Superior cerebellar arteries: Normal.   --Posterior cerebral arteries: Unchanged appearance of the diminutive right PCA   ANTERIOR CIRCULATION:   --Intracranial internal carotid arteries: Normal.   --Anterior cerebral arteries (ACA): Unchanged posteriorly projecting focus from the anterior communicating artery measuring 1 x 2 mm.   --Middle cerebral arteries (MCA): Normal.   IMPRESSION: 1. No emergent large vessel occlusion or high-grade stenosis. 2. Unchanged appearance of the diminutive right PCA. 3. 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    Patient Profile     77 y.o. male with a hx of CVA, DM2, hypertension, hyperlipidemia, hep C, and CKD IIIA who presented to the ER with severe sepsis from CAP with course complicated by chest pain and elevated troponin for which Cardiology has been consulted.   Assessment & Plan    #NSTEMI: Patient presented with chest pressure in the setting of community acquired PNA. Trop [phone]. ECG with no acute ischemic changes. TTE with normal BiV function and no significant WMA. CTA chest notable for significant coronary Ca. Patient is currently chest pain free and comfortable. Overall, suspect he has demand ischemia in the setting of sepsis however given the degree of trop rise, significant coronary Ca on CTA, and risk factors, will plan for ischemic evaluation. Given that he will need a repeat CT chest for evaluation of possible pulmonary vein thrombus, will plan for CTA coronaries at that time to try to consolidate testing/contrast loads.  -Plan for coronary CTA today -Continue heparin gtt pending CTA findings -Continue ASA 54m daily, plavix 766mdaily -Continue lipitor 40102maily -Continue losartan 100m17mily -Continue metop 25mg62m; will change to long-acting prior to discharge  #Community Acquired PNA: Patient presented with fevers,  rigors, chest discomfort and SOB found to have community acquired PNA. Now on ABX per primary. -Continue CTX  #Possible Right Inferior Pulmonary Vein Thrombus: CTA with lack of opacification of the right inferior pulmonary vein while other pulmonary veins opacified normally raising concern for thrombus. While this would be very unusual in the absence of prior intervention on the pulmonary veins/chest surgery, given that the patient has had stigmata of multiple small infarcts on MRI brain concerning for cardioembolic source, will plan for CCTA to assess further. He is currently on heparin gtt  for [facility]. -Can plan for venous study at the time of coronary CTA  -Continue heparin gtt -If true pulmonary vein thrombus, this would be highly suggestive of a hypercoaguable state and would merit further work-up  #CVA: MRI brain with small recurrent acute or subacute infarcts raising concern for cardioembolic etiology. Stroke team following. -Continue ASA, plavix and lipitor as above -Management per Neuro -Will need 30day monitor at discharge if telemetry unrevealing  #HTN: -Continue metop 5m BID; change to long-acting vs coreg prior to discharge -Continue losartan 1051mdaily  #Mild AS: -Continue serial monitoring as outpatient  #HLD: -Continue lipitor as above  #DMII: -Management per primary       For questions or updates, please contact [facility] [facility] consult [email or web address] for contact info under        Signed, HeFreada BergeronMD  07/10/2022, 7:31 PM

## 2022-07-10 NOTE — Progress Notes (Signed)
ANTICOAGULATION CONSULT NOTE Pharmacy Consult for heparin Indication: pulmonary embolus Brief A/P: Heparin level subtherapeutic Increase Heparin rate   No Known Allergies  Patient Measurements: Height: 6' (182.9 cm) Weight: 83.6 kg (184 lb 3.2 oz) IBW/kg (Calculated) : 77.6 Heparin Dosing Weight: TBW  Vital Signs: Temp: 97.9 F (36.6 C) (02/24 1953) Temp Source: Oral (02/24 1953) BP: 148/82 (02/24 2110) Pulse Rate: 95 (02/24 1953)  Labs: Recent Labs    07/08/22 1811 07/08/22 2025 07/09/22 0456 07/09/22 2146  HGB 13.3  --  12.7*  --   HCT 38.7*  --  36.8*  --   PLT 121*  --  99*  --   HEPARINUNFRC  --   --   --  0.22*  CREATININE 1.52*  --  1.33*  --   TROPONINIHS 49* 675* 1,286*  --      Estimated Creatinine Clearance: 51.1 mL/min (A) (by C-G formula based on SCr of 1.33 mg/dL (H)).  Assessment: 77 yo male admitted with CVA, found to have pulmonary vein thrombus for heparin Goal of Therapy:  Heparin level 0.3-0.5 units/ml Monitor platelets by anticoagulation protocol: Yes   Plan:  Increase Heparin 1350 units/hr Check heparin level in 8 hours.   Phillis Knack, PharmD, BCPS   07/10/2022 12:25 AM  -

## 2022-07-10 NOTE — Progress Notes (Signed)
Rounding Note    Patient Name: Collin Bridges Date of Encounter: 07/10/2022  [place] Cardiologist: None   Subjective   Feeling okay this morning. No chest pain or SOB  Inpatient Medications    Scheduled Meds:  aspirin EC  81 mg Oral Daily   atorvastatin  40 mg Oral Daily   fenofibrate  160 mg Oral Daily   fluticasone  1 spray Each Nare Daily   gabapentin  300 mg Oral QHS   insulin aspart  0-6 Units Subcutaneous TID WC   losartan  100 mg Oral Daily   metoprolol tartrate  25 mg Oral BID   PARoxetine  40 mg Oral Daily   pneumococcal 20-valent conjugate vaccine  0.5 mL Intramuscular Tomorrow-1000   QUEtiapine  50 mg Oral BID   Continuous Infusions:  azithromycin Stopped (07/09/22 2303)   cefTRIAXone (ROCEPHIN)  IV Stopped (07/09/22 2159)   heparin 1,350 Units/hr (07/10/22 0800)   PRN Meds: acetaminophen **OR** acetaminophen, melatonin, ondansetron (ZOFRAN) IV   Vital Signs    Vitals:   07/09/22 2110 07/10/22 0046 07/10/22 0325 07/10/22 0452  BP: (!) 148/82 133/89 139/81   Pulse:  85    Resp:  18    Temp:  97.9 F (36.6 C) 98 F (36.7 C)   TempSrc:  Oral Oral   SpO2:  96% 94%   Weight:    83.4 kg  Height:        Intake/Output Summary (Last 24 hours) at 07/10/2022 0808 Last data filed at 07/10/2022 [id number] Gross per 24 hour  Intake 1179.52 ml  Output 1175 ml  Net 4.52 ml      07/10/2022    4:52 AM 07/09/2022    5:46 PM 07/08/2022    6:02 PM  Last 3 Weights  Weight (lbs) 183 lb 14.4 oz 184 lb 3.2 oz 180 lb  Weight (kg) 83.416 kg 83.553 kg 81.647 kg      Telemetry    NSR - Personally Reviewed  ECG    No new tracing today - Personally Reviewed  Physical Exam   GEN: No acute distress.   Neck: No JVD Cardiac: RRR, 2/6 harsh, early peaking systolic murmur Respiratory: Clear to auscultation bilaterally. GI: Soft, nontender, non-distended  MS: Trace edema, warm Neuro:  Nonfocal  Psych: Normal affect   Labs    High Sensitivity  Troponin:   Recent Labs  Lab 07/08/22 1811 07/08/22 2025 07/09/22 0456  TROPONINIHS 49* 675* 1,286*     Chemistry Recent Labs  Lab 07/08/22 1811 07/08/22 2025 07/09/22 0456  NA 135  --  140  K 3.9  --  3.6  CL 102  --  107  CO2 22  --  25  GLUCOSE 176*  --  138*  BUN 18  --  16  CREATININE 1.52*  --  1.33*  CALCIUM 9.4  --  8.8*  MG  --  2.1 2.1  PROT 6.5  --  5.8*  ALBUMIN 3.7  --  3.3*  AST 41  --  34  ALT 22  --  20  ALKPHOS 81  --  72  BILITOT 0.8  --  0.7  GFRNONAA 47*  --  55*  ANIONGAP 11  --  8    Lipids No results for input(s): "CHOL", "TRIG", "HDL", "LABVLDL", "LDLCALC", "CHOLHDL" in the last 168 hours.  Hematology Recent Labs  Lab 07/08/22 1811 07/09/22 0456  WBC 16.6* 10.8*  RBC 4.20* 3.95*  HGB 13.3 12.7*  HCT 38.7* 36.8*  MCV 92.1 93.2  MCH 31.7 32.2  MCHC 34.4 34.5  RDW 12.8 12.9  PLT 121* 99*   Thyroid No results for input(s): "TSH", "FREET4" in the last 168 hours.  BNPNo results for input(s): "BNP", "PROBNP" in the last 168 hours.  DDimer  Recent Labs  Lab 07/09/22 2146  DDIMER 0.91*     Radiology    MR ANGIO HEAD WO CONTRAST  Result Date: 07/10/2022 CLINICAL DATA:  Stroke follow-up EXAM: MRA HEAD WITHOUT CONTRAST TECHNIQUE: Angiographic images of the Circle of Willis were acquired using MRA technique without intravenous contrast. COMPARISON:  None Available. FINDINGS: POSTERIOR CIRCULATION: --Vertebral arteries: Normal --Inferior cerebellar arteries: Normal. --Basilar artery: Normal. --Superior cerebellar arteries: Normal. --Posterior cerebral arteries: Unchanged appearance of the diminutive right PCA ANTERIOR CIRCULATION: --Intracranial internal carotid arteries: Normal. --Anterior cerebral arteries (ACA): Unchanged posteriorly projecting focus from the anterior communicating artery measuring 1 x 2 mm. --Middle cerebral arteries (MCA): Normal. IMPRESSION: 1. No emergent large vessel occlusion or high-grade stenosis. 2. Unchanged  appearance of the diminutive right PCA. 3. 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 Electronically Signed   By: Ulyses Jarred M.D.   On: 07/10/2022 03:42   VAS [place] LOWER EXTREMITY VENOUS (DVT)  Result Date: 07/09/2022  Lower Venous DVT Study Patient Name:  Collin Bridges  Date of Exam:   07/09/2022 Medical Rec #: [id number]              Accession #:    [id number] Date of Birth: [birth year]             Patient Gender: M Patient Age:   43 years Exam Location:  [facility] Procedure:      VAS [place] LOWER EXTREMITY VENOUS (DVT) Referring Phys: DAWOOD ELGERGAWY --------------------------------------------------------------------------------  Indications: Swelling.  Comparison Study: No prior study on file Performing Technologist: Sharion Dove RVS  Examination Guidelines: A complete evaluation includes B-mode imaging, spectral Doppler, color Doppler, and power Doppler as needed of all accessible portions of each vessel. Bilateral testing is considered an integral part of a complete examination. Limited examinations for reoccurring indications may be performed as noted. The reflux portion of the exam is performed with the patient in reverse Trendelenburg.  +---------+---------------+---------+-----------+----------+--------------+ RIGHT    CompressibilityPhasicitySpontaneityPropertiesThrombus Aging +---------+---------------+---------+-----------+----------+--------------+ CFV      Full           Yes      Yes                                 +---------+---------------+---------+-----------+----------+--------------+ SFJ      Full                                                        +---------+---------------+---------+-----------+----------+--------------+ FV Prox  Full                                                        +---------+---------------+---------+-----------+----------+--------------+ FV Mid   Full                                                         +---------+---------------+---------+-----------+----------+--------------+  FV DistalFull                                                        +---------+---------------+---------+-----------+----------+--------------+ PFV      Full                                                        +---------+---------------+---------+-----------+----------+--------------+ POP      Full           Yes      Yes                                 +---------+---------------+---------+-----------+----------+--------------+ PTV      Full                                                        +---------+---------------+---------+-----------+----------+--------------+ PERO     Full                                                        +---------+---------------+---------+-----------+----------+--------------+   +---------+---------------+---------+-----------+----------+--------------+ LEFT     CompressibilityPhasicitySpontaneityPropertiesThrombus Aging +---------+---------------+---------+-----------+----------+--------------+ CFV      Full           Yes      Yes                                 +---------+---------------+---------+-----------+----------+--------------+ SFJ      Full                                                        +---------+---------------+---------+-----------+----------+--------------+ FV Prox  Full                                                        +---------+---------------+---------+-----------+----------+--------------+ FV Mid   Full                                                        +---------+---------------+---------+-----------+----------+--------------+ FV DistalFull                                                        +---------+---------------+---------+-----------+----------+--------------+   PFV      Full                                                         +---------+---------------+---------+-----------+----------+--------------+ POP      Full           Yes      Yes                                 +---------+---------------+---------+-----------+----------+--------------+ PTV      Full                                                        +---------+---------------+---------+-----------+----------+--------------+ PERO     Full                                                        +---------+---------------+---------+-----------+----------+--------------+     Summary: BILATERAL: - No evidence of deep vein thrombosis seen in the lower extremities, bilaterally. -No evidence of popliteal cyst, bilaterally.   *See table(s) above for measurements and observations.    Preliminary    ECHOCARDIOGRAM COMPLETE  Result Date: 07/09/2022    ECHOCARDIOGRAM REPORT   Patient Name:   DAEMION SWIMMER Date of Exam: 07/09/2022 Medical Rec #:  [id number]             Height:       72.0 in Accession #:    [id number]            Weight:       180.0 lb Date of Birth:  12-13-1945            BSA:          2.037 m Patient Age:    7 years              BP:           161/85 mmHg Patient Gender: M                     HR:           89 bpm. Exam Location:  Inpatient Procedure: 2D Echo Indications:    elevated troponin  History:        Patient has prior history of Echocardiogram examinations, most                 recent 06/16/2021. CHF, chronic kidney disease. Sepsis.; Risk                 Factors:Diabetes, Hypertension and Dyslipidemia.  Sonographer:    Johny Chess RDCS Referring Phys: [id number] Rhetta Mura  Sonographer Comments: Image acquisition challenging due to respiratory motion. IMPRESSIONS  1. Left ventricular ejection fraction, by estimation, is 60 to 65%. The left ventricle has normal function. The left ventricle demonstrates regional wall motion abnormalities (see scoring diagram/findings for description). Left ventricular diastolic parameters are  consistent with Grade I  diastolic dysfunction (impaired relaxation).  2. Right ventricular systolic function is hyperdynamic. The right ventricular size is mildly enlarged.  3. Calcified subchordal appatatus and previously describev. The mitral valve is abnormal. Trivial mitral valve regurgitation. No evidence of mitral stenosis. Moderate mitral annular calcification.  4. The aortic valve is abnormal. Aortic valve regurgitation is not visualized. Mild aortic valve stenosis.  5. The pulmonary veins are not sufficiently imaged to evalution for pulmonary vein thrombus based off of this study. FINDINGS  Left Ventricle: Left ventricular ejection fraction, by estimation, is 60 to 65%. The left ventricle has normal function. The left ventricle demonstrates regional wall motion abnormalities. The left ventricular internal cavity size was normal in size. There is no left ventricular hypertrophy. Left ventricular diastolic parameters are consistent with Grade I diastolic dysfunction (impaired relaxation).  LV Wall Scoring: The basal inferior segment is hypokinetic. Right Ventricle: The right ventricular size is mildly enlarged. No increase in right ventricular wall thickness. Right ventricular systolic function is hyperdynamic. Left Atrium: Left atrial size was normal in size. Right Atrium: Right atrial size was normal in size. Pericardium: There is no evidence of pericardial effusion. Presence of epicardial fat layer. Mitral Valve: Calcified subchordal appatatus and previously describev. The mitral valve is abnormal. Moderate mitral annular calcification. Trivial mitral valve regurgitation. No evidence of mitral valve stenosis. Tricuspid Valve: The tricuspid valve is normal in structure. Tricuspid valve regurgitation is not demonstrated. No evidence of tricuspid stenosis. Aortic Valve: The aortic valve is abnormal. Aortic valve regurgitation is not visualized. Mild aortic stenosis is present. Aortic valve mean gradient  measures 10.0 mmHg. Aortic valve peak gradient measures 21.5 mmHg. Aortic valve area, by VTI measures 1.37 cm. Pulmonic Valve: The pulmonary veins are not sufficiently imaged to evalution for pulmonary vein thrombus based off of this study. The pulmonic valve was not well visualized. Pulmonic valve regurgitation is not visualized. Aorta: The aortic root and ascending aorta are structurally normal, with no evidence of dilitation. Venous: The pulmonary veins are not sufficiently imaged to evalution for pulmonary vein thrombus based off of this study. IAS/Shunts: No atrial level shunt detected by color flow Doppler.  LEFT VENTRICLE PLAX 2D LVIDd:         4.60 cm   Diastology LVIDs:         3.60 cm   LV e' medial:    8.27 cm/s LV PW:         0.90 cm   LV E/e' medial:  11.0 LV IVS:        1.00 cm   LV e' lateral:   8.81 cm/s LVOT diam:     1.90 cm   LV E/e' lateral: 10.3 LV SV:         61 LV SV Index:   30 LVOT Area:     2.84 cm  RIGHT VENTRICLE RV Basal diam:  2.70 cm RV S prime:     13.80 cm/s TAPSE (M-mode): 3.0 cm LEFT ATRIUM             Index        RIGHT ATRIUM           Index LA diam:        3.70 cm 1.82 cm/m   RA Area:     15.30 cm LA Vol (A2C):   43.1 ml 21.16 ml/m  RA Volume:   38.30 ml  18.80 ml/m LA Vol (A4C):   51.2 ml 25.13 ml/m LA Biplane Vol: 49.5 ml 24.30 ml/m  AORTIC VALVE AV Area (Vmax):    1.16 cm AV Area (Vmean):   1.23 cm AV Area (VTI):     1.37 cm AV Vmax:           232.00 cm/s AV Vmean:          152.000 cm/s AV VTI:            0.448 m AV Peak Grad:      21.5 mmHg AV Mean Grad:      10.0 mmHg LVOT Vmax:         94.85 cm/s LVOT Vmean:        65.750 cm/s LVOT VTI:          0.216 m LVOT/AV VTI ratio: 0.48  AORTA Ao Root diam: 3.50 cm Ao Asc diam:  3.70 cm MITRAL VALVE MV Area (PHT): 4.31 cm     SHUNTS MV Decel Time: 176 msec     Systemic VTI:  0.22 m MV E velocity: 90.70 cm/s   Systemic Diam: 1.90 cm MV A velocity: 116.00 cm/s MV E/A ratio:  0.78 Rudean Haskell MD Electronically  signed by Rudean Haskell MD Signature Date/Time: 07/09/2022/1:08:59 PM    Final    MR BRAIN W WO CONTRAST  Result Date: 07/09/2022 CLINICAL DATA:  Study identified as missing a report during evaluation of emergent CTA chest at 12:27 pm on 07/09/2022. 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 EXAM: MRI HEAD WITHOUT AND WITH CONTRAST TECHNIQUE: Multiplanar, multiecho pulse sequences of the brain and surrounding structures were obtained without and with intravenous contrast. CONTRAST:  7.5 mL Vueway COMPARISON:  Noncontrast brain MRI 04/03/2021. FINDINGS: Brain: There are multiple small foci of abnormal diffusion scattered in the bilateral cerebral hemispheres, and a left lentiform focus (series 3, image 76 and left periventricular white matter focus (image 82) appear to be restricted. Of note, there were similar scattered small hemispheric infarcts suspected on the previous exam in 2022. No associated hemorrhage or mass effect. Chronically advanced cerebral white matter disease with confluent T2 and FLAIR hyperintensity. Some small areas of chronic white matter cystic encephalomalacia. Chronic bilateral deep gray matter nuclei lacunar infarcts also, progressed since 2022. No definite chronic cerebral blood products. Small if any areas of chronic cortical encephalomalacia. Small homogeneously enhancing left tentorium meningioma is 19 by 15 x 7 mm (AP by transverse by CC) and does not appear significantly changed since 2022. no associated edema or significant mass effect. No other abnormal intracranial enhancement or dural thickening identified. Vascular: Major intracranial vascular flow voids are stable since 2022. Following contrast the major dural venous sinuses are enhancing and appear to be patent. Skull and upper cervical spine: Visualized bone marrow signal is within normal limits. Negative visible cervical spine. Sinuses/Orbits: Stable, negative. Other: Mastoids  remain clear. Visible internal auditory structures appear normal. IMPRESSION: 1. Evidence of small recurrent acute or subacute infarcts scattered in both hemispheres, similar to that demonstrated in 2022. No associated hemorrhage or mass effect. See also emergent Chest CTA report 07/09/2022. 2. Underlying progression of chronic small vessel ischemia since 2022. 3. 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 Suspect this is clinically silent. Electronically Signed   By: Genevie Ann M.D.   On: 07/09/2022 12:38   CT Angio Chest Pulmonary Embolism (PE) W or WO Contrast  Addendum Date: 07/09/2022   ADDENDUM REPORT: 07/09/2022 12:35 ADDENDUM: Of note the patient also underwent a brain MRI of July 08, 2022. Interpretation is pending but  preliminarily review with neuroradiology shows stigmata of multiple small infarcts particularly in the LEFT cerebral hemisphere and also likely in the LEFT basal ganglia, most suggestive of embolic phenomenon. This further supports the possibility of pulmonary venous side and LEFT atrial extension of thrombus from RIGHT inferior pulmonary vein. Suggest neurologic and cardiology consultation in this complex patient who also has stigmata of liver disease which is quite severe based on hepatic morphology. Critical Value/emergent results were called by telephone at the time of interpretation on 07/09/2022 at 12:35 pm to provider DAWOOD [facility] , who verbally acknowledged these results. Electronically Signed   By: Zetta Bills M.D.   On: 07/09/2022 12:35   Result Date: 07/09/2022 CLINICAL DATA:  Chest pain, elevated troponin. Rule out pulmonary embolism. EXAM: CT ANGIOGRAPHY CHEST WITH CONTRAST TECHNIQUE: Multidetector CT imaging of the chest was performed using the standard protocol during bolus administration of intravenous contrast. Multiplanar CT image reconstructions and MIPs were obtained to evaluate the vascular anatomy. RADIATION DOSE REDUCTION: This exam was performed  according to the departmental dose-optimization program which includes automated exposure control, adjustment of the mA and/or kV according to patient size and/or use of iterative reconstruction technique. CONTRAST:  4m OMNIPAQUE IOHEXOL 350 MG/ML SOLN COMPARISON:  Abdominal imaging from February 25, 2022 FINDINGS: Cardiovascular: Calcified and noncalcified aortic atherosclerotic plaque. No gross acute aortic process though assessment is limited by bolus timing, study performed for evaluation of pulmonary arteries. Heart size is normal without pericardial effusion or nodularity. There is three-vessel coronary artery disease. Main pulmonary artery is opacified to 4 in 16 Hounsfield units. There is no sign of pulmonary embolism. Posterior division inferior pulmonary vein on the RIGHT and inferior pulmonary vein showing variable attenuation. There is peripheral ground-glass in there is non opacification of posterior division of the RIGHT inferior pulmonary vein seen on image 214/6. All other branches of the pulmonary venous system on the RIGHT and LEFT appear well opacified. Low-attenuation extending into the RIGHT inferior pulmonary vein and low attenuation present at the orifice of the vein in the LEFT atrium. Mediastinum/Nodes: Thoracic inlet is unremarkable. No axillary lymphadenopathy. No mediastinal or gross hilar lymphadenopathy with scattered small lymph nodes in the chest. Lungs/Pleura: Patchy ground-glass opacities some areas with distinct nodularity, for example on image 55/7 largest area measuring 7 mm discrete from a more geographic area that measures up to 5 cm in the RIGHT upper lobe. Another area of more discrete ground-glass measuring 12 x 9 mm. Patchy areas of ground-glass attenuation in posterior RIGHT lower lobe in areas subtended by the venous abnormality seen above. There is a discrete RIGHT lower lobe nodule measuring 6 mm (image 55/7) Small nodules also present at the LEFT lung base 6 mm nodule  on image 90/7 and a 5 mm nodule on image 94/7. Upper Abdomen: Signs of marked cirrhotic hepatic morphology. Liver not well assessed, studies essentially a noncontrast exam in the abdomen. Spleen is moderately enlarged and not well evaluated. Imaged portions of the pancreas, adrenal glands and kidneys are unremarkable without acute gastrointestinal process seen in the upper abdomen. Musculoskeletal: No acute bone finding. No destructive bone process. Spinal degenerative changes. Review of the MIP images confirms the above findings. IMPRESSION: 1. No sign of pulmonary embolism. 2. Posterior division inferior pulmonary vein on the RIGHT and inferior pulmonary vein showing lack of opacification. This extends to the orifice of the RIGHT inferior pulmonary vein and perhaps even into the periphery of the LEFT atrium. This is moderately suspicious for thrombus made  more suspicious by the normal opacification of other pulmonary venous branches in the chest. Consider cardiology consult confirmation with venous phase assessment of the chest is suggested. 3. Multiple areas of patchy ground-glass and discrete nodularity in the RIGHT upper lobe. Findings favor an infectious or inflammatory etiology. 4. Better defined lower lobe nodules largest at 6 mm. Given the multifocal nature of findings in the chest and concern for infection but some indeterminate more discrete nodular areas with suggest 8-12 week follow-up outside of the acute setting to ensure resolution. 5. Marked hepatic cirrhosis 6. Aortic atherosclerosis and coronary artery disease. Aortic Atherosclerosis (ICD10-I70.0). Critical Value/emergent results were called by telephone at the time of interpretation on 07/09/2022 at 12:20 pm to provider DAWOOD [facility] , who verbally acknowledged these results. Electronically Signed: By: Zetta Bills M.D. On: 07/09/2022 12:20   DG Chest 2 View  Result Date: 07/08/2022 CLINICAL DATA:  Chest pain.  Fever.  Flu symptoms. EXAM:  CHEST - 2 VIEW COMPARISON:  03/10/2021 FINDINGS: The heart size and mediastinal contours are within normal limits. Elevation of right hemidiaphragm again noted. Mild asymmetric patchy opacity is seen in the right upper lobe which appears new, suspicious for pneumonia. Evidence of pleural effusion. IMPRESSION: Mild asymmetric patchy opacity in right upper lobe, suspicious for pneumonia. Recommend continued chest radiographic follow-up to confirm resolution. Electronically Signed   By: Marlaine Hind M.D.   On: 07/08/2022 18:48    Cardiac Studies   ECHO: 07/09/2022 Sonographer Comments: Image acquisition challenging due to respiratory  motion.  IMPRESSIONS   1. Left ventricular ejection fraction, by estimation, is 60 to 65%. The left ventricle has normal function. The left ventricle demonstrates regional wall motion abnormalities (see scoring diagram/findings for description).  The basal inferior segment is hypokinetic.Left ventricular diastolic parameters are consistent with Grade I diastolic dysfunction (impaired relaxation).   2. Right ventricular systolic function is hyperdynamic. The right  ventricular size is mildly enlarged.   3. Calcified subchordal appatatus and previously describev. The mitral valve is abnormal. Trivial mitral valve regurgitation. No evidence of mitral stenosis. Moderate mitral annular calcification.   4. The aortic valve is abnormal. Aortic valve regurgitation is not  visualized. Mild aortic valve stenosis.   5. The pulmonary veins are not sufficiently imaged to evalution for pulmonary vein thrombus based off of this study.   CTA Chest 07/09/22: IMPRESSION: 1. No sign of pulmonary embolism. 2. Posterior division inferior pulmonary vein on the RIGHT and inferior pulmonary vein showing lack of opacification. This extends to the orifice of the RIGHT inferior pulmonary vein and perhaps even into the periphery of the LEFT atrium. This is moderately suspicious for thrombus  made more suspicious by the normal opacification of other pulmonary venous branches in the chest. Consider cardiology consult confirmation with venous phase assessment of the chest is suggested. 3. Multiple areas of patchy ground-glass and discrete nodularity in the RIGHT upper lobe. Findings favor an infectious or inflammatory etiology. 4. Better defined lower lobe nodules largest at 6 mm. Given the multifocal nature of findings in the chest and concern for infection but some indeterminate more discrete nodular areas with suggest 8-12 week follow-up outside of the acute setting to ensure resolution. 5. Marked hepatic cirrhosis 6. Aortic atherosclerosis and coronary artery disease.  MRI Brain 07/10/22: FINDINGS: POSTERIOR CIRCULATION:   --Vertebral arteries: Normal   --Inferior cerebellar arteries: Normal.   --Basilar artery: Normal.   --Superior cerebellar arteries: Normal.   --Posterior cerebral arteries: Unchanged appearance of the diminutive right PCA  ANTERIOR CIRCULATION:   --Intracranial internal carotid arteries: Normal.   --Anterior cerebral arteries (ACA): Unchanged posteriorly projecting focus from the anterior communicating artery measuring 1 x 2 mm.   --Middle cerebral arteries (MCA): Normal.   IMPRESSION: 1. No emergent large vessel occlusion or high-grade stenosis. 2. Unchanged appearance of the diminutive right PCA. 3. 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    Patient Profile     77 y.o. male with a hx of CVA, DM2, hypertension, hyperlipidemia, hep C, and CKD IIIA who presented to the ER with severe sepsis from CAP with course complicated by chest pain and elevated troponin for which Cardiology has been consulted.   Assessment & Plan    #NSTEMI: Patient presented with chest pressure in the setting of community acquired PNA. Trop [phone]. ECG with no acute ischemic changes. TTE with normal BiV function and no significant WMA.  CTA chest notable for significant coronary Ca. Patient is currently chest pain free and comfortable. Overall, suspect he has demand ischemia in the setting of sepsis however given the degree of trop rise, significant coronary Ca on CTA, and risk factors, he does merit ischemic evaluation. Given that he will need a repeat CT chest for evaluation of possible pulmonary vein thrombus, will plan for CTA coronaries at that time to try to consolidate testing/contrast loads.  -Continue heparin gtt  -Continue ASA 41m daily, plavix 748mdaily -Continue lipitor 4037maily -Continue losartan 100m29mily -Continue metop 25mg55m; will change to long-acting prior to discharge  #Community Acquired PNA: Patient presented with fevers, rigors, chest discomfort and SOB found to have community acquired PNA. Now on ABX per primary. -Continue CTX  #Possible Right Inferior Pulmonary Vein Thrombus: CTA with lack of opacification of the right inferior pulmonary vein while other pulmonary veins opacified normally raising concern for thrombus. While this would be very unusual in the absence of prior intervention on the pulmonary veins/chest surgery, given that the patient has had stigmata of multiple small infarcts on MRI brain concerning for cardioembolic source, he will need additional imaging to evaluate further. He is currently on heparin gtt for AC. -[facility] at the time of coronary CTA VS if stroke team would like TEE for further evaluation; can interrogate pulmonary veins at that time although would not use this as primary modality as often these are difficult to image -Continue heparin gtt -If true pulmonary vein thrombus, this would be highly suggestive of a hypercoaguable state and would merit further work-up  #CVA: MRI brain with small recurrent acute or subacute infarcts raising concern for cardioembolic etiology. Stroke team following. -Continue ASA, plavix and lipitor as above -Management  per Neuro -Will need 30day monitor at discharge if telemetry unrevealing  #HTN: -Increase metop 50mg 63m-Continue losartan 100mg d59m  #Mild AS: -Continue serial monitoring as outpatient  #HLD: -Continue lipitor as above  #DMII: -Management per primary       For questions or updates, please contact [facility] consult [email or web address] for contact info under        Signed, HeatherFreada Bergeron/25/2024, 8:08 AM

## 2022-07-10 NOTE — Progress Notes (Addendum)
ANTICOAGULATION CONSULT NOTE Pharmacy Consult for heparin Indication: CVA/inferior pulmonary vein thrombus   No Known Allergies  Patient Measurements: Height: 6' (182.9 cm) Weight: 83.4 kg (183 lb 14.4 oz) IBW/kg (Calculated) : 77.6 HEPARIN DW (KG): 83.6   Vital Signs: Temp: 97.7 F (36.5 C) (02/25 0807) Temp Source: Oral (02/25 0807) BP: 163/79 (02/25 0807) Pulse Rate: 89 (02/25 0926)  Labs: Recent Labs    07/08/22 1811 07/08/22 2025 07/09/22 0456 07/09/22 2146 07/10/22 0742  HGB 13.3  --  12.7*  --  13.3  HCT 38.7*  --  36.8*  --  38.3*  PLT 121*  --  99*  --  110*  HEPARINUNFRC  --   --   --  0.22* 0.50  CREATININE 1.52*  --  1.33*  --  1.33*  TROPONINIHS 49* 675* 1,286*  --   --      Estimated Creatinine Clearance: 51.1 mL/min (A) (by C-G formula based on SCr of 1.33 mg/dL (H)).  Assessment: 76 yo male admitted with CVA, found to have pulmonary vein thrombus on CTA. No AC PTA. MRI shows scattered bilateral stroke with cardioembolic etiology. Pharmacy consulted to dose heparin.   Heparin level 0.5 on drip rate 1350 units/hr therapeutic. Borderline supratherapeutic. Hgb 13.3 and plt 110, stable. No s/sx bleeding noted in chart. Will slightly decrease heparin rate preemptively to prevent supratherapeutic heparin level.  Goal of Therapy:  Heparin level 0.3-0.5 units/ml Monitor platelets by anticoagulation protocol: Yes   Plan:  Decrease heparin infusion to 1300 units/hr  Monitor daily heparin level and CBC Continue to monitor H&H     Gena Fray, PharmD PGY1 Pharmacy Resident   07/10/2022 10:17 AM

## 2022-07-10 NOTE — Progress Notes (Signed)
Notified Dr. [facility] that patient blew his nose and had some blood come out, minimal and states that he has nose bleeds sometimes. Patient is on heparin drip. MD acknowledged and stated he will order some saline nasal spray.

## 2022-07-11 ENCOUNTER — Inpatient Hospital Stay (HOSPITAL_COMMUNITY): Payer: 59

## 2022-07-11 ENCOUNTER — Encounter (HOSPITAL_COMMUNITY): Payer: 59

## 2022-07-11 ENCOUNTER — Inpatient Hospital Stay (HOSPITAL_COMMUNITY)
Admit: 2022-07-11 | Discharge: 2022-07-11 | Disposition: A | Discharge status: Home / Self Care | Payer: 59 | Attending: Cardiology | Admitting: Cardiology

## 2022-07-11 ENCOUNTER — Other Ambulatory Visit: Payer: Self-pay | Admitting: Cardiology

## 2022-07-11 DIAGNOSIS — J189 Pneumonia, unspecified organism: Secondary | ICD-10-CM | POA: Diagnosis not present

## 2022-07-11 DIAGNOSIS — I6523 Occlusion and stenosis of bilateral carotid arteries: Secondary | ICD-10-CM

## 2022-07-11 DIAGNOSIS — R931 Abnormal findings on diagnostic imaging of heart and coronary circulation: Secondary | ICD-10-CM

## 2022-07-11 DIAGNOSIS — I2510 Atherosclerotic heart disease of native coronary artery without angina pectoris: Secondary | ICD-10-CM

## 2022-07-11 DIAGNOSIS — I214 Non-ST elevation (NSTEMI) myocardial infarction: Secondary | ICD-10-CM | POA: Diagnosis not present

## 2022-07-11 DIAGNOSIS — I639 Cerebral infarction, unspecified: Secondary | ICD-10-CM | POA: Diagnosis not present

## 2022-07-11 DIAGNOSIS — I25118 Atherosclerotic heart disease of native coronary artery with other forms of angina pectoris: Secondary | ICD-10-CM | POA: Diagnosis not present

## 2022-07-11 LAB — CBC
HCT: 36.5 % — ABNORMAL LOW (ref 39.0–52.0)
Hemoglobin: 12.6 g/dL — ABNORMAL LOW (ref 13.0–17.0)
MCH: 31.7 pg (ref 26.0–34.0)
MCHC: 34.5 g/dL (ref 30.0–36.0)
MCV: 91.9 fL (ref 80.0–100.0)
Platelets: 100 10*3/uL — ABNORMAL LOW (ref 150–400)
RBC: 3.97 MIL/uL — ABNORMAL LOW (ref 4.22–5.81)
RDW: 12.6 % (ref 11.5–15.5)
WBC: 7.5 10*3/uL (ref 4.0–10.5)
nRBC: 0 % (ref 0.0–0.2)

## 2022-07-11 LAB — HEMOGLOBIN A1C
Hgb A1c MFr Bld: 6.3 % — ABNORMAL HIGH (ref 4.8–5.6)
Mean Plasma Glucose: 134 mg/dL

## 2022-07-11 LAB — GLUCOSE, CAPILLARY
Glucose-Capillary: 112 mg/dL — ABNORMAL HIGH (ref 70–99)
Glucose-Capillary: 112 mg/dL — ABNORMAL HIGH (ref 70–99)
Glucose-Capillary: 101 mg/dL — ABNORMAL HIGH (ref 70–99)
Glucose-Capillary: 138 mg/dL — ABNORMAL HIGH (ref 70–99)

## 2022-07-11 LAB — BASIC METABOLIC PANEL
Anion gap: 11 (ref 5–15)
BUN: 15 mg/dL (ref 8–23)
CO2: 19 mmol/L — ABNORMAL LOW (ref 22–32)
Calcium: 8.5 mg/dL — ABNORMAL LOW (ref 8.9–10.3)
Chloride: 107 mmol/L (ref 98–111)
Creatinine, Ser: 1.46 mg/dL — ABNORMAL HIGH (ref 0.61–1.24)
GFR, Estimated: 49 mL/min — ABNORMAL LOW (ref >60)
Glucose, Bld: 127 mg/dL — ABNORMAL HIGH (ref 70–99)
Potassium: 4 mmol/L (ref 3.5–5.1)
Sodium: 137 mmol/L (ref 135–145)

## 2022-07-11 LAB — HEPARIN LEVEL (UNFRACTIONATED): Heparin Unfractionated: 0.46 IU/mL (ref 0.30–0.70)

## 2022-07-11 MED ORDER — SODIUM CHLORIDE 0.9 % IV SOLN: INTRAVENOUS | Status: AC

## 2022-07-11 MED ORDER — HYDRALAZINE HCL 25 MG PO TABS
25.0000 mg | ORAL_TABLET | Freq: Three times a day (TID) | ORAL | Status: DC
  Administered 2022-07-12 – 2022-07-13 (×5): 25 mg via ORAL
  Filled 2022-07-11 (×5): qty 1

## 2022-07-11 MED ORDER — NITROGLYCERIN 0.4 MG SL SUBL
SUBLINGUAL_TABLET | SUBLINGUAL | Status: AC
  Filled 2022-07-11: qty 2

## 2022-07-11 MED ORDER — IOHEXOL 350 MG/ML SOLN
100.0000 mL | Freq: Once | INTRAVENOUS | Status: AC | PRN
  Administered 2022-07-11: 100 mL via INTRAVENOUS

## 2022-07-11 NOTE — H&P (View-Only) (Signed)
Rounding Note    Patient Name: Collin Bridges Date of Encounter: 07/11/2022  [place] Cardiologist: None   Subjective   Feels okay this morning. No chest pain or SOB.  Coronary CTA with multivessel CAD with FFR positive in m-LAD, mid-to-distal Lcx, prox-RCA. No evidence of pulmonary vein thrombus. Now planned for cath.  Inpatient Medications    Scheduled Meds:  aspirin EC  81 mg Oral Daily   atorvastatin  80 mg Oral Daily   fenofibrate  160 mg Oral Daily   fluticasone  1 spray Each Nare Daily   gabapentin  300 mg Oral QHS   [START ON 07/12/2022] hydrALAZINE  25 mg Oral Q8H   insulin aspart  0-6 Units Subcutaneous TID WC   metoprolol tartrate  50 mg Oral BID   PARoxetine  40 mg Oral Daily   pneumococcal 20-valent conjugate vaccine  0.5 mL Intramuscular Tomorrow-1000   QUEtiapine  50 mg Oral BID   Continuous Infusions:  sodium chloride 100 mL/hr at 07/11/22 1925   azithromycin Stopped (07/10/22 2330)   cefTRIAXone (ROCEPHIN)  IV Stopped (07/10/22 2132)   heparin 1,300 Units/hr (07/11/22 1925)   PRN Meds: acetaminophen **OR** acetaminophen, melatonin, ondansetron (ZOFRAN) IV, sodium chloride   Vital Signs    Vitals:   07/11/22 0055 07/11/22 0513 07/11/22 0717 07/11/22 1635  BP: 115/68 (!) 150/84 122/88 (!) 143/91  Pulse: 76 69 68 65  Resp: '18 18 18 18  '$ Temp: 97.9 F (36.6 C) 98 F (36.7 C) 97.7 F (36.5 C) 98 F (36.7 C)  TempSrc: Oral Oral Oral Oral  SpO2: 92% 92% 96% 95%  Weight:  82.8 kg    Height:        Intake/Output Summary (Last 24 hours) at 07/11/2022 2026 Last data filed at 07/11/2022 1925 Gross per 24 hour  Intake 2508.66 ml  Output 2075 ml  Net 433.66 ml       07/11/2022    5:13 AM 07/10/2022    4:52 AM 07/09/2022    5:46 PM  Last 3 Weights  Weight (lbs) 182 lb 8 oz 183 lb 14.4 oz 184 lb 3.2 oz  Weight (kg) 82.781 kg 83.416 kg 83.553 kg      Telemetry    NSR- Personally Reviewed  ECG    No new tracing today-  Personally Reviewed  Physical Exam   GEN: No acute distress. Comfortable  Neck: No JVD Cardiac: RRR, 2/6 harsh early peaking systolic murmur Respiratory: CTAB, no wheezes GI: Soft, nontender, non-distended  MS: No edema, warm Neuro:  Nonfocal  Psych: Normal affect   Labs    High Sensitivity Troponin:   Recent Labs  Lab 07/08/22 1811 07/08/22 2025 07/09/22 0456  TROPONINIHS 49* 675* 1,286*      Chemistry Recent Labs  Lab 07/08/22 1811 07/08/22 2025 07/09/22 0456 07/10/22 0742 07/11/22 0043  NA 135  --  140 140 137  K 3.9  --  3.6 4.5 4.0  CL 102  --  107 107 107  CO2 22  --  25 20* 19*  GLUCOSE 176*  --  138* 116* 127*  BUN 18  --  '16 12 15  '$ CREATININE 1.52*  --  1.33* 1.33* 1.46*  CALCIUM 9.4  --  8.8* 8.8* 8.5*  MG  --  2.1 2.1  --   --   PROT 6.5  --  5.8*  --   --   ALBUMIN 3.7  --  3.3*  --   --  AST 41  --  34  --   --   ALT 22  --  20  --   --   ALKPHOS 81  --  72  --   --   BILITOT 0.8  --  0.7  --   --   GFRNONAA 47*  --  55* 55* 49*  ANIONGAP 11  --  '8 13 11     '$ Lipids  Recent Labs  Lab 07/10/22 0742  CHOL 214*  TRIG 130  HDL 48  LDLCALC 140*  CHOLHDL 4.5     Hematology Recent Labs  Lab 07/09/22 0456 07/10/22 0742 07/11/22 0043  WBC 10.8* 8.7 7.5  RBC 3.95* 4.20* 3.97*  HGB 12.7* 13.3 12.6*  HCT 36.8* 38.3* 36.5*  MCV 93.2 91.2 91.9  MCH 32.2 31.7 31.7  MCHC 34.5 34.7 34.5  RDW 12.9 12.9 12.6  PLT 99* 110* 100*    Thyroid No results for input(s): "TSH", "FREET4" in the last 168 hours.  BNPNo results for input(s): "BNP", "PROBNP" in the last 168 hours.  DDimer  Recent Labs  Lab 07/09/22 2146  DDIMER 0.91*      Radiology    CT CORONARY MORPH W/CTA COR W/SCORE W/CA W/CM &/OR WO/CM  Addendum Date: 07/11/2022   ADDENDUM REPORT: 07/11/2022 16:23 EXAM: OVER-READ INTERPRETATION  CT CHEST The following report is an over-read performed by radiologist Dr. Fonnie Birkenhead [facility] on 07/11/2022. This over-read  does not include interpretation of cardiac or coronary anatomy or pathology. The interpretation by the cardiologist is attached. COMPARISON:  CT chest 07/11/2022 and 07/09/2022. FINDINGS: Scout view is unremarkable. Scattered patchy peribronchovascular ground-glass. Mild dependent atelectasis bilaterally. Pulmonary nodules measure up to 5 mm in the left lower lobe (12/32). Liver margin is irregular. IMPRESSION: 1. Peribronchovascular ground-glass, likely due to a mild bronchopneumonia. 2. 5 mm left lower lobe nodule. Please refer to diagnostic CT chest performed the same day for further details and recommendations. 3. Cirrhosis. Electronically Signed   By: Lorin Picket M.D.   On: 07/11/2022 16:23   Result Date: 07/11/2022 CLINICAL DATA:  77 year old with CVA and elevated troponin EXAM: Cardiac/Coronary  CTA TECHNIQUE: The patient was scanned on a [employer]. FINDINGS: A 120 kV prospective scan was triggered in the descending thoracic aorta at 111 HU's. Axial non-contrast 3 mm slices were carried out through the heart. The data set was analyzed on a dedicated work station and scored using the [employer]. Gantry rotation speed was 250 msecs and collimation was .6 mm. 0.8 mg of sl NTG was given. The 3D data set was reconstructed in 5% intervals of the 67-82 % of the R-R cycle. Diastolic phases were analyzed on a dedicated work station using MPR, MIP and VRT modes. The patient received 80 cc of contrast. Image quality: good Aorta:  Normal size.  Aortic atherosclerosis.  No dissection. Aortic Valve: Dense  calcifications. Coronary Arteries:  Normal coronary origin.  Right dominance. RCA is a large dominant artery that gives rise to PDA and PLA. Proximal vessel is moderately stenosed 50-69% prior to dense calcified plaque that contains severe stenosis 70-99%. Mid to distal vessel is moderately diffusely diseased. PDA is small and poorly filled. Left main is a large artery that gives rise to LAD and  LCX arteries. There is mixed calcified and non calcified distal LM stenosis 0-24% LAD is a large vessel. There is ostial moderate stenosis 50-69%, proximal calcified severe stenosis 70-99% prior to large D1. Mid  vessel with high risk low attenuation plaque diffuse. Remainder of LAD wraps around apex. -D1 - large vessel, proximal calcified stenosis moderate to severe. -D2 - small caliber, not well filled. LCX is a non-dominant artery that gives rise to one large OM1 branch. Proximal mixed plaque 30-49% mild. Mid AV groove 99% stenosis. -OM1 - large branch with severe proximal vessel mixed plaque, 70-99% Other findings: Normal pulmonary vein drainage into the left atrium. No evidence of PV thrombosis. Normal left atrial appendage without a thrombus. Normal size of the pulmonary artery. Please see radiology report for non cardiac findings. IMPRESSION: 1. Coronary calcium score of 1963. This was 18 percentile for age and sex matched control. 2.  Normal coronary origin with right dominance. 3. Severe proximal LAD, proximal large OM1, and proximal RCA disease (70-99% stenosis). FFR compatible with multivessel flow limiting disease. 4. Aortic valve calcium score 3505: Compatible with severe aortic stenosis. Echocardiogram 06/02/22 showed mild aortic stenosis. 5.  Aortic atherosclerosis. 6.  No evidence of pulmonary vein thrombosis. Electronically Signed: By: Candee Furbish M.D. On: 07/11/2022 15:54   CT CORONARY FRACTIONAL FLOW RESERVE FLUID ANALYSIS  Result Date: 07/11/2022 EXAM: FFRCT ANALYSIS FINDINGS: FFRct analysis was performed on the original cardiac CT angiogram dataset. Diagrammatic representation of the FFRct analysis is provided in a separate PDF document in PACS. This dictation was created using the PDF document and an interactive 3D model of the results. 3D model is not available in the EMR/PACS. Normal FFR range is >0.80. 1. Left Main: Normal 2. LAD: Proximal 0.88 after ostial lesion, mid 0.71 abnormal 3.  LCX: Proximal OM 0.98, mid 0.80, distal 0.72. AV groove mid 0.70, distal 0.56 4. Ramus: N/a 5. RCA: Proximal 0.77, mid 0.62, distal not modeled. IMPRESSION: 1. Severe flow limiting CAD in LAD, LCX, RCA. Discussed with primary team. Note: These examples are not recommendations of [employer] and only provided as examples of what other customers are doing. Electronically Signed   By: Candee Furbish M.D.   On: 07/11/2022 16:01   CT Chest W Contrast  Result Date: 07/11/2022 CLINICAL DATA:  Chest pain. EXAM: CT CHEST WITH CONTRAST TECHNIQUE: Multidetector CT imaging of the chest was performed during intravenous contrast administration. RADIATION DOSE REDUCTION: This exam was performed according to the departmental dose-optimization program which includes automated exposure control, adjustment of the mA and/or kV according to patient size and/or use of iterative reconstruction technique. CONTRAST:  117m OMNIPAQUE IOHEXOL 350 MG/ML SOLN COMPARISON:  July 09, 2022. FINDINGS: Cardiovascular: Atherosclerosis of thoracic aorta is noted without aneurysm or dissection. Normal cardiac size. No pericardial effusion. Extensive coronary artery calcifications are noted suggesting coronary artery disease. Normal opacification of the right inferior pulmonary vein is noted; no definite thrombus is noted. Mediastinum/Nodes: No enlarged mediastinal, hilar, or axillary lymph nodes. Thyroid gland, trachea, and esophagus demonstrate no significant findings. Lungs/Pleura: No pneumothorax or pleural effusion is noted. Right upper lobe airspace opacity is noted concerning for pneumonia as described on prior exam. Minimal bibasilar subsegmental atelectasis is noted. Stable 5 mm nodule is noted in left lower lobe best seen on image number 95 of series 4. Upper Abdomen: Hepatic cirrhosis. Musculoskeletal: No chest wall abnormality. No acute or significant osseous findings. IMPRESSION: Stable right upper lobe airspace opacity is noted  concerning for pneumonia. Stable 5 mm nodule is noted in left lower lobe. Although likely benign, if the patient is high-risk, given the morphology and/or location of this nodule a non-contrast chest CT can be considered in 12 months.This recommendation  follows the consensus statement: Guidelines for Management of Incidental Pulmonary Nodules Detected on CT Images: From the Fleischner Society 2017; Radiology 2017; 284:228-243. Coronary artery calcifications are noted suggesting coronary artery disease. Hepatic cirrhosis. Aortic Atherosclerosis (ICD10-I70.0). Electronically Signed   By: Marijo Conception M.D.   On: 07/11/2022 13:46   VAS US CAROTID  Result Date: 07/11/2022 Carotid Arterial Duplex Study Patient Name:  Virgel Manifold  Date of Exam:   07/11/2022 Medical Rec #: [id number]              Accession #:    [id number] Date of Birth: [birth year]             Patient Gender: M Patient Age:   77 years Exam Location:  [facility] Procedure:      VAS US CAROTID Referring Phys: Egbert Garibaldi --------------------------------------------------------------------------------  Indications:       CVA and CHF, CKD. Risk Factors:      Hypertension, hyperlipidemia, Diabetes. Comparison Study:  No prior study. Performing Technologist: McKayla Maag RVT, [place]  Examination Guidelines: A complete evaluation includes B-mode imaging, spectral Doppler, color Doppler, and power Doppler as needed of all accessible portions of each vessel. Bilateral testing is considered an integral part of a complete examination. Limited examinations for reoccurring indications may be performed as noted.  Right Carotid Findings: +----------+-------+-------+--------+------------------------+-----------------+           PSV    EDV    StenosisPlaque Description      Comments                    cm/s   cm/s                                                      +----------+-------+-------+--------+------------------------+-----------------+ CCA Prox  60     8                                      intimal                                                                   thickening        +----------+-------+-------+--------+------------------------+-----------------+ CCA Distal50     9                                      intimal                                                                   thickening        +----------+-------+-------+--------+------------------------+-----------------+ ICA Prox  57     9      1-39%   irregular and  heterogenous                              +----------+-------+-------+--------+------------------------+-----------------+ ICA Mid   63     19                                                       +----------+-------+-------+--------+------------------------+-----------------+ ICA Distal85     18                                                       +----------+-------+-------+--------+------------------------+-----------------+ ECA       118    13                                                       +----------+-------+-------+--------+------------------------+-----------------+ +----------+--------+-------+----------------+-------------------+           PSV cm/sEDV cmsDescribe        Arm Pressure (mmHG) +----------+--------+-------+----------------+-------------------+ [id number]            Multiphasic, WNL                    +----------+--------+-------+----------------+-------------------+ +---------+--------+--+--------+--+---------+ VertebralPSV cm/s47EDV cm/s13Antegrade +---------+--------+--+--------+--+---------+  Left Carotid Findings: +----------+-------+-------+--------+------------------------+-----------------+           PSV    EDV    StenosisPlaque Description      Comments                     cm/s   cm/s                                                     +----------+-------+-------+--------+------------------------+-----------------+ CCA Prox  108    22                                                       +----------+-------+-------+--------+------------------------+-----------------+ CCA Distal70     16                                     intimal                                                                   thickening        +----------+-------+-------+--------+------------------------+-----------------+ ICA Prox  107    25     1-39%   irregular and  heterogenous                              +----------+-------+-------+--------+------------------------+-----------------+ ICA Mid   96     26                                                       +----------+-------+-------+--------+------------------------+-----------------+ ICA Distal140    38                                                       +----------+-------+-------+--------+------------------------+-----------------+ ECA       115    13                                                       +----------+-------+-------+--------+------------------------+-----------------+ +----------+--------+--------+----------------+-------------------+           PSV cm/sEDV cm/sDescribe        Arm Pressure (mmHG) +----------+--------+--------+----------------+-------------------+ [id number]             Multiphasic, WNL                    +----------+--------+--------+----------------+-------------------+ +---------+--------+--+--------+--+---------+ VertebralPSV cm/s44EDV cm/s12Antegrade +---------+--------+--+--------+--+---------+   Summary: Right Carotid: Velocities in the right ICA are consistent with a 1-39% stenosis. Left Carotid: Velocities in the left ICA are consistent with a 1-39% stenosis.  Vertebrals:  Bilateral vertebral arteries demonstrate antegrade flow. Subclavians: Normal flow hemodynamics were seen in bilateral subclavian              arteries. *See table(s) above for measurements and observations.  Electronically signed by Antony Contras MD on 07/11/2022 at 12:59:42 PM.    Final    MR ANGIO HEAD WO CONTRAST  Result Date: 07/10/2022 CLINICAL DATA:  Stroke follow-up EXAM: MRA HEAD WITHOUT CONTRAST TECHNIQUE: Angiographic images of the Circle of Willis were acquired using MRA technique without intravenous contrast. COMPARISON:  None Available. FINDINGS: POSTERIOR CIRCULATION: --Vertebral arteries: Normal --Inferior cerebellar arteries: Normal. --Basilar artery: Normal. --Superior cerebellar arteries: Normal. --Posterior cerebral arteries: Unchanged appearance of the diminutive right PCA ANTERIOR CIRCULATION: --Intracranial internal carotid arteries: Normal. --Anterior cerebral arteries (ACA): Unchanged posteriorly projecting focus from the anterior communicating artery measuring 1 x 2 mm. --Middle cerebral arteries (MCA): Normal. IMPRESSION: 1. No emergent large vessel occlusion or high-grade stenosis. 2. Unchanged appearance of the diminutive right PCA. 3. 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 Electronically Signed   By: Ulyses Jarred M.D.   On: 07/10/2022 03:42    Cardiac Studies    Coronary CTA 07/11/22: FINDINGS: A 120 kV prospective scan was triggered in the descending thoracic aorta at 111 HU's. Axial non-contrast 3 mm slices were carried out through the heart. The data set was analyzed on a dedicated work station and scored using the [employer]. Gantry rotation speed was 250 msecs and collimation was .6 mm. 0.8 mg of sl NTG was given. The 3D data set was reconstructed in 5% intervals of the 67-82 % of  the R-R cycle. Diastolic phases were analyzed on a dedicated work station using MPR, MIP and VRT modes. The patient received 80 cc of contrast.    Image quality: good   Aorta:  Normal size.  Aortic atherosclerosis.  No dissection.   Aortic Valve: Dense  calcifications.   Coronary Arteries:  Normal coronary origin.  Right dominance.   RCA is a large dominant artery that gives rise to PDA and PLA. Proximal vessel is moderately stenosed 50-69% prior to dense calcified plaque that contains severe stenosis 70-99%. Mid to distal vessel is moderately diffusely diseased. PDA is small and poorly filled.   Left main is a large artery that gives rise to LAD and LCX arteries. There is mixed calcified and non calcified distal LM stenosis 0-24%   LAD is a large vessel. There is ostial moderate stenosis 50-69%, proximal calcified severe stenosis 70-99% prior to large D1. Mid vessel with high risk low attenuation plaque diffuse. Remainder of LAD wraps around apex.   -D1 - large vessel, proximal calcified stenosis moderate to severe.   -D2 - small caliber, not well filled.   LCX is a non-dominant artery that gives rise to one large OM1 branch. Proximal mixed plaque 30-49% mild. Mid AV groove 99% stenosis.   -OM1 - large branch with severe proximal vessel mixed plaque, 70-99%   Other findings:   Normal pulmonary vein drainage into the left atrium. No evidence of PV thrombosis.   Normal left atrial appendage without a thrombus.   Normal size of the pulmonary artery.   Please see radiology report for non cardiac findings.   IMPRESSION: 1. Coronary calcium score of 1963. This was 32 percentile for age and sex matched control.   2.  Normal coronary origin with right dominance.   3. Severe proximal LAD, proximal large OM1, and proximal RCA disease (70-99% stenosis). FFR compatible with multivessel flow limiting disease.   4. Aortic valve calcium score 3505: Compatible with severe aortic stenosis. Echocardiogram 06/02/22 showed mild aortic stenosis.   5.  Aortic atherosclerosis.   6.  No evidence of pulmonary vein  thrombosis.   ECHO: 07/09/2022 Sonographer Comments: Image acquisition challenging due to respiratory  motion.  IMPRESSIONS   1. Left ventricular ejection fraction, by estimation, is 60 to 65%. The left ventricle has normal function. The left ventricle demonstrates regional wall motion abnormalities (see scoring diagram/findings for description).  The basal inferior segment is hypokinetic.Left ventricular diastolic parameters are consistent with Grade I diastolic dysfunction (impaired relaxation).   2. Right ventricular systolic function is hyperdynamic. The right  ventricular size is mildly enlarged.   3. Calcified subchordal appatatus and previously describev. The mitral valve is abnormal. Trivial mitral valve regurgitation. No evidence of mitral stenosis. Moderate mitral annular calcification.   4. The aortic valve is abnormal. Aortic valve regurgitation is not  visualized. Mild aortic valve stenosis.   5. The pulmonary veins are not sufficiently imaged to evalution for pulmonary vein thrombus based off of this study.   CTA Chest 07/09/22: IMPRESSION: 1. No sign of pulmonary embolism. 2. Posterior division inferior pulmonary vein on the RIGHT and inferior pulmonary vein showing lack of opacification. This extends to the orifice of the RIGHT inferior pulmonary vein and perhaps even into the periphery of the LEFT atrium. This is moderately suspicious for thrombus made more suspicious by the normal opacification of other pulmonary venous branches in the chest. Consider cardiology consult confirmation with venous phase assessment of the chest is suggested. 3. Multiple areas  of patchy ground-glass and discrete nodularity in the RIGHT upper lobe. Findings favor an infectious or inflammatory etiology. 4. Better defined lower lobe nodules largest at 6 mm. Given the multifocal nature of findings in the chest and concern for infection but some indeterminate more discrete nodular areas with  suggest 8-12 week follow-up outside of the acute setting to ensure resolution. 5. Marked hepatic cirrhosis 6. Aortic atherosclerosis and coronary artery disease.     Patient Profile     77 y.o. male with a hx of CVA, DM2, hypertension, hyperlipidemia, hep C, and CKD IIIA who presented to the ER with severe sepsis from CAP with course complicated by chest pain and elevated troponin for which Cardiology has been consulted.   Assessment & Plan    #NSTEMI: #Multivessel CAD Patient presented with chest pressure in the setting of community acquired PNA. Trop [phone]. ECG with no acute ischemic changes. TTE with normal BiV function and no significant WMA. Coronary CTA with severe multivessel disease. Now planned for cath, -Plan for LHC today -Continue heparin gtt  -Continue ASA '81mg'$  daily, plavix '75mg'$  daily -Continue lipitor '40mg'$  daily -Hold losartan given plans for cath -Change metop to coreg 12.'5mg'$  BID  #Community Acquired PNA: Patient presented with fevers, rigors, chest discomfort and SOB found to have community acquired PNA. Now on ABX per primary. Feeling significantly better with improvement of symptoms. -Continue CTX  #CVA: MRI brain with small recurrent acute or subacute infarcts that are lacunar in nature likely related to chronic small disease. -Continue ASA, plavix and lipitor as above -Management per Neuro  #HTN: -Change metop to coreg -Holding losartan prior to cath  #Mild AS: -Continue serial monitoring as outpatient  #HLD: -Continue lipitor as above  #DMII: -Management per primary       For questions or updates, please contact [facility] Please consult [email or web address] for contact info under        Signed, Freada Bergeron, MD  07/11/2022, 8:26 PM

## 2022-07-11 NOTE — Progress Notes (Signed)
STROKE TEAM PROGRESS NOTE   INTERVAL HISTORY: No one is at the bedside.  Patient continues to do well.  He has no complaints today.  Vital signs are stable.  Neurological exam is unchanged. Doing well. No new weakness. On heparin ggt. cardiology has seen him and will arrange for TEE  Vitals:   07/10/22 2113 07/11/22 0055 07/11/22 0513 07/11/22 0717  BP: (!) 158/93 115/68 (!) 150/84 122/88  Pulse:  76 69 68  Resp:  '18 18 18  '$ Temp:  97.9 F (36.6 C) 98 F (36.7 C) 97.7 F (36.5 C)  TempSrc:  Oral Oral Oral  SpO2:  92% 92% 96%  Weight:   82.8 kg   Height:       CBC:  Recent Labs  Lab 07/09/22 0456 07/10/22 0742 07/11/22 0043  WBC 10.8* 8.7 7.5  NEUTROABS 8.1*  --   --   HGB 12.7* 13.3 12.6*  HCT 36.8* 38.3* 36.5*  MCV 93.2 91.2 91.9  PLT 99* 110* 123XX123*   Basic Metabolic Panel:  Recent Labs  Lab 07/08/22 2025 07/09/22 0456 07/10/22 0742 07/11/22 0043  NA  --  140 140 137  K  --  3.6 4.5 4.0  CL  --  107 107 107  CO2  --  25 20* 19*  GLUCOSE  --  138* 116* 127*  BUN  --  '16 12 15  '$ CREATININE  --  1.33* 1.33* 1.46*  CALCIUM  --  8.8* 8.8* 8.5*  MG 2.1 2.1  --   --    Lipid Panel:  Recent Labs  Lab 07/10/22 0742  CHOL 214*  TRIG 130  HDL 48  CHOLHDL 4.5  VLDL 26  LDLCALC 140*   HgbA1c:  Recent Labs  Lab 07/09/22 2146  HGBA1C 6.3*   Urine Drug Screen: No results for input(s): "LABOPIA", "COCAINSCRNUR", "LABBENZ", "AMPHETMU", "THCU", "LABBARB" in the last 168 hours.  Alcohol Level No results for input(s): "ETH" in the last 168 hours.  IMAGING past 24 hours VAS US CAROTID  Result Date: 07/11/2022 Carotid Arterial Duplex Study Patient Name:  Collin Bridges  Date of Exam:   07/11/2022 Medical Rec #: [id number]              Accession #:    [id number] Date of Birth: 1946-02-17             Patient Gender: M Patient Age:   35 years Exam Location:  [facility] [place] Procedure:      VAS US CAROTID Referring Phys: Egbert Garibaldi  --------------------------------------------------------------------------------  Indications:       CVA and CHF, CKD. Risk Factors:      Hypertension, hyperlipidemia, Diabetes. Comparison Study:  No prior study. Performing Technologist: McKayla Maag RVT, [place]  Examination Guidelines: A complete evaluation includes B-mode imaging, spectral Doppler, color Doppler, and power Doppler as needed of all accessible portions of each vessel. Bilateral testing is considered an integral part of a complete examination. Limited examinations for reoccurring indications may be performed as noted.  Right Carotid Findings: +----------+-------+-------+--------+------------------------+-----------------+           PSV    EDV    StenosisPlaque Description      Comments                    cm/s   cm/s                                                     +----------+-------+-------+--------+------------------------+-----------------+  CCA Prox  60     8                                      intimal                                                                   thickening        +----------+-------+-------+--------+------------------------+-----------------+ CCA Distal50     9                                      intimal                                                                   thickening        +----------+-------+-------+--------+------------------------+-----------------+ ICA Prox  57     9      1-39%   irregular and                                                             heterogenous                              +----------+-------+-------+--------+------------------------+-----------------+ ICA Mid   63     19                                                       +----------+-------+-------+--------+------------------------+-----------------+ ICA Distal85     18                                                        +----------+-------+-------+--------+------------------------+-----------------+ ECA       118    13                                                       +----------+-------+-------+--------+------------------------+-----------------+ +----------+--------+-------+----------------+-------------------+           PSV cm/sEDV cmsDescribe        Arm Pressure (mmHG) +----------+--------+-------+----------------+-------------------+ [id number]            Multiphasic, WNL                    +----------+--------+-------+----------------+-------------------+ +---------+--------+--+--------+--+---------+  VertebralPSV cm/s47EDV cm/s13Antegrade +---------+--------+--+--------+--+---------+  Left Carotid Findings: +----------+-------+-------+--------+------------------------+-----------------+           PSV    EDV    StenosisPlaque Description      Comments                    cm/s   cm/s                                                     +----------+-------+-------+--------+------------------------+-----------------+ CCA Prox  108    22                                                       +----------+-------+-------+--------+------------------------+-----------------+ CCA Distal70     16                                     intimal                                                                   thickening        +----------+-------+-------+--------+------------------------+-----------------+ ICA Prox  107    25     1-39%   irregular and                                                             heterogenous                              +----------+-------+-------+--------+------------------------+-----------------+ ICA Mid   96     26                                                       +----------+-------+-------+--------+------------------------+-----------------+ ICA Distal140    38                                                        +----------+-------+-------+--------+------------------------+-----------------+ ECA       115    13                                                       +----------+-------+-------+--------+------------------------+-----------------+ +----------+--------+--------+----------------+-------------------+  PSV cm/sEDV cm/sDescribe        Arm Pressure (mmHG) +----------+--------+--------+----------------+-------------------+ [id number]             Multiphasic, WNL                    +----------+--------+--------+----------------+-------------------+ +---------+--------+--+--------+--+---------+ VertebralPSV cm/s44EDV cm/s12Antegrade +---------+--------+--+--------+--+---------+   Summary: Right Carotid: Velocities in the right ICA are consistent with a 1-39% stenosis. Left Carotid: Velocities in the left ICA are consistent with a 1-39% stenosis. Vertebrals:  Bilateral vertebral arteries demonstrate antegrade flow. Subclavians: Normal flow hemodynamics were seen in bilateral subclavian              arteries. *See table(s) above for measurements and observations.  Electronically signed by Antony Contras MD on 07/11/2022 at 12:59:42 PM.    Final     PHYSICAL EXAM General - well nourished, well developed pleasant elderly Caucasian male, in no apparent distress.     Ophthalmologic - fundi not visualized due to noncooperation.     Cardiovascular - regular rhythm and rate   Mental Status -  Alert. A/O x 4.    Cranial Nerves II - XII - II - Vision intact OU. III, IV, VI - Extraocular movements intact. V - Facial sensation intact bilaterally. VII - Facial movement intact bilaterally. VIII - Hearing & vestibular intact bilaterally. X - Palate elevates symmetrically. XI - Chin turning & shoulder shrug intact bilaterally. XII - Tongue protrusion intact.   Motor Strength - [name] was normal in all extremities and pronator drift was absent.    Motor Tone  & Bulk - Muscle tone was assessed at the neck and appendages and was normal.  Bulk was normal and fasciculations were absent.   Sensory - normal to LT.   Coordination - no ataxia.    Gait and Station - deferred  ASSESSMENT/PLAN Collin Bridges is a 77 y.o. male history of TIA being followed by Dr. Leonie Man outpatient, heart failure admitted initially for possible sepsis with pneumonia found to have pulmonary  vein decreased flow ? thrombus on CTA cardiology consulted.  MRI shows scattered bilateral subcortical strokes etiology likely small vessel disease.  Doubt cardioembolic  MR angio: [id number] ACA aneurysm-unchanged.   MRI : scattered strokes in both hemispheres mostly subcortical and periventricular. 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  Carotid Doppler  pending. 2D Echo 60-65%. Calcified mitral valve. TEE pending . LDL 140 goal LDL<70.  HgbA1c 6.3 VTE prophylaxis - on heparin ggt.     Diet   Diet regular Room service appropriate? Yes; Fluid consistency: Thin   Plavix prior to admission, now on heparin IV.  Therapy recommendations:  pending. Disposition:  pending.  Hypertension Permissive hypertension (OK if < 220/120) but gradually normalize in 5-7 days Long-term BP goal normotensive  Hyperlipidemia Home meds:  lipitor '40mg'$ , resumed in hospital LDL 140, goal < 70 High intensity statin now on '80mg'$  lipitor. Continue statin at discharge  Diabetes type II Controlled  HgbA1c 6.3, goal < 7.0 CBGs Recent Labs    07/10/22 2120 07/11/22 0618 07/11/22 1115  GLUCAP 132* 112* 112*    SSI  Other Stroke Risk Factors Advanced Age >/= 64  Hospital day # 3  I have personally obtained history,examined this patient, reviewed notes, independently viewed imaging studies, participated in medical decision making and plan of care.ROS completed by me personally and pertinent positives fully documented  I have made any additions or clarifications directly to the above note. Agree with  note above.  Patient  presented with bilateral subcortical infarcts likely from small vessel disease.  CT angiogram suggests abnormal flow in the pulmonary vein raising question of thrombus.  Continue IV heparin for now till TEE is done and pulmonary vein thrombosis is confirmed on.  Discussed with patient and Dr. Emeline Gins.  Greater than 50% time during this 35-minute visit was spent in counseling and coordination of care and discussion with patient and care team and answering questions about his strokes and abnormal brain MRI and CT angiogram findings.  Antony Contras, MD Medical Director [facility] Pager: [phone] 07/11/2022 1:09 PM   To contact Stroke Continuity provider, please refer to [email or web address] After hours, contact General Neurology

## 2022-07-11 NOTE — Progress Notes (Signed)
Bilateral carotid ultrasound study completed.   Please see CV Procedures for preliminary results.  Kamila Broda, RVT  12:04 PM 07/11/22

## 2022-07-11 NOTE — Care Management Important Message (Signed)
Important Message  Patient Details  Name: Collin Bridges MRN: [id number] Date of Birth: 10/26/1945   Medicare Important Message Given:  Yes     Shelda Altes 07/11/2022, 8:46 AM

## 2022-07-11 NOTE — Progress Notes (Signed)
ANTICOAGULATION CONSULT NOTE - Follow Up Consult  Pharmacy Consult for Heparin Indication:  CVA/ right inferior pulmonary vein thrombus  No Known Allergies  Patient Measurements: Height: 6' (182.9 cm) Weight: 82.8 kg (182 lb 8 oz) IBW/kg (Calculated) : 77.6 Heparin Dosing Weight: 82.8 kg  Vital Signs: Temp: 97.7 F (36.5 C) (02/26 0717) Temp Source: Oral (02/26 0717) BP: 122/88 (02/26 0717) Pulse Rate: 68 (02/26 0717)  Labs: Recent Labs    07/08/22 1811 07/08/22 2025 07/09/22 0456 07/09/22 2146 07/10/22 0742 07/11/22 0043  HGB 13.3  --  12.7*  --  13.3 12.6*  HCT 38.7*  --  36.8*  --  38.3* 36.5*  PLT 121*  --  99*  --  110* 100*  HEPARINUNFRC  --   --   --  0.22* 0.50 0.46  CREATININE 1.52*  --  1.33*  --  1.33* 1.46*  TROPONINIHS 49* 675* 1,286*  --   --   --     Estimated Creatinine Clearance: 46.5 mL/min (A) (by C-G formula based on SCr of 1.46 mg/dL (H)).  Assessment: 77 yo male admitted with CVA, CTA negative for PE, but suspicious for pulmonary vein thrombosis. No anticoagulation prior to admission, was taking Aspirin 81 mg and Plavix.  MRI shows scattered bilateral strokes with cardioembolic etiology. Pharmacy consulted to dose heparin.  No boluses, low-therapeutic goal.  Heparin level is therapeutic (0.46) on 1300 units/hr. Platelet count low stable. Hgb stable. Bloody nose reported last night. Patient states due to dryness and resolved. Has Saline nasal spray for prn use.  Plavix was stopped after 2/24 dose. Aspirin 81 mg daily continues.  Goal of Therapy:  Heparin level 0.3-0.5 units/ml Monitor platelets by anticoagulation protocol: Yes   Plan:  Continue heparin drip at 1300 units/hr Daily heparin level and CBC Monitor for signs/symptoms of bleeding. Follow up studies and anticoagulation plans.  Arty Baumgartner, RPh 07/11/2022,9:41 AM

## 2022-07-11 NOTE — Progress Notes (Signed)
PROGRESS NOTE    Collin Bridges  [id number] DOB: [birth year] DOA: 07/08/2022 PCP: [name], MD   Chief Complaint  Patient presents with   Chest Pain   Fever   flu symptoms    Brief Narrative:   Collin Bridges is a 77 y.o. male with medical history significant for chronic diastolic heart failure, mild aortic stenosis, CKD 3 ACEs with baseline creatinine 1.3-1.5, type 2 diabetes mellitus, hypertension, hyperlipidemia, who presents to ED secondary to complaints of fever, chills, flu symptoms and chest pain, imaging significant for Pneumonia, he was started on empiric antibiotic coverage, troponins keep trending up, he does endorse pleuritic chest pain, so CTA chest was obtained which was negative for PE, but significant for lack of opacification and pulmonary veins extending into the left atrium suspicious for pulmonary vein thrombosis, patient had MRI brain done as an outpatient 07/08/2022 which was significant for thromboembolic CVA.  Assessment & Plan:   Principal Problem:   CAP (community acquired pneumonia) Active Problems:   Essential hypertension   Mild aortic stenosis   Hyperlipidemia   Stage 3a chronic kidney disease (CKD) (HCC)   DM2 (diabetes mellitus, type 2) (HCC)   Severe sepsis (HCC)   Atypical chest pain   Elevated troponin   Depression   Allergic rhinitis   Chronic diastolic CHF (congestive heart failure) ([place])   Questionable pulmonary vein thrombosis/atherothrombosis -As evident on CTA chest, as well presence of thromboembolic CVAs support this diagnosis. -on full anticoagulation, continue with heparin GTT for now, low-dose protocol given acute CVA as well. -Cardiology assistance greatly appreciated, plan for CTA coronaries, with IV contrast for further evaluation to confirm pulmonary vein thrombosis.  Acute CVA -MRI brain significant for acute/subacute multiple CVAs in both hemispheres, this is most likely due to above, started on  full anticoagulation. -Management per neurology  NSTEMI -2D echo with a preserved EF but with regional wall motion abnormality -On heparin gtt. due to above -Continue with continue with aspirin, statin, fenofibrate, Cozaar, started on beta-blockers as well -Cardiology input greatly appreciated -plan for CT coronary  Sepsis secondary to pneumonia -continue with IV Rocephin and azithromycin -Blood cultures remain negative  History of aortic stenosis -Cardiology on board  Type 2 diabetes mellitus -A1c 6.3 last January -Continue with insulin sliding scale  Depression -continue with Paxil  Hyperlipidemia -DL is elevated at 142, will increase his Lipitor to 80 mg.  Hypertension -Blood pressure acceptable, continue with losartan  Pulm nodules -Will need repeat imaging in 8 to 12 weeks  Hepatic cirrhosis Thrombocytopenia -Is followed by Dr. Candis Schatz, known history of hep C treated with interferon in 1999   DVT prophylaxis: Heparin GTT Code Status: Full Family Communication: none at bedside Disposition:   Status is: Inpatient    Consultants:  Cardiology Neurology   Subjective:  No significant events overnight, he denies any complaints today.  Objective: Vitals:   07/10/22 2113 07/11/22 0055 07/11/22 0513 07/11/22 0717  BP: (!) 158/93 115/68 (!) 150/84 122/88  Pulse:  76 69 68  Resp:  '18 18 18  '$ Temp:  97.9 F (36.6 C) 98 F (36.7 C) 97.7 F (36.5 C)  TempSrc:  Oral Oral Oral  SpO2:  92% 92% 96%  Weight:   82.8 kg   Height:        Intake/Output Summary (Last 24 hours) at 07/11/2022 1038 Last data filed at 07/11/2022 [id number] Gross per 24 hour  Intake 1151.19 ml  Output 2375 ml  Net -1223.81 ml  Filed Weights   07/09/22 1746 07/10/22 0452 07/11/22 0513  Weight: 83.6 kg 83.4 kg 82.8 kg    Examination:  Awake Alert, Oriented X 3, No new F.N deficits, Normal affect Symmetrical Chest wall movement, Good air movement bilaterally, CTAB RRR,No  Gallops,Rubs or new Murmurs, No Parasternal Heave +ve B.Sounds, Abd Soft, No tenderness, No rebound - guarding or rigidity. No Cyanosis, Clubbing or edema, No new Rash or bruise       Data Reviewed: I have personally reviewed following labs and imaging studies  CBC: Recent Labs  Lab 07/08/22 1811 07/09/22 0456 07/10/22 0742 07/11/22 0043  WBC 16.6* 10.8* 8.7 7.5  NEUTROABS  --  8.1*  --   --   HGB 13.3 12.7* 13.3 12.6*  HCT 38.7* 36.8* 38.3* 36.5*  MCV 92.1 93.2 91.2 91.9  PLT 121* 99* 110* 100*    Basic Metabolic Panel: Recent Labs  Lab 07/08/22 1811 07/08/22 2025 07/09/22 0456 07/10/22 0742 07/11/22 0043  NA 135  --  140 140 137  K 3.9  --  3.6 4.5 4.0  CL 102  --  107 107 107  CO2 22  --  25 20* 19*  GLUCOSE 176*  --  138* 116* 127*  BUN 18  --  '16 12 15  '$ CREATININE 1.52*  --  1.33* 1.33* 1.46*  CALCIUM 9.4  --  8.8* 8.8* 8.5*  MG  --  2.1 2.1  --   --     GFR: Estimated Creatinine Clearance: 46.5 mL/min (A) (by C-G formula based on SCr of 1.46 mg/dL (H)).  Liver Function Tests: Recent Labs  Lab 07/08/22 1811 07/09/22 0456  AST 41 34  ALT 22 20  ALKPHOS 81 72  BILITOT 0.8 0.7  PROT 6.5 5.8*  ALBUMIN 3.7 3.3*    CBG: Recent Labs  Lab 07/10/22 0606 07/10/22 1127 07/10/22 1605 07/10/22 2120 07/11/22 0618  GLUCAP 117* 133* 114* 132* 112*     Recent Results (from the past 240 hour(s))  Resp panel by RT-PCR (RSV, Flu A&B, Covid) Anterior Nasal Swab     Status: None   Collection Time: 07/08/22  6:08 PM   Specimen: Anterior Nasal Swab  Result Value Ref Range Status   SARS Coronavirus 2 by RT PCR NEGATIVE NEGATIVE Final   Influenza A by PCR NEGATIVE NEGATIVE Final   Influenza B by PCR NEGATIVE NEGATIVE Final    Comment: (NOTE) The Xpert Xpress SARS-CoV-2/FLU/RSV plus assay is intended as an aid in the diagnosis of influenza from Nasopharyngeal swab specimens and should not be used as a sole basis for treatment. Nasal washings and aspirates  are unacceptable for Xpert Xpress SARS-CoV-2/FLU/RSV testing.  Fact Sheet for Patients: EntrepreneurPulse.com.au  Fact Sheet for Healthcare Providers: IncredibleEmployment.be  This test is not yet approved or cleared by the [place] FDA and has been authorized for detection and/or diagnosis of SARS-CoV-2 by FDA under an Emergency Use Authorization (EUA). This EUA will remain in effect (meaning this test can be used) for the duration of the COVID-19 declaration under Section 564(b)(1) of the Act, 21 U.S.C. section 360bbb-3(b)(1), unless the authorization is terminated or revoked.     Resp Syncytial Virus by PCR NEGATIVE NEGATIVE Final    Comment: (NOTE) Fact Sheet for Patients: EntrepreneurPulse.com.au  Fact Sheet for Healthcare Providers: IncredibleEmployment.be  This test is not yet approved or cleared by the [place] FDA and has been authorized for detection and/or diagnosis of SARS-CoV-2 by FDA under an Emergency Use Authorization (  EUA). This EUA will remain in effect (meaning this test can be used) for the duration of the COVID-19 declaration under Section 564(b)(1) of the Act, 21 U.S.C. section 360bbb-3(b)(1), unless the authorization is terminated or revoked.  Performed at [facility], [place] [address], [place], [place] [address]   Respiratory (~20 pathogens) panel by PCR     Status: None   Collection Time: 07/08/22  6:08 PM   Specimen: Nasopharyngeal Swab; Respiratory  Result Value Ref Range Status   Adenovirus NOT DETECTED NOT DETECTED Final   Coronavirus 229E NOT DETECTED NOT DETECTED Final    Comment: (NOTE) The Coronavirus on the Respiratory Panel, DOES NOT test for the novel  Coronavirus (2019 nCoV)    Coronavirus HKU1 NOT DETECTED NOT DETECTED Final   Coronavirus NL63 NOT DETECTED NOT DETECTED Final   Coronavirus OC43 NOT DETECTED NOT DETECTED Final   Metapneumovirus NOT  DETECTED NOT DETECTED Final   Rhinovirus / Enterovirus NOT DETECTED NOT DETECTED Final   Influenza A NOT DETECTED NOT DETECTED Final   Influenza B NOT DETECTED NOT DETECTED Final   Parainfluenza Virus 1 NOT DETECTED NOT DETECTED Final   Parainfluenza Virus 2 NOT DETECTED NOT DETECTED Final   Parainfluenza Virus 3 NOT DETECTED NOT DETECTED Final   Parainfluenza Virus 4 NOT DETECTED NOT DETECTED Final   Respiratory Syncytial Virus NOT DETECTED NOT DETECTED Final   Bordetella pertussis NOT DETECTED NOT DETECTED Final   Bordetella Parapertussis NOT DETECTED NOT DETECTED Final   Chlamydophila pneumoniae NOT DETECTED NOT DETECTED Final   Mycoplasma pneumoniae NOT DETECTED NOT DETECTED Final    Comment: Performed at [facility], [place]. [address] [place], [place] [address]  Culture, blood (routine x 2)     Status: None (Preliminary result)   Collection Time: 07/08/22 10:24 PM   Specimen: BLOOD  Result Value Ref Range Status   Specimen Description BLOOD BLOOD RIGHT HAND  Final   Special Requests   Final    BOTTLES DRAWN AEROBIC AND ANAEROBIC Blood Culture results may not be optimal due to an inadequate volume of blood received in culture bottles   Culture   Final    NO GROWTH 2 DAYS Performed at [facility], [place] [address] [place], [place] [address]    Report Status PENDING  Incomplete  Culture, blood (routine x 2)     Status: None (Preliminary result)   Collection Time: 07/08/22 11:15 PM   Specimen: BLOOD  Result Value Ref Range Status   Specimen Description BLOOD SITE NOT SPECIFIED  Final   Special Requests   Final    BOTTLES DRAWN AEROBIC AND ANAEROBIC Blood Culture results may not be optimal due to an inadequate volume of blood received in culture bottles   Culture   Final    NO GROWTH 2 DAYS Performed at [facility], [place] [address] [place],  [address]    Report Status PENDING  Incomplete         Radiology Studies: VAS [place] LOWER EXTREMITY VENOUS  (DVT)  Result Date: 07/10/2022  Lower Venous DVT Study Patient Name:  MOICES MORRICE  Date of Exam:   07/09/2022 Medical Rec #: [id number]              Accession #:    [id number] Date of Birth: 12-Mar-1946             Patient Gender: M Patient Age:   61 years Exam Location:  [facility]  Procedure:      VAS [place] LOWER EXTREMITY VENOUS (DVT) Referring Phys: Sreya Froio --------------------------------------------------------------------------------  Indications: Swelling.  Comparison Study: No prior study on file Performing Technologist: Sharion Dove RVS  Examination Guidelines: A complete evaluation includes B-mode imaging, spectral Doppler, color Doppler, and power Doppler as needed of all accessible portions of each vessel. Bilateral testing is considered an integral part of a complete examination. Limited examinations for reoccurring indications may be performed as noted. The reflux portion of the exam is performed with the patient in reverse Trendelenburg.  +---------+---------------+---------+-----------+----------+--------------+ RIGHT    CompressibilityPhasicitySpontaneityPropertiesThrombus Aging +---------+---------------+---------+-----------+----------+--------------+ CFV      Full           Yes      Yes                                 +---------+---------------+---------+-----------+----------+--------------+ SFJ      Full                                                        +---------+---------------+---------+-----------+----------+--------------+ FV Prox  Full                                                        +---------+---------------+---------+-----------+----------+--------------+ FV Mid   Full                                                        +---------+---------------+---------+-----------+----------+--------------+ FV DistalFull                                                         +---------+---------------+---------+-----------+----------+--------------+ PFV      Full                                                        +---------+---------------+---------+-----------+----------+--------------+ POP      Full           Yes      Yes                                 +---------+---------------+---------+-----------+----------+--------------+ PTV      Full                                                        +---------+---------------+---------+-----------+----------+--------------+ PERO     Full                                                        +---------+---------------+---------+-----------+----------+--------------+   +---------+---------------+---------+-----------+----------+--------------+  LEFT     CompressibilityPhasicitySpontaneityPropertiesThrombus Aging +---------+---------------+---------+-----------+----------+--------------+ CFV      Full           Yes      Yes                                 +---------+---------------+---------+-----------+----------+--------------+ SFJ      Full                                                        +---------+---------------+---------+-----------+----------+--------------+ FV Prox  Full                                                        +---------+---------------+---------+-----------+----------+--------------+ FV Mid   Full                                                        +---------+---------------+---------+-----------+----------+--------------+ FV DistalFull                                                        +---------+---------------+---------+-----------+----------+--------------+ PFV      Full                                                        +---------+---------------+---------+-----------+----------+--------------+ POP      Full           Yes      Yes                                  +---------+---------------+---------+-----------+----------+--------------+ PTV      Full                                                        +---------+---------------+---------+-----------+----------+--------------+ PERO     Full                                                        +---------+---------------+---------+-----------+----------+--------------+     Summary: BILATERAL: - No evidence of deep vein thrombosis seen in the lower extremities, bilaterally. -No evidence of popliteal cyst, bilaterally.   *See table(s) above for measurements and observations. Electronically signed by Orlie Pollen on 07/10/2022 at 9:30:57 AM.  Final    MR ANGIO HEAD WO CONTRAST  Result Date: 07/10/2022 CLINICAL DATA:  Stroke follow-up EXAM: MRA HEAD WITHOUT CONTRAST TECHNIQUE: Angiographic images of the Circle of Willis were acquired using MRA technique without intravenous contrast. COMPARISON:  None Available. FINDINGS: POSTERIOR CIRCULATION: --Vertebral arteries: Normal --Inferior cerebellar arteries: Normal. --Basilar artery: Normal. --Superior cerebellar arteries: Normal. --Posterior cerebral arteries: Unchanged appearance of the diminutive right PCA ANTERIOR CIRCULATION: --Intracranial internal carotid arteries: Normal. --Anterior cerebral arteries (ACA): Unchanged posteriorly projecting focus from the anterior communicating artery measuring 1 x 2 mm. --Middle cerebral arteries (MCA): Normal. IMPRESSION: 1. No emergent large vessel occlusion or high-grade stenosis. 2. Unchanged appearance of the diminutive right PCA. 3. 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 Electronically Signed   By: Ulyses Jarred M.D.   On: 07/10/2022 03:42   CT Angio Chest Pulmonary Embolism (PE) W or WO Contrast  Addendum Date: 07/09/2022   ADDENDUM REPORT: 07/09/2022 12:35 ADDENDUM: Of note the patient also underwent a brain MRI of July 08, 2022. Interpretation is pending but preliminarily review with  neuroradiology shows stigmata of multiple small infarcts particularly in the LEFT cerebral hemisphere and also likely in the LEFT basal ganglia, most suggestive of embolic phenomenon. This further supports the possibility of pulmonary venous side and LEFT atrial extension of thrombus from RIGHT inferior pulmonary vein. Suggest neurologic and cardiology consultation in this complex patient who also has stigmata of liver disease which is quite severe based on hepatic morphology. Critical Value/emergent results were called by telephone at the time of interpretation on 07/09/2022 at 12:35 pm to provider Urvi Imes [facility] , who verbally acknowledged these results. Electronically Signed   By: Zetta Bills M.D.   On: 07/09/2022 12:35   Result Date: 07/09/2022 CLINICAL DATA:  Chest pain, elevated troponin. Rule out pulmonary embolism. EXAM: CT ANGIOGRAPHY CHEST WITH CONTRAST TECHNIQUE: Multidetector CT imaging of the chest was performed using the standard protocol during bolus administration of intravenous contrast. Multiplanar CT image reconstructions and MIPs were obtained to evaluate the vascular anatomy. RADIATION DOSE REDUCTION: This exam was performed according to the departmental dose-optimization program which includes automated exposure control, adjustment of the mA and/or kV according to patient size and/or use of iterative reconstruction technique. CONTRAST:  29m OMNIPAQUE IOHEXOL 350 MG/ML SOLN COMPARISON:  Abdominal imaging from February 25, 2022 FINDINGS: Cardiovascular: Calcified and noncalcified aortic atherosclerotic plaque. No gross acute aortic process though assessment is limited by bolus timing, study performed for evaluation of pulmonary arteries. Heart size is normal without pericardial effusion or nodularity. There is three-vessel coronary artery disease. Main pulmonary artery is opacified to 4 in 16 Hounsfield units. There is no sign of pulmonary embolism. Posterior division inferior pulmonary  vein on the RIGHT and inferior pulmonary vein showing variable attenuation. There is peripheral ground-glass in there is non opacification of posterior division of the RIGHT inferior pulmonary vein seen on image 214/6. All other branches of the pulmonary venous system on the RIGHT and LEFT appear well opacified. Low-attenuation extending into the RIGHT inferior pulmonary vein and low attenuation present at the orifice of the vein in the LEFT atrium. Mediastinum/Nodes: Thoracic inlet is unremarkable. No axillary lymphadenopathy. No mediastinal or gross hilar lymphadenopathy with scattered small lymph nodes in the chest. Lungs/Pleura: Patchy ground-glass opacities some areas with distinct nodularity, for example on image 55/7 largest area measuring 7 mm discrete from a more geographic area that measures up to 5 cm in the RIGHT upper lobe. Another area of more discrete  ground-glass measuring 12 x 9 mm. Patchy areas of ground-glass attenuation in posterior RIGHT lower lobe in areas subtended by the venous abnormality seen above. There is a discrete RIGHT lower lobe nodule measuring 6 mm (image 55/7) Small nodules also present at the LEFT lung base 6 mm nodule on image 90/7 and a 5 mm nodule on image 94/7. Upper Abdomen: Signs of marked cirrhotic hepatic morphology. Liver not well assessed, studies essentially a noncontrast exam in the abdomen. Spleen is moderately enlarged and not well evaluated. Imaged portions of the pancreas, adrenal glands and kidneys are unremarkable without acute gastrointestinal process seen in the upper abdomen. Musculoskeletal: No acute bone finding. No destructive bone process. Spinal degenerative changes. Review of the MIP images confirms the above findings. IMPRESSION: 1. No sign of pulmonary embolism. 2. Posterior division inferior pulmonary vein on the RIGHT and inferior pulmonary vein showing lack of opacification. This extends to the orifice of the RIGHT inferior pulmonary vein and  perhaps even into the periphery of the LEFT atrium. This is moderately suspicious for thrombus made more suspicious by the normal opacification of other pulmonary venous branches in the chest. Consider cardiology consult confirmation with venous phase assessment of the chest is suggested. 3. Multiple areas of patchy ground-glass and discrete nodularity in the RIGHT upper lobe. Findings favor an infectious or inflammatory etiology. 4. Better defined lower lobe nodules largest at 6 mm. Given the multifocal nature of findings in the chest and concern for infection but some indeterminate more discrete nodular areas with suggest 8-12 week follow-up outside of the acute setting to ensure resolution. 5. Marked hepatic cirrhosis 6. Aortic atherosclerosis and coronary artery disease. Aortic Atherosclerosis (ICD10-I70.0). Critical Value/emergent results were called by telephone at the time of interpretation on 07/09/2022 at 12:20 pm to provider Eleisha [facility] , who verbally acknowledged these results. Electronically Signed: By: Zetta Bills M.D. On: 07/09/2022 12:20        Scheduled Meds:  aspirin EC  81 mg Oral Daily   atorvastatin  80 mg Oral Daily   fenofibrate  160 mg Oral Daily   fluticasone  1 spray Each Nare Daily   gabapentin  300 mg Oral QHS   insulin aspart  0-6 Units Subcutaneous TID WC   losartan  100 mg Oral Daily   metoprolol tartrate  50 mg Oral BID   PARoxetine  40 mg Oral Daily   pneumococcal 20-valent conjugate vaccine  0.5 mL Intramuscular Tomorrow-1000   QUEtiapine  50 mg Oral BID   Continuous Infusions:  azithromycin 500 mg (07/10/22 2227)   cefTRIAXone (ROCEPHIN)  IV 1 g (07/10/22 2102)   heparin 1,300 Units/hr (07/11/22 0431)     LOS: 3 days      Phillips Climes, MD [facility]   To contact the attending provider between 7A-7P or the covering provider during after hours 7P-7A, please log into the web site [email or web address] and access using universal [place]  password for that web site. If you do not have the password, please call the hospital operator.  07/11/2022, 10:38 AM

## 2022-07-11 NOTE — Progress Notes (Signed)
Rounding Note    Patient Name: Collin Bridges Date of Encounter: 07/11/2022  [place] Cardiologist: None   Subjective   Feels okay this morning. No chest pain or SOB.  Coronary CTA with multivessel CAD with FFR positive in m-LAD, mid-to-distal Lcx, prox-RCA. No evidence of pulmonary vein thrombus. Now planned for cath.  Inpatient Medications    Scheduled Meds:  aspirin EC  81 mg Oral Daily   atorvastatin  80 mg Oral Daily   fenofibrate  160 mg Oral Daily   fluticasone  1 spray Each Nare Daily   gabapentin  300 mg Oral QHS   [START ON 07/12/2022] hydrALAZINE  25 mg Oral Q8H   insulin aspart  0-6 Units Subcutaneous TID WC   metoprolol tartrate  50 mg Oral BID   PARoxetine  40 mg Oral Daily   pneumococcal 20-valent conjugate vaccine  0.5 mL Intramuscular Tomorrow-1000   QUEtiapine  50 mg Oral BID   Continuous Infusions:  sodium chloride 100 mL/hr at 07/11/22 1925   azithromycin Stopped (07/10/22 2330)   cefTRIAXone (ROCEPHIN)  IV Stopped (07/10/22 2132)   heparin 1,300 Units/hr (07/11/22 1925)   PRN Meds: acetaminophen **OR** acetaminophen, melatonin, ondansetron (ZOFRAN) IV, sodium chloride   Vital Signs    Vitals:   07/11/22 0055 07/11/22 0513 07/11/22 0717 07/11/22 1635  BP: 115/68 (!) 150/84 122/88 (!) 143/91  Pulse: 76 69 68 65  Resp: '18 18 18 18  '$ Temp: 97.9 F (36.6 C) 98 F (36.7 C) 97.7 F (36.5 C) 98 F (36.7 C)  TempSrc: Oral Oral Oral Oral  SpO2: 92% 92% 96% 95%  Weight:  82.8 kg    Height:        Intake/Output Summary (Last 24 hours) at 07/11/2022 2026 Last data filed at 07/11/2022 1925 Gross per 24 hour  Intake 2508.66 ml  Output 2075 ml  Net 433.66 ml       07/11/2022    5:13 AM 07/10/2022    4:52 AM 07/09/2022    5:46 PM  Last 3 Weights  Weight (lbs) 182 lb 8 oz 183 lb 14.4 oz 184 lb 3.2 oz  Weight (kg) 82.781 kg 83.416 kg 83.553 kg      Telemetry    NSR- Personally Reviewed  ECG    No new tracing today-  Personally Reviewed  Physical Exam   GEN: No acute distress. Comfortable  Neck: No JVD Cardiac: RRR, 2/6 harsh early peaking systolic murmur Respiratory: CTAB, no wheezes GI: Soft, nontender, non-distended  MS: No edema, warm Neuro:  Nonfocal  Psych: Normal affect   Labs    High Sensitivity Troponin:   Recent Labs  Lab 07/08/22 1811 07/08/22 2025 07/09/22 0456  TROPONINIHS 49* 675* 1,286*      Chemistry Recent Labs  Lab 07/08/22 1811 07/08/22 2025 07/09/22 0456 07/10/22 0742 07/11/22 0043  NA 135  --  140 140 137  K 3.9  --  3.6 4.5 4.0  CL 102  --  107 107 107  CO2 22  --  25 20* 19*  GLUCOSE 176*  --  138* 116* 127*  BUN 18  --  '16 12 15  '$ CREATININE 1.52*  --  1.33* 1.33* 1.46*  CALCIUM 9.4  --  8.8* 8.8* 8.5*  MG  --  2.1 2.1  --   --   PROT 6.5  --  5.8*  --   --   ALBUMIN 3.7  --  3.3*  --   --  AST 41  --  34  --   --   ALT 22  --  20  --   --   ALKPHOS 81  --  72  --   --   BILITOT 0.8  --  0.7  --   --   GFRNONAA 47*  --  55* 55* 49*  ANIONGAP 11  --  '8 13 11     '$ Lipids  Recent Labs  Lab 07/10/22 0742  CHOL 214*  TRIG 130  HDL 48  LDLCALC 140*  CHOLHDL 4.5     Hematology Recent Labs  Lab 07/09/22 0456 07/10/22 0742 07/11/22 0043  WBC 10.8* 8.7 7.5  RBC 3.95* 4.20* 3.97*  HGB 12.7* 13.3 12.6*  HCT 36.8* 38.3* 36.5*  MCV 93.2 91.2 91.9  MCH 32.2 31.7 31.7  MCHC 34.5 34.7 34.5  RDW 12.9 12.9 12.6  PLT 99* 110* 100*    Thyroid No results for input(s): "TSH", "FREET4" in the last 168 hours.  BNPNo results for input(s): "BNP", "PROBNP" in the last 168 hours.  DDimer  Recent Labs  Lab 07/09/22 2146  DDIMER 0.91*      Radiology    CT CORONARY MORPH W/CTA COR W/SCORE W/CA W/CM &/OR WO/CM  Addendum Date: 07/11/2022   ADDENDUM REPORT: 07/11/2022 16:23 EXAM: OVER-READ INTERPRETATION  CT CHEST The following report is an over-read performed by radiologist Dr. Fonnie [facility] on 07/11/2022. This over-read  does not include interpretation of cardiac or coronary anatomy or pathology. The interpretation by the cardiologist is attached. COMPARISON:  CT chest 07/11/2022 and 07/09/2022. FINDINGS: Scout view is unremarkable. Scattered patchy peribronchovascular ground-glass. Mild dependent atelectasis bilaterally. Pulmonary nodules measure up to 5 mm in the left lower lobe (12/32). Liver margin is irregular. IMPRESSION: 1. Peribronchovascular ground-glass, likely due to a mild bronchopneumonia. 2. 5 mm left lower lobe nodule. Please refer to diagnostic CT chest performed the same day for further details and recommendations. 3. Cirrhosis. Electronically Signed   By: Lorin Picket M.D.   On: 07/11/2022 16:23   Result Date: 07/11/2022 CLINICAL DATA:  77 year old with CVA and elevated troponin EXAM: Cardiac/Coronary  CTA TECHNIQUE: The patient was scanned on a [employer]. FINDINGS: A 120 kV prospective scan was triggered in the descending thoracic aorta at 111 HU's. Axial non-contrast 3 mm slices were carried out through the heart. The data set was analyzed on a dedicated work station and scored using the [place]. Gantry rotation speed was 250 msecs and collimation was .6 mm. 0.8 mg of sl NTG was given. The 3D data set was reconstructed in 5% intervals of the 67-82 % of the R-R cycle. Diastolic phases were analyzed on a dedicated work station using MPR, MIP and VRT modes. The patient received 80 cc of contrast. Image quality: good Aorta:  Normal size.  Aortic atherosclerosis.  No dissection. Aortic Valve: Dense  calcifications. Coronary Arteries:  Normal coronary origin.  Right dominance. RCA is a large dominant artery that gives rise to PDA and PLA. Proximal vessel is moderately stenosed 50-69% prior to dense calcified plaque that contains severe stenosis 70-99%. Mid to distal vessel is moderately diffusely diseased. PDA is small and poorly filled. Left main is a large artery that gives rise to LAD and  LCX arteries. There is mixed calcified and non calcified distal LM stenosis 0-24% LAD is a large vessel. There is ostial moderate stenosis 50-69%, proximal calcified severe stenosis 70-99% prior to large D1. Mid  vessel with high risk low attenuation plaque diffuse. Remainder of LAD wraps around apex. -D1 - large vessel, proximal calcified stenosis moderate to severe. -D2 - small caliber, not well filled. LCX is a non-dominant artery that gives rise to one large OM1 branch. Proximal mixed plaque 30-49% mild. Mid AV groove 99% stenosis. -OM1 - large branch with severe proximal vessel mixed plaque, 70-99% Other findings: Normal pulmonary vein drainage into the left atrium. No evidence of PV thrombosis. Normal left atrial appendage without a thrombus. Normal size of the pulmonary artery. Please see radiology report for non cardiac findings. IMPRESSION: 1. Coronary calcium score of 1963. This was 9 percentile for age and sex matched control. 2.  Normal coronary origin with right dominance. 3. Severe proximal LAD, proximal large OM1, and proximal RCA disease (70-99% stenosis). FFR compatible with multivessel flow limiting disease. 4. Aortic valve calcium score 3505: Compatible with severe aortic stenosis. Echocardiogram 06/02/22 showed mild aortic stenosis. 5.  Aortic atherosclerosis. 6.  No evidence of pulmonary vein thrombosis. Electronically Signed: By: Candee Furbish M.D. On: 07/11/2022 15:54   CT CORONARY FRACTIONAL FLOW RESERVE FLUID ANALYSIS  Result Date: 07/11/2022 EXAM: FFRCT ANALYSIS FINDINGS: FFRct analysis was performed on the original cardiac CT angiogram dataset. Diagrammatic representation of the FFRct analysis is provided in a separate PDF document in PACS. This dictation was created using the PDF document and an interactive 3D model of the results. 3D model is not available in the EMR/PACS. Normal FFR range is >0.80. 1. Left Main: Normal 2. LAD: Proximal 0.88 after ostial lesion, mid 0.71 abnormal 3.  LCX: Proximal OM 0.98, mid 0.80, distal 0.72. AV groove mid 0.70, distal 0.56 4. Ramus: N/a 5. RCA: Proximal 0.77, mid 0.62, distal not modeled. IMPRESSION: 1. Severe flow limiting CAD in LAD, LCX, RCA. Discussed with primary team. Note: These examples are not recommendations of [employer] and only provided as examples of what other customers are doing. Electronically Signed   By: Candee Furbish M.D.   On: 07/11/2022 16:01   CT Chest W Contrast  Result Date: 07/11/2022 CLINICAL DATA:  Chest pain. EXAM: CT CHEST WITH CONTRAST TECHNIQUE: Multidetector CT imaging of the chest was performed during intravenous contrast administration. RADIATION DOSE REDUCTION: This exam was performed according to the departmental dose-optimization program which includes automated exposure control, adjustment of the mA and/or kV according to patient size and/or use of iterative reconstruction technique. CONTRAST:  11m OMNIPAQUE IOHEXOL 350 MG/ML SOLN COMPARISON:  July 09, 2022. FINDINGS: Cardiovascular: Atherosclerosis of thoracic aorta is noted without aneurysm or dissection. Normal cardiac size. No pericardial effusion. Extensive coronary artery calcifications are noted suggesting coronary artery disease. Normal opacification of the right inferior pulmonary vein is noted; no definite thrombus is noted. Mediastinum/Nodes: No enlarged mediastinal, hilar, or axillary lymph nodes. Thyroid gland, trachea, and esophagus demonstrate no significant findings. Lungs/Pleura: No pneumothorax or pleural effusion is noted. Right upper lobe airspace opacity is noted concerning for pneumonia as described on prior exam. Minimal bibasilar subsegmental atelectasis is noted. Stable 5 mm nodule is noted in left lower lobe best seen on image number 95 of series 4. Upper Abdomen: Hepatic cirrhosis. Musculoskeletal: No chest wall abnormality. No acute or significant osseous findings. IMPRESSION: Stable right upper lobe airspace opacity is noted  concerning for pneumonia. Stable 5 mm nodule is noted in left lower lobe. Although likely benign, if the patient is high-risk, given the morphology and/or location of this nodule a non-contrast chest CT can be considered in 12 months.This recommendation  follows the consensus statement: Guidelines for Management of Incidental Pulmonary Nodules Detected on CT Images: From the Fleischner Society 2017; Radiology 2017; 284:228-243. Coronary artery calcifications are noted suggesting coronary artery disease. Hepatic cirrhosis. Aortic Atherosclerosis (ICD10-I70.0). Electronically Signed   By: Marijo Conception M.D.   On: 07/11/2022 13:46   VAS US CAROTID  Result Date: 07/11/2022 Carotid Arterial Duplex Study Patient Name:  Virgel Manifold  Date of Exam:   07/11/2022 Medical Rec #: [id number]              Accession #:    [id number] Date of Birth: [birth year]             Patient Gender: M Patient Age:   77 years Exam Location:  [facility] Procedure:      VAS US CAROTID Referring Phys: Egbert Garibaldi --------------------------------------------------------------------------------  Indications:       CVA and CHF, CKD. Risk Factors:      Hypertension, hyperlipidemia, Diabetes. Comparison Study:  No prior study. Performing Technologist: McKayla Maag RVT, [place]  Examination Guidelines: A complete evaluation includes B-mode imaging, spectral Doppler, color Doppler, and power Doppler as needed of all accessible portions of each vessel. Bilateral testing is considered an integral part of a complete examination. Limited examinations for reoccurring indications may be performed as noted.  Right Carotid Findings: +----------+-------+-------+--------+------------------------+-----------------+           PSV    EDV    StenosisPlaque Description      Comments                    cm/s   cm/s                                                      +----------+-------+-------+--------+------------------------+-----------------+ CCA Prox  60     8                                      intimal                                                                   thickening        +----------+-------+-------+--------+------------------------+-----------------+ CCA Distal50     9                                      intimal                                                                   thickening        +----------+-------+-------+--------+------------------------+-----------------+ ICA Prox  57     9      1-39%   irregular and  heterogenous                              +----------+-------+-------+--------+------------------------+-----------------+ ICA Mid   63     19                                                       +----------+-------+-------+--------+------------------------+-----------------+ ICA Distal85     18                                                       +----------+-------+-------+--------+------------------------+-----------------+ ECA       118    13                                                       +----------+-------+-------+--------+------------------------+-----------------+ +----------+--------+-------+----------------+-------------------+           PSV cm/sEDV cmsDescribe        Arm Pressure (mmHG) +----------+--------+-------+----------------+-------------------+ [id number]            Multiphasic, WNL                    +----------+--------+-------+----------------+-------------------+ +---------+--------+--+--------+--+---------+ VertebralPSV cm/s47EDV cm/s13Antegrade +---------+--------+--+--------+--+---------+  Left Carotid Findings: +----------+-------+-------+--------+------------------------+-----------------+           PSV    EDV    StenosisPlaque Description      Comments                     cm/s   cm/s                                                     +----------+-------+-------+--------+------------------------+-----------------+ CCA Prox  108    22                                                       +----------+-------+-------+--------+------------------------+-----------------+ CCA Distal70     16                                     intimal                                                                   thickening        +----------+-------+-------+--------+------------------------+-----------------+ ICA Prox  107    25     1-39%   irregular and  heterogenous                              +----------+-------+-------+--------+------------------------+-----------------+ ICA Mid   96     26                                                       +----------+-------+-------+--------+------------------------+-----------------+ ICA Distal140    38                                                       +----------+-------+-------+--------+------------------------+-----------------+ ECA       115    13                                                       +----------+-------+-------+--------+------------------------+-----------------+ +----------+--------+--------+----------------+-------------------+           PSV cm/sEDV cm/sDescribe        Arm Pressure (mmHG) +----------+--------+--------+----------------+-------------------+ [id number]             Multiphasic, WNL                    +----------+--------+--------+----------------+-------------------+ +---------+--------+--+--------+--+---------+ VertebralPSV cm/s44EDV cm/s12Antegrade +---------+--------+--+--------+--+---------+   Summary: Right Carotid: Velocities in the right ICA are consistent with a 1-39% stenosis. Left Carotid: Velocities in the left ICA are consistent with a 1-39% stenosis.  Vertebrals:  Bilateral vertebral arteries demonstrate antegrade flow. Subclavians: Normal flow hemodynamics were seen in bilateral subclavian              arteries. *See table(s) above for measurements and observations.  Electronically signed by Antony Contras MD on 07/11/2022 at 12:59:42 PM.    Final    MR ANGIO HEAD WO CONTRAST  Result Date: 07/10/2022 CLINICAL DATA:  Stroke follow-up EXAM: MRA HEAD WITHOUT CONTRAST TECHNIQUE: Angiographic images of the Circle of Willis were acquired using MRA technique without intravenous contrast. COMPARISON:  None Available. FINDINGS: POSTERIOR CIRCULATION: --Vertebral arteries: Normal --Inferior cerebellar arteries: Normal. --Basilar artery: Normal. --Superior cerebellar arteries: Normal. --Posterior cerebral arteries: Unchanged appearance of the diminutive right PCA ANTERIOR CIRCULATION: --Intracranial internal carotid arteries: Normal. --Anterior cerebral arteries (ACA): Unchanged posteriorly projecting focus from the anterior communicating artery measuring 1 x 2 mm. --Middle cerebral arteries (MCA): Normal. IMPRESSION: 1. No emergent large vessel occlusion or high-grade stenosis. 2. Unchanged appearance of the diminutive right PCA. 3. 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 Electronically Signed   By: Ulyses Jarred M.D.   On: 07/10/2022 03:42    Cardiac Studies    Coronary CTA 07/11/22: FINDINGS: A 120 kV prospective scan was triggered in the descending thoracic aorta at 111 HU's. Axial non-contrast 3 mm slices were carried out through the heart. The data set was analyzed on a dedicated work station and scored using the [place]. Gantry rotation speed was 250 msecs and collimation was .6 mm. 0.8 mg of sl NTG was given. The 3D data set was reconstructed in 5% intervals of the 67-82 % of  the R-R cycle. Diastolic phases were analyzed on a dedicated work station using MPR, MIP and VRT modes. The patient received 80 cc of contrast.    Image quality: good   Aorta:  Normal size.  Aortic atherosclerosis.  No dissection.   Aortic Valve: Dense  calcifications.   Coronary Arteries:  Normal coronary origin.  Right dominance.   RCA is a large dominant artery that gives rise to PDA and PLA. Proximal vessel is moderately stenosed 50-69% prior to dense calcified plaque that contains severe stenosis 70-99%. Mid to distal vessel is moderately diffusely diseased. PDA is small and poorly filled.   Left main is a large artery that gives rise to LAD and LCX arteries. There is mixed calcified and non calcified distal LM stenosis 0-24%   LAD is a large vessel. There is ostial moderate stenosis 50-69%, proximal calcified severe stenosis 70-99% prior to large D1. Mid vessel with high risk low attenuation plaque diffuse. Remainder of LAD wraps around apex.   -D1 - large vessel, proximal calcified stenosis moderate to severe.   -D2 - small caliber, not well filled.   LCX is a non-dominant artery that gives rise to one large OM1 branch. Proximal mixed plaque 30-49% mild. Mid AV groove 99% stenosis.   -OM1 - large branch with severe proximal vessel mixed plaque, 70-99%   Other findings:   Normal pulmonary vein drainage into the left atrium. No evidence of PV thrombosis.   Normal left atrial appendage without a thrombus.   Normal size of the pulmonary artery.   Please see radiology report for non cardiac findings.   IMPRESSION: 1. Coronary calcium score of 1963. This was 76 percentile for age and sex matched control.   2.  Normal coronary origin with right dominance.   3. Severe proximal LAD, proximal large OM1, and proximal RCA disease (70-99% stenosis). FFR compatible with multivessel flow limiting disease.   4. Aortic valve calcium score 3505: Compatible with severe aortic stenosis. Echocardiogram 06/02/22 showed mild aortic stenosis.   5.  Aortic atherosclerosis.   6.  No evidence of pulmonary vein  thrombosis.   ECHO: 07/09/2022 Sonographer Comments: Image acquisition challenging due to respiratory  motion.  IMPRESSIONS   1. Left ventricular ejection fraction, by estimation, is 60 to 65%. The left ventricle has normal function. The left ventricle demonstrates regional wall motion abnormalities (see scoring diagram/findings for description).  The basal inferior segment is hypokinetic.Left ventricular diastolic parameters are consistent with Grade I diastolic dysfunction (impaired relaxation).   2. Right ventricular systolic function is hyperdynamic. The right  ventricular size is mildly enlarged.   3. Calcified subchordal appatatus and previously describev. The mitral valve is abnormal. Trivial mitral valve regurgitation. No evidence of mitral stenosis. Moderate mitral annular calcification.   4. The aortic valve is abnormal. Aortic valve regurgitation is not  visualized. Mild aortic valve stenosis.   5. The pulmonary veins are not sufficiently imaged to evalution for pulmonary vein thrombus based off of this study.   CTA Chest 07/09/22: IMPRESSION: 1. No sign of pulmonary embolism. 2. Posterior division inferior pulmonary vein on the RIGHT and inferior pulmonary vein showing lack of opacification. This extends to the orifice of the RIGHT inferior pulmonary vein and perhaps even into the periphery of the LEFT atrium. This is moderately suspicious for thrombus made more suspicious by the normal opacification of other pulmonary venous branches in the chest. Consider cardiology consult confirmation with venous phase assessment of the chest is suggested. 3. Multiple areas  of patchy ground-glass and discrete nodularity in the RIGHT upper lobe. Findings favor an infectious or inflammatory etiology. 4. Better defined lower lobe nodules largest at 6 mm. Given the multifocal nature of findings in the chest and concern for infection but some indeterminate more discrete nodular areas with  suggest 8-12 week follow-up outside of the acute setting to ensure resolution. 5. Marked hepatic cirrhosis 6. Aortic atherosclerosis and coronary artery disease.     Patient Profile     77 y.o. male with a hx of CVA, DM2, hypertension, hyperlipidemia, hep C, and CKD IIIA who presented to the ER with severe sepsis from CAP with course complicated by chest pain and elevated troponin for which Cardiology has been consulted.   Assessment & Plan    #NSTEMI: #Multivessel CAD Patient presented with chest pressure in the setting of community acquired PNA. Trop [phone]. ECG with no acute ischemic changes. TTE with normal BiV function and no significant WMA. Coronary CTA with severe multivessel disease. Now planned for cath, -Plan for LHC today -Continue heparin gtt  -Continue ASA '81mg'$  daily, plavix '75mg'$  daily -Continue lipitor '40mg'$  daily -Hold losartan given plans for cath -Change metop to coreg 12.'5mg'$  BID  #Community Acquired PNA: Patient presented with fevers, rigors, chest discomfort and SOB found to have community acquired PNA. Now on ABX per primary. Feeling significantly better with improvement of symptoms. -Continue CTX  #CVA: MRI brain with small recurrent acute or subacute infarcts that are lacunar in nature likely related to chronic small disease. -Continue ASA, plavix and lipitor as above -Management per Neuro  #HTN: -Change metop to coreg -Holding losartan prior to cath  #Mild AS: -Continue serial monitoring as outpatient  #HLD: -Continue lipitor as above  #DMII: -Management per primary       For questions or updates, please contact [place] Please consult [email or web address] for contact info under        Signed, Freada Bergeron, MD  07/11/2022, 8:26 PM

## 2022-07-11 NOTE — Evaluation (Signed)
Physical Therapy Evaluation Patient Details Name: Collin Bridges MRN: [id number] DOB: Mar 12, 1946 Today's Date: 07/11/2022  History of Present Illness  Collin Bridges is a 77 y.o. male with medical history significant for chronic diastolic heart failure, mild aortic stenosis, CKD 3 ACEs with baseline creatinine 1.3-1.5, type 2 diabetes mellitus, hypertension, hyperlipidemia, who is admitted to [facility] on 07/08/2022 with severe sepsis due to community-acquired pneumonia after presenting from home to [facility] ED complaining of fever, chill  Clinical Impression  Pt tolerated treatment well today. Pt was able to ambulate in hallway independently and present at baseline function from a mobility standpoint. Pt has no further acute PT needs and PT is signing off. Pt could benefit from continued ambulation with mobility specialist. Reconsult PT if mobility status changes.       Recommendations for follow up therapy are one component of a multi-disciplinary discharge planning process, led by the attending physician.  Recommendations may be updated based on patient status, additional functional criteria and insurance authorization.  Follow Up Recommendations No PT follow up      Assistance Recommended at Discharge PRN  Patient can return home with the following  Help with stairs or ramp for entrance    Equipment Recommendations None recommended by PT  Recommendations for Other Services       Functional Status Assessment Patient has had a recent decline in their functional status and demonstrates the ability to make significant improvements in function in a reasonable and predictable amount of time.     Precautions / Restrictions Precautions Precautions: Fall Restrictions Weight Bearing Restrictions: No      Mobility  Bed Mobility Overal bed mobility: Independent                  Transfers Overall transfer level: Independent Equipment used: None                     Ambulation/Gait Ambulation/Gait assistance: Independent Gait Distance (Feet): 500 Feet Assistive device: None Gait Pattern/deviations: WFL(Within Functional Limits) Gait velocity: decreased     General Gait Details: No LOB noted. Pt states he feels at his baseline  Financial trader Rankin (Stroke Patients Only)       Balance Overall balance assessment: No apparent balance deficits (not formally assessed)                                           Pertinent Vitals/Pain Pain Assessment Pain Assessment: No/denies pain    Home Living Family/patient expects to be discharged to:: Private residence Living Arrangements: Alone Available Help at Discharge: Friend(s);Available PRN/intermittently Type of Home: House Home Access: Stairs to enter Entrance Stairs-Rails: Right;Left (Cant reach both) Entrance Stairs-Number of Steps: 5   Home Layout: One level Home Equipment: Grab bars - tub/shower (Walking stick)      Prior Function Prior Level of Function : Independent/Modified Independent             Mobility Comments: no AD       Hand Dominance   Dominant Hand: Right    Extremity/Trunk Assessment   Upper Extremity Assessment Upper Extremity Assessment: Overall WFL for tasks assessed    Lower Extremity Assessment Lower Extremity Assessment: Overall WFL for tasks assessed       Communication  Communication: No difficulties  Cognition Arousal/Alertness: Awake/alert Behavior During Therapy: WFL for tasks assessed/performed Overall Cognitive Status: Within Functional Limits for tasks assessed                                          General Comments General comments (skin integrity, edema, etc.): Pt in NAD    Exercises     Assessment/Plan    PT Assessment Patient does not need any further PT services  PT Problem List         PT Treatment Interventions      PT  Goals (Current goals can be found in the Care Plan section)       Frequency       Co-evaluation               AM-PAC PT "6 Clicks" Mobility  Outcome Measure Help needed turning from your back to your side while in a flat bed without using bedrails?: None Help needed moving from lying on your back to sitting on the side of a flat bed without using bedrails?: None Help needed moving to and from a bed to a chair (including a wheelchair)?: None Help needed standing up from a chair using your arms (e.g., wheelchair or bedside chair)?: None Help needed to walk in hospital room?: None Help needed climbing 3-5 steps with a railing? : A Little 6 Click Score: 23    End of Session Equipment Utilized During Treatment: Gait belt Activity Tolerance: Patient tolerated treatment well Patient left: in bed;with call bell/phone within reach (Seated EOB) Nurse Communication: Mobility status PT Visit Diagnosis: Other abnormalities of gait and mobility (R26.89)    Time: [id number] PT Time Calculation (min) (ACUTE ONLY): 18 min   Charges:   PT Evaluation $PT Eval Low Complexity: 1 Low PT Treatments $Gait Training: 8-22 mins        Shelby Mattocks, PT, DPT Acute Rehab Services [id number]   Viann Shove 07/11/2022, 3:56 PM

## 2022-07-11 NOTE — Progress Notes (Signed)
Mobility Specialist - Progress Note   07/11/22 1100  Mobility  Activity Ambulated with assistance in hallway  Level of Assistance Standby assist, set-up cues, supervision of patient - no hands on  Assistive Device Other (Comment) (IV Pole)  Distance Ambulated (ft) 200 ft  Activity Response Tolerated well  Mobility Referral Yes  $Mobility charge 1 Mobility    Pt received sitting EOB and agreeable. No complaints throughout walk. Left sitting EOB w/ all needs met.   [place] Specialist Please contact via SecureChat or Rehab office at [phone]

## 2022-07-11 NOTE — TOC Progression Note (Signed)
Transition of Care [facility]) - Progression Note    Patient Details  Name: Collin Bridges MRN: [id number] Date of Birth: 1946/01/17  Transition of Care [facility]) CM/SW Contact  Zenon Mayo, RN Phone Number: 07/11/2022, 1:44 PM  Clinical Narrative:    from home with wife, CAP, for cardiac ct today to rule out embolic CVA, conts on hep drip and iv abx.  TOC following.        Expected Discharge Plan and Services                                               Social Determinants of Health (SDOH) Interventions SDOH Screenings   Food Insecurity: No Food Insecurity (07/09/2022)  Housing: Low Risk  (07/09/2022)  Transportation Needs: No Transportation Needs (07/09/2022)  Utilities: Not At Risk (07/09/2022)  Depression (PHQ2-9): Low Risk  (06/03/2022)  Tobacco Use: Medium Risk (07/08/2022)    Readmission Risk Interventions     No data to display

## 2022-07-12 ENCOUNTER — Encounter (HOSPITAL_COMMUNITY)
Admission: EM | Disposition: A | Discharge status: Home / Self Care | Payer: Self-pay | Source: Home / Self Care | Attending: Internal Medicine

## 2022-07-12 ENCOUNTER — Telehealth: Payer: Self-pay

## 2022-07-12 DIAGNOSIS — I214 Non-ST elevation (NSTEMI) myocardial infarction: Secondary | ICD-10-CM

## 2022-07-12 DIAGNOSIS — R079 Chest pain, unspecified: Secondary | ICD-10-CM

## 2022-07-12 DIAGNOSIS — I2510 Atherosclerotic heart disease of native coronary artery without angina pectoris: Secondary | ICD-10-CM | POA: Diagnosis not present

## 2022-07-12 DIAGNOSIS — I6381 Other cerebral infarction due to occlusion or stenosis of small artery: Secondary | ICD-10-CM | POA: Diagnosis not present

## 2022-07-12 DIAGNOSIS — I350 Nonrheumatic aortic (valve) stenosis: Secondary | ICD-10-CM | POA: Diagnosis not present

## 2022-07-12 DIAGNOSIS — J189 Pneumonia, unspecified organism: Secondary | ICD-10-CM | POA: Diagnosis not present

## 2022-07-12 DIAGNOSIS — R931 Abnormal findings on diagnostic imaging of heart and coronary circulation: Secondary | ICD-10-CM

## 2022-07-12 DIAGNOSIS — I639 Cerebral infarction, unspecified: Secondary | ICD-10-CM | POA: Diagnosis not present

## 2022-07-12 HISTORY — PX: LEFT HEART CATH AND CORONARY ANGIOGRAPHY: CATH118249

## 2022-07-12 LAB — GLUCOSE, CAPILLARY
Glucose-Capillary: 101 mg/dL — ABNORMAL HIGH (ref 70–99)
Glucose-Capillary: 104 mg/dL — ABNORMAL HIGH (ref 70–99)
Glucose-Capillary: 118 mg/dL — ABNORMAL HIGH (ref 70–99)
Glucose-Capillary: 109 mg/dL — ABNORMAL HIGH (ref 70–99)
Glucose-Capillary: 92 mg/dL (ref 70–99)
Glucose-Capillary: 214 mg/dL — ABNORMAL HIGH (ref 70–99)

## 2022-07-12 LAB — CBC
HCT: 35.1 % — ABNORMAL LOW (ref 39.0–52.0)
Hemoglobin: 11.8 g/dL — ABNORMAL LOW (ref 13.0–17.0)
MCH: 30.9 pg (ref 26.0–34.0)
MCHC: 33.6 g/dL (ref 30.0–36.0)
MCV: 91.9 fL (ref 80.0–100.0)
Platelets: 102 10*3/uL — ABNORMAL LOW (ref 150–400)
RBC: 3.82 MIL/uL — ABNORMAL LOW (ref 4.22–5.81)
RDW: 12.7 % (ref 11.5–15.5)
WBC: 4.8 10*3/uL (ref 4.0–10.5)
nRBC: 0 % (ref 0.0–0.2)

## 2022-07-12 LAB — BASIC METABOLIC PANEL
Anion gap: 9 (ref 5–15)
BUN: 18 mg/dL (ref 8–23)
CO2: 22 mmol/L (ref 22–32)
Calcium: 8.5 mg/dL — ABNORMAL LOW (ref 8.9–10.3)
Chloride: 107 mmol/L (ref 98–111)
Creatinine, Ser: 1.33 mg/dL — ABNORMAL HIGH (ref 0.61–1.24)
GFR, Estimated: 55 mL/min — ABNORMAL LOW (ref >60)
Glucose, Bld: 118 mg/dL — ABNORMAL HIGH (ref 70–99)
Potassium: 3.8 mmol/L (ref 3.5–5.1)
Sodium: 138 mmol/L (ref 135–145)

## 2022-07-12 LAB — HEPARIN LEVEL (UNFRACTIONATED): Heparin Unfractionated: 0.42 IU/mL (ref 0.30–0.70)

## 2022-07-12 SURGERY — LEFT HEART CATH AND CORONARY ANGIOGRAPHY
Anesthesia: LOCAL

## 2022-07-12 MED ORDER — HEPARIN (PORCINE) IN NACL 1000-0.9 UT/500ML-% IV SOLN
INTRAVENOUS | Status: DC | PRN
  Administered 2022-07-12 (×2): 500 mL

## 2022-07-12 MED ORDER — HEPARIN (PORCINE) 25000 UT/250ML-% IV SOLN
1300.0000 [IU]/h | INTRAVENOUS | Status: DC
  Administered 2022-07-12: 1300 [IU]/h via INTRAVENOUS
  Filled 2022-07-12: qty 250

## 2022-07-12 MED ORDER — SODIUM CHLORIDE 0.9 % IV SOLN: INTRAVENOUS | Status: AC

## 2022-07-12 MED ORDER — SODIUM CHLORIDE 0.9% FLUSH
3.0000 mL | INTRAVENOUS | Status: DC | PRN
  Administered 2022-07-12: 3 mL via INTRAVENOUS

## 2022-07-12 MED ORDER — SODIUM CHLORIDE 0.9 % WEIGHT BASED INFUSION
3.0000 mL/kg/h | INTRAVENOUS | Status: AC
  Administered 2022-07-12: 3 mL/kg/h via INTRAVENOUS

## 2022-07-12 MED ORDER — MIDAZOLAM HCL 2 MG/2ML IJ SOLN
INTRAMUSCULAR | Status: DC | PRN
  Administered 2022-07-12: .5 mg via INTRAVENOUS

## 2022-07-12 MED ORDER — HEPARIN SODIUM (PORCINE) 1000 UNIT/ML IJ SOLN
INTRAMUSCULAR | Status: AC
  Filled 2022-07-12: qty 10

## 2022-07-12 MED ORDER — LABETALOL HCL 5 MG/ML IV SOLN: 10.0000 mg | INTRAVENOUS | Status: AC | PRN

## 2022-07-12 MED ORDER — SODIUM CHLORIDE 0.9% FLUSH
3.0000 mL | Freq: Two times a day (BID) | INTRAVENOUS | Status: DC
  Administered 2022-07-12: 3 mL via INTRAVENOUS

## 2022-07-12 MED ORDER — LIDOCAINE HCL (PF) 1 % IJ SOLN
INTRAMUSCULAR | Status: AC
  Filled 2022-07-12: qty 30

## 2022-07-12 MED ORDER — LIDOCAINE HCL (PF) 1 % IJ SOLN
INTRAMUSCULAR | Status: DC | PRN
  Administered 2022-07-12: 4 mL

## 2022-07-12 MED ORDER — CARVEDILOL 12.5 MG PO TABS
12.5000 mg | ORAL_TABLET | Freq: Two times a day (BID) | ORAL | Status: DC
  Administered 2022-07-12 – 2022-07-13 (×2): 12.5 mg via ORAL
  Filled 2022-07-12 (×2): qty 1

## 2022-07-12 MED ORDER — MIDAZOLAM HCL 2 MG/2ML IJ SOLN
INTRAMUSCULAR | Status: AC
  Filled 2022-07-12: qty 2

## 2022-07-12 MED ORDER — SODIUM CHLORIDE 0.9 % WEIGHT BASED INFUSION
1.0000 mL/kg/h | INTRAVENOUS | Status: DC
  Administered 2022-07-12: 1 mL/kg/h via INTRAVENOUS

## 2022-07-12 MED ORDER — SODIUM CHLORIDE 0.9% FLUSH: 3.0000 mL | INTRAVENOUS | Status: DC | PRN

## 2022-07-12 MED ORDER — FENTANYL CITRATE (PF) 100 MCG/2ML IJ SOLN
INTRAMUSCULAR | Status: AC
  Filled 2022-07-12: qty 2

## 2022-07-12 MED ORDER — SODIUM CHLORIDE 0.9 % WEIGHT BASED INFUSION: 1.0000 mL/kg/h | INTRAVENOUS | Status: DC

## 2022-07-12 MED ORDER — HYDRALAZINE HCL 20 MG/ML IJ SOLN: 10.0000 mg | INTRAMUSCULAR | Status: AC | PRN

## 2022-07-12 MED ORDER — SODIUM CHLORIDE 0.9 % IV SOLN: 250.0000 mL | INTRAVENOUS | Status: DC | PRN

## 2022-07-12 MED ORDER — FENTANYL CITRATE (PF) 100 MCG/2ML IJ SOLN
INTRAMUSCULAR | Status: DC | PRN
  Administered 2022-07-12: 12.5 ug via INTRAVENOUS

## 2022-07-12 MED ORDER — HEPARIN SODIUM (PORCINE) 1000 UNIT/ML IJ SOLN
INTRAMUSCULAR | Status: DC | PRN
  Administered 2022-07-12: 4000 [IU] via INTRAVENOUS

## 2022-07-12 MED ORDER — VERAPAMIL HCL 2.5 MG/ML IV SOLN
INTRAVENOUS | Status: DC | PRN
  Administered 2022-07-12: 10 mL via INTRA_ARTERIAL

## 2022-07-12 MED ORDER — IOHEXOL 350 MG/ML SOLN
INTRAVENOUS | Status: DC | PRN
  Administered 2022-07-12: 10 mL

## 2022-07-12 MED ORDER — SODIUM CHLORIDE 0.9% FLUSH
3.0000 mL | Freq: Two times a day (BID) | INTRAVENOUS | Status: DC
  Administered 2022-07-12 – 2022-07-13 (×2): 3 mL via INTRAVENOUS

## 2022-07-12 MED ORDER — VERAPAMIL HCL 2.5 MG/ML IV SOLN
INTRAVENOUS | Status: AC
  Filled 2022-07-12: qty 2

## 2022-07-12 MED ORDER — SODIUM CHLORIDE 0.9 % WEIGHT BASED INFUSION: 3.0000 mL/kg/h | INTRAVENOUS | Status: DC

## 2022-07-12 SURGICAL SUPPLY — 9 items
BAND ZEPHYR COMPRESS 30 LONG (HEMOSTASIS) IMPLANT
CATH OPTITORQUE TIG 4.0 5F (CATHETERS) IMPLANT
GLIDESHEATH SLEND SS 6F .021 (SHEATH) IMPLANT
GUIDEWIRE INQWIRE 1.5J.035X260 (WIRE) IMPLANT
INQWIRE 1.5J .035X260CM (WIRE) ×1
KIT HEART LEFT (KITS) ×1 IMPLANT
PACK CARDIAC CATHETERIZATION (CUSTOM PROCEDURE TRAY) ×1 IMPLANT
TRANSDUCER W/STOPCOCK (MISCELLANEOUS) ×1 IMPLANT
TUBING CIL FLEX 10 FLL-RA (TUBING) ×1 IMPLANT

## 2022-07-12 NOTE — Progress Notes (Signed)
ANTICOAGULATION CONSULT NOTE - Follow Up Consult  Pharmacy Consult for Heparin Indication:  CVA/ possible right inferior pulmonary vein thrombus  No Known Allergies  Patient Measurements: Height: 6' (182.9 cm) Weight: 83.2 kg (183 lb 6.8 oz) IBW/kg (Calculated) : 77.6 Heparin Dosing Weight: 82.8 kg  Vital Signs: Temp: 97.6 F (36.4 C) (02/27 0738) Temp Source: Oral (02/27 0738) BP: 138/97 (02/27 0738) Pulse Rate: 63 (02/27 0738)  Labs: Recent Labs    07/10/22 0742 07/11/22 0043 07/12/22 0048  HGB 13.3 12.6* 11.8*  HCT 38.3* 36.5* 35.1*  PLT 110* 100* 102*  HEPARINUNFRC 0.50 0.46 0.42  CREATININE 1.33* 1.46* 1.33*     Estimated Creatinine Clearance: 51.1 mL/min (A) (by C-G formula based on SCr of 1.33 mg/dL (H)).  Assessment: 77 yo male admitted with CVA, CTA negative for PE, but suspicious for pulmonary vein thrombosis. No anticoagulation prior to admission, was taking Aspirin 81 mg and Plavix. MRI shows scattered bilateral strokes, etiology likely small vessel disease per Neuro. Pharmacy consulted to dose heparin.  No boluses, low-therapeutic goal.  Heparin level remains therapeutic (0.40) on 1300 units/hr. Platelet count low stable. Hgb stable. Bloody nose reported 2/25 pm. Patient stated due to dryness and resolved. Has Saline nasal spray for prn use.  Plavix was stopped after 2/24 dose. Aspirin 81 mg daily continues.  Coronary CTA showed no evidence of pulmonary vein thrombosis but multvessel CAD. For cardiac cath today.   Goal of Therapy:  Heparin level 0.3-0.5 units/ml Monitor platelets by anticoagulation protocol: Yes   Plan:  Continue heparin drip at 1300 units/hr Daily heparin level and CBC Monitor for signs/symptoms of bleeding. Follow up post-cath for anticoagulation plans.  Arty Baumgartner, RPh 07/12/2022,10:28 AM

## 2022-07-12 NOTE — Plan of Care (Signed)
Problem: Education: Goal: Ability to describe self-care measures that may prevent or decrease complications (Diabetes Survival Skills Education) will improve Outcome: Progressing Goal: Individualized Educational Video(s) Outcome: Progressing   Problem: Coping: Goal: Ability to adjust to condition or change in health will improve Outcome: Progressing   Problem: Fluid Volume: Goal: Ability to maintain a balanced intake and output will improve Outcome: Progressing   Problem: Health Behavior/Discharge Planning: Goal: Ability to identify and utilize available resources and services will improve Outcome: Progressing Goal: Ability to manage health-related needs will improve Outcome: Progressing   Problem: Metabolic: Goal: Ability to maintain appropriate glucose levels will improve Outcome: Progressing   Problem: Nutritional: Goal: Maintenance of adequate nutrition will improve Outcome: Progressing Goal: Progress toward achieving an optimal weight will improve Outcome: Progressing   Problem: Skin Integrity: Goal: Risk for impaired skin integrity will decrease Outcome: Progressing   Problem: Tissue Perfusion: Goal: Adequacy of tissue perfusion will improve Outcome: Progressing   Problem: Health Behavior/Discharge Planning: Goal: Ability to manage health-related needs will improve Outcome: Progressing   Problem: Clinical Measurements: Goal: Ability to maintain clinical measurements within normal limits will improve Outcome: Progressing Goal: Will remain free from infection Outcome: Progressing Goal: Diagnostic test results will improve Outcome: Progressing Goal: Respiratory complications will improve Outcome: Progressing Goal: Cardiovascular complication will be avoided Outcome: Progressing   Problem: Activity: Goal: Risk for activity intolerance will decrease Outcome: Progressing   Problem: Nutrition: Goal: Adequate nutrition will be maintained Outcome:  Progressing   Problem: Coping: Goal: Level of anxiety will decrease Outcome: Progressing   Problem: Elimination: Goal: Will not experience complications related to bowel motility Outcome: Progressing Goal: Will not experience complications related to urinary retention Outcome: Progressing   Problem: Pain Managment: Goal: General experience of comfort will improve Outcome: Progressing   Problem: Safety: Goal: Ability to remain free from injury will improve Outcome: Progressing   Problem: Skin Integrity: Goal: Risk for impaired skin integrity will decrease Outcome: Progressing   Problem: Education: Goal: Knowledge of General Education information will improve Description: Including pain rating scale, medication(s)/side effects and non-pharmacologic comfort measures Outcome: Progressing   Problem: Health Behavior/Discharge Planning: Goal: Ability to manage health-related needs will improve Outcome: Progressing   Problem: Clinical Measurements: Goal: Ability to maintain clinical measurements within normal limits will improve Outcome: Progressing Goal: Will remain free from infection Outcome: Progressing Goal: Diagnostic test results will improve Outcome: Progressing Goal: Respiratory complications will improve Outcome: Progressing Goal: Cardiovascular complication will be avoided Outcome: Progressing   Problem: Activity: Goal: Risk for activity intolerance will decrease Outcome: Progressing   Problem: Nutrition: Goal: Adequate nutrition will be maintained Outcome: Progressing   Problem: Coping: Goal: Level of anxiety will decrease Outcome: Progressing   Problem: Elimination: Goal: Will not experience complications related to bowel motility Outcome: Progressing Goal: Will not experience complications related to urinary retention Outcome: Progressing   Problem: Pain Managment: Goal: General experience of comfort will improve Outcome: Progressing   Problem:  Safety: Goal: Ability to remain free from injury will improve Outcome: Progressing   Problem: Skin Integrity: Goal: Risk for impaired skin integrity will decrease Outcome: Progressing   Problem: Education: Goal: Understanding of CV disease, CV risk reduction, and recovery process will improve Outcome: Progressing Goal: Individualized Educational Video(s) Outcome: Progressing   Problem: Activity: Goal: Ability to return to baseline activity level will improve Outcome: Progressing   Problem: Cardiovascular: Goal: Ability to achieve and maintain adequate cardiovascular perfusion will improve Outcome: Progressing Goal: Vascular access site(s) Level 0-1 will be maintained  Outcome: Progressing   

## 2022-07-12 NOTE — Progress Notes (Signed)
Mobility Specialist Progress Note:   07/12/22 1545  Mobility  Activity Ambulated with assistance in hallway  Level of Assistance Standby assist, set-up cues, supervision of patient - no hands on  Assistive Device Other (Comment) (IV Pole)  Distance Ambulated (ft) 400 ft  Activity Response Tolerated well  Mobility Referral Yes  $Mobility charge 1 Mobility   Pt agreeable to mobility session. Required no physical assistance throughout. Pt back in bed with all needs met.   Nelta Numbers Mobility Specialist Please contact via SecureChat or  Rehab office at [phone]

## 2022-07-12 NOTE — Transitions of Care (Post Inpatient/ED Visit) (Unsigned)
   07/12/2022  Name: Collin Bridges MRN: [id number] DOB: 08/24/1945  Today's TOC FU Call Status: Today's TOC FU Call Status:: Unsuccessul Call (1st Attempt) Unsuccessful Call (1st Attempt) Date: 07/12/22  Attempted to reach the patient regarding the most recent Inpatient/ED visit.  Follow Up Plan: Additional outreach attempts will be made to reach the patient to complete the Transitions of Care (Post Inpatient/ED visit) call.   [place] LPN [place] Advisor Direct Dial [phone]

## 2022-07-12 NOTE — Progress Notes (Signed)
ANTICOAGULATION CONSULT NOTE - Follow Up Consult  Pharmacy Consult for Heparin Indication:  CVA/ possible right inferior pulmonary vein thrombus  No Known Allergies  Patient Measurements: Height: 6' (182.9 cm) Weight: 83.2 kg (183 lb 6.8 oz) IBW/kg (Calculated) : 77.6 Heparin Dosing Weight: 82.8 kg  Vital Signs: Temp: 97.9 F (36.6 C) (02/27 1930) Temp Source: Oral (02/27 1930) BP: 164/74 (02/27 1930) Pulse Rate: 66 (02/27 1930)  Labs: Recent Labs    07/10/22 0742 07/11/22 0043 07/12/22 0048  HGB 13.3 12.6* 11.8*  HCT 38.3* 36.5* 35.1*  PLT 110* 100* 102*  HEPARINUNFRC 0.50 0.46 0.42  CREATININE 1.33* 1.46* 1.33*     Estimated Creatinine Clearance: 51.1 mL/min (A) (by C-G formula based on SCr of 1.33 mg/dL (H)).  Assessment: 77 yo male admitted with CVA, CTA negative for PE, but suspicious for pulmonary vein thrombosis. No anticoagulation prior to admission, was taking Aspirin 81 mg and Plavix. MRI shows scattered bilateral strokes, etiology likely small vessel disease per Neuro. Pharmacy consulted to dose heparin.  No boluses, low-therapeutic goal.  Heparin to resume post/cath 2h after radial band removal (~1900 per RN).   Goal of Therapy:  Heparin level 0.3-0.5 units/ml Monitor platelets by anticoagulation protocol: Yes   Plan:  Resume heparin 1300 units/h no bolus at 2100 Recheck heparin level with am labs  Arrie Senate, PharmD, BCPS, [facility] Clinical Pharmacist [phone] Please check AMION for all [facility] Pharmacy numbers 07/12/2022

## 2022-07-12 NOTE — Progress Notes (Signed)
STROKE TEAM PROGRESS NOTE   INTERVAL HISTORY: No one is at the bedside.  Patient continues to do well.  He has no complaints today.  Vital signs are stable.  Neurological exam is unchanged. He is n.p.o. for coronary CTA later today per cardiology.  He remains on heparin ggt. cardiology has seen him and do not feel he needs a TEE  Vitals:   07/12/22 0121 07/12/22 0125 07/12/22 0408 07/12/22 0738  BP: (!) 141/69 (!) 141/69 (!) 117/58 (!) 138/97  Pulse: 67  70 63  Resp: '18  18 18  '$ Temp:   97.7 F (36.5 C) 97.6 F (36.4 C)  TempSrc: Oral  Oral Oral  SpO2:      Weight: 83.2 kg     Height:       CBC:  Recent Labs  Lab 07/09/22 0456 07/10/22 0742 07/11/22 0043 07/12/22 0048  WBC 10.8*   < > 7.5 4.8  NEUTROABS 8.1*  --   --   --   HGB 12.7*   < > 12.6* 11.8*  HCT 36.8*   < > 36.5* 35.1*  MCV 93.2   < > 91.9 91.9  PLT 99*   < > 100* 102*   < > = values in this interval not displayed.   Basic Metabolic Panel:  Recent Labs  Lab 07/08/22 2025 07/09/22 0456 07/10/22 0742 07/11/22 0043 07/12/22 0048  NA  --  140   < > 137 138  K  --  3.6   < > 4.0 3.8  CL  --  107   < > 107 107  CO2  --  25   < > 19* 22  GLUCOSE  --  138*   < > 127* 118*  BUN  --  16   < > 15 18  CREATININE  --  1.33*   < > 1.46* 1.33*  CALCIUM  --  8.8*   < > 8.5* 8.5*  MG 2.1 2.1  --   --   --    < > = values in this interval not displayed.   Lipid Panel:  Recent Labs  Lab 07/10/22 0742  CHOL 214*  TRIG 130  HDL 48  CHOLHDL 4.5  VLDL 26  LDLCALC 140*   HgbA1c:  Recent Labs  Lab 07/09/22 2146  HGBA1C 6.3*   Urine Drug Screen: No results for input(s): "LABOPIA", "COCAINSCRNUR", "LABBENZ", "AMPHETMU", "THCU", "LABBARB" in the last 168 hours.  Alcohol Level No results for input(s): "ETH" in the last 168 hours.  IMAGING past 24 hours CT CORONARY MORPH W/CTA COR W/SCORE W/CA W/CM &/OR WO/CM  Addendum Date: 07/11/2022   ADDENDUM REPORT: 07/11/2022 16:23 EXAM: OVER-READ INTERPRETATION  CT CHEST  The following report is an over-read performed by radiologist Dr. Fonnie Birkenhead [facility] on 07/11/2022. This over-read does not include interpretation of cardiac or coronary anatomy or pathology. The interpretation by the cardiologist is attached. COMPARISON:  CT chest 07/11/2022 and 07/09/2022. FINDINGS: Scout view is unremarkable. Scattered patchy peribronchovascular ground-glass. Mild dependent atelectasis bilaterally. Pulmonary nodules measure up to 5 mm in the left lower lobe (12/32). Liver margin is irregular. IMPRESSION: 1. Peribronchovascular ground-glass, likely due to a mild bronchopneumonia. 2. 5 mm left lower lobe nodule. Please refer to diagnostic CT chest performed the same day for further details and recommendations. 3. Cirrhosis. Electronically Signed   By: Lorin Picket M.D.   On: 07/11/2022 16:23   Result Date: 07/11/2022 CLINICAL DATA:  77 year old with  CVA and elevated troponin EXAM: Cardiac/Coronary  CTA TECHNIQUE: The patient was scanned on a [employer]. FINDINGS: A 120 kV prospective scan was triggered in the descending thoracic aorta at 111 HU's. Axial non-contrast 3 mm slices were carried out through the heart. The data set was analyzed on a dedicated work station and scored using the [place]. Gantry rotation speed was 250 msecs and collimation was .6 mm. 0.8 mg of sl NTG was given. The 3D data set was reconstructed in 5% intervals of the 67-82 % of the R-R cycle. Diastolic phases were analyzed on a dedicated work station using MPR, MIP and VRT modes. The patient received 80 cc of contrast. Image quality: good Aorta:  Normal size.  Aortic atherosclerosis.  No dissection. Aortic Valve: Dense  calcifications. Coronary Arteries:  Normal coronary origin.  Right dominance. RCA is a large dominant artery that gives rise to PDA and PLA. Proximal vessel is moderately stenosed 50-69% prior to dense calcified plaque that contains severe stenosis 70-99%. Mid to  distal vessel is moderately diffusely diseased. PDA is small and poorly filled. Left main is a large artery that gives rise to LAD and LCX arteries. There is mixed calcified and non calcified distal LM stenosis 0-24% LAD is a large vessel. There is ostial moderate stenosis 50-69%, proximal calcified severe stenosis 70-99% prior to large D1. Mid vessel with high risk low attenuation plaque diffuse. Remainder of LAD wraps around apex. -D1 - large vessel, proximal calcified stenosis moderate to severe. -D2 - small caliber, not well filled. LCX is a non-dominant artery that gives rise to one large OM1 branch. Proximal mixed plaque 30-49% mild. Mid AV groove 99% stenosis. -OM1 - large branch with severe proximal vessel mixed plaque, 70-99% Other findings: Normal pulmonary vein drainage into the left atrium. No evidence of PV thrombosis. Normal left atrial appendage without a thrombus. Normal size of the pulmonary artery. Please see radiology report for non cardiac findings. IMPRESSION: 1. Coronary calcium score of 1963. This was 56 percentile for age and sex matched control. 2.  Normal coronary origin with right dominance. 3. Severe proximal LAD, proximal large OM1, and proximal RCA disease (70-99% stenosis). FFR compatible with multivessel flow limiting disease. 4. Aortic valve calcium score 3505: Compatible with severe aortic stenosis. Echocardiogram 06/02/22 showed mild aortic stenosis. 5.  Aortic atherosclerosis. 6.  No evidence of pulmonary vein thrombosis. Electronically Signed: By: Candee Furbish M.D. On: 07/11/2022 15:54   CT CORONARY FRACTIONAL FLOW RESERVE FLUID ANALYSIS  Result Date: 07/11/2022 EXAM: FFRCT ANALYSIS FINDINGS: FFRct analysis was performed on the original cardiac CT angiogram dataset. Diagrammatic representation of the FFRct analysis is provided in a separate PDF document in PACS. This dictation was created using the PDF document and an interactive 3D model of the results. 3D model is not  available in the EMR/PACS. Normal FFR range is >0.80. 1. Left Main: Normal 2. LAD: Proximal 0.88 after ostial lesion, mid 0.71 abnormal 3. LCX: Proximal OM 0.98, mid 0.80, distal 0.72. AV groove mid 0.70, distal 0.56 4. Ramus: N/a 5. RCA: Proximal 0.77, mid 0.62, distal not modeled. IMPRESSION: 1. Severe flow limiting CAD in LAD, LCX, RCA. Discussed with primary team. Note: These examples are not recommendations of [employer] and only provided as examples of what other customers are doing. Electronically Signed   By: Candee Furbish M.D.   On: 07/11/2022 16:01   CT Chest W Contrast  Result Date: 07/11/2022 CLINICAL DATA:  Chest pain. EXAM: CT CHEST WITH  CONTRAST TECHNIQUE: Multidetector CT imaging of the chest was performed during intravenous contrast administration. RADIATION DOSE REDUCTION: This exam was performed according to the departmental dose-optimization program which includes automated exposure control, adjustment of the mA and/or kV according to patient size and/or use of iterative reconstruction technique. CONTRAST:  154m OMNIPAQUE IOHEXOL 350 MG/ML SOLN COMPARISON:  July 09, 2022. FINDINGS: Cardiovascular: Atherosclerosis of thoracic aorta is noted without aneurysm or dissection. Normal cardiac size. No pericardial effusion. Extensive coronary artery calcifications are noted suggesting coronary artery disease. Normal opacification of the right inferior pulmonary vein is noted; no definite thrombus is noted. Mediastinum/Nodes: No enlarged mediastinal, hilar, or axillary lymph nodes. Thyroid gland, trachea, and esophagus demonstrate no significant findings. Lungs/Pleura: No pneumothorax or pleural effusion is noted. Right upper lobe airspace opacity is noted concerning for pneumonia as described on prior exam. Minimal bibasilar subsegmental atelectasis is noted. Stable 5 mm nodule is noted in left lower lobe best seen on image number 95 of series 4. Upper Abdomen: Hepatic cirrhosis. Musculoskeletal:  No chest wall abnormality. No acute or significant osseous findings. IMPRESSION: Stable right upper lobe airspace opacity is noted concerning for pneumonia. Stable 5 mm nodule is noted in left lower lobe. Although likely benign, if the patient is high-risk, given the morphology and/or location of this nodule a non-contrast chest CT can be considered in 12 months.This recommendation follows the consensus statement: Guidelines for Management of Incidental Pulmonary Nodules Detected on CT Images: From the Fleischner Society 2017; Radiology 2017; 284:228-243. Coronary artery calcifications are noted suggesting coronary artery disease. Hepatic cirrhosis. Aortic Atherosclerosis (ICD10-I70.0). Electronically Signed   By: JMarijo ConceptionM.D.   On: 07/11/2022 13:46   VAS [place]  Result Date: 07/11/2022 Carotid Arterial Duplex Study Patient Name:  JVirgel Manifold Date of Exam:   07/11/2022 Medical Rec #: [id number]             Accession #:    [id number] of Birth: 104-25-1947            Patient Gender: M Patient Age:   89years [facility] [place] Referring Phys: GEgbert Garibaldi--------------------------------------------------------------------------------  Indications:       CVA and CHF, CKD. Risk Factors:      Hypertension, hyperlipidemia, Diabetes. Comparison Study:  No prior study. Performing Technologist: McKayla Maag RVT, [place]  Examination Guidelines: A complete evaluation includes B-mode imaging, spectral Doppler, color Doppler, and power Doppler as needed of all accessible portions of each vessel. Bilateral testing is considered an integral part of a complete examination. Limited examinations for reoccurring indications may be performed as noted.  Right Carotid Findings: +----------+-------+-------+--------+------------------------+-----------------+           PSV    EDV    StenosisPlaque Description      Comments                    cm/s   cm/s                                                      +----------+-------+-------+--------+------------------------+-----------------+ CCA Prox  60     8  intimal                                                                   thickening        +----------+-------+-------+--------+------------------------+-----------------+ CCA Distal50     9                                      intimal                                                                   thickening        +----------+-------+-------+--------+------------------------+-----------------+ ICA Prox  57     9      1-39%   irregular and                                                             heterogenous                              +----------+-------+-------+--------+------------------------+-----------------+ ICA Mid   63     19                                                       +----------+-------+-------+--------+------------------------+-----------------+ ICA Distal85     18                                                       +----------+-------+-------+--------+------------------------+-----------------+ ECA       118    13                                                       +----------+-------+-------+--------+------------------------+-----------------+ +----------+--------+-------+----------------+-------------------+           PSV cm/sEDV cmsDescribe        Arm Pressure (mmHG) +----------+--------+-------+----------------+-------------------+ [id number]            Multiphasic, WNL                    +----------+--------+-------+----------------+-------------------+ +---------+--------+--+--------+--+---------+ VertebralPSV cm/s47EDV cm/s13Antegrade +---------+--------+--+--------+--+---------+  Left Carotid Findings: +----------+-------+-------+--------+------------------------+-----------------+           PSV     EDV    StenosisPlaque Description      Comments  cm/s   cm/s                                                     +----------+-------+-------+--------+------------------------+-----------------+ CCA Prox  108    22                                                       +----------+-------+-------+--------+------------------------+-----------------+ CCA Distal70     16                                     intimal                                                                   thickening        +----------+-------+-------+--------+------------------------+-----------------+ ICA Prox  107    25     1-39%   irregular and                                                             heterogenous                              +----------+-------+-------+--------+------------------------+-----------------+ ICA Mid   96     26                                                       +----------+-------+-------+--------+------------------------+-----------------+ ICA Distal140    38                                                       +----------+-------+-------+--------+------------------------+-----------------+ ECA       115    13                                                       +----------+-------+-------+--------+------------------------+-----------------+ +----------+--------+--------+----------------+-------------------+           PSV cm/sEDV cm/sDescribe        Arm Pressure (mmHG) +----------+--------+--------+----------------+-------------------+ [id number]             Multiphasic, WNL                    +----------+--------+--------+----------------+-------------------+ +---------+--------+--+--------+--+---------+  VertebralPSV cm/s44EDV cm/s12Antegrade +---------+--------+--+--------+--+---------+   Summary: Right Carotid: Velocities in the right ICA are consistent with a 1-39% stenosis. Left Carotid:  Velocities in the left ICA are consistent with a 1-39% stenosis. Vertebrals:  Bilateral vertebral arteries demonstrate antegrade flow. Subclavians: Normal flow hemodynamics were seen in bilateral subclavian              arteries. *See table(s) above for measurements and observations.  Electronically signed by Antony Contras MD on 07/11/2022 at 12:59:42 PM.    Final     PHYSICAL EXAM General - well nourished, well developed pleasant elderly Caucasian male, in no apparent distress.     Ophthalmologic - fundi not visualized due to noncooperation.     Cardiovascular - regular rhythm and rate   Mental Status -  Alert. A/O x 4.    Cranial Nerves II - XII - II - Vision intact OU. III, IV, VI - Extraocular movements intact. V - Facial sensation intact bilaterally. VII - Facial movement intact bilaterally. VIII - Hearing & vestibular intact bilaterally. X - Palate elevates symmetrically. XI - Chin turning & shoulder shrug intact bilaterally. XII - Tongue protrusion intact.   Motor Strength - [name] was normal in all extremities and pronator drift was absent.    Motor Tone & Bulk - Muscle tone was assessed at the neck and appendages and was normal.  Bulk was normal and fasciculations were absent.   Sensory - normal to LT.   Coordination - no ataxia.    Gait and Station - deferred  ASSESSMENT/PLAN Collin Bridges is a 77 y.o. male history of TIA being followed by Collin Bridges outpatient, heart failure admitted initially for possible sepsis with pneumonia found to have pulmonary  vein decreased flow ? thrombus on CTA cardiology consulted.  MRI shows scattered bilateral subcortical strokes etiology likely small vessel disease.  Doubt cardioembolic  MR angio: [id number] ACA aneurysm-unchanged.   MRI : scattered strokes in both hemispheres mostly subcortical and periventricular. 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  Carotid Doppler bilateral 1-39% carotid stenosis.   2D Echo  60-65%. Calcified mitral valve.  Marland Kitchen LDL 140 goal   HgbA1c 6.3 VTE prophylaxis - on heparin ggt.     Diet   Diet NPO time specified   Plavix prior to admission, now on heparin IV.  Therapy recommendations:  pending. Disposition:  pending.  Hypertension Permissive hypertension (OK if < 220/120) but gradually normalize in 5-7 days Long-term BP goal normotensive  Hyperlipidemia Home meds:  lipitor '40mg'$ , resumed in hospital LDL 140, goal < 70 High intensity statin now on '80mg'$  lipitor. Continue statin at discharge  Diabetes type II Controlled  HgbA1c 6.3, goal < 7.0 CBGs Recent Labs    07/12/22 0557 07/12/22 0818 07/12/22 0904  GLUCAP 101* 104* 118*    SSI  Other Stroke Risk Factors Advanced Age >/= 27  Hospital day # 4   Patient presented with bilateral subcortical infarcts likely from small vessel disease.  CT angiogram suggests abnormal flow in the pulmonary vein raising question of thrombus.  Continue IV heparin for now till coronary CTA is done and pulmonary vein thrombosis is confirmed or ruled out.  Discussed with patient and Dr. Emeline Gins.  Greater than 50% time during this 25-minute visit was spent in counseling and coordination of care and discussion with patient and care team and answering questions about his strokes and abnormal brain MRI and CT angiogram findings.  Antony Contras, MD Medical Director [facility] Pager:  [phone] 07/12/2022 10:35 AM   To contact Stroke Continuity provider, please refer to [email or web address] After hours, contact General Neurology

## 2022-07-12 NOTE — Interval H&P Note (Signed)
History and Physical Interval Note:  07/12/2022 4:27 PM  Collin Bridges  has presented today for surgery, with the diagnosis of nstemi.  The various methods of treatment have been discussed with the patient and family. After consideration of risks, benefits and other options for treatment, the patient has consented to  Procedure(s): LEFT HEART CATH AND CORONARY ANGIOGRAPHY (N/A) PERCUTANEOUS CORONARY INTERVENTION    as a surgical intervention.  The patient's history has been reviewed, patient examined, no change in status, stable for surgery.  I have reviewed the patient's chart and labs.  Questions were answered to the patient's satisfaction.    Cath Lab Visit (complete for each Cath Lab visit)  Clinical Evaluation Leading to the Procedure:   ACS: Yes.    Non-ACS:    Anginal Classification: CCS IV  Anti-ischemic medical therapy: Minimal Therapy (1 class of medications)  Non-Invasive Test Results: High-risk stress test findings: cardiac mortality >3%/year  Prior CABG: No previous CABG    Glenetta Hew

## 2022-07-12 NOTE — Progress Notes (Signed)
PROGRESS NOTE    Collin Bridges  [id number] DOB: 11/07/1945 DOA: 07/08/2022 PCP: [name], MD   Chief Complaint  Patient presents with   Chest Pain   Fever   flu symptoms    Brief Narrative:   Collin Bridges is a 77 y.o. male with medical history significant for chronic diastolic heart failure, mild aortic stenosis, CKD 3 ACEs with baseline creatinine 1.3-1.5, type 2 diabetes mellitus, hypertension, hyperlipidemia, who presents to ED secondary to complaints of fever, chills, flu symptoms and chest pain, imaging significant for Pneumonia, he was started on empiric antibiotic coverage, troponins keep trending up, he does endorse pleuritic chest pain, so CTA chest was obtained which was negative for PE, but significant for lack of opacification and pulmonary veins extending into the left atrium suspicious for pulmonary vein thrombosis, patient had MRI brain done as an outpatient 07/08/2022 which was significant for  CVA.  As well patient significantly elevated troponins with concern of NSTEMI patient was started on heparin GTT, seen by cardiology, CT angio significant for three-vessel disease, but no evidence of pulmonary vein thrombosis, seen by neurology, no concern for embolic source of his stroke, plan for cardiac cath today.  Assessment & Plan:   Principal Problem:   CAP (community acquired pneumonia) Active Problems:   Essential hypertension   Mild aortic stenosis   Hyperlipidemia   Stage 3a chronic kidney disease (CKD) (HCC)   DM2 (diabetes mellitus, type 2) (HCC)   Severe sepsis (HCC)   Chest pain   Elevated troponin   Depression   Allergic rhinitis   Chronic diastolic CHF (congestive heart failure) (HCC)   NSTEMI (non-ST elevated myocardial infarction) ([place])     NSTEMI -2D echo with a preserved EF but with regional wall motion abnormality -On heparin gtt. due to above -Continue with continue with aspirin, statin, fenofibrate, Cozaar,  started on beta-blockers as well -Cardiology input greatly appreciated, CT coronary significant for three-vessel disease, plan for cardiac cath today  Acute CVA -MRI brain significant for acute/subacute multiple CVAs in both hemispheres, discussed with neurology, no concern of cardioembolic source, no indication for TEE at this point.  Sepsis secondary to pneumonia -continue with IV Rocephin and azithromycin -Blood cultures remain negative  Questionable pulmonary vein thrombosis(ruled out) -She will concern on CTA chest PE protocol, but this has been ruled out after CTA coronary yesterday.  History of aortic stenosis -Cardiology on board  Type 2 diabetes mellitus -A1c 6.3 last January -Continue with insulin sliding scale  Depression -continue with Paxil  Hyperlipidemia -DL is elevated at 142, will increase his Lipitor to 80 mg.  Hypertension -Blood pressure acceptable, continue with losartan  Pulm nodules -Will need repeat imaging in 8 to 12 weeks  Hepatic cirrhosis Thrombocytopenia -Is followed by Dr. Candis Schatz, known history of hep C treated with interferon in 1999   DVT prophylaxis: Heparin GTT Code Status: Full Family Communication: none at bedside Disposition:   Status is: Inpatient    Consultants:  Cardiology Neurology   Subjective:  No significant events overnight, he denies any complaints today.  Objective: Vitals:   07/12/22 0121 07/12/22 0125 07/12/22 0408 07/12/22 0738  BP: (!) 141/69 (!) 141/69 (!) 117/58 (!) 138/97  Pulse: 67  70 63  Resp: '18  18 18  '$ Temp:   97.7 F (36.5 C) 97.6 F (36.4 C)  TempSrc: Oral  Oral Oral  SpO2:      Weight: 83.2 kg     Height:  Intake/Output Summary (Last 24 hours) at 07/12/2022 1110 Last data filed at 07/12/2022 0730 Gross per 24 hour  Intake 2424.88 ml  Output 1525 ml  Net 899.88 ml   Filed Weights   07/10/22 0452 07/11/22 0513 07/12/22 0121  Weight: 83.4 kg 82.8 kg 83.2 kg     Examination:  Awake Alert, Oriented X 3, No new F.N deficits, Normal affect Symmetrical Chest wall movement, Good air movement bilaterally, CTAB RRR,No Gallops,Rubs or new Murmurs, No Parasternal Heave +ve B.Sounds, Abd Soft, No tenderness, No rebound - guarding or rigidity. No Cyanosis, Clubbing or edema, No new Rash or bruise        Data Reviewed: I have personally reviewed following labs and imaging studies  CBC: Recent Labs  Lab 07/08/22 1811 07/09/22 0456 07/10/22 0742 07/11/22 0043 07/12/22 0048  WBC 16.6* 10.8* 8.7 7.5 4.8  NEUTROABS  --  8.1*  --   --   --   HGB 13.3 12.7* 13.3 12.6* 11.8*  HCT 38.7* 36.8* 38.3* 36.5* 35.1*  MCV 92.1 93.2 91.2 91.9 91.9  PLT 121* 99* 110* 100* 102*    Basic Metabolic Panel: Recent Labs  Lab 07/08/22 1811 07/08/22 2025 07/09/22 0456 07/10/22 0742 07/11/22 0043 07/12/22 0048  NA 135  --  140 140 137 138  K 3.9  --  3.6 4.5 4.0 3.8  CL 102  --  107 107 107 107  CO2 22  --  25 20* 19* 22  GLUCOSE 176*  --  138* 116* 127* 118*  BUN 18  --  '16 12 15 18  '$ CREATININE 1.52*  --  1.33* 1.33* 1.46* 1.33*  CALCIUM 9.4  --  8.8* 8.8* 8.5* 8.5*  MG  --  2.1 2.1  --   --   --     GFR: Estimated Creatinine Clearance: 51.1 mL/min (A) (by C-G formula based on SCr of 1.33 mg/dL (H)).  Liver Function Tests: Recent Labs  Lab 07/08/22 1811 07/09/22 0456  AST 41 34  ALT 22 20  ALKPHOS 81 72  BILITOT 0.8 0.7  PROT 6.5 5.8*  ALBUMIN 3.7 3.3*    CBG: Recent Labs  Lab 07/11/22 1633 07/11/22 2058 07/12/22 0557 07/12/22 0818 07/12/22 0904  GLUCAP 101* 138* 101* 104* 118*     Recent Results (from the past 240 hour(s))  Resp panel by RT-PCR (RSV, Flu A&B, Covid) Anterior Nasal Swab     Status: None   Collection Time: 07/08/22  6:08 PM   Specimen: Anterior Nasal Swab  Result Value Ref Range Status   SARS Coronavirus 2 by RT PCR NEGATIVE NEGATIVE Final   Influenza A by PCR NEGATIVE NEGATIVE Final   Influenza B by PCR  NEGATIVE NEGATIVE Final    Comment: (NOTE) The Xpert Xpress SARS-CoV-2/FLU/RSV plus assay is intended as an aid in the diagnosis of influenza from Nasopharyngeal swab specimens and should not be used as a sole basis for treatment. Nasal washings and aspirates are unacceptable for Xpert Xpress SARS-CoV-2/FLU/RSV testing.  Fact Sheet for Patients: EntrepreneurPulse.com.au  Fact Sheet for Healthcare Providers: IncredibleEmployment.be  This test is not yet approved or cleared by the [place] FDA and has been authorized for detection and/or diagnosis of SARS-CoV-2 by FDA under an Emergency Use Authorization (EUA). This EUA will remain in effect (meaning this test can be used) for the duration of the COVID-19 declaration under Section 564(b)(1) of the Act, 21 U.S.C. section 360bbb-3(b)(1), unless the authorization is terminated or revoked.  Resp Syncytial Virus by PCR NEGATIVE NEGATIVE Final    Comment: (NOTE) Fact Sheet for Patients: EntrepreneurPulse.com.au  Fact Sheet for Healthcare Providers: IncredibleEmployment.be  This test is not yet approved or cleared by the [place] FDA and has been authorized for detection and/or diagnosis of SARS-CoV-2 by FDA under an Emergency Use Authorization (EUA). This EUA will remain in effect (meaning this test can be used) for the duration of the COVID-19 declaration under Section 564(b)(1) of the Act, 21 U.S.C. section 360bbb-3(b)(1), unless the authorization is terminated or revoked.  Performed at [facility], [place] [address], [place], [place] [address]   Respiratory (~20 pathogens) panel by PCR     Status: None   Collection Time: 07/08/22  6:08 PM   Specimen: Nasopharyngeal Swab; Respiratory  Result Value Ref Range Status   Adenovirus NOT DETECTED NOT DETECTED Final   Coronavirus 229E NOT DETECTED NOT DETECTED Final    Comment: (NOTE) The  Coronavirus on the Respiratory Panel, DOES NOT test for the novel  Coronavirus (2019 nCoV)    Coronavirus HKU1 NOT DETECTED NOT DETECTED Final   Coronavirus NL63 NOT DETECTED NOT DETECTED Final   Coronavirus OC43 NOT DETECTED NOT DETECTED Final   Metapneumovirus NOT DETECTED NOT DETECTED Final   Rhinovirus / Enterovirus NOT DETECTED NOT DETECTED Final   Influenza A NOT DETECTED NOT DETECTED Final   Influenza B NOT DETECTED NOT DETECTED Final   Parainfluenza Virus 1 NOT DETECTED NOT DETECTED Final   Parainfluenza Virus 2 NOT DETECTED NOT DETECTED Final   Parainfluenza Virus 3 NOT DETECTED NOT DETECTED Final   Parainfluenza Virus 4 NOT DETECTED NOT DETECTED Final   Respiratory Syncytial Virus NOT DETECTED NOT DETECTED Final   Bordetella pertussis NOT DETECTED NOT DETECTED Final   Bordetella Parapertussis NOT DETECTED NOT DETECTED Final   Chlamydophila pneumoniae NOT DETECTED NOT DETECTED Final   Mycoplasma pneumoniae NOT DETECTED NOT DETECTED Final    Comment: Performed at [facility], [place]. [address], [place], [place] [address]  Culture, blood (routine x 2)     Status: None (Preliminary result)   Collection Time: 07/08/22 10:24 PM   Specimen: BLOOD  Result Value Ref Range Status   Specimen Description BLOOD BLOOD RIGHT HAND  Final   Special Requests   Final    BOTTLES DRAWN AEROBIC AND ANAEROBIC Blood Culture results may not be optimal due to an inadequate volume of blood received in culture bottles   Culture   Final    NO GROWTH 4 DAYS Performed at [facility], [place] [address] [place], [place] [address]    Report Status PENDING  Incomplete  Culture, blood (routine x 2)     Status: None (Preliminary result)   Collection Time: 07/08/22 11:15 PM   Specimen: BLOOD  Result Value Ref Range Status   Specimen Description BLOOD SITE NOT SPECIFIED  Final   Special Requests   Final    BOTTLES DRAWN AEROBIC AND ANAEROBIC Blood Culture results may not be optimal due to an  inadequate volume of blood received in culture bottles   Culture   Final    NO GROWTH 4 DAYS Performed at [facility], [place] [address] [place], [place] [address]    Report Status PENDING  Incomplete         Radiology Studies: CT CORONARY MORPH W/CTA COR W/SCORE W/CA W/CM &/OR WO/CM  Addendum Date: 07/11/2022   ADDENDUM REPORT: 07/11/2022 16:23 EXAM: OVER-READ INTERPRETATION  CT CHEST  The following report is an over-read performed by radiologist Dr. Fonnie Birkenhead [facility] on 07/11/2022. This over-read does not include interpretation of cardiac or coronary anatomy or pathology. The interpretation by the cardiologist is attached. COMPARISON:  CT chest 07/11/2022 and 07/09/2022. FINDINGS: Scout view is unremarkable. Scattered patchy peribronchovascular ground-glass. Mild dependent atelectasis bilaterally. Pulmonary nodules measure up to 5 mm in the left lower lobe (12/32). Liver margin is irregular. IMPRESSION: 1. Peribronchovascular ground-glass, likely due to a mild bronchopneumonia. 2. 5 mm left lower lobe nodule. Please refer to diagnostic CT chest performed the same day for further details and recommendations. 3. Cirrhosis. Electronically Signed   By: Lorin Picket M.D.   On: 07/11/2022 16:23   Result Date: 07/11/2022 CLINICAL DATA:  77 year old with CVA and elevated troponin EXAM: Cardiac/Coronary  CTA TECHNIQUE: The patient was scanned on a [employer]. FINDINGS: A 120 kV prospective scan was triggered in the descending thoracic aorta at 111 HU's. Axial non-contrast 3 mm slices were carried out through the heart. The data set was analyzed on a dedicated work station and scored using the [place]. Gantry rotation speed was 250 msecs and collimation was .6 mm. 0.8 mg of sl NTG was given. The 3D data set was reconstructed in 5% intervals of the 67-82 % of the R-R cycle. Diastolic phases were analyzed on a dedicated work station using MPR, MIP and VRT  modes. The patient received 80 cc of contrast. Image quality: good Aorta:  Normal size.  Aortic atherosclerosis.  No dissection. Aortic Valve: Dense  calcifications. Coronary Arteries:  Normal coronary origin.  Right dominance. RCA is a large dominant artery that gives rise to PDA and PLA. Proximal vessel is moderately stenosed 50-69% prior to dense calcified plaque that contains severe stenosis 70-99%. Mid to distal vessel is moderately diffusely diseased. PDA is small and poorly filled. Left main is a large artery that gives rise to LAD and LCX arteries. There is mixed calcified and non calcified distal LM stenosis 0-24% LAD is a large vessel. There is ostial moderate stenosis 50-69%, proximal calcified severe stenosis 70-99% prior to large D1. Mid vessel with high risk low attenuation plaque diffuse. Remainder of LAD wraps around apex. -D1 - large vessel, proximal calcified stenosis moderate to severe. -D2 - small caliber, not well filled. LCX is a non-dominant artery that gives rise to one large OM1 branch. Proximal mixed plaque 30-49% mild. Mid AV groove 99% stenosis. -OM1 - large branch with severe proximal vessel mixed plaque, 70-99% Other findings: Normal pulmonary vein drainage into the left atrium. No evidence of PV thrombosis. Normal left atrial appendage without a thrombus. Normal size of the pulmonary artery. Please see radiology report for non cardiac findings. IMPRESSION: 1. Coronary calcium score of 1963. This was 32 percentile for age and sex matched control. 2.  Normal coronary origin with right dominance. 3. Severe proximal LAD, proximal large OM1, and proximal RCA disease (70-99% stenosis). FFR compatible with multivessel flow limiting disease. 4. Aortic valve calcium score 3505: Compatible with severe aortic stenosis. Echocardiogram 06/02/22 showed mild aortic stenosis. 5.  Aortic atherosclerosis. 6.  No evidence of pulmonary vein thrombosis. Electronically Signed: By: Candee Furbish M.D. On:  07/11/2022 15:54   CT CORONARY FRACTIONAL FLOW RESERVE FLUID ANALYSIS  Result Date: 07/11/2022 EXAM: FFRCT ANALYSIS FINDINGS: FFRct analysis was performed on the original cardiac CT angiogram dataset. Diagrammatic representation of the FFRct analysis is provided in a separate PDF document in PACS. This dictation  was created using the PDF document and an interactive 3D model of the results. 3D model is not available in the EMR/PACS. Normal FFR range is >0.80. 1. Left Main: Normal 2. LAD: Proximal 0.88 after ostial lesion, mid 0.71 abnormal 3. LCX: Proximal OM 0.98, mid 0.80, distal 0.72. AV groove mid 0.70, distal 0.56 4. Ramus: N/a 5. RCA: Proximal 0.77, mid 0.62, distal not modeled. IMPRESSION: 1. Severe flow limiting CAD in LAD, LCX, RCA. Discussed with primary team. Note: These examples are not recommendations of [employer] and only provided as examples of what other customers are doing. Electronically Signed   By: Candee Furbish M.D.   On: 07/11/2022 16:01   CT Chest W Contrast  Result Date: 07/11/2022 CLINICAL DATA:  Chest pain. EXAM: CT CHEST WITH CONTRAST TECHNIQUE: Multidetector CT imaging of the chest was performed during intravenous contrast administration. RADIATION DOSE REDUCTION: This exam was performed according to the departmental dose-optimization program which includes automated exposure control, adjustment of the mA and/or kV according to patient size and/or use of iterative reconstruction technique. CONTRAST:  183m OMNIPAQUE IOHEXOL 350 MG/ML SOLN COMPARISON:  July 09, 2022. FINDINGS: Cardiovascular: Atherosclerosis of thoracic aorta is noted without aneurysm or dissection. Normal cardiac size. No pericardial effusion. Extensive coronary artery calcifications are noted suggesting coronary artery disease. Normal opacification of the right inferior pulmonary vein is noted; no definite thrombus is noted. Mediastinum/Nodes: No enlarged mediastinal, hilar, or axillary lymph nodes. Thyroid  gland, trachea, and esophagus demonstrate no significant findings. Lungs/Pleura: No pneumothorax or pleural effusion is noted. Right upper lobe airspace opacity is noted concerning for pneumonia as described on prior exam. Minimal bibasilar subsegmental atelectasis is noted. Stable 5 mm nodule is noted in left lower lobe best seen on image number 95 of series 4. Upper Abdomen: Hepatic cirrhosis. Musculoskeletal: No chest wall abnormality. No acute or significant osseous findings. IMPRESSION: Stable right upper lobe airspace opacity is noted concerning for pneumonia. Stable 5 mm nodule is noted in left lower lobe. Although likely benign, if the patient is high-risk, given the morphology and/or location of this nodule a non-contrast chest CT can be considered in 12 months.This recommendation follows the consensus statement: Guidelines for Management of Incidental Pulmonary Nodules Detected on CT Images: From the Fleischner Society 2017; Radiology 2017; 284:228-243. Coronary artery calcifications are noted suggesting coronary artery disease. Hepatic cirrhosis. Aortic Atherosclerosis (ICD10-I70.0). Electronically Signed   By: JMarijo ConceptionM.D.   On: 07/11/2022 13:46   VAS [place]  Result Date: 07/11/2022 Carotid Arterial Duplex Study Patient Name:  JVirgel Manifold Date of Exam:   07/11/2022 Medical Rec #: [id number]             Accession #:    [id number] of Birth: 1May 04, 1947            Patient Gender: M Patient Age:   89years [facility] [place] Referring Phys: GEgbert Garibaldi--------------------------------------------------------------------------------  Indications:       CVA and CHF, CKD. Risk Factors:      Hypertension, hyperlipidemia, Diabetes. Comparison Study:  No prior study. Performing Technologist: McKayla Maag RVT, [place]  Examination Guidelines: A complete evaluation includes B-mode imaging, spectral Doppler, color Doppler, and power Doppler  as needed of all accessible portions of each vessel. Bilateral testing is considered an integral part of a complete examination. Limited examinations for reoccurring indications may be performed as noted.  Right Carotid Findings: +----------+-------+-------+--------+------------------------+-----------------+  PSV    EDV    StenosisPlaque Description      Comments                    cm/s   cm/s                                                     +----------+-------+-------+--------+------------------------+-----------------+ CCA Prox  60     8                                      intimal                                                                   thickening        +----------+-------+-------+--------+------------------------+-----------------+ CCA Distal50     9                                      intimal                                                                   thickening        +----------+-------+-------+--------+------------------------+-----------------+ ICA Prox  57     9      1-39%   irregular and                                                             heterogenous                              +----------+-------+-------+--------+------------------------+-----------------+ ICA Mid   63     19                                                       +----------+-------+-------+--------+------------------------+-----------------+ ICA Distal85     18                                                       +----------+-------+-------+--------+------------------------+-----------------+ ECA       118    13                                                       +----------+-------+-------+--------+------------------------+-----------------+ +----------+--------+-------+----------------+-------------------+  PSV cm/sEDV cmsDescribe        Arm Pressure (mmHG)  +----------+--------+-------+----------------+-------------------+ [id number]            Multiphasic, WNL                    +----------+--------+-------+----------------+-------------------+ +---------+--------+--+--------+--+---------+ VertebralPSV cm/s47EDV cm/s13Antegrade +---------+--------+--+--------+--+---------+  Left Carotid Findings: +----------+-------+-------+--------+------------------------+-----------------+           PSV    EDV    StenosisPlaque Description      Comments                    cm/s   cm/s                                                     +----------+-------+-------+--------+------------------------+-----------------+ CCA Prox  108    22                                                       +----------+-------+-------+--------+------------------------+-----------------+ CCA Distal70     16                                     intimal                                                                   thickening        +----------+-------+-------+--------+------------------------+-----------------+ ICA Prox  107    25     1-39%   irregular and                                                             heterogenous                              +----------+-------+-------+--------+------------------------+-----------------+ ICA Mid   96     26                                                       +----------+-------+-------+--------+------------------------+-----------------+ ICA Distal140    38                                                       +----------+-------+-------+--------+------------------------+-----------------+ ECA       115    13                                                       +----------+-------+-------+--------+------------------------+-----------------+ +----------+--------+--------+----------------+-------------------+  PSV cm/sEDV cm/sDescribe        Arm Pressure  (mmHG) +----------+--------+--------+----------------+-------------------+ [id number]             Multiphasic, WNL                    +----------+--------+--------+----------------+-------------------+ +---------+--------+--+--------+--+---------+ VertebralPSV cm/s44EDV cm/s12Antegrade +---------+--------+--+--------+--+---------+   Summary: Right Carotid: Velocities in the right ICA are consistent with a 1-39% stenosis. Left Carotid: Velocities in the left ICA are consistent with a 1-39% stenosis. Vertebrals:  Bilateral vertebral arteries demonstrate antegrade flow. Subclavians: Normal flow hemodynamics were seen in bilateral subclavian              arteries. *See table(s) above for measurements and observations.  Electronically signed by Antony Contras MD on 07/11/2022 at 12:59:42 PM.    Final         Scheduled Meds:  aspirin EC  81 mg Oral Daily   atorvastatin  80 mg Oral Daily   carvedilol  12.5 mg Oral BID WC   fenofibrate  160 mg Oral Daily   fluticasone  1 spray Each Nare Daily   gabapentin  300 mg Oral QHS   hydrALAZINE  25 mg Oral Q8H   insulin aspart  0-6 Units Subcutaneous TID WC   PARoxetine  40 mg Oral Daily   pneumococcal 20-valent conjugate vaccine  0.5 mL Intramuscular Tomorrow-1000   QUEtiapine  50 mg Oral BID   Continuous Infusions:  azithromycin Stopped (07/11/22 2228)   cefTRIAXone (ROCEPHIN)  IV Stopped (07/11/22 2104)   heparin 1,300 Units/hr (07/12/22 0339)     LOS: 4 days      Phillips Climes, MD [facility]   To contact the attending provider between 7A-7P or the covering provider during after hours 7P-7A, please log into the web site [email or web address] and access using universal [place] password for that web site. If you do not have the password, please call the hospital operator.  07/12/2022, 11:10 AM

## 2022-07-13 ENCOUNTER — Encounter (HOSPITAL_COMMUNITY): Payer: Self-pay | Admitting: Cardiology

## 2022-07-13 DIAGNOSIS — I214 Non-ST elevation (NSTEMI) myocardial infarction: Secondary | ICD-10-CM | POA: Diagnosis not present

## 2022-07-13 DIAGNOSIS — I10 Essential (primary) hypertension: Secondary | ICD-10-CM | POA: Diagnosis not present

## 2022-07-13 DIAGNOSIS — I6300 Cerebral infarction due to thrombosis of unspecified precerebral artery: Secondary | ICD-10-CM

## 2022-07-13 LAB — BASIC METABOLIC PANEL
Anion gap: 7 (ref 5–15)
BUN: 17 mg/dL (ref 8–23)
CO2: 23 mmol/L (ref 22–32)
Calcium: 8.3 mg/dL — ABNORMAL LOW (ref 8.9–10.3)
Chloride: 107 mmol/L (ref 98–111)
Creatinine, Ser: 1.36 mg/dL — ABNORMAL HIGH (ref 0.61–1.24)
GFR, Estimated: 54 mL/min — ABNORMAL LOW (ref >60)
Glucose, Bld: 140 mg/dL — ABNORMAL HIGH (ref 70–99)
Potassium: 3.7 mmol/L (ref 3.5–5.1)
Sodium: 137 mmol/L (ref 135–145)

## 2022-07-13 LAB — HEPARIN LEVEL (UNFRACTIONATED)
Heparin Unfractionated: 0.17 IU/mL — ABNORMAL LOW (ref 0.30–0.70)
Heparin Unfractionated: 0.29 IU/mL — ABNORMAL LOW (ref 0.30–0.70)

## 2022-07-13 LAB — CBC
HCT: 34.6 % — ABNORMAL LOW (ref 39.0–52.0)
Hemoglobin: 11.9 g/dL — ABNORMAL LOW (ref 13.0–17.0)
MCH: 31.2 pg (ref 26.0–34.0)
MCHC: 34.4 g/dL (ref 30.0–36.0)
MCV: 90.8 fL (ref 80.0–100.0)
Platelets: 96 10*3/uL — ABNORMAL LOW (ref 150–400)
RBC: 3.81 MIL/uL — ABNORMAL LOW (ref 4.22–5.81)
RDW: 12.7 % (ref 11.5–15.5)
WBC: 4.1 10*3/uL (ref 4.0–10.5)
nRBC: 0 % (ref 0.0–0.2)

## 2022-07-13 LAB — GLUCOSE, CAPILLARY
Glucose-Capillary: 107 mg/dL — ABNORMAL HIGH (ref 70–99)
Glucose-Capillary: 134 mg/dL — ABNORMAL HIGH (ref 70–99)

## 2022-07-13 SURGERY — ECHOCARDIOGRAM, TRANSESOPHAGEAL
Anesthesia: Monitor Anesthesia Care

## 2022-07-13 MED ORDER — ATORVASTATIN CALCIUM 80 MG PO TABS: 80.0000 mg | ORAL_TABLET | Freq: Every day | ORAL | 2 refills | Status: DC

## 2022-07-13 MED ORDER — LOSARTAN POTASSIUM 50 MG PO TABS
100.0000 mg | ORAL_TABLET | Freq: Every day | ORAL | Status: DC
  Administered 2022-07-13: 100 mg via ORAL
  Filled 2022-07-13: qty 2

## 2022-07-13 MED ORDER — ASPIRIN 81 MG PO TBEC: 81.0000 mg | DELAYED_RELEASE_TABLET | Freq: Every day | ORAL | 2 refills | Status: AC

## 2022-07-13 MED ORDER — HYDRALAZINE HCL 25 MG PO TABS: 25.0000 mg | ORAL_TABLET | Freq: Three times a day (TID) | ORAL | 2 refills | Status: DC

## 2022-07-13 NOTE — Progress Notes (Signed)
Mobility Specialist - Progress Note   07/13/22 1000  Mobility  Activity Ambulated with assistance in hallway  Level of Assistance Standby assist, set-up cues, supervision of patient - no hands on  Assistive Device Other (Comment) (IV pole)  Distance Ambulated (ft) 600 ft  Activity Response Tolerated well  Mobility Referral Yes  $Mobility charge 1 Mobility    Pt received in bed and agreeable. No complaints on walk, no physical assistance needed. Left in bed w/ call bell at his side.   [place] Specialist Please contact via SecureChat or Rehab office at [phone]

## 2022-07-13 NOTE — Plan of Care (Signed)
Problem: Education: Goal: Ability to describe self-care measures that may prevent or decrease complications (Diabetes Survival Skills Education) will improve Outcome: Progressing Goal: Individualized Educational Video(s) Outcome: Progressing   Problem: Coping: Goal: Ability to adjust to condition or change in health will improve Outcome: Progressing   Problem: Fluid Volume: Goal: Ability to maintain a balanced intake and output will improve Outcome: Progressing   Problem: Health Behavior/Discharge Planning: Goal: Ability to identify and utilize available resources and services will improve Outcome: Progressing Goal: Ability to manage health-related needs will improve Outcome: Progressing   Problem: Metabolic: Goal: Ability to maintain appropriate glucose levels will improve Outcome: Progressing   Problem: Nutritional: Goal: Maintenance of adequate nutrition will improve Outcome: Progressing Goal: Progress toward achieving an optimal weight will improve Outcome: Progressing   Problem: Skin Integrity: Goal: Risk for impaired skin integrity will decrease Outcome: Progressing   Problem: Tissue Perfusion: Goal: Adequacy of tissue perfusion will improve Outcome: Progressing   Problem: Health Behavior/Discharge Planning: Goal: Ability to manage health-related needs will improve Outcome: Progressing   Problem: Clinical Measurements: Goal: Ability to maintain clinical measurements within normal limits will improve Outcome: Progressing Goal: Will remain free from infection Outcome: Progressing Goal: Diagnostic test results will improve Outcome: Progressing Goal: Respiratory complications will improve Outcome: Progressing Goal: Cardiovascular complication will be avoided Outcome: Progressing   Problem: Activity: Goal: Risk for activity intolerance will decrease Outcome: Progressing   Problem: Nutrition: Goal: Adequate nutrition will be maintained Outcome:  Progressing   Problem: Coping: Goal: Level of anxiety will decrease Outcome: Progressing   Problem: Elimination: Goal: Will not experience complications related to bowel motility Outcome: Progressing Goal: Will not experience complications related to urinary retention Outcome: Progressing   Problem: Pain Managment: Goal: General experience of comfort will improve Outcome: Progressing   Problem: Safety: Goal: Ability to remain free from injury will improve Outcome: Progressing   Problem: Skin Integrity: Goal: Risk for impaired skin integrity will decrease Outcome: Progressing   Problem: Education: Goal: Knowledge of General Education information will improve Description: Including pain rating scale, medication(s)/side effects and non-pharmacologic comfort measures Outcome: Progressing   Problem: Health Behavior/Discharge Planning: Goal: Ability to manage health-related needs will improve Outcome: Progressing   Problem: Clinical Measurements: Goal: Ability to maintain clinical measurements within normal limits will improve Outcome: Progressing Goal: Will remain free from infection Outcome: Progressing Goal: Diagnostic test results will improve Outcome: Progressing Goal: Respiratory complications will improve Outcome: Progressing Goal: Cardiovascular complication will be avoided Outcome: Progressing   Problem: Activity: Goal: Risk for activity intolerance will decrease Outcome: Progressing   Problem: Nutrition: Goal: Adequate nutrition will be maintained Outcome: Progressing   Problem: Coping: Goal: Level of anxiety will decrease Outcome: Progressing   Problem: Elimination: Goal: Will not experience complications related to bowel motility Outcome: Progressing Goal: Will not experience complications related to urinary retention Outcome: Progressing   Problem: Pain Managment: Goal: General experience of comfort will improve Outcome: Progressing   Problem:  Safety: Goal: Ability to remain free from injury will improve Outcome: Progressing   Problem: Skin Integrity: Goal: Risk for impaired skin integrity will decrease Outcome: Progressing   Problem: Education: Goal: Understanding of CV disease, CV risk reduction, and recovery process will improve Outcome: Progressing Goal: Individualized Educational Video(s) Outcome: Progressing   Problem: Activity: Goal: Ability to return to baseline activity level will improve Outcome: Progressing   Problem: Cardiovascular: Goal: Ability to achieve and maintain adequate cardiovascular perfusion will improve Outcome: Progressing Goal: Vascular access site(s) Level 0-1 will be maintained  Outcome: Progressing   

## 2022-07-13 NOTE — Transitions of Care (Post Inpatient/ED Visit) (Signed)
   07/13/2022  Name: Collin Bridges MRN: [id number] DOB: 1945-12-18  Pt still admitted to [facility] [place] not done

## 2022-07-13 NOTE — Progress Notes (Addendum)
Rounding Note    Patient Name: Collin Bridges Date of Encounter: 07/13/2022  Collin Bridges Cardiologist: None   Subjective  Feeling well, no issues  Inpatient Medications    Scheduled Meds:  aspirin EC  81 mg Oral Daily   atorvastatin  80 mg Oral Daily   carvedilol  12.5 mg Oral BID WC   fenofibrate  160 mg Oral Daily   fluticasone  1 spray Each Nare Daily   gabapentin  300 mg Oral QHS   hydrALAZINE  25 mg Oral Q8H   insulin aspart  0-6 Units Subcutaneous TID WC   PARoxetine  40 mg Oral Daily   QUEtiapine  50 mg Oral BID   sodium chloride flush  3 mL Intravenous Q12H   Continuous Infusions:  sodium chloride     azithromycin 500 mg (07/12/22 2150)   cefTRIAXone (ROCEPHIN)  IV 1 g (07/12/22 1954)   heparin 1,300 Units/hr (07/12/22 2157)   PRN Meds: sodium chloride, acetaminophen **OR** acetaminophen, melatonin, ondansetron (ZOFRAN) IV, sodium chloride, sodium chloride flush   Vital Signs    Vitals:   07/12/22 2200 07/13/22 0010 07/13/22 0345 07/13/22 0718  BP: 126/64 117/60 131/69 (!) 147/77  Pulse:  76 65 68  Resp:  '16 16 18  '$ Temp:  97.8 F (36.6 C) 97.7 F (36.5 C) (!) 97.4 F (36.3 C)  TempSrc:  Oral Oral Oral  SpO2:  93% 94%   Weight:  82.4 kg    Height:        Intake/Output Summary (Last 24 hours) at 07/13/2022 1053 Last data filed at 07/13/2022 0837 Gross per 24 hour  Intake 1598.79 ml  Output 1900 ml  Net -301.21 ml      07/13/2022   12:10 AM 07/12/2022    1:21 AM 07/11/2022    5:13 AM  Last 3 Weights  Weight (lbs) 181 lb 11.2 oz 183 lb 6.8 oz 182 lb 8 oz  Weight (kg) 82.419 kg 83.2 kg 82.781 kg      Telemetry    NSR- Personally Reviewed  ECG    No new tracing today- Personally Reviewed  Physical Exam   GEN: No acute distress. Comfortable  Neck: No JVD Cardiac: RRR, 2/6 harsh early peaking systolic murmur Respiratory: CTAB, no wheezes GI: Soft, nontender, non-distended  MS: No edema, warm Neuro:  Nonfocal  Psych:  Normal affect   Labs    High Sensitivity Troponin:   Recent Labs  Lab 07/08/22 1811 07/08/22 2025 07/09/22 0456  TROPONINIHS 49* 675* 1,286*     Chemistry Recent Labs  Lab 07/08/22 1811 07/08/22 2025 07/09/22 0456 07/10/22 0742 07/11/22 0043 07/12/22 0048 07/13/22 0046  NA 135  --  140   < > 137 138 137  K 3.9  --  3.6   < > 4.0 3.8 3.7  CL 102  --  107   < > 107 107 107  CO2 22  --  25   < > 19* 22 23  GLUCOSE 176*  --  138*   < > 127* 118* 140*  BUN 18  --  16   < > '15 18 17  '$ CREATININE 1.52*  --  1.33*   < > 1.46* 1.33* 1.36*  CALCIUM 9.4  --  8.8*   < > 8.5* 8.5* 8.3*  MG  --  2.1 2.1  --   --   --   --   PROT 6.5  --  5.8*  --   --   --   --  ALBUMIN 3.7  --  3.3*  --   --   --   --   AST 41  --  34  --   --   --   --   ALT 22  --  20  --   --   --   --   ALKPHOS 81  --  72  --   --   --   --   BILITOT 0.8  --  0.7  --   --   --   --   GFRNONAA 47*  --  55*   < > 49* 55* 54*  ANIONGAP 11  --  8   < > '11 9 7   '$ < > = values in this interval not displayed.    Lipids  Recent Labs  Lab 07/10/22 0742  CHOL 214*  TRIG 130  HDL 48  LDLCALC 140*  CHOLHDL 4.5    Hematology Recent Labs  Lab 07/11/22 0043 07/12/22 0048 07/13/22 0908  WBC 7.5 4.8 4.1  RBC 3.97* 3.82* 3.81*  HGB 12.6* 11.8* 11.9*  HCT 36.5* 35.1* 34.6*  MCV 91.9 91.9 90.8  MCH 31.7 30.9 31.2  MCHC 34.5 33.6 34.4  RDW 12.6 12.7 12.7  PLT 100* 102* 96*   Thyroid No results for input(s): "TSH", "FREET4" in the last 168 hours.  BNPNo results for input(s): "BNP", "PROBNP" in the last 168 hours.  DDimer  Recent Labs  Lab 07/09/22 2146  DDIMER 0.91*     Radiology    CARDIAC CATHETERIZATION  Result Date: 07/12/2022   Ost LAD lesion is 70% stenosed.  1st Sept lesion is 70% stenosed.   Mid LAD lesion is 50% stenosed with 70% stenosed side Collin Bridges in 1st Diag.   Mid Cx lesion is 95% stenosed.  1st Mrg lesion is 85% stenosed.   Dist Cx lesion is 85% stenosed.  LPAV lesion is 80% stenosed.    Prox RCA lesion is 95% stenosed.  Mid RCA-1 lesion is 80% stenosed. Mid RCA-2 lesion is 85% stenosed.   LV end diastolic pressure is mildly elevated.   There is no aortic valve stenosis. POST-CATH SUMMARY Severe multivessel CAD involving LAD/D1, LCx, OM1, with extensive disease in small codominant RCA => not favorable from a percutaneous approach. There are distal targets in the LAD, D1, OM1, L PDA/PL 2, however the RCA is likely too small for bypassing. Mildly elevated LVEDP but relatively normal EF by 2D Echo RECOMMENDATIONS. Return to nursing unit for ongoing care, will need to continue treatment for pneumonia.  From my preop standpoint, it may be best for him to be discharged home and heal from his ongoing illness prior to consideration of CVTS consultation for CABG. Results discussed with Dr. Orson Aloe, MD   CT CORONARY Scnetx W/CTA COR W/SCORE Collin Bridges W/CM &/OR WO/CM  Addendum Date: 07/11/2022   ADDENDUM REPORT: 07/11/2022 16:23 EXAM: OVER-READ INTERPRETATION  CT CHEST The following report is an over-read performed by radiologist Dr. Fonnie Birkenhead [facility] [place] on 07/11/2022. This over-read does not include interpretation of cardiac or coronary anatomy or pathology. The interpretation by the cardiologist is attached. COMPARISON:  CT chest 07/11/2022 and 07/09/2022. FINDINGS: Scout view is unremarkable. Scattered patchy peribronchovascular ground-glass. Mild dependent atelectasis bilaterally. Pulmonary nodules measure up to 5 mm in the left lower lobe (12/32). Liver margin is irregular. IMPRESSION: 1. Peribronchovascular ground-glass, likely due to a mild bronchopneumonia. 2. 5 mm left lower lobe nodule. Please refer to diagnostic CT chest  performed the same day for further details and recommendations. 3. Cirrhosis. Electronically Signed   By: Collin Picket M.D.   On: 07/11/2022 16:23   Result Date: 07/11/2022 CLINICAL DATA:  77 year old with CVA and elevated troponin EXAM:  Cardiac/Coronary  CTA TECHNIQUE: The patient was scanned on a [employer]. FINDINGS: A 120 kV prospective scan was triggered in the descending thoracic aorta at 111 HU's. Axial non-contrast 3 mm slices were carried out through the heart. The data set was analyzed on a dedicated work station and scored using the [place]. Gantry rotation speed was 250 msecs and collimation was .6 mm. 0.8 mg of sl NTG was given. The 3D data set was reconstructed in 5% intervals of the 67-82 % of the R-R cycle. Diastolic phases were analyzed on a dedicated work station using MPR, MIP and VRT modes. The patient received 80 cc of contrast. Image quality: good Aorta:  Normal size.  Aortic atherosclerosis.  No dissection. Aortic Valve: Dense  calcifications. Coronary Arteries:  Normal coronary origin.  Right dominance. RCA is a large dominant artery that gives rise to PDA and PLA. Proximal vessel is moderately stenosed 50-69% prior to dense calcified plaque that contains severe stenosis 70-99%. Mid to distal vessel is moderately diffusely diseased. PDA is small and poorly filled. Left main is a large artery that gives rise to LAD and LCX arteries. There is mixed calcified and non calcified distal LM stenosis 0-24% LAD is a large vessel. There is ostial moderate stenosis 50-69%, proximal calcified severe stenosis 70-99% prior to large D1. Mid vessel with high risk low attenuation plaque diffuse. Remainder of LAD wraps around apex. -D1 - large vessel, proximal calcified stenosis moderate to severe. -D2 - small caliber, not well filled. LCX is a non-dominant artery that gives rise to one large OM1 Eural Holzschuh. Proximal mixed plaque 30-49% mild. Mid AV groove 99% stenosis. -OM1 - large Alison Kubicki with severe proximal vessel mixed plaque, 70-99% Other findings: Normal pulmonary vein drainage into the left atrium. No evidence of PV thrombosis. Normal left atrial appendage without a thrombus. Normal size of the pulmonary artery. Please  see radiology report for non cardiac findings. IMPRESSION: 1. Coronary calcium score of 1963. This was 83 percentile for age and sex matched control. 2.  Normal coronary origin with right dominance. 3. Severe proximal LAD, proximal large OM1, and proximal RCA disease (70-99% stenosis). FFR compatible with multivessel flow limiting disease. 4. Aortic valve calcium score 3505: Compatible with severe aortic stenosis. Echocardiogram 06/02/22 showed mild aortic stenosis. 5.  Aortic atherosclerosis. 6.  No evidence of pulmonary vein thrombosis. Electronically Signed: By: Candee Furbish M.D. On: 07/11/2022 15:54   CT CORONARY FRACTIONAL FLOW RESERVE FLUID ANALYSIS  Result Date: 07/11/2022 EXAM: FFRCT ANALYSIS FINDINGS: FFRct analysis was performed on the original cardiac CT angiogram dataset. Diagrammatic representation of the FFRct analysis is provided in a separate PDF document in PACS. This dictation was created using the PDF document and an interactive 3D model of the results. 3D model is not available in the EMR/PACS. Normal FFR range is >0.80. 1. Left Main: Normal 2. LAD: Proximal 0.88 after ostial lesion, mid 0.71 abnormal 3. LCX: Proximal OM 0.98, mid 0.80, distal 0.72. AV groove mid 0.70, distal 0.56 4. Ramus: N/a 5. RCA: Proximal 0.77, mid 0.62, distal not modeled. IMPRESSION: 1. Severe flow limiting CAD in LAD, LCX, RCA. Discussed with primary team. Note: These examples are not recommendations of [employer] and only provided as examples of what  other customers are doing. Electronically Signed   By: Candee Furbish M.D.   On: 07/11/2022 16:01   CT Chest W Contrast  Result Date: 07/11/2022 CLINICAL DATA:  Chest pain. EXAM: CT CHEST WITH CONTRAST TECHNIQUE: Multidetector CT imaging of the chest was performed during intravenous contrast administration. RADIATION DOSE REDUCTION: This exam was performed according to the departmental dose-optimization program which includes automated exposure control, adjustment of  the mA and/or kV according to patient size and/or use of iterative reconstruction technique. CONTRAST:  155m OMNIPAQUE IOHEXOL 350 MG/ML SOLN COMPARISON:  July 09, 2022. FINDINGS: Cardiovascular: Atherosclerosis of thoracic aorta is noted without aneurysm or dissection. Normal cardiac size. No pericardial effusion. Extensive coronary artery calcifications are noted suggesting coronary artery disease. Normal opacification of the right inferior pulmonary vein is noted; no definite thrombus is noted. Mediastinum/Nodes: No enlarged mediastinal, hilar, or axillary lymph nodes. Thyroid gland, trachea, and esophagus demonstrate no significant findings. Lungs/Pleura: No pneumothorax or pleural effusion is noted. Right upper lobe airspace opacity is noted concerning for pneumonia as described on prior exam. Minimal bibasilar subsegmental atelectasis is noted. Stable 5 mm nodule is noted in left lower lobe best seen on image number 95 of series 4. Upper Abdomen: Hepatic cirrhosis. Musculoskeletal: No chest wall abnormality. No acute or significant osseous findings. IMPRESSION: Stable right upper lobe airspace opacity is noted concerning for pneumonia. Stable 5 mm nodule is noted in left lower lobe. Although likely benign, if the patient is high-risk, given the morphology and/or location of this nodule a non-contrast chest CT can be considered in 12 months.This recommendation follows the consensus statement: Guidelines for Management of Incidental Pulmonary Nodules Detected on CT Images: From the Fleischner Society 2017; Radiology 2017; 284:228-243. Coronary artery calcifications are noted suggesting coronary artery disease. Hepatic cirrhosis. Aortic Atherosclerosis (ICD10-I70.0). Electronically Signed   By: JMarijo ConceptionM.D.   On: 07/11/2022 13:46   VAS [place]  Result Date: 07/11/2022 Carotid Arterial Duplex Study Patient Name:  JVirgel Manifold Date of Exam:   07/11/2022 Medical Rec #: [id number]              Accession #:    [id number] of Birth: [birth year]            Patient Gender: M Patient Age:   89years [facility] [place] Referring Phys: GEgbert Garibaldi--------------------------------------------------------------------------------  Indications:       CVA and CHF, CKD. Risk Factors:      Hypertension, hyperlipidemia, Diabetes. Comparison Study:  No prior study. Performing Technologist: McKayla Maag RVT, [place]  Examination Guidelines: A complete evaluation includes B-mode imaging, spectral Doppler, color Doppler, and power Doppler as needed of all accessible portions of each vessel. Bilateral testing is considered an integral part of a complete examination. Limited examinations for reoccurring indications may be performed as noted.  Right Carotid Findings: +----------+-------+-------+--------+------------------------+-----------------+           PSV    EDV    StenosisPlaque Description      Comments                    cm/s   cm/s                                                     +----------+-------+-------+--------+------------------------+-----------------+  CCA Prox  60     8                                      intimal                                                                   thickening        +----------+-------+-------+--------+------------------------+-----------------+ CCA Distal50     9                                      intimal                                                                   thickening        +----------+-------+-------+--------+------------------------+-----------------+ ICA Prox  57     9      1-39%   irregular and                                                             heterogenous                              +----------+-------+-------+--------+------------------------+-----------------+ ICA Mid   63     19                                                        +----------+-------+-------+--------+------------------------+-----------------+ ICA Distal85     18                                                       +----------+-------+-------+--------+------------------------+-----------------+ ECA       118    13                                                       +----------+-------+-------+--------+------------------------+-----------------+ +----------+--------+-------+----------------+-------------------+           PSV cm/sEDV cmsDescribe        Arm Pressure (mmHG) +----------+--------+-------+----------------+-------------------+ [id number]            Multiphasic, WNL                    +----------+--------+-------+----------------+-------------------+ +---------+--------+--+--------+--+---------+  VertebralPSV cm/s47EDV cm/s13Antegrade +---------+--------+--+--------+--+---------+  Left Carotid Findings: +----------+-------+-------+--------+------------------------+-----------------+           PSV    EDV    StenosisPlaque Description      Comments                    cm/s   cm/s                                                     +----------+-------+-------+--------+------------------------+-----------------+ CCA Prox  108    22                                                       +----------+-------+-------+--------+------------------------+-----------------+ CCA Distal70     16                                     intimal                                                                   thickening        +----------+-------+-------+--------+------------------------+-----------------+ ICA Prox  107    25     1-39%   irregular and                                                             heterogenous                              +----------+-------+-------+--------+------------------------+-----------------+ ICA Mid   96     26                                                        +----------+-------+-------+--------+------------------------+-----------------+ ICA Distal140    38                                                       +----------+-------+-------+--------+------------------------+-----------------+ ECA       115    13                                                       +----------+-------+-------+--------+------------------------+-----------------+ +----------+--------+--------+----------------+-------------------+  PSV cm/sEDV cm/sDescribe        Arm Pressure (mmHG) +----------+--------+--------+----------------+-------------------+ [id number]             Multiphasic, WNL                    +----------+--------+--------+----------------+-------------------+ +---------+--------+--+--------+--+---------+ VertebralPSV cm/s44EDV cm/s12Antegrade +---------+--------+--+--------+--+---------+   Summary: Right Carotid: Velocities in the right ICA are consistent with a 1-39% stenosis. Left Carotid: Velocities in the left ICA are consistent with a 1-39% stenosis. Vertebrals:  Bilateral vertebral arteries demonstrate antegrade flow. Subclavians: Normal flow hemodynamics were seen in bilateral subclavian              arteries. *See table(s) above for measurements and observations.  Electronically signed by Antony Contras MD on 07/11/2022 at 12:59:42 PM.    Final     Cardiac Studies  LHC 07/12/2022   Ost LAD lesion is 70% stenosed.  1st Sept lesion is 70% stenosed.   Mid LAD lesion is 50% stenosed with 70% stenosed side Robt Okuda in 1st Diag.   Mid Cx lesion is 95% stenosed.  1st Mrg lesion is 85% stenosed.   Dist Cx lesion is 85% stenosed.  LPAV lesion is 80% stenosed.   Prox RCA lesion is 95% stenosed.  Mid RCA-1 lesion is 80% stenosed. Mid RCA-2 lesion is 85% stenosed.   LV end diastolic pressure is mildly elevated.   There is no aortic valve stenosis.   POST-CATH SUMMARY Severe multivessel CAD involving LAD/D1, LCx, OM1,  with extensive disease in small codominant RCA => not favorable from a percutaneous approach. There are distal targets in the LAD, D1, OM1, L PDA/PL 2, however the RCA is likely too small for bypassing. Mildly elevated LVEDP but relatively normal EF by 2D Echo     RECOMMENDATIONS. Return to nursing unit for ongoing care, will need to continue treatment for pneumonia.  From my preop standpoint, it may be best for him to be discharged home and heal from his ongoing illness prior to consideration of CVTS consultation for CABG.  Coronary CTA 07/11/22: FINDINGS: A 120 kV prospective scan was triggered in the descending thoracic aorta at 111 HU's. Axial non-contrast 3 mm slices were carried out through the heart. The data set was analyzed on a dedicated work station and scored using the [employer]. Gantry rotation speed was 250 msecs and collimation was .6 mm. 0.8 mg of sl NTG was given. The 3D data set was reconstructed in 5% intervals of the 67-82 % of the R-R cycle. Diastolic phases were analyzed on a dedicated work station using MPR, MIP and VRT modes. The patient received 80 cc of contrast.   Image quality: good   Aorta:  Normal size.  Aortic atherosclerosis.  No dissection.   Aortic Valve: Dense  calcifications.   Coronary Arteries:  Normal coronary origin.  Right dominance.   RCA is a large dominant artery that gives rise to PDA and PLA. Proximal vessel is moderately stenosed 50-69% prior to dense calcified plaque that contains severe stenosis 70-99%. Mid to distal vessel is moderately diffusely diseased. PDA is small and poorly filled.   Left main is a large artery that gives rise to LAD and LCX arteries. There is mixed calcified and non calcified distal LM stenosis 0-24%   LAD is a large vessel. There is ostial moderate stenosis 50-69%, proximal calcified severe stenosis 70-99% prior to large D1. Mid vessel with high risk low attenuation plaque diffuse. Remainder of LAD  wraps around apex.   -  D1 - large vessel, proximal calcified stenosis moderate to severe.   -D2 - small caliber, not well filled.   LCX is a non-dominant artery that gives rise to one large OM1 Yuka Lallier. Proximal mixed plaque 30-49% mild. Mid AV groove 99% stenosis.   -OM1 - large Caelen Reierson with severe proximal vessel mixed plaque, 70-99%   Other findings:   Normal pulmonary vein drainage into the left atrium. No evidence of PV thrombosis.   Normal left atrial appendage without a thrombus.   Normal size of the pulmonary artery.   Please see radiology report for non cardiac findings.   IMPRESSION: 1. Coronary calcium score of 1963. This was 61 percentile for age and sex matched control.   2.  Normal coronary origin with right dominance.   3. Severe proximal LAD, proximal large OM1, and proximal RCA disease (70-99% stenosis). FFR compatible with multivessel flow limiting disease.   4. Aortic valve calcium score 3505: Compatible with severe aortic stenosis. Echocardiogram 06/02/22 showed mild aortic stenosis.   5.  Aortic atherosclerosis.   6.  No evidence of pulmonary vein thrombosis.   ECHO: 07/09/2022 Sonographer Comments: Image acquisition challenging due to respiratory  motion.  IMPRESSIONS   1. Left ventricular ejection fraction, by estimation, is 60 to 65%. The left ventricle has normal function. The left ventricle demonstrates regional wall motion abnormalities (see scoring diagram/findings for description).  The basal inferior segment is hypokinetic.Left ventricular diastolic parameters are consistent with Grade I diastolic dysfunction (impaired relaxation).   2. Right ventricular systolic function is hyperdynamic. The right  ventricular size is mildly enlarged.   3. Calcified subchordal appatatus and previously describev. The mitral valve is abnormal. Trivial mitral valve regurgitation. No evidence of mitral stenosis. Moderate mitral annular calcification.   4. The  aortic valve is abnormal. Aortic valve regurgitation is not  visualized. Mild aortic valve stenosis.   5. The pulmonary veins are not sufficiently imaged to evalution for pulmonary vein thrombus based off of this study.   CTA Chest 07/09/22: IMPRESSION: 1. No sign of pulmonary embolism. 2. Posterior division inferior pulmonary vein on the RIGHT and inferior pulmonary vein showing lack of opacification. This extends to the orifice of the RIGHT inferior pulmonary vein and perhaps even into the periphery of the LEFT atrium. This is moderately suspicious for thrombus made more suspicious by the normal opacification of other pulmonary venous branches in the chest. Consider cardiology consult confirmation with venous phase assessment of the chest is suggested. 3. Multiple areas of patchy ground-glass and discrete nodularity in the RIGHT upper lobe. Findings favor an infectious or inflammatory etiology. 4. Better defined lower lobe nodules largest at 6 mm. Given the multifocal nature of findings in the chest and concern for infection but some indeterminate more discrete nodular areas with suggest 8-12 week follow-up outside of the acute setting to ensure resolution. 5. Marked hepatic cirrhosis 6. Aortic atherosclerosis and coronary artery disease.  Patient Profile     77 y.o. male with a hx of CVA, DM2, hypertension, hyperlipidemia, hep C, and CKD IIIA who presented to the ER with severe sepsis from CAP with course complicated by chest pain and elevated troponin for which Cardiology has been consulted for NSTEMI. S/p LHC per above.  Assessment & Plan    #NSTEMI: #Multivessel CAD #HTN Patient presented with chest pressure in the setting of community acquired PNA. Trop [phone]. ECG with no acute ischemic changes. TTE with normal BiV function and no significant WMA. Coronary CTA with severe multivessel disease.  S/p LHC -LHC ->multivessel dx per above. No plans for CABG with ongoing  PNA -stop heparin gtt ; s/p 48 hours for NSTEMI -Continue ASA '81mg'$  daily, plavix '75mg'$  daily [will need to hold 5 days prior to his surgery] -increased lipitor '40mg'$  daily to 80 mg daily ; LDL 140 -restarted home losartan 100 mg daily -Change metop to coreg 12.'5mg'$  BID  #Community Acquired PNA: Patient presented with fevers, rigors, chest discomfort and SOB found to have community acquired PNA. Now on ABX per primary. Feeling significantly better with improvement of symptoms. -Continue CTX  #CVA: MRI brain with small recurrent acute or subacute infarcts that are lacunar in nature likely related to chronic small disease. -Continue ASA, plavix and lipitor as above -Management per Neuro   #Mild AS: -Continue serial monitoring as outpatient  #HLD: -Continue lipitor as above  #DMII: -Management per primary    Will arrange follow-up for him. Otherwise, he can be discharged from a cardiology standpoint.   Time Spent Directly with Patient:  I have spent a total of 35 minutes with the patient reviewing [facility] notes, telemetry, EKGs, labs and examining the patient as well as establishing an assessment and plan that was discussed personally with the patient.  > 50% of time was spent in direct patient care.   For questions or updates, please contact [place] Please consult [email or web address] for contact info under        Signed, Janina Mayo, MD  07/13/2022, 10:53 AM

## 2022-07-13 NOTE — Progress Notes (Addendum)
ANTICOAGULATION CONSULT NOTE - Follow Up Consult  Pharmacy Consult for Heparin Indication:  CVA/ possible right inferior pulmonary vein thrombus  No Known Allergies  Patient Measurements: Height: 6' (182.9 cm) Weight: 82.4 kg (181 lb 11.2 oz) IBW/kg (Calculated) : 77.6 Heparin Dosing Weight: 82.8 kg  Vital Signs: Temp: 97.4 F (36.3 C) (02/28 0718) Temp Source: Oral (02/28 0718) BP: 147/77 (02/28 0718) Pulse Rate: 68 (02/28 0718)  Labs: Recent Labs    07/11/22 0043 07/12/22 0048 07/13/22 0046  HGB 12.6* 11.8*  --   HCT 36.5* 35.1*  --   PLT 100* 102*  --   HEPARINUNFRC 0.46 0.42 0.17*  CREATININE 1.46* 1.33* 1.36*     Estimated Creatinine Clearance: 49.9 mL/min (A) (by C-G formula based on SCr of 1.36 mg/dL (H)).  Assessment: 77 yo male admitted with CVA. CTA negative for PE, but suspicious for pulmonary vein thrombosis. No anticoagulation prior to admission, was taking Aspirin and Plavix. MRI shows scattered bilateral strokes, etiology likely small vessel disease per Neuro. Pharmacy consulted to dose heparin. No boluses, low-therapeutic goal. Neuro also planned continuation of heparin but no evidence of pulmonary vein thrombosis on coronary CT 2/26.  Heparin resumed post-cath 2/27 showing multivessel disease. Appears plan is to continue heparin for now with possible CABG pending, although patient may be discharged home in the meantime with CVTS follow up per cath note.   Level drawn overnight at 0.17 was not accurate due to being drawn less than 3 hours after heparin resumed post-cath. Will recheck level this AM. Hg down to 11.8, plt stable 102 (last 2/27). No current bleed issues reported.  Goal of Therapy:  Heparin level 0.3-0.5 units/ml Monitor platelets by anticoagulation protocol: Yes   Plan:  Continue heparin infusion at 1300 units/hr F/u AM heparin level/CBC Monitor daily heparin level and CBC, s/sx bleeding F/u anticoagulation plan   Arturo Morton,  PharmD, BCPS Please check AMION for all [place] contact numbers Clinical Pharmacist 07/13/2022 8:12 AM

## 2022-07-13 NOTE — Progress Notes (Signed)
STROKE TEAM PROGRESS NOTE   INTERVAL HISTORY: No one is at the bedside.  Patient continues to do well.  He has no complaints today.  Vital signs are stable.  Neurological exam is unchanged. He had coronary catheter angiogram yesterday which showed triple-vessel disease.  He has been referred for cardiothoracic surgery consult to discuss CABG   Vitals:   07/13/22 0010 07/13/22 0345 07/13/22 0718 07/13/22 1142  BP: 117/60 131/69 (!) 147/77 139/77  Pulse: 76 65 68 63  Resp: '16 16 18 18  '$ Temp: 97.8 F (36.6 C) 97.7 F (36.5 C) (!) 97.4 F (36.3 C) 97.6 F (36.4 C)  TempSrc: Oral Oral Oral Oral  SpO2: 93% 94%  95%  Weight: 82.4 kg     Height:       CBC:  Recent Labs  Lab 07/09/22 0456 07/10/22 0742 07/12/22 0048 07/13/22 0908  WBC 10.8*   < > 4.8 4.1  NEUTROABS 8.1*  --   --   --   HGB 12.7*   < > 11.8* 11.9*  HCT 36.8*   < > 35.1* 34.6*  MCV 93.2   < > 91.9 90.8  PLT 99*   < > 102* 96*   < > = values in this interval not displayed.   Basic Metabolic Panel:  Recent Labs  Lab 07/08/22 2025 07/09/22 0456 07/10/22 0742 07/12/22 0048 07/13/22 0046  NA  --  140   < > 138 137  K  --  3.6   < > 3.8 3.7  CL  --  107   < > 107 107  CO2  --  25   < > 22 23  GLUCOSE  --  138*   < > 118* 140*  BUN  --  16   < > 18 17  CREATININE  --  1.33*   < > 1.33* 1.36*  CALCIUM  --  8.8*   < > 8.5* 8.3*  MG 2.1 2.1  --   --   --    < > = values in this interval not displayed.   Lipid Panel:  Recent Labs  Lab 07/10/22 0742  CHOL 214*  TRIG 130  HDL 48  CHOLHDL 4.5  VLDL 26  LDLCALC 140*   HgbA1c:  Recent Labs  Lab 07/09/22 2146  HGBA1C 6.3*   Urine Drug Screen: No results for input(s): "LABOPIA", "COCAINSCRNUR", "LABBENZ", "AMPHETMU", "THCU", "LABBARB" in the last 168 hours.  Alcohol Level No results for input(s): "ETH" in the last 168 hours.  IMAGING past 24 hours CARDIAC CATHETERIZATION  Result Date: 07/12/2022   Ost LAD lesion is 70% stenosed.  1st Sept lesion is  70% stenosed.   Mid LAD lesion is 50% stenosed with 70% stenosed side branch in 1st Diag.   Mid Cx lesion is 95% stenosed.  1st Mrg lesion is 85% stenosed.   Dist Cx lesion is 85% stenosed.  LPAV lesion is 80% stenosed.   Prox RCA lesion is 95% stenosed.  Mid RCA-1 lesion is 80% stenosed. Mid RCA-2 lesion is 85% stenosed.   LV end diastolic pressure is mildly elevated.   There is no aortic valve stenosis. POST-CATH SUMMARY Severe multivessel CAD involving LAD/D1, LCx, OM1, with extensive disease in small codominant RCA => not favorable from a percutaneous approach. There are distal targets in the LAD, D1, OM1, L PDA/PL 2, however the RCA is likely too small for bypassing. Mildly elevated LVEDP but relatively normal EF by 2D Echo RECOMMENDATIONS. Return to  nursing unit for ongoing care, will need to continue treatment for pneumonia.  From my preop standpoint, it may be best for him to be discharged home and heal from his ongoing illness prior to consideration of CVTS consultation for CABG. Results discussed with Dr. Orson Aloe, MD    Sylva - well nourished, well developed pleasant elderly Caucasian male, in no apparent distress.     Ophthalmologic - fundi not visualized due to noncooperation.     Cardiovascular - regular rhythm and rate   Mental Status -  Alert. A/O x 4.    Cranial Nerves II - XII - II - Vision intact OU. III, IV, VI - Extraocular movements intact. V - Facial sensation intact bilaterally. VII - Facial movement intact bilaterally. VIII - Hearing & vestibular intact bilaterally. X - Palate elevates symmetrically. XI - Chin turning & shoulder shrug intact bilaterally. XII - Tongue protrusion intact.   Motor Strength - [name] was normal in all extremities and pronator drift was absent.    Motor Tone & Bulk - Muscle tone was assessed at the neck and appendages and was normal.  Bulk was normal and fasciculations were absent.   Sensory -  normal to LT.   Coordination - no ataxia.    Gait and Station - deferred  ASSESSMENT/PLAN [name] is a 77 y.o. male history of TIA being followed by [facility] outpatient, heart failure admitted initially for possible sepsis with pneumonia found to have pulmonary  vein decreased flow ? thrombus on CTA cardiology consulted.  MRI shows scattered bilateral subcortical strokes etiology likely small vessel disease.  Doubt cardioembolic  MR angio: [id number] ACA aneurysm-unchanged.   MRI : scattered strokes in both hemispheres mostly subcortical and periventricular. 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  Carotid Doppler bilateral 1-39% carotid stenosis.   2D Echo 60-65%. Calcified mitral valve.  Marland Kitchen LDL 140 goal   HgbA1c 6.3 VTE prophylaxis - on heparin ggt.     Diet   Diet Heart Room service appropriate? Yes; Fluid consistency: Thin   Plavix prior to admission, now on heparin IV.  Therapy recommendations:  pending. Disposition:  pending.  Hypertension Permissive hypertension (OK if < 220/120) but gradually normalize in 5-7 days Long-term BP goal normotensive  Hyperlipidemia Home meds:  lipitor '40mg'$ , resumed in hospital LDL 140, goal < 70 High intensity statin now on '80mg'$  lipitor. Continue statin at discharge  Diabetes type II Controlled  HgbA1c 6.3, goal < 7.0 CBGs Recent Labs    07/12/22 2057 07/13/22 0556 07/13/22 1140  GLUCAP 214* 107* 134*    SSI  Other Stroke Risk Factors Advanced Age >/= 74  Hospital day # 5   Patient presented with bilateral subcortical infarcts likely from small vessel disease.  CT angiogram suggests abnormal flow in the pulmonary vein raising question of thrombus.   Recommend discussion with cardiology whether patient really has pulmonary vein thrombosis and does not need long-term anticoagulation if not he can be discharged home for elective consultation with cardiothoracic surgery for CABG.  If heparin or anticoagulation is discontinued  recommend aspirin and Plavix discussed with patient and [email or web address] Greater than 50% time during this 25-minute visit was spent in counseling and coordination of care and discussion with patient and care team and answering questions about his strokes and abnormal brain MRI and coronary angiogram findings.  Stroke team will sign off.  Kindly call for questions  Antony Contras, MD Medical Director [place] Pager: [phone]  07/13/2022 1:29 PM   To contact Stroke Continuity provider, please refer to [email or web address] After hours, contact General Neurology

## 2022-07-13 NOTE — Discharge Summary (Signed)
Physician Discharge Summary   Patient: Collin Bridges MRN: [id number] DOB: [birth year]  Admit date:     07/08/2022  Discharge date: 07/13/22  Discharge Physician: Cordelia Poche, MD   PCP: [name], MD   Recommendations at discharge:  Cardiology follow-up Neurology follow-up  Discharge Diagnoses: Principal Problem:   CAP (community acquired pneumonia) Active Problems:   Essential hypertension   Mild aortic stenosis   Hyperlipidemia   Stage 3a chronic kidney disease (CKD) ([place])   DM2 (diabetes mellitus, type 2) ([place])   Severe sepsis (HCC)   Chest pain   Elevated troponin   Depression   Allergic rhinitis   Chronic diastolic CHF (congestive heart failure) ([place])   NSTEMI (non-ST elevated myocardial infarction) ([place])   Abnormal cardiac CT angiography  Resolved Problems:   * No resolved hospital problems. *  Hospital Course: Collin Bridges is a 77 y.o. male with a history of chronic diastolic heart failure, aortic stenosis, CKD stage IIIa, diabetes mellitus type 2, hyperlipidemia, hypertension. Patient presented secondary to fever, chills, and chest pain with evidence of pneumonia. He was treated empirically with antibiotics. During workup, he was found to have worsening troponin, concerning for NSTEMI. Cardiology consulted. Transthoracic Echocardiogram obtained was significant for regional wall motion abnormalities of the left ventricle, and subsequent cardiac catheterization was significant for evidence of severe multivessel disease. Decision made to defer CABG referral until outpatient follow-up secondary to concurrent pneumonia. Patient completed antibiotic therapy prior to discharge.  Assessment and Plan:  NSTEMI Patient presented with chest pain, with associated fever and flu symptoms. Troponin of [phone]; no peak. Cardiology consulted. Patient managed on Heparin IV. Transthoracic Echocardiogram significant for LVEF of 60-65% with regional wall  motion abnormalities; right ventricle mildly enlarged with hyperdynamic function. Left heart catheterization performed on 2/27 significant for severe multivessel disease. Cardiology recommendation for surgical management with CABG, but this is deferred secondary to acute infection. Patient discharged on aspirin and Plavix. Cardiology will follow-up as an outpatient.   Acute CVA MRI confirms small recurrent acute/subacute infarcts scattered in both hemispheres. MRA head without emergent large vessel occlusion or high-grade stenosis. LDL of 140. Hemoglobin A1C of 6.3%. Transthoracic Echocardiogram significant for no atrial level shunt. Neurology recommendations for aspirin and Plavix as directed by Cardiology for NSTEMI. PT recommendations for no follow-up or equipment. Patient discharged on aspirin, Plavix, Lipitor. Recommendation for Neurology follow-up in 4 weeks.   Sepsis Secondary to pneumonia. Blood cultures with no growth.   Right upper lobe pneumonia Patient treated empirically with Ceftriaxone and azithromycin. Patient completed a 5 day course prior to discharge home.   Aortic stenosis Noted.   Thrombocytopenia Chronic. Mild. Stable. Presumed secondary to known liver disease.   Diabetes mellitus, type 2 Hemoglobin A1C of 6.3%. Well controlled. Continue home metformin.   Depression Continue home Seroquel, and Paxil   Hyperlipidemia Increased to Lipitor 80 mg daily. LDL of 140   Primary hypertension Continue losartan and Coreg. Patient also started on hydralazine and will discharge with new prescription.    Pulmonary nodule 5 mm nodule noted on CT imaging and described as stable. Patient is low-risk, so recommendation is for no further workup.   Hepatic cirrhosis Noted.   Consultants: Cardiology, Neurology Procedures performed:  Transthoracic Echocardiogram Left heart catheterization   Disposition: Home Diet recommendation: Cardiac and Carb modified diet   DISCHARGE  MEDICATION: Allergies as of 07/13/2022   No Known Allergies      Medication List     TAKE these medications  aspirin EC 81 MG tablet Take 1 tablet (81 mg total) by mouth daily. Swallow whole. Start taking on: July 14, 2022   atorvastatin 80 MG tablet Commonly known as: LIPITOR Take 1 tablet (80 mg total) by mouth daily. Start taking on: July 14, 2022 What changed:  medication strength how much to take   cholecalciferol 25 MCG (1000 UNIT) tablet Commonly known as: VITAMIN D3 Take 1 tablet (1,000 Units total) by mouth daily. Take 1,000 Units by mouth daily.   clopidogrel 75 MG tablet Commonly known as: PLAVIX Take 1 tablet (75 mg total) by mouth daily.   fenofibrate 160 MG tablet Take 1 tablet (160 mg total) by mouth daily.   fluticasone 50 MCG/ACT nasal spray Commonly known as: FLONASE USE 2 SPRAYS IN EACH NOSTRIL DAILY What changed:  how much to take how to take this when to take this   gabapentin 300 MG capsule Commonly known as: NEURONTIN TAKE 1 CAPSULE BY MOUTH EVERY  NIGHT AT BEDTIME   HAIR/SKIN/NAILS/BIOTIN PO Take 1 tablet by mouth daily.   hydrALAZINE 25 MG tablet Commonly known as: APRESOLINE Take 1 tablet (25 mg total) by mouth every 8 (eight) hours.   losartan 100 MG tablet Commonly known as: COZAAR Take 1 tablet (100 mg total) by mouth daily.   metFORMIN 500 MG 24 hr tablet Commonly known as: GLUCOPHAGE-XR Take 1 tablet (500 mg total) by mouth daily with breakfast.   PARoxetine 40 MG tablet Commonly known as: PAXIL TAKE 1 TABLET(40 MG) BY MOUTH EVERY MORNING What changed: See the new instructions.   QUEtiapine 50 MG tablet Commonly known as: SEROQUEL TAKE 1 TABLET(50 MG) BY MOUTH TWICE DAILY   sildenafil 100 MG tablet Commonly known as: Viagra Take 0.5-1 tablets (50-100 mg total) by mouth daily as needed for erectile dysfunction.   vitamin E 180 MG (400 UNITS) capsule Take 400 Units by mouth daily.   zolpidem 10 MG  tablet Commonly known as: AMBIEN TAKE 1 TABLET(10 MG) BY MOUTH AT BEDTIME AS NEEDED FOR SLEEP What changed:  how much to take how to take this when to take this        Discharge Exam: BP 139/77 (BP Location: Left Arm)   Pulse 63   Temp 97.6 F (36.4 C) (Oral)   Resp 18   Ht 6' (1.829 m)   Wt 82.4 kg   SpO2 95%   BMI 24.64 kg/m   General exam: Appears calm and comfortable Respiratory system: Clear to auscultation. Respiratory effort normal. Cardiovascular system: S1 & S2 heard, RRR. Systolic murmur Gastrointestinal system: Abdomen is nondistended, soft and nontender. Normal bowel sounds heard. Central nervous system: Alert and oriented. No focal neurological deficits. Musculoskeletal: No edema. No calf tenderness Skin: No cyanosis. No rashes Psychiatry: Judgement and insight appear normal. Mood & affect appropriate.   Condition at discharge: stable  The results of significant diagnostics from this hospitalization (including imaging, microbiology, ancillary and laboratory) are listed below for reference.   Imaging Studies: CARDIAC CATHETERIZATION  Result Date: 07/12/2022   Ost LAD lesion is 70% stenosed.  1st Sept lesion is 70% stenosed.   Mid LAD lesion is 50% stenosed with 70% stenosed side branch in 1st Diag.   Mid Cx lesion is 95% stenosed.  1st Mrg lesion is 85% stenosed.   Dist Cx lesion is 85% stenosed.  LPAV lesion is 80% stenosed.   Prox RCA lesion is 95% stenosed.  Mid RCA-1 lesion is 80% stenosed. Mid RCA-2 lesion is 85% stenosed.  LV end diastolic pressure is mildly elevated.   There is no aortic valve stenosis. POST-CATH SUMMARY Severe multivessel CAD involving LAD/D1, LCx, OM1, with extensive disease in small codominant RCA => not favorable from a percutaneous approach. There are distal targets in the LAD, D1, OM1, L PDA/PL 2, however the RCA is likely too small for bypassing. Mildly elevated LVEDP but relatively normal EF by 2D Echo RECOMMENDATIONS. Return to  nursing unit for ongoing care, will need to continue treatment for pneumonia.  From my preop standpoint, it may be best for him to be discharged home and heal from his ongoing illness prior to consideration of CVTS consultation for CABG. Results discussed with Dr. Orson Aloe, MD   CT CORONARY [facility] W/CTA COR W/SCORE Lewanda Rife W/CM &/OR WO/CM  Addendum Date: 07/11/2022   ADDENDUM REPORT: 07/11/2022 16:23 EXAM: OVER-READ INTERPRETATION  CT CHEST The following report is an over-read performed by radiologist Dr. Fonnie Birkenhead [facility] on 07/11/2022. This over-read does not include interpretation of cardiac or coronary anatomy or pathology. The interpretation by the cardiologist is attached. COMPARISON:  CT chest 07/11/2022 and 07/09/2022. FINDINGS: Scout view is unremarkable. Scattered patchy peribronchovascular ground-glass. Mild dependent atelectasis bilaterally. Pulmonary nodules measure up to 5 mm in the left lower lobe (12/32). Liver margin is irregular. IMPRESSION: 1. Peribronchovascular ground-glass, likely due to a mild bronchopneumonia. 2. 5 mm left lower lobe nodule. Please refer to diagnostic CT chest performed the same day for further details and recommendations. 3. Cirrhosis. Electronically Signed   By: Lorin Picket M.D.   On: 07/11/2022 16:23   Result Date: 07/11/2022 CLINICAL DATA:  77 year old with CVA and elevated troponin EXAM: Cardiac/Coronary  CTA TECHNIQUE: The patient was scanned on a [employer]. FINDINGS: A 120 kV prospective scan was triggered in the descending thoracic aorta at 111 HU's. Axial non-contrast 3 mm slices were carried out through the heart. The data set was analyzed on a dedicated work station and scored using the Crane. Gantry rotation speed was 250 msecs and collimation was .6 mm. 0.8 mg of sl NTG was given. The 3D data set was reconstructed in 5% intervals of the 67-82 % of the R-R cycle. Diastolic phases were analyzed on a  dedicated work station using MPR, MIP and VRT modes. The patient received 80 cc of contrast. Image quality: good Aorta:  Normal size.  Aortic atherosclerosis.  No dissection. Aortic Valve: Dense  calcifications. Coronary Arteries:  Normal coronary origin.  Right dominance. RCA is a large dominant artery that gives rise to PDA and PLA. Proximal vessel is moderately stenosed 50-69% prior to dense calcified plaque that contains severe stenosis 70-99%. Mid to distal vessel is moderately diffusely diseased. PDA is small and poorly filled. Left main is a large artery that gives rise to LAD and LCX arteries. There is mixed calcified and non calcified distal LM stenosis 0-24% LAD is a large vessel. There is ostial moderate stenosis 50-69%, proximal calcified severe stenosis 70-99% prior to large D1. Mid vessel with high risk low attenuation plaque diffuse. Remainder of LAD wraps around apex. -D1 - large vessel, proximal calcified stenosis moderate to severe. -D2 - small caliber, not well filled. LCX is a non-dominant artery that gives rise to one large OM1 branch. Proximal mixed plaque 30-49% mild. Mid AV groove 99% stenosis. -OM1 - large branch with severe proximal vessel mixed plaque, 70-99% Other findings: Normal pulmonary vein drainage into the left atrium. No evidence of PV thrombosis. Normal  left atrial appendage without a thrombus. Normal size of the pulmonary artery. Please see radiology report for non cardiac findings. IMPRESSION: 1. Coronary calcium score of 1963. This was 58 percentile for age and sex matched control. 2.  Normal coronary origin with right dominance. 3. Severe proximal LAD, proximal large OM1, and proximal RCA disease (70-99% stenosis). FFR compatible with multivessel flow limiting disease. 4. Aortic valve calcium score 3505: Compatible with severe aortic stenosis. Echocardiogram 06/02/22 showed mild aortic stenosis. 5.  Aortic atherosclerosis. 6.  No evidence of pulmonary vein thrombosis.  Electronically Signed: By: Candee Furbish M.D. On: 07/11/2022 15:54   CT CORONARY FRACTIONAL FLOW RESERVE FLUID ANALYSIS  Result Date: 07/11/2022 EXAM: FFRCT ANALYSIS FINDINGS: FFRct analysis was performed on the original cardiac CT angiogram dataset. Diagrammatic representation of the FFRct analysis is provided in a separate PDF document in PACS. This dictation was created using the PDF document and an interactive 3D model of the results. 3D model is not available in the EMR/PACS. Normal FFR range is >0.80. 1. Left Main: Normal 2. LAD: Proximal 0.88 after ostial lesion, mid 0.71 abnormal 3. LCX: Proximal OM 0.98, mid 0.80, distal 0.72. AV groove mid 0.70, distal 0.56 4. Ramus: N/a 5. RCA: Proximal 0.77, mid 0.62, distal not modeled. IMPRESSION: 1. Severe flow limiting CAD in LAD, LCX, RCA. Discussed with primary team. Note: These examples are not recommendations of [employer] and only provided as examples of what other customers are doing. Electronically Signed   By: Candee Furbish M.D.   On: 07/11/2022 16:01   CT Chest W Contrast  Result Date: 07/11/2022 CLINICAL DATA:  Chest pain. EXAM: CT CHEST WITH CONTRAST TECHNIQUE: Multidetector CT imaging of the chest was performed during intravenous contrast administration. RADIATION DOSE REDUCTION: This exam was performed according to the departmental dose-optimization program which includes automated exposure control, adjustment of the mA and/or kV according to patient size and/or use of iterative reconstruction technique. CONTRAST:  111m OMNIPAQUE IOHEXOL 350 MG/ML SOLN COMPARISON:  July 09, 2022. FINDINGS: Cardiovascular: Atherosclerosis of thoracic aorta is noted without aneurysm or dissection. Normal cardiac size. No pericardial effusion. Extensive coronary artery calcifications are noted suggesting coronary artery disease. Normal opacification of the right inferior pulmonary vein is noted; no definite thrombus is noted. Mediastinum/Nodes: No enlarged  mediastinal, hilar, or axillary lymph nodes. Thyroid gland, trachea, and esophagus demonstrate no significant findings. Lungs/Pleura: No pneumothorax or pleural effusion is noted. Right upper lobe airspace opacity is noted concerning for pneumonia as described on prior exam. Minimal bibasilar subsegmental atelectasis is noted. Stable 5 mm nodule is noted in left lower lobe best seen on image number 95 of series 4. Upper Abdomen: Hepatic cirrhosis. Musculoskeletal: No chest wall abnormality. No acute or significant osseous findings. IMPRESSION: Stable right upper lobe airspace opacity is noted concerning for pneumonia. Stable 5 mm nodule is noted in left lower lobe. Although likely benign, if the patient is high-risk, given the morphology and/or location of this nodule a non-contrast chest CT can be considered in 12 months.This recommendation follows the consensus statement: Guidelines for Management of Incidental Pulmonary Nodules Detected on CT Images: From the Fleischner Society 2017; Radiology 2017; 284:228-243. Coronary artery calcifications are noted suggesting coronary artery disease. Hepatic cirrhosis. Aortic Atherosclerosis (ICD10-I70.0). Electronically Signed   By: JMarijo ConceptionM.D.   On: 07/11/2022 13:46   VAS [place]  Result Date: 07/11/2022 Carotid Arterial Duplex Study Patient Name:  JLOHITH KIEHN Date of Exam:   07/11/2022 Medical Rec #:  [id number]              Accession #:    [id number] Date of Birth: 04/05/1946             Patient Gender: M Patient Age:   17 years Exam Location[facility]  [facility] Procedure:      VAS US CAROTID Referring Phys: Egbert Garibaldi --------------------------------------------------------------------------------  Indications:       CVA and CHF, CKD. Risk Factors:      Hypertension, hyperlipidemia, Diabetes. Comparison Study:  No prior study. Performing Technologist: McKayla Maag RVT, [place]  Examination Guidelines: A complete evaluation includes B-mode  imaging, spectral Doppler, color Doppler, and power Doppler as needed of all accessible portions of each vessel. Bilateral testing is considered an integral part of a complete examination. Limited examinations for reoccurring indications may be performed as noted.  Right Carotid Findings: +----------+-------+-------+--------+------------------------+-----------------+           PSV    EDV    StenosisPlaque Description      Comments                    cm/s   cm/s                                                     +----------+-------+-------+--------+------------------------+-----------------+ CCA Prox  60     8                                      intimal                                                                   thickening        +----------+-------+-------+--------+------------------------+-----------------+ CCA Distal50     9                                      intimal                                                                   thickening        +----------+-------+-------+--------+------------------------+-----------------+ ICA Prox  57     9      1-39%   irregular and                                                             heterogenous                              +----------+-------+-------+--------+------------------------+-----------------+  ICA Mid   63     19                                                       +----------+-------+-------+--------+------------------------+-----------------+ ICA Distal85     18                                                       +----------+-------+-------+--------+------------------------+-----------------+ ECA       118    13                                                       +----------+-------+-------+--------+------------------------+-----------------+ +----------+--------+-------+----------------+-------------------+           PSV cm/sEDV cmsDescribe        Arm  Pressure (mmHG) +----------+--------+-------+----------------+-------------------+ [id number]            Multiphasic, WNL                    +----------+--------+-------+----------------+-------------------+ +---------+--------+--+--------+--+---------+ VertebralPSV cm/s47EDV cm/s13Antegrade +---------+--------+--+--------+--+---------+  Left Carotid Findings: +----------+-------+-------+--------+------------------------+-----------------+           PSV    EDV    StenosisPlaque Description      Comments                    cm/s   cm/s                                                     +----------+-------+-------+--------+------------------------+-----------------+ CCA Prox  108    22                                                       +----------+-------+-------+--------+------------------------+-----------------+ CCA Distal70     16                                     intimal                                                                   thickening        +----------+-------+-------+--------+------------------------+-----------------+ ICA Prox  107    25     1-39%   irregular and  heterogenous                              +----------+-------+-------+--------+------------------------+-----------------+ ICA Mid   96     26                                                       +----------+-------+-------+--------+------------------------+-----------------+ ICA Distal140    38                                                       +----------+-------+-------+--------+------------------------+-----------------+ ECA       115    13                                                       +----------+-------+-------+--------+------------------------+-----------------+ +----------+--------+--------+----------------+-------------------+           PSV cm/sEDV cm/sDescribe         Arm Pressure (mmHG) +----------+--------+--------+----------------+-------------------+ [id number]             Multiphasic, WNL                    +----------+--------+--------+----------------+-------------------+ +---------+--------+--+--------+--+---------+ VertebralPSV cm/s44EDV cm/s12Antegrade +---------+--------+--+--------+--+---------+   Summary: Right Carotid: Velocities in the right ICA are consistent with a 1-39% stenosis. Left Carotid: Velocities in the left ICA are consistent with a 1-39% stenosis. Vertebrals:  Bilateral vertebral arteries demonstrate antegrade flow. Subclavians: Normal flow hemodynamics were seen in bilateral subclavian              arteries. *See table(s) above for measurements and observations.  Electronically signed by Antony Contras MD on 07/11/2022 at 12:59:42 PM.    Final    VAS [place] LOWER EXTREMITY VENOUS (DVT)  Result Date: 07/10/2022  Lower Venous DVT Study Patient Name:  HAWTHORN PATI  Date of Exam:   07/09/2022 Medical Rec #: [id number]              Accession #:    [id number] Date of Birth: Sep 27, 1945             Patient Gender: M Patient Age:   74 years Exam Location:  [facility] Procedure:      VAS [place] LOWER EXTREMITY VENOUS (DVT) Referring Phys: Phillips Climes --------------------------------------------------------------------------------  Indications: Swelling.  Comparison Study: No prior study on file Performing Technologist: Sharion Dove RVS  Examination Guidelines: A complete evaluation includes B-mode imaging, spectral Doppler, color Doppler, and power Doppler as needed of all accessible portions of each vessel. Bilateral testing is considered an integral part of a complete examination. Limited examinations for reoccurring indications may be performed as noted. The reflux portion of the exam is performed with the patient in reverse Trendelenburg.  +---------+---------------+---------+-----------+----------+--------------+ RIGHT     CompressibilityPhasicitySpontaneityPropertiesThrombus Aging +---------+---------------+---------+-----------+----------+--------------+ CFV      Full           Yes      Yes                                 +---------+---------------+---------+-----------+----------+--------------+  SFJ      Full                                                        +---------+---------------+---------+-----------+----------+--------------+ FV Prox  Full                                                        +---------+---------------+---------+-----------+----------+--------------+ FV Mid   Full                                                        +---------+---------------+---------+-----------+----------+--------------+ FV DistalFull                                                        +---------+---------------+---------+-----------+----------+--------------+ PFV      Full                                                        +---------+---------------+---------+-----------+----------+--------------+ POP      Full           Yes      Yes                                 +---------+---------------+---------+-----------+----------+--------------+ PTV      Full                                                        +---------+---------------+---------+-----------+----------+--------------+ PERO     Full                                                        +---------+---------------+---------+-----------+----------+--------------+   +---------+---------------+---------+-----------+----------+--------------+ LEFT     CompressibilityPhasicitySpontaneityPropertiesThrombus Aging +---------+---------------+---------+-----------+----------+--------------+ CFV      Full           Yes      Yes                                 +---------+---------------+---------+-----------+----------+--------------+ SFJ      Full                                                         +---------+---------------+---------+-----------+----------+--------------+  FV Prox  Full                                                        +---------+---------------+---------+-----------+----------+--------------+ FV Mid   Full                                                        +---------+---------------+---------+-----------+----------+--------------+ FV DistalFull                                                        +---------+---------------+---------+-----------+----------+--------------+ PFV      Full                                                        +---------+---------------+---------+-----------+----------+--------------+ POP      Full           Yes      Yes                                 +---------+---------------+---------+-----------+----------+--------------+ PTV      Full                                                        +---------+---------------+---------+-----------+----------+--------------+ PERO     Full                                                        +---------+---------------+---------+-----------+----------+--------------+     Summary: BILATERAL: - No evidence of deep vein thrombosis seen in the lower extremities, bilaterally. -No evidence of popliteal cyst, bilaterally.   *See table(s) above for measurements and observations. Electronically signed by Orlie Pollen on 07/10/2022 at 9:30:57 AM.    Final    MR ANGIO HEAD WO CONTRAST  Result Date: 07/10/2022 CLINICAL DATA:  Stroke follow-up EXAM: MRA HEAD WITHOUT CONTRAST TECHNIQUE: Angiographic images of the Circle of Willis were acquired using MRA technique without intravenous contrast. COMPARISON:  None Available. FINDINGS: POSTERIOR CIRCULATION: --Vertebral arteries: Normal --Inferior cerebellar arteries: Normal. --Basilar artery: Normal. --Superior cerebellar arteries: Normal. --Posterior cerebral arteries: Unchanged appearance of the  diminutive right PCA ANTERIOR CIRCULATION: --Intracranial internal carotid arteries: Normal. --Anterior cerebral arteries (ACA): Unchanged posteriorly projecting focus from the anterior communicating artery measuring 1 x 2 mm. --Middle cerebral arteries (MCA): Normal. IMPRESSION: 1. No emergent large vessel occlusion or high-grade stenosis. 2. Unchanged appearance of the diminutive right PCA. 3. 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  Electronically Signed   By: Ulyses Jarred M.D.   On: 07/10/2022 03:42   ECHOCARDIOGRAM COMPLETE  Result Date: 07/09/2022    ECHOCARDIOGRAM REPORT   Patient Name:   POWER ARNET Date of Exam: 07/09/2022 Medical Rec #:  [id number]             Height:       72.0 in Accession #:    [id number]            Weight:       180.0 lb Date of Birth:  [birth year]            BSA:          2.037 m Patient Age:    60 years              BP:           161/85 mmHg Patient Gender: M                     HR:           89 bpm. Exam Location:  Inpatient Procedure: 2D Echo Indications:    elevated troponin  History:        Patient has prior history of Echocardiogram examinations, most                 recent 06/16/2021. CHF, chronic kidney disease. Sepsis.; Risk                 Factors:Diabetes, Hypertension and Dyslipidemia.  Sonographer:    Johny Chess RDCS Referring Phys: [id number] Rhetta Mura  Sonographer Comments: Image acquisition challenging due to respiratory motion. IMPRESSIONS  1. Left ventricular ejection fraction, by estimation, is 60 to 65%. The left ventricle has normal function. The left ventricle demonstrates regional wall motion abnormalities (see scoring diagram/findings for description). Left ventricular diastolic parameters are consistent with Grade I diastolic dysfunction (impaired relaxation).  2. Right ventricular systolic function is hyperdynamic. The right ventricular size is mildly enlarged.  3. Calcified subchordal appatatus and previously  describev. The mitral valve is abnormal. Trivial mitral valve regurgitation. No evidence of mitral stenosis. Moderate mitral annular calcification.  4. The aortic valve is abnormal. Aortic valve regurgitation is not visualized. Mild aortic valve stenosis.  5. The pulmonary veins are not sufficiently imaged to evalution for pulmonary vein thrombus based off of this study. FINDINGS  Left Ventricle: Left ventricular ejection fraction, by estimation, is 60 to 65%. The left ventricle has normal function. The left ventricle demonstrates regional wall motion abnormalities. The left ventricular internal cavity size was normal in size. There is no left ventricular hypertrophy. Left ventricular diastolic parameters are consistent with Grade I diastolic dysfunction (impaired relaxation).  LV Wall Scoring: The basal inferior segment is hypokinetic. Right Ventricle: The right ventricular size is mildly enlarged. No increase in right ventricular wall thickness. Right ventricular systolic function is hyperdynamic. Left Atrium: Left atrial size was normal in size. Right Atrium: Right atrial size was normal in size. Pericardium: There is no evidence of pericardial effusion. Presence of epicardial fat layer. Mitral Valve: Calcified subchordal appatatus and previously describev. The mitral valve is abnormal. Moderate mitral annular calcification. Trivial mitral valve regurgitation. No evidence of mitral valve stenosis. Tricuspid Valve: The tricuspid valve is normal in structure. Tricuspid valve regurgitation is not demonstrated. No evidence of tricuspid stenosis. Aortic Valve: The aortic valve is abnormal. Aortic valve regurgitation is not visualized. Mild aortic stenosis is present. Aortic valve mean gradient  measures 10.0 mmHg. Aortic valve peak gradient measures 21.5 mmHg. Aortic valve area, by VTI measures 1.37 cm. Pulmonic Valve: The pulmonary veins are not sufficiently imaged to evalution for pulmonary vein thrombus based off  of this study. The pulmonic valve was not well visualized. Pulmonic valve regurgitation is not visualized. Aorta: The aortic root and ascending aorta are structurally normal, with no evidence of dilitation. Venous: The pulmonary veins are not sufficiently imaged to evalution for pulmonary vein thrombus based off of this study. IAS/Shunts: No atrial level shunt detected by color flow Doppler.  LEFT VENTRICLE PLAX 2D LVIDd:         4.60 cm   Diastology LVIDs:         3.60 cm   LV e' medial:    8.27 cm/s LV PW:         0.90 cm   LV E/e' medial:  11.0 LV IVS:        1.00 cm   LV e' lateral:   8.81 cm/s LVOT diam:     1.90 cm   LV E/e' lateral: 10.3 LV SV:         61 LV SV Index:   30 LVOT Area:     2.84 cm  RIGHT VENTRICLE RV Basal diam:  2.70 cm RV S prime:     13.80 cm/s TAPSE (M-mode): 3.0 cm LEFT ATRIUM             Index        RIGHT ATRIUM           Index LA diam:        3.70 cm 1.82 cm/m   RA Area:     15.30 cm LA Vol (A2C):   43.1 ml 21.16 ml/m  RA Volume:   38.30 ml  18.80 ml/m LA Vol (A4C):   51.2 ml 25.13 ml/m LA Biplane Vol: 49.5 ml 24.30 ml/m  AORTIC VALVE AV Area (Vmax):    1.16 cm AV Area (Vmean):   1.23 cm AV Area (VTI):     1.37 cm AV Vmax:           232.00 cm/s AV Vmean:          152.000 cm/s AV VTI:            0.448 m AV Peak Grad:      21.5 mmHg AV Mean Grad:      10.0 mmHg LVOT Vmax:         94.85 cm/s LVOT Vmean:        65.750 cm/s LVOT VTI:          0.216 m LVOT/AV VTI ratio: 0.48  AORTA Ao Root diam: 3.50 cm Ao Asc diam:  3.70 cm MITRAL VALVE MV Area (PHT): 4.31 cm     SHUNTS MV Decel Time: 176 msec     Systemic VTI:  0.22 m MV E velocity: 90.70 cm/s   Systemic Diam: 1.90 cm MV A velocity: 116.00 cm/s MV E/A ratio:  0.78 Rudean Haskell MD Electronically signed by Rudean Haskell MD Signature Date/Time: 07/09/2022/1:08:59 PM    Final    MR BRAIN W WO CONTRAST  Result Date: 07/09/2022 CLINICAL DATA:  Study identified as missing a report during evaluation of emergent CTA  chest at 12:27 pm on 07/09/2022. 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 EXAM: MRI HEAD WITHOUT AND WITH CONTRAST TECHNIQUE: Multiplanar, multiecho pulse sequences of the brain and surrounding structures were obtained without  and with intravenous contrast. CONTRAST:  7.5 mL Vueway COMPARISON:  Noncontrast brain MRI 04/03/2021. FINDINGS: Brain: There are multiple small foci of abnormal diffusion scattered in the bilateral cerebral hemispheres, and a left lentiform focus (series 3, image 76 and left periventricular white matter focus (image 82) appear to be restricted. Of note, there were similar scattered small hemispheric infarcts suspected on the previous exam in 2022. No associated hemorrhage or mass effect. Chronically advanced cerebral white matter disease with confluent T2 and FLAIR hyperintensity. Some small areas of chronic white matter cystic encephalomalacia. Chronic bilateral deep gray matter nuclei lacunar infarcts also, progressed since 2022. No definite chronic cerebral blood products. Small if any areas of chronic cortical encephalomalacia. Small homogeneously enhancing left tentorium meningioma is 19 by 15 x 7 mm (AP by transverse by CC) and does not appear significantly changed since 2022. no associated edema or significant mass effect. No other abnormal intracranial enhancement or dural thickening identified. Vascular: Major intracranial vascular flow voids are stable since 2022. Following contrast the major dural venous sinuses are enhancing and appear to be patent. Skull and upper cervical spine: Visualized bone marrow signal is within normal limits. Negative visible cervical spine. Sinuses/Orbits: Stable, negative. Other: Mastoids remain clear. Visible internal auditory structures appear normal. IMPRESSION: 1. Evidence of small recurrent acute or subacute infarcts scattered in both hemispheres, similar to that demonstrated in 2022. No associated  hemorrhage or mass effect. See also emergent Chest CTA report 07/09/2022. 2. Underlying progression of chronic small vessel ischemia since 2022. 3. 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 Suspect this is clinically silent. Electronically Signed   By: Genevie Ann M.D.   On: 07/09/2022 12:38   CT Angio Chest Pulmonary Embolism (PE) W or WO Contrast  Addendum Date: 07/09/2022   ADDENDUM REPORT: 07/09/2022 12:35 ADDENDUM: Of note the patient also underwent a brain MRI of July 08, 2022. Interpretation is pending but preliminarily review with neuroradiology shows stigmata of multiple small infarcts particularly in the LEFT cerebral hemisphere and also likely in the LEFT basal ganglia, most suggestive of embolic phenomenon. This further supports the possibility of pulmonary venous side and LEFT atrial extension of thrombus from RIGHT inferior pulmonary vein. Suggest neurologic and cardiology consultation in this complex patient who also has stigmata of liver disease which is quite severe based on hepatic morphology. Critical Value/emergent results were called by telephone at the time of interpretation on 07/09/2022 at 12:35 pm to provider DAWOOD [facility] , who verbally acknowledged these results. Electronically Signed   By: Zetta Bills M.D.   On: 07/09/2022 12:35   Result Date: 07/09/2022 CLINICAL DATA:  Chest pain, elevated troponin. Rule out pulmonary embolism. EXAM: CT ANGIOGRAPHY CHEST WITH CONTRAST TECHNIQUE: Multidetector CT imaging of the chest was performed using the standard protocol during bolus administration of intravenous contrast. Multiplanar CT image reconstructions and MIPs were obtained to evaluate the vascular anatomy. RADIATION DOSE REDUCTION: This exam was performed according to the departmental dose-optimization program which includes automated exposure control, adjustment of the mA and/or kV according to patient size and/or use of iterative reconstruction technique. CONTRAST:   75m OMNIPAQUE IOHEXOL 350 MG/ML SOLN COMPARISON:  Abdominal imaging from February 25, 2022 FINDINGS: Cardiovascular: Calcified and noncalcified aortic atherosclerotic plaque. No gross acute aortic process though assessment is limited by bolus timing, study performed for evaluation of pulmonary arteries. Heart size is normal without pericardial effusion or nodularity. There is three-vessel coronary artery disease. Main pulmonary artery is opacified to 4 in 16  Hounsfield units. There is no sign of pulmonary embolism. Posterior division inferior pulmonary vein on the RIGHT and inferior pulmonary vein showing variable attenuation. There is peripheral ground-glass in there is non opacification of posterior division of the RIGHT inferior pulmonary vein seen on image 214/6. All other branches of the pulmonary venous system on the RIGHT and LEFT appear well opacified. Low-attenuation extending into the RIGHT inferior pulmonary vein and low attenuation present at the orifice of the vein in the LEFT atrium. Mediastinum/Nodes: Thoracic inlet is unremarkable. No axillary lymphadenopathy. No mediastinal or gross hilar lymphadenopathy with scattered small lymph nodes in the chest. Lungs/Pleura: Patchy ground-glass opacities some areas with distinct nodularity, for example on image 55/7 largest area measuring 7 mm discrete from a more geographic area that measures up to 5 cm in the RIGHT upper lobe. Another area of more discrete ground-glass measuring 12 x 9 mm. Patchy areas of ground-glass attenuation in posterior RIGHT lower lobe in areas subtended by the venous abnormality seen above. There is a discrete RIGHT lower lobe nodule measuring 6 mm (image 55/7) Small nodules also present at the LEFT lung base 6 mm nodule on image 90/7 and a 5 mm nodule on image 94/7. Upper Abdomen: Signs of marked cirrhotic hepatic morphology. Liver not well assessed, studies essentially a noncontrast exam in the abdomen. Spleen is moderately  enlarged and not well evaluated. Imaged portions of the pancreas, adrenal glands and kidneys are unremarkable without acute gastrointestinal process seen in the upper abdomen. Musculoskeletal: No acute bone finding. No destructive bone process. Spinal degenerative changes. Review of the MIP images confirms the above findings. IMPRESSION: 1. No sign of pulmonary embolism. 2. Posterior division inferior pulmonary vein on the RIGHT and inferior pulmonary vein showing lack of opacification. This extends to the orifice of the RIGHT inferior pulmonary vein and perhaps even into the periphery of the LEFT atrium. This is moderately suspicious for thrombus made more suspicious by the normal opacification of other pulmonary venous branches in the chest. Consider cardiology consult confirmation with venous phase assessment of the chest is suggested. 3. Multiple areas of patchy ground-glass and discrete nodularity in the RIGHT upper lobe. Findings favor an infectious or inflammatory etiology. 4. Better defined lower lobe nodules largest at 6 mm. Given the multifocal nature of findings in the chest and concern for infection but some indeterminate more discrete nodular areas with suggest 8-12 week follow-up outside of the acute setting to ensure resolution. 5. Marked hepatic cirrhosis 6. Aortic atherosclerosis and coronary artery disease. Aortic Atherosclerosis (ICD10-I70.0). Critical Value/emergent results were called by telephone at the time of interpretation on 07/09/2022 at 12:20 pm to provider DAWOOD [facility] , who verbally acknowledged these results. Electronically Signed: By: Zetta Bills M.D. On: 07/09/2022 12:20   DG Chest 2 View  Result Date: 07/08/2022 CLINICAL DATA:  Chest pain.  Fever.  Flu symptoms. EXAM: CHEST - 2 VIEW COMPARISON:  03/10/2021 FINDINGS: The heart size and mediastinal contours are within normal limits. Elevation of right hemidiaphragm again noted. Mild asymmetric patchy opacity is seen in the  right upper lobe which appears new, suspicious for pneumonia. Evidence of pleural effusion. IMPRESSION: Mild asymmetric patchy opacity in right upper lobe, suspicious for pneumonia. Recommend continued chest radiographic follow-up to confirm resolution. Electronically Signed   By: Marlaine Hind M.D.   On: 07/08/2022 18:48    Microbiology: Results for orders placed or performed during the hospital encounter of 07/08/22  Resp panel by RT-PCR (RSV, Flu A&B, Covid) Anterior Nasal  Swab     Status: None   Collection Time: 07/08/22  6:08 PM   Specimen: Anterior Nasal Swab  Result Value Ref Range Status   SARS Coronavirus 2 by RT PCR NEGATIVE NEGATIVE Final   Influenza A by PCR NEGATIVE NEGATIVE Final   Influenza B by PCR NEGATIVE NEGATIVE Final    Comment: (NOTE) The Xpert Xpress SARS-CoV-2/FLU/RSV plus assay is intended as an aid in the diagnosis of influenza from Nasopharyngeal swab specimens and should not be used as a sole basis for treatment. Nasal washings and aspirates are unacceptable for Xpert Xpress SARS-CoV-2/FLU/RSV testing.  Fact Sheet for Patients: EntrepreneurPulse.com.au  Fact Sheet for Healthcare Providers: IncredibleEmployment.be  This test is not yet approved or cleared by the [place] FDA and has been authorized for detection and/or diagnosis of SARS-CoV-2 by FDA under an Emergency Use Authorization (EUA). This EUA will remain in effect (meaning this test can be used) for the duration of the COVID-19 declaration under Section 564(b)(1) of the Act, 21 U.S.C. section 360bbb-3(b)(1), unless the authorization is terminated or revoked.     Resp Syncytial Virus by PCR NEGATIVE NEGATIVE Final    Comment: (NOTE) Fact Sheet for Patients: EntrepreneurPulse.com.au  Fact Sheet for Healthcare Providers: IncredibleEmployment.be  This test is not yet approved or cleared by the [place] FDA and has  been authorized for detection and/or diagnosis of SARS-CoV-2 by FDA under an Emergency Use Authorization (EUA). This EUA will remain in effect (meaning this test can be used) for the duration of the COVID-19 declaration under Section 564(b)(1) of the Act, 21 U.S.C. section 360bbb-3(b)(1), unless the authorization is terminated or revoked.  Performed at [facility], [place] [address], [place], [place] [address]   Respiratory (~20 pathogens) panel by PCR     Status: None   Collection Time: 07/08/22  6:08 PM   Specimen: Nasopharyngeal Swab; Respiratory  Result Value Ref Range Status   Adenovirus NOT DETECTED NOT DETECTED Final   Coronavirus 229E NOT DETECTED NOT DETECTED Final    Comment: (NOTE) The Coronavirus on the Respiratory Panel, DOES NOT test for the novel  Coronavirus (2019 nCoV)    Coronavirus HKU1 NOT DETECTED NOT DETECTED Final   Coronavirus NL63 NOT DETECTED NOT DETECTED Final   Coronavirus OC43 NOT DETECTED NOT DETECTED Final   Metapneumovirus NOT DETECTED NOT DETECTED Final   Rhinovirus / Enterovirus NOT DETECTED NOT DETECTED Final   Influenza A NOT DETECTED NOT DETECTED Final   Influenza B NOT DETECTED NOT DETECTED Final   Parainfluenza Virus 1 NOT DETECTED NOT DETECTED Final   Parainfluenza Virus 2 NOT DETECTED NOT DETECTED Final   Parainfluenza Virus 3 NOT DETECTED NOT DETECTED Final   Parainfluenza Virus 4 NOT DETECTED NOT DETECTED Final   Respiratory Syncytial Virus NOT DETECTED NOT DETECTED Final   Bordetella pertussis NOT DETECTED NOT DETECTED Final   Bordetella Parapertussis NOT DETECTED NOT DETECTED Final   Chlamydophila pneumoniae NOT DETECTED NOT DETECTED Final   Mycoplasma pneumoniae NOT DETECTED NOT DETECTED Final    Comment: Performed at [facility], [place]. [address] [place], [place] [address]  Culture, blood (routine x 2)     Status: None (Preliminary result)   Collection Time: 07/08/22 10:24 PM   Specimen: BLOOD  Result Value Ref Range  Status   Specimen Description BLOOD BLOOD RIGHT HAND  Final   Special Requests   Final    BOTTLES DRAWN AEROBIC AND ANAEROBIC Blood Culture results may not be optimal due  to an inadequate volume of blood received in culture bottles   Culture   Final    NO GROWTH 4 DAYS Performed at [facility], [place] [address], [place], [place] [address]    Report Status PENDING  Incomplete  Culture, blood (routine x 2)     Status: None (Preliminary result)   Collection Time: 07/08/22 11:15 PM   Specimen: BLOOD  Result Value Ref Range Status   Specimen Description BLOOD SITE NOT SPECIFIED  Final   Special Requests   Final    BOTTLES DRAWN AEROBIC AND ANAEROBIC Blood Culture results may not be optimal due to an inadequate volume of blood received in culture bottles   Culture   Final    NO GROWTH 4 DAYS Performed at [facility], [place] [address], [place],  [address]    Report Status PENDING  Incomplete    Labs: CBC: Recent Labs  Lab 07/09/22 0456 07/10/22 0742 07/11/22 0043 07/12/22 0048 07/13/22 0908  WBC 10.8* 8.7 7.5 4.8 4.1  NEUTROABS 8.1*  --   --   --   --   HGB 12.7* 13.3 12.6* 11.8* 11.9*  HCT 36.8* 38.3* 36.5* 35.1* 34.6*  MCV 93.2 91.2 91.9 91.9 90.8  PLT 99* 110* 100* 102* 96*   Basic Metabolic Panel: Recent Labs  Lab 07/08/22 2025 07/09/22 0456 07/10/22 0742 07/11/22 0043 07/12/22 0048 07/13/22 0046  NA  --  140 140 137 138 137  K  --  3.6 4.5 4.0 3.8 3.7  CL  --  107 107 107 107 107  CO2  --  25 20* 19* 22 23  GLUCOSE  --  138* 116* 127* 118* 140*  BUN  --  16 12 15 18 17  $ CREATININE  --  1.33* 1.33* 1.46* 1.33* 1.36*  CALCIUM  --  8.8* 8.8* 8.5* 8.5* 8.3*  MG 2.1 2.1  --   --   --   --    Liver Function Tests: Recent Labs  Lab 07/08/22 1811 07/09/22 0456  AST 41 34  ALT 22 20  ALKPHOS 81 72  BILITOT 0.8 0.7  PROT 6.5 5.8*  ALBUMIN 3.7 3.3*   CBG: Recent Labs  Lab 07/12/22 1108 07/12/22 1810 07/12/22 2057 07/13/22 0556  07/13/22 1140  GLUCAP 109* 92 214* 107* 134*    Discharge time spent: 35 minutes.  Signed: Cordelia Poche, MD [facility] 07/13/2022

## 2022-07-13 NOTE — TOC Transition Note (Signed)
Transition of Care [facility]) - CM/SW Discharge Note   Patient Details  Name: Collin Bridges MRN: [id number] Date of Birth: Sep 20, 1945  [facility] of [facility]) CM/SW Contact:  Zenon Mayo, RN Phone Number: 07/13/2022, 1:20 PM   Clinical Narrative:    Patient is for dc today, his girlfriend is at the bedside and will transport him home. He has no needs.           Patient Goals and CMS Choice      Discharge Placement                         Discharge Plan and Services Additional resources added to the After Visit Summary for                                       Social Determinants of Health (SDOH) Interventions SDOH Screenings   Food Insecurity: No Food Insecurity (07/09/2022)  Housing: Low Risk  (07/09/2022)  Transportation Needs: No Transportation Needs (07/09/2022)  Utilities: Not At Risk (07/09/2022)  Depression (PHQ2-9): Low Risk  (06/03/2022)  Tobacco Use: Medium Risk (07/13/2022)     Readmission Risk Interventions     No data to display

## 2022-07-13 NOTE — Progress Notes (Incomplete)
PROGRESS NOTE    Collin Bridges  [id number] DOB: 1946-02-19 DOA: 07/08/2022 PCP: [name], MD   Brief Narrative: No notes on file   Assessment and Plan:  NSTEMI ***  Acute CVA ***  Sepsis ***  *** pneumonia ***  Aortic stenosis ***  Thrombocytopenia Chronic. Mild. Stable. Presumed secondary to known liver disease.  Diabetes mellitus, type 2 ***  Depression ***  Hyperlipidemia ***  Primary hypertension ***  Pulmonary nodule*** ***  Hepatic cirrhosis ***  DVT prophylaxis: *** Code Status:   Code Status: Full Code Family Communication: *** Disposition Plan: ***   Consultants:  ***  Procedures:  ***  Antimicrobials: ***    Subjective: ***  Objective: BP (!) 147/77 (BP Location: Left Arm)   Pulse 68   Temp (!) 97.4 F (36.3 C) (Oral)   Resp 18   Ht 6' (1.829 m)   Wt 82.4 kg   SpO2 94%   BMI 24.64 kg/m   Examination:  General exam: Appears calm and comfortable *** Respiratory system: Clear to auscultation. Respiratory effort normal. Cardiovascular system: S1 & S2 heard, RRR. No murmurs, rubs, gallops or clicks. Gastrointestinal system: Abdomen is nondistended, soft and nontender. No organomegaly or masses felt. Normal bowel sounds heard. Central nervous system: Alert and oriented. No focal neurological deficits. Musculoskeletal: No edema. No calf tenderness Skin: No cyanosis. No rashes Psychiatry: Judgement and insight appear normal. Mood & affect appropriate.    Data Reviewed: I have personally reviewed following labs and imaging studies  CBC Lab Results  Component Value Date   WBC 4.8 07/12/2022   RBC 3.82 (L) 07/12/2022   HGB 11.8 (L) 07/12/2022   HCT 35.1 (L) 07/12/2022   MCV 91.9 07/12/2022   MCH 30.9 07/12/2022   PLT 102 (L) 07/12/2022   MCHC 33.6 07/12/2022   RDW 12.7 07/12/2022   LYMPHSABS 1.7 07/09/2022   MONOABS 0.6 07/09/2022   EOSABS 0.4 07/09/2022   BASOSABS 0.0 07/09/2022      Last metabolic panel Lab Results  Component Value Date   NA 137 07/13/2022   K 3.7 07/13/2022   CL 107 07/13/2022   CO2 23 07/13/2022   BUN 17 07/13/2022   CREATININE 1.36 (H) 07/13/2022   GLUCOSE 140 (H) 07/13/2022   GFRNONAA 54 (L) 07/13/2022   CALCIUM 8.3 (L) 07/13/2022   PROT 5.8 (L) 07/09/2022   ALBUMIN 3.3 (L) 07/09/2022   BILITOT 0.7 07/09/2022   ALKPHOS 72 07/09/2022   AST 34 07/09/2022   ALT 20 07/09/2022   ANIONGAP 7 07/13/2022    GFR: Estimated Creatinine Clearance: 49.9 mL/min (A) (by C-G formula based on SCr of 1.36 mg/dL (H)).  Recent Results (from the past 240 hour(s))  Resp panel by RT-PCR (RSV, Flu A&B, Covid) Anterior Nasal Swab     Status: None   Collection Time: 07/08/22  6:08 PM   Specimen: Anterior Nasal Swab  Result Value Ref Range Status   SARS Coronavirus 2 by RT PCR NEGATIVE NEGATIVE Final   Influenza A by PCR NEGATIVE NEGATIVE Final   Influenza B by PCR NEGATIVE NEGATIVE Final    Comment: (NOTE) The Xpert Xpress SARS-CoV-2/FLU/RSV plus assay is intended as an aid in the diagnosis of influenza from Nasopharyngeal swab specimens and should not be used as a sole basis for treatment. Nasal washings and aspirates are unacceptable for Xpert Xpress SARS-CoV-2/FLU/RSV testing.  Fact Sheet for Patients: EntrepreneurPulse.com.au  Fact Sheet for Healthcare Providers: IncredibleEmployment.be  This test is not yet approved  or cleared by the [place] and has been authorized for detection and/or diagnosis of SARS-CoV-2 by FDA under an Emergency Use Authorization (EUA). This EUA will remain in effect (meaning this test can be used) for the duration of the COVID-19 declaration under Section 564(b)(1) of the Act, 21 U.S.C. section 360bbb-3(b)(1), unless the authorization is terminated or revoked.     Resp Syncytial Virus by PCR NEGATIVE NEGATIVE Final    Comment: (NOTE) Fact Sheet for  Patients: EntrepreneurPulse.com.au  Fact Sheet for Healthcare Providers: IncredibleEmployment.be  This test is not yet approved or cleared by the [place] FDA and has been authorized for detection and/or diagnosis of SARS-CoV-2 by FDA under an Emergency Use Authorization (EUA). This EUA will remain in effect (meaning this test can be used) for the duration of the COVID-19 declaration under Section 564(b)(1) of the Act, 21 U.S.C. section 360bbb-3(b)(1), unless the authorization is terminated or revoked.  Performed at [facility], [place] [address], [place], [place] [address]   Respiratory (~20 pathogens) panel by PCR     Status: None   Collection Time: 07/08/22  6:08 PM   Specimen: Nasopharyngeal Swab; Respiratory  Result Value Ref Range Status   Adenovirus NOT DETECTED NOT DETECTED Final   Coronavirus 229E NOT DETECTED NOT DETECTED Final    Comment: (NOTE) The Coronavirus on the Respiratory Panel, DOES NOT test for the novel  Coronavirus (2019 nCoV)    Coronavirus HKU1 NOT DETECTED NOT DETECTED Final   Coronavirus NL63 NOT DETECTED NOT DETECTED Final   Coronavirus OC43 NOT DETECTED NOT DETECTED Final   Metapneumovirus NOT DETECTED NOT DETECTED Final   Rhinovirus / Enterovirus NOT DETECTED NOT DETECTED Final   Influenza A NOT DETECTED NOT DETECTED Final   Influenza B NOT DETECTED NOT DETECTED Final   Parainfluenza Virus 1 NOT DETECTED NOT DETECTED Final   Parainfluenza Virus 2 NOT DETECTED NOT DETECTED Final   Parainfluenza Virus 3 NOT DETECTED NOT DETECTED Final   Parainfluenza Virus 4 NOT DETECTED NOT DETECTED Final   Respiratory Syncytial Virus NOT DETECTED NOT DETECTED Final   Bordetella pertussis NOT DETECTED NOT DETECTED Final   Bordetella Parapertussis NOT DETECTED NOT DETECTED Final   Chlamydophila pneumoniae NOT DETECTED NOT DETECTED Final   Mycoplasma pneumoniae NOT DETECTED NOT DETECTED Final    Comment: Performed at  [facility], [place]. [address] [place], [place] [address]  Culture, blood (routine x 2)     Status: None (Preliminary result)   Collection Time: 07/08/22 10:24 PM   Specimen: BLOOD  Result Value Ref Range Status   Specimen Description BLOOD BLOOD RIGHT HAND  Final   Special Requests   Final    BOTTLES DRAWN AEROBIC AND ANAEROBIC Blood Culture results may not be optimal due to an inadequate volume of blood received in culture bottles   Culture   Final    NO GROWTH 4 DAYS Performed at [facility], [place] [address], [place], [place] [address]    Report Status PENDING  Incomplete  Culture, blood (routine x 2)     Status: None (Preliminary result)   Collection Time: 07/08/22 11:15 PM   Specimen: BLOOD  Result Value Ref Range Status   Specimen Description BLOOD SITE NOT SPECIFIED  Final   Special Requests   Final    BOTTLES DRAWN AEROBIC AND ANAEROBIC Blood Culture results may not be optimal due to an inadequate volume of blood received in culture bottles   Culture  Final    NO GROWTH 4 DAYS Performed at [facility], [place] [employer] [address] [place], [place] [address]    Report Status PENDING  Incomplete      Radiology Studies: CARDIAC CATHETERIZATION  Result Date: 07/12/2022   Ost LAD lesion is 70% stenosed.  1st Sept lesion is 70% stenosed.   Mid LAD lesion is 50% stenosed with 70% stenosed side branch in 1st Diag.   Mid Cx lesion is 95% stenosed.  1st Mrg lesion is 85% stenosed.   Dist Cx lesion is 85% stenosed.  LPAV lesion is 80% stenosed.   Prox RCA lesion is 95% stenosed.  Mid RCA-1 lesion is 80% stenosed. Mid RCA-2 lesion is 85% stenosed.   LV end diastolic pressure is mildly elevated.   There is no aortic valve stenosis. POST-CATH SUMMARY Severe multivessel CAD involving LAD/D1, LCx, OM1, with extensive disease in small codominant RCA => not favorable from a percutaneous approach. There are distal targets in the LAD, D1, OM1, L PDA/PL 2, however the RCA is likely too small  for bypassing. Mildly elevated LVEDP but relatively normal EF by 2D Echo RECOMMENDATIONS. Return to nursing unit for ongoing care, will need to continue treatment for pneumonia.  From my preop standpoint, it may be best for him to be discharged home and heal from his ongoing illness prior to consideration of CVTS consultation for CABG. Results discussed with Dr. Orson Aloe, MD   CT CORONARY [facility] W/CTA COR W/SCORE Lewanda Rife W/CM &/OR WO/CM  Addendum Date: 07/11/2022   ADDENDUM REPORT: 07/11/2022 16:23 EXAM: OVER-READ INTERPRETATION  CT CHEST The following report is an over-read performed by radiologist Dr. Fonnie Birkenhead [facility] on 07/11/2022. This over-read does not include interpretation of cardiac or coronary anatomy or pathology. The interpretation by the cardiologist is attached. COMPARISON:  CT chest 07/11/2022 and 07/09/2022. FINDINGS: Scout view is unremarkable. Scattered patchy peribronchovascular ground-glass. Mild dependent atelectasis bilaterally. Pulmonary nodules measure up to 5 mm in the left lower lobe (12/32). Liver margin is irregular. IMPRESSION: 1. Peribronchovascular ground-glass, likely due to a mild bronchopneumonia. 2. 5 mm left lower lobe nodule. Please refer to diagnostic CT chest performed the same day for further details and recommendations. 3. Cirrhosis. Electronically Signed   By: Lorin Picket M.D.   On: 07/11/2022 16:23   Result Date: 07/11/2022 CLINICAL DATA:  77 year old with CVA and elevated troponin EXAM: Cardiac/Coronary  CTA TECHNIQUE: The patient was scanned on a [employer]. FINDINGS: A 120 kV prospective scan was triggered in the descending thoracic aorta at 111 HU's. Axial non-contrast 3 mm slices were carried out through the heart. The data set was analyzed on a dedicated work station and scored using the [employer]. Gantry rotation speed was 250 msecs and collimation was .6 mm. 0.8 mg of sl NTG was given. The 3D data set was  reconstructed in 5% intervals of the 67-82 % of the R-R cycle. Diastolic phases were analyzed on a dedicated work station using MPR, MIP and VRT modes. The patient received 80 cc of contrast. Image quality: good Aorta:  Normal size.  Aortic atherosclerosis.  No dissection. Aortic Valve: Dense  calcifications. Coronary Arteries:  Normal coronary origin.  Right dominance. RCA is a large dominant artery that gives rise to PDA and PLA. Proximal vessel is moderately stenosed 50-69% prior to dense calcified plaque that contains severe stenosis 70-99%. Mid to distal vessel is moderately diffusely diseased. PDA is small and poorly filled. Left main  is a large artery that gives rise to LAD and LCX arteries. There is mixed calcified and non calcified distal LM stenosis 0-24% LAD is a large vessel. There is ostial moderate stenosis 50-69%, proximal calcified severe stenosis 70-99% prior to large D1. Mid vessel with high risk low attenuation plaque diffuse. Remainder of LAD wraps around apex. -D1 - large vessel, proximal calcified stenosis moderate to severe. -D2 - small caliber, not well filled. LCX is a non-dominant artery that gives rise to one large OM1 branch. Proximal mixed plaque 30-49% mild. Mid AV groove 99% stenosis. -OM1 - large branch with severe proximal vessel mixed plaque, 70-99% Other findings: Normal pulmonary vein drainage into the left atrium. No evidence of PV thrombosis. Normal left atrial appendage without a thrombus. Normal size of the pulmonary artery. Please see radiology report for non cardiac findings. IMPRESSION: 1. Coronary calcium score of 1963. This was 35 percentile for age and sex matched control. 2.  Normal coronary origin with right dominance. 3. Severe proximal LAD, proximal large OM1, and proximal RCA disease (70-99% stenosis). FFR compatible with multivessel flow limiting disease. 4. Aortic valve calcium score 3505: Compatible with severe aortic stenosis. Echocardiogram 06/02/22 showed mild  aortic stenosis. 5.  Aortic atherosclerosis. 6.  No evidence of pulmonary vein thrombosis. Electronically Signed: By: Candee Furbish M.D. On: 07/11/2022 15:54   CT CORONARY FRACTIONAL FLOW RESERVE FLUID ANALYSIS  Result Date: 07/11/2022 EXAM: FFRCT ANALYSIS FINDINGS: FFRct analysis was performed on the original cardiac CT angiogram dataset. Diagrammatic representation of the FFRct analysis is provided in a separate PDF document in PACS. This dictation was created using the PDF document and an interactive 3D model of the results. 3D model is not available in the EMR/PACS. Normal FFR range is >0.80. 1. Left Main: Normal 2. LAD: Proximal 0.88 after ostial lesion, mid 0.71 abnormal 3. LCX: Proximal OM 0.98, mid 0.80, distal 0.72. AV groove mid 0.70, distal 0.56 4. Ramus: N/a 5. RCA: Proximal 0.77, mid 0.62, distal not modeled. IMPRESSION: 1. Severe flow limiting CAD in LAD, LCX, RCA. Discussed with primary team. Note: These examples are not recommendations of [employer] and only provided as examples of what other customers are doing. Electronically Signed   By: Candee Furbish M.D.   On: 07/11/2022 16:01   CT Chest W Contrast  Result Date: 07/11/2022 CLINICAL DATA:  Chest pain. EXAM: CT CHEST WITH CONTRAST TECHNIQUE: Multidetector CT imaging of the chest was performed during intravenous contrast administration. RADIATION DOSE REDUCTION: This exam was performed according to the departmental dose-optimization program which includes automated exposure control, adjustment of the mA and/or kV according to patient size and/or use of iterative reconstruction technique. CONTRAST:  186m OMNIPAQUE IOHEXOL 350 MG/ML SOLN COMPARISON:  July 09, 2022. FINDINGS: Cardiovascular: Atherosclerosis of thoracic aorta is noted without aneurysm or dissection. Normal cardiac size. No pericardial effusion. Extensive coronary artery calcifications are noted suggesting coronary artery disease. Normal opacification of the right inferior  pulmonary vein is noted; no definite thrombus is noted. Mediastinum/Nodes: No enlarged mediastinal, hilar, or axillary lymph nodes. Thyroid gland, trachea, and esophagus demonstrate no significant findings. Lungs/Pleura: No pneumothorax or pleural effusion is noted. Right upper lobe airspace opacity is noted concerning for pneumonia as described on prior exam. Minimal bibasilar subsegmental atelectasis is noted. Stable 5 mm nodule is noted in left lower lobe best seen on image number 95 of series 4. Upper Abdomen: Hepatic cirrhosis. Musculoskeletal: No chest wall abnormality. No acute or significant osseous findings. IMPRESSION: Stable right upper lobe  airspace opacity is noted concerning for pneumonia. Stable 5 mm nodule is noted in left lower lobe. Although likely benign, if the patient is high-risk, given the morphology and/or location of this nodule a non-contrast chest CT can be considered in 12 months.This recommendation follows the consensus statement: Guidelines for Management of Incidental Pulmonary Nodules Detected on CT Images: From the Fleischner Society 2017; Radiology 2017; 284:228-243. Coronary artery calcifications are noted suggesting coronary artery disease. Hepatic cirrhosis. Aortic Atherosclerosis (ICD10-I70.0). Electronically Signed   By: Marijo Conception M.D.   On: 07/11/2022 13:46   VAS US CAROTID  Result Date: 07/11/2022 Carotid Arterial Duplex Study Patient Name:  Virgel Manifold  Date of Exam:   07/11/2022 Medical Rec #: [id number]              Accession #:    [id number] Date of Birth: 05/12/1946             Patient Gender: M Patient Age:   41 years Exam Location:  [facility] Procedure:      VAS US CAROTID Referring Phys: Egbert Garibaldi --------------------------------------------------------------------------------  Indications:       CVA and CHF, CKD. Risk Factors:      Hypertension, hyperlipidemia, Diabetes. Comparison Study:  No prior study. Performing Technologist:  McKayla Maag RVT, [place]  Examination Guidelines: A complete evaluation includes B-mode imaging, spectral Doppler, color Doppler, and power Doppler as needed of all accessible portions of each vessel. Bilateral testing is considered an integral part of a complete examination. Limited examinations for reoccurring indications may be performed as noted.  Right Carotid Findings: +----------+-------+-------+--------+------------------------+-----------------+           PSV    EDV    StenosisPlaque Description      Comments                    cm/s   cm/s                                                     +----------+-------+-------+--------+------------------------+-----------------+ CCA Prox  60     8                                      intimal                                                                   thickening        +----------+-------+-------+--------+------------------------+-----------------+ CCA Distal50     9                                      intimal  thickening        +----------+-------+-------+--------+------------------------+-----------------+ ICA Prox  57     9      1-39%   irregular and                                                             heterogenous                              +----------+-------+-------+--------+------------------------+-----------------+ ICA Mid   63     19                                                       +----------+-------+-------+--------+------------------------+-----------------+ ICA Distal85     18                                                       +----------+-------+-------+--------+------------------------+-----------------+ ECA       118    13                                                       +----------+-------+-------+--------+------------------------+-----------------+  +----------+--------+-------+----------------+-------------------+           PSV cm/sEDV cmsDescribe        Arm Pressure (mmHG) +----------+--------+-------+----------------+-------------------+ [id number]            Multiphasic, WNL                    +----------+--------+-------+----------------+-------------------+ +---------+--------+--+--------+--+---------+ VertebralPSV cm/s47EDV cm/s13Antegrade +---------+--------+--+--------+--+---------+  Left Carotid Findings: +----------+-------+-------+--------+------------------------+-----------------+           PSV    EDV    StenosisPlaque Description      Comments                    cm/s   cm/s                                                     +----------+-------+-------+--------+------------------------+-----------------+ CCA Prox  108    22                                                       +----------+-------+-------+--------+------------------------+-----------------+ CCA Distal70     16                                     intimal  thickening        +----------+-------+-------+--------+------------------------+-----------------+ ICA Prox  107    25     1-39%   irregular and                                                             heterogenous                              +----------+-------+-------+--------+------------------------+-----------------+ ICA Mid   96     26                                                       +----------+-------+-------+--------+------------------------+-----------------+ ICA Distal140    38                                                       +----------+-------+-------+--------+------------------------+-----------------+ ECA       115    13                                                        +----------+-------+-------+--------+------------------------+-----------------+ +----------+--------+--------+----------------+-------------------+           PSV cm/sEDV cm/sDescribe        Arm Pressure (mmHG) +----------+--------+--------+----------------+-------------------+ [id number]             Multiphasic, WNL                    +----------+--------+--------+----------------+-------------------+ +---------+--------+--+--------+--+---------+ VertebralPSV cm/s44EDV cm/s12Antegrade +---------+--------+--+--------+--+---------+   Summary: Right Carotid: Velocities in the right ICA are consistent with a 1-39% stenosis. Left Carotid: Velocities in the left ICA are consistent with a 1-39% stenosis. Vertebrals:  Bilateral vertebral arteries demonstrate antegrade flow. Subclavians: Normal flow hemodynamics were seen in bilateral subclavian              arteries. *See table(s) above for measurements and observations.  Electronically signed by Antony Contras MD on 07/11/2022 at 12:59:42 PM.    Final       LOS: 5 days    Cordelia Poche, MD [facility] 07/13/2022, 7:25 AM   If 7PM-7AM, please contact night-coverage [email or web address]

## 2022-07-13 NOTE — Discharge Instructions (Signed)
Collin Bridges,  You are in the hospital with stroke and heart attack.  Medications have been adjusted to help with management of your disease processes. Please continue hydralazine, aspirin, Plavix. Your Lipitor dose has been increased. Please follow-up with the neurologist and the cardiologist.

## 2022-07-14 LAB — CULTURE, BLOOD (ROUTINE X 2)
Culture: NO GROWTH
Culture: NO GROWTH

## 2022-07-14 NOTE — Hospital Course (Signed)
Collin Bridges is a 76 y.o. male with a history of chronic diastolic heart failure, aortic stenosis, CKD stage IIIa, diabetes mellitus type 2, hyperlipidemia, hypertension. Patient presented secondary to fever, chills, and chest pain with evidence of pneumonia. He was treated empirically with antibiotics. During workup, he was found to have worsening troponin, concerning for NSTEMI. Cardiology consulted. Transthoracic Echocardiogram obtained was significant for regional wall motion abnormalities of the left ventricle, and subsequent cardiac catheterization was significant for evidence of severe multivessel disease. Decision made to defer CABG referral until outpatient follow-up secondary to concurrent pneumonia. Patient completed antibiotic therapy prior to discharge.

## 2022-07-15 LAB — LIPOPROTEIN A (LPA): Lipoprotein (a): 118.3 nmol/L — ABNORMAL HIGH (ref <75.0)

## 2022-07-18 ENCOUNTER — Encounter: Payer: Self-pay | Admitting: Internal Medicine

## 2022-07-18 ENCOUNTER — Ambulatory Visit (INDEPENDENT_AMBULATORY_CARE_PROVIDER_SITE_OTHER)
Admission: RE | Admit: 2022-07-18 | Discharge: 2022-07-18 | Disposition: A | Discharge status: Home / Self Care | Payer: 59 | Source: Ambulatory Visit | Attending: Internal Medicine | Admitting: Internal Medicine

## 2022-07-18 ENCOUNTER — Ambulatory Visit (INDEPENDENT_AMBULATORY_CARE_PROVIDER_SITE_OTHER): Payer: 59 | Admitting: Internal Medicine

## 2022-07-18 VITALS — BP 134/76 | HR 93 | Temp 97.7°F | Ht 72.0 in | Wt 180.0 lb

## 2022-07-18 DIAGNOSIS — J189 Pneumonia, unspecified organism: Secondary | ICD-10-CM

## 2022-07-18 DIAGNOSIS — I10 Essential (primary) hypertension: Secondary | ICD-10-CM | POA: Diagnosis not present

## 2022-07-18 NOTE — Progress Notes (Signed)
Patient ID: Collin Bridges, male   DOB: 1945-05-21, 77 y.o.   MRN: [id number]       Chief Complaint: follow up post hospn with CAP feb 23 - feb 28       HPI:  Collin Bridges is a 77 y.o. male here overall doing ok;  Patient presented secondary to fever, chills, and chest pain with evidence of pneumonia. He was treated empirically with antibiotics. During workup, he was found to have worsening troponin, concerning for NSTEMI. Cardiology consulted. Transthoracic Echocardiogram obtained was significant for regional wall motion abnormalities of the left ventricle, and subsequent cardiac catheterization was significant for evidence of severe multivessel disease. Decision made to defer CABG referral until outpatient follow-up secondary to concurrent pneumonia. Patient completed antibiotic therapy prior to discharge. Pt still with yellow prod cough today, concerned about persistent pna.  Also with acute cva, has f/u Dr Leonie Man Apr 16.  Has cardiology appt with PA for cardiology Mar 8.    Pt endorses still some mild prod cough, clear sputum without blood, mild sob but no fever, chills, wheezing or chest pain         Transitional Care Management elements noted today: 1)  Date of D/C: as above 2)  Medication reconciliation:  done today at end visit 3)  Review of D/C summary or other information:  done today 4)  Review of need for f/u on pending diagnostic tests and treatments:  done today - none needed 5)  Review of need for Interaction with other providers who will assume or resume care of pt specific problems: done today cardiology mar 8 6)  Education of patient/family/guardian or caregiver: done today - pt has long term male friend with him for support  Wt Readings from Last 3 Encounters:  07/18/22 180 lb (81.6 kg)  07/13/22 181 lb 11.2 oz (82.4 kg)  06/03/22 184 lb (83.5 kg)   BP Readings from Last 3 Encounters:  07/18/22 134/76  07/13/22 139/77  06/03/22 (!) 150/90         Past  Medical History:  Diagnosis Date   Allergy    seasonal   Anxiety    Depression    Headache    Hepatitis C    HLD (hyperlipidemia) 08/09/2017   Hypertension    Hypoglycemia    Stroke [facility])    Past Surgical History:  Procedure Laterality Date   APPENDECTOMY     BUBBLE STUDY  06/16/2021   Procedure: BUBBLE STUDY;  Surgeon: [name], MD;  Location: [place];  Service: Cardiovascular;;   LEFT HEART CATH AND CORONARY ANGIOGRAPHY N/A 07/12/2022   Procedure: LEFT HEART CATH AND CORONARY ANGIOGRAPHY;  Surgeon: Leonie Man, MD;  Location: [place] CV LAB;  Service: Cardiovascular;  Laterality: N/A;   TEE WITHOUT CARDIOVERSION N/A 06/16/2021   Procedure: TRANSESOPHAGEAL ECHOCARDIOGRAM (TEE);  Surgeon: [name], MD;  Location: [place];  Service: Cardiovascular;  Laterality: N/A;   TONSILECTOMY, ADENOIDECTOMY, BILATERAL MYRINGOTOMY AND TUBES      reports that he has quit smoking. He has never used smokeless tobacco. He reports that he does not drink alcohol and does not use drugs. family history includes Hyperlipidemia in his father; Hypertension in his father; Lung disease in his mother. No Known Allergies Current Outpatient Medications on File Prior to Visit  Medication Sig Dispense Refill   aspirin EC 81 MG tablet Take 1 tablet (81 mg total) by mouth daily. Swallow whole. 30 tablet 2   atorvastatin (LIPITOR) 80  MG tablet Take 1 tablet (80 mg total) by mouth daily. 30 tablet 2   cholecalciferol (VITAMIN D3) 25 MCG (1000 UNIT) tablet Take 1 tablet (1,000 Units total) by mouth daily. Take 1,000 Units by mouth daily. 90 tablet 2   clopidogrel (PLAVIX) 75 MG tablet Take 1 tablet (75 mg total) by mouth daily. 90 tablet 2   fenofibrate 160 MG tablet Take 1 tablet (160 mg total) by mouth daily. 90 tablet 3   fluticasone (FLONASE) 50 MCG/ACT nasal spray USE 2 SPRAYS IN EACH NOSTRIL DAILY (Patient taking differently: Place 2 sprays into both nostrils daily. USE 2  SPRAYS IN EACH NOSTRIL DAILY) 48 g 2   gabapentin (NEURONTIN) 300 MG capsule TAKE 1 CAPSULE BY MOUTH EVERY  NIGHT AT BEDTIME 100 capsule 1   hydrALAZINE (APRESOLINE) 25 MG tablet Take 1 tablet (25 mg total) by mouth every 8 (eight) hours. 90 tablet 2   losartan (COZAAR) 100 MG tablet Take 1 tablet (100 mg total) by mouth daily. 90 tablet 3   metFORMIN (GLUCOPHAGE-XR) 500 MG 24 hr tablet Take 1 tablet (500 mg total) by mouth daily with breakfast. 90 tablet 3   Multiple Vitamins-Minerals (HAIR/SKIN/NAILS/BIOTIN PO) Take 1 tablet by mouth daily.     PARoxetine (PAXIL) 40 MG tablet TAKE 1 TABLET(40 MG) BY MOUTH EVERY MORNING (Patient taking differently: Take 40 mg by mouth every morning.) 90 tablet 3   sildenafil (VIAGRA) 100 MG tablet Take 0.5-1 tablets (50-100 mg total) by mouth daily as needed for erectile dysfunction. 5 tablet 11   vitamin E 180 MG (400 UNITS) capsule Take 400 Units by mouth daily.     zolpidem (AMBIEN) 10 MG tablet TAKE 1 TABLET(10 MG) BY MOUTH AT BEDTIME AS NEEDED FOR SLEEP (Patient taking differently: Take 10 mg by mouth See admin instructions. TAKE 1 TABLET(10 MG) BY MOUTH AT BEDTIME AS NEEDED FOR SLEEP) 90 tablet 1   No current facility-administered medications on file prior to visit.        ROS:  All others reviewed and negative.  Objective        PE:  BP 134/76 (BP Location: Left Arm, Patient Position: Sitting, Cuff Size: Large)   Pulse 93   Temp 97.7 F (36.5 C) (Oral)   Ht 6' (1.829 m)   Wt 180 lb (81.6 kg)   SpO2 92%   BMI 24.41 kg/m                 Constitutional: Pt appears in NAD               HENT: Head: NCAT.                Right Ear: External ear normal.                 Left Ear: External ear normal.                Eyes: . Pupils are equal, round, and reactive to light. Conjunctivae and EOM are normal               Nose: without d/c or deformity               Neck: Neck supple. Gross normal ROM               Cardiovascular: Normal rate and regular  rhythm.                 Pulmonary/Chest: Effort normal and breath sounds  without rales or wheezing.                Abd:  Soft, NT, ND, + BS, no organomegaly               Neurological: Pt is alert. At baseline orientation, motor grossly intact               Skin: Skin is warm. No rashes, no other new lesions, LE edema - none               Psychiatric: Pt behavior is normal without agitation   Micro: none  Cardiac tracings I have personally interpreted today:  none  Pertinent Radiological findings (summarize): none   Lab Results  Component Value Date   WBC 4.1 07/13/2022   HGB 11.9 (L) 07/13/2022   HCT 34.6 (L) 07/13/2022   PLT 96 (L) 07/13/2022   GLUCOSE 140 (H) 07/13/2022   CHOL 214 (H) 07/10/2022   TRIG 130 07/10/2022   HDL 48 07/10/2022   LDLDIRECT 146.0 06/03/2022   LDLCALC 140 (H) 07/10/2022   ALT 20 07/09/2022   AST 34 07/09/2022   NA 137 07/13/2022   K 3.7 07/13/2022   CL 107 07/13/2022   CREATININE 1.36 (H) 07/13/2022   BUN 17 07/13/2022   CO2 23 07/13/2022   TSH 0.98 06/03/2022   PSA 3.06 06/03/2022   INR 1.0 04/07/2021   HGBA1C 6.3 (H) 07/09/2022   MICROALBUR <0.7 06/03/2022   Assessment/Plan:  Collin Bridges is a 77 y.o. White or Caucasian [1] male with  has a past medical history of Allergy, Anxiety, Depression, Headache, Hepatitis C, HLD (hyperlipidemia) (08/09/2017), Hypertension, Hypoglycemia, and Stroke ([place]).  CAP (community acquired pneumonia) RUL CAP - clinically much improved with only persistent cough, for cxr, but likely able to continue on with plans per cardiology for further eval and tx.    Essential hypertension BP Readings from Last 3 Encounters:  07/18/22 134/76  07/13/22 139/77  06/03/22 (!) 150/90   Stable, pt to continue medical treatment hdyralazine 25 tid, losartan 100 mg qd  Followup: Return in about 6 months (around 01/18/2023).  [name], MD 07/18/2022 7:47 PM [facility]

## 2022-07-18 NOTE — Assessment & Plan Note (Signed)
RUL CAP - clinically much improved with only persistent cough, for cxr, but likely able to continue on with plans per cardiology for further eval and tx.

## 2022-07-18 NOTE — Assessment & Plan Note (Signed)
BP Readings from Last 3 Encounters:  07/18/22 134/76  07/13/22 139/77  06/03/22 (!) 150/90   Stable, pt to continue medical treatment hdyralazine 25 tid, losartan 100 mg qd

## 2022-07-18 NOTE — Patient Instructions (Addendum)
Please continue all other medications as before, and refills have been done if requested.  Please have the pharmacy call with any other refills you may need.  Please keep your appointments with your specialists as you may have planned  Please go to the XRAY Department in the first floor for the x-ray testing  You will be contacted by phone if any changes need to be made immediately.  Otherwise, you will receive a letter about your results with an explanation, but please check with MyChart first.  Please remember to sign up for MyChart if you have not done so, as this will be important to you in the future with finding out test results, communicating by private email, and scheduling acute appointments online when needed.  Please make an Appointment to return in 6 months, or sooner if needed

## 2022-07-19 ENCOUNTER — Encounter: Payer: Self-pay | Admitting: Internal Medicine

## 2022-07-22 ENCOUNTER — Other Ambulatory Visit: Payer: Self-pay

## 2022-07-22 ENCOUNTER — Encounter: Payer: Self-pay | Admitting: Nurse Practitioner

## 2022-07-22 ENCOUNTER — Encounter: Payer: Self-pay | Admitting: Internal Medicine

## 2022-07-22 ENCOUNTER — Ambulatory Visit: Payer: 59 | Attending: Nurse Practitioner | Admitting: Nurse Practitioner

## 2022-07-22 ENCOUNTER — Other Ambulatory Visit: Payer: Self-pay | Admitting: Nurse Practitioner

## 2022-07-22 VITALS — BP 120/62 | HR 94 | Ht 72.0 in | Wt 181.9 lb

## 2022-07-22 DIAGNOSIS — I350 Nonrheumatic aortic (valve) stenosis: Secondary | ICD-10-CM

## 2022-07-22 DIAGNOSIS — I25118 Atherosclerotic heart disease of native coronary artery with other forms of angina pectoris: Secondary | ICD-10-CM

## 2022-07-22 DIAGNOSIS — N1831 Chronic kidney disease, stage 3a: Secondary | ICD-10-CM

## 2022-07-22 DIAGNOSIS — E1165 Type 2 diabetes mellitus with hyperglycemia: Secondary | ICD-10-CM

## 2022-07-22 DIAGNOSIS — I3481 Nonrheumatic mitral (valve) annulus calcification: Secondary | ICD-10-CM

## 2022-07-22 DIAGNOSIS — I10 Essential (primary) hypertension: Secondary | ICD-10-CM | POA: Diagnosis not present

## 2022-07-22 DIAGNOSIS — E785 Hyperlipidemia, unspecified: Secondary | ICD-10-CM

## 2022-07-22 DIAGNOSIS — Z8673 Personal history of transient ischemic attack (TIA), and cerebral infarction without residual deficits: Secondary | ICD-10-CM

## 2022-07-22 DIAGNOSIS — J189 Pneumonia, unspecified organism: Secondary | ICD-10-CM

## 2022-07-22 MED ORDER — ISOSORBIDE MONONITRATE ER 30 MG PO TB24: 15.0000 mg | ORAL_TABLET | Freq: Every day | ORAL | 3 refills | Status: DC

## 2022-07-22 MED ORDER — NITROGLYCERIN 0.4 MG SL SUBL: 0.4000 mg | SUBLINGUAL_TABLET | SUBLINGUAL | 3 refills | Status: DC | PRN

## 2022-07-22 NOTE — Progress Notes (Addendum)
Office Visit    Patient Name: Collin Bridges Date of Encounter: 07/22/2022  Primary Care Provider:  Biagio Borg, MD Primary Cardiologist:  Evalina Field, MD  Chief Complaint    77 year old male with a history of mutlivessel CAD s/p NSTEMI in 06/2022, calcified mitral valve tissue (TEE done to rule out MV mass), mild aortic stenosis, hypertension, hyperlipidemia, CVA, type 2 diabetes, CKD stage III, and community-acquired pneumoniawho presents for post-hospital follow-up related to CAD.  Past Medical History    Past Medical History:  Diagnosis Date   Allergy    seasonal   Anxiety    Depression    Headache    Hepatitis C    HLD (hyperlipidemia) 08/09/2017   Hypertension    Hypoglycemia    Stroke [facility])    Past Surgical History:  Procedure Laterality Date   APPENDECTOMY     BUBBLE STUDY  06/16/2021   Procedure: BUBBLE STUDY;  Surgeon: [name], MD;  Location: [place];  Service: Cardiovascular;;   LEFT HEART CATH AND CORONARY ANGIOGRAPHY N/A Jul 30, 2022   Procedure: LEFT HEART CATH AND CORONARY ANGIOGRAPHY;  Surgeon: Leonie Man, MD;  Location: [place] CV LAB;  Service: Cardiovascular;  Laterality: N/A;   TEE WITHOUT CARDIOVERSION N/A 06/16/2021   Procedure: TRANSESOPHAGEAL ECHOCARDIOGRAM (TEE);  Surgeon: [name], MD;  Location: [place];  Service: Cardiovascular;  Laterality: N/A;   TONSILECTOMY, ADENOIDECTOMY, BILATERAL MYRINGOTOMY AND TUBES      Allergies  No Known Allergies   Labs/Other Studies Reviewed    The following studies were reviewed today: LHC 2022/07/30:    Ost LAD lesion is 70% stenosed.  1st Sept lesion is 70% stenosed.   Mid LAD lesion is 50% stenosed with 70% stenosed side branch in 1st Diag.   Mid Cx lesion is 95% stenosed.  1st Mrg lesion is 85% stenosed.   Dist Cx lesion is 85% stenosed.  LPAV lesion is 80% stenosed.   Prox RCA lesion is 95% stenosed.  Mid RCA-1 lesion is 80% stenosed. Mid RCA-2  lesion is 85% stenosed.   LV end diastolic pressure is mildly elevated.   There is no aortic valve stenosis.   POST-CATH SUMMARY Severe multivessel CAD involving LAD/D1, LCx, OM1, with extensive disease in small codominant RCA => not favorable from a percutaneous approach. There are distal targets in the LAD, D1, OM1, L PDA/PL 2, however the RCA is likely too small for bypassing. Mildly elevated LVEDP but relatively normal EF by 2D Echo     RECOMMENDATIONS. Return to nursing unit for ongoing care, will need to continue treatment for pneumonia.  From my preop standpoint, it may be best for him to be discharged home and heal from his ongoing illness prior to consideration of CVTS consultation for CABG.   Echo 07/09/2022: IMPRESSIONS   1. Left ventricular ejection fraction, by estimation, is 60 to 65%. The left ventricle has normal function. The left ventricle demonstrates regional wall motion abnormalities (see scoring diagram/findings for description).  The basal inferior segment is hypokinetic.Left ventricular diastolic parameters are consistent with Grade I diastolic dysfunction (impaired relaxation).   2. Right ventricular systolic function is hyperdynamic. The right  ventricular size is mildly enlarged.   3. Calcified subchordal appatatus and previously describev. The mitral valve is abnormal. Trivial mitral valve regurgitation. No evidence of mitral stenosis. Moderate mitral annular calcification.   4. The aortic valve is abnormal. Aortic valve regurgitation is not  visualized. Mild aortic valve stenosis.  5. The pulmonary veins are not sufficiently imaged to evalution for pulmonary vein thrombus based off of this study.   Recent Labs: 06/03/2022: TSH 0.98 07/09/2022: ALT 20; Magnesium 2.1 07/13/2022: BUN 17; Creatinine, Ser 1.36; Hemoglobin 11.9; Platelets 96; Potassium 3.7; Sodium 137  Recent Lipid Panel    Component Value Date/Time   CHOL 214 (H) 07/10/2022 0742   TRIG 130  07/10/2022 0742   HDL 48 07/10/2022 0742   CHOLHDL 4.5 07/10/2022 0742   VLDL 26 07/10/2022 0742   LDLCALC 140 (H) 07/10/2022 0742   LDLDIRECT 146.0 06/03/2022 0849    History of Present Illness    77 year old male with the above past medical history including mutlivessel CAD s/p NSTEMI in 06/2022,  calcified mitral valve tissue (TEE done to rule out MV mass), mild aortic stenosis, hypertension, hyperlipidemia, CVA, type 2 diabetes, CKD stage III, and community-acquired pneumonia.  He was initially evaluated by cardiology in 05/2021 for follow-up related to echocardiogram that showed possible mitral valve mass.  He was last seen in the office on 05/24/2021.  Follow-up TEE revealed calcified mitral valve tissue, no further evaluation was recommended.  He presented to the ED on 07/08/2022 with severe sepsis secondary to community-acquired pneumonia.  He noted chest pain.  Troponin was elevated. Cardiology was consulted.  Echocardiogram showed EF 60 to 65%, hypokinesis of the basal inferior segment, G1 DD, moderate MAC, mild aortic valve stenosis.  Coronary CTA with multivessel CAD with FFR positive mid LAD, mid to distal left circumflex, proximal RCA.  He underwent cardiac catheterization on 07/12/2022 which revealed multivessel CAD involving the LAD/D1, LCx, OM1, with extensive disease in small codominant RCA => not favorable from a percutaneous approach.  CT surgery consult for consideration of CABG was recommended, however, was felt that patient would benefit from recovery from his acute infection prior to surgery.  For, surgery consultation was deferred to the outpatient setting.  MRI/A of the head during admission showed scattered bilateral subcortical strokes thought to be in the setting of small vessel disease, stable 1 x 2 mm anterior communicating artery aneurysm (he has a history of prior CVA, follows with neurology).  He was discharged home in stable condition on 07/13/2022.    He presents today for  follow-up accompanied by his girlfriend. Since his hospitalization he has been stable from a cardiac standpoint. Has noted some dyspnea on exertion, intermittent chest discomfort. He denies any edema, PND, orthopnea, weight gain. He is eager to speak with a cardiothoracic surgeon to discuss possible CABG.   Home Medications    Current Outpatient Medications  Medication Sig Dispense Refill   aspirin EC 81 MG tablet Take 1 tablet (81 mg total) by mouth daily. Swallow whole. 30 tablet 2   atorvastatin (LIPITOR) 80 MG tablet Take 1 tablet (80 mg total) by mouth daily. 30 tablet 2   cholecalciferol (VITAMIN D3) 25 MCG (1000 UNIT) tablet Take 1 tablet (1,000 Units total) by mouth daily. Take 1,000 Units by mouth daily. 90 tablet 2   clopidogrel (PLAVIX) 75 MG tablet Take 1 tablet (75 mg total) by mouth daily. 90 tablet 2   fenofibrate 160 MG tablet Take 1 tablet (160 mg total) by mouth daily. 90 tablet 3   fluticasone (FLONASE) 50 MCG/ACT nasal spray USE 2 SPRAYS IN EACH NOSTRIL DAILY (Patient taking differently: Place 2 sprays into both nostrils daily. USE 2 SPRAYS IN EACH NOSTRIL DAILY) 48 g 2   gabapentin (NEURONTIN) 300 MG capsule TAKE 1 CAPSULE BY MOUTH EVERY  NIGHT AT BEDTIME 100 capsule 1   hydrALAZINE (APRESOLINE) 25 MG tablet Take 1 tablet (25 mg total) by mouth every 8 (eight) hours. 90 tablet 2   isosorbide mononitrate (IMDUR) 30 MG 24 hr tablet Take 0.5 tablets (15 mg total) by mouth daily. 45 tablet 3   losartan (COZAAR) 100 MG tablet Take 1 tablet (100 mg total) by mouth daily. 90 tablet 3   metFORMIN (GLUCOPHAGE-XR) 500 MG 24 hr tablet Take 1 tablet (500 mg total) by mouth daily with breakfast. 90 tablet 3   Multiple Vitamins-Minerals (HAIR/SKIN/NAILS/BIOTIN PO) Take 1 tablet by mouth daily.     nitroGLYCERIN (NITROSTAT) 0.4 MG SL tablet Place 1 tablet (0.4 mg total) under the tongue every 5 (five) minutes as needed for chest pain. 25 tablet 3   PARoxetine (PAXIL) 40 MG tablet TAKE 1  TABLET(40 MG) BY MOUTH EVERY MORNING (Patient taking differently: Take 40 mg by mouth every morning.) 90 tablet 3   sildenafil (VIAGRA) 100 MG tablet Take 0.5-1 tablets (50-100 mg total) by mouth daily as needed for erectile dysfunction. 5 tablet 11   vitamin E 180 MG (400 UNITS) capsule Take 400 Units by mouth daily.     zolpidem (AMBIEN) 10 MG tablet TAKE 1 TABLET(10 MG) BY MOUTH AT BEDTIME AS NEEDED FOR SLEEP (Patient taking differently: Take 10 mg by mouth See admin instructions. TAKE 1 TABLET(10 MG) BY MOUTH AT BEDTIME AS NEEDED FOR SLEEP) 90 tablet 1   No current facility-administered medications for this visit.     Review of Systems    He denies palpitations, pnd, orthopnea, n, v, dizziness, syncope, edema, weight gain, or early satiety. All other systems reviewed and are otherwise negative except as noted above.    Cardiac Rehabilitation Eligibility Assessment      Physical Exam    VS:  BP 120/62   Pulse 94   Ht 6' (1.829 m)   Wt 181 lb 14.4 oz (82.5 kg)   SpO2 98%   BMI 24.67 kg/m   GEN: Well nourished, well developed, in no acute distress. HEENT: normal. Neck: Supple, no JVD, carotid bruits, or masses. Cardiac: RRR, no murmurs, rubs, or gallops. No clubbing, cyanosis, edema.  Radials/DP/PT 2+ and equal bilaterally. R radial cath site with minimal bruising, no bleeding, bruit or hematoma. Respiratory:  Respirations regular and unlabored, clear to auscultation bilaterally. GI: Soft, nontender, nondistended, BS + x 4. MS: no deformity or atrophy. Skin: warm and dry, no rash. Neuro:  Strength and sensation are intact. Psych: Normal affect.  Accessory Clinical Findings    ECG personally reviewed by me today -NSR, 94 bpm nonspecific ST/T wave abnormality- no acute changes.   Lab Results  Component Value Date   WBC 4.1 07/13/2022   HGB 11.9 (L) 07/13/2022   HCT 34.6 (L) 07/13/2022   MCV 90.8 07/13/2022   PLT 96 (L) 07/13/2022   Lab Results  Component Value Date    CREATININE 1.36 (H) 07/13/2022   BUN 17 07/13/2022   NA 137 07/13/2022   K 3.7 07/13/2022   CL 107 07/13/2022   CO2 23 07/13/2022   Lab Results  Component Value Date   ALT 20 07/09/2022   AST 34 07/09/2022   ALKPHOS 72 07/09/2022   BILITOT 0.7 07/09/2022   Lab Results  Component Value Date   CHOL 214 (H) 07/10/2022   HDL 48 07/10/2022   LDLCALC 140 (H) 07/10/2022   LDLDIRECT 146.0 06/03/2022   TRIG 130 07/10/2022   CHOLHDL 4.5  07/10/2022    Lab Results  Component Value Date   HGBA1C 6.3 (H) 07/09/2022    Assessment & Plan    1. CAD: Cath on 07/12/2022 revealed multivessel CAD involving the LAD/D1, LCx, OM1, with extensive disease in small codominant RCA => not favorable from a percutaneous approach.  CT surgery consult for consideration of CABG was recommended, however, was felt that patient would benefit from recovery from his acute infection prior to surgery. Pneumonia has resolved.  He notes stable dyspnea on exertion, intermittent chest discomfort.  Will start Imdur 15 mg daily.  Provided prescription for nitroglycerin.  Discussed ED precautions.  Advised him not to take Viagra.  Will place stat referral to CT surgery.  Continue aspirin, Plavix, hydralazine, losartan, Lipitor, fenofibrate.  2. Valvular heart disease: H/o of possible mitral valve mass, follow-up TEE revealed calcified tissue, no further evaluation was recommended.  Most recent echo in 06/2022 showed EF 60 to 65%, hypokinesis of the basal inferior segment, G1 DD, moderate MAC, mild aortic valve stenosis, stable. Euvolemic and well compensated on exam.   3. Hypertension: BP well controlled. Continue current antihypertensive regimen.   4. Hyperlipidemia: LDL was 140 in 06/2022.  Lipitor was recently increased to 80 mg daily.  Will repeat fasting lipid panel, LFTs in 6 to 8 weeks.  If LDL remains elevated above goal, will likely need referral to lipid clinic Pharm.D. for consideration of PCSK9 inhibitor. Continue  Lipitor, fenofibrate.   5. History of CVA: Following with neurology.  Aspirin, Plavix, Lipitor.  6. Type 2 diabetes: A1c was 6.3 in 06/2022. Monitored and managed per PCP.   7. CKD stage III: Creatinine was stable at 1.36 on 06/2022.  8. Community-acquired pneumonia: Resolved.   9. Disposition: Follow-up in 3 months, sooner if needed.       Lenna Sciara, NP 07/22/2022, 12:34 PM

## 2022-07-22 NOTE — Patient Instructions (Addendum)
Medication Instructions:  Start Imdur 15 mg daily Nitroglycerin 0.4 mg take as needed for chest pain.   *If you need a refill on your cardiac medications before your next appointment, please call your pharmacy*   Lab Work: Your physician recommends that you return for lab work in 6-8 weeks. Fasting lipid panel & LFTs  If you have labs (blood work) drawn today and your tests are completely normal, you will receive your results only by: MyChart Message (if you have MyChart) OR A paper copy in the mail If you have any lab test that is abnormal or we need to change your treatment, we will call you to review the results.   Testing/Procedures: NONE ordered at this time of appointment     Follow-Up: At [facility], you and your health needs are our priority.  As part of our continuing mission to provide you with exceptional heart care, we have created designated Provider Care Teams.  These Care Teams include your primary Cardiologist (physician) and Advanced Practice Providers (APPs -  Physician Assistants and Nurse Practitioners) who all work together to provide you with the care you need, when you need it.  We recommend signing up for the patient portal called "MyChart".  Sign up information is provided on this After Visit Summary.  MyChart is used to connect with patients for Virtual Visits (Telemedicine).  Patients are able to view lab/test results, encounter notes, upcoming appointments, etc.  Non-urgent messages can be sent to your provider as well.   To learn more about what you can do with MyChart, go to [email or web address].    Your next appointment:   3 month(s)  Provider:   Evalina Field, MD     Other Instructions Referral sent to CT surgery.

## 2022-07-24 NOTE — Progress Notes (Unsigned)
[address]       [place] [address]             [id number]           Collin Bridges [facility] Medical Record [id number] Date of Birth: [birth year]  Biagio Borg, MD Biagio Borg, MD  Chief Complaint: Recent pneumonia and NSTEMI with CAD   History of Present Illness:     Pt is a 77yo male who was admitted in February with sepsis and community aquired pneumonia. He was found to have some chest pain and elevated troponin and was found on echo to have normal LV function with some regional wall motion issues and Cath performed with severe 3 VCAD. Pt was also found to have possibly new CVA by MRI along with meningioma unchanged. Pt was optimized and sent home to recover prior to consideration for CABG. Pt denies CP currently but does get SOB with activity. He occasionally looses his memory and feels its the lack of blood flow to brain. He has been seen by Cardiology since discharge and was felt still weak requiring more rehab prior to surgery. Pt has DM and he describes inherited hypoglycemia. He has CRI with Stage III disease. He is currently living with his girlfriend and still with coughing up phlegm but no fevers.      Past Medical History:  Diagnosis Date   Allergy    seasonal   Anxiety    Depression    Headache    Hepatitis C    HLD (hyperlipidemia) 08/09/2017   Hypertension    Hypoglycemia    Stroke [facility])     Past Surgical History:  Procedure Laterality Date   APPENDECTOMY     BUBBLE STUDY  06/16/2021   Procedure: BUBBLE STUDY;  Surgeon: [name], MD;  Location: [facility];  Service: Cardiovascular;;   LEFT HEART CATH AND CORONARY ANGIOGRAPHY N/A 07/12/2022   Procedure: LEFT HEART CATH AND CORONARY ANGIOGRAPHY;  Surgeon: Leonie Man, MD;  Location: [facility];  Service: Cardiovascular;  Laterality: N/A;   TEE WITHOUT CARDIOVERSION N/A 06/16/2021   Procedure: TRANSESOPHAGEAL ECHOCARDIOGRAM (TEE);  Surgeon: [name], MD;  Location: [facility];  Service: Cardiovascular;  Laterality: N/A;   TONSILECTOMY, ADENOIDECTOMY, BILATERAL MYRINGOTOMY AND TUBES      Social History   Tobacco Use  Smoking Status Former  Smokeless Tobacco Never  Tobacco Comments   quit 17yr ago, smoke a cigar occassionally.    Social History   Substance and Sexual Activity  Alcohol Use No    Social History   Socioeconomic History   Marital status: Single    Spouse name: Not on file   Number of children: 0   Years of education: some college   Highest education level: Not on file  Occupational History   Occupation: retired  Tobacco Use   Smoking status: Former   Smokeless tobacco: Never   Tobacco comments:    quit [address]yr[address], smoke a cigar occassionally.  Substance and Sexual Activity   Alcohol use: No   Drug use: No   Sexual activity: Yes  Other Topics Concern   Not on file  Social History Narrative   Lives alone.   Right-handed.   Two cups caffeine per day.   Social Determinants of Health   Financial Resource Strain: Not on file  Food Insecurity: No Food Insecurity (07/09/2022)   Hunger Vital Sign    Worried About  Running Out of Food in the Last Year: Never true    [place] in the Last Year: Never true  Transportation Needs: No Transportation Needs (07/09/2022)   PRAPARE - Hydrologist (Medical): No    Lack of Transportation (Non-Medical): No  Physical Activity: Not on file  Stress: Not on file  Social Connections: Not on file  Intimate Partner Violence: Not At Risk (07/09/2022)   Humiliation, Afraid, Rape, and Kick questionnaire    Fear of Current or Ex-Partner: No    Emotionally Abused: No    Physically Abused: No    Sexually Abused: No    No Known Allergies  Current Outpatient Medications  Medication Sig Dispense Refill   aspirin EC 81 MG tablet Take 1 tablet (81 mg total) by mouth daily. Swallow whole. 30 tablet 2   atorvastatin (LIPITOR)  80 MG tablet Take 1 tablet (80 mg total) by mouth daily. 30 tablet 2   cholecalciferol (VITAMIN D3) 25 MCG (1000 UNIT) tablet Take 1 tablet (1,000 Units total) by mouth daily. Take 1,000 Units by mouth daily. 90 tablet 2   clopidogrel (PLAVIX) 75 MG tablet Take 1 tablet (75 mg total) by mouth daily. 90 tablet 2   fenofibrate 160 MG tablet Take 1 tablet (160 mg total) by mouth daily. 90 tablet 3   fluticasone (FLONASE) 50 MCG/ACT nasal spray USE 2 SPRAYS IN EACH NOSTRIL DAILY (Patient taking differently: Place 2 sprays into both nostrils daily. USE 2 SPRAYS IN EACH NOSTRIL DAILY) 48 g 2   gabapentin (NEURONTIN) 300 MG capsule TAKE 1 CAPSULE BY MOUTH EVERY  NIGHT AT BEDTIME 100 capsule 1   hydrALAZINE (APRESOLINE) 25 MG tablet Take 1 tablet (25 mg total) by mouth every 8 (eight) hours. 90 tablet 2   isosorbide mononitrate (IMDUR) 30 MG 24 hr tablet Take 0.5 tablets (15 mg total) by mouth daily. 45 tablet 3   losartan (COZAAR) 100 MG tablet Take 1 tablet (100 mg total) by mouth daily. 90 tablet 3   metFORMIN (GLUCOPHAGE-XR) 500 MG 24 hr tablet Take 1 tablet (500 mg total) by mouth daily with breakfast. 90 tablet 3   Multiple Vitamins-Minerals (HAIR/SKIN/NAILS/BIOTIN PO) Take 1 tablet by mouth daily.     nitroGLYCERIN (NITROSTAT) 0.4 MG SL tablet Place 1 tablet (0.4 mg total) under the tongue every 5 (five) minutes as needed for chest pain. 25 tablet 3   PARoxetine (PAXIL) 40 MG tablet TAKE 1 TABLET(40 MG) BY MOUTH EVERY MORNING (Patient taking differently: Take 40 mg by mouth every morning.) 90 tablet 3   sildenafil (VIAGRA) 100 MG tablet Take 0.5-1 tablets (50-100 mg total) by mouth daily as needed for erectile dysfunction. 5 tablet 11   vitamin E 180 MG (400 UNITS) capsule Take 400 Units by mouth daily.     zolpidem (AMBIEN) 10 MG tablet TAKE 1 TABLET(10 MG) BY MOUTH AT BEDTIME AS NEEDED FOR SLEEP (Patient taking differently: Take 10 mg by mouth See admin instructions. TAKE 1 TABLET(10 MG) BY MOUTH AT  BEDTIME AS NEEDED FOR SLEEP) 90 tablet 1   No current facility-administered medications for this visit.     Family History  Problem Relation Age of Onset   Lung disease Mother    Hyperlipidemia Father    Hypertension Father        Physical Exam: Looks washed out sitting on exam table Lungs: overall clear Card: RR Ext: warm but no edema Neuro: distant affect with some difficulty with  remembering what occurred recently in hospital     Diagnostic Studies & Laboratory data: I have personally reviewed the following studies and agree with the findings   Cardiac Cath (06/2022) Conclusion      Ost LAD lesion is 70% stenosed.  1st Sept lesion is 70% stenosed.   Mid LAD lesion is 50% stenosed with 70% stenosed side branch in 1st Diag.   Mid Cx lesion is 95% stenosed.  1st Mrg lesion is 85% stenosed.   Dist Cx lesion is 85% stenosed.  LPAV lesion is 80% stenosed.   Prox RCA lesion is 95% stenosed.  Mid RCA-1 lesion is 80% stenosed. Mid RCA-2 lesion is 85% stenosed.   LV end diastolic pressure is mildly elevated.   There is no aortic valve stenosis.   POST-CATH SUMMARY Severe multivessel CAD involving LAD/D1, LCx, OM1, with extensive disease in small codominant RCA => not favorable from a percutaneous approach. There are distal targets in the LAD, D1, OM1, L PDA/PL 2, however the RCA is likely too small for bypassing. Mildly elevated LVEDP but relatively normal EF by 2D Echo   TTE (06/2022) FINDINGS   Left Ventricle: Left ventricular ejection fraction, by estimation, is 60  to 65%. The left ventricle has normal function. The left ventricle  demonstrates regional wall motion abnormalities. The left ventricular  internal cavity size was normal in size.  There is no left ventricular hypertrophy. Left ventricular diastolic  parameters are consistent with Grade I diastolic dysfunction (impaired  relaxation).     LV Wall Scoring:  The basal inferior segment is hypokinetic.    Right Ventricle: The right ventricular size is mildly enlarged. No  increase in right ventricular wall thickness. Right ventricular systolic  function is hyperdynamic.   Left Atrium: Left atrial size was normal in size.   Right Atrium: Right atrial size was normal in size.   Pericardium: There is no evidence of pericardial effusion. Presence of  epicardial fat layer.   Mitral Valve: Calcified subchordal appatatus and previously describev. The  mitral valve is abnormal. Moderate mitral annular calcification. Trivial  mitral valve regurgitation. No evidence of mitral valve stenosis.   Tricuspid Valve: The tricuspid valve is normal in structure. Tricuspid  valve regurgitation is not demonstrated. No evidence of tricuspid  stenosis.   Aortic Valve: The aortic valve is abnormal. Aortic valve regurgitation is  not visualized. Mild aortic stenosis is present. Aortic valve mean  gradient measures 10.0 mmHg. Aortic valve peak gradient measures 21.5  mmHg. Aortic valve area, by VTI measures  1.37 cm.   Pulmonic Valve: The pulmonary veins are not sufficiently imaged to  evalution for pulmonary vein thrombus based off of this study. The  pulmonic valve was not well visualized. Pulmonic valve regurgitation is  not visualized.   Aorta: The aortic root and ascending aorta are structurally normal, with  no evidence of dilitation.   Venous: The pulmonary veins are not sufficiently imaged to evalution for  pulmonary vein thrombus based off of this study.   IAS/Shunts: No atrial level shunt detected by color flow Doppler.     LEFT VENTRICLE  PLAX 2D  LVIDd:         4.60 cm   Diastology  LVIDs:         3.60 cm   LV e' medial:    8.27 cm/s  LV PW:         0.90 cm   LV E/e' medial:  11.0  LV IVS:  1.00 cm   LV e' lateral:   8.81 cm/s  LVOT diam:     1.90 cm   LV E/e' lateral: 10.3  LV SV:         61  LV SV Index:   30  LVOT Area:     2.84 cm     RIGHT VENTRICLE  RV Basal  diam:  2.70 cm  RV S prime:     13.80 cm/s  TAPSE (M-mode): 3.0 cm   LEFT ATRIUM             Index        RIGHT ATRIUM           Index  LA diam:        3.70 cm 1.82 cm/m   RA Area:     15.30 cm  LA Vol (A2C):   43.1 ml 21.16 ml/m  RA Volume:   38.30 ml  18.80 ml/m  LA Vol (A4C):   51.2 ml 25.13 ml/m  LA Biplane Vol: 49.5 ml 24.30 ml/m   AORTIC VALVE  AV Area (Vmax):    1.16 cm  AV Area (Vmean):   1.23 cm  AV Area (VTI):     1.37 cm  AV Vmax:           232.00 cm/s  AV Vmean:          152.000 cm/s  AV VTI:            0.448 m  AV Peak Grad:      21.5 mmHg  AV Mean Grad:      10.0 mmHg  LVOT Vmax:         94.85 cm/s  LVOT Vmean:        65.750 cm/s  LVOT VTI:          0.216 m  LVOT/AV VTI ratio: 0.48    AORTA  Ao Root diam: 3.50 cm  Ao Asc diam:  3.70 cm   MITRAL VALVE  MV Area (PHT): 4.31 cm     SHUNTS  MV Decel Time: 176 msec     Systemic VTI:  0.22 m  MV E velocity: 90.70 cm/s   Systemic Diam: 1.90 cm  MV A velocity: 116.00 cm/s  MV E/A ratio:  0.78     Recent Radiology Findings:   CT of Chest (06/2022) FINDINGS: Cardiovascular: Atherosclerosis of thoracic aorta is noted without aneurysm or dissection. Normal cardiac size. No pericardial effusion. Extensive coronary artery calcifications are noted suggesting coronary artery disease. Normal opacification of the right inferior pulmonary vein is noted; no definite thrombus is noted.   Mediastinum/Nodes: No enlarged mediastinal, hilar, or axillary lymph nodes. Thyroid gland, trachea, and esophagus demonstrate no significant findings.   Lungs/Pleura: No pneumothorax or pleural effusion is noted. Right upper lobe airspace opacity is noted concerning for pneumonia as described on prior exam. Minimal bibasilar subsegmental atelectasis is noted. Stable 5 mm nodule is noted in left lower lobe best seen on image number 95 of series 4.   Upper Abdomen: Hepatic cirrhosis.   Musculoskeletal: No chest wall  abnormality. No acute or significant osseous findings.   IMPRESSION: Stable right upper lobe airspace opacity is noted concerning for pneumonia.   Stable 5 mm nodule is noted in left lower lobe. Although likely benign, if the patient is high-risk, given the morphology and/or location of this nodule a non-contrast chest CT can be considered in 12 months.This recommendation follows the consensus statement: Guidelines for  Management of Incidental Pulmonary Nodules Detected on CT Images: From the Fleischner Society 2017; Radiology 2017; 284:228-243.   Coronary artery calcifications are noted suggesting coronary artery disease.   Hepatic cirrhosis.   Aortic Atherosclerosis (ICD10-I70.0).      Recent Lab Findings: Lab Results  Component Value Date   WBC 4.1 07/13/2022   HGB 11.9 (L) 07/13/2022   HCT 34.6 (L) 07/13/2022   PLT 96 (L) 07/13/2022   GLUCOSE 140 (H) 07/13/2022   CHOL 214 (H) 07/10/2022   TRIG 130 07/10/2022   HDL 48 07/10/2022   LDLDIRECT 146.0 06/03/2022   LDLCALC 140 (H) 07/10/2022   ALT 20 07/09/2022   AST 34 07/09/2022   NA 137 07/13/2022   K 3.7 07/13/2022   CL 107 07/13/2022   CREATININE 1.36 (H) 07/13/2022   BUN 17 07/13/2022   CO2 23 07/13/2022   TSH 0.98 06/03/2022   INR 1.0 04/07/2021   HGBA1C 6.3 (H) 07/09/2022      Assessment / Plan:     77 yo male with recent NSTEMI following sepsis from pneumonia and has DM, acute on chronic CVA with vascular disease of brain, CRI stage III and with significant deconditioning. I feel currently that optimizing medical therapy for CAD best course currently as he continues to recover from admission. Will add low dose BB to management. I feel that reconsideration for CABG if fails medical management at this point and reconsideration after seen by cardiology in several months. Pt understands and agrees.   I have spent 60 min in review of the records, viewing studies and in face to face with patient and in  coordination of future care    Coralie Common 07/24/2022 8:30 PM

## 2022-07-25 ENCOUNTER — Institutional Professional Consult (permissible substitution) (INDEPENDENT_AMBULATORY_CARE_PROVIDER_SITE_OTHER): Payer: 59 | Admitting: Thoracic Surgery (Cardiothoracic Vascular Surgery)

## 2022-07-25 VITALS — BP 111/56 | HR 90 | Resp 20 | Ht 72.0 in | Wt 180.0 lb

## 2022-07-25 DIAGNOSIS — I2510 Atherosclerotic heart disease of native coronary artery without angina pectoris: Secondary | ICD-10-CM

## 2022-07-25 MED ORDER — METOPROLOL TARTRATE 25 MG PO TABS: 12.5000 mg | ORAL_TABLET | Freq: Two times a day (BID) | ORAL | 1 refills | Status: DC

## 2022-07-25 NOTE — Patient Instructions (Signed)
Metoprolol 12.'5mg'$  bid Follow up with Cardiology in June Call if symptoms worsen over that time

## 2022-07-26 ENCOUNTER — Ambulatory Visit: Payer: 59 | Admitting: Physician Assistant

## 2022-07-26 ENCOUNTER — Encounter: Payer: Self-pay | Admitting: Internal Medicine

## 2022-08-08 MED ORDER — GABAPENTIN 300 MG PO CAPS
300.0000 mg | ORAL_CAPSULE | Freq: Every day | ORAL | 3 refills | Status: DC
  Filled 2023-01-23: qty 90, 90d supply, fill #0
  Filled 2023-04-14: qty 90, 90d supply, fill #1
  Filled 2023-07-10: qty 90, 90d supply, fill #2

## 2022-08-08 MED ORDER — CLOPIDOGREL BISULFATE 75 MG PO TABS: 75.0000 mg | ORAL_TABLET | Freq: Every day | ORAL | 3 refills | Status: DC

## 2022-08-08 MED ORDER — METFORMIN HCL ER 500 MG PO TB24: 500.0000 mg | ORAL_TABLET | Freq: Every day | ORAL | 3 refills | Status: DC

## 2022-08-08 MED ORDER — LOSARTAN POTASSIUM 100 MG PO TABS: 100.0000 mg | ORAL_TABLET | Freq: Every day | ORAL | 3 refills | Status: DC

## 2022-08-08 NOTE — Addendum Note (Signed)
Addended by: Earnstine Regal on: 08/08/2022 03:59 PM   Modules accepted: Orders

## 2022-08-08 NOTE — Telephone Encounter (Signed)
Called pt to verify mychart message. Pt states he need refills on Losartan, Gabapentin, Metformin and Plavix. All need to be sent to [facility]/ [address]. Sent refills to [facility]../l;mb

## 2022-08-10 ENCOUNTER — Other Ambulatory Visit: Payer: Self-pay | Admitting: Internal Medicine

## 2022-08-15 ENCOUNTER — Other Ambulatory Visit: Payer: Self-pay | Admitting: Internal Medicine

## 2022-08-18 ENCOUNTER — Telehealth: Payer: Self-pay | Admitting: Cardiovascular Disease

## 2022-08-18 NOTE — Telephone Encounter (Signed)
Returned call to pt he states that he had to cancel his cataract surgeon appt and will have to call them back to reschedule. Informed pt to have their office fax over a cardiac clearance request. Verbalized understanding.

## 2022-08-18 NOTE — Telephone Encounter (Signed)
Patient wants to talk with Dr. Audie Box or nurse in regards to his cataract.

## 2022-08-18 NOTE — Telephone Encounter (Signed)
Pt calling back to make nurse aware that he will not be having Cataract surgery any longer. Please advise

## 2022-08-26 LAB — LIPID PANEL
Chol/HDL Ratio: 3.4 ratio (ref 0.0–5.0)
Cholesterol, Total: 165 mg/dL (ref 100–199)
HDL: 48 mg/dL (ref >39)
LDL Chol Calc (NIH): 89 mg/dL (ref 0–99)
Triglycerides: 159 mg/dL — ABNORMAL HIGH (ref 0–149)
VLDL Cholesterol Cal: 28 mg/dL (ref 5–40)

## 2022-08-26 LAB — HEPATIC FUNCTION PANEL
ALT: 25 IU/L (ref 0–44)
AST: 29 IU/L (ref 0–40)
Albumin: 4.7 g/dL (ref 3.8–4.8)
Alkaline Phosphatase: 95 IU/L (ref 44–121)
Bilirubin Total: 0.7 mg/dL (ref 0.0–1.2)
Bilirubin, Direct: 0.23 mg/dL (ref 0.00–0.40)
Total Protein: 7 g/dL (ref 6.0–8.5)

## 2022-08-28 ENCOUNTER — Other Ambulatory Visit: Payer: Self-pay | Admitting: Internal Medicine

## 2022-08-30 ENCOUNTER — Ambulatory Visit (INDEPENDENT_AMBULATORY_CARE_PROVIDER_SITE_OTHER): Payer: 59 | Admitting: Neurology

## 2022-08-30 ENCOUNTER — Encounter: Payer: Self-pay | Admitting: Neurology

## 2022-08-30 VITALS — BP 119/56 | HR 70 | Ht 72.0 in | Wt 180.2 lb

## 2022-08-30 DIAGNOSIS — I6381 Other cerebral infarction due to occlusion or stenosis of small artery: Secondary | ICD-10-CM | POA: Diagnosis not present

## 2022-08-30 DIAGNOSIS — G3184 Mild cognitive impairment, so stated: Secondary | ICD-10-CM

## 2022-08-30 DIAGNOSIS — Z8673 Personal history of transient ischemic attack (TIA), and cerebral infarction without residual deficits: Secondary | ICD-10-CM

## 2022-08-30 NOTE — Progress Notes (Signed)
[facility] [address] [place]. [place] [address] [phone]       OFFICE FOLLOW-UP NOTE  [name] Date of Birth:  09-19-1945 Medical Record Number:  [id number]   HPI: Initial visit 07/22/2021 :[name] is a pleasant 77 year old Caucasian male initial office follow-up visit following initial hospital consultation visit for stroke in November 2022.  History is obtained from the patient and review of electronic medical records.  I have also personally reviewed available pertinent imaging films in  PACS.Collin Bridges is a 77 y.o. male with a medical history significant for anxiety, depression, hepatitis C, chronic kidney disease stage IIIa, hyperlipidemia, essential hypertension who presented to the ED 11/23 after being sent by his primary care provider due to abnormal MRI report results.  Patient states that he fell on March 10, 2021 in the shower after he slipped sustaining a laceration requiring sutures to the right frontal scalp.  He states he his neck paralyzed from neck down for couple of hours and could not move call for help for a few hours but fortunately recovered.At that time CT imaging revealed a small meningioma below the anterior aspect of the tentorium on the left and was discharged with recommendation for MRI follow-up.  Patient had MRI brain outpatient on 11/19 with results on 11/22 revealing subcentimeter subacute bilateral cerebral infarcts with a 12 mm mass along the left tentorium compatible with a meningioma without mass-effect or edema.  MRI imaging also revealed severe chronic small vessel ischemic disease and patient was asked by his primary provider to come to the ED for further evaluation and work-up of suspected stroke. At baseline patient lives independently, states he does not have any living family members and he does not drive.  He is able to complete his ADLs independently and walks without a walker.  Patient states that he  has been on 325 mg of aspirin for approximately 30 years.  He also endorses blurry vision after falling that resolved after 5 days.  MRI scan of the brain showed tiny suspected subcentimeter cerebellar, left parietal and right frontal subcortical white matter acute infarcts.  12 mm left tentorium likely meningioma.  There was severe changes of chronic small vessel disease.  MRA of the neck was unremarkable.  MRA of the brain with degenerative right posterior cerebral artery aplastic versus occluded A1 segment of the right.  2D echo showed ejection fraction 60 to 65% with suspected calcific beneath the  mitral valve.  LDL cholesterol was 110 mg percent and A1c was 5.8.  Patient was started on dual antiplatelet therapy aspirin and Plavix tolerating well with only minor bruising.   He has not had any recurrent TIA or stroke symptoms.  His blood pressure is under good control.  He is tolerating Lipitor well without muscle aches and pains.  He had outpatient TEE on 06/16/2021 which confirmed calcified chordal tissue under the mitral valve apparatus.  Likely a benign finding.  Redundancy of the interatrial septum but no right-to-left shunt noted.  There was grade 3 aortic.arch plaque noted.  No clot or PFO seen. Update 02/14/2022 : He returns for follow-up after last visit 6 months ago.  Patient states is doing well and had no recurrent stroke or TIA symptoms.  Remains on Plavix which is tolerating well without bruising or bleeding.  He states his blood pressure continues to be high and is working with his primary care physician and today it is elevated at 176/76 in office.  He is  tolerating Lipitor well without side effects.  Last lipid profile on 12/01/2021 showed total cholesterol 158, triglycerides 269, HDL 31 and LDL 126.  Sugars also remain high and last hemoglobin A1c 2 months ago was 7.5.  Patient has new complaints of short-term memory difficulties which he states is for several months now.  He has difficulty  remembering names.  But may remember them sometimes later.  Living with his girlfriend but is mostly independent actives of daily living.  Is responsible for his own medications.  Given up driving years ago.  There is no family history of dementia.  He denies any headache, seizures, head injury with loss of consciousness.  He has not yet had any work-up for reversible causes of memory loss.  He has a small left tentorial meningioma and is now due for surveillance repeat MRI Updated 05/26/2022 : He returns for follow-up after last visit 3 months ago.  He states he is doing well and his short-term memory difficulties are unchanged..  I ordered MRI scan of the brain at last visit for unclear reasons that has not been done.  EEG done on 02/28/2021 was normal.  Lab work on 02/14/2022 showed normal vitamin B12, TSH, RPR and homocystine.  Headache With left foot infected and he is currently on antibiotics for that.  Recurrent stroke or TIA symptoms.  He is tolerating Plavix well without bruising or bleeding.  Blood pressure is normally under good control though today it is elevated at 161/77.  States his sugars and good control.No new complaints today Update 08/30/2022 : .   He returns for follow-up after last visit 3 months ago.  He states even has had a rough last few months results of pneumonia and being diagnosed with multivessel coronary artery disease requiring cardiac surgery soon.  He was admitted in February with fever and chills and feeling weak and diagnosed with pneumonia.  He was also having chest pain on the right side and some shortness of breath.  Troponins were elevated and cardiology was consulted.  MRI scan showed small acute to subacute infarcts in the left basal ganglia, right frontal and left occipital periventricular white matter likely lacunar infarcts from small vessel disease.  He has no new focal motor deficits related to these.  There is unchanged appearance of the small left tentorial  meningioma.  MR angiogram of the brain showed no significant large vessel stenosis.  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  LDL cholesterol was 140 mg percent and hemoglobin A1c was 6.3.  CT scan of the chest showed right upper lobe opacity consistent with pneumonia.  CT angiogram of the coronaries showed severe flow-limiting coronary artery disease in the left anterior descending, left circumflex and right coronary arteries.  Patient has been started on aspirin and Plavix and is scheduled to undergo cardiac bypass surgery soon.  He states his memory difficulties are unchanged.  On the MoCA testing today scored 22/36 is unchanged from last visit.  He still feels weak and has low stamina.  Still feels he is getting over his pneumonia.  Still has a weak cough and does have intermittent coughing. ROS:   14 system review of systems is positive for left ankle pain, memory loss, difficulty remembering names gait imbalance, falls, bruising, dizziness and all other systems negative  PMH:  Past Medical History:  Diagnosis Date   Allergy    seasonal   Anxiety    Depression    Headache  Hepatitis C    HLD (hyperlipidemia) 08/09/2017   Hypertension    Hypoglycemia    Stroke     Social History:  Social History   Socioeconomic History   Marital status: Single    Spouse name: Not on file   Number of children: 0   Years of education: some college   Highest education level: Not on file  Occupational History   Occupation: retired  Tobacco Use   Smoking status: Former   Smokeless tobacco: Never   Tobacco comments:    quit 58yrs ago, smoke a cigar occassionally.  Substance and Sexual Activity   Alcohol use: No   Drug use: No   Sexual activity: Yes  Other Topics Concern   Not on file  Social History Narrative   Lives alone.   Right-handed.   Two cups caffeine per day.   Social Determinants of Health   Financial Resource Strain: Not on file  Food  Insecurity: No Food Insecurity (07/09/2022)   Hunger Vital Sign    Worried About Running Out of Food in the Last Year: Never true    Ran Out of Food in the Last Year: Never true  Transportation Needs: No Transportation Needs (07/09/2022)   PRAPARE - Administrator, Civil Service (Medical): No    Lack of Transportation (Non-Medical): No  Physical Activity: Not on file  Stress: Not on file  Social Connections: Not on file  Intimate Partner Violence: Not At Risk (07/09/2022)   Humiliation, Afraid, Rape, and Kick questionnaire    Fear of Current or Ex-Partner: No    Emotionally Abused: No    Physically Abused: No    Sexually Abused: No    Medications:   Current Outpatient Medications on File Prior to Visit  Medication Sig Dispense Refill   aspirin EC 81 MG tablet Take 1 tablet (81 mg total) by mouth daily. Swallow whole. 30 tablet 2   atorvastatin (LIPITOR) 80 MG tablet Take 1 tablet (80 mg total) by mouth daily. 30 tablet 2   cholecalciferol (VITAMIN D3) 25 MCG (1000 UNIT) tablet Take 1 tablet (1,000 Units total) by mouth daily. Take 1,000 Units by mouth daily. 90 tablet 2   clopidogrel (PLAVIX) 75 MG tablet Take 1 tablet (75 mg total) by mouth daily. 90 tablet 3   fenofibrate 160 MG tablet Take 1 tablet (160 mg total) by mouth daily. 90 tablet 3   fluticasone (FLONASE) 50 MCG/ACT nasal spray USE 2 SPRAYS IN EACH NOSTRIL DAILY (Patient taking differently: Place 2 sprays into both nostrils daily. USE 2 SPRAYS IN EACH NOSTRIL DAILY) 48 g 2   gabapentin (NEURONTIN) 300 MG capsule Take 1 capsule (300 mg total) by mouth at bedtime. 90 capsule 3   hydrALAZINE (APRESOLINE) 25 MG tablet Take 1 tablet (25 mg total) by mouth every 8 (eight) hours. 90 tablet 2   isosorbide mononitrate (IMDUR) 30 MG 24 hr tablet Take 0.5 tablets (15 mg total) by mouth daily. 45 tablet 3   losartan (COZAAR) 100 MG tablet Take 1 tablet (100 mg total) by mouth daily. 90 tablet 3   metFORMIN (GLUCOPHAGE-XR) 500  MG 24 hr tablet Take 1 tablet (500 mg total) by mouth daily with breakfast. 90 tablet 3   metoprolol tartrate (LOPRESSOR) 25 MG tablet Take 0.5 tablets (12.5 mg total) by mouth 2 (two) times daily. 60 tablet 1   Multiple Vitamins-Minerals (HAIR/SKIN/NAILS/BIOTIN PO) Take 1 tablet by mouth daily.     nitroGLYCERIN (NITROSTAT) 0.4 MG SL  tablet Place 1 tablet (0.4 mg total) under the tongue every 5 (five) minutes as needed for chest pain. 25 tablet 3   PARoxetine (PAXIL) 40 MG tablet TAKE 1 TABLET(40 MG) BY MOUTH EVERY MORNING (Patient taking differently: Take 40 mg by mouth every morning.) 90 tablet 3   vitamin E 180 MG (400 UNITS) capsule Take 400 Units by mouth daily.     zolpidem (AMBIEN) 10 MG tablet TAKE 1 TABLET(10 MG) BY MOUTH AT BEDTIME AS NEEDED FOR SLEEP (Patient taking differently: Take 10 mg by mouth See admin instructions. TAKE 1 TABLET(10 MG) BY MOUTH AT BEDTIME AS NEEDED FOR SLEEP) 90 tablet 1   sildenafil (VIAGRA) 100 MG tablet Take 0.5-1 tablets (50-100 mg total) by mouth daily as needed for erectile dysfunction. (Patient not taking: Reported on 08/30/2022) 5 tablet 11   No current facility-administered medications on file prior to visit.    Allergies:  No Known Allergies  Physical Exam General: well developed, well nourished, pleasant elderly Caucasian male seated, in no evident distress Head: head normocephalic and atraumatic.  Neck: supple with no carotid or supraclavicular bruits Cardiovascular: regular rate and rhythm, no murmurs Musculoskeletal: no deformity Skin:  no rash/petichiae Vascular:  Normal pulses all extremities Vitals:   08/30/22 1357  BP: (!) 119/56  Pulse: 70   Neurologic Exam Mental Status: Awake and fully alert. Oriented to place and time. Recent and remote memory intact. Attention span, concentration and fund of knowledge appropriate. Mood and affect appropriate.  Diminished recall 0/3.   Cranial Nerves: Fundoscopic exam not done pupils equal, briskly  reactive to light. Extraocular movements full without nystagmus. Visual fields full to confrontation. Hearing intact. Facial sensation intact. Face, tongue, palate moves normally and symmetrically.  Motor: Normal bulk and tone. Normal strength in all tested extremity muscles. Sensory.: intact to touch ,pinprick .position and vibratory sensation.  Coordination: Rapid alternating movements normal in all extremities. Finger-to-nose and heel-to-shin performed accurately bilaterally. Gait and Station: Arises from chair without difficulty. Stance is normal. Gait demonstrates normal stride length and balance . Able to heel, toe and tandem walk with moderate difficulty.  Reflexes: 1+ and symmetric. Toes downgoing.     08/30/2022    2:11 PM 02/14/2022    8:51 AM  Montreal Cognitive Assessment   Visuospatial/ Executive (0/5) 4 3  Naming (0/3) 3 3  Attention: Read list of digits (0/2) 1 2  Attention: Read list of letters (0/1) 1 1  Attention: Serial 7 subtraction starting at 100 (0/3) 3 3  Language: Repeat phrase (0/2) 2 1  Language : Fluency (0/1) 0 1  Abstraction (0/2) 2 2  Delayed Recall (0/5) 0 0  Orientation (0/6) 6 6  Total 22 22      ASSESSMENT: 77 year old Caucasian male with bicerebral tiny subcortical silent lacunar stroke from small vessel disease.  Vascular risk factors of hypertension and hyperlipidemia.  New complaints of memory loss due to mild cognitive impairment.  History of small left tentorial meningioma and small 1 to 2 mm ACOM aneurysm which appear stable.     PLAN: I had a long discussion with the patient and his wife regarding his recent lacunar strokes and mild cognitive impairment and discussed about stroke prevention and treatment and answered questions.  We also discussed his mild cognitive impairment which appears stable .  Continue aspirin and Plavix given his significant coronary artery disease and aggressive risk factor modification with strict control of  hypertension with blood pressure goal below 140/90, lipids with LDL  cholesterol goal below 70 mg percent and see goal below 6.5%..  I encouraged him to increase participation in cognitively challenging activities like solving crossword puzzles, doing word searches, playing bridge and other cognitively challenging activities.  We also discussed memory compensation strategies.  He will return for follow-up in the future in 6 months or call earlier if necessary.. Greater than 50% of time during this  35 minute visit was spent on counseling,explanation of diagnosis of lacunar stroke, memory loss and mild cognitive impairment as well as lacunar strokes, planning of further management, discussion with patient and family and coordination of care Delia Heady, MD Note: This document was prepared with digital dictation and possible smart phrase technology. Any transcriptional errors that result from this process are unintentional

## 2022-08-30 NOTE — Patient Instructions (Signed)
I had a long discussion with the patient and his wife regarding his recent lacunar strokes and mild cognitive impairment and discussed about stroke prevention and treatment and answered questions.  We also discussed his mild cognitive impairment which appears stable .  Continue aspirin and Plavix given his significant coronary artery disease and aggressive risk factor modification with strict control of hypertension with blood pressure goal below 140/90, lipids with LDL cholesterol goal below 70 mg percent and see goal below 6.5%..  I encouraged him to increase participation in cognitively challenging activities like solving crossword puzzles, doing word searches, playing bridge and other cognitively challenging activities.  We also discussed memory compensation strategies.  He will return for follow-up in the future in 6 months or call earlier if necessary.Marland Kitchen

## 2022-08-31 ENCOUNTER — Telehealth: Payer: Self-pay

## 2022-08-31 DIAGNOSIS — E785 Hyperlipidemia, unspecified: Secondary | ICD-10-CM

## 2022-08-31 NOTE — Telephone Encounter (Signed)
Spoke with pt. Pt was notified of lab results. Pt is aware that a referral was placed to the lipid clinic pharm D and an appointment will be scheduled with them. Pt will continue current medications and f/u as planned.

## 2022-09-02 ENCOUNTER — Telehealth: Payer: Self-pay | Admitting: Cardiovascular Disease

## 2022-09-02 NOTE — Telephone Encounter (Signed)
Attempted to call patient, left message for patient to call back to office.   

## 2022-09-02 NOTE — Telephone Encounter (Signed)
Patient would like a call back to discuss further as to why it is that he needs a pharm-D appt and any medication change. Please advise.

## 2022-09-07 NOTE — Telephone Encounter (Signed)
Call patient and goes straight to VM.  LM to call our office.

## 2022-09-09 NOTE — Telephone Encounter (Signed)
Call goes straight to voicemail.  LVM that if he still has questions to please call our office

## 2022-09-14 ENCOUNTER — Other Ambulatory Visit: Payer: Self-pay | Admitting: Internal Medicine

## 2022-09-14 ENCOUNTER — Encounter: Payer: Self-pay | Admitting: Gastroenterology

## 2022-09-22 ENCOUNTER — Encounter: Payer: Self-pay | Admitting: Internal Medicine

## 2022-09-22 MED ORDER — ZOLPIDEM TARTRATE 10 MG PO TABS: ORAL_TABLET | ORAL | 1 refills | Status: DC

## 2022-09-22 NOTE — Telephone Encounter (Signed)
Done erx 

## 2022-09-23 ENCOUNTER — Telehealth: Payer: Self-pay

## 2022-09-26 NOTE — Telephone Encounter (Signed)
[place], Please see previous message and contact the patent;  Thank you Bre

## 2022-09-26 NOTE — Telephone Encounter (Signed)
Patient has been scheduled for office visit. Thank you.

## 2022-09-29 ENCOUNTER — Ambulatory Visit (INDEPENDENT_AMBULATORY_CARE_PROVIDER_SITE_OTHER): Payer: 59

## 2022-09-29 ENCOUNTER — Ambulatory Visit (INDEPENDENT_AMBULATORY_CARE_PROVIDER_SITE_OTHER): Payer: 59 | Admitting: Family Medicine

## 2022-09-29 VITALS — BP 144/66 | HR 80 | Temp 97.6°F | Ht 72.0 in | Wt 180.0 lb

## 2022-09-29 DIAGNOSIS — R222 Localized swelling, mass and lump, trunk: Secondary | ICD-10-CM

## 2022-09-29 DIAGNOSIS — S3981XA Other specified injuries of abdomen, initial encounter: Secondary | ICD-10-CM

## 2022-09-29 NOTE — Progress Notes (Signed)
Subjective:     Patient ID: Collin Bridges, male    DOB: 1945/06/30, 77 y.o.   MRN: [id number]  Chief Complaint  Patient presents with   Fall    Slid off side of friends bed about 2 weeks ago and now has bruising as well as "something poking out"    HPI Patient is in today for mass of left abdominal wall since falling and scraping the area on a bed frame. Denies pain. No fever, chills, dizziness, chest pain, palpitations, shortness of breath, N/V/D.  He is on Plavix.   Denies hitting his head. No LOC.    Health Maintenance Due  Topic Date Due   Medicare Annual Wellness (AWV)  Never done   Zoster Vaccines- Shingrix (2 of 2) 06/13/2022   OPHTHALMOLOGY EXAM  07/22/2022    Past Medical History:  Diagnosis Date   Allergy    seasonal   Anxiety    Depression    Headache    Hepatitis C    HLD (hyperlipidemia) 08/09/2017   Hypertension    Hypoglycemia    Stroke [facility])     Past Surgical History:  Procedure Laterality Date   APPENDECTOMY     BUBBLE STUDY  06/16/2021   Procedure: BUBBLE STUDY;  Surgeon: Sande Rives, MD;  Location: [facility] ENDOSCOPY;  Service: Cardiovascular;;   LEFT HEART CATH AND CORONARY ANGIOGRAPHY N/A 07/12/2022   Procedure: LEFT HEART CATH AND CORONARY ANGIOGRAPHY;  Surgeon: Marykay Lex, MD;  Location: [facility] INVASIVE CV LAB;  Service: Cardiovascular;  Laterality: N/A;   TEE WITHOUT CARDIOVERSION N/A 06/16/2021   Procedure: TRANSESOPHAGEAL ECHOCARDIOGRAM (TEE);  Surgeon: Sande Rives, MD;  Location: [facility] ENDOSCOPY;  Service: Cardiovascular;  Laterality: N/A;   TONSILECTOMY, ADENOIDECTOMY, BILATERAL MYRINGOTOMY AND TUBES      Family History  Problem Relation Age of Onset   Lung disease Mother    Hyperlipidemia Father    Hypertension Father     Social History   Socioeconomic History   Marital status: Single    Spouse name: Not on file   Number of children: 0   Years of education: some college   Highest education level:  Associate degree: occupational, Scientist, product/process development, or vocational program  Occupational History   Occupation: retired  Tobacco Use   Smoking status: Former   Smokeless tobacco: Never   Tobacco comments:    quit 50yrs ago, smoke a cigar occassionally.  Substance and Sexual Activity   Alcohol use: No   Drug use: No   Sexual activity: Yes  Other Topics Concern   Not on file  Social History Narrative   Lives alone.   Right-handed.   Two cups caffeine per day.   Social Determinants of Health   Financial Resource Strain: Low Risk  (09/29/2022)   Overall Financial Resource Strain (CARDIA)    Difficulty of Paying Living Expenses: Not hard at all  Food Insecurity: No Food Insecurity (09/29/2022)   Hunger Vital Sign    Worried About Running Out of Food in the Last Year: Never true    Ran Out of Food in the Last Year: Never true  Transportation Needs: No Transportation Needs (09/29/2022)   PRAPARE - Administrator, Civil Service (Medical): No    Lack of Transportation (Non-Medical): No  Physical Activity: Insufficiently Active (09/29/2022)   Exercise Vital Sign    Days of Exercise per Week: 1 day    Minutes of Exercise per Session: 10 min  Stress: No Stress Concern  Present (09/29/2022)   [employer] of Occupational Health - Occupational Stress Questionnaire    Feeling of Stress : Not at all  Social Connections: Socially Isolated (09/29/2022)   Social Connection and Isolation Panel [NHANES]    Frequency of Communication with Friends and Family: More than three times a week    Frequency of Social Gatherings with Friends and Family: Once a week    Attends Religious Services: Never    Database administrator or Organizations: No    Attends Engineer, structural: Not on file    Marital Status: Divorced  Intimate Partner Violence: Not At Risk (07/09/2022)   Humiliation, Afraid, Rape, and Kick questionnaire    Fear of Current or Ex-Partner: No    Emotionally Abused: No     Physically Abused: No    Sexually Abused: No    Outpatient Medications Prior to Visit  Medication Sig Dispense Refill   aspirin EC 81 MG tablet Take 1 tablet (81 mg total) by mouth daily. Swallow whole. 30 tablet 2   atorvastatin (LIPITOR) 80 MG tablet Take 1 tablet (80 mg total) by mouth daily. 30 tablet 2   cholecalciferol (VITAMIN D3) 25 MCG (1000 UNIT) tablet Take 1 tablet (1,000 Units total) by mouth daily. Take 1,000 Units by mouth daily. 90 tablet 2   clopidogrel (PLAVIX) 75 MG tablet TAKE 1 TABLET BY MOUTH DAILY 60 tablet 5   fenofibrate 160 MG tablet Take 1 tablet (160 mg total) by mouth daily. 90 tablet 3   fluticasone (FLONASE) 50 MCG/ACT nasal spray USE 2 SPRAYS IN EACH NOSTRIL DAILY (Patient taking differently: Place 2 sprays into both nostrils daily. USE 2 SPRAYS IN EACH NOSTRIL DAILY) 48 g 2   gabapentin (NEURONTIN) 300 MG capsule Take 1 capsule (300 mg total) by mouth at bedtime. 90 capsule 3   hydrALAZINE (APRESOLINE) 25 MG tablet Take 1 tablet (25 mg total) by mouth every 8 (eight) hours. 90 tablet 2   isosorbide mononitrate (IMDUR) 30 MG 24 hr tablet Take 0.5 tablets (15 mg total) by mouth daily. 45 tablet 3   losartan (COZAAR) 100 MG tablet Take 1 tablet (100 mg total) by mouth daily. 90 tablet 3   metFORMIN (GLUCOPHAGE-XR) 500 MG 24 hr tablet TAKE 1 TABLET BY MOUTH ONCE  DAILY WITH BREAKFAST 60 tablet 5   metoprolol tartrate (LOPRESSOR) 25 MG tablet Take 0.5 tablets (12.5 mg total) by mouth 2 (two) times daily. 60 tablet 1   Multiple Vitamins-Minerals (HAIR/SKIN/NAILS/BIOTIN PO) Take 1 tablet by mouth daily.     nitroGLYCERIN (NITROSTAT) 0.4 MG SL tablet Place 1 tablet (0.4 mg total) under the tongue every 5 (five) minutes as needed for chest pain. 25 tablet 3   PARoxetine (PAXIL) 40 MG tablet TAKE 1 TABLET(40 MG) BY MOUTH EVERY MORNING (Patient taking differently: Take 40 mg by mouth every morning.) 90 tablet 3   sildenafil (VIAGRA) 100 MG tablet Take 0.5-1 tablets (50-100  mg total) by mouth daily as needed for erectile dysfunction. 5 tablet 11   vitamin E 180 MG (400 UNITS) capsule Take 400 Units by mouth daily.     zolpidem (AMBIEN) 10 MG tablet TAKE 1 TABLET(10 MG) BY MOUTH AT BEDTIME AS NEEDED FOR SLEEP 90 tablet 1   No facility-administered medications prior to visit.    No Known Allergies  ROS     Objective:    Physical Exam Constitutional:      General: He is not in acute distress.  Appearance: He is not ill-appearing.  HENT:     Mouth/Throat:     Mouth: Mucous membranes are moist.  Eyes:     Extraocular Movements: Extraocular movements intact.     Conjunctiva/sclera: Conjunctivae normal.  Cardiovascular:     Rate and Rhythm: Normal rate.  Pulmonary:     Effort: Pulmonary effort is normal.     Breath sounds: Normal breath sounds.  Abdominal:     General: Bowel sounds are normal. There is no distension.     Palpations: Abdomen is soft.     Tenderness: There is no abdominal tenderness. There is no guarding or rebound.       Comments: Bruising on left abdomen in various stages of healing, mostly yellowish. Thin, fibrous band of tissue palpated in subcutaneous tissue of left abdominal wall, non tender.   Musculoskeletal:     Cervical back: Normal range of motion and neck supple.  Skin:    General: Skin is warm and dry.  Neurological:     General: No focal deficit present.     Mental Status: He is alert and oriented to person, place, and time.  Psychiatric:        Mood and Affect: Mood normal.        Behavior: Behavior normal.        Thought Content: Thought content normal.     BP (!) 144/66 (BP Location: Left Arm, Patient Position: Sitting, Cuff Size: Large)   Pulse 80   Temp 97.6 F (36.4 C) (Temporal)   Ht 6' (1.829 m)   Wt 180 lb (81.6 kg)   SpO2 98%   BMI 24.41 kg/m  Wt Readings from Last 3 Encounters:  09/29/22 180 lb (81.6 kg)  08/30/22 180 lb 3.2 oz (81.7 kg)  07/25/22 180 lb (81.6 kg)       Assessment &  Plan:   Problem List Items Addressed This Visit   None Visit Diagnoses     Blunt trauma of abdominal wall, initial encounter    -  Primary   Relevant Orders   DG Abd Acute W/Chest (Completed)   Subcutaneous mass of abdominal wall       Relevant Orders   DG Abd Acute W/Chest (Completed)      No red flag symptoms. He is concerned about the palpated firm area in the middle of the bruise which appears to be scar tissue.  Abdominal X ray ordered. Follow up pending results.   I am having Dola Argyle. Volland "Ric" maintain his vitamin E, Multiple Vitamins-Minerals (HAIR/SKIN/NAILS/BIOTIN PO), cholecalciferol, fluticasone, PARoxetine, fenofibrate, sildenafil, atorvastatin, aspirin EC, hydrALAZINE, isosorbide mononitrate, nitroGLYCERIN, metoprolol tartrate, losartan, gabapentin, clopidogrel, metFORMIN, and zolpidem.  No orders of the defined types were placed in this encounter.

## 2022-10-05 ENCOUNTER — Ambulatory Visit: Payer: 59 | Attending: Cardiovascular Disease | Admitting: Pharmacist

## 2022-10-05 DIAGNOSIS — I700 Atherosclerosis of aorta: Secondary | ICD-10-CM | POA: Diagnosis not present

## 2022-10-05 DIAGNOSIS — Z8673 Personal history of transient ischemic attack (TIA), and cerebral infarction without residual deficits: Secondary | ICD-10-CM

## 2022-10-05 DIAGNOSIS — E785 Hyperlipidemia, unspecified: Secondary | ICD-10-CM

## 2022-10-05 DIAGNOSIS — E782 Mixed hyperlipidemia: Secondary | ICD-10-CM

## 2022-10-05 DIAGNOSIS — I350 Nonrheumatic aortic (valve) stenosis: Secondary | ICD-10-CM | POA: Diagnosis not present

## 2022-10-05 DIAGNOSIS — I10 Essential (primary) hypertension: Secondary | ICD-10-CM

## 2022-10-05 DIAGNOSIS — I25118 Atherosclerotic heart disease of native coronary artery with other forms of angina pectoris: Secondary | ICD-10-CM

## 2022-10-05 NOTE — Progress Notes (Signed)
Patient ID: Collin Bridges                 DOB: [birth year]                    MRN: [id number]     HPI: Collin Bridges is a 77 y.o. male patient referred to lipid clinic by Collin Bridges. Patient of Dr Flora Lipps. PMH is significant for multivessal CAD, NSTEMI, HLD, CVA, T2DM, CKD, and HTN.  Patient presents today with significant other Collin Bridges. Having to use nitro frequently, requests refill. Significant other answers most questions.  Continuing to recover from pneumonia, sepsis, CVA. Has cough with phlegm.   Currently on atorvastatin 80mg  nce daily and fenofibrate 160mg  daily. Reports compliance and no adverse effects.   Had surgical consult with Dr Leafy Ro on 07/25/22. Metoprolol started and medication management was recommended.  Patient not able to be physically active.  Current Medications:  Atorvastatin 80mg  daily Fenofibrate 160mg  daily  Intolerances: N/A  Risk Factors:  CAD Hx of NSTEMI CHF T2DM Hx of CVA  LDL goal: <55  Labs: TC 165, Trigs 159, HDL 48, LDL 89 (08/26/22 - atorvastatin 80 and fenofibrate 160)  Imaging: Coronary calcium score of 1963. This was 78 percentile for age and sex matched control.  Aortic valve calcium score 3505: Compatible with severe aortic stenosis. Echocardiogram 06/02/22 showed mild aortic stenosis  Severe proximal LAD, proximal large OM1, and proximal RCA disease (70-99% stenosis). FFR compatible with multivessel flow limiting disease.   Past Medical History:  Diagnosis Date   Allergy    seasonal   Anxiety    Depression    Headache    Hepatitis C    HLD (hyperlipidemia) 08/09/2017   Hypertension    Hypoglycemia    Stroke [facility])     Current Outpatient Medications on File Prior to Visit  Medication Sig Dispense Refill   aspirin EC 81 MG tablet Take 1 tablet (81 mg total) by mouth daily. Swallow whole. 30 tablet 2   atorvastatin (LIPITOR) 80 MG tablet Take 1 tablet (80 mg total) by mouth daily. 30 tablet 2    cholecalciferol (VITAMIN D3) 25 MCG (1000 UNIT) tablet Take 1 tablet (1,000 Units total) by mouth daily. Take 1,000 Units by mouth daily. 90 tablet 2   clopidogrel (PLAVIX) 75 MG tablet TAKE 1 TABLET BY MOUTH DAILY 60 tablet 5   fenofibrate 160 MG tablet Take 1 tablet (160 mg total) by mouth daily. 90 tablet 3   fluticasone (FLONASE) 50 MCG/ACT nasal spray USE 2 SPRAYS IN EACH NOSTRIL DAILY (Patient taking differently: Place 2 sprays into both nostrils daily. USE 2 SPRAYS IN EACH NOSTRIL DAILY) 48 g 2   gabapentin (NEURONTIN) 300 MG capsule Take 1 capsule (300 mg total) by mouth at bedtime. 90 capsule 3   hydrALAZINE (APRESOLINE) 25 MG tablet Take 1 tablet (25 mg total) by mouth every 8 (eight) hours. 90 tablet 2   isosorbide mononitrate (IMDUR) 30 MG 24 hr tablet Take 0.5 tablets (15 mg total) by mouth daily. 45 tablet 3   losartan (COZAAR) 100 MG tablet Take 1 tablet (100 mg total) by mouth daily. 90 tablet 3   metFORMIN (GLUCOPHAGE-XR) 500 MG 24 hr tablet TAKE 1 TABLET BY MOUTH ONCE  DAILY WITH BREAKFAST 60 tablet 5   metoprolol tartrate (LOPRESSOR) 25 MG tablet Take 0.5 tablets (12.5 mg total) by mouth 2 (two) times daily. 60 tablet 1   Multiple Vitamins-Minerals (HAIR/SKIN/NAILS/BIOTIN PO) Take 1 tablet by  mouth daily.     nitroGLYCERIN (NITROSTAT) 0.4 MG SL tablet Place 1 tablet (0.4 mg total) under the tongue every 5 (five) minutes as needed for chest pain. 25 tablet 3   PARoxetine (PAXIL) 40 MG tablet TAKE 1 TABLET(40 MG) BY MOUTH EVERY MORNING (Patient taking differently: Take 40 mg by mouth every morning.) 90 tablet 3   sildenafil (VIAGRA) 100 MG tablet Take 0.5-1 tablets (50-100 mg total) by mouth daily as needed for erectile dysfunction. 5 tablet 11   vitamin E 180 MG (400 UNITS) capsule Take 400 Units by mouth daily.     zolpidem (AMBIEN) 10 MG tablet TAKE 1 TABLET(10 MG) BY MOUTH AT BEDTIME AS NEEDED FOR SLEEP 90 tablet 1   No current facility-administered medications on file prior to  visit.    No Known Allergies  Assessment/Plan:  1. Hyperlipidemia - Patient's LDL 89 despite high intensity statin. Goal <55 due to history of multi vessel CAD, CVA, coronary calcium, and T2DM. Recommend addition of PCSK9i.   Using demo pen educated patient on mechanism of action, storage, site selection, administration, and possible adverse effects, Emphasized medication can cause "cold like" symptoms since he is still recovering from PNA. Will complete PA and contact patient when approved. Patient requests [facility] pharmacy delivery services.  Continue atorvastatin 80,mg daily Continue fenofibrate 160mg  daily Start Repatha 140mg  q 2 weeks Recheck lipid panel in 2-3 months  Collin Bridges, PharmD, BCACP, CDCES, CPP [address], [address] [place], [place], [address] Phone: [phone], Fax: [phone]

## 2022-10-05 NOTE — Patient Instructions (Addendum)
It was nice meeting you two today  We would like your LDL (bad cholesterol) to be less than 55  Please continue your fenofibrate 160mg  daily and your atorvastatin 80mg  daily  We would like to start a new medication called Repatha which you would inject once every 2 weeks  I will complete the prior authorization for you and contact you when it is approved  Once you start the medication we will recheck your cholesterol levels in 2-3 months  Please let me know if you have any questions  Laural Golden, PharmD, BCACP, CDCES, CPP [address], [address] [place], [place], [address] Phone: [phone], Fax: [phone]

## 2022-10-06 ENCOUNTER — Encounter: Payer: Self-pay | Admitting: Pharmacist

## 2022-10-06 ENCOUNTER — Other Ambulatory Visit (HOSPITAL_COMMUNITY): Payer: Self-pay

## 2022-10-06 ENCOUNTER — Other Ambulatory Visit: Payer: Self-pay

## 2022-10-06 ENCOUNTER — Telehealth: Payer: Self-pay | Admitting: Pharmacist

## 2022-10-06 DIAGNOSIS — I350 Nonrheumatic aortic (valve) stenosis: Secondary | ICD-10-CM

## 2022-10-06 DIAGNOSIS — E1165 Type 2 diabetes mellitus with hyperglycemia: Secondary | ICD-10-CM

## 2022-10-06 DIAGNOSIS — I25118 Atherosclerotic heart disease of native coronary artery with other forms of angina pectoris: Secondary | ICD-10-CM

## 2022-10-06 DIAGNOSIS — I214 Non-ST elevation (NSTEMI) myocardial infarction: Secondary | ICD-10-CM

## 2022-10-06 DIAGNOSIS — Z8673 Personal history of transient ischemic attack (TIA), and cerebral infarction without residual deficits: Secondary | ICD-10-CM

## 2022-10-06 DIAGNOSIS — J189 Pneumonia, unspecified organism: Secondary | ICD-10-CM

## 2022-10-06 DIAGNOSIS — I6300 Cerebral infarction due to thrombosis of unspecified precerebral artery: Secondary | ICD-10-CM

## 2022-10-06 DIAGNOSIS — N1831 Chronic kidney disease, stage 3a: Secondary | ICD-10-CM

## 2022-10-06 DIAGNOSIS — I3481 Nonrheumatic mitral (valve) annulus calcification: Secondary | ICD-10-CM

## 2022-10-06 DIAGNOSIS — E785 Hyperlipidemia, unspecified: Secondary | ICD-10-CM

## 2022-10-06 DIAGNOSIS — I10 Essential (primary) hypertension: Secondary | ICD-10-CM

## 2022-10-06 MED ORDER — NITROGLYCERIN 0.4 MG SL SUBL
0.4000 mg | SUBLINGUAL_TABLET | SUBLINGUAL | 3 refills | Status: DC | PRN
  Filled 2022-10-06 (×2): qty 25, 7d supply, fill #0
  Filled 2022-10-25 (×2): qty 25, 7d supply, fill #1
  Filled 2022-11-01: qty 25, 7d supply, fill #2

## 2022-10-06 MED ORDER — REPATHA SURECLICK 140 MG/ML ~~LOC~~ SOAJ
1.0000 mL | SUBCUTANEOUS | 3 refills | Status: DC
  Filled 2022-10-06 (×2): qty 6, 84d supply, fill #0
  Filled 2023-01-18: qty 6, 84d supply, fill #1
  Filled 2023-03-30: qty 6, 84d supply, fill #2
  Filled 2023-06-21: qty 6, 84d supply, fill #3

## 2022-10-06 NOTE — Telephone Encounter (Signed)
PA for Repatha submitted.  Ket: [id number]

## 2022-10-06 NOTE — Addendum Note (Signed)
Addended by: Cheree Ditto on: 10/06/2022 09:04 AM   Modules accepted: Orders

## 2022-10-06 NOTE — Telephone Encounter (Signed)
REPATHA SURE INJ 140MG /ML is approved through 04/08/2023. Your patient may now fill this prescription and it will be covered.. Authorization Expiration Date: April 08, 2023.

## 2022-10-08 ENCOUNTER — Other Ambulatory Visit (HOSPITAL_COMMUNITY): Payer: Self-pay

## 2022-10-09 ENCOUNTER — Other Ambulatory Visit: Payer: Self-pay | Admitting: Internal Medicine

## 2022-10-11 ENCOUNTER — Other Ambulatory Visit: Payer: Self-pay

## 2022-10-11 ENCOUNTER — Telehealth: Payer: Self-pay | Admitting: Internal Medicine

## 2022-10-11 ENCOUNTER — Other Ambulatory Visit (HOSPITAL_COMMUNITY): Payer: Self-pay

## 2022-10-11 MED ORDER — HYDRALAZINE HCL 25 MG PO TABS: 25.0000 mg | ORAL_TABLET | Freq: Three times a day (TID) | ORAL | 2 refills | Status: DC

## 2022-10-11 NOTE — Telephone Encounter (Signed)
Prescription Request  10/11/2022  LOV: 07/18/2022  What is the name of the medication or equipment?  hydrALAZINE (APRESOLINE) 25 MG tablet   Have you contacted your pharmacy to request a refill? No   Which pharmacy would you like this sent to?    [id number] [place], [place] - [address] AT [facility] OF [address] [place] [address] Phone: [phone] Fax: [phone]   Please advise at Mobile [phone] (mobile)

## 2022-10-11 NOTE — Telephone Encounter (Signed)
Ok [facility] to [facility] 

## 2022-10-11 NOTE — Telephone Encounter (Signed)
Done erx 

## 2022-10-12 ENCOUNTER — Other Ambulatory Visit (HOSPITAL_COMMUNITY): Payer: Self-pay

## 2022-10-25 ENCOUNTER — Other Ambulatory Visit (HOSPITAL_COMMUNITY): Payer: Self-pay

## 2022-10-27 ENCOUNTER — Encounter: Payer: 59 | Admitting: Gastroenterology

## 2022-10-28 ENCOUNTER — Other Ambulatory Visit (HOSPITAL_COMMUNITY): Payer: Self-pay

## 2022-10-28 ENCOUNTER — Other Ambulatory Visit: Payer: Self-pay

## 2022-10-28 ENCOUNTER — Encounter: Payer: Self-pay | Admitting: Nurse Practitioner

## 2022-10-28 ENCOUNTER — Ambulatory Visit: Payer: 59 | Attending: Nurse Practitioner | Admitting: Nurse Practitioner

## 2022-10-28 VITALS — BP 112/50 | HR 64 | Ht 72.0 in | Wt 178.0 lb

## 2022-10-28 DIAGNOSIS — I3481 Nonrheumatic mitral (valve) annulus calcification: Secondary | ICD-10-CM | POA: Diagnosis not present

## 2022-10-28 DIAGNOSIS — I350 Nonrheumatic aortic (valve) stenosis: Secondary | ICD-10-CM | POA: Diagnosis not present

## 2022-10-28 DIAGNOSIS — Z8673 Personal history of transient ischemic attack (TIA), and cerebral infarction without residual deficits: Secondary | ICD-10-CM

## 2022-10-28 DIAGNOSIS — E785 Hyperlipidemia, unspecified: Secondary | ICD-10-CM

## 2022-10-28 DIAGNOSIS — I10 Essential (primary) hypertension: Secondary | ICD-10-CM | POA: Diagnosis not present

## 2022-10-28 DIAGNOSIS — Z7984 Long term (current) use of oral hypoglycemic drugs: Secondary | ICD-10-CM

## 2022-10-28 DIAGNOSIS — I25118 Atherosclerotic heart disease of native coronary artery with other forms of angina pectoris: Secondary | ICD-10-CM | POA: Diagnosis not present

## 2022-10-28 DIAGNOSIS — N1831 Chronic kidney disease, stage 3a: Secondary | ICD-10-CM

## 2022-10-28 DIAGNOSIS — E1165 Type 2 diabetes mellitus with hyperglycemia: Secondary | ICD-10-CM

## 2022-10-28 MED ORDER — ISOSORBIDE MONONITRATE ER 30 MG PO TB24
30.0000 mg | ORAL_TABLET | Freq: Every day | ORAL | 3 refills | Status: DC
  Filled 2022-10-28 – 2022-11-01 (×2): qty 90, 90d supply, fill #0

## 2022-10-28 NOTE — Patient Instructions (Addendum)
Medication Instructions:   Increase IMDUR (Isosorbide Mononitrate) 30 mg daily   *If you need a refill on your cardiac medications before your next appointment, please call your pharmacy*   Lab Work:  Please have fasting lipid panel done in the next 1 to 2 weeks    If you have labs (blood work) drawn today and your tests are completely normal, you will receive your results only by: MyChart Message (if you have MyChart) OR A paper copy in the mail If you have any lab test that is abnormal or we need to change your treatment, we will call you to review the results.   Testing/Procedures:  Not needed  Follow-Up: At [facility], you and your health needs are our priority.  As part of our continuing mission to provide you with exceptional heart care, we have created designated Provider Care Teams.  These Care Teams include your primary Cardiologist (physician) and Advanced Practice Providers (APPs -  Physician Assistants and Nurse Practitioners) who all work together to provide you with the care you need, when you need it.     Your next appointment:   4 to 6 month(s)  The format for your next appointment:   In Person  Provider:   Reatha Harps, MD    Contact Dr Eugenio Hoes office to follow up on appointment --  [phone] Other Instructions

## 2022-10-28 NOTE — Progress Notes (Signed)
Office Visit    Patient Name: Collin Bridges Date of Encounter: 10/28/2022  Primary Care Provider:  Corwin Levins, MD Primary Cardiologist:  Reatha Harps, MD  Chief Complaint    77 year old male with a history of mutlivessel CAD s/p NSTEMI in 06/2022, calcified mitral valve tissue (TEE done to rule out MV mass), mild aortic stenosis, hypertension, hyperlipidemia, CVA, type 2 diabetes, CKD stage III, and community-acquired pneumonia who presents for follow-up related to CAD.   Past Medical History    Past Medical History:  Diagnosis Date   Allergy    seasonal   Anxiety    Depression    Headache    Hepatitis C    HLD (hyperlipidemia) 08/09/2017   Hypertension    Hypoglycemia    Stroke [facility])    Past Surgical History:  Procedure Laterality Date   APPENDECTOMY     BUBBLE STUDY  06/16/2021   Procedure: BUBBLE STUDY;  Surgeon: Sande Rives, MD;  Location: [facility] ENDOSCOPY;  Service: Cardiovascular;;   LEFT HEART CATH AND CORONARY ANGIOGRAPHY N/A 07/12/2022   Procedure: LEFT HEART CATH AND CORONARY ANGIOGRAPHY;  Surgeon: Marykay Lex, MD;  Location: [facility] INVASIVE CV LAB;  Service: Cardiovascular;  Laterality: N/A;   TEE WITHOUT CARDIOVERSION N/A 06/16/2021   Procedure: TRANSESOPHAGEAL ECHOCARDIOGRAM (TEE);  Surgeon: Sande Rives, MD;  Location: [facility] ENDOSCOPY;  Service: Cardiovascular;  Laterality: N/A;   TONSILECTOMY, ADENOIDECTOMY, BILATERAL MYRINGOTOMY AND TUBES      Allergies  No Known Allergies   Labs/Other Studies Reviewed    The following studies were reviewed today:  Cardiac Studies & Procedures   CARDIAC CATHETERIZATION  CARDIAC CATHETERIZATION 07/12/2022  Narrative   Ost LAD lesion is 70% stenosed.  1st Sept lesion is 70% stenosed.   Mid LAD lesion is 50% stenosed with 70% stenosed side branch in 1st Diag.   Mid Cx lesion is 95% stenosed.  1st Mrg lesion is 85% stenosed.   Dist Cx lesion is 85% stenosed.  LPAV lesion is 80% stenosed.    Prox RCA lesion is 95% stenosed.  Mid RCA-1 lesion is 80% stenosed. Mid RCA-2 lesion is 85% stenosed.   LV end diastolic pressure is mildly elevated.   There is no aortic valve stenosis.  POST-CATH SUMMARY Severe multivessel CAD involving LAD/D1, LCx, OM1, with extensive disease in small codominant RCA => not favorable from a percutaneous approach. There are distal targets in the LAD, D1, OM1, L PDA/PL 2, however the RCA is likely too small for bypassing. Mildly elevated LVEDP but relatively normal EF by 2D Echo   RECOMMENDATIONS. Return to nursing unit for ongoing care, will need to continue treatment for pneumonia.  From my preop standpoint, it may be best for him to be discharged home and heal from his ongoing illness prior to consideration of CVTS consultation for CABG.   Results discussed with Dr. Verdon Cummins, MD  Findings Coronary Findings Diagnostic  Dominance: Right  Left Main Vessel was injected. Vessel is large.  Left Anterior Descending Tapers distally is a very small caliber vessel to the apex There is mild diffuse disease throughout the vessel. Ost LAD lesion is 70% stenosed. Mid LAD lesion is 50% stenosed with 70% stenosed side branch in 1st Diag.  First Diagonal Branch Vessel is small in size.  First Septal Branch 1st Sept lesion is 70% stenosed.  Second Septal Branch Vessel is small in size. Essentially SP1 and SP2, is 1 bifurcating SP trunk  Third Diagonal Branch Vessel is small in size.  Left Anterior Descending Tapers distally is a very small caliber vessel to the apex There is mild diffuse disease throughout the vessel. Ost LAD lesion is 70% stenosed. Mid LAD lesion is 50% stenosed with 70% stenosed side branch in 1st Diag.  First Diagonal Branch Vessel is small in size.  First Septal Branch 1st Sept lesion is 70% stenosed.  Second Septal Branch Vessel is small in size. Essentially SP1 and SP2, is 1 bifurcating SP  trunk  Third Diagonal Branch Vessel is small in size.  Left Circumflex Vessel is large. There is severe focal disease in the vessel. Mid Cx lesion is 95% stenosed. Dist Cx lesion is 85% stenosed.  First Obtuse Marginal Branch Vessel is large in size. There is mild disease in the vessel. 1st Mrg lesion is 85% stenosed.  Second Obtuse Marginal Branch Vessel is small in size.  Left Posterior Atrioventricular Artery Vessel is large in size. LPAV lesion is 80% stenosed.  Right Coronary Artery Vessel is small. There is moderate diffuse disease throughout the vessel. Prox RCA lesion is 95% stenosed. Mid RCA-1 lesion is 80% stenosed. Mid RCA-2 lesion is 85% stenosed.  Acute Marginal Branch Vessel is small in size.  Right Ventricular Branch Vessel is small in size.  Right Posterior Descending Artery Vessel is small in size.  First Right Posterolateral Branch Vessel is small in size.  Intervention  No interventions have been documented.     ECHOCARDIOGRAM  ECHOCARDIOGRAM COMPLETE 07/09/2022  Narrative ECHOCARDIOGRAM REPORT    Patient Name:   Collin Bridges Date of Exam: 07/09/2022 Medical Rec #:  [id number]             Height:       72.0 in Accession #:    [id number]            Weight:       180.0 lb Date of Birth:  1945/05/30            BSA:          2.037 m Patient Age:    76 years              BP:           161/85 mmHg Patient Gender: M                     HR:           89 bpm. Exam Location:  Inpatient  Procedure: 2D Echo  Indications:    elevated troponin  History:        Patient has prior history of Echocardiogram examinations, most recent 06/16/2021. CHF, chronic kidney disease. Sepsis.; Risk Factors:Diabetes, Hypertension and Dyslipidemia.  Sonographer:    Delcie Roch RDCS Referring Phys: [id number] Angie Fava   Sonographer Comments: Image acquisition challenging due to respiratory motion. IMPRESSIONS   1. Left ventricular  ejection fraction, by estimation, is 60 to 65%. The left ventricle has normal function. The left ventricle demonstrates regional wall motion abnormalities (see scoring diagram/findings for description). Left ventricular diastolic parameters are consistent with Grade I diastolic dysfunction (impaired relaxation). 2. Right ventricular systolic function is hyperdynamic. The right ventricular size is mildly enlarged. 3. Calcified subchordal appatatus and previously describev. The mitral valve is abnormal. Trivial mitral valve regurgitation. No evidence of mitral stenosis. Moderate mitral annular calcification. 4. The aortic valve is abnormal. Aortic valve regurgitation is not visualized. Mild aortic valve stenosis. 5. The  pulmonary veins are not sufficiently imaged to evalution for pulmonary vein thrombus based off of this study.  FINDINGS Left Ventricle: Left ventricular ejection fraction, by estimation, is 60 to 65%. The left ventricle has normal function. The left ventricle demonstrates regional wall motion abnormalities. The left ventricular internal cavity size was normal in size. There is no left ventricular hypertrophy. Left ventricular diastolic parameters are consistent with Grade I diastolic dysfunction (impaired relaxation).   LV Wall Scoring: The basal inferior segment is hypokinetic.  Right Ventricle: The right ventricular size is mildly enlarged. No increase in right ventricular wall thickness. Right ventricular systolic function is hyperdynamic.  Left Atrium: Left atrial size was normal in size.  Right Atrium: Right atrial size was normal in size.  Pericardium: There is no evidence of pericardial effusion. Presence of epicardial fat layer.  Mitral Valve: Calcified subchordal appatatus and previously describev. The mitral valve is abnormal. Moderate mitral annular calcification. Trivial mitral valve regurgitation. No evidence of mitral valve stenosis.  Tricuspid Valve: The  tricuspid valve is normal in structure. Tricuspid valve regurgitation is not demonstrated. No evidence of tricuspid stenosis.  Aortic Valve: The aortic valve is abnormal. Aortic valve regurgitation is not visualized. Mild aortic stenosis is present. Aortic valve mean gradient measures 10.0 mmHg. Aortic valve peak gradient measures 21.5 mmHg. Aortic valve area, by VTI measures 1.37 cm.  Pulmonic Valve: The pulmonary veins are not sufficiently imaged to evalution for pulmonary vein thrombus based off of this study. The pulmonic valve was not well visualized. Pulmonic valve regurgitation is not visualized.  Aorta: The aortic root and ascending aorta are structurally normal, with no evidence of dilitation.  Venous: The pulmonary veins are not sufficiently imaged to evalution for pulmonary vein thrombus based off of this study.  IAS/Shunts: No atrial level shunt detected by color flow Doppler.   LEFT VENTRICLE PLAX 2D LVIDd:         4.60 cm   Diastology LVIDs:         3.60 cm   LV e' medial:    8.27 cm/s LV PW:         0.90 cm   LV E/e' medial:  11.0 LV IVS:        1.00 cm   LV e' lateral:   8.81 cm/s LVOT diam:     1.90 cm   LV E/e' lateral: 10.3 LV SV:         61 LV SV Index:   30 LVOT Area:     2.84 cm   RIGHT VENTRICLE RV Basal diam:  2.70 cm RV S prime:     13.80 cm/s TAPSE (M-mode): 3.0 cm  LEFT ATRIUM             Index        RIGHT ATRIUM           Index LA diam:        3.70 cm 1.82 cm/m   RA Area:     15.30 cm LA Vol (A2C):   43.1 ml 21.16 ml/m  RA Volume:   38.30 ml  18.80 ml/m LA Vol (A4C):   51.2 ml 25.13 ml/m LA Biplane Vol: 49.5 ml 24.30 ml/m AORTIC VALVE AV Area (Vmax):    1.16 cm AV Area (Vmean):   1.23 cm AV Area (VTI):     1.37 cm AV Vmax:           232.00 cm/s AV Vmean:  152.000 cm/s AV VTI:            0.448 m AV Peak Grad:      21.5 mmHg AV Mean Grad:      10.0 mmHg LVOT Vmax:         94.85 cm/s LVOT Vmean:        65.750 cm/s LVOT VTI:           0.216 m LVOT/AV VTI ratio: 0.48  AORTA Ao Root diam: 3.50 cm Ao Asc diam:  3.70 cm  MITRAL VALVE MV Area (PHT): 4.31 cm     SHUNTS MV Decel Time: 176 msec     Systemic VTI:  0.22 m MV E velocity: 90.70 cm/s   Systemic Diam: 1.90 cm MV A velocity: 116.00 cm/s MV E/A ratio:  0.78  Riley Lam MD Electronically signed by Riley Lam MD Signature Date/Time: 07/09/2022/1:08:59 PM    Final   TEE  ECHO TEE 06/16/2021  Narrative TRANSESOPHOGEAL ECHO REPORT    Patient Name:   Collin Bridges Date of Exam: 06/16/2021 Medical Rec #:  [id number]             Height:       72.0 in Accession #:    [id number]            Weight:       196.0 lb Date of Birth:  [birth year]            BSA:          2.112 m Patient Age:    75 years              BP:           151/82 mmHg Patient Gender: M                     HR:           86 bpm. Exam Location:  Inpatient  Procedure: Transesophageal Echo, 3D Echo, Cardiac Doppler, Color Doppler and Saline Contrast Bubble Study  Indications:     Mitral Valve Mass  History:         Patient has prior history of Echocardiogram examinations, most recent 04/08/2021. Stroke; Risk Factors:Dyslipidemia and Hypertension.  Sonographer:     Eulah Pont RDCS Referring Phys:  [id number] Ronnald Ramp O'NEAL Diagnosing Phys: Lennie Odor MD  PROCEDURE: After discussion of the risks and benefits of a TEE, an informed consent was obtained from the patient. TEE procedure time was 16 minutes. The transesophogeal probe was passed without difficulty through the esophogus of the patient. Imaged were obtained with the patient in a left lateral decubitus position. Sedation performed by different physician. The patient was monitored while under deep sedation. Anesthestetic sedation was provided intravenously by Anesthesiology: 200mg  of Propofol. Image quality was excellent. The patient's vital signs; including heart rate, blood pressure, and oxygen  saturation; remained stable throughout the procedure. The patient developed no complications during the procedure.  IMPRESSIONS   1. Calcified chordal tissue noted under the mitral valve apparatus. Benign finding and likely not the etiology of stroke. The mitral valve is degenerative. Mild mitral valve regurgitation. No evidence of mitral stenosis. 2. No left atrial/left atrial appendage thrombus was detected. The LAA emptying velocity was 71 cm/s. 3. The aortic valve is tricuspid. There is mild calcification of the aortic valve. There is mild thickening of the aortic valve. Aortic valve regurgitation is not visualized. Mild aortic valve stenosis. Aortic valve area, by VTI  measures 1.87 cm. Aortic valve mean gradient measures 14.0 mmHg. Aortic valve Vmax measures 2.35 m/s. 4. There is Moderate (Grade III) layered plaque involving the descending aorta. 5. Left ventricular ejection fraction, by estimation, is 60 to 65%. The left ventricle has normal function. 6. Right ventricular systolic function is normal. The right ventricular size is normal. 7. Agitated saline contrast bubble study was negative, with no evidence of any interatrial shunt.  Conclusion(s)/Recommendation(s): No LA/LAA thrombus identified. Negative bubble study for interatrial shunt. No intracardiac source of embolism detected on this on this transesophageal echocardiogram.  FINDINGS Left Ventricle: Left ventricular ejection fraction, by estimation, is 60 to 65%. The left ventricle has normal function. The left ventricular internal cavity size was normal in size.  Right Ventricle: The right ventricular size is normal. No increase in right ventricular wall thickness. Right ventricular systolic function is normal.  Left Atrium: Left atrial size was normal in size. No left atrial/left atrial appendage thrombus was detected. The LAA emptying velocity was 71 cm/s.  Right Atrium: Right atrial size was normal in size.  Pericardium:  Trivial pericardial effusion is present.  Mitral Valve: Calcified chordal tissue noted under the mitral valve apparatus. Benign finding and likely not the etiology of stroke. The mitral valve is degenerative in appearance. Mild to moderate mitral annular calcification. Mild mitral valve regurgitation. No evidence of mitral valve stenosis.  Tricuspid Valve: The tricuspid valve is grossly normal. Tricuspid valve regurgitation is trivial. No evidence of tricuspid stenosis.  Aortic Valve: The aortic valve is tricuspid. There is mild calcification of the aortic valve. There is mild thickening of the aortic valve. Aortic valve regurgitation is not visualized. Mild aortic stenosis is present. Aortic valve mean gradient measures 14.0 mmHg. Aortic valve peak gradient measures 22.1 mmHg. Aortic valve area, by VTI measures 1.87 cm.  Pulmonic Valve: The pulmonic valve was grossly normal. Pulmonic valve regurgitation is trivial. No evidence of pulmonic stenosis.  Aorta: The aortic root and ascending aorta are structurally normal, with no evidence of dilitation. There is moderate (Grade III) layered plaque involving the descending aorta.  Venous: The left upper pulmonary vein, left lower pulmonary vein, right lower pulmonary vein and right upper pulmonary vein are normal.  IAS/Shunts: There is redundancy of the interatrial septum. No atrial level shunt detected by color flow Doppler. Agitated saline contrast was given intravenously to evaluate for intracardiac shunting. Agitated saline contrast bubble study was negative, with no evidence of any interatrial shunt.   LEFT VENTRICLE PLAX 2D LVOT diam:     2.40 cm LV SV:         91 LV SV Index:   43 LVOT Area:     4.52 cm   AORTIC VALVE AV Area (Vmax):    1.87 cm AV Area (Vmean):   1.65 cm AV Area (VTI):     1.87 cm AV Vmax:           235.00 cm/s AV Vmean:          179.000 cm/s AV VTI:            0.490 m AV Peak Grad:      22.1 mmHg AV Mean  Grad:      14.0 mmHg LVOT Vmax:         97.20 cm/s LVOT Vmean:        65.400 cm/s LVOT VTI:          0.202 m LVOT/AV VTI ratio: 0.41  AORTA Ao Root diam: 3.48 cm  Ao Asc diam:  3.70 cm   SHUNTS Systemic VTI:  0.20 m Systemic Diam: 2.40 cm  Lennie Odor MD Electronically signed by Lennie Odor MD Signature Date/Time: 06/16/2021/11:46:00 AM    Final    CT SCANS  CT CORONARY MORPH W/CTA COR W/SCORE 07/11/2022  Addendum 07/11/2022  4:26 PM ADDENDUM REPORT: 07/11/2022 16:23  EXAM: OVER-READ INTERPRETATION  CT CHEST  The following report is an over-read performed by radiologist Dr. Alver Fisher [facility] on 07/11/2022. This over-read does not include interpretation of cardiac or coronary anatomy or pathology. The interpretation by the cardiologist is attached.  COMPARISON:  CT chest 07/11/2022 and 07/09/2022.  FINDINGS: Scout view is unremarkable. Scattered patchy peribronchovascular ground-glass. Mild dependent atelectasis bilaterally. Pulmonary nodules measure up to 5 mm in the left lower lobe (12/32). Liver margin is irregular.  IMPRESSION: 1. Peribronchovascular ground-glass, likely due to a mild bronchopneumonia. 2. 5 mm left lower lobe nodule. Please refer to diagnostic CT chest performed the same day for further details and recommendations. 3. Cirrhosis.   Electronically Signed By: Leanna Battles M.D. On: 07/11/2022 16:23  Narrative CLINICAL DATA:  77 year old with CVA and elevated troponin  EXAM: Cardiac/Coronary  CTA  TECHNIQUE: The patient was scanned on a [employer].  FINDINGS: A 120 kV prospective scan was triggered in the descending thoracic aorta at 111 HU's. Axial non-contrast 3 mm slices were carried out through the heart. The data set was analyzed on a dedicated work station and scored using the Agatson method. Gantry rotation speed was 250 msecs and collimation was .6 mm. 0.8 mg of sl NTG was given. The  3D data set was reconstructed in 5% intervals of the 67-82 % of the R-R cycle. Diastolic phases were analyzed on a dedicated work station using MPR, MIP and VRT modes. The patient received 80 cc of contrast.  Image quality: good  Aorta:  Normal size.  Aortic atherosclerosis.  No dissection.  Aortic Valve: Dense  calcifications.  Coronary Arteries:  Normal coronary origin.  Right dominance.  RCA is a large dominant artery that gives rise to PDA and PLA. Proximal vessel is moderately stenosed 50-69% prior to dense calcified plaque that contains severe stenosis 70-99%. Mid to distal vessel is moderately diffusely diseased. PDA is small and poorly filled.  Left main is a large artery that gives rise to LAD and LCX arteries. There is mixed calcified and non calcified distal LM stenosis 0-24%  LAD is a large vessel. There is ostial moderate stenosis 50-69%, proximal calcified severe stenosis 70-99% prior to large D1. Mid vessel with high risk low attenuation plaque diffuse. Remainder of LAD wraps around apex.  -D1 - large vessel, proximal calcified stenosis moderate to severe.  -D2 - small caliber, not well filled.  LCX is a non-dominant artery that gives rise to one large OM1 branch. Proximal mixed plaque 30-49% mild. Mid AV groove 99% stenosis.  -OM1 - large branch with severe proximal vessel mixed plaque, 70-99%  Other findings:  Normal pulmonary vein drainage into the left atrium. No evidence of PV thrombosis.  Normal left atrial appendage without a thrombus.  Normal size of the pulmonary artery.  Please see radiology report for non cardiac findings.  IMPRESSION: 1. Coronary calcium score of 1963. This was 64 percentile for age and sex matched control.  2.  Normal coronary origin with right dominance.  3. Severe proximal LAD, proximal large OM1, and proximal RCA disease (70-99% stenosis). FFR compatible with multivessel flow  limiting disease.  4. Aortic valve  calcium score 3505: Compatible with severe aortic stenosis. Echocardiogram 06/02/22 showed mild aortic stenosis.  5.  Aortic atherosclerosis.  6.  No evidence of pulmonary vein thrombosis.  Electronically Signed: By: Donato Schultz M.D. On: 07/11/2022 15:54         Recent Labs: 06/03/2022: TSH 0.98 07/09/2022: Magnesium 2.1 07/13/2022: BUN 17; Creatinine, Ser 1.36; Hemoglobin 11.9; Platelets 96; Potassium 3.7; Sodium 137 08/26/2022: ALT 25  Recent Lipid Panel    Component Value Date/Time   CHOL 165 08/26/2022 1108   TRIG 159 (H) 08/26/2022 1108   HDL 48 08/26/2022 1108   CHOLHDL 3.4 08/26/2022 1108   CHOLHDL 4.5 07/10/2022 0742   VLDL 26 07/10/2022 0742   LDLCALC 89 08/26/2022 1108   LDLDIRECT 146.0 06/03/2022 0849    History of Present Illness    77 year old male with the above past medical history including mutlivessel CAD s/p NSTEMI in 06/2022,  calcified mitral valve tissue (TEE done to rule out MV mass), mild aortic stenosis, hypertension, hyperlipidemia, CVA, type 2 diabetes, CKD stage III, and community-acquired pneumonia.   He was initially evaluated by cardiology in 05/2021 for follow-up related to echocardiogram that showed possible mitral valve mass.  Follow-up TEE revealed calcified mitral valve tissue, no further evaluation was recommended.  He was hospitalized in February 2024 with severe sepsis secondary to community-acquired pneumonia, NSTEMI. Echocardiogram showed EF 60 to 65%, hypokinesis of the basal inferior segment, G1 DD, moderate MAC, mild aortic valve stenosis.  Coronary CTA with multivessel CAD with FFR positive mid LAD, mid to distal left circumflex, proximal RCA.  He underwent cardiac catheterization which revealed multivessel CAD involving the LAD/D1, LCx, OM1, with extensive disease in small codominant RCA => not favorable from a percutaneous approach.  CT surgery consultation was deferred to the outpatient setting.  MRI/A of the head during admission showed  scattered bilateral subcortical strokes thought to be in the setting of small vessel disease, stable 1 x 2 mm anterior communicating artery aneurysm (he has a history of prior CVA, follows with neurology).  He was last seen in the office on 07/25/2022 and was stable overall from a cardiac standpoint.  He did note some intermittent chest pain, dyspnea on exertion.  He was started on Imdur.  He saw CT surgery who felt that optimizing medical therapy for CAD was the best course given need for ongoing recovery following hospitalization for severe sepsis.  It was noted that should he fail medical management, CABG could then be reconsidered.  He was started on Repatha.   He presents today for follow-up accompanied by his girlfriend. Since his last visit he has been stable overall from a cardiac standpoint though he does note increased frequency of intermittent chest chest pain, dyspnea on exertion.  He has been taking nitroglycerin regularly despite treatment with Imdur.  He is eager to follow-up with CT surgery to discuss the possibility of surgical intervention for management of his CAD.  Home Medications    Current Outpatient Medications  Medication Sig Dispense Refill   cholecalciferol (VITAMIN D3) 25 MCG (1000 UNIT) tablet Take 1 tablet (1,000 Units total) by mouth daily. Take 1,000 Units by mouth daily. 90 tablet 2   clopidogrel (PLAVIX) 75 MG tablet TAKE 1 TABLET BY MOUTH DAILY 60 tablet 5   Evolocumab (REPATHA SURECLICK) 140 MG/ML SOAJ Inject 140 mg into the skin every 14 days. 6 mL 3   fenofibrate 160 MG tablet Take 1 tablet (160 mg total)  by mouth daily. 90 tablet 3   fluticasone (FLONASE) 50 MCG/ACT nasal spray USE 2 SPRAYS IN EACH NOSTRIL DAILY (Patient taking differently: Place 2 sprays into both nostrils daily. USE 2 SPRAYS IN EACH NOSTRIL DAILY) 48 g 2   gabapentin (NEURONTIN) 300 MG capsule Take 1 capsule (300 mg total) by mouth at bedtime. 90 capsule 3   hydrALAZINE (APRESOLINE) 25 MG tablet  Take 1 tablet (25 mg total) by mouth every 8 (eight) hours. 90 tablet 2   isosorbide mononitrate (IMDUR) 30 MG 24 hr tablet Take 1 tablet (30 mg total) by mouth daily. 90 tablet 3   losartan (COZAAR) 100 MG tablet TAKE 1 TABLET BY MOUTH DAILY 60 tablet 5   metFORMIN (GLUCOPHAGE-XR) 500 MG 24 hr tablet TAKE 1 TABLET BY MOUTH ONCE  DAILY WITH BREAKFAST 60 tablet 5   metoprolol tartrate (LOPRESSOR) 25 MG tablet Take 0.5 tablets (12.5 mg total) by mouth 2 (two) times daily. 60 tablet 1   Multiple Vitamins-Minerals (HAIR/SKIN/NAILS/BIOTIN PO) Take 1 tablet by mouth daily.     nitroGLYCERIN (NITROSTAT) 0.4 MG SL tablet Place 1 tablet under the tongue every 5 minutes as needed for chest pain. 25 tablet 3   PARoxetine (PAXIL) 40 MG tablet TAKE 1 TABLET(40 MG) BY MOUTH EVERY MORNING (Patient taking differently: Take 40 mg by mouth every morning.) 90 tablet 3   vitamin E 180 MG (400 UNITS) capsule Take 400 Units by mouth daily.     zolpidem (AMBIEN) 10 MG tablet TAKE 1 TABLET(10 MG) BY MOUTH AT BEDTIME AS NEEDED FOR SLEEP 90 tablet 1   atorvastatin (LIPITOR) 80 MG tablet Take 1 tablet (80 mg total) by mouth daily. 30 tablet 2   No current facility-administered medications for this visit.     Review of Systems    He denies palpitations, pnd, orthopnea, n, v, dizziness, syncope, edema, weight gain, or early satiety. All other systems reviewed and are otherwise negative except as noted above.   Physical Exam    VS:  BP (!) 112/50 (BP Location: Left Arm, Patient Position: Sitting, Cuff Size: Normal)   Pulse 64   Ht 6' (1.829 m)   Wt 178 lb (80.7 kg)   BMI 24.14 kg/m   GEN: Well nourished, well developed, in no acute distress. HEENT: normal. Neck: Supple, no JVD, carotid bruits, or masses. Cardiac: RRR, 2/6 murmur, no rubs, or gallops. No clubbing, cyanosis, edema.  Radials/DP/PT 2+ and equal bilaterally.  Respiratory:  Respirations regular and unlabored, clear to auscultation bilaterally. GI: Soft,  nontender, nondistended, BS + x 4. MS: no deformity or atrophy. Skin: warm and dry, no rash. Neuro:  Strength and sensation are intact. Psych: Normal affect.  Accessory Clinical Findings    ECG personally reviewed by me today -NSR, 64 bpm- no acute changes.   Lab Results  Component Value Date   WBC 4.1 07/13/2022   HGB 11.9 (L) 07/13/2022   HCT 34.6 (L) 07/13/2022   MCV 90.8 07/13/2022   PLT 96 (L) 07/13/2022   Lab Results  Component Value Date   CREATININE 1.36 (H) 07/13/2022   BUN 17 07/13/2022   NA 137 07/13/2022   K 3.7 07/13/2022   CL 107 07/13/2022   CO2 23 07/13/2022   Lab Results  Component Value Date   ALT 25 08/26/2022   AST 29 08/26/2022   ALKPHOS 95 08/26/2022   BILITOT 0.7 08/26/2022   Lab Results  Component Value Date   CHOL 165 08/26/2022   HDL  48 08/26/2022   LDLCALC 89 08/26/2022   LDLDIRECT 146.0 06/03/2022   TRIG 159 (H) 08/26/2022   CHOLHDL 3.4 08/26/2022    Lab Results  Component Value Date   HGBA1C 6.3 (H) 07/09/2022    Assessment & Plan    1. CAD: Cath on 07/12/2022 revealed multivessel CAD involving the LAD/D1, LCx, OM1, with extensive disease in small codominant RCA => not favorable from a percutaneous approach.  CT surgery consult for consideration of CABG was recommended, however, was felt that patient would benefit from recovery from his acute infection prior to surgery. Pneumonia has resolved.  He notes increased dyspnea on exertion, increased frequency of chest pain.  He has been using nitroglycerin regularly.  Will increase Imdur to 30 mg daily.  Recommend follow-up with CT surgery.  He will contact Dr. Alfonso Ellis office to schedule follow-up appointment.  Discussed ED precautions. Continue Plavix, hydralazine, losartan, Lipitor, fenofibrate, and Repatha.   2. Valvular heart disease: H/o of possible mitral valve mass, follow-up TEE revealed calcified tissue, no further evaluation was recommended.  Most recent echo in 06/2022 showed EF 60  to 65%, hypokinesis of the basal inferior segment, G1 DD, moderate MAC, mild aortic valve stenosis, stable. Euvolemic and well compensated on exam.    3. Hypertension: BP well controlled. Continue current antihypertensive regimen.    4. Hyperlipidemia: LDL was 140 in 06/2022.  Will repeat fasting lipid panel.  Continue Lipitor, fenofibrate, Repatha.    5. 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  Following with neurology.  Continue Plavix, Lipitor, Repatha.  6. Type 2 diabetes: A1c was 6.3 in 06/2022. Monitored and managed per PCP.    7. CKD stage III: Creatinine was stable at 1.36 on 06/2022.   8. Disposition: Follow-up with CT surgery, follow-up in 4 to 6 months with Dr. Flora Lipps.       Joylene Grapes, NP 10/28/2022, 11:09 AM

## 2022-10-30 NOTE — Progress Notes (Unsigned)
[address]       [place] [id number]             [phone]           [name] Nezperce Medical Record #[id number] Date of Birth: 1945-09-17  Corwin Levins, MD Corwin Levins, MD  Chief Complaint:  Angina  History of Present Illness:     Pt known to me from consult earlier this year following work up in hospital following sepsis/pnuemonia with NSTEMI and cath with severe CAD. Pt with normal LV function at that time. Pt required extensive rehab after deconditioning. Pt now has low level exertional angina to where he is taking NTG and is unable to do much walking secondary to fatigue also. He no longer is coughing up phlegm. He is eager to proceed with CABG      Past Medical History:  Diagnosis Date   Allergy    seasonal   Anxiety    Depression    Headache    Hepatitis C    HLD (hyperlipidemia) 08/09/2017   Hypertension    Hypoglycemia    Stroke [facility])     Past Surgical History:  Procedure Laterality Date   APPENDECTOMY     BUBBLE STUDY  06/16/2021   Procedure: BUBBLE STUDY;  Surgeon: Sande Rives, MD;  Location: [facility] [place] ENDOSCOPY;  Service: Cardiovascular;;   LEFT HEART CATH AND CORONARY ANGIOGRAPHY N/A 07/12/2022   Procedure: LEFT HEART CATH AND CORONARY ANGIOGRAPHY;  Surgeon: Marykay Lex, MD;  Location: [facility] INVASIVE CV LAB;  Service: Cardiovascular;  Laterality: N/A;   TEE WITHOUT CARDIOVERSION N/A 06/16/2021   Procedure: TRANSESOPHAGEAL ECHOCARDIOGRAM (TEE);  Surgeon: Sande Rives, MD;  Location[facility] ENDOSCOPY;  Service: Cardiovascular;  Laterality: N/A;   TONSILECTOMY, ADENOIDECTOMY, BILATERAL MYRINGOTOMY AND TUBES      Social History   Tobacco Use  Smoking Status Former  Smokeless Tobacco Never  Tobacco Comments   quit 12yrs ago, smoke a cigar occassionally.    Social History   Substance and Sexual Activity  Alcohol Use No    Social History   Socioeconomic History   Marital status: Single    Spouse  name: Not on file   Number of children: 0   Years of education: some college   Highest education level: Associate degree: occupational, Scientist, product/process development, or vocational program  Occupational History   Occupation: retired  Tobacco Use   Smoking status: Former   Smokeless tobacco: Never   Tobacco comments:    quit 53yrs ago, smoke a cigar occassionally.  Substance and Sexual Activity   Alcohol use: No   Drug use: No   Sexual activity: Yes  Other Topics Concern   Not on file  Social History Narrative   Lives alone.   Right-handed.   Two cups caffeine per day.   Social Determinants of Health   Financial Resource Strain: Low Risk  (09/29/2022)   Overall Financial Resource Strain (CARDIA)    Difficulty of Paying Living Expenses: Not hard at all  Food Insecurity: No Food Insecurity (09/29/2022)   Hunger Vital Sign    Worried About Running Out of Food in the Last Year: Never true    Ran Out of Food in the Last Year: Never true  Transportation Needs: No Transportation Needs (09/29/2022)   PRAPARE - Administrator, Civil Service (Medical): No    Lack of Transportation (Non-Medical): No  Physical Activity: Insufficiently Active (09/29/2022)  Exercise Vital Sign    Days of Exercise per Week: 1 day    Minutes of Exercise per Session: 10 min  Stress: No Stress Concern Present (09/29/2022)   [employer] of Occupational Health - Occupational Stress Questionnaire    Feeling of Stress : Not at all  Social Connections: Socially Isolated (09/29/2022)   Social Connection and Isolation Panel [NHANES]    Frequency of Communication with Friends and Family: More than three times a week    Frequency of Social Gatherings with Friends and Family: Once a week    Attends Religious Services: Never    Database administrator or Organizations: No    Attends Engineer, structural: Not on file    Marital Status: Divorced  Intimate Partner Violence: Not At Risk (07/09/2022)   Humiliation,  Afraid, Rape, and Kick questionnaire    Fear of Current or Ex-Partner: No    Emotionally Abused: No    Physically Abused: No    Sexually Abused: No    No Known Allergies  Current Outpatient Medications  Medication Sig Dispense Refill   atorvastatin (LIPITOR) 80 MG tablet Take 1 tablet (80 mg total) by mouth daily. 30 tablet 2   cholecalciferol (VITAMIN D3) 25 MCG (1000 UNIT) tablet Take 1 tablet (1,000 Units total) by mouth daily. Take 1,000 Units by mouth daily. 90 tablet 2   clopidogrel (PLAVIX) 75 MG tablet TAKE 1 TABLET BY MOUTH DAILY 60 tablet 5   Evolocumab (REPATHA SURECLICK) 140 MG/ML SOAJ Inject 140 mg into the skin every 14 days. 6 mL 3   fenofibrate 160 MG tablet Take 1 tablet (160 mg total) by mouth daily. 90 tablet 3   fluticasone (FLONASE) 50 MCG/ACT nasal spray USE 2 SPRAYS IN EACH NOSTRIL DAILY (Patient taking differently: Place 2 sprays into both nostrils daily. USE 2 SPRAYS IN EACH NOSTRIL DAILY) 48 g 2   gabapentin (NEURONTIN) 300 MG capsule Take 1 capsule (300 mg total) by mouth at bedtime. 90 capsule 3   hydrALAZINE (APRESOLINE) 25 MG tablet Take 1 tablet (25 mg total) by mouth every 8 (eight) hours. 90 tablet 2   isosorbide mononitrate (IMDUR) 30 MG 24 hr tablet Take 1 tablet (30 mg total) by mouth daily. 90 tablet 3   losartan (COZAAR) 100 MG tablet TAKE 1 TABLET BY MOUTH DAILY 60 tablet 5   metFORMIN (GLUCOPHAGE-XR) 500 MG 24 hr tablet TAKE 1 TABLET BY MOUTH ONCE  DAILY WITH BREAKFAST 60 tablet 5   metoprolol tartrate (LOPRESSOR) 25 MG tablet Take 0.5 tablets (12.5 mg total) by mouth 2 (two) times daily. 60 tablet 1   Multiple Vitamins-Minerals (HAIR/SKIN/NAILS/BIOTIN PO) Take 1 tablet by mouth daily.     nitroGLYCERIN (NITROSTAT) 0.4 MG SL tablet Place 1 tablet under the tongue every 5 minutes as needed for chest pain. 25 tablet 3   PARoxetine (PAXIL) 40 MG tablet TAKE 1 TABLET(40 MG) BY MOUTH EVERY MORNING (Patient taking differently: Take 40 mg by mouth every  morning.) 90 tablet 3   vitamin E 180 MG (400 UNITS) capsule Take 400 Units by mouth daily.     zolpidem (AMBIEN) 10 MG tablet TAKE 1 TABLET(10 MG) BY MOUTH AT BEDTIME AS NEEDED FOR SLEEP 90 tablet 1   No current facility-administered medications for this visit.     Family History  Problem Relation Age of Onset   Lung disease Mother    Hyperlipidemia Father    Hypertension Father        Physical  Exam: Lungs: clear Card: rr Ext: no edema Neuro: alert and without focal issues     Diagnostic Studies & Laboratory data: I have personally reviewed the following studies and agree with the findings     Recent Radiology Findings:       Recent Lab Findings: Lab Results  Component Value Date   WBC 4.1 07/13/2022   HGB 11.9 (L) 07/13/2022   HCT 34.6 (L) 07/13/2022   PLT 96 (L) 07/13/2022   GLUCOSE 140 (H) 07/13/2022   CHOL 165 08/26/2022   TRIG 159 (H) 08/26/2022   HDL 48 08/26/2022   LDLDIRECT 146.0 06/03/2022   LDLCALC 89 08/26/2022   ALT 25 08/26/2022   AST 29 08/26/2022   NA 137 07/13/2022   K 3.7 07/13/2022   CL 107 07/13/2022   CREATININE 1.36 (H) 07/13/2022   BUN 17 07/13/2022   CO2 23 07/13/2022   TSH 0.98 06/03/2022   INR 1.0 04/07/2021   HGBA1C 6.3 (H) 07/09/2022      Assessment / Plan:     77 yo male with NYHA class 2-3 symptoms of severe CAD with normal LV function. Pt appears to have recovered the best he will after his admission and we will proceed with CABG x 5 on next Thursday. He will need to stop his plavix for 7 days starting this Thursday. All the risks and goals and recovery were discussed with pt and they wish to proceed. I have spent 40 min in review of the records, viewing studies and in face to face with patient and in coordination of future care    Eugenio Hoes 10/30/2022 7:07 PM

## 2022-10-31 ENCOUNTER — Other Ambulatory Visit: Payer: Self-pay | Admitting: *Deleted

## 2022-10-31 ENCOUNTER — Ambulatory Visit (INDEPENDENT_AMBULATORY_CARE_PROVIDER_SITE_OTHER): Payer: 59 | Admitting: Thoracic Surgery (Cardiothoracic Vascular Surgery)

## 2022-10-31 ENCOUNTER — Encounter: Payer: Self-pay | Admitting: *Deleted

## 2022-10-31 ENCOUNTER — Encounter: Payer: Self-pay | Admitting: Thoracic Surgery (Cardiothoracic Vascular Surgery)

## 2022-10-31 VITALS — BP 114/51 | HR 74 | Resp 20 | Ht 72.0 in | Wt 178.8 lb

## 2022-10-31 DIAGNOSIS — I25119 Atherosclerotic heart disease of native coronary artery with unspecified angina pectoris: Secondary | ICD-10-CM

## 2022-10-31 DIAGNOSIS — I2510 Atherosclerotic heart disease of native coronary artery without angina pectoris: Secondary | ICD-10-CM | POA: Insufficient documentation

## 2022-10-31 NOTE — Patient Instructions (Signed)
Stop plavix this Thursday CABG next Thursday

## 2022-11-01 ENCOUNTER — Other Ambulatory Visit: Payer: Self-pay

## 2022-11-01 ENCOUNTER — Telehealth: Payer: Self-pay | Admitting: Nurse Practitioner

## 2022-11-01 ENCOUNTER — Other Ambulatory Visit (HOSPITAL_COMMUNITY): Payer: Self-pay

## 2022-11-01 DIAGNOSIS — I25118 Atherosclerotic heart disease of native coronary artery with other forms of angina pectoris: Secondary | ICD-10-CM

## 2022-11-01 LAB — LIPID PANEL
Chol/HDL Ratio: 1.8 ratio (ref 0.0–5.0)
Cholesterol, Total: 88 mg/dL — ABNORMAL LOW (ref 100–199)
HDL: 49 mg/dL (ref >39)
LDL Chol Calc (NIH): 17 mg/dL (ref 0–99)
Triglycerides: 122 mg/dL (ref 0–149)
VLDL Cholesterol Cal: 22 mg/dL (ref 5–40)

## 2022-11-01 MED ORDER — ISOSORBIDE MONONITRATE ER 60 MG PO TB24
30.0000 mg | ORAL_TABLET | Freq: Every day | ORAL | 3 refills | Status: DC
Start: 2022-11-01 — End: 2022-11-21
  Filled 2022-11-01 (×2): qty 45, 90d supply, fill #0

## 2022-11-01 NOTE — Telephone Encounter (Signed)
Spoke with pt. Pt is going to increase Imdur 60 mg daily and will go to the ED for evaluation if his symptoms worsen. Medication sent to pts pharmacy.

## 2022-11-01 NOTE — Telephone Encounter (Signed)
Pt states that he would like for medication to be sent to [facility]

## 2022-11-01 NOTE — Telephone Encounter (Signed)
  Pt c/o medication issue:  1. Name of Medication: isosorbide mononitrate (IMDUR) 30 MG 24 hr tablet   2. How are you currently taking this medication (dosage and times per day)? Take 1 tablet (30 mg total) by mouth daily.   3. Are you having a reaction (difficulty breathing--STAT)? No   4. What is your medication issue? Pt is calling to let Irving Burton know, he would like to request an increase in his dose for isosorbide

## 2022-11-01 NOTE — Telephone Encounter (Signed)
Patient states he is still having pain and SOB.No increased. States on exertion. Seems worse late in the day. States the increase dose from 15 to 20 , he did not see any "noticeable improvement"  Took a  to walk out in the yard and took nitroglycerin.  States he had no pain but took it so he wouldn't. Advised to take nitro when has chest pain. He then states he only takes it with pain    States Dr Nevin Bloodgood is going to do heart surgery next Thursday. He has no pain at this moment just with exertion and end of day. He ask if the medication can be increased.

## 2022-11-07 ENCOUNTER — Other Ambulatory Visit: Payer: Self-pay | Admitting: Thoracic Surgery (Cardiothoracic Vascular Surgery)

## 2022-11-07 ENCOUNTER — Encounter: Payer: Self-pay | Admitting: Internal Medicine

## 2022-11-08 NOTE — Progress Notes (Signed)
Surgical Instructions    Your procedure is scheduled on November 10, 2022.  Report to [facility] Main Entrance "A" at 5:30 A.M., then check in with the Admitting office.  Call this number if you have problems the morning of surgery:  [phone]  If you have any questions prior to your surgery date call [phone]: Open Monday-Friday 8am-4pm If you experience any cold or flu symptoms such as cough, fever, chills, shortness of breath, etc. between now and your scheduled surgery, please notify us at the above number.     Remember:  Do not eat or drink after midnight the night before your surgery    Take these medicines the morning of surgery with A SIP OF WATER:  atorvastatin (LIPITOR)   diphenhydrAMINE (BENADRYL)   fenofibrate   hydrALAZINE (APRESOLINE)   isosorbide mononitrate (IMDUR)   metoprolol tartrate (LOPRESSOR)   PARoxetine (PAXIL)     May take these medicines IF NEEDED:  Carboxymethylcellulose Sodium (THERATEARS OP)   fluticasone (FLONASE) nasal spray   nitroGLYCERIN (NITROSTAT) - please call [phone] if med taken prior to surgery.   STOP taking your clopidogrel (PLAVIX) 7 days prior to surgery. Your last dose will be June 19th.   As of today, STOP taking any Aspirin (unless otherwise instructed by your surgeon) Aleve, Naproxen, Ibuprofen, Motrin, Advil, Goody's, BC's, all herbal medications, fish oil, and all vitamins.   WHAT DO I DO ABOUT MY DIABETES MEDICATION?   Do not take metFORMIN (GLUCOPHAGE-XR)  the morning of surgery.    HOW TO MANAGE YOUR DIABETES BEFORE AND AFTER SURGERY  Why is it important to control my blood sugar before and after surgery? Improving blood sugar levels before and after surgery helps healing and can limit problems. A way of improving blood sugar control is eating a healthy diet by:  Eating less sugar and carbohydrates  Increasing activity/exercise  Talking with your doctor about reaching your blood sugar goals High blood  sugars (greater than 180 mg/dL) can raise your risk of infections and slow your recovery, so you will need to focus on controlling your diabetes during the weeks before surgery. Make sure that the doctor who takes care of your diabetes knows about your planned surgery including the date and location.  How do I manage my blood sugar before surgery? Check your blood sugar at least 4 times a day, starting 2 days before surgery, to make sure that the level is not too high or low.  Check your blood sugar the morning of your surgery when you wake up and every 2 hours until you get to the Short Stay unit.  If your blood sugar is less than 70 mg/dL, you will need to treat for low blood sugar: Do not take insulin. Treat a low blood sugar (less than 70 mg/dL) with  cup of clear juice (cranberry or apple), 4 glucose tablets, OR glucose gel. Recheck blood sugar in 15 minutes after treatment (to make sure it is greater than 70 mg/dL). If your blood sugar is not greater than 70 mg/dL on recheck, call [phone] for further instructions. Report your blood sugar to the short stay nurse when you get to Short Stay.  If you are admitted to the hospital after surgery: Your blood sugar will be checked by the staff and you will probably be given insulin after surgery (instead of oral diabetes medicines) to make sure you have good blood sugar levels. The goal for blood sugar control after surgery is 80-180 mg/dL.  Do NOT Smoke (Tobacco/Vaping) for 24 hours prior to your procedure.  If you use a CPAP at night, you may bring your mask/headgear for your overnight stay.   Contacts, glasses, piercing's, hearing aid's, dentures or partials may not be worn into surgery, please bring cases for these belongings.    For patients admitted to the hospital, discharge time will be determined by your treatment team.   Patients discharged the day of surgery will not be allowed to drive home, and someone  needs to stay with them for 24 hours.  SURGICAL WAITING ROOM VISITATION Patients having surgery or a procedure may have no more than 2 support people in the waiting area - these visitors may rotate.   Children under the age of 34 must have an adult with them who is not the patient. If the patient needs to stay at the hospital during part of their recovery, the visitor guidelines for inpatient rooms apply. Pre-op nurse will coordinate an appropriate time for 1 support person to accompany patient in pre-op.  This support person may not rotate.   Please refer to the [facility] website for the visitor guidelines for Inpatients (after your surgery is over and you are in a regular room).    Special instructions:   [facility]- Preparing For Surgery  Before surgery, you can play an important role. Because skin is not sterile, your skin needs to be as free of germs as possible. You can reduce the number of germs on your skin by washing with CHG (chlorahexidine gluconate) Soap before surgery.  CHG is an antiseptic cleaner which kills germs and bonds with the skin to continue killing germs even after washing.    Oral Hygiene is also important to reduce your risk of infection.  Remember - BRUSH YOUR TEETH THE MORNING OF SURGERY WITH YOUR REGULAR TOOTHPASTE  Please do not use if you have an allergy to CHG or antibacterial soaps. If your skin becomes reddened/irritated stop using the CHG.  Do not shave (including legs and underarms) for at least 48 hours prior to first CHG shower. It is OK to shave your face.  Please follow these instructions carefully.   Shower the NIGHT BEFORE SURGERY and the MORNING OF SURGERY  If you chose to wash your hair, wash your hair first as usual with your normal shampoo.  After you shampoo, rinse your hair and body thoroughly to remove the shampoo.  Use CHG Soap as you would any other liquid soap. You can apply CHG directly to the skin and wash gently with a scrungie or  a clean washcloth.   Apply the CHG Soap to your body ONLY FROM THE NECK DOWN.  Do not use on open wounds or open sores. Avoid contact with your eyes, ears, mouth and genitals (private parts). Wash Face and genitals (private parts)  with your normal soap.   Wash thoroughly, paying special attention to the area where your surgery will be performed.  Thoroughly rinse your body with warm water from the neck down.  DO NOT shower/wash with your normal soap after using and rinsing off the CHG Soap.  Pat yourself dry with a CLEAN TOWEL.  Wear CLEAN PAJAMAS to bed the night before surgery  Place CLEAN SHEETS on your bed the night before your surgery  DO NOT SLEEP WITH PETS.   Day of Surgery: Take a shower with CHG soap. Do not wear jewelry or makeup Do not wear lotions, powders, perfumes/colognes, or deodorant. Do not shave 48  hours prior to surgery.  Men may shave face and neck. Do not bring valuables to the hospital.  [facility] is not responsible for any belongings or valuables. Do not wear nail polish, gel polish, artificial nails, or any other type of covering on natural nails (fingers and toes) If you have artificial nails or gel coating that need to be removed by a nail salon, please have this removed prior to surgery. Artificial nails or gel coating may interfere with anesthesia's ability to adequately monitor your vital signs.  Wear Clean/Comfortable clothing the morning of surgery Remember to brush your teeth WITH YOUR REGULAR TOOTHPASTE.   Please read over the following fact sheets that you were given.    If you received a COVID test during your pre-op visit  it is requested that you wear a mask when out in public, stay away from anyone that may not be feeling well and notify your surgeon if you develop symptoms. If you have been in contact with anyone that has tested positive in the last 10 days please notify you surgeon.

## 2022-11-09 ENCOUNTER — Other Ambulatory Visit (HOSPITAL_BASED_OUTPATIENT_CLINIC_OR_DEPARTMENT_OTHER): Payer: Self-pay

## 2022-11-09 ENCOUNTER — Encounter (HOSPITAL_COMMUNITY)
Admission: RE | Admit: 2022-11-09 | Discharge: 2022-11-09 | Disposition: A | Discharge status: Home / Self Care | Payer: 59 | Source: Ambulatory Visit | Attending: Thoracic Surgery (Cardiothoracic Vascular Surgery) | Admitting: Thoracic Surgery (Cardiothoracic Vascular Surgery)

## 2022-11-09 ENCOUNTER — Ambulatory Visit (HOSPITAL_COMMUNITY)
Admission: RE | Admit: 2022-11-09 | Discharge: 2022-11-09 | Disposition: A | Discharge status: Home / Self Care | Payer: 59 | Source: Ambulatory Visit | Attending: Thoracic Surgery (Cardiothoracic Vascular Surgery) | Admitting: Thoracic Surgery (Cardiothoracic Vascular Surgery)

## 2022-11-09 ENCOUNTER — Ambulatory Visit (HOSPITAL_BASED_OUTPATIENT_CLINIC_OR_DEPARTMENT_OTHER)
Admission: RE | Admit: 2022-11-09 | Discharge: 2022-11-09 | Disposition: A | Discharge status: Home / Self Care | Payer: 59 | Source: Ambulatory Visit | Attending: Thoracic Surgery (Cardiothoracic Vascular Surgery) | Admitting: Thoracic Surgery (Cardiothoracic Vascular Surgery)

## 2022-11-09 ENCOUNTER — Encounter (HOSPITAL_COMMUNITY): Payer: Self-pay

## 2022-11-09 ENCOUNTER — Other Ambulatory Visit: Payer: Self-pay

## 2022-11-09 VITALS — BP 142/72 | HR 61 | Temp 97.4°F | Resp 18 | Ht 71.0 in | Wt 182.5 lb

## 2022-11-09 DIAGNOSIS — I25119 Atherosclerotic heart disease of native coronary artery with unspecified angina pectoris: Secondary | ICD-10-CM | POA: Insufficient documentation

## 2022-11-09 DIAGNOSIS — E119 Type 2 diabetes mellitus without complications: Secondary | ICD-10-CM | POA: Insufficient documentation

## 2022-11-09 DIAGNOSIS — Z1152 Encounter for screening for COVID-19: Secondary | ICD-10-CM | POA: Insufficient documentation

## 2022-11-09 DIAGNOSIS — Z01818 Encounter for other preprocedural examination: Secondary | ICD-10-CM | POA: Insufficient documentation

## 2022-11-09 DIAGNOSIS — I130 Hypertensive heart and chronic kidney disease with heart failure and stage 1 through stage 4 chronic kidney disease, or unspecified chronic kidney disease: Secondary | ICD-10-CM | POA: Insufficient documentation

## 2022-11-09 HISTORY — DX: Type 2 diabetes mellitus without complications: E11.9

## 2022-11-09 HISTORY — DX: Atherosclerotic heart disease of native coronary artery without angina pectoris: I25.10

## 2022-11-09 HISTORY — DX: Angina pectoris, unspecified: I20.9

## 2022-11-09 HISTORY — DX: Gastro-esophageal reflux disease without esophagitis: K21.9

## 2022-11-09 HISTORY — DX: Heart failure, unspecified: I50.9

## 2022-11-09 LAB — URINALYSIS, ROUTINE W REFLEX MICROSCOPIC
Bilirubin Urine: NEGATIVE
Glucose, UA: NEGATIVE mg/dL
Hgb urine dipstick: NEGATIVE
Ketones, ur: NEGATIVE mg/dL
Leukocytes,Ua: NEGATIVE
Nitrite: NEGATIVE
Protein, ur: NEGATIVE mg/dL
Specific Gravity, Urine: 1.01 (ref 1.005–1.030)
pH: 7 (ref 5.0–8.0)

## 2022-11-09 LAB — HEMOGLOBIN A1C
Hgb A1c MFr Bld: 5.8 % — ABNORMAL HIGH (ref 4.8–5.6)
Mean Plasma Glucose: 120 mg/dL

## 2022-11-09 LAB — APTT: aPTT: 29 seconds (ref 24–36)

## 2022-11-09 LAB — COMPREHENSIVE METABOLIC PANEL
ALT: 26 U/L (ref 0–44)
AST: 29 U/L (ref 15–41)
Albumin: 4.2 g/dL (ref 3.5–5.0)
Alkaline Phosphatase: 71 U/L (ref 38–126)
Anion gap: 9 (ref 5–15)
BUN: 16 mg/dL (ref 8–23)
CO2: 22 mmol/L (ref 22–32)
Calcium: 9 mg/dL (ref 8.9–10.3)
Chloride: 106 mmol/L (ref 98–111)
Creatinine, Ser: 1.34 mg/dL — ABNORMAL HIGH (ref 0.61–1.24)
GFR, Estimated: 55 mL/min — ABNORMAL LOW (ref >60)
Glucose, Bld: 97 mg/dL (ref 70–99)
Potassium: 4.8 mmol/L (ref 3.5–5.1)
Sodium: 137 mmol/L (ref 135–145)
Total Bilirubin: 0.8 mg/dL (ref 0.3–1.2)
Total Protein: 6.7 g/dL (ref 6.5–8.1)

## 2022-11-09 LAB — BLOOD GAS, ARTERIAL
Acid-Base Excess: 0.5 mmol/L (ref 0.0–2.0)
Bicarbonate: 25.4 mmol/L (ref 20.0–28.0)
Drawn by: 587931
O2 Saturation: 99.5 %
Patient temperature: 37
pCO2 arterial: 41 mmHg (ref 32–48)
pH, Arterial: 7.4 (ref 7.35–7.45)
pO2, Arterial: 109 mmHg — ABNORMAL HIGH (ref 83–108)

## 2022-11-09 LAB — SURGICAL PCR SCREEN
MRSA, PCR: NEGATIVE
Staphylococcus aureus: NEGATIVE

## 2022-11-09 LAB — CBC
HCT: 36.6 % — ABNORMAL LOW (ref 39.0–52.0)
Hemoglobin: 12.1 g/dL — ABNORMAL LOW (ref 13.0–17.0)
MCH: 31.3 pg (ref 26.0–34.0)
MCHC: 33.1 g/dL (ref 30.0–36.0)
MCV: 94.8 fL (ref 80.0–100.0)
Platelets: 134 10*3/uL — ABNORMAL LOW (ref 150–400)
RBC: 3.86 MIL/uL — ABNORMAL LOW (ref 4.22–5.81)
RDW: 12.8 % (ref 11.5–15.5)
WBC: 6.7 10*3/uL (ref 4.0–10.5)
nRBC: 0 % (ref 0.0–0.2)

## 2022-11-09 LAB — GLUCOSE, CAPILLARY: Glucose-Capillary: 100 mg/dL — ABNORMAL HIGH (ref 70–99)

## 2022-11-09 LAB — TYPE AND SCREEN: ABO/RH(D): B NEG

## 2022-11-09 LAB — PROTIME-INR
INR: 1.1 (ref 0.8–1.2)
Prothrombin Time: 14.8 seconds (ref 11.4–15.2)

## 2022-11-09 LAB — SARS CORONAVIRUS 2 BY RT PCR: SARS Coronavirus 2 by RT PCR: NEGATIVE

## 2022-11-09 MED ORDER — TRANEXAMIC ACID (OHS) PUMP PRIME SOLUTION
2.0000 mg/kg | INTRAVENOUS | Status: DC
  Filled 2022-11-09: qty 1.66

## 2022-11-09 MED ORDER — LOSARTAN POTASSIUM 100 MG PO TABS
100.0000 mg | ORAL_TABLET | Freq: Every day | ORAL | 1 refills | Status: DC
  Filled 2022-11-09: qty 90, 90d supply, fill #0
  Filled 2023-03-24: qty 90, 90d supply, fill #1

## 2022-11-09 MED ORDER — POTASSIUM CHLORIDE 2 MEQ/ML IV SOLN
80.0000 meq | INTRAVENOUS | Status: DC
  Filled 2022-11-09: qty 40

## 2022-11-09 MED ORDER — PAROXETINE HCL 40 MG PO TABS
40.0000 mg | ORAL_TABLET | ORAL | 1 refills | Status: DC
  Filled 2022-11-09: qty 90, 90d supply, fill #0
  Filled 2023-03-04 (×2): qty 90, 90d supply, fill #1

## 2022-11-09 MED ORDER — TRANEXAMIC ACID 1000 MG/10ML IV SOLN
1.5000 mg/kg/h | INTRAVENOUS | Status: AC
  Administered 2022-11-10: 1.5 mg/kg/h via INTRAVENOUS
  Filled 2022-11-09: qty 25

## 2022-11-09 MED ORDER — DEXMEDETOMIDINE HCL IN NACL 400 MCG/100ML IV SOLN
0.1000 ug/kg/h | INTRAVENOUS | Status: AC
  Administered 2022-11-10: .4 ug/kg/h via INTRAVENOUS
  Filled 2022-11-09: qty 100

## 2022-11-09 MED ORDER — METFORMIN HCL ER 500 MG PO TB24
500.0000 mg | ORAL_TABLET | Freq: Every day | ORAL | 1 refills | Status: DC
  Filled 2022-11-09: qty 100, 100d supply, fill #0

## 2022-11-09 MED ORDER — CEFAZOLIN SODIUM-DEXTROSE 2-4 GM/100ML-% IV SOLN
2.0000 g | INTRAVENOUS | Status: AC
  Administered 2022-11-10: 2 g via INTRAVENOUS
  Filled 2022-11-09: qty 100

## 2022-11-09 MED ORDER — INSULIN REGULAR(HUMAN) IN NACL 100-0.9 UT/100ML-% IV SOLN
INTRAVENOUS | Status: AC
  Administered 2022-11-10: 1.2 [IU]/h via INTRAVENOUS
  Filled 2022-11-09: qty 100

## 2022-11-09 MED ORDER — MILRINONE LACTATE IN DEXTROSE 20-5 MG/100ML-% IV SOLN
0.3000 ug/kg/min | INTRAVENOUS | Status: DC
  Filled 2022-11-09: qty 100

## 2022-11-09 MED ORDER — HEPARIN 30,000 UNITS/1000 ML (OHS) CELLSAVER SOLUTION
Status: DC
  Filled 2022-11-09: qty 1000

## 2022-11-09 MED ORDER — PHENYLEPHRINE HCL-NACL 20-0.9 MG/250ML-% IV SOLN
30.0000 ug/min | INTRAVENOUS | Status: AC
  Administered 2022-11-10: 30 ug/min via INTRAVENOUS
  Filled 2022-11-09: qty 250

## 2022-11-09 MED ORDER — MAGNESIUM SULFATE 50 % IJ SOLN
40.0000 meq | INTRAMUSCULAR | Status: DC
  Filled 2022-11-09: qty 9.85

## 2022-11-09 MED ORDER — VANCOMYCIN HCL 1000 MG IV SOLR
INTRAVENOUS | Status: DC
  Filled 2022-11-09: qty 20

## 2022-11-09 MED ORDER — EPINEPHRINE HCL 5 MG/250ML IV SOLN IN NS
0.0000 ug/min | INTRAVENOUS | Status: DC
  Filled 2022-11-09: qty 250

## 2022-11-09 MED ORDER — FLUTICASONE PROPIONATE 50 MCG/ACT NA SUSP
2.0000 | Freq: Every day | NASAL | 1 refills | Status: AC
  Filled 2022-11-09: qty 48, 90d supply, fill #0
  Filled 2023-05-29: qty 48, 90d supply, fill #1

## 2022-11-09 MED ORDER — PLASMA-LYTE A IV SOLN
INTRAVENOUS | Status: DC
  Filled 2022-11-09: qty 2.5

## 2022-11-09 MED ORDER — FENOFIBRATE 160 MG PO TABS
160.0000 mg | ORAL_TABLET | Freq: Every day | ORAL | 1 refills | Status: DC
  Filled 2022-11-09: qty 90, 90d supply, fill #0
  Filled 2023-03-24: qty 90, 90d supply, fill #1

## 2022-11-09 MED ORDER — NOREPINEPHRINE 4 MG/250ML-% IV SOLN
0.0000 ug/min | INTRAVENOUS | Status: DC
  Filled 2022-11-09: qty 250

## 2022-11-09 MED ORDER — NITROGLYCERIN IN D5W 200-5 MCG/ML-% IV SOLN
2.0000 ug/min | INTRAVENOUS | Status: DC
  Filled 2022-11-09: qty 250

## 2022-11-09 MED ORDER — TRANEXAMIC ACID (OHS) BOLUS VIA INFUSION
15.0000 mg/kg | INTRAVENOUS | Status: AC
  Administered 2022-11-10: 1242 mg via INTRAVENOUS
  Filled 2022-11-09: qty 1242

## 2022-11-09 MED ORDER — VANCOMYCIN HCL 1250 MG/250ML IV SOLN
1250.0000 mg | INTRAVENOUS | Status: AC
  Administered 2022-11-10: 1500 mg via INTRAVENOUS
  Filled 2022-11-09: qty 250

## 2022-11-09 NOTE — Progress Notes (Signed)
PCP - Dr. Oliver Barre Cardiologist - Dr. Lennie Odor (Last office visit 10/05/2022) Neurologist - Dr. Delia Heady (Last office visit 05/26/2022. Pt had TIA in Nov. 2022)  PPM/ICD - Denies Device Orders - n/a Rep Notified - n/a  Chest x-ray - 11/09/2022 EKG - 10/28/2022 Stress Test - Denies ECHO - 07/09/2022 Cardiac Cath - 07/12/2022 CT Coronary - 07/11/2022  Sleep Study - Denies CPAP - n/a  Pt is DM2. He does not have a meter at home, therefore does not know his fasting glucose range. CBG at pre-op appointment 100. A1c result pending.  Last dose of GLP1 agonist- n/a GLP1 instructions: n/a  Blood Thinner Instructions: Per surgeon, pt stopped his Plavix 7 days prior to sx. Last dose was June 18th Aspirin Instructions: n/a  NPO after midnight  COVID TEST- Yes. Result pending.   Anesthesia review: Yes. Recent PNA with admission February 2024, Hx of TIAs, DM. Last dose of nitroglycerin was last night (6/25). Pt instructed that if CP is not relieved by nitroglycerin between today and tomorrow's surgery, he needs to call 911 immediately and be taken to the hospital. Pt understands instructions.   Patient denies shortness of breath, fever, cough and chest pain at PAT appointment. Pt denies any respiratory illness/infection in the last two months.   All instructions explained to the patient, with a verbal understanding of the material. Patient agrees to go over the instructions while at home for a better understanding. Patient also instructed to self quarantine after being tested for COVID-19. The opportunity to ask questions was provided.

## 2022-11-09 NOTE — Progress Notes (Signed)
Anesthesia Chart Review:  Case: [id number] Date/Time: 11/10/22 0715   Procedures:      CORONARY ARTERY BYPASS GRAFTING (CABG) (Chest)     TRANSESOPHAGEAL ECHOCARDIOGRAM   Anesthesia type: General   Pre-op diagnosis: CAD   Location: [facility] ROOM 15 / [facility]   Surgeons: Eugenio Hoes, MD       DISCUSSION: Patient is a 77 year old Bridges for the above procedure.  History includes former smoker, HTN, HLD, CAD, CHF, aortic stenosis (mild AS 06/2022), DM2, hepatitis C, CKD (stage 3), CVA (03/2021; 06/2022), GERD, pneumonia.  [facility] Admission 07/08/22-07/13/22 for RUL CAP. Labs showed elevated troponin concerning for NSTEMI. "Cardiology consulted. Transthoracic Echocardiogram obtained was significant for regional wall motion abnormalities of the left ventricle, and subsequent cardiac catheterization was significant for evidence of severe multivessel disease. Decision made to defer CABG referral until outpatient follow-up secondary to concurrent pneumonia. Patient completed antibiotic therapy prior to discharge." Of note a MRI of the brain had been ordered back in October 2023 by his neurologist Dr. Pearlean Brownie for new complaints of memory loss, mild cognitive impairment. It had not been done yet, but was done on 07/08/22 and showed small scattered cardioembolic strokes. Neurology was consulted. CTA of chest was negative for PE but posterior division of inferior pulmonary vein on the right and inferior pulmonary vein showing lack of opacification and extended to the orifice of the right inferior pulmonary vein and perhaps the periphery of the left atrium and moderately suspicious for thrombus. MRA had showed no emergent large vessel occlusion or high grade stenosis, unchanged diminutive right RCA and 1 x 2 mm ACA aneurysm.  He was placed on IV Heparin until pulmonary vein thrombosis ruled out. 07/11/22 CCTA showed no evidence of pulmonary vein thrombosis. He was discharged on ASA, Plavix, Lipitor with out-patient follow-up.     He has had neurology, primary care, cardiology, and CT surgery follow-up since discharge. Per 10/31/22 note by Dr. Leafy Ro, patient having to take nitroglycerin due to low level exertional angina and was eager to proceed with CABG. He was no longer coughing up phlegm. Imdur increased on 11/11/22. He reported last nitroglycerin 11/08/22 and acknowledged instructions when he should contact EMS.    He reported last Plavix 11/01/2022.  Presurgical COVID-19 test negative.  11/09/2022 A1c and chest x-ray results are still in process. Anesthesia team to evaluate on the day of surgery.    VS: BP (!) 142/72   Pulse 61   Temp (!) 36.3 C   Resp 18   Ht 5\' 11"  (1.803 m)   Wt 82.8 kg   SpO2 99%   BMI 25.45 kg/m    PROVIDERS: Corwin Levins, MD is PCP  Lennie Odor, MD is cardiologist Delia Heady, MD is neurologist   LABS: Labs reviewed: Acceptable for surgery. A1c pending. (all labs ordered are listed, but only abnormal results are displayed)  Labs Reviewed  GLUCOSE, CAPILLARY - Abnormal; Notable for the following components:      Result Value   Glucose-Capillary 100 (*)    All other components within normal limits  CBC - Abnormal; Notable for the following components:   RBC 3.86 (*)    Hemoglobin 12.1 (*)    HCT 36.6 (*)    Platelets 134 (*)    All other components within normal limits  COMPREHENSIVE METABOLIC PANEL - Abnormal; Notable for the following components:   Creatinine, Ser 1.34 (*)    GFR, Estimated 55 (*)    All other components within normal limits  BLOOD GAS, ARTERIAL - Abnormal; Notable for the following components:   pO2, Arterial 109 (*)    All other components within normal limits  SURGICAL PCR SCREEN  SARS CORONAVIRUS 2 BY RT PCR  PROTIME-INR  APTT  URINALYSIS, ROUTINE W REFLEX MICROSCOPIC  HEMOGLOBIN A1C  TYPE AND SCREEN    IMAGES: CXR 11/09/22: In process.  CT Chest 07/11/22: IMPRESSION: - Stable right upper lobe airspace opacity is noted concerning  for pneumonia. - Stable 5 mm nodule is noted in left lower lobe. Although likely benign, if the patient is high-risk, given the morphology and/or location of this nodule a non-contrast chest CT can be considered in 12 months.This recommendation follows the consensus statement: Guidelines for Management of Incidental Pulmonary Nodules Detected on CT Images: From the Fleischner Society 2017; Radiology 2017; 284:228-243. - Coronary artery calcifications are noted suggesting coronary artery disease. - Hepatic cirrhosis. - Aortic Atherosclerosis (ICD10-I70.0).   EKG: 10/28/22: NSR   CV: Cardiac cath 07/12/22:   Ost LAD lesion is 70% stenosed.  1st Sept lesion is 70% stenosed.   Mid LAD lesion is 50% stenosed with 70% stenosed side branch in 1st Diag.   Mid Cx lesion is 95% stenosed.  1st Mrg lesion is 85% stenosed.   Dist Cx lesion is 85% stenosed.  LPAV lesion is 80% stenosed.   Prox RCA lesion is 95% stenosed.  Mid RCA-1 lesion is 80% stenosed. Mid RCA-2 lesion is 85% stenosed.   LV end diastolic pressure is mildly elevated.   There is no aortic valve stenosis.   POST-CATH SUMMARY Severe multivessel CAD involving LAD/D1, LCx, OM1, with extensive disease in small codominant RCA => not favorable from a percutaneous approach. There are distal targets in the LAD, D1, OM1, L PDA/PL 2, however the RCA is likely too small for bypassing. Mildly elevated LVEDP but relatively normal EF by 2D Echo     RECOMMENDATIONS. Return to nursing unit for ongoing care, will need to continue treatment for pneumonia.  From my preop standpoint, it may be best for him to be discharged home and heal from his ongoing illness prior to consideration of CVTS consultation for CABG.    CT Coronary 07/11/22: IMPRESSION: 1. Coronary calcium score of 1963. This was 45 percentile for age and sex matched control. 2.  Normal coronary origin with right dominance. 3. Severe proximal LAD, proximal large OM1, and proximal  RCA disease (70-99% stenosis). FFR compatible with multivessel flow limiting disease. 4. Aortic valve calcium score 3505: Compatible with severe aortic stenosis. Echocardiogram 06/02/22 showed mild aortic stenosis. 5.  Aortic atherosclerosis. 6.  No evidence of pulmonary vein thrombosis.   US Carotid 07/11/22: Summary:  Right Carotid: Velocities in the right ICA are consistent with a 1-39%  stenosis.  Left Carotid: Velocities in the left ICA are consistent with a 1-39%  stenosis.  Vertebrals: Bilateral vertebral arteries demonstrate antegrade flow.  Subclavians: Normal flow hemodynamics were seen in bilateral subclavian               arteries.    Echo 07/09/22: IMPRESSIONS   1. Left ventricular ejection fraction, by estimation, is 60 to 65%. The  left ventricle has normal function. The left ventricle demonstrates  regional wall motion abnormalities (see scoring diagram/findings for  description). Left ventricular diastolic  parameters are consistent with Grade I diastolic dysfunction (impaired  relaxation).   2. Right ventricular systolic function is hyperdynamic. The right  ventricular size is mildly enlarged.   3. Calcified subchordal appatatus and  previously describev. The mitral  valve is abnormal. Trivial mitral valve regurgitation. No evidence of  mitral stenosis. Moderate mitral annular calcification.   4. The aortic valve is abnormal. Aortic valve regurgitation is not  visualized. Mild aortic valve stenosis.   5. The pulmonary veins are not sufficiently imaged to evalution for  pulmonary vein thrombus based off of this study.    Past Medical History:  Diagnosis Date   Allergy    seasonal   Anginal pain (HCC)    Anxiety    CHF (congestive heart failure) (HCC)    Coronary artery disease    Depression    Diabetes mellitus without complication (HCC)    Type 2   GERD (gastroesophageal reflux disease)    Pt states "take ambien for this"   Hepatitis C    HLD  (hyperlipidemia) 08/09/2017   Hypertension    Hypoglycemia    Pneumonia 06/2022   Stroke (HCC) 03/2021   TIA    Past Surgical History:  Procedure Laterality Date   APPENDECTOMY     BUBBLE STUDY  06/16/2021   Procedure: BUBBLE STUDY;  Surgeon: Sande Rives, MD;  Location: [facility] ENDOSCOPY;  Service: Cardiovascular;;   COLONOSCOPY     FRACTURE SURGERY Left    Ankle   LEFT HEART CATH AND CORONARY ANGIOGRAPHY N/A 07/12/2022   Procedure: LEFT HEART CATH AND CORONARY ANGIOGRAPHY;  Surgeon: Marykay Lex, MD;  Location: [facility] INVASIVE CV LAB;  Service: Cardiovascular;  Laterality: N/A;   TEE WITHOUT CARDIOVERSION N/A 06/16/2021   Procedure: TRANSESOPHAGEAL ECHOCARDIOGRAM (TEE);  Surgeon: Sande Rives, MD;  Location: [facility] ENDOSCOPY;  Service: Cardiovascular;  Laterality: N/A;   TONSILECTOMY, ADENOIDECTOMY, BILATERAL MYRINGOTOMY AND TUBES  1955    MEDICATIONS:  atorvastatin (LIPITOR) 80 MG tablet   Carboxymethylcellulose Sodium (THERATEARS OP)   cholecalciferol (VITAMIN D3) 25 MCG (1000 UNIT) tablet   clopidogrel (PLAVIX) 75 MG tablet   diphenhydrAMINE (BENADRYL) 25 MG tablet   Evolocumab (REPATHA SURECLICK) 140 MG/ML SOAJ   fenofibrate 160 MG tablet   fluticasone (FLONASE) 50 MCG/ACT nasal spray   gabapentin (NEURONTIN) 300 MG capsule   Homeopathic Products (THERAWORX MUSCLE CRAMPS EX)   hydrALAZINE (APRESOLINE) 25 MG tablet   isosorbide mononitrate (IMDUR) 60 MG 24 hr tablet   losartan (COZAAR) 100 MG tablet   Magnesium 500 MG CAPS   metFORMIN (GLUCOPHAGE-XR) 500 MG 24 hr tablet   metoprolol tartrate (LOPRESSOR) 25 MG tablet   Multiple Vitamins-Minerals (HAIR/SKIN/NAILS/BIOTIN PO)   nitroGLYCERIN (NITROSTAT) 0.4 MG SL tablet   PARoxetine (PAXIL) 40 MG tablet   vitamin E 180 MG (400 UNITS) capsule   zolpidem (AMBIEN) 10 MG tablet   No current facility-administered medications for this encounter.    [START ON 11/10/2022] ceFAZolin (ANCEF) IVPB 2g/100 mL premix    [START ON 11/10/2022] ceFAZolin (ANCEF) IVPB 2g/100 mL premix   [START ON 11/10/2022] dexmedetomidine (PRECEDEX) 400 MCG/100ML (4 mcg/mL) infusion   [START ON 11/10/2022] EPINEPHrine (ADRENALIN) 5 mg in NS 250 mL (0.02 mg/mL) premix infusion   [START ON 11/10/2022] heparin 30,000 units/NS 1000 mL solution for CELLSAVER   [START ON 11/10/2022] heparin sodium (porcine) 2,500 Units, papaverine 30 mg in electrolyte-A (PLASMALYTE-A PH 7.4) 500 mL irrigation   [START ON 11/10/2022] insulin regular, human (MYXREDLIN) 100 units/ 100 mL infusion   [START ON 11/10/2022] magnesium sulfate (IV Push/IM) injection 40 mEq   [START ON 11/10/2022] milrinone (PRIMACOR) 20 MG/100 ML (0.2 mg/mL) infusion   [START ON 11/10/2022] nitroGLYCERIN 50 mg in dextrose  5 % 250 mL (0.2 mg/mL) infusion   [START ON 11/10/2022] norepinephrine (LEVOPHED) 4mg  in (0.016 mg/mL) premix infusion   [START ON 11/10/2022] phenylephrine (NEO-SYNEPHRINE) 20mg /NS premix infusion   [START ON 11/10/2022] potassium chloride injection 80 mEq   [START ON 11/10/2022] tranexamic acid (CYKLOKAPRON) 2,500 mg in sodium chloride 0.9 % 250 mL (10 mg/mL) infusion   [START ON 11/10/2022] tranexamic acid (CYKLOKAPRON) bolus via infusion - over 30 minutes 1,242 mg   [START ON 11/10/2022] tranexamic acid (CYKLOKAPRON) pump prime solution 166 mg   [START ON 11/10/2022] vancomycin (VANCOCIN) 1,000 mg in sodium chloride 0.9 % 1,000 mL irrigation   [START ON 11/10/2022] vancomycin (VANCOREADY) IVPB 1250 mg/250 mL    Shonna Chock, PA-C Surgical Short Stay/Anesthesiology [facility] Phone [phone] [facility] Phone [phone] 11/09/2022 4:17 PM

## 2022-11-09 NOTE — H&P (Signed)
[address]       [place] [address]             [phone]                                   [name] [place] Medical Record #[id number] Date of Birth: 01-10-1946   Corwin Levins, MD Corwin Levins, MD   Chief Complaint:  Angina   History of Present Illness:     Pt known to me from consult earlier this year following work up in hospital following sepsis/pnuemonia with NSTEMI and cath with severe CAD. Pt with normal LV function at that time. Pt required extensive rehab after deconditioning. Pt now has low level exertional angina to where he is taking NTG and is unable to do much walking secondary to fatigue also. He no longer is coughing up phlegm. He is eager to proceed with CABG             Past Medical History:  Diagnosis Date   Allergy      seasonal   Anxiety     Depression     Headache     Hepatitis C     HLD (hyperlipidemia) 08/09/2017   Hypertension     Hypoglycemia     Stroke [facility])             Past Surgical History:  Procedure Laterality Date   APPENDECTOMY       BUBBLE STUDY   06/16/2021    Procedure: BUBBLE STUDY;  Surgeon: Sande Rives, MD;  Location: [facility] ENDOSCOPY;  Service: Cardiovascular;;   LEFT HEART CATH AND CORONARY ANGIOGRAPHY N/A 07/12/2022    Procedure: LEFT HEART CATH AND CORONARY ANGIOGRAPHY;  Surgeon: Marykay Lex, MD;  Location: [facility] INVASIVE CV LAB;  Service: Cardiovascular;  Laterality: N/A;   TEE WITHOUT CARDIOVERSION N/A 06/16/2021    Procedure: TRANSESOPHAGEAL ECHOCARDIOGRAM (TEE);  Surgeon: Sande Rives, MD;  Location: [facility] ENDOSCOPY;  Service: Cardiovascular;  Laterality: N/A;   TONSILECTOMY, ADENOIDECTOMY, BILATERAL MYRINGOTOMY AND TUBES          Social History        Tobacco Use  Smoking Status Former  Smokeless Tobacco Never  Tobacco Comments    quit 70yrs ago, smoke a cigar occassionally.    Social History       Substance and Sexual Activity  Alcohol Use No       Social History         Socioeconomic History   Marital status: Single      Spouse name: Not on file   Number of children: 0   Years of education: some college   Highest education level: Associate degree: occupational, Scientist, product/process development, or vocational program  Occupational History   Occupation: retired  Tobacco Use   Smoking status: Former   Smokeless tobacco: Never   Tobacco comments:      quit 77yrs ago, smoke a cigar occassionally.  Substance and Sexual Activity   Alcohol use: No   Drug use: No   Sexual activity: Yes  Other Topics Concern   Not on file  Social History Narrative    Lives alone.    Right-handed.    Two cups caffeine per day.    Social Determinants of Health        Financial Resource Strain: Low Risk  (09/29/2022)  Overall Financial Resource Strain (CARDIA)     Difficulty of Paying Living Expenses: Not hard at all  Food Insecurity: No Food Insecurity (09/29/2022)    Hunger Vital Sign     Worried About Running Out of Food in the Last Year: Never true     Ran Out of Food in the Last Year: Never true  Transportation Needs: No Transportation Needs (09/29/2022)    PRAPARE - Therapist, art (Medical): No     Lack of Transportation (Non-Medical): No  Physical Activity: Insufficiently Active (09/29/2022)    Exercise Vital Sign     Days of Exercise per Week: 1 day     Minutes of Exercise per Session: 10 min  Stress: No Stress Concern Present (09/29/2022)    [employer] of Occupational Health - Occupational Stress Questionnaire     Feeling of Stress : Not at all  Social Connections: Socially Isolated (09/29/2022)    Social Connection and Isolation Panel [NHANES]     Frequency of Communication with Friends and Family: More than three times a week     Frequency of Social Gatherings with Friends and Family: Once a week     Attends Religious Services: Never     Database administrator or Organizations: No     Attends Hospital doctor: Not on file     Marital Status: Divorced  Intimate Partner Violence: Not At Risk (07/09/2022)    Humiliation, Afraid, Rape, and Kick questionnaire     Fear of Current or Ex-Partner: No     Emotionally Abused: No     Physically Abused: No     Sexually Abused: No      No Known Allergies         Current Outpatient Medications  Medication Sig Dispense Refill   atorvastatin (LIPITOR) 80 MG tablet Take 1 tablet (80 mg total) by mouth daily. 30 tablet 2   cholecalciferol (VITAMIN D3) 25 MCG (1000 UNIT) tablet Take 1 tablet (1,000 Units total) by mouth daily. Take 1,000 Units by mouth daily. 90 tablet 2   clopidogrel (PLAVIX) 75 MG tablet TAKE 1 TABLET BY MOUTH DAILY 60 tablet 5   Evolocumab (REPATHA SURECLICK) 140 MG/ML SOAJ Inject 140 mg into the skin every 14 days. 6 mL 3   fenofibrate 160 MG tablet Take 1 tablet (160 mg total) by mouth daily. 90 tablet 3   fluticasone (FLONASE) 50 MCG/ACT nasal spray USE 2 SPRAYS IN EACH NOSTRIL DAILY (Patient taking differently: Place 2 sprays into both nostrils daily. USE 2 SPRAYS IN EACH NOSTRIL DAILY) 48 g 2   gabapentin (NEURONTIN) 300 MG capsule Take 1 capsule (300 mg total) by mouth at bedtime. 90 capsule 3   hydrALAZINE (APRESOLINE) 25 MG tablet Take 1 tablet (25 mg total) by mouth every 8 (eight) hours. 90 tablet 2   isosorbide mononitrate (IMDUR) 30 MG 24 hr tablet Take 1 tablet (30 mg total) by mouth daily. 90 tablet 3   losartan (COZAAR) 100 MG tablet TAKE 1 TABLET BY MOUTH DAILY 60 tablet 5   metFORMIN (GLUCOPHAGE-XR) 500 MG 24 hr tablet TAKE 1 TABLET BY MOUTH ONCE  DAILY WITH BREAKFAST 60 tablet 5   metoprolol tartrate (LOPRESSOR) 25 MG tablet Take 0.5 tablets (12.5 mg total) by mouth 2 (two) times daily. 60 tablet 1   Multiple Vitamins-Minerals (HAIR/SKIN/NAILS/BIOTIN PO) Take 1 tablet by mouth daily.       nitroGLYCERIN (NITROSTAT) 0.4 MG SL tablet Place  1 tablet under the tongue every 5 minutes as needed for chest pain. 25 tablet 3    PARoxetine (PAXIL) 40 MG tablet TAKE 1 TABLET(40 MG) BY MOUTH EVERY MORNING (Patient taking differently: Take 40 mg by mouth every morning.) 90 tablet 3   vitamin E 180 MG (400 UNITS) capsule Take 400 Units by mouth daily.       zolpidem (AMBIEN) 10 MG tablet TAKE 1 TABLET(10 MG) BY MOUTH AT BEDTIME AS NEEDED FOR SLEEP 90 tablet 1    No current facility-administered medications for this visit.             Family History  Problem Relation Age of Onset   Lung disease Mother     Hyperlipidemia Father     Hypertension Father              Physical Exam: Lungs: clear Card: rr Ext: no edema Neuro: alert and without focal issues         Diagnostic Studies & Laboratory data: I have personally reviewed the following studies and agree with the findings     Recent Radiology Findings:        Recent Lab Findings: Recent Labs       Lab Results  Component Value Date    WBC 4.1 07/13/2022    HGB 11.9 (L) 07/13/2022    HCT 34.6 (L) 07/13/2022    PLT 96 (L) 07/13/2022    GLUCOSE 140 (H) 07/13/2022    CHOL 165 08/26/2022    TRIG 159 (H) 08/26/2022    HDL 48 08/26/2022    LDLDIRECT 146.0 06/03/2022    LDLCALC 89 08/26/2022    ALT 25 08/26/2022    AST 29 08/26/2022    NA 137 07/13/2022    K 3.7 07/13/2022    CL 107 07/13/2022    CREATININE 1.36 (H) 07/13/2022    BUN 17 07/13/2022    CO2 23 07/13/2022    TSH 0.98 06/03/2022    INR 1.0 04/07/2021    HGBA1C 6.3 (H) 07/09/2022            Assessment / Plan:     77 yo male with NYHA class 2-3 symptoms of severe CAD with normal LV function. Pt appears to have recovered the best he will after his admission and we will proceed with CABG x 5 on next Thursday. He will need to stop his plavix for 7 days starting this Thursday. All the risks and goals and recovery were discussed with pt and they wish to proceed.

## 2022-11-09 NOTE — Anesthesia Preprocedure Evaluation (Signed)
Anesthesia Evaluation  Patient identified by MRN, date of birth, ID band Patient awake    Reviewed: Allergy & Precautions, NPO status , Patient's Chart, lab work & pertinent test results  Airway Mallampati: III  TM Distance: >3 FB Neck ROM: Full  Mouth opening: Limited Mouth Opening  Dental  (+) Dental Advisory Given, Edentulous Upper   Pulmonary former smoker   breath sounds clear to auscultation       Cardiovascular hypertension, Pt. on medications and Pt. on home beta blockers + CAD, + Past MI and +CHF   Rhythm:Regular Rate:Normal     Neuro/Psych  Headaches CVA    GI/Hepatic ,GERD  ,,(+) Hepatitis -, C  Endo/Other  diabetes, Type 2, Oral Hypoglycemic Agents    Renal/GU CRFRenal disease     Musculoskeletal   Abdominal   Peds  Hematology negative hematology ROS (+)   Anesthesia Other Findings   Reproductive/Obstetrics                             Anesthesia Physical Anesthesia Plan  ASA: 4  Anesthesia Plan: General   Post-op Pain Management:    Induction: Intravenous  PONV Risk Score and Plan: 2 and Dexamethasone, Ondansetron and Treatment may vary due to age or medical condition  Airway Management Planned: Oral ETT  Additional Equipment: Arterial line, CVP, PA Cath, TEE and Ultrasound Guidance Line Placement  Intra-op Plan:   Post-operative Plan: Post-operative intubation/ventilation  Informed Consent: I have reviewed the patients History and Physical, chart, labs and discussed the procedure including the risks, benefits and alternatives for the proposed anesthesia with the patient or authorized representative who has indicated his/her understanding and acceptance.       Plan Discussed with: CRNA  Anesthesia Plan Comments: (  )       Anesthesia Quick Evaluation

## 2022-11-10 ENCOUNTER — Other Ambulatory Visit: Payer: Self-pay

## 2022-11-10 ENCOUNTER — Inpatient Hospital Stay (HOSPITAL_COMMUNITY)
Admission: RE | Disposition: A | Discharge status: Skilled Nursing Facility | Payer: Self-pay | Source: Ambulatory Visit | Attending: Thoracic Surgery (Cardiothoracic Vascular Surgery)

## 2022-11-10 ENCOUNTER — Inpatient Hospital Stay (HOSPITAL_COMMUNITY): Payer: 59

## 2022-11-10 ENCOUNTER — Inpatient Hospital Stay (HOSPITAL_COMMUNITY): Payer: 59 | Admitting: Certified Registered Nurse Anesthetist

## 2022-11-10 ENCOUNTER — Inpatient Hospital Stay (HOSPITAL_COMMUNITY)
Admission: RE | Admit: 2022-11-10 | Discharge: 2022-11-21 | DRG: 235 | Disposition: A | Discharge status: Skilled Nursing Facility | Payer: 59 | Source: Ambulatory Visit | Attending: Thoracic Surgery (Cardiothoracic Vascular Surgery) | Admitting: Thoracic Surgery (Cardiothoracic Vascular Surgery)

## 2022-11-10 ENCOUNTER — Encounter (HOSPITAL_COMMUNITY): Payer: Self-pay | Admitting: Thoracic Surgery (Cardiothoracic Vascular Surgery)

## 2022-11-10 ENCOUNTER — Other Ambulatory Visit (HOSPITAL_COMMUNITY): Payer: Self-pay

## 2022-11-10 DIAGNOSIS — Z79899 Other long term (current) drug therapy: Secondary | ICD-10-CM | POA: Diagnosis not present

## 2022-11-10 DIAGNOSIS — G2581 Restless legs syndrome: Secondary | ICD-10-CM | POA: Diagnosis present

## 2022-11-10 DIAGNOSIS — Z951 Presence of aortocoronary bypass graft: Secondary | ICD-10-CM

## 2022-11-10 DIAGNOSIS — Z8249 Family history of ischemic heart disease and other diseases of the circulatory system: Secondary | ICD-10-CM | POA: Diagnosis not present

## 2022-11-10 DIAGNOSIS — I252 Old myocardial infarction: Secondary | ICD-10-CM

## 2022-11-10 DIAGNOSIS — J9589 Other postprocedural complications and disorders of respiratory system, not elsewhere classified: Secondary | ICD-10-CM | POA: Diagnosis not present

## 2022-11-10 DIAGNOSIS — N1832 Chronic kidney disease, stage 3b: Secondary | ICD-10-CM | POA: Diagnosis present

## 2022-11-10 DIAGNOSIS — E1122 Type 2 diabetes mellitus with diabetic chronic kidney disease: Secondary | ICD-10-CM | POA: Diagnosis present

## 2022-11-10 DIAGNOSIS — J9602 Acute respiratory failure with hypercapnia: Secondary | ICD-10-CM | POA: Diagnosis not present

## 2022-11-10 DIAGNOSIS — J9601 Acute respiratory failure with hypoxia: Secondary | ICD-10-CM | POA: Diagnosis not present

## 2022-11-10 DIAGNOSIS — I480 Paroxysmal atrial fibrillation: Secondary | ICD-10-CM | POA: Diagnosis not present

## 2022-11-10 DIAGNOSIS — J9811 Atelectasis: Secondary | ICD-10-CM | POA: Diagnosis present

## 2022-11-10 DIAGNOSIS — Z8673 Personal history of transient ischemic attack (TIA), and cerebral infarction without residual deficits: Secondary | ICD-10-CM

## 2022-11-10 DIAGNOSIS — L8990 Pressure ulcer of unspecified site, unspecified stage: Secondary | ICD-10-CM | POA: Insufficient documentation

## 2022-11-10 DIAGNOSIS — I25110 Atherosclerotic heart disease of native coronary artery with unstable angina pectoris: Secondary | ICD-10-CM | POA: Diagnosis not present

## 2022-11-10 DIAGNOSIS — I25119 Atherosclerotic heart disease of native coronary artery with unspecified angina pectoris: Secondary | ICD-10-CM | POA: Diagnosis not present

## 2022-11-10 DIAGNOSIS — N189 Chronic kidney disease, unspecified: Secondary | ICD-10-CM

## 2022-11-10 DIAGNOSIS — E119 Type 2 diabetes mellitus without complications: Secondary | ICD-10-CM

## 2022-11-10 DIAGNOSIS — E11649 Type 2 diabetes mellitus with hypoglycemia without coma: Secondary | ICD-10-CM | POA: Diagnosis not present

## 2022-11-10 DIAGNOSIS — Z8639 Personal history of other endocrine, nutritional and metabolic disease: Secondary | ICD-10-CM

## 2022-11-10 DIAGNOSIS — I5033 Acute on chronic diastolic (congestive) heart failure: Secondary | ICD-10-CM | POA: Diagnosis not present

## 2022-11-10 DIAGNOSIS — Z7984 Long term (current) use of oral hypoglycemic drugs: Secondary | ICD-10-CM

## 2022-11-10 DIAGNOSIS — Z7902 Long term (current) use of antithrombotics/antiplatelets: Secondary | ICD-10-CM

## 2022-11-10 DIAGNOSIS — I2510 Atherosclerotic heart disease of native coronary artery without angina pectoris: Secondary | ICD-10-CM

## 2022-11-10 DIAGNOSIS — I130 Hypertensive heart and chronic kidney disease with heart failure and stage 1 through stage 4 chronic kidney disease, or unspecified chronic kidney disease: Secondary | ICD-10-CM

## 2022-11-10 DIAGNOSIS — E785 Hyperlipidemia, unspecified: Secondary | ICD-10-CM | POA: Diagnosis present

## 2022-11-10 DIAGNOSIS — I509 Heart failure, unspecified: Secondary | ICD-10-CM

## 2022-11-10 DIAGNOSIS — N179 Acute kidney failure, unspecified: Secondary | ICD-10-CM | POA: Diagnosis not present

## 2022-11-10 DIAGNOSIS — E1136 Type 2 diabetes mellitus with diabetic cataract: Secondary | ICD-10-CM | POA: Diagnosis present

## 2022-11-10 DIAGNOSIS — Z87891 Personal history of nicotine dependence: Secondary | ICD-10-CM

## 2022-11-10 DIAGNOSIS — D6959 Other secondary thrombocytopenia: Secondary | ICD-10-CM | POA: Diagnosis not present

## 2022-11-10 DIAGNOSIS — I700 Atherosclerosis of aorta: Secondary | ICD-10-CM | POA: Diagnosis present

## 2022-11-10 DIAGNOSIS — Z1152 Encounter for screening for COVID-19: Secondary | ICD-10-CM | POA: Diagnosis not present

## 2022-11-10 DIAGNOSIS — Z9049 Acquired absence of other specified parts of digestive tract: Secondary | ICD-10-CM | POA: Diagnosis not present

## 2022-11-10 DIAGNOSIS — K5900 Constipation, unspecified: Secondary | ICD-10-CM | POA: Diagnosis not present

## 2022-11-10 DIAGNOSIS — R1310 Dysphagia, unspecified: Secondary | ICD-10-CM | POA: Diagnosis not present

## 2022-11-10 HISTORY — PX: CORONARY ARTERY BYPASS GRAFT: SHX141

## 2022-11-10 HISTORY — PX: TEE WITHOUT CARDIOVERSION: SHX5443

## 2022-11-10 LAB — POCT I-STAT 7, (LYTES, BLD GAS, ICA,H+H)
Acid-Base Excess: 0 mmol/L (ref 0.0–2.0)
Acid-base deficit: 1 mmol/L (ref 0.0–2.0)
Acid-base deficit: 3 mmol/L — ABNORMAL HIGH (ref 0.0–2.0)
Acid-base deficit: 1 mmol/L (ref 0.0–2.0)
Acid-base deficit: 3 mmol/L — ABNORMAL HIGH (ref 0.0–2.0)
Acid-base deficit: 4 mmol/L — ABNORMAL HIGH (ref 0.0–2.0)
Bicarbonate: 24.7 mmol/L (ref 20.0–28.0)
Bicarbonate: 22.2 mmol/L (ref 20.0–28.0)
Bicarbonate: 23.6 mmol/L (ref 20.0–28.0)
Bicarbonate: 22 mmol/L (ref 20.0–28.0)
Bicarbonate: 23 mmol/L (ref 20.0–28.0)
Bicarbonate: 20.3 mmol/L (ref 20.0–28.0)
Calcium, Ion: 1.24 mmol/L (ref 1.15–1.40)
Calcium, Ion: 0.99 mmol/L — ABNORMAL LOW (ref 1.15–1.40)
Calcium, Ion: 1.02 mmol/L — ABNORMAL LOW (ref 1.15–1.40)
Calcium, Ion: 0.99 mmol/L — ABNORMAL LOW (ref 1.15–1.40)
Calcium, Ion: 1.01 mmol/L — ABNORMAL LOW (ref 1.15–1.40)
Calcium, Ion: 1.04 mmol/L — ABNORMAL LOW (ref 1.15–1.40)
HCT: 33 % — ABNORMAL LOW (ref 39.0–52.0)
HCT: 21 % — ABNORMAL LOW (ref 39.0–52.0)
HCT: 21 % — ABNORMAL LOW (ref 39.0–52.0)
HCT: 19 % — ABNORMAL LOW (ref 39.0–52.0)
HCT: 19 % — ABNORMAL LOW (ref 39.0–52.0)
HCT: 20 % — ABNORMAL LOW (ref 39.0–52.0)
Hemoglobin: 11.2 g/dL — ABNORMAL LOW (ref 13.0–17.0)
Hemoglobin: 7.1 g/dL — ABNORMAL LOW (ref 13.0–17.0)
Hemoglobin: 7.1 g/dL — ABNORMAL LOW (ref 13.0–17.0)
Hemoglobin: 6.5 g/dL — CL (ref 13.0–17.0)
Hemoglobin: 6.5 g/dL — CL (ref 13.0–17.0)
Hemoglobin: 6.8 g/dL — CL (ref 13.0–17.0)
O2 Saturation: 100 %
O2 Saturation: 100 %
O2 Saturation: 100 %
O2 Saturation: 100 %
O2 Saturation: 100 %
O2 Saturation: 100 %
Patient temperature: 35.6
Potassium: 4.3 mmol/L (ref 3.5–5.1)
Potassium: 4.6 mmol/L (ref 3.5–5.1)
Potassium: 5.7 mmol/L — ABNORMAL HIGH (ref 3.5–5.1)
Potassium: 4.8 mmol/L (ref 3.5–5.1)
Potassium: 5.6 mmol/L — ABNORMAL HIGH (ref 3.5–5.1)
Potassium: 4.3 mmol/L (ref 3.5–5.1)
Sodium: 142 mmol/L (ref 135–145)
Sodium: 141 mmol/L (ref 135–145)
Sodium: 140 mmol/L (ref 135–145)
Sodium: 137 mmol/L (ref 135–145)
Sodium: 140 mmol/L (ref 135–145)
Sodium: 141 mmol/L (ref 135–145)
TCO2: 26 mmol/L (ref 22–32)
TCO2: 23 mmol/L (ref 22–32)
TCO2: 25 mmol/L (ref 22–32)
TCO2: 23 mmol/L (ref 22–32)
TCO2: 24 mmol/L (ref 22–32)
TCO2: 21 mmol/L — ABNORMAL LOW (ref 22–32)
pCO2 arterial: 42.5 mmHg (ref 32–48)
pCO2 arterial: 39.4 mmHg (ref 32–48)
pCO2 arterial: 33.8 mmHg (ref 32–48)
pCO2 arterial: 32.6 mmHg (ref 32–48)
pCO2 arterial: 38.3 mmHg (ref 32–48)
pCO2 arterial: 29.7 mmHg — ABNORMAL LOW (ref 32–48)
pH, Arterial: 7.371 (ref 7.35–7.45)
pH, Arterial: 7.358 (ref 7.35–7.45)
pH, Arterial: 7.453 — ABNORMAL HIGH (ref 7.35–7.45)
pH, Arterial: 7.368 (ref 7.35–7.45)
pH, Arterial: 7.457 — ABNORMAL HIGH (ref 7.35–7.45)
pH, Arterial: 7.437 (ref 7.35–7.45)
pO2, Arterial: 337 mmHg — ABNORMAL HIGH (ref 83–108)
pO2, Arterial: 369 mmHg — ABNORMAL HIGH (ref 83–108)
pO2, Arterial: 390 mmHg — ABNORMAL HIGH (ref 83–108)
pO2, Arterial: 220 mmHg — ABNORMAL HIGH (ref 83–108)
pO2, Arterial: 252 mmHg — ABNORMAL HIGH (ref 83–108)
pO2, Arterial: 184 mmHg — ABNORMAL HIGH (ref 83–108)

## 2022-11-10 LAB — POCT I-STAT, CHEM 8
BUN: 20 mg/dL (ref 8–23)
BUN: 19 mg/dL (ref 8–23)
BUN: 19 mg/dL (ref 8–23)
BUN: 18 mg/dL (ref 8–23)
BUN: 20 mg/dL (ref 8–23)
Calcium, Ion: 1.24 mmol/L (ref 1.15–1.40)
Calcium, Ion: 1.23 mmol/L (ref 1.15–1.40)
Calcium, Ion: 1.05 mmol/L — ABNORMAL LOW (ref 1.15–1.40)
Calcium, Ion: 1.01 mmol/L — ABNORMAL LOW (ref 1.15–1.40)
Calcium, Ion: 0.99 mmol/L — ABNORMAL LOW (ref 1.15–1.40)
Chloride: 107 mmol/L (ref 98–111)
Chloride: 105 mmol/L (ref 98–111)
Chloride: 106 mmol/L (ref 98–111)
Chloride: 105 mmol/L (ref 98–111)
Chloride: 103 mmol/L (ref 98–111)
Creatinine, Ser: 1.4 mg/dL — ABNORMAL HIGH (ref 0.61–1.24)
Creatinine, Ser: 1.3 mg/dL — ABNORMAL HIGH (ref 0.61–1.24)
Creatinine, Ser: 1.2 mg/dL (ref 0.61–1.24)
Creatinine, Ser: 1.2 mg/dL (ref 0.61–1.24)
Creatinine, Ser: 1.2 mg/dL (ref 0.61–1.24)
Glucose, Bld: 98 mg/dL (ref 70–99)
Glucose, Bld: 101 mg/dL — ABNORMAL HIGH (ref 70–99)
Glucose, Bld: 106 mg/dL — ABNORMAL HIGH (ref 70–99)
Glucose, Bld: 101 mg/dL — ABNORMAL HIGH (ref 70–99)
Glucose, Bld: 169 mg/dL — ABNORMAL HIGH (ref 70–99)
HCT: 31 % — ABNORMAL LOW (ref 39.0–52.0)
HCT: 28 % — ABNORMAL LOW (ref 39.0–52.0)
HCT: 23 % — ABNORMAL LOW (ref 39.0–52.0)
HCT: 20 % — ABNORMAL LOW (ref 39.0–52.0)
HCT: 20 % — ABNORMAL LOW (ref 39.0–52.0)
Hemoglobin: 10.5 g/dL — ABNORMAL LOW (ref 13.0–17.0)
Hemoglobin: 9.5 g/dL — ABNORMAL LOW (ref 13.0–17.0)
Hemoglobin: 7.8 g/dL — ABNORMAL LOW (ref 13.0–17.0)
Hemoglobin: 6.8 g/dL — CL (ref 13.0–17.0)
Hemoglobin: 6.8 g/dL — CL (ref 13.0–17.0)
Potassium: 4.3 mmol/L (ref 3.5–5.1)
Potassium: 4.3 mmol/L (ref 3.5–5.1)
Potassium: 5.6 mmol/L — ABNORMAL HIGH (ref 3.5–5.1)
Potassium: 5.9 mmol/L — ABNORMAL HIGH (ref 3.5–5.1)
Potassium: 5.6 mmol/L — ABNORMAL HIGH (ref 3.5–5.1)
Sodium: 141 mmol/L (ref 135–145)
Sodium: 141 mmol/L (ref 135–145)
Sodium: 139 mmol/L (ref 135–145)
Sodium: 138 mmol/L (ref 135–145)
Sodium: 137 mmol/L (ref 135–145)
TCO2: 26 mmol/L (ref 22–32)
TCO2: 25 mmol/L (ref 22–32)
TCO2: 26 mmol/L (ref 22–32)
TCO2: 27 mmol/L (ref 22–32)
TCO2: 24 mmol/L (ref 22–32)

## 2022-11-10 LAB — BASIC METABOLIC PANEL
Anion gap: 7 (ref 5–15)
Anion gap: 7 (ref 5–15)
BUN: 15 mg/dL (ref 8–23)
BUN: 17 mg/dL (ref 8–23)
CO2: 20 mmol/L — ABNORMAL LOW (ref 22–32)
CO2: 20 mmol/L — ABNORMAL LOW (ref 22–32)
Calcium: 7.6 mg/dL — ABNORMAL LOW (ref 8.9–10.3)
Calcium: 7.5 mg/dL — ABNORMAL LOW (ref 8.9–10.3)
Chloride: 110 mmol/L (ref 98–111)
Chloride: 111 mmol/L (ref 98–111)
Creatinine, Ser: 1.45 mg/dL — ABNORMAL HIGH (ref 0.61–1.24)
Creatinine, Ser: 1.48 mg/dL — ABNORMAL HIGH (ref 0.61–1.24)
GFR, Estimated: 50 mL/min — ABNORMAL LOW (ref >60)
GFR, Estimated: 48 mL/min — ABNORMAL LOW (ref >60)
Glucose, Bld: 118 mg/dL — ABNORMAL HIGH (ref 70–99)
Glucose, Bld: 110 mg/dL — ABNORMAL HIGH (ref 70–99)
Potassium: 4.1 mmol/L (ref 3.5–5.1)
Potassium: 4.4 mmol/L (ref 3.5–5.1)
Sodium: 137 mmol/L (ref 135–145)
Sodium: 138 mmol/L (ref 135–145)

## 2022-11-10 LAB — GLUCOSE, CAPILLARY
Glucose-Capillary: 100 mg/dL — ABNORMAL HIGH (ref 70–99)
Glucose-Capillary: 79 mg/dL (ref 70–99)
Glucose-Capillary: 160 mg/dL — ABNORMAL HIGH (ref 70–99)
Glucose-Capillary: 66 mg/dL — ABNORMAL LOW (ref 70–99)
Glucose-Capillary: 122 mg/dL — ABNORMAL HIGH (ref 70–99)
Glucose-Capillary: 114 mg/dL — ABNORMAL HIGH (ref 70–99)
Glucose-Capillary: 77 mg/dL (ref 70–99)
Glucose-Capillary: 98 mg/dL (ref 70–99)
Glucose-Capillary: 113 mg/dL — ABNORMAL HIGH (ref 70–99)
Glucose-Capillary: 108 mg/dL — ABNORMAL HIGH (ref 70–99)
Glucose-Capillary: 125 mg/dL — ABNORMAL HIGH (ref 70–99)
Glucose-Capillary: 147 mg/dL — ABNORMAL HIGH (ref 70–99)
Glucose-Capillary: 124 mg/dL — ABNORMAL HIGH (ref 70–99)

## 2022-11-10 LAB — PROTIME-INR
INR: 2.1 — ABNORMAL HIGH (ref 0.8–1.2)
Prothrombin Time: 23.8 seconds — ABNORMAL HIGH (ref 11.4–15.2)

## 2022-11-10 LAB — CBC
HCT: 21.7 % — ABNORMAL LOW (ref 39.0–52.0)
HCT: 22.5 % — ABNORMAL LOW (ref 39.0–52.0)
HCT: 21.4 % — ABNORMAL LOW (ref 39.0–52.0)
Hemoglobin: 7.2 g/dL — ABNORMAL LOW (ref 13.0–17.0)
Hemoglobin: 7.6 g/dL — ABNORMAL LOW (ref 13.0–17.0)
Hemoglobin: 7.2 g/dL — ABNORMAL LOW (ref 13.0–17.0)
MCH: 31 pg (ref 26.0–34.0)
MCH: 30.8 pg (ref 26.0–34.0)
MCH: 30.9 pg (ref 26.0–34.0)
MCHC: 33.2 g/dL (ref 30.0–36.0)
MCHC: 33.8 g/dL (ref 30.0–36.0)
MCHC: 33.6 g/dL (ref 30.0–36.0)
MCV: 93.5 fL (ref 80.0–100.0)
MCV: 91.1 fL (ref 80.0–100.0)
MCV: 91.8 fL (ref 80.0–100.0)
Platelets: 66 10*3/uL — ABNORMAL LOW (ref 150–400)
Platelets: 93 10*3/uL — ABNORMAL LOW (ref 150–400)
Platelets: 91 10*3/uL — ABNORMAL LOW (ref 150–400)
RBC: 2.32 MIL/uL — ABNORMAL LOW (ref 4.22–5.81)
RBC: 2.47 MIL/uL — ABNORMAL LOW (ref 4.22–5.81)
RBC: 2.33 MIL/uL — ABNORMAL LOW (ref 4.22–5.81)
RDW: 13.8 % (ref 11.5–15.5)
RDW: 14.2 % (ref 11.5–15.5)
RDW: 14.4 % (ref 11.5–15.5)
WBC: 7.9 10*3/uL (ref 4.0–10.5)
WBC: 9 10*3/uL (ref 4.0–10.5)
WBC: 8.8 10*3/uL (ref 4.0–10.5)
nRBC: 0 % (ref 0.0–0.2)
nRBC: 0 % (ref 0.0–0.2)
nRBC: 0 % (ref 0.0–0.2)

## 2022-11-10 LAB — POCT I-STAT EG7
Acid-base deficit: 3 mmol/L — ABNORMAL HIGH (ref 0.0–2.0)
Bicarbonate: 22.4 mmol/L (ref 20.0–28.0)
Calcium, Ion: 1.03 mmol/L — ABNORMAL LOW (ref 1.15–1.40)
HCT: 22 % — ABNORMAL LOW (ref 39.0–52.0)
Hemoglobin: 7.5 g/dL — ABNORMAL LOW (ref 13.0–17.0)
O2 Saturation: 82 %
Potassium: 4.4 mmol/L (ref 3.5–5.1)
Sodium: 142 mmol/L (ref 135–145)
TCO2: 24 mmol/L (ref 22–32)
pCO2, Ven: 40.9 mmHg — ABNORMAL LOW (ref 44–60)
pH, Ven: 7.347 (ref 7.25–7.43)
pO2, Ven: 48 mmHg — ABNORMAL HIGH (ref 32–45)

## 2022-11-10 LAB — MAGNESIUM
Magnesium: 3.1 mg/dL — ABNORMAL HIGH (ref 1.7–2.4)
Magnesium: 2.9 mg/dL — ABNORMAL HIGH (ref 1.7–2.4)

## 2022-11-10 LAB — PREPARE RBC (CROSSMATCH)

## 2022-11-10 LAB — TYPE AND SCREEN: Unit division: 0

## 2022-11-10 LAB — ABO/RH: ABO/RH(D): B NEG

## 2022-11-10 LAB — APTT: aPTT: 46 seconds — ABNORMAL HIGH (ref 24–36)

## 2022-11-10 LAB — BPAM RBC
Blood Product Expiration Date: 202407312359
ISSUE DATE / TIME: 202406271114

## 2022-11-10 LAB — HEMOGLOBIN AND HEMATOCRIT, BLOOD
HCT: 20.6 % — ABNORMAL LOW (ref 39.0–52.0)
Hemoglobin: 6.9 g/dL — CL (ref 13.0–17.0)

## 2022-11-10 LAB — PLATELET COUNT: Platelets: 92 10*3/uL — ABNORMAL LOW (ref 150–400)

## 2022-11-10 SURGERY — CORONARY ARTERY BYPASS GRAFTING (CABG)
Anesthesia: General | Site: Chest

## 2022-11-10 MED ORDER — LIDOCAINE 2% (20 MG/ML) 5 ML SYRINGE
INTRAMUSCULAR | Status: AC
  Filled 2022-11-10: qty 5

## 2022-11-10 MED ORDER — SODIUM CHLORIDE 0.9 % IV SOLN: 250.0000 mL | INTRAVENOUS | Status: DC

## 2022-11-10 MED ORDER — PROPOFOL 10 MG/ML IV BOLUS
INTRAVENOUS | Status: AC
  Filled 2022-11-10: qty 20

## 2022-11-10 MED ORDER — BISACODYL 5 MG PO TBEC
10.0000 mg | DELAYED_RELEASE_TABLET | Freq: Every day | ORAL | Status: DC
  Administered 2022-11-12 – 2022-11-20 (×6): 10 mg via ORAL
  Filled 2022-11-10 (×10): qty 2

## 2022-11-10 MED ORDER — SODIUM CHLORIDE 0.9% FLUSH
3.0000 mL | Freq: Two times a day (BID) | INTRAVENOUS | Status: DC
  Administered 2022-11-11: 3 mL via INTRAVENOUS

## 2022-11-10 MED ORDER — ACETAMINOPHEN 160 MG/5ML PO SOLN
1000.0000 mg | Freq: Four times a day (QID) | ORAL | Status: AC
  Administered 2022-11-10 – 2022-11-11 (×2): 1000 mg
  Filled 2022-11-10 (×2): qty 40.6

## 2022-11-10 MED ORDER — FENTANYL CITRATE (PF) 250 MCG/5ML IJ SOLN
INTRAMUSCULAR | Status: AC
  Filled 2022-11-10: qty 5

## 2022-11-10 MED ORDER — LACTATED RINGERS IV SOLN: INTRAVENOUS | Status: DC

## 2022-11-10 MED ORDER — FLUTICASONE PROPIONATE 50 MCG/ACT NA SUSP
2.0000 | Freq: Every day | NASAL | Status: DC
  Administered 2022-11-16 – 2022-11-21 (×6): 2 via NASAL
  Filled 2022-11-10 (×2): qty 16

## 2022-11-10 MED ORDER — ARTIFICIAL TEARS OPHTHALMIC OINT
TOPICAL_OINTMENT | OPHTHALMIC | Status: AC
  Filled 2022-11-10: qty 3.5

## 2022-11-10 MED ORDER — SODIUM CHLORIDE 0.45 % IV SOLN: INTRAVENOUS | Status: DC | PRN

## 2022-11-10 MED ORDER — CHLORHEXIDINE GLUCONATE 4 % EX SOLN: 30.0000 mL | CUTANEOUS | Status: DC

## 2022-11-10 MED ORDER — CEFAZOLIN SODIUM-DEXTROSE 2-4 GM/100ML-% IV SOLN
2.0000 g | Freq: Three times a day (TID) | INTRAVENOUS | Status: AC
  Administered 2022-11-10 – 2022-11-12 (×6): 2 g via INTRAVENOUS
  Filled 2022-11-10 (×6): qty 100

## 2022-11-10 MED ORDER — METOPROLOL TARTRATE 25 MG/10 ML ORAL SUSPENSION: 12.5000 mg | Freq: Two times a day (BID) | ORAL | Status: DC

## 2022-11-10 MED ORDER — MAGNESIUM SULFATE 4 GM/100ML IV SOLN
4.0000 g | Freq: Once | INTRAVENOUS | Status: AC
  Administered 2022-11-10: 4 g via INTRAVENOUS
  Filled 2022-11-10: qty 100

## 2022-11-10 MED ORDER — METOPROLOL TARTRATE 12.5 MG HALF TABLET
12.5000 mg | ORAL_TABLET | Freq: Once | ORAL | Status: DC
  Filled 2022-11-10: qty 1

## 2022-11-10 MED ORDER — ONDANSETRON HCL 4 MG/2ML IJ SOLN: 4.0000 mg | Freq: Four times a day (QID) | INTRAMUSCULAR | Status: DC | PRN

## 2022-11-10 MED ORDER — OXYCODONE HCL 5 MG PO TABS
5.0000 mg | ORAL_TABLET | ORAL | Status: DC | PRN
  Administered 2022-11-12: 5 mg via ORAL
  Administered 2022-11-12: 10 mg via ORAL
  Administered 2022-11-13: 5 mg via ORAL
  Administered 2022-11-13: 10 mg via ORAL
  Administered 2022-11-14 – 2022-11-19 (×5): 5 mg via ORAL
  Filled 2022-11-10 (×2): qty 1
  Filled 2022-11-10: qty 2
  Filled 2022-11-10 (×5): qty 1
  Filled 2022-11-10: qty 2

## 2022-11-10 MED ORDER — CALCIUM GLUCONATE-NACL 2-0.675 GM/100ML-% IV SOLN
2.0000 g | Freq: Once | INTRAVENOUS | Status: AC
  Administered 2022-11-10: 2000 mg via INTRAVENOUS
  Filled 2022-11-10 (×2): qty 100

## 2022-11-10 MED ORDER — HEPARIN SODIUM (PORCINE) 1000 UNIT/ML IJ SOLN
INTRAMUSCULAR | Status: AC
  Filled 2022-11-10: qty 1

## 2022-11-10 MED ORDER — MORPHINE SULFATE (PF) 2 MG/ML IV SOLN
1.0000 mg | INTRAVENOUS | Status: DC | PRN
  Administered 2022-11-10 – 2022-11-12 (×10): 2 mg via INTRAVENOUS
  Administered 2022-11-12: 1 mg via INTRAVENOUS
  Administered 2022-11-12: 2 mg via INTRAVENOUS
  Filled 2022-11-10 (×12): qty 1

## 2022-11-10 MED ORDER — PAROXETINE HCL 20 MG PO TABS
40.0000 mg | ORAL_TABLET | ORAL | Status: DC
  Administered 2022-11-12 – 2022-11-21 (×10): 40 mg via ORAL
  Filled 2022-11-10 (×11): qty 2

## 2022-11-10 MED ORDER — PANTOPRAZOLE SODIUM 40 MG PO TBEC
40.0000 mg | DELAYED_RELEASE_TABLET | Freq: Every day | ORAL | Status: DC
  Administered 2022-11-12 – 2022-11-21 (×10): 40 mg via ORAL
  Filled 2022-11-10 (×10): qty 1

## 2022-11-10 MED ORDER — LACTATED RINGERS IV SOLN: INTRAVENOUS | Status: DC | PRN

## 2022-11-10 MED ORDER — MIDAZOLAM HCL (PF) 5 MG/ML IJ SOLN
INTRAMUSCULAR | Status: DC | PRN
  Administered 2022-11-10 (×3): 1 mg via INTRAVENOUS
  Administered 2022-11-10: 2 mg via INTRAVENOUS
  Administered 2022-11-10: 1 mg via INTRAVENOUS

## 2022-11-10 MED ORDER — CHLORHEXIDINE GLUCONATE CLOTH 2 % EX PADS
6.0000 | MEDICATED_PAD | Freq: Every day | CUTANEOUS | Status: DC
  Administered 2022-11-10 – 2022-11-14 (×5): 6 via TOPICAL

## 2022-11-10 MED ORDER — ASPIRIN 81 MG PO CHEW
324.0000 mg | CHEWABLE_TABLET | Freq: Every day | ORAL | Status: DC
  Filled 2022-11-10: qty 4

## 2022-11-10 MED ORDER — SODIUM CHLORIDE (PF) 0.9 % IJ SOLN
INTRAMUSCULAR | Status: AC
  Filled 2022-11-10: qty 10

## 2022-11-10 MED ORDER — ATORVASTATIN CALCIUM 80 MG PO TABS
80.0000 mg | ORAL_TABLET | Freq: Every evening | ORAL | Status: DC
  Administered 2022-11-12 – 2022-11-20 (×9): 80 mg via ORAL
  Filled 2022-11-10 (×9): qty 1

## 2022-11-10 MED ORDER — ONDANSETRON HCL 4 MG/2ML IJ SOLN
INTRAMUSCULAR | Status: DC | PRN
  Administered 2022-11-10: 4 mg via INTRAVENOUS

## 2022-11-10 MED ORDER — SODIUM CHLORIDE 0.9 % IV SOLN: INTRAVENOUS | Status: DC

## 2022-11-10 MED ORDER — SODIUM CHLORIDE 0.9% IV SOLUTION: Freq: Once | INTRAVENOUS | Status: AC

## 2022-11-10 MED ORDER — INSULIN REGULAR(HUMAN) IN NACL 100-0.9 UT/100ML-% IV SOLN: INTRAVENOUS | Status: DC

## 2022-11-10 MED ORDER — PANTOPRAZOLE SODIUM 40 MG IV SOLR
40.0000 mg | Freq: Every day | INTRAVENOUS | Status: AC
  Administered 2022-11-10 – 2022-11-11 (×2): 40 mg via INTRAVENOUS
  Filled 2022-11-10 (×2): qty 10

## 2022-11-10 MED ORDER — ONDANSETRON HCL 4 MG/2ML IJ SOLN
INTRAMUSCULAR | Status: AC
  Filled 2022-11-10: qty 2

## 2022-11-10 MED ORDER — PAPAVERINE HCL 30 MG/ML IJ SOLN
INTRAMUSCULAR | Status: AC
  Filled 2022-11-10: qty 2

## 2022-11-10 MED ORDER — METOPROLOL TARTRATE 5 MG/5ML IV SOLN
2.5000 mg | INTRAVENOUS | Status: DC | PRN
  Administered 2022-11-10 – 2022-11-12 (×2): 2.5 mg via INTRAVENOUS
  Administered 2022-11-14: 5 mg via INTRAVENOUS
  Administered 2022-11-14: 2.5 mg via INTRAVENOUS
  Filled 2022-11-10 (×4): qty 5

## 2022-11-10 MED ORDER — PHENYLEPHRINE HCL-NACL 20-0.9 MG/250ML-% IV SOLN
0.0000 ug/min | INTRAVENOUS | Status: DC
  Administered 2022-11-10: 55 ug/min via INTRAVENOUS
  Administered 2022-11-10: 30 ug/min via INTRAVENOUS
  Administered 2022-11-11: 110 ug/min via INTRAVENOUS
  Administered 2022-11-11: 175 ug/min via INTRAVENOUS
  Administered 2022-11-11: 80 ug/min via INTRAVENOUS
  Filled 2022-11-10 (×3): qty 250

## 2022-11-10 MED ORDER — ALBUMIN HUMAN 5 % IV SOLN
250.0000 mL | INTRAVENOUS | Status: AC | PRN
  Administered 2022-11-10 (×5): 12.5 g via INTRAVENOUS
  Filled 2022-11-10 (×3): qty 250

## 2022-11-10 MED ORDER — SODIUM CHLORIDE 0.9 % IV SOLN: INTRAVENOUS | Status: DC | PRN

## 2022-11-10 MED ORDER — MIDAZOLAM HCL 2 MG/2ML IJ SOLN: 2.0000 mg | INTRAMUSCULAR | Status: DC | PRN

## 2022-11-10 MED ORDER — ROCURONIUM BROMIDE 10 MG/ML (PF) SYRINGE
PREFILLED_SYRINGE | INTRAVENOUS | Status: DC | PRN
  Administered 2022-11-10: 20 mg via INTRAVENOUS
  Administered 2022-11-10: 30 mg via INTRAVENOUS
  Administered 2022-11-10: 20 mg via INTRAVENOUS
  Administered 2022-11-10: 30 mg via INTRAVENOUS
  Administered 2022-11-10: 80 mg via INTRAVENOUS
  Administered 2022-11-10: 50 mg via INTRAVENOUS
  Administered 2022-11-10: 30 mg via INTRAVENOUS

## 2022-11-10 MED ORDER — ROCURONIUM BROMIDE 10 MG/ML (PF) SYRINGE
PREFILLED_SYRINGE | INTRAVENOUS | Status: AC
  Filled 2022-11-10: qty 10

## 2022-11-10 MED ORDER — LIDOCAINE 2% (20 MG/ML) 5 ML SYRINGE
INTRAMUSCULAR | Status: DC | PRN
  Administered 2022-11-10: 80 mg via INTRAVENOUS

## 2022-11-10 MED ORDER — DEXTROSE 50 % IV SOLN
0.0000 mL | INTRAVENOUS | Status: DC | PRN
  Administered 2022-11-10: 50 mL via INTRAVENOUS
  Filled 2022-11-10: qty 50

## 2022-11-10 MED ORDER — MIDAZOLAM HCL (PF) 10 MG/2ML IJ SOLN
INTRAMUSCULAR | Status: AC
  Filled 2022-11-10: qty 2

## 2022-11-10 MED ORDER — FENOFIBRATE 160 MG PO TABS
160.0000 mg | ORAL_TABLET | Freq: Every day | ORAL | Status: DC
  Administered 2022-11-12 – 2022-11-21 (×10): 160 mg via ORAL
  Filled 2022-11-10 (×10): qty 1

## 2022-11-10 MED ORDER — ~~LOC~~ CARDIAC SURGERY, PATIENT & FAMILY EDUCATION
Freq: Once | Status: DC
  Filled 2022-11-10: qty 1

## 2022-11-10 MED ORDER — SODIUM CHLORIDE 0.9% FLUSH: 10.0000 mL | INTRAVENOUS | Status: DC | PRN

## 2022-11-10 MED ORDER — POTASSIUM CHLORIDE 10 MEQ/50ML IV SOLN: 10.0000 meq | INTRAVENOUS | Status: AC

## 2022-11-10 MED ORDER — ARTIFICIAL TEARS OPHTHALMIC OINT
TOPICAL_OINTMENT | OPHTHALMIC | Status: DC | PRN
  Administered 2022-11-10: 1 via OPHTHALMIC

## 2022-11-10 MED ORDER — ACETAMINOPHEN 500 MG PO TABS
1000.0000 mg | ORAL_TABLET | Freq: Four times a day (QID) | ORAL | Status: AC
  Administered 2022-11-12 – 2022-11-15 (×13): 1000 mg via ORAL
  Filled 2022-11-10 (×12): qty 2

## 2022-11-10 MED ORDER — SODIUM CHLORIDE 0.9% FLUSH: 3.0000 mL | INTRAVENOUS | Status: DC | PRN

## 2022-11-10 MED ORDER — VANCOMYCIN HCL 1000 MG IV SOLR: INTRAVENOUS | Status: DC | PRN

## 2022-11-10 MED ORDER — ORAL CARE MOUTH RINSE: 15.0000 mL | OROMUCOSAL | Status: DC | PRN

## 2022-11-10 MED ORDER — CHLORHEXIDINE GLUCONATE 0.12 % MT SOLN
15.0000 mL | OROMUCOSAL | Status: AC
  Administered 2022-11-10: 15 mL via OROMUCOSAL
  Filled 2022-11-10: qty 15

## 2022-11-10 MED ORDER — ASPIRIN 325 MG PO TBEC
325.0000 mg | DELAYED_RELEASE_TABLET | Freq: Every day | ORAL | Status: DC
  Administered 2022-11-12 – 2022-11-21 (×10): 325 mg via ORAL
  Filled 2022-11-10 (×10): qty 1

## 2022-11-10 MED ORDER — NITROGLYCERIN IN D5W 200-5 MCG/ML-% IV SOLN: 0.0000 ug/min | INTRAVENOUS | Status: DC

## 2022-11-10 MED ORDER — PAPAVERINE HCL 30 MG/ML IJ SOLN
INTRAMUSCULAR | Status: DC | PRN
  Administered 2022-11-10: 60 mg via INTRAVENOUS

## 2022-11-10 MED ORDER — PROPOFOL 10 MG/ML IV BOLUS
INTRAVENOUS | Status: DC | PRN
  Administered 2022-11-10: 20 mg via INTRAVENOUS
  Administered 2022-11-10: 50 mg via INTRAVENOUS
  Administered 2022-11-10: 10 mg via INTRAVENOUS

## 2022-11-10 MED ORDER — BISACODYL 10 MG RE SUPP
10.0000 mg | Freq: Every day | RECTAL | Status: DC
  Filled 2022-11-10: qty 1

## 2022-11-10 MED ORDER — ORAL CARE MOUTH RINSE
15.0000 mL | OROMUCOSAL | Status: DC
  Administered 2022-11-10 – 2022-11-11 (×11): 15 mL via OROMUCOSAL

## 2022-11-10 MED ORDER — VANCOMYCIN HCL IN DEXTROSE 1-5 GM/200ML-% IV SOLN
1000.0000 mg | Freq: Once | INTRAVENOUS | Status: AC
  Administered 2022-11-10: 1000 mg via INTRAVENOUS
  Filled 2022-11-10 (×2): qty 200

## 2022-11-10 MED ORDER — ALBUMIN HUMAN 5 % IV SOLN: INTRAVENOUS | Status: DC | PRN

## 2022-11-10 MED ORDER — PROTAMINE SULFATE 10 MG/ML IV SOLN
INTRAVENOUS | Status: AC
  Filled 2022-11-10: qty 5

## 2022-11-10 MED ORDER — CHLORHEXIDINE GLUCONATE 0.12 % MT SOLN
15.0000 mL | Freq: Once | OROMUCOSAL | Status: AC
  Administered 2022-11-10: 15 mL via OROMUCOSAL
  Filled 2022-11-10: qty 15

## 2022-11-10 MED ORDER — PROTAMINE SULFATE 10 MG/ML IV SOLN
INTRAVENOUS | Status: AC
  Filled 2022-11-10: qty 25

## 2022-11-10 MED ORDER — FENTANYL CITRATE (PF) 250 MCG/5ML IJ SOLN
INTRAMUSCULAR | Status: DC | PRN
  Administered 2022-11-10: 225 ug via INTRAVENOUS
  Administered 2022-11-10: 50 ug via INTRAVENOUS
  Administered 2022-11-10: 25 ug via INTRAVENOUS
  Administered 2022-11-10: 100 ug via INTRAVENOUS
  Administered 2022-11-10 (×6): 50 ug via INTRAVENOUS
  Administered 2022-11-10: 100 ug via INTRAVENOUS

## 2022-11-10 MED ORDER — HEPARIN SODIUM (PORCINE) 1000 UNIT/ML IJ SOLN
INTRAMUSCULAR | Status: DC | PRN
  Administered 2022-11-10: 29000 [IU] via INTRAVENOUS

## 2022-11-10 MED ORDER — DEXMEDETOMIDINE HCL IN NACL 400 MCG/100ML IV SOLN
0.0000 ug/kg/h | INTRAVENOUS | Status: DC
  Administered 2022-11-10: 0.7 ug/kg/h via INTRAVENOUS
  Filled 2022-11-10: qty 100

## 2022-11-10 MED ORDER — PROTAMINE SULFATE 10 MG/ML IV SOLN
INTRAVENOUS | Status: DC | PRN
  Administered 2022-11-10: 290 mg via INTRAVENOUS

## 2022-11-10 MED ORDER — DOCUSATE SODIUM 100 MG PO CAPS
200.0000 mg | ORAL_CAPSULE | Freq: Every day | ORAL | Status: DC
  Administered 2022-11-12 – 2022-11-20 (×6): 200 mg via ORAL
  Filled 2022-11-10 (×10): qty 2

## 2022-11-10 MED ORDER — PHENYLEPHRINE 80 MCG/ML (10ML) SYRINGE FOR IV PUSH (FOR BLOOD PRESSURE SUPPORT)
PREFILLED_SYRINGE | INTRAVENOUS | Status: DC | PRN
  Administered 2022-11-10: 160 ug via INTRAVENOUS
  Administered 2022-11-10: 80 ug via INTRAVENOUS
  Administered 2022-11-10 (×2): 40 ug via INTRAVENOUS
  Administered 2022-11-10 (×2): 80 ug via INTRAVENOUS
  Administered 2022-11-10: 40 ug via INTRAVENOUS
  Administered 2022-11-10: 20 ug via INTRAVENOUS
  Administered 2022-11-10 (×2): 40 ug via INTRAVENOUS

## 2022-11-10 MED ORDER — 0.9 % SODIUM CHLORIDE (POUR BTL) OPTIME
TOPICAL | Status: DC | PRN
  Administered 2022-11-10: 5000 mL

## 2022-11-10 MED ORDER — ACETAMINOPHEN 160 MG/5ML PO SOLN
650.0000 mg | Freq: Once | ORAL | Status: AC
  Administered 2022-11-10: 650 mg
  Filled 2022-11-10: qty 20.3

## 2022-11-10 MED ORDER — PHENYLEPHRINE 80 MCG/ML (10ML) SYRINGE FOR IV PUSH (FOR BLOOD PRESSURE SUPPORT): PREFILLED_SYRINGE | INTRAVENOUS | Status: DC | PRN

## 2022-11-10 MED ORDER — ZOLPIDEM TARTRATE 5 MG PO TABS
5.0000 mg | ORAL_TABLET | Freq: Every day | ORAL | Status: DC
  Administered 2022-11-12 – 2022-11-20 (×9): 5 mg via ORAL
  Filled 2022-11-10 (×9): qty 1

## 2022-11-10 MED ORDER — GABAPENTIN 300 MG PO CAPS
300.0000 mg | ORAL_CAPSULE | Freq: Every day | ORAL | Status: DC
  Administered 2022-11-12 – 2022-11-20 (×9): 300 mg via ORAL
  Filled 2022-11-10 (×9): qty 1

## 2022-11-10 MED ORDER — ASPIRIN 81 MG PO CHEW
324.0000 mg | CHEWABLE_TABLET | Freq: Once | ORAL | Status: AC
  Administered 2022-11-10: 324 mg via ORAL
  Filled 2022-11-10: qty 4

## 2022-11-10 MED ORDER — PHENYLEPHRINE 80 MCG/ML (10ML) SYRINGE FOR IV PUSH (FOR BLOOD PRESSURE SUPPORT)
PREFILLED_SYRINGE | INTRAVENOUS | Status: AC
  Filled 2022-11-10: qty 10

## 2022-11-10 MED ORDER — PLASMA-LYTE A IV SOLN: INTRAVENOUS | Status: DC | PRN

## 2022-11-10 MED ORDER — TRAMADOL HCL 50 MG PO TABS
50.0000 mg | ORAL_TABLET | ORAL | Status: DC | PRN
  Administered 2022-11-12 – 2022-11-18 (×5): 50 mg via ORAL
  Administered 2022-11-18 – 2022-11-21 (×6): 100 mg via ORAL
  Filled 2022-11-10 (×3): qty 2
  Filled 2022-11-10: qty 1
  Filled 2022-11-10: qty 2
  Filled 2022-11-10 (×2): qty 1
  Filled 2022-11-10: qty 2
  Filled 2022-11-10 (×2): qty 1
  Filled 2022-11-10: qty 2

## 2022-11-10 MED ORDER — SODIUM CHLORIDE 0.9% FLUSH
10.0000 mL | Freq: Two times a day (BID) | INTRAVENOUS | Status: DC
  Administered 2022-11-10 – 2022-11-11 (×3): 10 mL
  Administered 2022-11-11 – 2022-11-12 (×2): 40 mL
  Administered 2022-11-12: 10 mL

## 2022-11-10 MED ORDER — METOPROLOL TARTRATE 12.5 MG HALF TABLET
12.5000 mg | ORAL_TABLET | Freq: Two times a day (BID) | ORAL | Status: DC
  Administered 2022-11-12 – 2022-11-14 (×4): 12.5 mg via ORAL
  Filled 2022-11-10 (×4): qty 1

## 2022-11-10 MED ORDER — METOCLOPRAMIDE HCL 5 MG/ML IJ SOLN
10.0000 mg | Freq: Four times a day (QID) | INTRAMUSCULAR | Status: AC
  Administered 2022-11-10 – 2022-11-11 (×6): 10 mg via INTRAVENOUS
  Filled 2022-11-10 (×6): qty 2

## 2022-11-10 MED FILL — Potassium Chloride Inj 2 mEq/ML: INTRAVENOUS | Qty: 40 | Status: AC

## 2022-11-10 MED FILL — Heparin Sodium (Porcine) Inj 1000 Unit/ML: Qty: 1000 | Status: AC

## 2022-11-10 MED FILL — Magnesium Sulfate Inj 50%: INTRAMUSCULAR | Qty: 10 | Status: AC

## 2022-11-10 SURGICAL SUPPLY — 90 items
ADAPTER CARDIO PERF ANTE/RETRO (ADAPTER) ×2 IMPLANT
ADH SKN CLS APL DERMABOND .7 (GAUZE/BANDAGES/DRESSINGS) ×2
ADPR PRFSN 84XANTGRD RTRGD (ADAPTER) ×2
BAG DECANTER FOR FLEXI CONT (MISCELLANEOUS) ×2 IMPLANT
BLADE CLIPPER SURG (BLADE) ×2 IMPLANT
BLADE MICRO SHARP 3 15 DEG (BLADE) ×2 IMPLANT
BLADE STERNUM SYSTEM 6 (BLADE) ×2 IMPLANT
BLADE SURG 11 STRL SS (BLADE) IMPLANT
BNDG CMPR 5X4 KNIT ELC UNQ LF (GAUZE/BANDAGES/DRESSINGS) ×4
BNDG CMPR 5X62 HK CLSR LF (GAUZE/BANDAGES/DRESSINGS) ×4
BNDG CMPR 6"X 5 YARDS HK CLSR (GAUZE/BANDAGES/DRESSINGS) ×4
BNDG ELASTIC 4INX 5YD STR LF (GAUZE/BANDAGES/DRESSINGS) IMPLANT
BNDG ELASTIC 4X5.8 VLCR STR LF (GAUZE/BANDAGES/DRESSINGS) ×2 IMPLANT
BNDG ELASTIC 6INX 5YD STR LF (GAUZE/BANDAGES/DRESSINGS) IMPLANT
BNDG ELASTIC 6X5.8 VLCR STR LF (GAUZE/BANDAGES/DRESSINGS) ×2 IMPLANT
BNDG GAUZE DERMACEA FLUFF 4 (GAUZE/BANDAGES/DRESSINGS) ×2 IMPLANT
BNDG GZE DERMACEA 4 6PLY (GAUZE/BANDAGES/DRESSINGS) ×4
BOOT SUTURE VASCULAR YLW (MISCELLANEOUS) ×2
CANISTER SUCT 3000ML PPV (MISCELLANEOUS) ×2 IMPLANT
CANNULA MC2 2 STG 36/46 NON-V (CANNULA) IMPLANT
CANNULA NON VENT 20FR 12 (CANNULA) ×2 IMPLANT
CATH RETROPLEGIA CORONARY 14FR (CATHETERS) ×2 IMPLANT
CATH ROBINSON RED A/P 18FR (CATHETERS) ×6 IMPLANT
CATH THOR STR 32F SOFT 20 RADI (CATHETERS) ×4 IMPLANT
CATH THORACIC 28FR RT ANG (CATHETERS) ×2 IMPLANT
CLAMP SUTURE YELLOW 5 PAIRS (MISCELLANEOUS) ×2 IMPLANT
CLIP TI LARGE 6 (CLIP) ×2 IMPLANT
CLIP TI MEDIUM 24 (CLIP) IMPLANT
CLIP TI WIDE RED SMALL 24 (CLIP) IMPLANT
CNTNR URN SCR LID CUP LEK RST (MISCELLANEOUS) ×4 IMPLANT
CONT SPEC 4OZ STRL OR WHT (MISCELLANEOUS) ×4
DERMABOND ADVANCED .7 DNX12 (GAUZE/BANDAGES/DRESSINGS) IMPLANT
DRAPE CV SPLIT W-CLR ANES SCRN (DRAPES) ×2 IMPLANT
DRAPE INCISE IOBAN 66X45 STRL (DRAPES) ×2 IMPLANT
DRAPE PERI GROIN 82X75IN TIB (DRAPES) ×2 IMPLANT
DRSG AQUACEL AG ADV 3.5X10 (GAUZE/BANDAGES/DRESSINGS) ×2 IMPLANT
ELECT BLADE 4.0 EZ CLEAN MEGAD (MISCELLANEOUS) ×2
ELECT REM PT RETURN 9FT ADLT (ELECTROSURGICAL) ×4
ELECTRODE BLDE 4.0 EZ CLN MEGD (MISCELLANEOUS) ×2 IMPLANT
ELECTRODE REM PT RTRN 9FT ADLT (ELECTROSURGICAL) ×4 IMPLANT
FELT TEFLON 1X6 (MISCELLANEOUS) ×2 IMPLANT
GAUZE 4X4 16PLY ~~LOC~~+RFID DBL (SPONGE) IMPLANT
GAUZE SPONGE 4X4 12PLY STRL (GAUZE/BANDAGES/DRESSINGS) ×4 IMPLANT
GAUZE SPONGE 4X4 12PLY STRL LF (GAUZE/BANDAGES/DRESSINGS) IMPLANT
GLOVE BIOGEL PI IND STRL 6 (GLOVE) IMPLANT
GLOVE BIOGEL PI IND STRL 6.5 (GLOVE) IMPLANT
GLOVE BIOGEL PI IND STRL 7.0 (GLOVE) IMPLANT
GLOVE SS BIOGEL STRL SZ 6 (GLOVE) IMPLANT
GLOVE SURG SS PI 8.0 STRL IVOR (GLOVE) IMPLANT
GOWN STRL REUS W/ TWL LRG LVL3 (GOWN DISPOSABLE) ×12 IMPLANT
GOWN STRL REUS W/TWL LRG LVL3 (GOWN DISPOSABLE) ×12
INSERT FOGARTY 61MM (MISCELLANEOUS) IMPLANT
KIT BASIN OR (CUSTOM PROCEDURE TRAY) ×2 IMPLANT
KIT TURNOVER KIT B (KITS) ×2 IMPLANT
KIT VASOVIEW HEMOPRO 2 VH 4000 (KITS) ×2 IMPLANT
MARKER DISTAL GRAFT W/ HOLDER (MISCELLANEOUS) ×4 IMPLANT
NS IRRIG 1000ML POUR BTL (IV SOLUTION) ×10 IMPLANT
PACK E OPEN HEART (SUTURE) ×2 IMPLANT
PACK OPEN HEART (CUSTOM PROCEDURE TRAY) ×2 IMPLANT
PAD ARMBOARD 7.5X6 YLW CONV (MISCELLANEOUS) ×4 IMPLANT
PAD ELECT DEFIB RADIOL ZOLL (MISCELLANEOUS) ×2 IMPLANT
PENCIL BUTTON HOLSTER BLD 10FT (ELECTRODE) ×2 IMPLANT
POSITIONER HEAD DONUT 9IN (MISCELLANEOUS) ×2 IMPLANT
PUNCH AORTIC ROTATE 4.0MM (MISCELLANEOUS) ×2 IMPLANT
PUNCH AORTIC ROTATE 4.5MM 8IN (MISCELLANEOUS) IMPLANT
SET MPS 3-ND DEL (MISCELLANEOUS) IMPLANT
SPONGE T-LAP 18X18 ~~LOC~~+RFID (SPONGE) IMPLANT
SUPPORT HEART JANKE-BARRON (MISCELLANEOUS) ×2 IMPLANT
SUT MNCRL AB 4-0 PS2 18 (SUTURE) ×4 IMPLANT
SUT PROLENE 4 0 RB 1 (SUTURE) ×6
SUT PROLENE 4 0 SH DA (SUTURE) ×2 IMPLANT
SUT PROLENE 4-0 RB1 .5 CRCL 36 (SUTURE) ×2 IMPLANT
SUT PROLENE 7 0 BV1 MDA (SUTURE) ×2 IMPLANT
SUT SILK 2 0 SH CR/8 (SUTURE) IMPLANT
SUT STEEL SZ 6 DBL 3X14 BALL (SUTURE) ×4 IMPLANT
SUT VIC AB 0 CTX 36 (SUTURE) ×4
SUT VIC AB 0 CTX36XBRD ANTBCTR (SUTURE) ×4 IMPLANT
SUT VIC AB 2-0 CT1 27 (SUTURE) ×6
SUT VIC AB 2-0 CT1 TAPERPNT 27 (SUTURE) ×4 IMPLANT
SYR BULB IRRIG 60ML STRL (SYRINGE) IMPLANT
SYSTEM SAHARA CHEST DRAIN ATS (WOUND CARE) ×2 IMPLANT
TAG SUTURE CLAMP YLW 5PR (MISCELLANEOUS) ×2
TAPE CLOTH SURG 4X10 WHT LF (GAUZE/BANDAGES/DRESSINGS) IMPLANT
TAPE PAPER 2X10 WHT MICROPORE (GAUZE/BANDAGES/DRESSINGS) IMPLANT
TOWEL GREEN STERILE (TOWEL DISPOSABLE) ×2 IMPLANT
TOWEL GREEN STERILE FF (TOWEL DISPOSABLE) ×2 IMPLANT
TRAY FOLEY SLVR 16FR TEMP STAT (SET/KITS/TRAYS/PACK) ×2 IMPLANT
TUBING LAP HI FLOW INSUFFLATIO (TUBING) ×2 IMPLANT
UNDERPAD 30X36 HEAVY ABSORB (UNDERPADS AND DIAPERS) ×2 IMPLANT
WATER STERILE IRR 1000ML POUR (IV SOLUTION) ×4 IMPLANT

## 2022-11-10 NOTE — Transfer of Care (Signed)
Immediate Anesthesia Transfer of Care Note  Patient: Collin Bridges [facility]  Procedure(s) Performed: CORONARY ARTERY BYPASS GRAFTING (CABG) X5 USING LEFT INTERNAL MAMMARY ARTERY (LIMA) AND ENDOSCOPICALLY HARVESTED RIGHT AND LEFT GREATER SAPHENOUS VEIN (Chest) TRANSESOPHAGEAL ECHOCARDIOGRAM  Patient Location: ICU  Anesthesia Type:General  Level of Consciousness: Patient remains intubated per anesthesia plan  Airway & Oxygen Therapy: Patient placed on Ventilator (see vital sign flow sheet for setting)  Post-op Assessment: Report given to RN and Post -op Vital signs reviewed and stable  Post vital signs: Reviewed and stable  Last Vitals:  Vitals Value Taken Time  BP 123/57 (aline) 11/10/22 1303  Temp 35.5 C 11/10/22 1303  Pulse 73 11/10/22 1303  Resp 16 11/10/22 1303  SpO2 100 % 11/10/22 1303  Vitals shown include unvalidated device data.  Last Pain:  Vitals:   11/10/22 0613  TempSrc:   PainSc: 0-No pain         Complications: No notable events documented.

## 2022-11-10 NOTE — Discharge Summary (Signed)
[address]       [place] [address]             [phone]    Physician Discharge Summary  Patient ID: Collin Bridges MRN: [id number] DOB/AGE: 1945/12/12 77 y.o.  Admit date: 11/10/2022 Discharge date: 11/21/2022  Admission Diagnoses:  Patient Active Problem List   Diagnosis Date Noted   Coronary artery disease 11/10/2022   CAD (coronary artery disease) 10/31/2022   Aortic atherosclerosis (HCC) 10/05/2022   NSTEMI (non-ST elevated myocardial infarction) (HCC) 07/12/2022   Abnormal cardiac CT angiography 07/12/2022   Severe sepsis (HCC) 07/09/2022   Chest pain 07/09/2022   Elevated troponin 07/09/2022   Depression 07/09/2022   Allergic rhinitis 07/09/2022   Chronic diastolic CHF (congestive heart failure) (HCC) 07/09/2022   CAP (community acquired pneumonia) 07/08/2022   Left leg cellulitis 06/03/2022   Cat scratch of left lower leg 05/18/2022   Skin infection 05/18/2022   DM2 (diabetes mellitus, type 2) (HCC) 12/01/2021   RLS (restless legs syndrome) 12/01/2021   Vitamin D deficiency 06/03/2021   History of stroke 04/25/2021   CVA (cerebral vascular accident) (HCC) 04/07/2021   Brain mass 03/17/2021   Headache 03/17/2021   Cerebral atrophy (HCC) 03/17/2021   Stroke of right basal ganglia (HCC) 03/17/2021   Forehead pain 03/17/2021   Increased prostate specific antigen (PSA) velocity 12/14/2020   Stage 3a chronic kidney disease (CKD) (HCC) 12/14/2020   Hyperlipidemia LDL goal <70 08/09/2017   Encounter for well adult exam with abnormal findings 07/29/2016   Mild aortic stenosis 07/29/2016   Insomnia 07/29/2016   Erectile dysfunction 03/30/2016   Anxiety disorder 01/06/2016   Benign prostatic hyperplasia with urinary frequency 01/06/2016   Essential hypertension 02/02/2012   Hypoglycemia 16-Nov-1945    Discharge Diagnoses:  Patient Active Problem List   Diagnosis Date Noted   S/P CABG x 5 11/21/2022   Pressure injury of skin  11/21/2022   Coronary artery disease 11/10/2022   CAD (coronary artery disease) 10/31/2022   Aortic atherosclerosis (HCC) 10/05/2022   NSTEMI (non-ST elevated myocardial infarction) (HCC) 07/12/2022   Abnormal cardiac CT angiography 07/12/2022   Severe sepsis (HCC) 07/09/2022   Chest pain 07/09/2022   Elevated troponin 07/09/2022   Depression 07/09/2022   Allergic rhinitis 07/09/2022   Chronic diastolic CHF (congestive heart failure) (HCC) 07/09/2022   CAP (community acquired pneumonia) 07/08/2022   Left leg cellulitis 06/03/2022   Cat scratch of left lower leg 05/18/2022   Skin infection 05/18/2022   DM2 (diabetes mellitus, type 2) (HCC) 12/01/2021   RLS (restless legs syndrome) 12/01/2021   Vitamin D deficiency 06/03/2021   History of stroke 04/25/2021   CVA (cerebral vascular accident) (HCC) 04/07/2021   Brain mass 03/17/2021   Headache 03/17/2021   Cerebral atrophy (HCC) 03/17/2021   Stroke of right basal ganglia (HCC) 03/17/2021   Forehead pain 03/17/2021   Increased prostate specific antigen (PSA) velocity 12/14/2020   Stage 3a chronic kidney disease (CKD) (HCC) 12/14/2020   Hyperlipidemia LDL goal <70 08/09/2017   Encounter for well adult exam with abnormal findings 07/29/2016   Mild aortic stenosis 07/29/2016   Insomnia 07/29/2016   Erectile dysfunction 03/30/2016   Anxiety disorder 01/06/2016   Benign prostatic hyperplasia with urinary frequency 01/06/2016   Essential hypertension 02/02/2012   Hypoglycemia 08-Dec-1945     Discharged Condition: Stable  HPI: This is a 77yo male who was admitted in February with sepsis and Community Aquired Pneumonia. He was found  to have chest pain, elevated troponin, and ruled in for a NSTEMI. On echo , he had a normal LVEF with some regional wall motion issues. Cardiac cath showed severe 3 vessel coronary artery disease. Pt was also found to have possibly new CVA by MRI along with meningioma, which is unchanged. Patient was  optimized and sent home to recover prior to consideration for CABG. At the time of exam, patient denied chest pain but does have shortness of breath with activity. He occasionally looses his memory and feels its the lack of blood flow to brain. He has been seen by Cardiology since discharge and was felt still weak requiring more rehab prior to surgery. Pt also has diabetes mellitus and he describes inherited hypoglycemia. He has CKD (Stage III).  He was seen again by Dr. Leafy Ro in the office on 10/31/2022. Patient now has low level exertional angina to where he is taking NTG and is unable to do much walking secondary to fatigue also. Potential risks, benefits, and complications of the surgery were again discussed with the patient and he is agreeable to proceed with surgery. He was instructed to stop Plavix 7 days before the surgery. Pre operative carotid duplex showed no significant internal carotid artery stenosis bilaterally.  Hospital Course: The patient underwent a CABG x 5. He was transferred from the OR to [facility] ICU in stable condition.He was extubated post op day one. He was weaned off Vasopressin drip.  A line, chest tubes, and foley were removed early in his post operative course. He had expected post op blood loss anemia. He did require a post op transfusion. He also had thrombocytopenia and platelet count went down to 56,000. Platelet count gradually did increase thereafter. He was weaned off the Insulin drip. His pre op HGA1C was 5.8. He will be restarted on Metformin once tolerating a diet better and closer to discharge. He had a vagal episode when standing, he was atrial paced at 90. He was volume overload but had low filling pressures, he was give Lasix and albumin. His thrombocytopenia continued to worsen and his platelet count was 56,000 after transfusion. Enoxaparin and heparin were discontinued and HIT panel was checked. He went into atrial fibrillation with RVR and was started on an Amiodarone  drip. He converted to sinus rhythm, amiodarone drip was transitioned to PO Amiodarone. He had expected postoperative acute blood loss anemia and was transfused with 1 unit of PRBCs. He was severely deconditioned, PT and OT evaluations were ordered.  They recommended SNF placement.  He had an AKI on CKD with a creatinine that increased to 1.77 on 06/30. Creatinine did decrease with time.  The patient's hemoglobin level improved to Hgb to 10.2.  He was hypertensive and his Lopressor was increased and his home Hydralazine was resumed.  He was volume overloaded and treated with IV lasix with good response.  He was felt stable for transfer to the progressive care unit on 11/15/2022.  The patient was transitioned to oral Lasix.  He had TED hose placed to help with lower extremity edema.  His creatinine level remained stable at 1.37. His incisions were healing well without sign of infection. His ambulation was advancing but due to his severe deconditioning he would require SNF at discharge.  He developed complaints of dry eyes in setting of known cataract disease.  He was started on artifical tears to assist with this.   SNF was arranged and he was felt stable for discharge on 11/21/2022.   Consults: pulmonary/intensive  care  Significant Diagnostic Studies:  CLINICAL DATA:  [id number] S/P CABG (coronary artery bypass graft) [id number]   EXAM: PORTABLE CHEST - 1 VIEW   COMPARISON:  11/12/2022   FINDINGS: Swan-Ganz has been retracted leaving the right IJ sheath in place. Left chest tube is been removed, with no pneumothorax. Mediastinal drain has been removed. Relatively low lung volumes with some patchy airspace opacities in the bases, left greater than right, marginally increased.   CABG markers.  Heart size upper limits normal.   Blunting of lateral costophrenic angles suggesting residual effusions.   Sternotomy wires.   IMPRESSION: Low lung volumes with bibasilar atelectasis and small effusions.      Electronically Signed   By: Corlis Leak M.D.   On: 11/13/2022 09:58  Treatments: surgery:  CABG X 5 with the LIMA to the LAD and RSVG from the aorta to the Right coronary artery and from the aorta to the Diagonal, sequenced to the OM1, sequenced to the OM2 with endoscopic greater saphenous vein harvest on the right and left leg by Dr. Leafy Ro on 11/10/2022.    Discharge Exam: Blood pressure (!) 153/88, pulse 74, temperature 97.9 F (36.6 C), temperature source Tympanic, resp. rate 18, height 5\' 11"  (1.803 m), weight 79 kg, SpO2 96 %.  General appearance: alert, cooperative, and no distress Heart: regular rate and rhythm Lungs: clear to auscultation bilaterally Abdomen: soft, non-tender; bowel sounds normal; no masses,  no organomegaly Extremities: edema mild pitting Wound: clean and dry   Discharge Medications:  The patient has been discharged on:   1.Beta Blocker:  Yes [ x  ]                              No   [   ]                              If No, reason:  2.Ace Inhibitor/ARB: Yes [   ]                                     No  [  x  ]                                     If No, reason:History of CKD  3.Statin:   Yes [ x  ]                  No  [   ]                  If No, reason:  4.Ecasa:  Yes  [   x]                  No   [   ]                  If No, reason:  Patient had ACS upon admission:No  Plavix/P2Y12 inhibitor: Yes [   ]                                      No  [  x ]  Discharge Instructions     Amb Referral to Cardiac Rehabilitation   Complete by: As directed    Diagnosis: CABG   CABG X ___: 5   After initial evaluation and assessments completed: Virtual Based Care may be provided alone or in conjunction with Phase 2 Cardiac Rehab based on patient barriers.: Yes   Intensive Cardiac Rehabilitation (ICR) [facility] location only OR Traditional Cardiac Rehabilitation (TCR) *If criteria for ICR are not met will enroll in TCR [facility] only): Yes       Allergies as of 11/21/2022   No Known Allergies      Medication List     STOP taking these medications    clopidogrel 75 MG tablet Commonly known as: PLAVIX   diphenhydrAMINE 25 MG tablet Commonly known as: BENADRYL   isosorbide mononitrate 60 MG 24 hr tablet Commonly known as: IMDUR   THERAWORX MUSCLE CRAMPS EX       TAKE these medications    amiodarone 200 MG tablet Commonly known as: PACERONE Take 1 tablet (200 mg total) by mouth 2 (two) times daily. X 7 days, then decrease to 200 mg daily   aspirin EC 325 MG tablet Take 1 tablet (325 mg total) by mouth daily.   atorvastatin 80 MG tablet Commonly known as: LIPITOR Take 1 tablet (80 mg total) by mouth daily.   cholecalciferol 25 MCG (1000 UNIT) tablet Commonly known as: VITAMIN D3 Take 1 tablet (1,000 Units total) by mouth daily. Take 1,000 Units by mouth daily.   fenofibrate 160 MG tablet Take 1 tablet (160 mg total) by mouth daily.   fluticasone 50 MCG/ACT nasal spray Commonly known as: FLONASE Place 2 sprays into both nostrils daily.   gabapentin 300 MG capsule Commonly known as: NEURONTIN Take 1 capsule (300 mg total) by mouth at bedtime.   HAIR/SKIN/NAILS/BIOTIN PO Take 2 tablets by mouth daily.   hydrALAZINE 25 MG tablet Commonly known as: APRESOLINE Take 1 tablet (25 mg total) by mouth every 8 (eight) hours.   losartan 100 MG tablet Commonly known as: COZAAR Take 1 tablet (100 mg total) by mouth daily.   Magnesium 500 MG Caps Take 500 mg by mouth daily.   metFORMIN 500 MG 24 hr tablet Commonly known as: GLUCOPHAGE-XR Take 1 tablet (500 mg) by mouth daily with breakfast.   metoprolol tartrate 25 MG tablet Commonly known as: LOPRESSOR Take 1 tablet (25 mg total) by mouth 2 (two) times daily. What changed: how much to take   nitroGLYCERIN 0.4 MG SL tablet Commonly known as: Nitrostat Place 1 tablet under the tongue every 5 minutes as needed for chest pain.   PARoxetine 40 MG  tablet Commonly known as: PAXIL Take 1 tablet (40 mg total) by mouth every morning.   Repatha SureClick 140 MG/ML Soaj Generic drug: Evolocumab Inject 140 mg into the skin every 14 days.   THERATEARS OP Place 1 drop into both eyes daily as needed (dry eyes).   traMADol 50 MG tablet Commonly known as: ULTRAM Take 1 tablet (50 mg total) by mouth every 6 (six) hours as needed for moderate pain.   vitamin E 180 MG (400 UNITS) capsule Take 400 Units by mouth daily.   zolpidem 10 MG tablet Commonly known as: AMBIEN TAKE 1 TABLET(10 MG) BY MOUTH AT BEDTIME AS NEEDED FOR SLEEP What changed:  how much to take how to take this when to take this additional instructions  Durable Medical Equipment  (From admission, onward)           Start     Ordered   11/17/22 1132  For home use only DME 3 n 1  Once        11/17/22 1131   11/14/22 0732  For home use only DME Walker rolling  Once       Question Answer Comment  Walker: With 5 Inch Wheels   Patient needs a walker to treat with the following condition Physical deconditioning      11/14/22 0731            Contact information for follow-up providers     [facility] [place]. Go on 12/06/2022.   Specialty: Cardiothoracic Surgery Why: Appointment is at 3:30 Contact information: [address] [place], [address] [place] [address] [phone]        [facility]. Go on 12/06/2022.   Why: Please arrive by 2:30 in order to have PA/LAT CXR taken PRIOR to office appointment with [facility] PA Contact information: [address] [place] [address]        Ronney Asters, NP. Go on 12/01/2022.   Specialty: Cardiology Why: Appointment time is at 10:05 am Contact information: [address] [place] [address] [phone]              Contact information for after-discharge care     Destination      [facility] .   Service: Skilled Nursing Contact information: [address] [place] [address] [phone]                     Signed:  Lowella Dandy, PA-C 11/21/2022, 10:33 AM

## 2022-11-10 NOTE — Consult Note (Signed)
NAME:  Collin Bridges, MRN:  [id number], DOB:  1945/12/14, LOS: 0 ADMISSION DATE:  11/10/2022, CONSULTATION DATE: 11/10/2022 REFERRING MD: Dr. Leafy Ro, CHIEF COMPLAINT: S/p CABG x 5  History of Present Illness:  77 year old male with multivessel coronary artery disease s/p NSTEMI last year, diabetes, hypertension, hyperlipidemia, hepatitis C and chronic HFrEF who underwent elective CABG x 5, in OR patient was noted to be wheezing as he was on Plavix, required 1 unit of PRBC.  220 of Cell Saver.  He remained intubated was transferred to ICU.  PCCM was consulted for help evaluation medical management  Pertinent  Medical History   Past Medical History:  Diagnosis Date   Allergy    seasonal   Anginal pain (HCC)    Anxiety    CHF (congestive heart failure) (HCC)    Coronary artery disease    Depression    Diabetes mellitus without complication (HCC)    Type 2   GERD (gastroesophageal reflux disease)    Pt states "take ambien for this"   Hepatitis C    HLD (hyperlipidemia) 08/09/2017   Hypertension    Hypoglycemia    Pneumonia 06/2022   Stroke (HCC) 03/2021   TIA     Significant Hospital Events: Including procedures, antibiotic start and stop dates in addition to other pertinent events     Interim History / Subjective:  As above  Objective   Blood pressure 136/81, pulse 68, temperature 98.4 F (36.9 C), temperature source Oral, resp. rate (!) 0, height 5\' 11"  (1.803 m), weight 82.8 kg, SpO2 96 %. PAP: (19-50)/(0-34) 19/2      Intake/Output Summary (Last 24 hours) at 11/10/2022 1306 Last data filed at 11/10/2022 1245 Gross per 24 hour  Intake 4251 ml  Output 1705 ml  Net 2546 ml   Filed Weights   11/10/22 0548  Weight: 82.8 kg    Examination: Physical exam: General: Crtitically ill-appearing elderly male, orally intubated HEENT: [place]/AT, eyes anicteric.  ETT and OGT in place Neuro: Sedated, not following commands.  Eyes are closed.  Pupils 3 mm bilateral  reactive to light Chest: Central sternotomy wound looks clean and dry coarse breath sounds, no wheezes or rhonchi.  Chest tube in place Heart: Regular rate and rhythm, no murmurs or gallops Abdomen: Soft, nondistended, bowel sounds present Skin: No rash  Labs and images were reviewed  Resolved Hospital Problem list     Assessment & Plan:  Coronary artery disease s/p CABG x 5 Continue aspirin and statin Chest tube management TCTS Continue to titrate Precedex with RASS goal 0/-1 Continue pain control with tramadol, oxycodone and morphine Closely monitor chest tube output  Acute respiratory insufficiency, postop Continue on protective ventilation VAP prevention bundle in place Rapid weaning protocol ordered is in place  Chronic HFpEF Monitor intake and output EF 60 to 65% with diastolic dysfunction GDMT once able to tolerate  Hypertension Holding antihypertensive for now, as patient is requiring low-dose phenylephrine  Hyperlipidemia Continue atorvastatin  Diabetes type 2 Patient hemoglobin A1c is 5.8 Currently on insulin infusion, monitor fingerstick with goal 140-180  Expected perioperative blood loss anemia Thrombocytopenia due to CPB S/p 1 unit of PRBC Patient was on Plavix, slightly oozing, closely monitor chest tube output and platelet count  Best Practice (right click and "Reselect all SmartList Selections" daily)   Diet/type: NPO DVT prophylaxis: SCD GI prophylaxis: PPI Lines: Central line, Arterial Line, and yes and it is still needed Foley:  Yes, and it is still needed Code Status:  full code Last date of multidisciplinary goals of care discussion [Per primary team]  Labs   CBC: Recent Labs  Lab 11/09/22 1101 11/10/22 0802 11/10/22 1102 11/10/22 1113 11/10/22 1144 11/10/22 1147 11/10/22 1209  WBC 6.7  --   --   --   --   --   --   HGB 12.1*   < > 6.8* 6.9* 6.5* 6.8* 6.5*  HCT 36.6*   < > 20.0* 20.6* 19.0* 20.0* 19.0*  MCV 94.8  --   --   --    --   --   --   PLT 134*  --   --  92*  --   --   --    < > = values in this interval not displayed.    Basic Metabolic Panel: Recent Labs  Lab 11/09/22 1101 11/10/22 0802 11/10/22 0806 11/10/22 0920 11/10/22 0938 11/10/22 1012 11/10/22 1040 11/10/22 1102 11/10/22 1144 11/10/22 1147 11/10/22 1209  NA 137   < > 141 141   < > 139 140 138 137 137 140  K 4.8   < > 4.3 4.3   < > 5.6* 5.7* 5.9* 5.6* 5.6* 4.8  CL 106  --  107 105  --  106  --  105  --  103  --   CO2 22  --   --   --   --   --   --   --   --   --   --   GLUCOSE 97  --  98 101*  --  106*  --  101*  --  169*  --   BUN 16  --  20 19  --  19  --  18  --  20  --   CREATININE 1.34*  --  1.40* 1.30*  --  1.20  --  1.20  --  1.20  --   CALCIUM 9.0  --   --   --   --   --   --   --   --   --   --    < > = values in this interval not displayed.   GFR: Estimated Creatinine Clearance: 54.9 mL/min (by C-G formula based on SCr of 1.2 mg/dL). Recent Labs  Lab 11/09/22 1101  WBC 6.7    Liver Function Tests: Recent Labs  Lab 11/09/22 1101  AST 29  ALT 26  ALKPHOS 71  BILITOT 0.8  PROT 6.7  ALBUMIN 4.2   No results for input(s): "LIPASE", "AMYLASE" in the last 168 hours. No results for input(s): "AMMONIA" in the last 168 hours.  ABG    Component Value Date/Time   PHART 7.368 11/10/2022 1209   PCO2ART 38.3 11/10/2022 1209   PO2ART 252 (H) 11/10/2022 1209   HCO3 22.0 11/10/2022 1209   TCO2 23 11/10/2022 1209   ACIDBASEDEF 3.0 (H) 11/10/2022 1209   O2SAT 100 11/10/2022 1209     Coagulation Profile: Recent Labs  Lab 11/09/22 1101  INR 1.1    Cardiac Enzymes: No results for input(s): "CKTOTAL", "CKMB", "CKMBINDEX", "TROPONINI" in the last 168 hours.  HbA1C: Hgb A1c MFr Bld  Date/Time Value Ref Range Status  11/09/2022 11:01 AM 5.8 (H) 4.8 - 5.6 % Final    Comment:    (NOTE)         Prediabetes: 5.7 - 6.4         Diabetes: >6.4         Glycemic control for  adults with diabetes: <7.0   07/09/2022  09:46 PM 6.3 (H) 4.8 - 5.6 % Final    Comment:    (NOTE)         Prediabetes: 5.7 - 6.4         Diabetes: >6.4         Glycemic control for adults with diabetes: <7.0     CBG: Recent Labs  Lab 11/09/22 1044 11/10/22 0639  GLUCAP 100* 100*    Review of Systems:   Unable to obtain as patient is intubated and sedated  Past Medical History:  He,  has a past medical history of Allergy, Anginal pain (HCC), Anxiety, CHF (congestive heart failure) (HCC), Coronary artery disease, Depression, Diabetes mellitus without complication (HCC), GERD (gastroesophageal reflux disease), Hepatitis C, HLD (hyperlipidemia) (08/09/2017), Hypertension, Hypoglycemia, Pneumonia (06/2022), and Stroke (HCC) (03/2021).   Surgical History:   Past Surgical History:  Procedure Laterality Date   APPENDECTOMY     BUBBLE STUDY  06/16/2021   Procedure: BUBBLE STUDY;  Surgeon: Sande Rives, MD;  Location: [facility] ENDOSCOPY;  Service: Cardiovascular;;   COLONOSCOPY     FRACTURE SURGERY Left    Ankle   LEFT HEART CATH AND CORONARY ANGIOGRAPHY N/A 07/12/2022   Procedure: LEFT HEART CATH AND CORONARY ANGIOGRAPHY;  Surgeon: Marykay Lex, MD;  Location: [facility] INVASIVE CV LAB;  Service: Cardiovascular;  Laterality: N/A;   TEE WITHOUT CARDIOVERSION N/A 06/16/2021   Procedure: TRANSESOPHAGEAL ECHOCARDIOGRAM (TEE);  Surgeon: Sande Rives, MD;  Location: [facility] ENDOSCOPY;  Service: Cardiovascular;  Laterality: N/A;   TONSILECTOMY, ADENOIDECTOMY, BILATERAL MYRINGOTOMY AND TUBES  1955     Social History:   reports that he has quit smoking. He has never used smokeless tobacco. He reports that he does not currently use alcohol. He reports that he does not use drugs.   Family History:  His family history includes Hyperlipidemia in his father; Hypertension in his father; Lung disease in his mother.   Allergies No Known Allergies   Home Medications  Prior to Admission medications   Medication Sig Start Date End  Date Taking? Authorizing Provider  atorvastatin (LIPITOR) 80 MG tablet Take 1 tablet (80 mg total) by mouth daily. 07/14/22 11/10/22 Yes Narda Bonds, MD  Carboxymethylcellulose Sodium (THERATEARS OP) Place 1 drop into both eyes daily as needed (dry eyes).   Yes [provider]  cholecalciferol (VITAMIN D3) 25 MCG (1000 UNIT) tablet Take 1 tablet (1,000 Units total) by mouth daily. Take 1,000 Units by mouth daily. 11/22/21  Yes Corwin Levins, MD  diphenhydrAMINE (BENADRYL) 25 MG tablet Take 25 mg by mouth daily.   Yes [provider]  Evolocumab (REPATHA SURECLICK) 140 MG/ML SOAJ Inject 140 mg into the skin every 14 days. 10/06/22  Yes O'Neal, Ronnald Ramp, MD  fenofibrate 160 MG tablet Take 1 tablet (160 mg total) by mouth daily. 11/09/22  Yes Corwin Levins, MD  fluticasone [facility]) 50 MCG/ACT nasal spray Place 2 sprays into both nostrils daily. 11/09/22  Yes Corwin Levins, MD  gabapentin (NEURONTIN) 300 MG capsule Take 1 capsule (300 mg total) by mouth at bedtime. 08/08/22  Yes Plotnikov, Georgina Quint, MD  hydrALAZINE (APRESOLINE) 25 MG tablet Take 1 tablet (25 mg total) by mouth every 8 (eight) hours. 10/11/22 01/09/23 Yes Corwin Levins, MD  isosorbide mononitrate (IMDUR) 60 MG 24 hr tablet Take 0.5 tablets (30 mg total) by mouth daily. Patient taking differently: Take 60 mg by mouth daily. 11/01/22  Yes Monge,  Petra Kuba, NP  losartan (COZAAR) 100 MG tablet Take 1 tablet (100 mg total) by mouth daily. 11/09/22  Yes Corwin Levins, MD  Magnesium 500 MG CAPS Take 500 mg by mouth daily.   Yes [provider]  metFORMIN (GLUCOPHAGE-XR) 500 MG 24 hr tablet Take 1 tablet (500 mg) by mouth daily with breakfast. 11/09/22  Yes Corwin Levins, MD  metoprolol tartrate (LOPRESSOR) 25 MG tablet Take 0.5 tablets (12.5 mg total) by mouth 2 (two) times daily. 07/25/22 11/22/22 Yes Eugenio Hoes, MD  Multiple Vitamins-Minerals (HAIR/SKIN/NAILS/BIOTIN PO) Take 2 tablets by mouth daily.   Yes [provider]  nitroGLYCERIN (NITROSTAT) 0.4 MG SL tablet Place 1 tablet under the tongue every 5 minutes as needed for chest pain. 10/06/22 01/04/23 Yes O'Neal, Ronnald Ramp, MD  PARoxetine (PAXIL) 40 MG tablet Take 1 tablet (40 mg total) by mouth every morning. 11/09/22  Yes Corwin Levins, MD  vitamin E 180 MG (400 UNITS) capsule Take 400 Units by mouth daily.   Yes [provider]  zolpidem (AMBIEN) 10 MG tablet TAKE 1 TABLET(10 MG) BY MOUTH AT BEDTIME AS NEEDED FOR SLEEP Patient taking differently: Take 5-10 mg by mouth at bedtime. 09/22/22  Yes Corwin Levins, MD  clopidogrel (PLAVIX) 75 MG tablet TAKE 1 TABLET BY MOUTH DAILY 09/23/22   Corwin Levins, MD  Homeopathic Products [facility] MUSCLE CRAMPS EX) Apply 1 Application topically daily as needed (muscle cramps).    [provider]     Critical care time:     The patient is critically ill due to s/p CABG x 5, requires titration of pressors and weaning from ventilator.  Critical care was necessary to treat or prevent imminent or life-threatening deterioration.  Critical care was time spent personally by me on the following activities: development of treatment plan with patient and/or surrogate as well as nursing, discussions with consultants, evaluation of patient's response to treatment, examination of patient, obtaining history from patient or surrogate, ordering and performing treatments and interventions, ordering and review of laboratory studies, ordering and review of radiographic studies, pulse oximetry, re-evaluation of patient's condition and participation in multidisciplinary rounds.   During this encounter critical care time was devoted to patient care services described in this note for 36 minutes.     Cheri Fowler, MD [facility] See Amion for pager If no response to pager, please call [phone] until 7pm After 7pm, Please call E-link [phone]

## 2022-11-10 NOTE — Anesthesia Procedure Notes (Signed)
Procedure Name: Intubation Date/Time: 11/10/2022 7:47 AM  Performed by: Audie Pinto, CRNAPre-anesthesia Checklist: Patient identified, Emergency Drugs available, Suction available and Patient being monitored Patient Re-evaluated:Patient Re-evaluated prior to induction Oxygen Delivery Method: Circle system utilized Preoxygenation: Pre-oxygenation with 100% oxygen Induction Type: IV induction Ventilation: Mask ventilation without difficulty and Oral airway inserted - appropriate to patient size Laryngoscope Size: Mac and 4 Grade View: Grade I Tube type: Oral Tube size: 8.0 mm Number of attempts: 1 Airway Equipment and Method: Stylet and Oral airway Placement Confirmation: ETT inserted through vocal cords under direct vision, positive ETCO2 and breath sounds checked- equal and bilateral Secured at: 23 cm Tube secured with: Tape Dental Injury: Teeth and Oropharynx as per pre-operative assessment  Comments: Intubation by [facility]

## 2022-11-10 NOTE — Interval H&P Note (Signed)
History and Physical Interval Note:  11/10/2022 6:27 AM  Collin Bridges  has presented today for surgery, with the diagnosis of CAD.  The various methods of treatment have been discussed with the patient and family. After consideration of risks, benefits and other options for treatment, the patient has consented to  Procedure(s): CORONARY ARTERY BYPASS GRAFTING (CABG) (N/A) TRANSESOPHAGEAL ECHOCARDIOGRAM (N/A) as a surgical intervention.  The patient's history has been reviewed, patient examined, no change in status, stable for surgery.  I have reviewed the patient's chart and labs.  Questions were answered to the patient's satisfaction.     Eugenio Hoes

## 2022-11-10 NOTE — Anesthesia Procedure Notes (Signed)
Central Venous Catheter Insertion Performed by: Marcene Duos, MD, anesthesiologist Start/End6/27/2024 6:45 AM, 11/10/2022 7:00 AM Patient location: Pre-op. Preanesthetic checklist: patient identified, IV checked, site marked, risks and benefits discussed, surgical consent, monitors and equipment checked, pre-op evaluation, timeout performed and anesthesia consent Position: Trendelenburg Lidocaine 1% used for infiltration and patient sedated Hand hygiene performed , maximum sterile barriers used  and Seldinger technique used Catheter size: 9 Fr Total catheter length 10. Central line was placed.MAC introducer Swan type:thermodilution PA Cath depth:50 Procedure performed using ultrasound guided technique. Ultrasound Notes:anatomy identified, needle tip was noted to be adjacent to the nerve/plexus identified, no ultrasound evidence of intravascular and/or intraneural injection and image(s) printed for medical record Attempts: 1 Following insertion, line sutured, dressing applied and Biopatch. Post procedure assessment: blood return through all ports, free fluid flow and no air  Patient tolerated the procedure well with no immediate complications.

## 2022-11-10 NOTE — Op Note (Signed)
[address]       [place] [address]             [phone]                                          11/10/2022 Patient:  Collin Bridges  Pre-Op Dx: Coronary artery disease    Post-op Dx:  same  Procedure: CABG X 5 with the LIMA to the LAD and RSVG from the aorta to the Right coronary artery and from the aorta to the Diagonal, sequenced to the OM1, sequenced to the OM2   Endoscopic greater saphenous vein harvest on the right and left leg   Surgeon and Role:      Eugenio Hoes, MD- Primary    * Doree Fudge , PA-C - assisting An experienced assistant was required given the complexity of this surgery and the standard of surgical care. The assistant was needed for exposure, dissection, suctioning, retraction of delicate tissues and sutures, instrument exchange and for overall help during this procedure.    Anesthesia  general  Blood Administration: 1 unit PRBC  Xclamp Time:  75 min  Pump Time:   Drains: Chest tubes x 3 to the anterior and posterior mediastinum and to the left pleural space  Wires: 7  Counts: correct   Indications: 77 yo male with NYHA class 2-3 symptoms of severe CAD with normal LV function. Pt appears to have recovered the best he will after his admission and we will proceed with CABG x 5   Findings: The LV had normal function with a mean gradient of across aortic valve. The vessels were all highly calcified and on the small side of just 1.34mm. At conclusion of case pt had normal LV function.  Operation: The patient was brought to the operating room theater and placed on the table in supine position.  After general anesthesia was obtained with use of an endotracheal tube chest abdomen and legs were prepped and draped in sterile fashion.  Median sternotomy incision was then created and sternal valve sternal saw.  Simultaneously from both lower extremities utilizing the endovascular technique saphenous  vein was harvested by the assistant and this wound these wounds were closed multilayer's absorbable suture. The left internal mammary artery was harvested and packed with fiber soaked sponge.  Pericardial well was developed and heparin was delivered.  Aorta was cannulated with a 20 French aortic cannula and a two-stage cannula placed right atrium for venous return.  Antegrade and retrograde cardioplegia catheters were placed in the ascending aorta and coronary sinus respectively.  With adequate confirmation of anticoagulation cardiopulmonary bypass was instituted. Aortic cross-clamp was placed and cold blood potassium cardioplegia delivered for total of 5 minutes following arrest this was delivered antegrade and retrograde.  Additional cold blood potassium cardioplegia was delivered retrograde over 20 minutes prior to cross-clamp.  And a hotshot cardioplegia dose was delivered just prior to cross-clamp removal. The right coronary artery was opened utilizing a piece of reverse saphenous vein graft the end-to-side anastomosis was constructed utilizing 7-0 Prolene suture.  It was flushed de-aired and found to be hemostatic. Second piece of reverse saphenous vein graft was fashioned as an end-to-side anastomosis to the distal obtuse marginal performed with 7-0 Prolene sutures.  This was brought back as a side-to-side anastomosis to the first obtuse marginal  coronary artery and then it back again as a side-to-side anastomosis to the diagonal coronary artery.  There flushed here and found hemostatic. The mammary artery was then anastomosed to the distal LAD in an end-to-side fashion utilizing 8-0 Prolene sutures.  It was flushed de-aired and found hemostatic.  The pedicle was pexed the anterior surface of the heart with 5-0 Prolene sutures. Aortic cross-clamp was removed and a partial clamp placed on the ascending aorta and the 2 proximal anastomoses were brought out through 4.0 mm aortic punctures.  Proximal vein  graft markers were utilized.  Partial clamp was removed and the vein grafts were de-aired in routine fashion and the proximal and distal anastomoses were examined and were hemostatic. Ventricular and atrial pacing wires were then placed and brought out inferior stab was and secured with adequate hemodynamics patient was weaned from cardiopulmonary bypass with a small amount of alpha support.  With adequate hemodynamics protamine was delivered and the patient was decannulated and sites oversewn were necessary.  Chest tubes were brought inferior stab wounds and secured with adequate hemostasis the sternum was reapproximated with interrupted stainless steel wire and the presternal subcutaneous tissue and skin were closed in multiple layers absorbable suture.  Sterile dressings were applied.

## 2022-11-10 NOTE — Anesthesia Procedure Notes (Signed)
Arterial Line Insertion Start/End6/27/2024 6:55 AM Performed by: Audie Pinto, CRNA, CRNA  Patient location: Pre-op. Preanesthetic checklist: patient identified, risks and benefits discussed and pre-op evaluation Lidocaine 1% used for infiltration and patient sedated Left, radial was placed Catheter size: 20 G Hand hygiene performed   Attempts: 2 Procedure performed without using ultrasound guided technique. Following insertion, dressing applied and Biopatch. Post procedure assessment: unchanged  Patient tolerated the procedure well with no immediate complications. Additional procedure comments: Attempt x1 by SRNA unsuccessful, attempt x1 by CRNA.

## 2022-11-10 NOTE — Discharge Instructions (Signed)

## 2022-11-10 NOTE — Progress Notes (Signed)
  Echocardiogram Echocardiogram Transesophageal has been performed.  Janalyn Harder 11/10/2022, 8:47 AM

## 2022-11-10 NOTE — Hospital Course (Addendum)
HPI: This is a 77yo male who was admitted in February with sepsis and Community Aquired Pneumonia. He was found to have chest pain, elevated troponin, and ruled in for a NSTEMI. On echo , he had a normal LVEF with some regional wall motion issues. Cardiac cath showed severe 3 vessel coronary artery disease. Pt was also found to have possibly new CVA by MRI along with meningioma, which is unchanged. Patient was optimized and sent home to recover prior to consideration for CABG. At the time of exam, patient denied chest pain but does have shortness of breath with activity. He occasionally looses his memory and feels its the lack of blood flow to brain. He has been seen by Cardiology since discharge and was felt still weak requiring more rehab prior to surgery. Pt also has diabetes mellitus and he describes inherited hypoglycemia. He has CKD (Stage III).  He was seen again by Dr. Leafy Ro in the office on 10/31/2022. Patient now has low level exertional angina to where he is taking NTG and is unable to do much walking secondary to fatigue also. Potential risks, benefits, and complications of the surgery were again discussed with the patient and he is agreeable to proceed with surgery. He was instructed to stop Plavix 7 days before the surgery. Pre operative carotid duplex showed no significant internal carotid artery stenosis bilaterally.  Hospital Course: The patient underwent a CABG x 5. He was transferred from the OR to [facility] ICU in stable condition.He was extubated post op day one. He was weaned off Vasopressin drip.  A line, chest tubes, and foley were removed early in his post operative course. He had expected post op blood loss anemia. He did require a post op transfusion. He also had thrombocytopenia and platelet count went down to 56,000. Platelet count gradually did increase thereafter. He was weaned off the Insulin drip. His pre op HGA1C was 5.8. He will be restarted on Metformin once tolerating a diet  better and closer to discharge. He had a vagal episode when standing, he was atrial paced at 90. He was volume overload but had low filling pressures, he was give Lasix and albumin. His thrombocytopenia continued to worsen and his platelet count was 56,000 after transfusion. Enoxaparin and heparin were discontinued and HIT panel was checked. He went into atrial fibrillation with RVR and was started on an Amiodarone drip. He converted to sinus rhythm, amiodarone drip was transitioned to PO Amiodarone. He had expected postoperative acute blood loss anemia and was transfused with 1 unit of PRBCs. He was severely deconditioned, PT and OT evaluations were ordered.  They recommended SNF placement.  He had an AKI on CKD with a creatinine that increased to 1.77 on 06/30. Creatinine did decrease with time.  The patient's hemoglobin level improved to Hgb to 10.2.  He was hypertensive and his Lopressor was increased and his home Hydralazine was resumed.  He was volume overloaded and treated with IV lasix with good response.  He was felt stable for transfer to the progressive care unit on 11/15/2022.  The patient was transitioned to oral Lasix.  He had TED hose placed to help with lower extremity edema.  His creatinine level remained stable at 1.37. His incisions were healing well without sign of infection. His ambulation was advancing but due to his severe deconditioning he would require SNF at discharge.  He developed complaints of dry eyes in setting of known cataract disease.  He was started on artifical tears to assist with  this.   SNF was arranged and he was felt stable for discharge on 11/21/2022.

## 2022-11-10 NOTE — Anesthesia Procedure Notes (Signed)
Central Venous Catheter Insertion Performed by: Marcene Duos, MD, anesthesiologist Start/End6/27/2024 6:45 AM, 11/10/2022 7:00 AM Patient location: Pre-op. Preanesthetic checklist: patient identified, IV checked, site marked, risks and benefits discussed, surgical consent, monitors and equipment checked, pre-op evaluation, timeout performed and anesthesia consent Hand hygiene performed  and maximum sterile barriers used  PA cath was placed.Swan type:thermodilution PA Cath depth:50 Procedure performed without using ultrasound guided technique. Attempts: 1 Patient tolerated the procedure well with no immediate complications.

## 2022-11-10 NOTE — Brief Op Note (Signed)
11/10/2022  11:58 AM  PATIENT:  Collin Bridges  77 y.o. male  PRE-OPERATIVE DIAGNOSIS:  Coronary Artery Disease  POST-OPERATIVE DIAGNOSIS:  Coronary Artery Disease  PROCEDURE: MEDIAN STERNOTOMY for CORONARY ARTERY BYPASS GRAFTING (CABG) X 5 (LIMA to LAD, SVG SEQUENTIALLY TO DIAGONAL, OM1, OM2, SVT to DISTAL RCA) USING LEFT INTERNAL MAMMARY ARTERY (LIMA) AND ENDOSCOPICALLY HARVESTED RIGHT AND LEFT GREATER SAPHENOUS VEIN and LEFT GREATER SAPHENOUS THIGH VEIN Vein harvest time: 50 min Vein prep time:  SURGEON:  Surgeon(s) and Role:    Eugenio Hoes, MD - Primary  PHYSICIAN ASSISTANT: Doree Fudge PA-C  ASSISTANTS: Tanda Rockers RNFA   ANESTHESIA:   general  EBL: Per anesthesia and perfusion record  DRAINS:  Chest tubes placed in the mediastinal and pleural spaces    COUNTS CORRECT:  YES  DICTATION: .Dragon Dictation  PLAN OF CARE: Admit to inpatient   PATIENT DISPOSITION:  ICU - intubated and hemodynamically stable.   Delay start of Pharmacological VTE agent (>24hrs) due to surgical blood loss or risk of bleeding: no  BASELINE WEIGHT: 82.28 kg

## 2022-11-11 ENCOUNTER — Inpatient Hospital Stay (HOSPITAL_COMMUNITY): Payer: 59

## 2022-11-11 ENCOUNTER — Encounter (HOSPITAL_COMMUNITY): Payer: Self-pay | Admitting: Thoracic Surgery (Cardiothoracic Vascular Surgery)

## 2022-11-11 DIAGNOSIS — J9602 Acute respiratory failure with hypercapnia: Secondary | ICD-10-CM | POA: Diagnosis not present

## 2022-11-11 DIAGNOSIS — I25119 Atherosclerotic heart disease of native coronary artery with unspecified angina pectoris: Secondary | ICD-10-CM | POA: Diagnosis not present

## 2022-11-11 DIAGNOSIS — N179 Acute kidney failure, unspecified: Secondary | ICD-10-CM

## 2022-11-11 LAB — CBC
HCT: 20.2 % — ABNORMAL LOW (ref 39.0–52.0)
HCT: 23.9 % — ABNORMAL LOW (ref 39.0–52.0)
HCT: 27.2 % — ABNORMAL LOW (ref 39.0–52.0)
Hemoglobin: 6.8 g/dL — CL (ref 13.0–17.0)
Hemoglobin: 8 g/dL — ABNORMAL LOW (ref 13.0–17.0)
Hemoglobin: 9 g/dL — ABNORMAL LOW (ref 13.0–17.0)
MCH: 29.5 pg (ref 26.0–34.0)
MCH: 30 pg (ref 26.0–34.0)
MCH: 30.9 pg (ref 26.0–34.0)
MCHC: 33.1 g/dL (ref 30.0–36.0)
MCHC: 33.5 g/dL (ref 30.0–36.0)
MCHC: 33.7 g/dL (ref 30.0–36.0)
MCV: 89.2 fL (ref 80.0–100.0)
MCV: 89.5 fL (ref 80.0–100.0)
MCV: 91.8 fL (ref 80.0–100.0)
Platelets: 61 10*3/uL — ABNORMAL LOW (ref 150–400)
Platelets: 85 10*3/uL — ABNORMAL LOW (ref 150–400)
Platelets: 92 10*3/uL — ABNORMAL LOW (ref 150–400)
RBC: 2.2 MIL/uL — ABNORMAL LOW (ref 4.22–5.81)
RBC: 2.67 MIL/uL — ABNORMAL LOW (ref 4.22–5.81)
RBC: 3.05 MIL/uL — ABNORMAL LOW (ref 4.22–5.81)
RDW: 14.6 % (ref 11.5–15.5)
RDW: 14.9 % (ref 11.5–15.5)
RDW: 15.4 % (ref 11.5–15.5)
WBC: 9.5 10*3/uL (ref 4.0–10.5)
WBC: 9.6 10*3/uL (ref 4.0–10.5)
WBC: 9.9 10*3/uL (ref 4.0–10.5)
nRBC: 0 % (ref 0.0–0.2)
nRBC: 0 % (ref 0.0–0.2)
nRBC: 0 % (ref 0.0–0.2)

## 2022-11-11 LAB — BPAM PLATELET PHERESIS
Blood Product Expiration Date: 202406282359
Blood Product Expiration Date: 202406302359
ISSUE DATE / TIME: 202406271619
Unit Type and Rh: 6200
Unit Type and Rh: 6200

## 2022-11-11 LAB — POCT I-STAT 7, (LYTES, BLD GAS, ICA,H+H)
Acid-base deficit: 3 mmol/L — ABNORMAL HIGH (ref 0.0–2.0)
Acid-base deficit: 6 mmol/L — ABNORMAL HIGH (ref 0.0–2.0)
Acid-base deficit: 5 mmol/L — ABNORMAL HIGH (ref 0.0–2.0)
Acid-base deficit: 6 mmol/L — ABNORMAL HIGH (ref 0.0–2.0)
Acid-base deficit: 5 mmol/L — ABNORMAL HIGH (ref 0.0–2.0)
Acid-base deficit: 8 mmol/L — ABNORMAL HIGH (ref 0.0–2.0)
Bicarbonate: 20 mmol/L (ref 20.0–28.0)
Bicarbonate: 23.5 mmol/L (ref 20.0–28.0)
Bicarbonate: 20.8 mmol/L (ref 20.0–28.0)
Bicarbonate: 22.3 mmol/L (ref 20.0–28.0)
Bicarbonate: 21.4 mmol/L (ref 20.0–28.0)
Bicarbonate: 19.5 mmol/L — ABNORMAL LOW (ref 20.0–28.0)
Calcium, Ion: 1.15 mmol/L (ref 1.15–1.40)
Calcium, Ion: 1.16 mmol/L (ref 1.15–1.40)
Calcium, Ion: 1.21 mmol/L (ref 1.15–1.40)
Calcium, Ion: 1.21 mmol/L (ref 1.15–1.40)
Calcium, Ion: 1.21 mmol/L (ref 1.15–1.40)
Calcium, Ion: 1.15 mmol/L (ref 1.15–1.40)
HCT: 17 % — ABNORMAL LOW (ref 39.0–52.0)
HCT: 20 % — ABNORMAL LOW (ref 39.0–52.0)
HCT: 21 % — ABNORMAL LOW (ref 39.0–52.0)
HCT: 22 % — ABNORMAL LOW (ref 39.0–52.0)
HCT: 20 % — ABNORMAL LOW (ref 39.0–52.0)
HCT: 25 % — ABNORMAL LOW (ref 39.0–52.0)
Hemoglobin: 5.8 g/dL — CL (ref 13.0–17.0)
Hemoglobin: 6.8 g/dL — CL (ref 13.0–17.0)
Hemoglobin: 7.1 g/dL — ABNORMAL LOW (ref 13.0–17.0)
Hemoglobin: 7.5 g/dL — ABNORMAL LOW (ref 13.0–17.0)
Hemoglobin: 6.8 g/dL — CL (ref 13.0–17.0)
Hemoglobin: 8.5 g/dL — ABNORMAL LOW (ref 13.0–17.0)
O2 Saturation: 98 %
O2 Saturation: 99 %
O2 Saturation: 97 %
O2 Saturation: 97 %
O2 Saturation: 98 %
O2 Saturation: 97 %
Patient temperature: 36.9
Patient temperature: 37.1
Patient temperature: 37.2
Patient temperature: 37.3
Patient temperature: 37.2
Patient temperature: 37.3
Potassium: 4.4 mmol/L (ref 3.5–5.1)
Potassium: 4.7 mmol/L (ref 3.5–5.1)
Potassium: 4.6 mmol/L (ref 3.5–5.1)
Potassium: 4.6 mmol/L (ref 3.5–5.1)
Potassium: 4.5 mmol/L (ref 3.5–5.1)
Potassium: 4.7 mmol/L (ref 3.5–5.1)
Sodium: 140 mmol/L (ref 135–145)
Sodium: 142 mmol/L (ref 135–145)
Sodium: 140 mmol/L (ref 135–145)
Sodium: 140 mmol/L (ref 135–145)
Sodium: 139 mmol/L (ref 135–145)
Sodium: 140 mmol/L (ref 135–145)
TCO2: 21 mmol/L — ABNORMAL LOW (ref 22–32)
TCO2: 25 mmol/L (ref 22–32)
TCO2: 22 mmol/L (ref 22–32)
TCO2: 24 mmol/L (ref 22–32)
TCO2: 23 mmol/L (ref 22–32)
TCO2: 21 mmol/L — ABNORMAL LOW (ref 22–32)
pCO2 arterial: 40.8 mmHg (ref 32–48)
pCO2 arterial: 49.5 mmHg — ABNORMAL HIGH (ref 32–48)
pCO2 arterial: 48.2 mmHg — ABNORMAL HIGH (ref 32–48)
pCO2 arterial: 52.9 mmHg — ABNORMAL HIGH (ref 32–48)
pCO2 arterial: 47.1 mmHg (ref 32–48)
pCO2 arterial: 47.6 mmHg (ref 32–48)
pH, Arterial: 7.286 — ABNORMAL LOW (ref 7.35–7.45)
pH, Arterial: 7.297 — ABNORMAL LOW (ref 7.35–7.45)
pH, Arterial: 7.233 — ABNORMAL LOW (ref 7.35–7.45)
pH, Arterial: 7.245 — ABNORMAL LOW (ref 7.35–7.45)
pH, Arterial: 7.266 — ABNORMAL LOW (ref 7.35–7.45)
pH, Arterial: 7.222 — ABNORMAL LOW (ref 7.35–7.45)
pO2, Arterial: 115 mmHg — ABNORMAL HIGH (ref 83–108)
pO2, Arterial: 130 mmHg — ABNORMAL HIGH (ref 83–108)
pO2, Arterial: 110 mmHg — ABNORMAL HIGH (ref 83–108)
pO2, Arterial: 113 mmHg — ABNORMAL HIGH (ref 83–108)
pO2, Arterial: 112 mmHg — ABNORMAL HIGH (ref 83–108)
pO2, Arterial: 106 mmHg (ref 83–108)

## 2022-11-11 LAB — GLUCOSE, CAPILLARY
Glucose-Capillary: 111 mg/dL — ABNORMAL HIGH (ref 70–99)
Glucose-Capillary: 113 mg/dL — ABNORMAL HIGH (ref 70–99)
Glucose-Capillary: 120 mg/dL — ABNORMAL HIGH (ref 70–99)
Glucose-Capillary: 121 mg/dL — ABNORMAL HIGH (ref 70–99)
Glucose-Capillary: 127 mg/dL — ABNORMAL HIGH (ref 70–99)
Glucose-Capillary: 127 mg/dL — ABNORMAL HIGH (ref 70–99)
Glucose-Capillary: 131 mg/dL — ABNORMAL HIGH (ref 70–99)
Glucose-Capillary: 131 mg/dL — ABNORMAL HIGH (ref 70–99)
Glucose-Capillary: 143 mg/dL — ABNORMAL HIGH (ref 70–99)
Glucose-Capillary: 164 mg/dL — ABNORMAL HIGH (ref 70–99)
Glucose-Capillary: 165 mg/dL — ABNORMAL HIGH (ref 70–99)
Glucose-Capillary: 166 mg/dL — ABNORMAL HIGH (ref 70–99)

## 2022-11-11 LAB — BASIC METABOLIC PANEL
Anion gap: 8 (ref 5–15)
Anion gap: 8 (ref 5–15)
BUN: 17 mg/dL (ref 8–23)
BUN: 19 mg/dL (ref 8–23)
CO2: 21 mmol/L — ABNORMAL LOW (ref 22–32)
CO2: 22 mmol/L (ref 22–32)
Calcium: 7.7 mg/dL — ABNORMAL LOW (ref 8.9–10.3)
Calcium: 7.4 mg/dL — ABNORMAL LOW (ref 8.9–10.3)
Chloride: 111 mmol/L (ref 98–111)
Chloride: 109 mmol/L (ref 98–111)
Creatinine, Ser: 1.58 mg/dL — ABNORMAL HIGH (ref 0.61–1.24)
Creatinine, Ser: 1.68 mg/dL — ABNORMAL HIGH (ref 0.61–1.24)
GFR, Estimated: 45 mL/min — ABNORMAL LOW (ref >60)
GFR, Estimated: 42 mL/min — ABNORMAL LOW (ref >60)
Glucose, Bld: 117 mg/dL — ABNORMAL HIGH (ref 70–99)
Glucose, Bld: 169 mg/dL — ABNORMAL HIGH (ref 70–99)
Potassium: 4.3 mmol/L (ref 3.5–5.1)
Potassium: 4.4 mmol/L (ref 3.5–5.1)
Sodium: 140 mmol/L (ref 135–145)
Sodium: 139 mmol/L (ref 135–145)

## 2022-11-11 LAB — PREPARE PLATELET PHERESIS
Unit division: 0
Unit division: 0

## 2022-11-11 LAB — PREPARE RBC (CROSSMATCH)

## 2022-11-11 LAB — TYPE AND SCREEN: Antibody Screen: NEGATIVE

## 2022-11-11 LAB — MAGNESIUM
Magnesium: 2.6 mg/dL — ABNORMAL HIGH (ref 1.7–2.4)
Magnesium: 2.5 mg/dL — ABNORMAL HIGH (ref 1.7–2.4)

## 2022-11-11 LAB — BPAM RBC
Blood Product Expiration Date: 202407312359
ISSUE DATE / TIME: 202406271114

## 2022-11-11 LAB — PLATELET COUNT: Platelets: 62 10*3/uL — ABNORMAL LOW (ref 150–400)

## 2022-11-11 MED ORDER — SODIUM CHLORIDE 0.9% IV SOLUTION: Freq: Once | INTRAVENOUS | Status: AC

## 2022-11-11 MED ORDER — SODIUM BICARBONATE 8.4 % IV SOLN: 100.0000 meq | Freq: Once | INTRAVENOUS | Status: AC

## 2022-11-11 MED ORDER — ORAL CARE MOUTH RINSE
15.0000 mL | OROMUCOSAL | Status: DC
  Administered 2022-11-11 – 2022-11-13 (×7): 15 mL via OROMUCOSAL

## 2022-11-11 MED ORDER — PHENYLEPHRINE HCL-NACL 20-0.9 MG/250ML-% IV SOLN
0.0000 ug/min | INTRAVENOUS | Status: DC
  Administered 2022-11-11: 50 ug/min via INTRAVENOUS
  Filled 2022-11-11 (×2): qty 250

## 2022-11-11 MED ORDER — VASOPRESSIN 20 UNITS/100 ML INFUSION FOR SHOCK
0.0000 [IU]/min | INTRAVENOUS | Status: DC
  Administered 2022-11-11 (×3): 0.04 [IU]/min via INTRAVENOUS
  Administered 2022-11-12: 0.03 [IU]/min via INTRAVENOUS
  Filled 2022-11-11 (×5): qty 100

## 2022-11-11 MED ORDER — SODIUM BICARBONATE 8.4 % IV SOLN
INTRAVENOUS | Status: AC
  Administered 2022-11-11: 100 meq via INTRAVENOUS
  Filled 2022-11-11: qty 100

## 2022-11-11 MED ORDER — ENOXAPARIN SODIUM 30 MG/0.3ML IJ SOSY: 30.0000 mg | PREFILLED_SYRINGE | Freq: Every day | INTRAMUSCULAR | Status: DC

## 2022-11-11 MED ORDER — INSULIN ASPART 100 UNIT/ML IJ SOLN
0.0000 [IU] | INTRAMUSCULAR | Status: DC
  Administered 2022-11-11 (×3): 4 [IU] via SUBCUTANEOUS
  Administered 2022-11-11: 2 [IU] via SUBCUTANEOUS
  Administered 2022-11-12 (×2): 4 [IU] via SUBCUTANEOUS

## 2022-11-11 MED ORDER — INSULIN ASPART 100 UNIT/ML IJ SOLN: 0.0000 [IU] | INTRAMUSCULAR | Status: DC

## 2022-11-11 MED ORDER — SODIUM CHLORIDE 0.9% IV SOLUTION: Freq: Once | INTRAVENOUS | Status: DC

## 2022-11-11 NOTE — Progress Notes (Signed)
NAME:  Collin Bridges, MRN:  [id number], DOB:  1945/10/06, LOS: 1 ADMISSION DATE:  11/10/2022, CONSULTATION DATE: 11/10/2022 REFERRING MD: Dr. Leafy Ro, CHIEF COMPLAINT: S/p CABG x 5  History of Present Illness:  77 year old male with multivessel coronary artery disease s/p NSTEMI last year, diabetes, hypertension, hyperlipidemia, hepatitis C and chronic HFrEF who underwent elective CABG x 5, in OR patient was noted to be wheezing as he was on Plavix, required 1 unit of PRBC.  220 of Cell Saver.  He remained intubated was transferred to ICU.  PCCM was consulted for help evaluation medical management  Pertinent  Medical History   Past Medical History:  Diagnosis Date   Allergy    seasonal   Anginal pain (HCC)    Anxiety    CHF (congestive heart failure) (HCC)    Coronary artery disease    Depression    Diabetes mellitus without complication (HCC)    Type 2   GERD (gastroesophageal reflux disease)    Pt states "take ambien for this"   Hepatitis C    HLD (hyperlipidemia) 08/09/2017   Hypertension    Hypoglycemia    Pneumonia 06/2022   Stroke (HCC) 03/2021   TIA     Significant Hospital Events: Including procedures, antibiotic start and stop dates in addition to other pertinent events     Interim History / Subjective:  Patient did not pass rapid weaning trial He was taking very little volume on pressure support with pH remaining 7.2 with pCO2 in the 50s Remain on full support mechanical ventilation this morning Remains on nitroglycerin infusion  Objective   Blood pressure 124/64, pulse 89, temperature 99 F (37.2 C), resp. rate (!) 29, height 5\' 11"  (1.803 m), weight 88.4 kg, SpO2 97 %. PAP: (21-38)/(9-18) 21/9 CO:  [2.6 L/min-5.2 L/min] 5.2 L/min CI:  [1.3 L/min/m2-2.6 L/min/m2] 2.6 L/min/m2  Vent Mode: PCV;BIPAP FiO2 (%):  [40 %-50 %] 40 % Set Rate:  [4 bmp-16 bmp] 14 bmp Vt Set:  [600 mL] 600 mL PEEP:  [5 cmH20] 5 cmH20 Pressure Support:  [10 cmH20] 10  cmH20 Plateau Pressure:  [6 cmH20-16 cmH20] 6 cmH20   Intake/Output Summary (Last 24 hours) at 11/11/2022 4098 Last data filed at 11/11/2022 0800 Gross per 24 hour  Intake 9281.21 ml  Output 3965 ml  Net 5316.21 ml   Filed Weights   11/10/22 0548 11/11/22 0500  Weight: 82.8 kg 88.4 kg    Examination:   Physical exam: General: Crtitically ill-appearing male, orally intubated HEENT: [place]/AT, eyes anicteric.  ETT and OGT in place Neuro: Opens eyes with stimuli, following simple commands Chest: Central sternotomy wound looks clean and dry coarse breath sounds, no wheezes or rhonchi.  Chest tube in place Heart: Regular rate and rhythm, no murmurs or gallops Abdomen: Soft, nondistended, bowel sounds present Skin: No rash  Labs and images were reviewed  Resolved Hospital Problem list     Assessment & Plan:  Coronary artery disease s/p CABG x 5 Continue aspirin and statin Chest tube management TCTS Continue to titrate Precedex with RASS goal 0/-1 Continue pain control with tramadol, oxycodone and morphine Closely monitor chest tube output On nitroglycerin due to hypotension  Acute hypercapnic/hypoxic respiratory failure Continue on protective ventilation VAP prevention bundle in place Patient failed repair weaning trial due to hypercapnia and pH remain at 7.2 We will give him trial of extubation this morning to BiPAP  Chronic HFpEF Monitor intake and output EF 60 to 65% with diastolic dysfunction GDMT once  able to tolerate  AKI, postsurgery Monitor intake and output Avoid nephrotoxic agents Repeat serum creatinine in the morning  Hypertension On nitroglycerin infusion once he is extubated and comes off of BiPAP will start oral antihypertensive meds  Hyperlipidemia Continue atorvastatin  Diabetes type 2 Patient hemoglobin A1c is 5.8 Currently on insulin infusion, monitor fingerstick with goal 140-180  Expected perioperative blood loss anemia Thrombocytopenia due  to CPB S/p 2 unit of PRBC and 1 unit of platelet Monitor H&H and platelet count Will transfuse 1 more unit of PRBC to keep hemoglobin around 8  Best Practice (right click and "Reselect all SmartList Selections" daily)   Diet/type: NPO bedside swallow evaluation postextubation DVT prophylaxis: SCD GI prophylaxis: PPI Lines: Central line, Arterial Line, and yes and it is still needed Foley:  Yes, and it is still needed Code Status:  full code Last date of multidisciplinary goals of care discussion [Per primary team]  Labs   CBC: Recent Labs  Lab 11/10/22 1304 11/10/22 1846 11/10/22 1945 11/11/22 0003 11/11/22 0007 11/11/22 0345 11/11/22 0548  WBC 7.9 9.0 8.8  --  9.9  --  9.6  HGB 7.2* 7.6* 7.2* 5.8* 6.8* 6.8* 8.0*  HCT 21.7* 22.5* 21.4* 17.0* 20.2* 20.0* 23.9*  MCV 93.5 91.1 91.8  --  91.8  --  89.5  PLT 66* 93* 91*  --  92*  --  85*    Basic Metabolic Panel: Recent Labs  Lab 11/09/22 1101 11/10/22 0802 11/10/22 1102 11/10/22 1144 11/10/22 1147 11/10/22 1209 11/10/22 1846 11/10/22 1945 11/11/22 0003 11/11/22 0345 11/11/22 0548  NA 137   < > 138   < > 137   < > 137 138 140 142 140  K 4.8   < > 5.9*   < > 5.6*   < > 4.1 4.4 4.7 4.4 4.3  CL 106   < > 105  --  103  --  110 111  --   --  111  CO2 22  --   --   --   --   --  20* 20*  --   --  21*  GLUCOSE 97   < > 101*  --  169*  --  118* 110*  --   --  117*  BUN 16   < > 18  --  20  --  15 17  --   --  17  CREATININE 1.34*   < > 1.20  --  1.20  --  1.45* 1.48*  --   --  1.58*  CALCIUM 9.0  --   --   --   --   --  7.6* 7.5*  --   --  7.7*  MG  --   --   --   --   --   --  3.1* 2.9*  --   --  2.6*   < > = values in this interval not displayed.   GFR: Estimated Creatinine Clearance: 41.7 mL/min (A) (by C-G formula based on SCr of 1.58 mg/dL (H)). Recent Labs  Lab 11/10/22 1846 11/10/22 1945 11/11/22 0007 11/11/22 0548  WBC 9.0 8.8 9.9 9.6    Liver Function Tests: Recent Labs  Lab 11/09/22 1101  AST 29   ALT 26  ALKPHOS 71  BILITOT 0.8  PROT 6.7  ALBUMIN 4.2   No results for input(s): "LIPASE", "AMYLASE" in the last 168 hours. No results for input(s): "AMMONIA" in the last 168 hours.  ABG  Component Value Date/Time   PHART 7.297 (L) 11/11/2022 0345   PCO2ART 40.8 11/11/2022 0345   PO2ART 115 (H) 11/11/2022 0345   HCO3 20.0 11/11/2022 0345   TCO2 21 (L) 11/11/2022 0345   ACIDBASEDEF 6.0 (H) 11/11/2022 0345   O2SAT 98 11/11/2022 0345     Coagulation Profile: Recent Labs  Lab 11/09/22 1101 11/10/22 1304  INR 1.1 2.1*    Cardiac Enzymes: No results for input(s): "CKTOTAL", "CKMB", "CKMBINDEX", "TROPONINI" in the last 168 hours.  HbA1C: Hgb A1c MFr Bld  Date/Time Value Ref Range Status  11/09/2022 11:01 AM 5.8 (H) 4.8 - 5.6 % Final    Comment:    (NOTE)         Prediabetes: 5.7 - 6.4         Diabetes: >6.4         Glycemic control for adults with diabetes: <7.0   07/09/2022 09:46 PM 6.3 (H) 4.8 - 5.6 % Final    Comment:    (NOTE)         Prediabetes: 5.7 - 6.4         Diabetes: >6.4         Glycemic control for adults with diabetes: <7.0     CBG: Recent Labs  Lab 11/11/22 0117 11/11/22 0208 11/11/22 0258 11/11/22 0358 11/11/22 0546  GLUCAP 143* 131* 120* 113* 121*    Review of Systems:   Unable to obtain as patient is intubated and sedated  Past Medical History:  He,  has a past medical history of Allergy, Anginal pain (HCC), Anxiety, CHF (congestive heart failure) (HCC), Coronary artery disease, Depression, Diabetes mellitus without complication (HCC), GERD (gastroesophageal reflux disease), Hepatitis C, HLD (hyperlipidemia) (08/09/2017), Hypertension, Hypoglycemia, Pneumonia (06/2022), and Stroke (HCC) (03/2021).   Surgical History:   Past Surgical History:  Procedure Laterality Date   APPENDECTOMY     BUBBLE STUDY  06/16/2021   Procedure: BUBBLE STUDY;  Surgeon: Sande Rives, MD;  Location: [facility] ENDOSCOPY;  Service: Cardiovascular;;    COLONOSCOPY     CORONARY ARTERY BYPASS GRAFT N/A 11/10/2022   Procedure: CORONARY ARTERY BYPASS GRAFTING (CABG) X5 USING LEFT INTERNAL MAMMARY ARTERY (LIMA) AND ENDOSCOPICALLY HARVESTED RIGHT AND LEFT GREATER SAPHENOUS VEIN;  Surgeon: Eugenio Hoes, MD;  Location: [facility] OR;  Service: Open Heart Surgery;  Laterality: N/A;   FRACTURE SURGERY Left    Ankle   LEFT HEART CATH AND CORONARY ANGIOGRAPHY N/A 07/12/2022   Procedure: LEFT HEART CATH AND CORONARY ANGIOGRAPHY;  Surgeon: Marykay Lex, MD;  Location: [facility] INVASIVE CV LAB;  Service: Cardiovascular;  Laterality: N/A;   TEE WITHOUT CARDIOVERSION N/A 06/16/2021   Procedure: TRANSESOPHAGEAL ECHOCARDIOGRAM (TEE);  Surgeon: Sande Rives, MD;  Location: [facility] ENDOSCOPY;  Service: Cardiovascular;  Laterality: N/A;   TEE WITHOUT CARDIOVERSION N/A 11/10/2022   Procedure: TRANSESOPHAGEAL ECHOCARDIOGRAM;  Surgeon: Eugenio Hoes, MD;  Location: [facility] OR;  Service: Open Heart Surgery;  Laterality: N/A;   TONSILECTOMY, ADENOIDECTOMY, BILATERAL MYRINGOTOMY AND TUBES  1955     Social History:   reports that he has quit smoking. He has never used smokeless tobacco. He reports that he does not currently use alcohol. He reports that he does not use drugs.   Family History:  His family history includes Hyperlipidemia in his father; Hypertension in his father; Lung disease in his mother.   Allergies No Known Allergies   Home Medications  Prior to Admission medications   Medication Sig Start Date End Date Taking? Authorizing Provider  atorvastatin (  LIPITOR) 80 MG tablet Take 1 tablet (80 mg total) by mouth daily. 07/14/22 11/10/22 Yes Narda Bonds, MD  Carboxymethylcellulose Sodium (THERATEARS OP) Place 1 drop into both eyes daily as needed (dry eyes).   Yes [provider]  cholecalciferol (VITAMIN D3) 25 MCG (1000 UNIT) tablet Take 1 tablet (1,000 Units total) by mouth daily. Take 1,000 Units by mouth daily. 11/22/21  Yes Corwin Levins, MD   diphenhydrAMINE (BENADRYL) 25 MG tablet Take 25 mg by mouth daily.   Yes [provider]  Evolocumab (REPATHA SURECLICK) 140 MG/ML SOAJ Inject 140 mg into the skin every 14 days. 10/06/22  Yes O'Neal, Ronnald Ramp, MD  fenofibrate 160 MG tablet Take 1 tablet (160 mg total) by mouth daily. 11/09/22  Yes Corwin Levins, MD  fluticasone [facility]) 50 MCG/ACT nasal spray Place 2 sprays into both nostrils daily. 11/09/22  Yes Corwin Levins, MD  gabapentin (NEURONTIN) 300 MG capsule Take 1 capsule (300 mg total) by mouth at bedtime. 08/08/22  Yes Plotnikov, Georgina Quint, MD  hydrALAZINE (APRESOLINE) 25 MG tablet Take 1 tablet (25 mg total) by mouth every 8 (eight) hours. 10/11/22 01/09/23 Yes Corwin Levins, MD  isosorbide mononitrate (IMDUR) 60 MG 24 hr tablet Take 0.5 tablets (30 mg total) by mouth daily. Patient taking differently: Take 60 mg by mouth daily. 11/01/22  Yes Monge, Petra Kuba, NP  losartan (COZAAR) 100 MG tablet Take 1 tablet (100 mg total) by mouth daily. 11/09/22  Yes Corwin Levins, MD  Magnesium 500 MG CAPS Take 500 mg by mouth daily.   Yes [provider]  metFORMIN (GLUCOPHAGE-XR) 500 MG 24 hr tablet Take 1 tablet (500 mg) by mouth daily with breakfast. 11/09/22  Yes Corwin Levins, MD  metoprolol tartrate (LOPRESSOR) 25 MG tablet Take 0.5 tablets (12.5 mg total) by mouth 2 (two) times daily. 07/25/22 11/22/22 Yes Eugenio Hoes, MD  Multiple Vitamins-Minerals (HAIR/SKIN/NAILS/BIOTIN PO) Take 2 tablets by mouth daily.   Yes [provider]  nitroGLYCERIN (NITROSTAT) 0.4 MG SL tablet Place 1 tablet under the tongue every 5 minutes as needed for chest pain. 10/06/22 01/04/23 Yes O'Neal, Ronnald Ramp, MD  PARoxetine (PAXIL) 40 MG tablet Take 1 tablet (40 mg total) by mouth every morning. 11/09/22  Yes Corwin Levins, MD  vitamin E 180 MG (400 UNITS) capsule Take 400 Units by mouth daily.   Yes [provider]  zolpidem (AMBIEN) 10 MG tablet TAKE 1 TABLET(10 MG) BY MOUTH AT  BEDTIME AS NEEDED FOR SLEEP Patient taking differently: Take 5-10 mg by mouth at bedtime. 09/22/22  Yes Corwin Levins, MD  clopidogrel (PLAVIX) 75 MG tablet TAKE 1 TABLET BY MOUTH DAILY 09/23/22   Corwin Levins, MD  [employer] [facility] MUSCLE CRAMPS EX) Apply 1 Application topically daily as needed (muscle cramps).    [provider]     Critical care time:     The patient is critically ill due to s/p CABG x 5, requires titration of IV antihypertensives and weaning from ventilator.  Critical care was necessary to treat or prevent imminent or life-threatening deterioration.  Critical care was time spent personally by me on the following activities: development of treatment plan with patient and/or surrogate as well as nursing, discussions with consultants, evaluation of patient's response to treatment, examination of patient, obtaining history from patient or surrogate, ordering and performing treatments and interventions, ordering and review of laboratory studies, ordering and review of radiographic studies, pulse oximetry,  re-evaluation of patient's condition and participation in multidisciplinary rounds.   During this encounter critical care time was devoted to patient care services described in this note for 35 minutes.     Cheri Fowler, MD [facility] See Amion for pager If no response to pager, please call [phone] until 7pm After 7pm, Please call E-link [phone]

## 2022-11-11 NOTE — Procedures (Signed)
Extubation Procedure Note  Patient Details:   Name: Montague Tylutki DOB: 1945-12-16 MRN: [id number]   Airway Documentation:    Vent end date: 11/11/22 Vent end time: 0820   Evaluation  O2 sats: stable throughout Complications: No apparent complications Patient did tolerate procedure well. Bilateral Breath Sounds: Clear, Diminished   Yes  Patient extubated per MD order to bipap. Positive cuff leak noted. No stridor. Bipap settings: 14/5 40%. RN at bedside.  Harmon Dun Truitt Cruey 11/11/2022, 8:23 AM

## 2022-11-11 NOTE — Progress Notes (Signed)
RT attempted 1st rapid wean trial @2041  pt was changed to SIMV rate of 4 & 40%. Flipped back to full support due to low minute ventilation and low tidal volumes @2047 . 2nd rapid wean trial attempted @2318  pt was able to make it through all ventilatory trials. Pt performed best effort NIF of -10 and VC .6L. ABG was abnormal at this time; pH and Hgb. RN made aware a blood transfusion was given pt was put back on full support. 3rd rapid wean trial attempted @0347  pt was changed to SIMV rate of 4, unable to maintain RR and tidal volumes at this time. Pt place back on full support at this time. RN made aware.

## 2022-11-11 NOTE — Progress Notes (Signed)
[address]       [place] [address]             [phone]      1 Day Post-Op  Procedure(s) (LRB): CORONARY ARTERY BYPASS GRAFTING (CABG) X5 USING LEFT INTERNAL MAMMARY ARTERY (LIMA) AND ENDOSCOPICALLY HARVESTED RIGHT AND LEFT GREATER SAPHENOUS VEIN (N/A) TRANSESOPHAGEAL ECHOCARDIOGRAM (N/A) Total Length of Stay:  LOS: 1 day    SUBJECTIVE: Bleeding resolved early yesterday Has not been able to be extubated due to low volumes on vent Requiring neo for bp support. CI good  Vitals:   11/11/22 0650 11/11/22 0700  BP:  (!) 111/59  Pulse: 89 89  Resp: (!) 30 (!) 32  Temp: 98.8 F (37.1 C) 98.8 F (37.1 C)  SpO2: 98% 97%    Intake/Output      06/27 0701 06/28 0700 06/28 0701 06/29 0700   I.V. (mL/kg) 4382.9 (49.6)    Blood 1600.2    NG/GT 120    IV Piggyback 3240.1    Total Intake(mL/kg) 9343.1 (105.7)    Urine (mL/kg/hr) 2405 (1.1)    Emesis/NG output 100    Blood 480    Chest Tube 1120    Total Output 4105    Net +5238.1             sodium chloride 20 mL/hr at 11/11/22 0615   sodium chloride     sodium chloride 10 mL/hr at 11/11/22 0615    ceFAZolin (ANCEF) IV Stopped (11/11/22 8657)   dexmedetomidine (PRECEDEX) IV infusion Stopped (11/10/22 1701)   insulin 0.8 Units/hr (11/11/22 0615)   lactated ringers     lactated ringers 20 mL/hr at 11/11/22 0615   nitroGLYCERIN Stopped (11/10/22 1803)   phenylephrine (NEO-SYNEPHRINE) Adult infusion 110 mcg/min (11/11/22 0630)    CBC    Component Value Date/Time   WBC 9.6 11/11/2022 0548   RBC 2.67 (L) 11/11/2022 0548   HGB 8.0 (L) 11/11/2022 0548   HGB 13.6 05/24/2021 0844   HCT 23.9 (L) 11/11/2022 0548   HCT 40.3 05/24/2021 0844   PLT 85 (L) 11/11/2022 0548   PLT 111 (L) 05/24/2021 0844   MCV 89.5 11/11/2022 0548   MCV 95 05/24/2021 0844   MCH 30.0 11/11/2022 0548   MCHC 33.5 11/11/2022 0548   RDW 14.9 11/11/2022 0548   RDW 12.0 05/24/2021 0844   LYMPHSABS 1.7 07/09/2022 0456    MONOABS 0.6 07/09/2022 0456   EOSABS 0.4 07/09/2022 0456   BASOSABS 0.0 07/09/2022 0456   CMP     Component Value Date/Time   NA 140 11/11/2022 0548   NA 137 05/24/2021 0844   K 4.3 11/11/2022 0548   CL 111 11/11/2022 0548   CO2 21 (L) 11/11/2022 0548   GLUCOSE 117 (H) 11/11/2022 0548   BUN 17 11/11/2022 0548   BUN 15 05/24/2021 0844   CREATININE 1.58 (H) 11/11/2022 0548   CALCIUM 7.7 (L) 11/11/2022 0548   PROT 6.7 11/09/2022 1101   PROT 7.0 08/26/2022 1106   ALBUMIN 4.2 11/09/2022 1101   ALBUMIN 4.7 08/26/2022 1106   AST 29 11/09/2022 1101   ALT 26 11/09/2022 1101   ALKPHOS 71 11/09/2022 1101   BILITOT 0.8 11/09/2022 1101   BILITOT 0.7 08/26/2022 1106   GFRNONAA 45 (L) 11/11/2022 0548   ABG    Component Value Date/Time   PHART 7.297 (L) 11/11/2022 0345   PCO2ART 40.8 11/11/2022 0345   PO2ART 115 (H) 11/11/2022 0345  HCO3 20.0 11/11/2022 0345   TCO2 21 (L) 11/11/2022 0345   ACIDBASEDEF 6.0 (H) 11/11/2022 0345   O2SAT 98 11/11/2022 0345   CBG (last 3)  Recent Labs    11/11/22 0258 11/11/22 0358 11/11/22 0546  GLUCAP 120* 113* 121*  EXAM:  Lungs; overall clear Card: RR Ext: warm Neuro: awake   ASSESSMENT: POD #1 sp CABGx5 Hemodynamics ok. Will add vaso at 0.04 hopefully able to wean after extubation. May need transfusion if Hbg drops below 8 Pulm: put to CPAP this am and anticipate extubation with bipap back up Renal: CR elevated due to surgery. Hold lasix today and follow Heme: due to expected blood loss from surgery. Transfuse if Hgb less than 8 Leave chest tubes for now   Eugenio Hoes, MD 11/11/2022

## 2022-11-11 NOTE — Progress Notes (Signed)
      [address]       [place] [address]             [phone]      Resting comfortably  BP 112/66   Pulse 89   Temp 99.3 F (37.4 C)   Resp (!) 57   Ht 5\' 11"  (1.803 m)   Wt 88.4 kg   SpO2 97%   BMI 27.18 kg/m  Vasopressin 0.04, off Neo PA 21/10, CI 2.2  Intake/Output Summary (Last 24 hours) at 11/11/2022 1747 Last data filed at 11/11/2022 1700 Gross per 24 hour  Intake 4306.5 ml  Output 1635 ml  Net 2671.5 ml   Creatinine 1.68 (from 1.58 this AM) K 4.4 Hct 27 after transfusion PLT 61K- stop enoxaparin, transfuse 1 unit PLT  Nirel Babler C. Dorris Fetch, MD [facility] [phone]

## 2022-11-11 NOTE — Progress Notes (Signed)
Patient taken off bipap and placed on 5L [place]. Vitals are stable at this time. RN at bedside.

## 2022-11-12 ENCOUNTER — Inpatient Hospital Stay (HOSPITAL_COMMUNITY): Payer: 59

## 2022-11-12 DIAGNOSIS — J9601 Acute respiratory failure with hypoxia: Secondary | ICD-10-CM

## 2022-11-12 DIAGNOSIS — I25119 Atherosclerotic heart disease of native coronary artery with unspecified angina pectoris: Secondary | ICD-10-CM | POA: Diagnosis not present

## 2022-11-12 DIAGNOSIS — J9602 Acute respiratory failure with hypercapnia: Secondary | ICD-10-CM | POA: Diagnosis not present

## 2022-11-12 LAB — CBC
HCT: 25.9 % — ABNORMAL LOW (ref 39.0–52.0)
HCT: 25 % — ABNORMAL LOW (ref 39.0–52.0)
Hemoglobin: 8.6 g/dL — ABNORMAL LOW (ref 13.0–17.0)
Hemoglobin: 8.2 g/dL — ABNORMAL LOW (ref 13.0–17.0)
MCH: 30 pg (ref 26.0–34.0)
MCH: 29.9 pg (ref 26.0–34.0)
MCHC: 33.2 g/dL (ref 30.0–36.0)
MCHC: 32.8 g/dL (ref 30.0–36.0)
MCV: 90.2 fL (ref 80.0–100.0)
MCV: 91.2 fL (ref 80.0–100.0)
Platelets: 56 10*3/uL — ABNORMAL LOW (ref 150–400)
Platelets: 68 10*3/uL — ABNORMAL LOW (ref 150–400)
RBC: 2.87 MIL/uL — ABNORMAL LOW (ref 4.22–5.81)
RBC: 2.74 MIL/uL — ABNORMAL LOW (ref 4.22–5.81)
RDW: 15.9 % — ABNORMAL HIGH (ref 11.5–15.5)
RDW: 15.8 % — ABNORMAL HIGH (ref 11.5–15.5)
WBC: 7.6 10*3/uL (ref 4.0–10.5)
WBC: 11.1 10*3/uL — ABNORMAL HIGH (ref 4.0–10.5)
nRBC: 0 % (ref 0.0–0.2)
nRBC: 0 % (ref 0.0–0.2)

## 2022-11-12 LAB — POCT I-STAT 7, (LYTES, BLD GAS, ICA,H+H)
Acid-base deficit: 2 mmol/L (ref 0.0–2.0)
Bicarbonate: 23.7 mmol/L (ref 20.0–28.0)
Calcium, Ion: 1.14 mmol/L — ABNORMAL LOW (ref 1.15–1.40)
HCT: 22 % — ABNORMAL LOW (ref 39.0–52.0)
Hemoglobin: 7.5 g/dL — ABNORMAL LOW (ref 13.0–17.0)
O2 Saturation: 95 %
Patient temperature: 37
Potassium: 4.6 mmol/L (ref 3.5–5.1)
Sodium: 140 mmol/L (ref 135–145)
TCO2: 25 mmol/L (ref 22–32)
pCO2 arterial: 42.5 mmHg (ref 32–48)
pH, Arterial: 7.355 (ref 7.35–7.45)
pO2, Arterial: 79 mmHg — ABNORMAL LOW (ref 83–108)

## 2022-11-12 LAB — BASIC METABOLIC PANEL
Anion gap: 8 (ref 5–15)
BUN: 24 mg/dL — ABNORMAL HIGH (ref 8–23)
CO2: 22 mmol/L (ref 22–32)
Calcium: 7.8 mg/dL — ABNORMAL LOW (ref 8.9–10.3)
Chloride: 109 mmol/L (ref 98–111)
Creatinine, Ser: 1.58 mg/dL — ABNORMAL HIGH (ref 0.61–1.24)
GFR, Estimated: 45 mL/min — ABNORMAL LOW (ref >60)
Glucose, Bld: 163 mg/dL — ABNORMAL HIGH (ref 70–99)
Potassium: 4.5 mmol/L (ref 3.5–5.1)
Sodium: 139 mmol/L (ref 135–145)

## 2022-11-12 LAB — GLUCOSE, CAPILLARY
Glucose-Capillary: 173 mg/dL — ABNORMAL HIGH (ref 70–99)
Glucose-Capillary: 167 mg/dL — ABNORMAL HIGH (ref 70–99)
Glucose-Capillary: 174 mg/dL — ABNORMAL HIGH (ref 70–99)
Glucose-Capillary: 154 mg/dL — ABNORMAL HIGH (ref 70–99)
Glucose-Capillary: 142 mg/dL — ABNORMAL HIGH (ref 70–99)
Glucose-Capillary: 117 mg/dL — ABNORMAL HIGH (ref 70–99)

## 2022-11-12 LAB — BPAM PLATELET PHERESIS: ISSUE DATE / TIME: 202406281758

## 2022-11-12 LAB — PREPARE PLATELET PHERESIS

## 2022-11-12 LAB — BPAM RBC

## 2022-11-12 LAB — TYPE AND SCREEN

## 2022-11-12 MED ORDER — INSULIN ASPART 100 UNIT/ML IJ SOLN
0.0000 [IU] | Freq: Three times a day (TID) | INTRAMUSCULAR | Status: DC
  Administered 2022-11-12: 3 [IU] via SUBCUTANEOUS
  Administered 2022-11-12 – 2022-11-15 (×4): 2 [IU] via SUBCUTANEOUS
  Administered 2022-11-16: 3 [IU] via SUBCUTANEOUS
  Administered 2022-11-16 – 2022-11-18 (×6): 2 [IU] via SUBCUTANEOUS
  Administered 2022-11-19: 3 [IU] via SUBCUTANEOUS
  Administered 2022-11-21 (×2): 2 [IU] via SUBCUTANEOUS

## 2022-11-12 MED ORDER — AMIODARONE HCL IN DEXTROSE 360-4.14 MG/200ML-% IV SOLN
60.0000 mg/h | INTRAVENOUS | Status: AC
  Administered 2022-11-12 (×2): 60 mg/h via INTRAVENOUS
  Filled 2022-11-12 (×2): qty 200

## 2022-11-12 MED ORDER — AMIODARONE HCL IN DEXTROSE 360-4.14 MG/200ML-% IV SOLN
30.0000 mg/h | INTRAVENOUS | Status: AC
  Administered 2022-11-12: 30 mg/h via INTRAVENOUS

## 2022-11-12 MED ORDER — AMIODARONE LOAD VIA INFUSION
150.0000 mg | Freq: Once | INTRAVENOUS | Status: AC
  Administered 2022-11-12: 150 mg via INTRAVENOUS
  Filled 2022-11-12: qty 83.34

## 2022-11-12 MED ORDER — FUROSEMIDE 10 MG/ML IJ SOLN
20.0000 mg | Freq: Once | INTRAMUSCULAR | Status: AC
  Administered 2022-11-12: 20 mg via INTRAVENOUS
  Filled 2022-11-12: qty 2

## 2022-11-12 MED ORDER — ALBUMIN HUMAN 5 % IV SOLN
12.5000 g | Freq: Once | INTRAVENOUS | Status: AC
  Administered 2022-11-12: 12.5 g via INTRAVENOUS
  Filled 2022-11-12: qty 250

## 2022-11-12 MED ORDER — AMIODARONE HCL IN DEXTROSE 360-4.14 MG/200ML-% IV SOLN
INTRAVENOUS | Status: AC
  Filled 2022-11-12: qty 200

## 2022-11-12 MED ORDER — INSULIN GLARGINE-YFGN 100 UNIT/ML ~~LOC~~ SOLN
20.0000 [IU] | Freq: Two times a day (BID) | SUBCUTANEOUS | Status: DC
  Administered 2022-11-12 – 2022-11-14 (×5): 20 [IU] via SUBCUTANEOUS
  Filled 2022-11-12 (×8): qty 0.2

## 2022-11-12 NOTE — Progress Notes (Signed)
NAME:  Collin Bridges, MRN:  [id number], DOB:  [birth year], LOS: 2 ADMISSION DATE:  11/10/2022, CONSULTATION DATE: 11/10/2022 REFERRING MD: Dr. Leafy Ro, CHIEF COMPLAINT: S/p CABG x 5  History of Present Illness:  77 year old male with multivessel coronary artery disease s/p NSTEMI last year, diabetes, hypertension, hyperlipidemia, hepatitis C and chronic HFrEF who underwent elective CABG x 5, in OR patient was noted to be wheezing as he was on Plavix, required 1 unit of PRBC.  220 of Cell Saver.  He remained intubated was transferred to ICU.  PCCM was consulted for help evaluation medical management  Pertinent  Medical History   Past Medical History:  Diagnosis Date   Allergy    seasonal   Anginal pain (HCC)    Anxiety    CHF (congestive heart failure) (HCC)    Coronary artery disease    Depression    Diabetes mellitus without complication (HCC)    Type 2   GERD (gastroesophageal reflux disease)    Pt states "take ambien for this"   Hepatitis C    HLD (hyperlipidemia) 08/09/2017   Hypertension    Hypoglycemia    Pneumonia 06/2022   Stroke (HCC) 03/2021   TIA     Significant Hospital Events: Including procedures, antibiotic start and stop dates in addition to other pertinent events     Interim History / Subjective:  Patient was successfully extubated yesterday Required BiPAP overnight  Objective   Blood pressure 113/66, pulse 89, temperature 98.6 F (37 C), resp. rate (!) 30, height 5\' 11"  (1.803 m), weight 89.6 kg, SpO2 94 %. PAP: (17-64)/(8-45) 24/10 CO:  [4.3 L/min-4.7 L/min] 4.3 L/min CI:  [2.1 L/min/m2-2.3 L/min/m2] 2.1 L/min/m2  Vent Mode: PCV;BIPAP FiO2 (%):  [40 %] 40 % Set Rate:  [18 bmp] 18 bmp PEEP:  [5 cmH20] 5 cmH20   Intake/Output Summary (Last 24 hours) at 11/12/2022 0849 Last data filed at 11/12/2022 0800 Gross per 24 hour  Intake 1797.38 ml  Output 1202 ml  Net 595.38 ml   Filed Weights   11/10/22 0548 11/11/22 0500 11/12/22 0500   Weight: 82.8 kg 88.4 kg 89.6 kg    Examination: Physical exam: General: Elderly male, lying on the bed, nasal cannula oxygen HEENT: [place]/AT, eyes anicteric.  moist mucus membranes Neuro: Awake, generalized weak, following simple commands Chest: Reduced air entry at the bases bilaterally, no wheezes or rhonchi.  Chest tube in place Heart: Regular rate and rhythm, no murmurs or gallops Abdomen: Soft, nontender, nondistended, bowel sounds present Skin: No rash  Labs and images were reviewed  Resolved Hospital Problem list     Assessment & Plan:  Coronary artery disease s/p CABG x 5 Continue aspirin and statin Chest tube output was 430 cc in last 24 hours Chest tube management TCTS Off all sedation Continue pain control with tramadol, oxycodone and morphine Closely monitor chest tube output Nitroglycerin was titrated off Remains on vasopressin, titrate with MAP goal 65  Acute hypercapnic/hypoxic respiratory failure Bilateral atelectasis Patient was excessively extubated yesterday Overnight required BiPAP, currently on 5 L nasal cannula oxygen Encourage incentive spirometry Hypercapnia has cleared  Chronic HFpEF Monitor intake and output EF 60 to 65% with diastolic dysfunction GDMT once able to tolerate  AKI, postsurgery Monitor intake and output Avoid nephrotoxic agents Repeat serum creatinine in the morning  Hypertension On hold because he is on on vasopressin  Hyperlipidemia Continue atorvastatin  Diabetes type 2 Patient hemoglobin A1c is 5.8 Insulin infusion was transitioned to sliding scale  Expected perioperative blood loss anemia Thrombocytopenia due to CPB S/p 3 unit of PRBC and 1 unit of platelet Patient's hemoglobin remained stable around 8-9 Platelet count dropped down to 56, will transfuse 1 more unit of platelets per TCTS Hold subcu Lovenox  Best Practice (right click and "Reselect all SmartList Selections" daily)   Diet/type: NPO bedside  swallow evaluation postextubation DVT prophylaxis: SCD GI prophylaxis: PPI Lines: Central line, Arterial Line, and yes and it is still needed Foley:  Yes, and it is still needed Code Status:  full code Last date of multidisciplinary goals of care discussion [Per primary team]  Labs   CBC: Recent Labs  Lab 11/10/22 1945 11/11/22 0003 11/11/22 0007 11/11/22 0345 11/11/22 0548 11/11/22 0811 11/11/22 0924 11/11/22 1037 11/11/22 1402 11/11/22 1537 11/11/22 1545 11/12/22 0450  WBC 8.8  --  9.9  --  9.6  --   --   --   --   --  9.5 7.6  HGB 7.2*   < > 6.8*   < > 8.0*   < > 7.1* 6.8* 8.5*  --  9.0* 8.6*  HCT 21.4*   < > 20.2*   < > 23.9*   < > 21.0* 20.0* 25.0*  --  27.2* 25.9*  MCV 91.8  --  91.8  --  89.5  --   --   --   --   --  89.2 90.2  PLT 91*  --  92*  --  85*  --   --   --   --  62* 61* 56*   < > = values in this interval not displayed.    Basic Metabolic Panel: Recent Labs  Lab 11/10/22 1846 11/10/22 1945 11/11/22 0003 11/11/22 0548 11/11/22 0811 11/11/22 0924 11/11/22 1037 11/11/22 1402 11/11/22 1534 11/12/22 0450  NA 137 138   < > 140   < > 140 139 140 139 139  K 4.1 4.4   < > 4.3   < > 4.6 4.5 4.7 4.4 4.5  CL 110 111  --  111  --   --   --   --  109 109  CO2 20* 20*  --  21*  --   --   --   --  22 22  GLUCOSE 118* 110*  --  117*  --   --   --   --  169* 163*  BUN 15 17  --  17  --   --   --   --  19 24*  CREATININE 1.45* 1.48*  --  1.58*  --   --   --   --  1.68* 1.58*  CALCIUM 7.6* 7.5*  --  7.7*  --   --   --   --  7.4* 7.8*  MG 3.1* 2.9*  --  2.6*  --   --   --   --  2.5*  --    < > = values in this interval not displayed.   GFR: Estimated Creatinine Clearance: 41.7 mL/min (A) (by C-G formula based on SCr of 1.58 mg/dL (H)). Recent Labs  Lab 11/11/22 0007 11/11/22 0548 11/11/22 1545 11/12/22 0450  WBC 9.9 9.6 9.5 7.6    Liver Function Tests: Recent Labs  Lab 11/09/22 1101  AST 29  ALT 26  ALKPHOS 71  BILITOT 0.8  PROT 6.7  ALBUMIN  4.2   No results for input(s): "LIPASE", "AMYLASE" in the last 168 hours. No results for input(s): "AMMONIA"  in the last 168 hours.  ABG    Component Value Date/Time   PHART 7.222 (L) 11/11/2022 1402   PCO2ART 47.6 11/11/2022 1402   PO2ART 106 11/11/2022 1402   HCO3 19.5 (L) 11/11/2022 1402   TCO2 21 (L) 11/11/2022 1402   ACIDBASEDEF 8.0 (H) 11/11/2022 1402   O2SAT 97 11/11/2022 1402     Coagulation Profile: Recent Labs  Lab 11/09/22 1101 11/10/22 1304  INR 1.1 2.1*    Cardiac Enzymes: No results for input(s): "CKTOTAL", "CKMB", "CKMBINDEX", "TROPONINI" in the last 168 hours.  HbA1C: Hgb A1c MFr Bld  Date/Time Value Ref Range Status  11/09/2022 11:01 AM 5.8 (H) 4.8 - 5.6 % Final    Comment:    (NOTE)         Prediabetes: 5.7 - 6.4         Diabetes: >6.4         Glycemic control for adults with diabetes: <7.0   07/09/2022 09:46 PM 6.3 (H) 4.8 - 5.6 % Final    Comment:    (NOTE)         Prediabetes: 5.7 - 6.4         Diabetes: >6.4         Glycemic control for adults with diabetes: <7.0     CBG: Recent Labs  Lab 11/11/22 2013 11/11/22 2321 11/12/22 0347 11/12/22 0751 11/12/22 0752  GLUCAP 164* 165* 173* 174* 167*    Review of Systems:   Unable to obtain as patient is intubated and sedated  Past Medical History:  He,  has a past medical history of Allergy, Anginal pain (HCC), Anxiety, CHF (congestive heart failure) (HCC), Coronary artery disease, Depression, Diabetes mellitus without complication (HCC), GERD (gastroesophageal reflux disease), Hepatitis C, HLD (hyperlipidemia) (08/09/2017), Hypertension, Hypoglycemia, Pneumonia (06/2022), and Stroke (HCC) (03/2021).   Surgical History:   Past Surgical History:  Procedure Laterality Date   APPENDECTOMY     BUBBLE STUDY  06/16/2021   Procedure: BUBBLE STUDY;  Surgeon: Sande Rives, MD;  Location[facility] ENDOSCOPY;  Service: Cardiovascular;;   COLONOSCOPY     CORONARY ARTERY BYPASS GRAFT N/A  11/10/2022   Procedure: CORONARY ARTERY BYPASS GRAFTING (CABG) X5 USING LEFT INTERNAL MAMMARY ARTERY (LIMA) AND ENDOSCOPICALLY HARVESTED RIGHT AND LEFT GREATER SAPHENOUS VEIN;  Surgeon: Eugenio Hoes, MD;  Location: [facility] OR;  Service: Open Heart Surgery;  Laterality: N/A;   FRACTURE SURGERY Left    Ankle   LEFT HEART CATH AND CORONARY ANGIOGRAPHY N/A 07/12/2022   Procedure: LEFT HEART CATH AND CORONARY ANGIOGRAPHY;  Surgeon: Marykay Lex, MD;  Location: [facility] INVASIVE CV LAB;  Service: Cardiovascular;  Laterality: N/A;   TEE WITHOUT CARDIOVERSION N/A 06/16/2021   Procedure: TRANSESOPHAGEAL ECHOCARDIOGRAM (TEE);  Surgeon: Sande Rives, MD;  Location: [facility] ENDOSCOPY;  Service: Cardiovascular;  Laterality: N/A;   TEE WITHOUT CARDIOVERSION N/A 11/10/2022   Procedure: TRANSESOPHAGEAL ECHOCARDIOGRAM;  Surgeon: Eugenio Hoes, MD;  Location: [facility] OR;  Service: Open Heart Surgery;  Laterality: N/A;   TONSILECTOMY, ADENOIDECTOMY, BILATERAL MYRINGOTOMY AND TUBES  1955     Social History:   reports that he has quit smoking. He has never used smokeless tobacco. He reports that he does not currently use alcohol. He reports that he does not use drugs.   Family History:  His family history includes Hyperlipidemia in his father; Hypertension in his father; Lung disease in his mother.   Allergies No Known Allergies   Home Medications  Prior to Admission medications   Medication  Sig Start Date End Date Taking? Authorizing Provider  atorvastatin (LIPITOR) 80 MG tablet Take 1 tablet (80 mg total) by mouth daily. 07/14/22 11/10/22 Yes Narda Bonds, MD  Carboxymethylcellulose Sodium (THERATEARS OP) Place 1 drop into both eyes daily as needed (dry eyes).   Yes [provider]  cholecalciferol (VITAMIN D3) 25 MCG (1000 UNIT) tablet Take 1 tablet (1,000 Units total) by mouth daily. Take 1,000 Units by mouth daily. 11/22/21  Yes Corwin Levins, MD  diphenhydrAMINE (BENADRYL) 25 MG tablet Take 25 mg by  mouth daily.   Yes [provider]  Evolocumab (REPATHA SURECLICK) 140 MG/ML SOAJ Inject 140 mg into the skin every 14 days. 10/06/22  Yes O'Neal, Ronnald Ramp, MD  fenofibrate 160 MG tablet Take 1 tablet (160 mg total) by mouth daily. 11/09/22  Yes Corwin Levins, MD  fluticasone [facility]) 50 MCG/ACT nasal spray Place 2 sprays into both nostrils daily. 11/09/22  Yes Corwin Levins, MD  gabapentin (NEURONTIN) 300 MG capsule Take 1 capsule (300 mg total) by mouth at bedtime. 08/08/22  Yes Plotnikov, Georgina Quint, MD  hydrALAZINE (APRESOLINE) 25 MG tablet Take 1 tablet (25 mg total) by mouth every 8 (eight) hours. 10/11/22 01/09/23 Yes Corwin Levins, MD  isosorbide mononitrate (IMDUR) 60 MG 24 hr tablet Take 0.5 tablets (30 mg total) by mouth daily. Patient taking differently: Take 60 mg by mouth daily. 11/01/22  Yes Monge, Petra Kuba, NP  losartan (COZAAR) 100 MG tablet Take 1 tablet (100 mg total) by mouth daily. 11/09/22  Yes Corwin Levins, MD  Magnesium 500 MG CAPS Take 500 mg by mouth daily.   Yes [provider]  metFORMIN (GLUCOPHAGE-XR) 500 MG 24 hr tablet Take 1 tablet (500 mg) by mouth daily with breakfast. 11/09/22  Yes Corwin Levins, MD  metoprolol tartrate (LOPRESSOR) 25 MG tablet Take 0.5 tablets (12.5 mg total) by mouth 2 (two) times daily. 07/25/22 11/22/22 Yes Eugenio Hoes, MD  Multiple Vitamins-Minerals (HAIR/SKIN/NAILS/BIOTIN PO) Take 2 tablets by mouth daily.   Yes [provider]  nitroGLYCERIN (NITROSTAT) 0.4 MG SL tablet Place 1 tablet under the tongue every 5 minutes as needed for chest pain. 10/06/22 01/04/23 Yes O'Neal, Ronnald Ramp, MD  PARoxetine (PAXIL) 40 MG tablet Take 1 tablet (40 mg total) by mouth every morning. 11/09/22  Yes Corwin Levins, MD  vitamin E 180 MG (400 UNITS) capsule Take 400 Units by mouth daily.   Yes [provider]  zolpidem (AMBIEN) 10 MG tablet TAKE 1 TABLET(10 MG) BY MOUTH AT BEDTIME AS NEEDED FOR SLEEP Patient taking differently:  Take 5-10 mg by mouth at bedtime. 09/22/22  Yes Corwin Levins, MD  clopidogrel (PLAVIX) 75 MG tablet TAKE 1 TABLET BY MOUTH DAILY 09/23/22   Corwin Levins, MD  Homeopathic Products [facility] MUSCLE CRAMPS EX) Apply 1 Application topically daily as needed (muscle cramps).    [provider]      Cheri Fowler, MD [facility] See Amion for pager If no response to pager, please call [phone] until 7pm After 7pm, Please call E-link [phone]

## 2022-11-12 NOTE — Evaluation (Signed)
Physical Therapy Evaluation Patient Details Name: Collin Bridges MRN: [id number] DOB: Aug 30, 1945 Today's Date: 11/12/2022  History of Present Illness  77 yo male admitted 6/27 for CABG x 5. PMHX: CAD, HTN, anxiety, BPH, HLD, CKD, CVA, T2DM, RLS  Clinical Impression  Pt pleasant and able to state 2/4 precautions beginning of session and 3/4 end of session. Pt educated for all sternal precautions, transfers, mobility and function. Pt with decreased activity tolerance, strength and independence who will benefit from acute therapy to maximize mobility and safety. RN deferred OOB at this time due to vagal episode this am. Pt may progress to HHPT but at this time Patient will benefit from continued inpatient follow up therapy, <3 hours/day  HR 99 SPO2 96% on 3L with drop to 87% with activity and need for seated rest and cues for breathing technique BP 129/70       Recommendations for follow up therapy are one component of a multi-disciplinary discharge planning process, led by the attending physician.  Recommendations may be updated based on patient status, additional functional criteria and insurance authorization.  Follow Up Recommendations Can patient physically be transported by private vehicle: No     Assistance Recommended at Discharge Frequent or constant Supervision/Assistance  Patient can return home with the following  A lot of help with walking and/or transfers;A lot of help with bathing/dressing/bathroom;Assistance with cooking/housework;Assist for transportation;Help with stairs or ramp for entrance    Equipment Recommendations Rolling walker (2 wheels);BSC/3in1  Recommendations for Other Services       Functional Status Assessment Patient has had a recent decline in their functional status and demonstrates the ability to make significant improvements in function in a reasonable and predictable amount of time.     Precautions / Restrictions Precautions Precautions:  Sternal;Fall;Other (comment) Precaution Booklet Issued: No Precaution Comments: chest tube, external pacer      Mobility  Bed Mobility Overal bed mobility: Needs Assistance Bed Mobility: Rolling, Sidelying to Sit, Sit to Supine Rolling: Mod assist Sidelying to sit: Mod assist   Sit to supine: Mod assist   General bed mobility comments: physical assist to roll to right and elevate trunk from surface with cues for sequence and precautions. Assist to lift LB back to surface with return to bed    Transfers Overall transfer level: Needs assistance   Transfers: Sit to/from Stand Sit to Stand: Min assist, +2 physical assistance, From elevated surface           General transfer comment: min +2 assist with pad and cues for hand placement to rise from bed surface x 2. Pt able to side step toward head of bed with scooting feet rather than stepping with cues for posture and weight shift. RN denied transfer OOB at this time    Ambulation/Gait                  Stairs            Wheelchair Mobility    Modified Rankin (Stroke Patients Only)       Balance Overall balance assessment: Needs assistance Sitting-balance support: No upper extremity supported, Feet supported Sitting balance-Leahy Scale: Fair     Standing balance support: Bilateral upper extremity supported, During functional activity Standing balance-Leahy Scale: Poor Standing balance comment: bil UE support by therapist or RW in standing                             Pertinent  Vitals/Pain Pain Assessment Pain Assessment: 0-10 Pain Score: 4  Pain Location: chest/incision Pain Descriptors / Indicators: Aching Pain Intervention(s): Limited activity within patient's tolerance, Monitored during session, Repositioned    Home Living Family/patient expects to be discharged to:: Private residence Living Arrangements: Alone Available Help at Discharge: Friend(s);Available PRN/intermittently Type  of Home: House Home Access: Stairs to enter   [employer] of Steps: 5   Home Layout: One level Home Equipment: Grab bars - tub/shower      Prior Function Prior Level of Function : Independent/Modified Independent                     Hand Dominance        Extremity/Trunk Assessment   Upper Extremity Assessment Upper Extremity Assessment: Generalized weakness (pt reports hx of left shoulder dislocation with limited function)    Lower Extremity Assessment Lower Extremity Assessment: Generalized weakness    Cervical / Trunk Assessment Cervical / Trunk Assessment: Kyphotic  Communication   Communication: No difficulties  Cognition Arousal/Alertness: Awake/alert Behavior During Therapy: Flat affect Overall Cognitive Status: Impaired/Different from baseline Area of Impairment: Memory                     Memory: Decreased recall of precautions         General Comments: educated for all precautions with pt able to recall 3/4 end of session        General Comments      Exercises     Assessment/Plan    PT Assessment Patient needs continued PT services  PT Problem List Decreased activity tolerance;Decreased balance;Decreased mobility;Decreased knowledge of use of DME;Decreased strength       PT Treatment Interventions DME instruction;Therapeutic exercise;Gait training;Balance training;Functional mobility training;Therapeutic activities;Stair training;Patient/family education    PT Goals (Current goals can be found in the Care Plan section)  Acute Rehab PT Goals Patient Stated Goal: return home, watch movies with my girlfriend PT Goal Formulation: With patient Time For Goal Achievement: 11/26/22 Potential to Achieve Goals: Good    Frequency Min 1X/week     Co-evaluation               AM-PAC PT "6 Clicks" Mobility  Outcome Measure Help needed turning from your back to your side while in a flat bed without using bedrails?: A  Lot Help needed moving from lying on your back to sitting on the side of a flat bed without using bedrails?: A Lot Help needed moving to and from a bed to a chair (including a wheelchair)?: A Lot Help needed standing up from a chair using your arms (e.g., wheelchair or bedside chair)?: A Lot Help needed to walk in hospital room?: Total Help needed climbing 3-5 steps with a railing? : Total 6 Click Score: 10    End of Session   Activity Tolerance: Patient tolerated treatment well Patient left: with call bell/phone within reach;in bed Nurse Communication: Mobility status PT Visit Diagnosis: Other abnormalities of gait and mobility (R26.89);Muscle weakness (generalized) (M62.81);Difficulty in walking, not elsewhere classified (R26.2)    Time: 1100-1119 PT Time Calculation (min) (ACUTE ONLY): 19 min   Charges:   PT Evaluation $PT Eval Moderate Complexity: 1 Mod          Montasia Chisenhall P, PT Acute Rehabilitation Services Office: [phone]   Enedina Finner Alfhild Partch 11/12/2022, 12:00 PM

## 2022-11-12 NOTE — Progress Notes (Signed)
2 Days Post-Op Procedure(s) (LRB): CORONARY ARTERY BYPASS GRAFTING (CABG) X5 USING LEFT INTERNAL MAMMARY ARTERY (LIMA) AND ENDOSCOPICALLY HARVESTED RIGHT AND LEFT GREATER SAPHENOUS VEIN (N/A) TRANSESOPHAGEAL ECHOCARDIOGRAM (N/A) Subjective: Required BIPAP briefly overnight Awake and responsive but minimal interaction  Objective: Vital signs in last 24 hours: Temp:  [98.6 F (37 C)-99.3 F (37.4 C)] 98.6 F (37 C) (06/29 0800) Pulse Rate:  [88-93] 89 (06/29 0800) Cardiac Rhythm: Atrial paced (06/29 0800) Resp:  [15-59] 30 (06/29 0800) BP: (98-137)/(57-100) 113/66 (06/29 0800) SpO2:  [86 %-99 %] 94 % (06/29 0800) Arterial Line BP: (91-150)/(37-61) 103/46 (06/29 0800) FiO2 (%):  [40 %] 40 % (06/28 2302) Weight:  [89.6 kg] 89.6 kg (06/29 0500)  Hemodynamic parameters for last 24 hours: PAP: (17-64)/(8-45) 24/10 CO:  [4.3 L/min-4.7 L/min] 4.3 L/min CI:  [2.1 L/min/m2-2.3 L/min/m2] 2.1 L/min/m2  Intake/Output from previous day: 06/28 0701 - 06/29 0700 In: 2073.5 [I.V.:1232.4; Blood:541; IV Piggyback:300.1] Out: 1287 [Urine:857; Chest Tube:430] Intake/Output this shift: Total I/O In: 12 [I.V.:12] Out: 100 [Urine:60; Chest Tube:40]  General appearance: cooperative and no distress Neurologic: nonfocal, psychomotor slow Heart: regular rate and rhythm Lungs: diminished breath sounds bibasilar Abdomen: normal findings: soft, non-tender  Lab Results: Recent Labs    11/11/22 1545 11/12/22 0450  WBC 9.5 7.6  HGB 9.0* 8.6*  HCT 27.2* 25.9*  PLT 61* 56*   BMET:  Recent Labs    11/11/22 1534 11/12/22 0450  NA 139 139  K 4.4 4.5  CL 109 109  CO2 22 22  GLUCOSE 169* 163*  BUN 19 24*  CREATININE 1.68* 1.58*  CALCIUM 7.4* 7.8*    PT/INR:  Recent Labs    11/10/22 1304  LABPROT 23.8*  INR 2.1*   ABG    Component Value Date/Time   PHART 7.222 (L) 11/11/2022 1402   HCO3 19.5 (L) 11/11/2022 1402   TCO2 21 (L) 11/11/2022 1402   ACIDBASEDEF 8.0 (H) 11/11/2022 1402    O2SAT 97 11/11/2022 1402   CBG (last 3)  Recent Labs    11/12/22 0347 11/12/22 0751 11/12/22 0752  GLUCAP 173* 174* 167*    Assessment/Plan: S/P Procedure(s) (LRB): CORONARY ARTERY BYPASS GRAFTING (CABG) X5 USING LEFT INTERNAL MAMMARY ARTERY (LIMA) AND ENDOSCOPICALLY HARVESTED RIGHT AND LEFT GREATER SAPHENOUS VEIN (N/A) TRANSESOPHAGEAL ECHOCARDIOGRAM (N/A) POD # 2 CABG X 5 NEURO- no focal deficits, slow, but appropriate response CV- vagal episode when he stood up earlier  Atrial paced at 90  On vasopressin- will wean as BP allows RESP- CXR with bibasilar atelectasis +/- effusion IS for atelectasis RENAL- creatinine down slightly to 1.6 from 1.7  Lytes OK  Fluid overloaded but with low filling pressures  Will give albumin/ Lasix this AM ENDO- CBG moderately elevated  Add Semglee, CBG and SSI Ac and HS GI- encourage Po intake Anemia secondary to ABL- stable, monitor Thrombocytopenia- PLT down to 56K even after transfusion yesterday  Check HIT, no heparin, enoxaparin SCD for DVT prophylaxis Deconditioning- severe- PT/OT consults   LOS: 2 days    Loreli Slot 11/12/2022

## 2022-11-12 NOTE — Progress Notes (Signed)
      [address]       [place] [address]             [phone]      Went into a fib with RVR this afternoon  Started on IV amiodarone, also received IV metoprolol  Now back in Sr in 70s  BP (!) 104/53   Pulse 76   Temp 98 F (36.7 C) (Oral)   Resp (!) 32   Ht 5\' 11"  (1.803 m)   Wt 89.6 kg   SpO2 95%   BMI 27.55 kg/m   Intake/Output Summary (Last 24 hours) at 11/12/2022 1808 Last data filed at 11/12/2022 1800 Gross per 24 hour  Intake 894.44 ml  Output 1295 ml  Net -400.56 ml   Hct 25, PLT up to 68K  Continue current Rx  Tallie Hevia C. Dorris Fetch, MD [facility] [phone]

## 2022-11-12 NOTE — Evaluation (Signed)
Clinical/Bedside Swallow Evaluation Patient Details  Name: Collin Bridges MRN: [id number] Date of Birth: Jun 30, 1945  Today's Date: 11/12/2022 Time: SLP Start Time (ACUTE ONLY): 1120 SLP Stop Time (ACUTE ONLY): 1140 SLP Time Calculation (min) (ACUTE ONLY): 20 min  Past Medical History:  Past Medical History:  Diagnosis Date   Allergy    seasonal   Anginal pain (HCC)    Anxiety    CHF (congestive heart failure) (HCC)    Coronary artery disease    Depression    Diabetes mellitus without complication (HCC)    Type 2   GERD (gastroesophageal reflux disease)    Pt states "take ambien for this"   Hepatitis C    HLD (hyperlipidemia) 08/09/2017   Hypertension    Hypoglycemia    Pneumonia 06/2022   Stroke (HCC) 03/2021   TIA   Past Surgical History:  Past Surgical History:  Procedure Laterality Date   APPENDECTOMY     BUBBLE STUDY  06/16/2021   Procedure: BUBBLE STUDY;  Surgeon: Sande Rives, MD;  Location: [facility] ENDOSCOPY;  Service: Cardiovascular;;   COLONOSCOPY     CORONARY ARTERY BYPASS GRAFT N/A 11/10/2022   Procedure: CORONARY ARTERY BYPASS GRAFTING (CABG) X5 USING LEFT INTERNAL MAMMARY ARTERY (LIMA) AND ENDOSCOPICALLY HARVESTED RIGHT AND LEFT GREATER SAPHENOUS VEIN;  Surgeon: Eugenio Hoes, MD;  Location: [facility] OR;  Service: Open Heart Surgery;  Laterality: N/A;   FRACTURE SURGERY Left    Ankle   LEFT HEART CATH AND CORONARY ANGIOGRAPHY N/A 07/12/2022   Procedure: LEFT HEART CATH AND CORONARY ANGIOGRAPHY;  Surgeon: Marykay Lex, MD;  Location: [facility] INVASIVE CV LAB;  Service: Cardiovascular;  Laterality: N/A;   TEE WITHOUT CARDIOVERSION N/A 06/16/2021   Procedure: TRANSESOPHAGEAL ECHOCARDIOGRAM (TEE);  Surgeon: Sande Rives, MD;  Location: [facility] ENDOSCOPY;  Service: Cardiovascular;  Laterality: N/A;   TEE WITHOUT CARDIOVERSION N/A 11/10/2022   Procedure: TRANSESOPHAGEAL ECHOCARDIOGRAM;  Surgeon: Eugenio Hoes, MD;  Location: [facility] OR;  Service: Open Heart  Surgery;  Laterality: N/A;   TONSILECTOMY, ADENOIDECTOMY, BILATERAL MYRINGOTOMY AND TUBES  1955   HPI:  Patient is a 77 y.o. male with PMH: CAD, HTN, anxiety, BPH, HLD, CKD, CVA, DM-2, RLS. He presented to the hospital on 11/10/22 for CABG x5. He was intubated 6/27 for the procedure and extubated the following day, 6/28 at 0820. RN reported that patient had coughing during and after taking pills in pudding and he expectorated phlegm but also a whole pill.    Assessment / Plan / Recommendation  Clinical Impression  Patient presents with clinical s/s of dysphagia as per this bedside swallow evaluation. His diet order prior to this evaluation was regular texture solids, thin liquids, however his RN ordered him a clear liquids tray as she was apprehensive about giving him solids based on observed difficulties he had this morning with his meds in pudding. Patient was awake, alert, appears very weak and fatigued. He reported that he feels better having since "hacked up" phlegm earlier today. His voice is very weak, low in intensity but he was able to verbally communicate well. SLP provided setup and and prep of food items on tray but patient able to feed himself. He drank thin liquids (broth, juice, water) and had some of an [place] ice. SLP observed patient to swallow 1-2 extra times with initial sips of liquids but swallow initiation appeared timely. He exhibited two instances of mild sounding throat clearing but no coughing, no change in vitals or voice. SpO2 remained in range  of 89-91 and RR in range of 26-30. SLP recommending downgrade to clear liquids (thin) and meds as tolerated. SLP will follow patient for ability to advance to solid texture PO's. SLP Visit Diagnosis: Dysphagia, unspecified (R13.10)    Aspiration Risk  Mild aspiration risk    Diet Recommendation Thin liquid    Liquid Administration via: Cup (as tolerated) Medication Administration: Other (Comment) (as tolerated) Supervision:  Patient able to self feed;Full supervision/cueing for compensatory strategies;Staff to assist with self feeding Compensations: Slow rate;Small sips/bites Postural Changes: Seated upright at 90 degrees    Other  Recommendations Oral Care Recommendations: Oral care BID;Staff/trained caregiver to provide oral care    Recommendations for follow up therapy are one component of a multi-disciplinary discharge planning process, led by the attending physician.  Recommendations may be updated based on patient status, additional functional criteria and insurance authorization.  Follow up Recommendations Other (comment) (TBD)      Assistance Recommended at Discharge    Functional Status Assessment Patient has had a recent decline in their functional status and demonstrates the ability to make significant improvements in function in a reasonable and predictable amount of time.  Frequency and Duration min 2x/week  1 week       Prognosis Prognosis for improved oropharyngeal function: Good      Swallow Study   General Date of Onset: 11/11/22 HPI: Patient is a 77 y.o. male with PMH: CAD, HTN, anxiety, BPH, HLD, CKD, CVA, DM-2, RLS. He presented to the hospital on 11/10/22 for CABG x5. He was intubated 6/27 for the procedure and extubated the following day, 6/28 at 0820. RN reported that patient had coughing during and after taking pills in pudding and he expectorated phlegm but also a whole pill. Type of Study: Bedside Swallow Evaluation Previous Swallow Assessment: none found Diet Prior to this Study: Regular;Thin liquids (Level 0) Temperature Spikes Noted: No Respiratory Status: Nasal cannula History of Recent Intubation: Yes Total duration of intubation (days): 2 days Date extubated: 11/11/22 Behavior/Cognition: Alert;Cooperative;Pleasant mood;Lethargic/Drowsy Oral Cavity Assessment: Within Functional Limits Oral Care Completed by SLP: Recent completion by staff Oral Cavity - Dentition:  Adequate natural dentition Self-Feeding Abilities: Needs set up;Needs assist;Able to feed self Patient Positioning: Upright in bed Baseline Vocal Quality: Low vocal intensity Volitional Cough: Weak Volitional Swallow: Able to elicit    Oral/Motor/Sensory Function Overall Oral Motor/Sensory Function: Generalized oral weakness   Ice Chips     Thin Liquid Thin Liquid: Impaired Pharyngeal  Phase Impairments: Throat Clearing - Delayed;Multiple swallows    Nectar Thick     Honey Thick     Puree Puree: Not tested   Solid     Solid: Not tested      Angela Nevin, MA, CCC-SLP Speech Therapy

## 2022-11-13 ENCOUNTER — Inpatient Hospital Stay (HOSPITAL_COMMUNITY): Payer: 59

## 2022-11-13 DIAGNOSIS — J9602 Acute respiratory failure with hypercapnia: Secondary | ICD-10-CM | POA: Diagnosis not present

## 2022-11-13 DIAGNOSIS — J9601 Acute respiratory failure with hypoxia: Secondary | ICD-10-CM | POA: Diagnosis not present

## 2022-11-13 DIAGNOSIS — I25119 Atherosclerotic heart disease of native coronary artery with unspecified angina pectoris: Secondary | ICD-10-CM | POA: Diagnosis not present

## 2022-11-13 LAB — CBC
HCT: 23.1 % — ABNORMAL LOW (ref 39.0–52.0)
HCT: 27.3 % — ABNORMAL LOW (ref 39.0–52.0)
Hemoglobin: 7.7 g/dL — ABNORMAL LOW (ref 13.0–17.0)
Hemoglobin: 8.9 g/dL — ABNORMAL LOW (ref 13.0–17.0)
MCH: 31.2 pg (ref 26.0–34.0)
MCH: 29.9 pg (ref 26.0–34.0)
MCHC: 33.3 g/dL (ref 30.0–36.0)
MCHC: 32.6 g/dL (ref 30.0–36.0)
MCV: 93.5 fL (ref 80.0–100.0)
MCV: 91.6 fL (ref 80.0–100.0)
Platelets: 56 10*3/uL — ABNORMAL LOW (ref 150–400)
Platelets: 66 10*3/uL — ABNORMAL LOW (ref 150–400)
RBC: 2.47 MIL/uL — ABNORMAL LOW (ref 4.22–5.81)
RBC: 2.98 MIL/uL — ABNORMAL LOW (ref 4.22–5.81)
RDW: 15.6 % — ABNORMAL HIGH (ref 11.5–15.5)
RDW: 15.7 % — ABNORMAL HIGH (ref 11.5–15.5)
WBC: 7.3 10*3/uL (ref 4.0–10.5)
WBC: 8.7 10*3/uL (ref 4.0–10.5)
nRBC: 0.3 % — ABNORMAL HIGH (ref 0.0–0.2)
nRBC: 0 % (ref 0.0–0.2)

## 2022-11-13 LAB — GLUCOSE, CAPILLARY
Glucose-Capillary: 113 mg/dL — ABNORMAL HIGH (ref 70–99)
Glucose-Capillary: 114 mg/dL — ABNORMAL HIGH (ref 70–99)
Glucose-Capillary: 112 mg/dL — ABNORMAL HIGH (ref 70–99)
Glucose-Capillary: 110 mg/dL — ABNORMAL HIGH (ref 70–99)

## 2022-11-13 LAB — TYPE AND SCREEN
ABO/RH(D): B NEG
Antibody Screen: NEGATIVE
Unit division: 0
Unit division: 0

## 2022-11-13 LAB — BASIC METABOLIC PANEL
Anion gap: 6 (ref 5–15)
BUN: 33 mg/dL — ABNORMAL HIGH (ref 8–23)
CO2: 25 mmol/L (ref 22–32)
Calcium: 7.9 mg/dL — ABNORMAL LOW (ref 8.9–10.3)
Chloride: 107 mmol/L (ref 98–111)
Creatinine, Ser: 1.77 mg/dL — ABNORMAL HIGH (ref 0.61–1.24)
GFR, Estimated: 39 mL/min — ABNORMAL LOW (ref >60)
Glucose, Bld: 130 mg/dL — ABNORMAL HIGH (ref 70–99)
Potassium: 3.7 mmol/L (ref 3.5–5.1)
Sodium: 138 mmol/L (ref 135–145)

## 2022-11-13 LAB — PREPARE RBC (CROSSMATCH)

## 2022-11-13 LAB — BPAM RBC
Blood Product Expiration Date: 202407232359
Blood Product Expiration Date: 202407282359
Blood Product Expiration Date: 202407312359
Unit Type and Rh: 1700

## 2022-11-13 LAB — HEPARIN INDUCED PLATELET AB (HIT ANTIBODY): Heparin Induced Plt Ab: 0.058 OD (ref 0.000–0.400)

## 2022-11-13 MED ORDER — SODIUM CHLORIDE 0.9% IV SOLUTION: Freq: Once | INTRAVENOUS | Status: AC

## 2022-11-13 MED ORDER — FUROSEMIDE 10 MG/ML IJ SOLN
40.0000 mg | Freq: Once | INTRAMUSCULAR | Status: AC
  Administered 2022-11-13: 40 mg via INTRAVENOUS
  Filled 2022-11-13: qty 4

## 2022-11-13 MED ORDER — ORAL CARE MOUTH RINSE: 15.0000 mL | OROMUCOSAL | Status: DC | PRN

## 2022-11-13 MED ORDER — POTASSIUM CHLORIDE CRYS ER 20 MEQ PO TBCR
20.0000 meq | EXTENDED_RELEASE_TABLET | ORAL | Status: AC
  Administered 2022-11-13 (×3): 20 meq via ORAL
  Filled 2022-11-13 (×3): qty 1

## 2022-11-13 MED ORDER — AMIODARONE HCL 200 MG PO TABS
400.0000 mg | ORAL_TABLET | Freq: Two times a day (BID) | ORAL | Status: DC
  Administered 2022-11-13 – 2022-11-14 (×4): 400 mg via ORAL
  Filled 2022-11-13 (×4): qty 2

## 2022-11-13 NOTE — Progress Notes (Signed)
      [address]       [place] [address]             [phone]      Up in chair, eating dinner  BP (!) 126/96   Pulse 90   Temp 98.9 F (37.2 C) (Oral)   Resp (!) 23   Ht 5\' 11"  (1.803 m)   Wt 89.7 kg   SpO2 95%   BMI 27.58 kg/m   Intake/Output Summary (Last 24 hours) at 11/13/2022 1745 Last data filed at 11/13/2022 1600 Gross per 24 hour  Intake 691.68 ml  Output 837 ml  Net -145.32 ml   Transfused 1 unit PRBC this AM  Looks better this evening  Viviann Spare C. Dorris Fetch, MD [facility] [phone]

## 2022-11-13 NOTE — Progress Notes (Signed)
Speech Language Pathology Treatment: Dysphagia  Patient Details Name: Collin Bridges MRN: [id number] DOB: 1945-09-05 Today's Date: 11/13/2022 Time: 5638-7564 SLP Time Calculation (min) (ACUTE ONLY): 12 min  Assessment / Plan / Recommendation Clinical Impression  Patient seen for diet tolerance and potential Bridges advance. He was alert and cooperative. Affect remains flat. Tongue noted Bridges deviate slightly Bridges the left (mentioned to RN, question baseline). No other focal left sided deficits noted. Vocal quality with low intensity but hoarseness improving. He was able Bridges self feed under skilled observation trials of thin liquid via straw and cup (preference), pureed solids and dysphagia 3 solids without overt s/s of aspiration. Mastication prolonged but functional with upgraded solids due Bridges missing dentition although agreeable Bridges upgrade Bridges dysphagia 2 solids at this time. Girlfriend present, aware, and in agreement. RN educated. SLP will f/u for diet tolerance and further advancement with partials which significant other will bring.    HPI HPI: Patient is a 77 y.o. male with PMH: CAD, HTN, anxiety, BPH, HLD, CKD, CVA, DM-2, RLS. He presented Bridges the hospital on 11/10/22 for CABG x5. He was intubated 6/27 for the procedure and extubated the following day, 6/28 at 0820. RN reported that patient had coughing during and after taking pills in pudding and he expectorated phlegm but also a whole pill.      SLP Plan  Continue with current plan of care      Recommendations for follow up therapy are one component of a multi-disciplinary discharge planning process, led by the attending physician.  Recommendations may be updated based on patient status, additional functional criteria and insurance authorization.    Recommendations  Diet recommendations: Dysphagia 2 (fine chop);Thin liquid Liquids provided via: Cup;Straw Medication Administration: Whole meds with puree (per patient  preference) Supervision: Patient able Bridges self feed Compensations: Slow rate;Small sips/bites Postural Changes and/or Swallow Maneuvers: Seated upright 90 degrees                  Oral care BID   None Dysphagia, unspecified (R13.10)     Continue with current plan of care    [facility] MA, CCC-SLP  Krithika Tome Meryl  11/13/2022, 11:44 AM

## 2022-11-13 NOTE — Progress Notes (Signed)
3 Days Post-Op Procedure(s) (LRB): CORONARY ARTERY BYPASS GRAFTING (CABG) X5 USING LEFT INTERNAL MAMMARY ARTERY (LIMA) AND ENDOSCOPICALLY HARVESTED RIGHT AND LEFT GREATER SAPHENOUS VEIN (N/A) TRANSESOPHAGEAL ECHOCARDIOGRAM (N/A) Subjective: Up in chair with assistance Eating a little bit.  Denies pain, feels weak  Objective: Vital signs in last 24 hours: Temp:  [97.7 F (36.5 C)-100 F (37.8 C)] 100 F (37.8 C) (06/30 0705) Pulse Rate:  [60-156] 70 (06/30 0730) Cardiac Rhythm: Normal sinus rhythm (06/30 0400) Resp:  [19-61] 21 (06/30 0730) BP: (91-132)/(50-88) 119/53 (06/30 0730) SpO2:  [88 %-98 %] 96 % (06/30 0730) Arterial Line BP: (64-150)/(38-139) 150/139 (06/29 2000) Weight:  [89.7 kg] 89.7 kg (06/30 0500)  Hemodynamic parameters for last 24 hours: PAP: (20-31)/(8-14) 29/14  Intake/Output from previous day: 06/29 0701 - 06/30 0700 In: 511.7 [I.V.:444.7; IV Piggyback:67] Out: 1000 [Urine:910; Chest Tube:90] Intake/Output this shift: No intake/output data recorded.  General appearance: alert, cooperative, and no distress Neurologic: no focal motor deficit, generalized weakness Heart: regular rate and rhythm Lungs: diminished breath sounds bibasilar Abdomen: normal findings: soft, non-tender  Lab Results: Recent Labs    11/12/22 1618 11/13/22 0549  WBC 11.1* 7.3  HGB 8.2* 7.7*  HCT 25.0* 23.1*  PLT 68* 56*   BMET:  Recent Labs    11/12/22 0450 11/12/22 0908 11/13/22 0549  NA 139 140 138  K 4.5 4.6 3.7  CL 109  --  107  CO2 22  --  25  GLUCOSE 163*  --  130*  BUN 24*  --  33*  CREATININE 1.58*  --  1.77*  CALCIUM 7.8*  --  7.9*    PT/INR:  Recent Labs    11/10/22 1304  LABPROT 23.8*  INR 2.1*   ABG    Component Value Date/Time   PHART 7.355 11/12/2022 0908   HCO3 23.7 11/12/2022 0908   TCO2 25 11/12/2022 0908   ACIDBASEDEF 2.0 11/12/2022 0908   O2SAT 95 11/12/2022 0908   CBG (last 3)  Recent Labs    11/12/22 1600 11/12/22 2109  11/13/22 0622  GLUCAP 142* 117* 113*    Assessment/Plan: S/P Procedure(s) (LRB): CORONARY ARTERY BYPASS GRAFTING (CABG) X5 USING LEFT INTERNAL MAMMARY ARTERY (LIMA) AND ENDOSCOPICALLY HARVESTED RIGHT AND LEFT GREATER SAPHENOUS VEIN (N/A) TRANSESOPHAGEAL ECHOCARDIOGRAM (N/A) POD # 3 NEURO- more alert CV- in SR after AF with RVR yesterday evening  On amiodarone drip - transition to PO  ASA, statin, beta blocker RESP- bibasilar atelectasis +/- effusions on CXR  IS, diurese RENAL- creatinine up slightly to 1.77, lytes OK Gi- advance diet as tolerated Anemia secondary to ABL- will transfuse 1 unit PRBC Thrombocytopenia- PLT down slightly, no bleeding  No heparin, monitor Deconditioning- severe - PT/OT    LOS: 3 days    Loreli Slot 11/13/2022

## 2022-11-13 NOTE — Progress Notes (Signed)
NAME:  Collin Bridges, MRN:  [id number], DOB:  [birth year], LOS: 3 ADMISSION DATE:  11/10/2022, CONSULTATION DATE: 11/10/2022 REFERRING MD: Dr. Leafy Ro, CHIEF COMPLAINT: S/p CABG x 5  History of Present Illness:  77 year old male with multivessel coronary artery disease s/p NSTEMI last year, diabetes, hypertension, hyperlipidemia, hepatitis C and chronic HFrEF who underwent elective CABG x 5, in OR patient was noted to be wheezing as he was on Plavix, required 1 unit of PRBC.  220 of Cell Saver.  He remained intubated was transferred to ICU.  PCCM was consulted for help evaluation medical management  Pertinent  Medical History   Past Medical History:  Diagnosis Date   Allergy    seasonal   Anginal pain (HCC)    Anxiety    CHF (congestive heart failure) (HCC)    Coronary artery disease    Depression    Diabetes mellitus without complication (HCC)    Type 2   GERD (gastroesophageal reflux disease)    Pt states "take ambien for this"   Hepatitis C    HLD (hyperlipidemia) 08/09/2017   Hypertension    Hypoglycemia    Pneumonia 06/2022   Stroke (HCC) 03/2021   TIA     Significant Hospital Events: Including procedures, antibiotic start and stop dates in addition to other pertinent events     Interim History / Subjective:  Patient remained generalized weak Went into A-fib with RVR, was given amiodarone bolus followed by amiodarone infusion, converted to sinus rhythm overnight Remains on 2 L nasal cannula oxygen Received 1 unit of platelet transfusion yesterday  Objective   Blood pressure (!) 119/53, pulse 70, temperature 100 F (37.8 C), temperature source Oral, resp. rate (!) 21, height 5\' 11"  (1.803 m), weight 89.7 kg, SpO2 96 %. PAP: (20-31)/(8-14) 29/14      Intake/Output Summary (Last 24 hours) at 11/13/2022 0754 Last data filed at 11/13/2022 0600 Gross per 24 hour  Intake 511.65 ml  Output 1000 ml  Net -488.35 ml   Filed Weights   11/11/22 0500 11/12/22 0500  11/13/22 0500  Weight: 88.4 kg 89.6 kg 89.7 kg    Examination: Physical exam: General: Elderly male, sitting on recliner, on nasal cannula oxygen HEENT: [place]/AT, eyes anicteric.  moist mucus membranes Neuro: Alert, awake following commands, generalized weak Chest: Reduced air entry at the bases bilaterally, no wheezes or rhonchi Heart: Regular rate and rhythm, no murmurs or gallops Abdomen: Soft, nontender, nondistended, bowel sounds present Skin: No rash  Labs and images were reviewed  Resolved Hospital Problem list     Assessment & Plan:  Coronary artery disease s/p CABG x 5 Paroxysmal A-fib Continue aspirin and statin Continue metoprolol and amiodarone Chest tube output was 90 cc in last 24 hours Chest tube management TCTS Continue pain control with tramadol, oxycodone and morphine Off vasopressors  Acute hypercapnic/hypoxic respiratory failure Bilateral atelectasis and small pleural effusion Encourage incentive spirometry and flutter valve On 2 L nasal cannula oxygen, titrate with O2 sat goal 92% Encourage ambulation  Chronic HFpEF Monitor intake and output EF 60 to 65% with diastolic dysfunction GDMT once able to tolerate  AKI, postsurgery Serum creatinine has trended up to 1.77 Avoid nephrotoxic agents Will be receiving diuretic today  Hypertension Continue metoprolol  Hyperlipidemia Continue atorvastatin  Diabetes type 2 Patient hemoglobin A1c is 5.8 Continue sliding scale insulin and Semglee with CBG goal 140-180  Expected perioperative blood loss anemia Thrombocytopenia due to CPB S/p 3 unit of PRBC and 2 unit of  platelet Hemoglobin dropped down to 7.7, he is getting 1 unit PRBC transfusion per TCTS Monitor H&H and platelet count Hold subcu Lovenox  Best Practice (right click and "Reselect all SmartList Selections" daily)   Diet/type: Advance as tolerated DVT prophylaxis: SCD GI prophylaxis: PPI Lines: Central line, Arterial Line, and yes and  it is still needed Foley:  Yes, and it is still needed Code Status:  full code Last date of multidisciplinary goals of care discussion [Per primary team]  Labs   CBC: Recent Labs  Lab 11/11/22 0548 11/11/22 0811 11/11/22 1537 11/11/22 1545 11/12/22 0450 11/12/22 0908 11/12/22 1618 11/13/22 0549  WBC 9.6  --   --  9.5 7.6  --  11.1* 7.3  HGB 8.0*   < >  --  9.0* 8.6* 7.5* 8.2* 7.7*  HCT 23.9*   < >  --  27.2* 25.9* 22.0* 25.0* 23.1*  MCV 89.5  --   --  89.2 90.2  --  91.2 93.5  PLT 85*  --  62* 61* 56*  --  68* 56*   < > = values in this interval not displayed.    Basic Metabolic Panel: Recent Labs  Lab 11/10/22 1846 11/10/22 1945 11/11/22 0003 11/11/22 0548 11/11/22 1610 11/11/22 1402 11/11/22 1534 11/12/22 0450 11/12/22 0908 11/13/22 0549  NA 137 138   < > 140   < > 140 139 139 140 138  K 4.1 4.4   < > 4.3   < > 4.7 4.4 4.5 4.6 3.7  CL 110 111  --  111  --   --  109 109  --  107  CO2 20* 20*  --  21*  --   --  22 22  --  25  GLUCOSE 118* 110*  --  117*  --   --  169* 163*  --  130*  BUN 15 17  --  17  --   --  19 24*  --  33*  CREATININE 1.45* 1.48*  --  1.58*  --   --  1.68* 1.58*  --  1.77*  CALCIUM 7.6* 7.5*  --  7.7*  --   --  7.4* 7.8*  --  7.9*  MG 3.1* 2.9*  --  2.6*  --   --  2.5*  --   --   --    < > = values in this interval not displayed.   GFR: Estimated Creatinine Clearance: 37.2 mL/min (A) (by C-G formula based on SCr of 1.77 mg/dL (H)). Recent Labs  Lab 11/11/22 1545 11/12/22 0450 11/12/22 1618 11/13/22 0549  WBC 9.5 7.6 11.1* 7.3    Liver Function Tests: Recent Labs  Lab 11/09/22 1101  AST 29  ALT 26  ALKPHOS 71  BILITOT 0.8  PROT 6.7  ALBUMIN 4.2   No results for input(s): "LIPASE", "AMYLASE" in the last 168 hours. No results for input(s): "AMMONIA" in the last 168 hours.  ABG    Component Value Date/Time   PHART 7.355 11/12/2022 0908   PCO2ART 42.5 11/12/2022 0908   PO2ART 79 (L) 11/12/2022 0908   HCO3 23.7 11/12/2022  0908   TCO2 25 11/12/2022 0908   ACIDBASEDEF 2.0 11/12/2022 0908   O2SAT 95 11/12/2022 0908     Coagulation Profile: Recent Labs  Lab 11/09/22 1101 11/10/22 1304  INR 1.1 2.1*    Cardiac Enzymes: No results for input(s): "CKTOTAL", "CKMB", "CKMBINDEX", "TROPONINI" in the last 168 hours.  HbA1C: Hgb A1c MFr Bld  Date/Time Value Ref Range Status  11/09/2022 11:01 AM 5.8 (H) 4.8 - 5.6 % Final    Comment:    (NOTE)         Prediabetes: 5.7 - 6.4         Diabetes: >6.4         Glycemic control for adults with diabetes: <7.0   07/09/2022 09:46 PM 6.3 (H) 4.8 - 5.6 % Final    Comment:    (NOTE)         Prediabetes: 5.7 - 6.4         Diabetes: >6.4         Glycemic control for adults with diabetes: <7.0     CBG: Recent Labs  Lab 11/12/22 0752 11/12/22 1120 11/12/22 1600 11/12/22 2109 11/13/22 0622  GLUCAP 167* 154* 142* 117* 113*   Cheri Fowler, MD [facility] See Amion for pager If no response to pager, please call [phone] until 7pm After 7pm, Please call E-link [phone]

## 2022-11-13 NOTE — Progress Notes (Signed)
Physical Therapy Treatment Patient Details Name: Collin Bridges MRN: [id number] DOB: 02/06/1946 Today's Date: 11/13/2022   History of Present Illness 77 yo male admitted 6/27 for CABG x 5. PMHX: CAD, HTN, anxiety, BPH, HLD, CKD, CVA, T2DM, RLS    PT Comments    Pt pleasant with flat affect stating fatigue from being in chair. Pt able to progress to limited gait but requires assist with all transfers and progression with flexed trunk and limited ability with stepping. Pt on 3-5L with activity with poor pleth for SPO2 during gait, 97% on 3L at rest. Pt educated for all precautions with ability to recall 2/4. Will continue to follow with plan appropriate.   Hr 73-85 Pre gait 124/88 Post gait 142/71    Recommendations for follow up therapy are one component of a multi-disciplinary discharge planning process, led by the attending physician.  Recommendations may be updated based on patient status, additional functional criteria and insurance authorization.  Follow Up Recommendations  Can patient physically be transported by private vehicle: No    Assistance Recommended at Discharge Frequent or constant Supervision/Assistance  Patient can return home with the following A lot of help with walking and/or transfers;A lot of help with bathing/dressing/bathroom;Assistance with cooking/housework;Assist for transportation;Help with stairs or ramp for entrance   Equipment Recommendations  Rolling walker (2 wheels);BSC/3in1    Recommendations for Other Services       Precautions / Restrictions Precautions Precautions: Sternal;Fall;Other (comment) Precaution Comments: external pacer, watch SPO2 Restrictions Weight Bearing Restrictions: Yes RUE Weight Bearing: Non weight bearing LUE Weight Bearing: Non weight bearing     Mobility  Bed Mobility Overal bed mobility: Needs Assistance Bed Mobility: Sit to Supine       Sit to supine: Mod assist   General bed mobility comments: mod  assist to lift legs to surface and position in midline    Transfers Overall transfer level: Needs assistance   Transfers: Sit to/from Stand Sit to Stand: Mod assist           General transfer comment: mod cues for hands on thighs with assist to lift sacrum from chair, increased time and cues to achieve hip and trunk extension in standing    Ambulation/Gait Ambulation/Gait assistance: Mod assist, +2 safety/equipment Gait Distance (Feet): 38 Feet Assistive device: Rolling walker (2 wheels) Gait Pattern/deviations: Step-through pattern, Decreased stride length, Narrow base of support, Trunk flexed   Gait velocity interpretation: <1.8 ft/sec, indicate of risk for recurrent falls   General Gait Details: pt with physical assist to direct and advance RW, mod cues for posture and proximity to RW, short shuffling strides with cues for increased BOS and step length, limited by fatigue   Stairs             Wheelchair Mobility    Modified Rankin (Stroke Patients Only)       Balance Overall balance assessment: Needs assistance Sitting-balance support: No upper extremity supported, Feet supported Sitting balance-Leahy Scale: Fair     Standing balance support: Bilateral upper extremity supported, During functional activity Standing balance-Leahy Scale: Poor Standing balance comment: bil UE on RW in standing                            Cognition Arousal/Alertness: Awake/alert Behavior During Therapy: Flat affect Overall Cognitive Status: Impaired/Different from baseline Area of Impairment: Memory, Problem solving  Memory: Decreased recall of precautions       Problem Solving: Slow processing General Comments: educated for all precautions with pt able to recall 2/4 end of session        Exercises      General Comments        Pertinent Vitals/Pain Pain Assessment Pain Assessment: No/denies pain    Home Living                           Prior Function            PT Goals (current goals can now be found in the care plan section) Progress towards PT goals: Progressing toward goals    Frequency    Min 1X/week      PT Plan Current plan remains appropriate    Co-evaluation              AM-PAC PT "6 Clicks" Mobility   Outcome Measure  Help needed turning from your back to your side while in a flat bed without using bedrails?: A Lot Help needed moving from lying on your back to sitting on the side of a flat bed without using bedrails?: A Lot Help needed moving to and from a bed to a chair (including a wheelchair)?: A Lot Help needed standing up from a chair using your arms (e.g., wheelchair or bedside chair)?: A Lot Help needed to walk in hospital room?: A Lot Help needed climbing 3-5 steps with a railing? : Total 6 Click Score: 11    End of Session Equipment Utilized During Treatment: Gait belt;Oxygen Activity Tolerance: Patient tolerated treatment well Patient left: in bed;with call bell/phone within reach;with nursing/sitter in room Nurse Communication: Mobility status PT Visit Diagnosis: Other abnormalities of gait and mobility (R26.89);Muscle weakness (generalized) (M62.81);Difficulty in walking, not elsewhere classified (R26.2)     Time: 9604-5409 PT Time Calculation (min) (ACUTE ONLY): 23 min  Charges:  $Gait Training: 8-22 mins $Therapeutic Activity: 8-22 mins                     Merryl Hacker, PT Acute Rehabilitation Services Office: [phone]    Cristine Polio 11/13/2022, 10:42 AM

## 2022-11-13 NOTE — Progress Notes (Signed)
Pt has PRN Bipap , no distress noted at this time.  

## 2022-11-14 ENCOUNTER — Inpatient Hospital Stay (HOSPITAL_COMMUNITY): Payer: 59

## 2022-11-14 ENCOUNTER — Encounter (HOSPITAL_COMMUNITY): Payer: Self-pay | Admitting: Thoracic Surgery (Cardiothoracic Vascular Surgery)

## 2022-11-14 LAB — ECHO INTRAOPERATIVE TEE
AR max vel: 2.12 cm2
AV Area VTI: 1.94 cm2
AV Area mean vel: 2.04 cm2
AV Mean grad: 10 mmHg
AV Peak grad: 17 mmHg
Ao pk vel: 2.06 m/s
Height: 71 in
Weight: 2919.98 oz

## 2022-11-14 LAB — CBC
HCT: 26.1 % — ABNORMAL LOW (ref 39.0–52.0)
Hemoglobin: 8.6 g/dL — ABNORMAL LOW (ref 13.0–17.0)
MCH: 30.2 pg (ref 26.0–34.0)
MCHC: 33 g/dL (ref 30.0–36.0)
MCV: 91.6 fL (ref 80.0–100.0)
Platelets: 71 10*3/uL — ABNORMAL LOW (ref 150–400)
RBC: 2.85 MIL/uL — ABNORMAL LOW (ref 4.22–5.81)
RDW: 15.6 % — ABNORMAL HIGH (ref 11.5–15.5)
WBC: 5.9 10*3/uL (ref 4.0–10.5)
nRBC: 0 % (ref 0.0–0.2)

## 2022-11-14 LAB — TYPE AND SCREEN
Unit division: 0
Unit division: 0

## 2022-11-14 LAB — BASIC METABOLIC PANEL
Anion gap: 7 (ref 5–15)
BUN: 30 mg/dL — ABNORMAL HIGH (ref 8–23)
CO2: 25 mmol/L (ref 22–32)
Calcium: 7.9 mg/dL — ABNORMAL LOW (ref 8.9–10.3)
Chloride: 109 mmol/L (ref 98–111)
Creatinine, Ser: 1.49 mg/dL — ABNORMAL HIGH (ref 0.61–1.24)
GFR, Estimated: 48 mL/min — ABNORMAL LOW (ref >60)
Glucose, Bld: 110 mg/dL — ABNORMAL HIGH (ref 70–99)
Potassium: 3.6 mmol/L (ref 3.5–5.1)
Sodium: 141 mmol/L (ref 135–145)

## 2022-11-14 LAB — GLUCOSE, CAPILLARY
Glucose-Capillary: 101 mg/dL — ABNORMAL HIGH (ref 70–99)
Glucose-Capillary: 130 mg/dL — ABNORMAL HIGH (ref 70–99)
Glucose-Capillary: 103 mg/dL — ABNORMAL HIGH (ref 70–99)
Glucose-Capillary: 88 mg/dL (ref 70–99)

## 2022-11-14 LAB — BPAM RBC
Blood Product Expiration Date: 202407282359
Blood Product Expiration Date: 202407312359
ISSUE DATE / TIME: 202406280107
ISSUE DATE / TIME: 202406300807
Unit Type and Rh: 1700
Unit Type and Rh: 1700

## 2022-11-14 MED ORDER — FUROSEMIDE 10 MG/ML IJ SOLN
40.0000 mg | Freq: Two times a day (BID) | INTRAMUSCULAR | Status: AC
  Administered 2022-11-14 (×2): 40 mg via INTRAVENOUS
  Filled 2022-11-14 (×2): qty 4

## 2022-11-14 MED ORDER — SORBITOL 70 % SOLN
30.0000 mL | Freq: Once | Status: AC
  Administered 2022-11-14: 30 mL via ORAL
  Filled 2022-11-14: qty 30

## 2022-11-14 MED ORDER — FUROSEMIDE 40 MG PO TABS: 40.0000 mg | ORAL_TABLET | Freq: Every day | ORAL | Status: DC

## 2022-11-14 MED ORDER — FUROSEMIDE 10 MG/ML IJ SOLN: 40.0000 mg | Freq: Once | INTRAMUSCULAR | Status: DC

## 2022-11-14 MED ORDER — METOPROLOL TARTRATE 25 MG/10 ML ORAL SUSPENSION
25.0000 mg | Freq: Two times a day (BID) | ORAL | Status: DC
  Filled 2022-11-14 (×11): qty 10

## 2022-11-14 MED ORDER — METOPROLOL TARTRATE 25 MG PO TABS
25.0000 mg | ORAL_TABLET | Freq: Two times a day (BID) | ORAL | Status: DC
  Administered 2022-11-14 – 2022-11-21 (×14): 25 mg via ORAL
  Filled 2022-11-14 (×14): qty 1

## 2022-11-14 MED ORDER — POTASSIUM CHLORIDE CRYS ER 20 MEQ PO TBCR: 20.0000 meq | EXTENDED_RELEASE_TABLET | Freq: Every day | ORAL | Status: DC

## 2022-11-14 MED ORDER — AMIODARONE IV BOLUS ONLY 150 MG/100ML
150.0000 mg | Freq: Once | INTRAVENOUS | Status: AC
  Administered 2022-11-14: 150 mg via INTRAVENOUS
  Filled 2022-11-14: qty 100

## 2022-11-14 MED ORDER — HYDRALAZINE HCL 25 MG PO TABS
25.0000 mg | ORAL_TABLET | Freq: Three times a day (TID) | ORAL | Status: DC
  Administered 2022-11-14 – 2022-11-21 (×20): 25 mg via ORAL
  Filled 2022-11-14 (×20): qty 1

## 2022-11-14 MED ORDER — POTASSIUM CHLORIDE CRYS ER 20 MEQ PO TBCR
40.0000 meq | EXTENDED_RELEASE_TABLET | Freq: Every day | ORAL | Status: DC
  Administered 2022-11-14: 40 meq via ORAL
  Filled 2022-11-14: qty 2

## 2022-11-14 NOTE — Anesthesia Postprocedure Evaluation (Signed)
Anesthesia Post Note  Patient: Collin Bridges [facility]  Procedure(s) Performed: CORONARY ARTERY BYPASS GRAFTING (CABG) X5 USING LEFT INTERNAL MAMMARY ARTERY (LIMA) AND ENDOSCOPICALLY HARVESTED RIGHT AND LEFT GREATER SAPHENOUS VEIN (Chest) TRANSESOPHAGEAL ECHOCARDIOGRAM     Patient location during evaluation: SICU Anesthesia Type: General Level of consciousness: sedated Pain management: pain level controlled Vital Signs Assessment: post-procedure vital signs reviewed and stable Respiratory status: patient remains intubated per anesthesia plan Cardiovascular status: stable Postop Assessment: no apparent nausea or vomiting Anesthetic complications: no   No notable events documented.  Last Vitals:  Vitals:   11/14/22 0500 11/14/22 0600  BP: (!) 144/71   Pulse: 74 75  Resp: 19 18  Temp: 37.1 C 37.1 C  SpO2: 97% 97%    Last Pain:  Vitals:   11/14/22 0400  TempSrc: Bladder  PainSc: 0-No pain                 Kennieth Rad

## 2022-11-14 NOTE — Progress Notes (Signed)
Patient ID: Collin Bridges, male   DOB: 1946/03/05, 77 y.o.   MRN: [id number]  TCTS Evening Rounds:  Hypertensive to 150-170. Increase Lopressor to 25 bid and resume his preop hydralazine 25 tid. He was also on Cozaar 100 but will hold on that for now to be sure creat remains stable.  Diuresed with lasix this am and to receive another dose tonight.  Ambulated short distance today. Remains deconditioned.

## 2022-11-14 NOTE — Evaluation (Signed)
Occupational Therapy Evaluation Patient Details Name: Collin Bridges MRN: [id number] DOB: Sep 18, 1945 Today's Date: 11/14/2022   History of Present Illness 77 yo male admitted 6/27 for CABG x 5. PMHX: CAD, HTN, anxiety, BPH, HLD, CKD, CVA, T2DM, RLS   Clinical Impression   Pt reports independence at baseline with ADLs and was using walking stick for mobility , lives alone. Pt needing min-max A for ADLs, mod A for bed mobility and mod A +2 for transfers with RW. Pt with overall flat affect, needs min cues for adhering to sternal precautions throughout session. Pt presenting with impairments listed below, will follow acutely. Patient will benefit from continued inpatient follow up therapy, <3 hours/day to maximize safety/ind with ADLs/functional mobility.      Recommendations for follow up therapy are one component of a multi-disciplinary discharge planning process, led by the attending physician.  Recommendations may be updated based on patient status, additional functional criteria and insurance authorization.   Assistance Recommended at Discharge Frequent or constant Supervision/Assistance  Patient can return home with the following A lot of help with walking and/or transfers;A lot of help with bathing/dressing/bathroom;Assistance with cooking/housework;Direct supervision/assist for medications management;Direct supervision/assist for financial management;Assist for transportation;Help with stairs or ramp for entrance    Functional Status Assessment  Patient has had a recent decline in their functional status and demonstrates the ability to make significant improvements in function in a reasonable and predictable amount of time.  Equipment Recommendations  Other (comment) (defer)    Recommendations for Other Services PT consult     Precautions / Restrictions Precautions Precautions: Sternal;Fall;Other (comment) Precaution Booklet Issued: No Precaution Comments: external pacer,  watch SPO2 Restrictions RUE Weight Bearing: Non weight bearing LUE Weight Bearing: Non weight bearing Other Position/Activity Restrictions: sternal precautions      Mobility Bed Mobility Overal bed mobility: Needs Assistance Bed Mobility: Supine to Sit     Supine to sit: Mod assist     General bed mobility comments: use of bed pad    Transfers Overall transfer level: Needs assistance Equipment used: Rolling walker (2 wheels) Transfers: Sit to/from Stand Sit to Stand: Mod assist, +2 physical assistance                  Balance Overall balance assessment: Needs assistance Sitting-balance support: No upper extremity supported, Feet supported Sitting balance-Leahy Scale: Fair     Standing balance support: Bilateral upper extremity supported, During functional activity Standing balance-Leahy Scale: Poor Standing balance comment: relaint on external support                           ADL either performed or assessed with clinical judgement   ADL Overall ADL's : Needs assistance/impaired Eating/Feeding: Sitting;Minimal assistance   Grooming: Minimal assistance;Sitting   Upper Body Bathing: Moderate assistance;Sitting   Lower Body Bathing: Maximal assistance;Sitting/lateral leans   Upper Body Dressing : Moderate assistance;Sitting   Lower Body Dressing: Maximal assistance;Sitting/lateral leans;Sit to/from stand   Toilet Transfer: Moderate assistance;+2 for physical assistance   Toileting- Clothing Manipulation and Hygiene: Maximal assistance       Functional mobility during ADLs: Moderate assistance;+2 for physical assistance       Vision Baseline Vision/History: 4 Cataracts Vision Assessment?: No apparent visual deficits Additional Comments: WFL for BADL     Perception Perception Perception Tested?: No   Praxis Praxis Praxis tested?: Not tested    Pertinent Vitals/Pain Pain Assessment Pain Assessment: Faces Pain Score: 4  Faces  Pain Scale: Hurts little more Pain Location: chest/incision Pain Descriptors / Indicators: Sore Pain Intervention(s): Limited activity within patient's tolerance, Monitored during session, Repositioned     Hand Dominance Right   Extremity/Trunk Assessment Upper Extremity Assessment Upper Extremity Assessment: Generalized weakness   Lower Extremity Assessment Lower Extremity Assessment: Defer to PT evaluation   Cervical / Trunk Assessment Cervical / Trunk Assessment: Kyphotic   Communication Communication Communication: No difficulties   Cognition Arousal/Alertness: Awake/alert Behavior During Therapy: Flat affect Overall Cognitive Status: Impaired/Different from baseline Area of Impairment: Memory, Problem solving                     Memory: Decreased recall of precautions       Problem Solving: Slow processing General Comments: pt with very flat affect but able to answer questions appropriately and accurately, followed all commands with increased time     General Comments  VSS, incision clean, dry intact    Exercises     Shoulder Instructions      Home Living Family/patient expects to be discharged to:: Private residence Living Arrangements: Alone Available Help at Discharge: Friend(s);Available PRN/intermittently Type of Home: House Home Access: Stairs to enter [employer] of Steps: 5   Home Layout: One level     Bathroom Shower/Tub: Chief Strategy Officer: Standard     Home Equipment: Grab bars - tub/shower          Prior Functioning/Environment Prior Level of Function : Independent/Modified Independent             Mobility Comments: uses walking stick ADLs Comments: does not drive, ind with ADLs/IADLs        OT Problem List: Decreased strength;Decreased range of motion;Decreased activity tolerance;Impaired balance (sitting and/or standing);Decreased cognition;Decreased safety awareness;Decreased knowledge  of precautions;Cardiopulmonary status limiting activity      OT Treatment/Interventions: Self-care/ADL training;Therapeutic exercise;Energy conservation;DME and/or AE instruction;Therapeutic activities;Patient/family education;Balance training    OT Goals(Current goals can be found in the care plan section) Acute Rehab OT Goals Patient Stated Goal: none stated OT Goal Formulation: With patient Time For Goal Achievement: 11/28/22 Potential to Achieve Goals: Good ADL Goals Pt Will Perform Upper Body Dressing: sitting;with min assist Pt Will Perform Lower Body Dressing: sitting/lateral leans;sit to/from stand;with min assist Pt Will Transfer to Toilet: ambulating;regular height toilet;with min guard assist Pt Will Perform Tub/Shower Transfer: Tub transfer;Shower transfer;ambulating;with min assist  OT Frequency: Min 1X/week    Co-evaluation              AM-PAC OT "6 Clicks" Daily Activity     Outcome Measure Help from another person eating meals?: A Little Help from another person taking care of personal grooming?: A Little Help from another person toileting, which includes using toliet, bedpan, or urinal?: A Lot Help from another person bathing (including washing, rinsing, drying)?: A Lot Help from another person to put on and taking off regular upper body clothing?: A Lot Help from another person to put on and taking off regular lower body clothing?: A Lot 6 Click Score: 14   End of Session Equipment Utilized During Treatment: Rolling walker (2 wheels) Nurse Communication: Mobility status  Activity Tolerance: Patient tolerated treatment well Patient left: in chair;with call bell/phone within reach;with chair alarm set  OT Visit Diagnosis: Muscle weakness (generalized) (M62.81);Other abnormalities of gait and mobility (R26.89)                Time: 4742-5956 OT Time Calculation (min): 39 min Charges:  OT General Charges $OT Visit: 1 Visit OT Evaluation $OT Eval Moderate  Complexity: 1 Mod OT Treatments $Self Care/Home Management : 8-22 mins  Carver Fila, OTD, OTR/L SecureChat Preferred Acute Rehab [phone]   Carver Fila Koonce 11/14/2022, 1:04 PM

## 2022-11-14 NOTE — Progress Notes (Signed)
Speech Language Pathology Treatment: Dysphagia  Patient Details Name: Collin Bridges MRN: [id number] DOB: 10-09-1945 Today's Date: 11/14/2022 Time: 4098-1191 SLP Time Calculation (min) (ACUTE ONLY): 10 min  Assessment / Plan / Recommendation Clinical Impression  Pt sitting in recliner after PT. He is tolerating dysphagia 2 diet, thin liquids well with slowed mastication but overall functional swallowing, no s/s of aspiration per observation and per RN report. Voice low volume; affect remains flat.  Tongue deviation to the left was noted by SLP yesterday and brought to RN's attention again today.   Continue current diet remains appropriate. SLP will follow for safety/diet progression.   HPI HPI: Patient is a 77 y.o. male with PMH: CAD, HTN, anxiety, BPH, HLD, CKD, CVA, DM-2, RLS. He presented to the hospital on 11/10/22 for CABG x5. He was intubated 6/27 for the procedure and extubated the following day, 6/28 at 0820. RN reported that patient had coughing during and after taking pills in pudding and he expectorated phlegm but also a whole pill. Pt has a hx of CVAs; he is a pt of Dr. Marlis Edelson, neurology.  Last visit 08/2022 scored 22/36 per [facility]OC[facility]. No focal cranial nerve deficits.      SLP Plan  Continue with current plan of care      Recommendations for follow up therapy are one component of a multi-disciplinary discharge planning process, led by the attending physician.  Recommendations may be updated based on patient status, additional functional criteria and insurance authorization.    Recommendations  Diet recommendations: Dysphagia 2 (fine chop);Thin liquid Liquids provided via: Cup;Straw Medication Administration: Whole meds with puree Supervision: Patient able to self feed Postural Changes and/or Swallow Maneuvers: Seated upright 90 degrees                  Oral care BID   None Dysphagia, unspecified (R13.10)     Continue with current plan of care    Collin Lecker L.  [name], MA CCC/SLP Clinical Specialist - Acute Care SLP Acute Rehabilitation Services Office number [phone]  [name]  11/14/2022, 10:40 AM

## 2022-11-14 NOTE — Progress Notes (Signed)
Physical Therapy Treatment Patient Details Name: Nijee Frisbey MRN: [id number] DOB: 1946-04-23 Today's Date: 11/14/2022   History of Present Illness 77 yo male admitted 6/27 for CABG x 5. PMHX: CAD, HTN, anxiety, BPH, HLD, CKD, CVA, T2DM, RLS    PT Comments    Pt with flat affect and depressed spirits but answers all questions appropriate and accurately and follows commands with increased time. Pt very deconditioned and requires modAx2 transfers and ambulation with RW. Pt with short shuffled steps and quickly fatigues. VSS. Pt remains appropriate for rehab program < 3 hours a day. Acute PT to cont to follow.    Recommendations for follow up therapy are one component of a multi-disciplinary discharge planning process, led by the attending physician.  Recommendations may be updated based on patient status, additional functional criteria and insurance authorization.  Follow Up Recommendations  Can patient physically be transported by private vehicle: No    Assistance Recommended at Discharge Frequent or constant Supervision/Assistance  Patient can return home with the following A lot of help with walking and/or transfers;A lot of help with bathing/dressing/bathroom;Assistance with cooking/housework;Assist for transportation;Help with stairs or ramp for entrance   Equipment Recommendations  Rolling walker (2 wheels);BSC/3in1    Recommendations for Other Services       Precautions / Restrictions Precautions Precautions: Sternal;Fall;Other (comment) Precaution Booklet Issued: No Precaution Comments: external pacer, watch SPO2 Restrictions Weight Bearing Restrictions: Yes RUE Weight Bearing: Non weight bearing LUE Weight Bearing: Non weight bearing Other Position/Activity Restrictions: sternal precautions     Mobility  Bed Mobility Overal bed mobility: Needs Assistance Bed Mobility: Supine to Sit     Supine to sit: Mod assist     General bed mobility comments: HOB  elevated, helicopter transfer completed, pt able to slowly bring LEs off EOB with verbal cues, modAx2 for trunk elevation and to scoot hips to EOB using bed pad under patient    Transfers Overall transfer level: Needs assistance   Transfers: Sit to/from Stand Sit to Stand: Mod assist, +2 physical assistance           General transfer comment: mod cues for hands on thighs with assist to lift sacrum with modAx2, pt with posterior bias requiring modAx2 to bring weight forward and grab on to RW, completed 2 sit to stand    Ambulation/Gait Ambulation/Gait assistance: Mod assist, +2 safety/equipment Gait Distance (Feet): 25 Feet Assistive device: Rolling walker (2 wheels) Gait Pattern/deviations: Decreased stride length, Narrow base of support, Trunk flexed, Step-to pattern, Shuffle Gait velocity: dec Gait velocity interpretation: <1.8 ft/sec, indicate of risk for recurrent falls   General Gait Details: pt with physical assist to direct and advance RW, mod cues for posture and proximity to RW, short shuffling strides with cues for increased BOS and step length, limited by fatigue   Stairs             Wheelchair Mobility    Modified Rankin (Stroke Patients Only)       Balance Overall balance assessment: Needs assistance Sitting-balance support: No upper extremity supported, Feet supported Sitting balance-Leahy Scale: Fair     Standing balance support: Bilateral upper extremity supported, During functional activity Standing balance-Leahy Scale: Poor Standing balance comment: bil UE on RW in standing, pt leaned against sink when washing face with OT and supported self with L UE                            Cognition  Arousal/Alertness: Awake/alert Behavior During Therapy: Flat affect Overall Cognitive Status: Impaired/Different from baseline Area of Impairment: Memory, Problem solving                     Memory: Decreased recall of precautions        Problem Solving: Slow processing General Comments: pt with very flat affect but able to answer questions appropriately and accurately, followed all commands with increased time        Exercises      General Comments General comments (skin integrity, edema, etc.): VSS, dressing intact, sternal incision stable      Pertinent Vitals/Pain Pain Assessment Pain Assessment: Faces Pain Score: 4  Faces Pain Scale: Hurts even more Pain Location: chest/incision Pain Descriptors / Indicators: Sore Pain Intervention(s): Monitored during session    Home Living Family/patient expects to be discharged to:: Private residence Living Arrangements: Alone Available Help at Discharge: Friend(s);Available PRN/intermittently Type of Home: House Home Access: Stairs to enter   Entrance Stairs-Number of Steps: 5   Home Layout: One level Home Equipment: Grab bars - tub/shower      Prior Function            PT Goals (current goals can now be found in the care plan section) Acute Rehab PT Goals Patient Stated Goal: home PT Goal Formulation: With patient Time For Goal Achievement: 11/26/22 Potential to Achieve Goals: Good Progress towards PT goals: Progressing toward goals    Frequency    Min 1X/week      PT Plan Current plan remains appropriate    Co-evaluation              AM-PAC PT "6 Clicks" Mobility   Outcome Measure  Help needed turning from your back to your side while in a flat bed without using bedrails?: A Lot Help needed moving from lying on your back to sitting on the side of a flat bed without using bedrails?: A Lot Help needed moving to and from a bed to a chair (including a wheelchair)?: A Lot Help needed standing up from a chair using your arms (e.g., wheelchair or bedside chair)?: A Lot Help needed to walk in hospital room?: A Lot Help needed climbing 3-5 steps with a railing? : Total 6 Click Score: 11    End of Session Equipment Utilized During  Treatment: Gait belt;Oxygen Activity Tolerance: Patient tolerated treatment well Patient left: with call bell/phone within reach;in chair;with chair alarm set Nurse Communication: Mobility status PT Visit Diagnosis: Other abnormalities of gait and mobility (R26.89);Muscle weakness (generalized) (M62.81);Difficulty in walking, not elsewhere classified (R26.2)     Time: 0822-0859 PT Time Calculation (min) (ACUTE ONLY): 37 min  Charges:  $Gait Training: 8-22 mins                     Lewis Shock, PT, DPT Acute Rehabilitation Services Secure chat preferred Office #: [phone]    Iona Hansen 11/14/2022, 9:18 AM

## 2022-11-14 NOTE — Progress Notes (Addendum)
TCTS DAILY ICU PROGRESS NOTE                   [address]            [place] [address]          [phone]   4 Days Post-Op Procedure(s) (LRB): CORONARY ARTERY BYPASS GRAFTING (CABG) X5 USING LEFT INTERNAL MAMMARY ARTERY (LIMA) AND ENDOSCOPICALLY HARVESTED RIGHT AND LEFT GREATER SAPHENOUS VEIN (N/A) TRANSESOPHAGEAL ECHOCARDIOGRAM (N/A)  Total Length of Stay:  LOS: 4 days   Subjective: Patient sleeping this am and awakened. He has not had a bowel movement yet.  Objective: Vital signs in last 24 hours: Temp:  [97.7 F (36.5 C)-100 F (37.8 C)] 98.8 F (37.1 C) (07/01 0600) Pulse Rate:  [64-90] 75 (07/01 0600) Cardiac Rhythm: Normal sinus rhythm (06/30 2000) Resp:  [18-55] 18 (07/01 0600) BP: (112-160)/(53-122) 144/71 (07/01 0500) SpO2:  [42 %-98 %] 97 % (07/01 0600)  Filed Weights   11/11/22 0500 11/12/22 0500 11/13/22 0500  Weight: 88.4 kg 89.6 kg 89.7 kg      Intake/Output from previous day: 06/30 0701 - 07/01 0700 In: 634.4 [P.O.:240; I.V.:79.4; Blood:315] Out: 927 [Urine:927]  Intake/Output this shift: Total I/O In: -  Out: 250 [Urine:250]  Current Meds: Scheduled Meds:  acetaminophen  1,000 mg Oral Q6H   Or   acetaminophen (TYLENOL) oral liquid 160 mg/5 mL  1,000 mg Per Tube Q6H   amiodarone  400 mg Oral BID   aspirin EC  325 mg Oral Daily   Or   aspirin  324 mg Per Tube Daily   atorvastatin  80 mg Oral QPM   bisacodyl  10 mg Oral Daily   Or   bisacodyl  10 mg Rectal Daily   Chlorhexidine Gluconate Cloth  6 each Topical Daily   docusate sodium  200 mg Oral Daily   fenofibrate  160 mg Oral Daily   fluticasone  2 spray Each Nare Daily   gabapentin  300 mg Oral QHS   insulin aspart  0-15 Units Subcutaneous TID WC   insulin glargine-yfgn  20 Units Subcutaneous BID   metoprolol tartrate  12.5 mg Oral BID   Or   metoprolol tartrate  12.5 mg Per Tube BID   pantoprazole  40 mg Oral Daily   PARoxetine  40 mg Oral BH-q7a   zolpidem   5 mg Oral QHS   Continuous Infusions: PRN Meds:.metoprolol tartrate, midazolam, morphine injection, ondansetron (ZOFRAN) IV, mouth rinse, oxyCODONE, traMADol  General appearance: cooperative and no distress Neurologic: intact Heart: RRR, grade II/VI systolic murmur Lungs: Diminished bibasilar breath sounds  Abdomen: Soft, non tender, sporadic bowel sounds this am Extremities: SCDs in place Wound: Sternal and bilateral LE wounds are clean, dry, healing without signs of infection.  Lab Results: CBC: Recent Labs    11/13/22 1253 11/14/22 0609  WBC 8.7 5.9  HGB 8.9* 8.6*  HCT 27.3* 26.1*  PLT 66* 71*   BMET:  Recent Labs    11/12/22 0450 11/12/22 0908 11/13/22 0549  NA 139 140 138  K 4.5 4.6 3.7  CL 109  --  107  CO2 22  --  25  GLUCOSE 163*  --  130*  BUN 24*  --  33*  CREATININE 1.58*  --  1.77*  CALCIUM 7.8*  --  7.9*    CMET: Lab Results  Component Value Date   WBC 5.9 11/14/2022   HGB 8.6 (L) 11/14/2022   HCT  26.1 (L) 11/14/2022   PLT 71 (L) 11/14/2022   GLUCOSE 130 (H) 11/13/2022   CHOL 88 (L) 10/31/2022   TRIG 122 10/31/2022   HDL 49 10/31/2022   LDLDIRECT 146.0 06/03/2022   LDLCALC 17 10/31/2022   ALT 26 11/09/2022   AST 29 11/09/2022   NA 138 11/13/2022   K 3.7 11/13/2022   CL 107 11/13/2022   CREATININE 1.77 (H) 11/13/2022   BUN 33 (H) 11/13/2022   CO2 25 11/13/2022   TSH 0.98 06/03/2022   PSA 3.06 06/03/2022   INR 2.1 (H) 11/10/2022   HGBA1C 5.8 (H) 11/09/2022   MICROALBUR <0.7 06/03/2022      PT/INR: No results for input(s): "LABPROT", "INR" in the last 72 hours. Radiology: No results found.   Assessment/Plan: S/P Procedure(s) (LRB): CORONARY ARTERY BYPASS GRAFTING (CABG) X5 USING LEFT INTERNAL MAMMARY ARTERY (LIMA) AND ENDOSCOPICALLY HARVESTED RIGHT AND LEFT GREATER SAPHENOUS VEIN (N/A) TRANSESOPHAGEAL ECHOCARDIOGRAM (N/A) CV-Previous a fib with RVR. SR. On Amiodarone 400 mg bid and Lopressor 12.5 mg bid. Monitor BP as may need  additional medication;was on Hydralazine and Losartan prior to surgery. Has EPW but not on back up pacer. Pulmonary-on 2 L via [place]. Wean as able. CXR this am appears stable (low lung volumes, bibasilar atelectasis and pleural effusions). Encourage incentive spirometer CKD (stage IIIb) -Creatinine prior to surgery around 1.4-1.5. Creatinine this am decreased from 1.77 to 1.49. Give Lasix IV again today. Expected post op blood loss anemia-H and H this am slightly decreased to 8.6 and 26.1. Had transfusion yesterday. DM-CBGs 173/113/101. Pre op HGA1C 5.8. Will restart Metformin closer to discharge 6. Thrombocytopenia-platelets this am slightly increased to 71,000. Not on Heparin/Lovenox 7. Hope to remove EPW soon 8. Supplement potassium 9. GI-LOC constipation. On Dysphagia II diet. Speech pathology following. 10. Deconditioned-continue PT/OT. He will need placement when ready for discharge  Donielle Margaretann Loveless PA-C 11/14/2022 7:00 AM  Patient seen and examined, agree with above Will give Lasix BID today as he is still fluid overloaded Dc pacing wires and central line  Viviann Spare C. Dorris Fetch, MD [facility] [phone]

## 2022-11-15 ENCOUNTER — Inpatient Hospital Stay (HOSPITAL_COMMUNITY): Payer: 59

## 2022-11-15 LAB — CBC
HCT: 29.8 % — ABNORMAL LOW (ref 39.0–52.0)
Hemoglobin: 10.2 g/dL — ABNORMAL LOW (ref 13.0–17.0)
MCH: 29.8 pg (ref 26.0–34.0)
MCHC: 34.2 g/dL (ref 30.0–36.0)
MCV: 87.1 fL (ref 80.0–100.0)
Platelets: 77 10*3/uL — ABNORMAL LOW (ref 150–400)
RBC: 3.42 MIL/uL — ABNORMAL LOW (ref 4.22–5.81)
RDW: 15.2 % (ref 11.5–15.5)
WBC: 7.8 10*3/uL (ref 4.0–10.5)
nRBC: 0 % (ref 0.0–0.2)

## 2022-11-15 LAB — GLUCOSE, CAPILLARY
Glucose-Capillary: 60 mg/dL — ABNORMAL LOW (ref 70–99)
Glucose-Capillary: 78 mg/dL (ref 70–99)
Glucose-Capillary: 140 mg/dL — ABNORMAL HIGH (ref 70–99)
Glucose-Capillary: 136 mg/dL — ABNORMAL HIGH (ref 70–99)
Glucose-Capillary: 95 mg/dL (ref 70–99)
Glucose-Capillary: 115 mg/dL — ABNORMAL HIGH (ref 70–99)

## 2022-11-15 LAB — BASIC METABOLIC PANEL
Anion gap: 11 (ref 5–15)
BUN: 27 mg/dL — ABNORMAL HIGH (ref 8–23)
CO2: 24 mmol/L (ref 22–32)
Calcium: 8.3 mg/dL — ABNORMAL LOW (ref 8.9–10.3)
Chloride: 108 mmol/L (ref 98–111)
Creatinine, Ser: 1.4 mg/dL — ABNORMAL HIGH (ref 0.61–1.24)
GFR, Estimated: 52 mL/min — ABNORMAL LOW (ref >60)
Glucose, Bld: 69 mg/dL — ABNORMAL LOW (ref 70–99)
Potassium: 3.6 mmol/L (ref 3.5–5.1)
Sodium: 143 mmol/L (ref 135–145)

## 2022-11-15 MED ORDER — FUROSEMIDE 10 MG/ML IJ SOLN: 40.0000 mg | Freq: Two times a day (BID) | INTRAMUSCULAR | Status: DC

## 2022-11-15 MED ORDER — POTASSIUM CHLORIDE CRYS ER 10 MEQ PO TBCR
20.0000 meq | EXTENDED_RELEASE_TABLET | Freq: Every day | ORAL | Status: DC
  Administered 2022-11-15 – 2022-11-17 (×3): 20 meq via ORAL
  Filled 2022-11-15 (×5): qty 2

## 2022-11-15 MED ORDER — INSULIN GLARGINE-YFGN 100 UNIT/ML ~~LOC~~ SOLN
20.0000 [IU] | Freq: Every day | SUBCUTANEOUS | Status: DC
  Filled 2022-11-15: qty 0.2

## 2022-11-15 MED ORDER — ~~LOC~~ CARDIAC SURGERY, PATIENT & FAMILY EDUCATION: Freq: Once | Status: AC

## 2022-11-15 MED ORDER — SODIUM CHLORIDE 0.9% FLUSH
3.0000 mL | Freq: Two times a day (BID) | INTRAVENOUS | Status: DC
  Administered 2022-11-15 – 2022-11-21 (×13): 3 mL via INTRAVENOUS

## 2022-11-15 MED ORDER — LOSARTAN POTASSIUM 50 MG PO TABS
100.0000 mg | ORAL_TABLET | Freq: Every day | ORAL | Status: DC
  Administered 2022-11-15 – 2022-11-21 (×7): 100 mg via ORAL
  Filled 2022-11-15 (×7): qty 2

## 2022-11-15 MED ORDER — SODIUM CHLORIDE 0.9 % IV SOLN: 250.0000 mL | INTRAVENOUS | Status: DC | PRN

## 2022-11-15 MED ORDER — FUROSEMIDE 40 MG PO TABS
40.0000 mg | ORAL_TABLET | Freq: Every day | ORAL | Status: DC
  Administered 2022-11-15 – 2022-11-17 (×3): 40 mg via ORAL
  Filled 2022-11-15 (×3): qty 1

## 2022-11-15 MED ORDER — MAGNESIUM HYDROXIDE 400 MG/5ML PO SUSP: 30.0000 mL | Freq: Every day | ORAL | Status: DC | PRN

## 2022-11-15 MED ORDER — POTASSIUM CHLORIDE CRYS ER 20 MEQ PO TBCR: 40.0000 meq | EXTENDED_RELEASE_TABLET | Freq: Two times a day (BID) | ORAL | Status: DC

## 2022-11-15 MED ORDER — ALUM & MAG HYDROXIDE-SIMETH 200-200-20 MG/5ML PO SUSP: 15.0000 mL | Freq: Four times a day (QID) | ORAL | Status: DC | PRN

## 2022-11-15 MED ORDER — AMIODARONE HCL 200 MG PO TABS
200.0000 mg | ORAL_TABLET | Freq: Two times a day (BID) | ORAL | Status: DC
  Administered 2022-11-15 – 2022-11-21 (×13): 200 mg via ORAL
  Filled 2022-11-15 (×13): qty 1

## 2022-11-15 MED ORDER — SODIUM CHLORIDE 0.9% FLUSH: 3.0000 mL | INTRAVENOUS | Status: DC | PRN

## 2022-11-15 NOTE — Progress Notes (Signed)
Physical Therapy Treatment Patient Details Name: Collin Bridges MRN: [id number] DOB: 21-Jul-1945 Today's Date: 11/15/2022   History of Present Illness 77 yo male admitted 6/27 for CABG x 5. PMHX: CAD, HTN, anxiety, BPH, HLD, CKD, CVA, T2DM, RLS    PT Comments  Pt much improved this date. Pt able to amb 150' with RW this date and modA. Pt remains very deconditioned over all and requires RW for mobility. Pt with decreased activity tolerance as well. Continue to recommend inpatient rehab program < 3 hours of therapy a day to achieve safe mod I level of function. Acute PT to cont to follow.     Assistance Recommended at Discharge Frequent or constant Supervision/Assistance  If plan is discharge home, recommend the following:  Can travel by private vehicle    A lot of help with walking and/or transfers;A lot of help with bathing/dressing/bathroom;Assistance with cooking/housework;Assist for transportation;Help with stairs or ramp for entrance   No  Equipment Recommendations  Rolling walker (2 wheels);BSC/3in1    Recommendations for Other Services       Precautions / Restrictions Precautions Precautions: Sternal;Fall;Other (comment) Precaution Booklet Issued: No Precaution Comments: external pacer, watch SPO2 Restrictions Weight Bearing Restrictions: Yes (sternal prec.) RUE Weight Bearing: Non weight bearing LUE Weight Bearing: Non weight bearing Other Position/Activity Restrictions: sternal precautions     Mobility  Bed Mobility Overal bed mobility: Needs Assistance Bed Mobility: Sit to Supine Rolling: Mod assist     Sit to supine: Mod assist   General bed mobility comments: pt hugged heart pillow, modA for LE management back up into bed, went down on R side and then rolled over onto back    Transfers Overall transfer level: Needs assistance Equipment used: Rolling walker (2 wheels) Transfers: Sit to/from Stand Sit to Stand: Mod assist           General  transfer comment: modA to power up from chair, pt with posterior bias requiring assist with weight shift forward    Ambulation/Gait Ambulation/Gait assistance: Mod assist, +2 safety/equipment Gait Distance (Feet): 150 Feet Assistive device: Rolling walker (2 wheels) Gait Pattern/deviations: Decreased stride length, Narrow base of support, Trunk flexed, Step-to pattern, Shuffle Gait velocity: dec Gait velocity interpretation: <1.8 ft/sec, indicate of risk for recurrent falls   General Gait Details: pt vearing to the L, modA for walker management, constant verbal cues to stand up right due to increased trunk flexion, pt initially minA however with onset of fatigue pt requiring increased assist   Stairs             Wheelchair Mobility     Tilt Bed    Modified Rankin (Stroke Patients Only)       Balance Overall balance assessment: Needs assistance Sitting-balance support: No upper extremity supported, Feet supported Sitting balance-Leahy Scale: Fair     Standing balance support: Bilateral upper extremity supported, During functional activity Standing balance-Leahy Scale: Poor Standing balance comment: relaint on external support                            Cognition Arousal/Alertness: Awake/alert Behavior During Therapy: Flat affect Overall Cognitive Status: Impaired/Different from baseline Area of Impairment: Memory, Problem solving                     Memory: Decreased recall of precautions       Problem Solving: Slow processing General Comments: pt with very flat affect but able to answer questions  appropriately and accurately, followed all commands with increased time        Exercises General Exercises - Lower Extremity Ankle Circles/Pumps: AROM, Both, 10 reps, Supine Quad Sets: AROM, Both, 10 reps, Supine    General Comments General comments (skin integrity, edema, etc.): VSS on RA      Pertinent Vitals/Pain Pain Assessment Pain  Assessment: Faces Faces Pain Scale: Hurts little more Pain Location: chest/incision Pain Descriptors / Indicators: Sore    Home Living                          Prior Function            PT Goals (current goals can now be found in the care plan section) Acute Rehab PT Goals Patient Stated Goal: home PT Goal Formulation: With patient Time For Goal Achievement: 11/26/22 Potential to Achieve Goals: Good Progress towards PT goals: Progressing toward goals    Frequency    Min 1X/week      PT Plan Current plan remains appropriate    Co-evaluation              AM-PAC PT "6 Clicks" Mobility   Outcome Measure  Help needed turning from your back to your side while in a flat bed without using bedrails?: A Lot Help needed moving from lying on your back to sitting on the side of a flat bed without using bedrails?: A Lot Help needed moving to and from a bed to a chair (including a wheelchair)?: A Lot Help needed standing up from a chair using your arms (e.g., wheelchair or bedside chair)?: A Lot Help needed to walk in hospital room?: A Lot Help needed climbing 3-5 steps with a railing? : Total 6 Click Score: 11    End of Session Equipment Utilized During Treatment: Gait belt Activity Tolerance: Patient tolerated treatment well Patient left: with call bell/phone within reach;in bed Nurse Communication: Mobility status PT Visit Diagnosis: Other abnormalities of gait and mobility (R26.89);Muscle weakness (generalized) (M62.81);Difficulty in walking, not elsewhere classified (R26.2)     Time: 1610-9604 PT Time Calculation (min) (ACUTE ONLY): 23 min  Charges:    $Gait Training: 8-22 mins $Therapeutic Exercise: 8-22 mins PT General Charges $$ ACUTE PT VISIT: 1 Visit                     Lewis Shock, PT, DPT Acute Rehabilitation Services Secure chat preferred Office #: [phone]    Iona Hansen 11/15/2022, 11:36 AM

## 2022-11-15 NOTE — NC FL2 (Addendum)
[name] MEDICAID FL2 LEVEL OF CARE FORM     IDENTIFICATION  Patient Name: Collin Bridges Birthdate: [birth year] Sex: male Admission Date (Current Location): 11/10/2022  [facility] and [place] Number:  Producer, television/film/video and Address:  [facility] [place], [address], [place], [place] [address]      Provider Number: [id number]  Attending Physician Name and Address:  Eugenio Hoes, MD  Relative Name and Phone Number:  Lynnette Caffey Significant other [phone]    Current Level of Care: Hospital Recommended Level of Care: Skilled Nursing Facility Prior Approval Number:    Date Approved/Denied:   PASRR Number: PASRR under review  Discharge Plan: SNF    Current Diagnoses: Patient Active Problem List   Diagnosis Date Noted   Coronary artery disease 11/10/2022   CAD (coronary artery disease) 10/31/2022   Aortic atherosclerosis (HCC) 10/05/2022   NSTEMI (non-ST elevated myocardial infarction) (HCC) 07/12/2022   Abnormal cardiac CT angiography 07/12/2022   Severe sepsis (HCC) 07/09/2022   Chest pain 07/09/2022   Elevated troponin 07/09/2022   Depression 07/09/2022   Allergic rhinitis 07/09/2022   Chronic diastolic CHF (congestive heart failure) (HCC) 07/09/2022   CAP (community acquired pneumonia) 07/08/2022   Left leg cellulitis 06/03/2022   Cat scratch of left lower leg 05/18/2022   Skin infection 05/18/2022   DM2 (diabetes mellitus, type 2) (HCC) 12/01/2021   RLS (restless legs syndrome) 12/01/2021   Vitamin D deficiency 06/03/2021   History of stroke 04/25/2021   CVA (cerebral vascular accident) (HCC) 04/07/2021   Brain mass 03/17/2021   Headache 03/17/2021   Cerebral atrophy (HCC) 03/17/2021   Stroke of right basal ganglia (HCC) 03/17/2021   Forehead pain 03/17/2021   Increased prostate specific antigen (PSA) velocity 12/14/2020   Stage 3a chronic kidney disease (CKD) (HCC) 12/14/2020   Hyperlipidemia LDL goal <70 08/09/2017   Encounter  for well adult exam with abnormal findings 07/29/2016   Mild aortic stenosis 07/29/2016   Insomnia 07/29/2016   Erectile dysfunction 03/30/2016   Anxiety disorder 01/06/2016   Benign prostatic hyperplasia with urinary frequency 01/06/2016   Essential hypertension 02/02/2012   Hypoglycemia 10-31-1945    Orientation RESPIRATION BLADDER Height & Weight     Self, Time, Situation, Place  Normal Incontinent, External catheter (External Urinary Catheter) Weight: 193 lb 5.5 oz (87.7 kg) Height:  5\' 11"  (180.3 cm)  BEHAVIORAL SYMPTOMS/MOOD NEUROLOGICAL BOWEL NUTRITION STATUS      Continent Diet (Please see discharge summary)  AMBULATORY STATUS COMMUNICATION OF NEEDS Skin   Extensive Assist Verbally Other (Comment) (dry,Ecchymosis,Arm,L,Erythema,Buttocks,Sacrum,R,Wound/Incision LDAs,PI coccyx medial stage 1,foam lift dresing ,every 3 days,Incision closed,chest,other,Incision closed Leg,R,Incision closed Leg,L)                       Personal Care Assistance Level of Assistance  Bathing, Feeding, Dressing Bathing Assistance: Maximum assistance Feeding assistance: Independent Dressing Assistance: Maximum assistance     Functional Limitations Info  Sight, Hearing, Speech Sight Info: Adequate Hearing Info: Adequate Speech Info: Adequate    SPECIAL CARE FACTORS FREQUENCY  PT (By licensed PT), OT (By licensed OT)     PT Frequency: 5x min weekly OT Frequency: 5x min weekly            Contractures Contractures Info: Not present    Additional Factors Info  Code Status, Allergies, Psychotropic, Insulin Sliding Scale Code Status Info: FULL Allergies Info: NKA Psychotropic Info: zolpidem (AMBIEN) tablet 5 mg daily at bedtime,PARoxetine (PAXIL) tablet 40 mg BH-each  morning, Insulin Sliding Scale Info: insulin aspart (novoLOG) injection 0-15 Units 3 times daily with meals       Current Medications (11/15/2022):  This is the current hospital active medication list Current  Facility-Administered Medications  Medication Dose Route Frequency Provider Last Rate Last Admin   0.9 %  sodium chloride infusion  250 mL Intravenous PRN Loreli Slot, MD       acetaminophen (TYLENOL) tablet 1,000 mg  1,000 mg Oral Q6H Loreli Slot, MD   1,000 mg at 11/15/22 1134   Or   acetaminophen (TYLENOL) 160 MG/5ML solution 1,000 mg  1,000 mg Per Tube Q6H Loreli Slot, MD   1,000 mg at 11/11/22 0546   alum & mag hydroxide-simeth (MAALOX/MYLANTA) 200-200-20 MG/5ML suspension 15 mL  15 mL Oral Q6H PRN Loreli Slot, MD       amiodarone (PACERONE) tablet 200 mg  200 mg Oral BID Loreli Slot, MD   200 mg at 11/15/22 0935   aspirin EC tablet 325 mg  325 mg Oral Daily Loreli Slot, MD   325 mg at 11/15/22 6644   Or   aspirin chewable tablet 324 mg  324 mg Per Tube Daily Loreli Slot, MD       atorvastatin (LIPITOR) tablet 80 mg  80 mg Oral QPM Loreli Slot, MD   80 mg at 11/14/22 1910   bisacodyl (DULCOLAX) EC tablet 10 mg  10 mg Oral Daily Loreli Slot, MD   10 mg at 11/14/22 0347   Or   bisacodyl (DULCOLAX) suppository 10 mg  10 mg Rectal Daily Loreli Slot, MD       docusate sodium (COLACE) capsule 200 mg  200 mg Oral Daily Loreli Slot, MD   200 mg at 11/14/22 0913   fenofibrate tablet 160 mg  160 mg Oral Daily Loreli Slot, MD   160 mg at 11/15/22 0935   fluticasone (FLONASE) 50 MCG/ACT nasal spray 2 spray  2 spray Each Nare Daily Loreli Slot, MD       furosemide (LASIX) tablet 40 mg  40 mg Oral Daily Loreli Slot, MD   40 mg at 11/15/22 0936   gabapentin (NEURONTIN) capsule 300 mg  300 mg Oral QHS Loreli Slot, MD   300 mg at 11/14/22 2201   hydrALAZINE (APRESOLINE) tablet 25 mg  25 mg Oral Q8H Loreli Slot, MD   25 mg at 11/15/22 1437   insulin aspart (novoLOG) injection 0-15 Units  0-15 Units Subcutaneous TID WC Loreli Slot, MD   2  Units at 11/15/22 1134   losartan (COZAAR) tablet 100 mg  100 mg Oral Daily Loreli Slot, MD   100 mg at 11/15/22 0935   magnesium hydroxide (MILK OF MAGNESIA) suspension 30 mL  30 mL Oral Daily PRN Loreli Slot, MD       metoprolol tartrate (LOPRESSOR) tablet 25 mg  25 mg Oral BID Loreli Slot, MD   25 mg at 11/15/22 0935   Or   metoprolol tartrate (LOPRESSOR) 25 mg/10 mL oral suspension 25 mg  25 mg Per Tube BID Loreli Slot, MD       metoprolol tartrate (LOPRESSOR) injection 2.5-5 mg  2.5-5 mg Intravenous Q2H PRN Loreli Slot, MD   2.5 mg at 11/14/22 1606   ondansetron (ZOFRAN) injection 4 mg  4 mg Intravenous Q6H PRN Loreli Slot, MD  Oral care mouth rinse  15 mL Mouth Rinse PRN Loreli Slot, MD       oxyCODONE (Oxy IR/ROXICODONE) immediate release tablet 5-10 mg  5-10 mg Oral Q3H PRN Loreli Slot, MD   5 mg at 11/14/22 1633   pantoprazole (PROTONIX) EC tablet 40 mg  40 mg Oral Daily Loreli Slot, MD   40 mg at 11/15/22 0935   PARoxetine (PAXIL) tablet 40 mg  40 mg Oral Vernell Morgans, MD   40 mg at 11/15/22 0618   potassium chloride (KLOR-CON) CR tablet 20 mEq  20 mEq Oral Daily Loreli Slot, MD   20 mEq at 11/15/22 0936   sodium chloride flush (NS) 0.9 % injection 3 mL  3 mL Intravenous Q12H Loreli Slot, MD   3 mL at 11/15/22 0937   sodium chloride flush (NS) 0.9 % injection 3 mL  3 mL Intravenous PRN Loreli Slot, MD       traMADol Janean Sark) tablet 50-100 mg  50-100 mg Oral Q4H PRN Loreli Slot, MD   50 mg at 11/12/22 2133   zolpidem (AMBIEN) tablet 5 mg  5 mg Oral QHS Loreli Slot, MD   5 mg at 11/14/22 2201     Discharge Medications: Please see discharge summary for a list of discharge medications.  Relevant Imaging Results:  Relevant Lab Results:   Additional Information [id number]  Delilah Shan, LCSWA

## 2022-11-15 NOTE — Progress Notes (Addendum)
      [address]       [place] [address]             [phone]      5 Days Post-Op Procedure(s) (LRB): CORONARY ARTERY BYPASS GRAFTING (CABG) X5 USING LEFT INTERNAL MAMMARY ARTERY (LIMA) AND ENDOSCOPICALLY HARVESTED RIGHT AND LEFT GREATER SAPHENOUS VEIN (N/A) TRANSESOPHAGEAL ECHOCARDIOGRAM (N/A)  Subjective:  Patient up on bedside commode.  No specific complaints.  States he ambulated around unit this morning.  Appetite remains marginal due to not very hungry and some mild nausea  Objective: Vital signs in last 24 hours: Temp:  [99 F (37.2 C)-99.2 F (37.3 C)] 99.2 F (37.3 C) (07/02 0015) Pulse Rate:  [65-94] 82 (07/02 0700) Cardiac Rhythm: Normal sinus rhythm (07/01 2000) Resp:  [16-68] 24 (07/02 0700) BP: (124-174)/(64-103) 147/86 (07/02 0621) SpO2:  [94 %-99 %] 98 % (07/02 0700) Weight:  [87.7 kg] 87.7 kg (07/02 0500)  Intake/Output from previous day: 07/01 0701 - 07/02 0700 In: 232.5 [P.O.:120; I.V.:112.5] Out: 3175 [Urine:3175]  General appearance: alert, cooperative, and no distress Heart: regular rate and rhythm and + systolic murmur Lungs: diminished breath sounds bibasilar Abdomen: soft, non-tender; bowel sounds normal; no masses,  no organomegaly Extremities: edema + mild pitting Wound: clean and dry  Lab Results: Recent Labs    11/14/22 0609 11/15/22 0032  WBC 5.9 7.8  HGB 8.6* 10.2*  HCT 26.1* 29.8*  PLT 71* 77*   BMET:  Recent Labs    11/14/22 0609 11/15/22 0032  NA 141 143  K 3.6 3.6  CL 109 108  CO2 25 24  GLUCOSE 110* 69*  BUN 30* 27*  CREATININE 1.49* 1.40*  CALCIUM 7.9* 8.3*    PT/INR: No results for input(s): "LABPROT", "INR" in the last 72 hours. ABG    Component Value Date/Time   PHART 7.355 11/12/2022 0908   HCO3 23.7 11/12/2022 0908   TCO2 25 11/12/2022 0908   ACIDBASEDEF 2.0 11/12/2022 0908   O2SAT 95 11/12/2022 0908   CBG (last 3)  Recent Labs    11/14/22 2157 11/15/22 0357 11/15/22 0423   GLUCAP 88 60* 78    Assessment/Plan: S/P Procedure(s) (LRB): CORONARY ARTERY BYPASS GRAFTING (CABG) X5 USING LEFT INTERNAL MAMMARY ARTERY (LIMA) AND ENDOSCOPICALLY HARVESTED RIGHT AND LEFT GREATER SAPHENOUS VEIN (N/A) TRANSESOPHAGEAL ECHOCARDIOGRAM (N/A)  CV- PAF, maintaining NSR, + HTN- Lopressor increased to 25 mg BID last evening, home hydralazine resumed...will decrease Amiodarone in setting of nausea.. monitor BP may need to resume home Cozaar prior to d/c pending creatinine level Pulm- off oxygen, CXR with continued atelectasis bilateral, no significant effusions,continue IS Renal- CKD Stage IIIb, creatinine stable at 1.4, good response to IV lasix yesterday, will repeat, increase potassium supplementation Dysphagia-SLP following, remains on Dysphagia 2 diet DM- hypoglycemic overnight, low sugars mostly yesterday... will continue SSIP decrease Semglee to daily.. can adjust as oral intake improves Expected post operative blood loss anemia- Hgb improved to 10.2 Deconditioning- PT/OT evals recommended SNF placement Dispo- patient is frail, deconditioned, maintaining NSR, responding well to diuretics continue.. possibly transfer to 4E   LOS: 5 days    Lowella Dandy, PA-C 11/15/2022 Patient seen and examined, agree with above Looks much better today although still generally weak He was able to ambulate around unit today Diuresed well CXR much improved Will transfer to 4E  Viviann Spare C. Dorris Fetch, MD [facility] [phone]

## 2022-11-15 NOTE — Progress Notes (Signed)
EVENING ROUNDS NOTE :     [address]       [place] [address]             [phone]                 5 Days Post-Op Procedure(s) (LRB): CORONARY ARTERY BYPASS GRAFTING (CABG) X5 USING LEFT INTERNAL MAMMARY ARTERY (LIMA) AND ENDOSCOPICALLY HARVESTED RIGHT AND LEFT GREATER SAPHENOUS VEIN (N/A) TRANSESOPHAGEAL ECHOCARDIOGRAM (N/A)   Total Length of Stay:  LOS: 5 days  Events:   No events Awaiting floor bed    BP (!) 113/57   Pulse 73   Temp 98.9 F (37.2 C) (Oral)   Resp 16   Ht 5\' 11"  (1.803 m)   Wt 87.7 kg   SpO2 95%   BMI 26.97 kg/m          sodium chloride      I/O last 3 completed shifts: In: 232.5 [P.O.:120; I.V.:112.5] Out: 3625 [Urine:3625]      Latest Ref Rng & Units 11/15/2022   12:32 AM 11/14/2022    6:09 AM 11/13/2022   12:53 PM  CBC  WBC 4.0 - 10.5 K/uL 7.8  5.9  8.7   Hemoglobin 13.0 - 17.0 g/dL 91.4  8.6  8.9   Hematocrit 39.0 - 52.0 % 29.8  26.1  27.3   Platelets 150 - 400 K/uL 77  71  66        Latest Ref Rng & Units 11/15/2022   12:32 AM 11/14/2022    6:09 AM 11/13/2022    5:49 AM  BMP  Glucose 70 - 99 mg/dL 69  782  956   BUN 8 - 23 mg/dL 27  30  33   Creatinine 0.61 - 1.24 mg/dL 2.13  0.86  5.78   Sodium 135 - 145 mmol/L 143  141  138   Potassium 3.5 - 5.1 mmol/L 3.6  3.6  3.7   Chloride 98 - 111 mmol/L 108  109  107   CO2 22 - 32 mmol/L 24  25  25    Calcium 8.9 - 10.3 mg/dL 8.3  7.9  7.9     ABG    Component Value Date/Time   PHART 7.355 11/12/2022 0908   PCO2ART 42.5 11/12/2022 0908   PO2ART 79 (L) 11/12/2022 0908   HCO3 23.7 11/12/2022 0908   TCO2 25 11/12/2022 0908   ACIDBASEDEF 2.0 11/12/2022 0908   O2SAT 95 11/12/2022 0908       Brynda Greathouse, MD 11/15/2022 4:21 PM

## 2022-11-15 NOTE — TOC Initial Note (Addendum)
Transition of Care [facility]) - Initial/Assessment Note    Patient Details  Name: Collin Bridges MRN: [id number] Date of Birth: 05-Mar-1946  Transition of Care [facility]) CM/SW Contact:    Delilah Shan, LCSWA Phone Number: 11/15/2022, 3:17 PM  Clinical Narrative:                  CSW received consult for possible SNF placement at time of discharge. CSW spoke with patient at bedside regarding PT recommendation of SNF placement at time of discharge.  Patient expressed understanding of PT recommendation and is agreeable to SNF placement at time of discharge. Patient gave CSW permission to fax out initial referral near the [facility] area for possible SNF placement . CSW discussed insurance authorization process and will provide Medicare SNF ratings list with accepted SNF bed offers when available. No further questions reported at this time. Patients passr currently pending. CSW to continue to follow and assist with discharge planning needs.   Expected Discharge Plan: Skilled Nursing Facility Barriers to Discharge: Continued Medical Work up   Patient Goals and CMS Choice Patient states their goals for this hospitalization and ongoing recovery are:: SNF CMS Medicare.gov Compare Post Acute Care list provided to:: Patient Choice offered to / list presented to : Patient      Expected Discharge Plan and Services In-house Referral: Clinical Social Work     Living arrangements for the past 2 months: Single Family Home                                      Prior Living Arrangements/Services Living arrangements for the past 2 months: Single Family Home Lives with:: Self Patient language and need for interpreter reviewed:: Yes Do you feel safe going back to the place where you live?: No   SNF  Need for Family Participation in Patient Care: Yes (Comment) Care giver support system in place?: Yes (comment)   Criminal Activity/Legal Involvement Pertinent to Current  Situation/Hospitalization: No - Comment as needed  Activities of Daily Living      Permission Sought/Granted Permission sought to share information with : Case Manager, Family Supports, Magazine features editor Permission granted to share information with : Yes, Verbal Permission Granted  Share Information with NAME: Lynnette Caffey  Permission granted to share info w AGENCY: SNF  Permission granted to share info w Relationship: significant other  Permission granted to share info w Contact Information: Lynnette Caffey [phone]  Emotional Assessment Appearance:: Appears stated age Attitude/Demeanor/Rapport: Gracious Affect (typically observed): Calm Orientation: : Oriented to Self, Oriented to Place, Oriented to  Time, Oriented to Situation Alcohol / Substance Use: Not Applicable Psych Involvement: No (comment)  Admission diagnosis:  Coronary artery disease [I25.10] Patient Active Problem List   Diagnosis Date Noted   Coronary artery disease 11/10/2022   CAD (coronary artery disease) 10/31/2022   Aortic atherosclerosis (HCC) 10/05/2022   NSTEMI (non-ST elevated myocardial infarction) (HCC) 07/12/2022   Abnormal cardiac CT angiography 07/12/2022   Severe sepsis (HCC) 07/09/2022   Chest pain 07/09/2022   Elevated troponin 07/09/2022   Depression 07/09/2022   Allergic rhinitis 07/09/2022   Chronic diastolic CHF (congestive heart failure) (HCC) 07/09/2022   CAP (community acquired pneumonia) 07/08/2022   Left leg cellulitis 06/03/2022   Cat scratch of left lower leg 05/18/2022   Skin infection 05/18/2022   DM2 (diabetes mellitus, type 2) (HCC) 12/01/2021   RLS (restless legs syndrome) 12/01/2021  Vitamin D deficiency 06/03/2021   History of stroke 04/25/2021   CVA (cerebral vascular accident) (HCC) 04/07/2021   Brain mass 03/17/2021   Headache 03/17/2021   Cerebral atrophy (HCC) 03/17/2021   Stroke of right basal ganglia (HCC) 03/17/2021   Forehead pain 03/17/2021   Increased  prostate specific antigen (PSA) velocity 12/14/2020   Stage 3a chronic kidney disease (CKD) (HCC) 12/14/2020   Hyperlipidemia LDL goal <70 08/09/2017   Encounter for well adult exam with abnormal findings 07/29/2016   Mild aortic stenosis 07/29/2016   Insomnia 07/29/2016   Erectile dysfunction 03/30/2016   Anxiety disorder 01/06/2016   Benign prostatic hyperplasia with urinary frequency 01/06/2016   Essential hypertension 02/02/2012   Hypoglycemia 01/07/1946   PCP:  Corwin Levins, MD Pharmacy:   [facility] - [place], [place] - [phone] [address] [place] [address] Phone: [phone] Fax: [phone]  [id number] [place], [place] - [address] AT [facility] OF [address] [place] [address] Phone: [phone] Fax: [phone]  [facility] [phone] - [place], [place] - [address] AT [facility] OF [address] [place] [address] Phone: [phone] Fax: [phone]  [place] [facility] [address] [place] [address] Phone: [phone] Fax: [phone]     Social Determinants of Health (SDOH) Social History: SDOH Screenings   Food Insecurity: No Food Insecurity (09/29/2022)  Housing: Low Risk  (09/29/2022)  Transportation Needs: No Transportation Needs (09/29/2022)  Utilities: Not At Risk (07/09/2022)  Depression (PHQ2-9): High Risk (07/18/2022)  Financial Resource Strain: Low Risk  (09/29/2022)  Physical Activity: Insufficiently Active (09/29/2022)  Social Connections: Socially Isolated (09/29/2022)  Stress: No Stress Concern Present (09/29/2022)  Tobacco Use: Medium Risk (11/14/2022)   SDOH Interventions:     Readmission Risk Interventions     No data to display

## 2022-11-15 NOTE — Progress Notes (Cosign Needed Addendum)
RE: Collin Bridges. [name]  Date of Birth: 19-Sep-1945  Date: 11/15/2022  To Whom It May Concern:  Please be advised that the above-named patient will require a short-term nursing home stay - anticipated 30 days or less for rehabilitation and strengthening. The plan is for return home.

## 2022-11-15 NOTE — Progress Notes (Signed)
Hypoglycemic Event  CBG: 60  Treatment: 4 oz juice/soda  Symptoms: None  Follow-up CBG: Time:0423 CBG Result:78   Possible Reasons for Event: Inadequate meal intake and Medication regimen: 20u semglee twice daily (was held last night by Elicia Lamp)  Comments/MD notified:will pass on to day team to address with MD    Peyton Bottoms

## 2022-11-16 LAB — CBC
HCT: 28.4 % — ABNORMAL LOW (ref 39.0–52.0)
Hemoglobin: 9.4 g/dL — ABNORMAL LOW (ref 13.0–17.0)
MCH: 29.6 pg (ref 26.0–34.0)
MCHC: 33.1 g/dL (ref 30.0–36.0)
MCV: 89.3 fL (ref 80.0–100.0)
Platelets: 102 10*3/uL — ABNORMAL LOW (ref 150–400)
RBC: 3.18 MIL/uL — ABNORMAL LOW (ref 4.22–5.81)
RDW: 15.1 % (ref 11.5–15.5)
WBC: 7.1 10*3/uL (ref 4.0–10.5)
nRBC: 0 % (ref 0.0–0.2)

## 2022-11-16 LAB — BASIC METABOLIC PANEL
Anion gap: 10 (ref 5–15)
BUN: 23 mg/dL (ref 8–23)
CO2: 25 mmol/L (ref 22–32)
Calcium: 8.3 mg/dL — ABNORMAL LOW (ref 8.9–10.3)
Chloride: 104 mmol/L (ref 98–111)
Creatinine, Ser: 1.33 mg/dL — ABNORMAL HIGH (ref 0.61–1.24)
GFR, Estimated: 55 mL/min — ABNORMAL LOW (ref >60)
Glucose, Bld: 111 mg/dL — ABNORMAL HIGH (ref 70–99)
Potassium: 3.6 mmol/L (ref 3.5–5.1)
Sodium: 139 mmol/L (ref 135–145)

## 2022-11-16 LAB — GLUCOSE, CAPILLARY
Glucose-Capillary: 122 mg/dL — ABNORMAL HIGH (ref 70–99)
Glucose-Capillary: 163 mg/dL — ABNORMAL HIGH (ref 70–99)
Glucose-Capillary: 135 mg/dL — ABNORMAL HIGH (ref 70–99)
Glucose-Capillary: 102 mg/dL — ABNORMAL HIGH (ref 70–99)

## 2022-11-16 MED ORDER — CHLORHEXIDINE GLUCONATE CLOTH 2 % EX PADS
6.0000 | MEDICATED_PAD | Freq: Every day | CUTANEOUS | Status: DC
  Administered 2022-11-16: 6 via TOPICAL

## 2022-11-16 NOTE — Progress Notes (Signed)
Speech Language Pathology Treatment: Dysphagia  Patient Details Name: Collin Bridges MRN: [id number] DOB: July 24, 1945 Today's Date: 11/16/2022 Time: 4098-1191 SLP Time Calculation (min) (ACUTE ONLY): 10 min  Assessment / Plan / Recommendation Clinical Impression  Pt is more interactive and with improved affect today. Sitting in recliner.  He demonstrates improved ability to [facility] and attend to regular solids. Drinks thin liquids with no s/s of aspiration. He declined placement of partials but was able to chew solids despite their absence. Dysphagia has resolved. Recommend advancing diet to regular solids, thin liquids. No further SLP f/u is needed - will sign off.   HPI HPI: Patient is a 77 y.o. male with PMH: CAD, HTN, anxiety, BPH, HLD, CKD, CVA, DM-2, RLS. He presented to the hospital on 11/10/22 for CABG x5. He was intubated 6/27 for the procedure and extubated the following day, 6/28 at 0820. RN reported that patient had coughing during and after taking pills in pudding and he expectorated phlegm but also a whole pill. Pt has a hx of CVAs; he is a pt of Dr. Marlis Edelson, neurology.  Last visit 08/2022 scored 22/36 per MOCA. No focal cranial nerve deficits.      SLP Plan  All goals met      Recommendations for follow up therapy are one component of a multi-disciplinary discharge planning process, led by the attending physician.  Recommendations may be updated based on patient status, additional functional criteria and insurance authorization.    Recommendations  Diet recommendations: Regular;Thin liquid Liquids provided via: Cup;Straw Medication Administration: Whole meds with liquid Supervision: Patient able to self feed Postural Changes and/or Swallow Maneuvers: Seated upright 90 degrees                  Oral care BID     Dysphagia, unspecified (R13.10)     All goals met    Collin Bridges L. Collin Frederic, MA CCC/SLP Clinical Specialist - Acute Care SLP Acute Rehabilitation  Services Office number [phone]  Collin Bridges Collin Bridges  11/16/2022, 10:25 AM

## 2022-11-16 NOTE — Progress Notes (Addendum)
      [address]       [place] [address]             [phone]      6 Days Post-Op Procedure(s) (LRB): CORONARY ARTERY BYPASS GRAFTING (CABG) X5 USING LEFT INTERNAL MAMMARY ARTERY (LIMA) AND ENDOSCOPICALLY HARVESTED RIGHT AND LEFT GREATER SAPHENOUS VEIN (N/A) TRANSESOPHAGEAL ECHOCARDIOGRAM (N/A)  Subjective:  Sitting up in chair.  No specific complaints.  Trying to eat some of his breakfast.  Objective: Vital signs in last 24 hours: Temp:  [98 F (36.7 C)-99 F (37.2 C)] 98.9 F (37.2 C) (07/02 2300) Pulse Rate:  [70-86] 86 (07/03 0700) Cardiac Rhythm: Normal sinus rhythm (07/02 1915) Resp:  [15-27] 18 (07/03 0700) BP: (107-160)/(56-91) 134/64 (07/03 0700) SpO2:  [92 %-97 %] 95 % (07/03 0700) Weight:  [85.1 kg] 85.1 kg (07/03 0500)  Intake/Output from previous day: 07/02 0701 - 07/03 0700 In: 6 [I.V.:6] Out: 1625 [Urine:1625]  General appearance: alert, cooperative, and no distress Heart: regular rate and rhythm Lungs: clear to auscultation bilaterally Abdomen: soft, non-tender; bowel sounds normal; no masses,  no organomegaly Extremities: edema + pitting Wound: clean and dry  Lab Results: Recent Labs    11/15/22 0032 11/16/22 0055  WBC 7.8 7.1  HGB 10.2* 9.4*  HCT 29.8* 28.4*  PLT 77* 102*   BMET:  Recent Labs    11/15/22 0032 11/16/22 0055  NA 143 139  K 3.6 3.6  CL 108 104  CO2 24 25  GLUCOSE 69* 111*  BUN 27* 23  CREATININE 1.40* 1.33*  CALCIUM 8.3* 8.3*    PT/INR: No results for input(s): "LABPROT", "INR" in the last 72 hours. ABG    Component Value Date/Time   PHART 7.355 11/12/2022 0908   HCO3 23.7 11/12/2022 0908   TCO2 25 11/12/2022 0908   ACIDBASEDEF 2.0 11/12/2022 0908   O2SAT 95 11/12/2022 0908   CBG (last 3)  Recent Labs    11/15/22 1622 11/15/22 2120 11/16/22 0700  GLUCAP 95 115* 122*    Assessment/Plan: S/P Procedure(s) (LRB): CORONARY ARTERY BYPASS GRAFTING (CABG) X5 USING LEFT INTERNAL MAMMARY  ARTERY (LIMA) AND ENDOSCOPICALLY HARVESTED RIGHT AND LEFT GREATER SAPHENOUS VEIN (N/A) TRANSESOPHAGEAL ECHOCARDIOGRAM (N/A)  CV- NSR, BP controlled- Amiodarone, Hydralazine, Cozaar, Lopressor Pulm- off oxygen, no acute issues,  continue IS Renal-creatinine improving, down to 1.33, good response to IV Lasix, weight is trending down, continue potassium.Marland Kitchen add TED hose Dysphagia- SLP following, currently on dysphagia 2 diet, will advance when appropriate DM- sugars improved, continue reduced semglee, SSIP Deconditioning- will require SNF placement Dispo- patient making progress, good response to diuretics, remains in NSR.. can hopefully advance diet soon, weak/deconditioned... needs SNF.Marland Kitchen likely Friday if bed can be arranged.. if not plan for Monday   LOS: 6 days   Lowella Dandy, PA-C 11/16/2022  Patient seen and examined, plan as d/w Mrs. Barrett as outlined above Continues to progress Awaiting bed on 4E  Lenay Lovejoy C. Dorris Fetch, MD [facility] [phone]

## 2022-11-16 NOTE — Progress Notes (Addendum)
Occupational Therapy Treatment Patient Details Name: Collin Bridges MRN: [id number] DOB: 1946/02/23 Today's Date: 11/16/2022   History of present illness 77 yo male admitted 6/27 for CABG x 5. PMHX: CAD, HTN, anxiety, BPH, HLD, CKD, CVA, T2DM, RLS   OT comments  Pt progressing towards goals this session, needing min cues for precautions, pt needing min guard-max A for ALDs, and mod A for transfers with RW. Unable to obtain good SpO2 reading during ambulation, however pt reports 8/10 fatigue, SpO2 high 90's on RA  when returned to chair post-ambulation. Pt able to complete seated UB and LB dressing using compensatory strategies, will benefit from continued reinforcement. Pt presenting with impairments listed below, will follow acutely. Patient will benefit from intensive inpatient follow up therapy, <3 hours/day to maximize safety/ind with ADLs/functional mobility.    Recommendations for follow up therapy are one component of a multi-disciplinary discharge planning process, led by the attending physician.  Recommendations may be updated based on patient status, additional functional criteria and insurance authorization.    Assistance Recommended at Discharge Frequent or constant Supervision/Assistance  Patient can return home with the following  A lot of help with walking and/or transfers;A lot of help with bathing/dressing/bathroom;Assistance with cooking/housework;Direct supervision/assist for medications management;Direct supervision/assist for financial management;Assist for transportation;Help with stairs or ramp for entrance   Equipment Recommendations  Other (comment) (defer)    Recommendations for Other Services PT consult    Precautions / Restrictions Precautions Precautions: Sternal;Fall Precaution Booklet Issued: No (administered in previous session) Precaution Comments: watch O2 Restrictions Weight Bearing Restrictions: Yes (Sternal precautions) RUE Weight Bearing: Non  weight bearing (Sternal precautions) LUE Weight Bearing: Non weight bearing (Sternal precautions) Other Position/Activity Restrictions: sternal precautions       Mobility Bed Mobility               General bed mobility comments: OOB in chair upon arrival and departure    Transfers Overall transfer level: Needs assistance Equipment used: Rolling walker (2 wheels) Transfers: Sit to/from Stand Sit to Stand: Mod assist                 Balance Overall balance assessment: Needs assistance Sitting-balance support: No upper extremity supported, Feet supported Sitting balance-Leahy Scale: Fair     Standing balance support: Bilateral upper extremity supported, During functional activity Standing balance-Leahy Scale: Poor Standing balance comment: relaint on external support                           ADL either performed or assessed with clinical judgement   ADL Overall ADL's : Needs assistance/impaired     Grooming: Oral care;Min guard;Standing Grooming Details (indicate cue type and reason): standing at sink Upper Body Bathing: Sitting;Moderate assistance   Lower Body Bathing: Sitting/lateral leans;Moderate assistance   Upper Body Dressing : Moderate assistance;Sitting   Lower Body Dressing: Moderate assistance;Sitting/lateral leans   Toilet Transfer: Moderate assistance;Ambulation;Rolling walker (2 wheels)   Toileting- Clothing Manipulation and Hygiene: Maximal assistance       Functional mobility during ADLs: Moderate assistance;Rolling walker (2 wheels)      Extremity/Trunk Assessment Upper Extremity Assessment Upper Extremity Assessment: Generalized weakness   Lower Extremity Assessment Lower Extremity Assessment: Defer to PT evaluation        Vision   Vision Assessment?: No apparent visual deficits   Perception Perception Perception: Not tested   Praxis Praxis Praxis: Not tested    Cognition Arousal/Alertness:  Awake/alert Behavior During Therapy: Flat  affect Overall Cognitive Status: Impaired/Different from baseline Area of Impairment: Memory, Problem solving                     Memory: Decreased recall of precautions       Problem Solving: Slow processing General Comments: more alert compared to evaluation, still with noted slowed processing        Exercises      Shoulder Instructions       General Comments SpO2 with poor wave pleth, in 90's on RA when reading well    Pertinent Vitals/ Pain       Pain Assessment Pain Assessment: No/denies pain  Home Living                                          Prior Functioning/Environment              Frequency  Min 1X/week        Progress Toward Goals  OT Goals(current goals can now be found in the care plan section)  Progress towards OT goals: Progressing toward goals  Acute Rehab OT Goals Patient Stated Goal: none stated OT Goal Formulation: With patient Time For Goal Achievement: 11/28/22 Potential to Achieve Goals: Good ADL Goals Pt Will Perform Upper Body Dressing: sitting;with min assist Pt Will Perform Lower Body Dressing: sitting/lateral leans;sit to/from stand;with min assist Pt Will Transfer to Toilet: ambulating;regular height toilet;with min guard assist Pt Will Perform Tub/Shower Transfer: Tub transfer;Shower transfer;ambulating;with min assist  Plan Discharge plan remains appropriate;Frequency remains appropriate    Co-evaluation                 AM-PAC OT "6 Clicks" Daily Activity     Outcome Measure   Help from another person eating meals?: A Little Help from another person taking care of personal grooming?: A Little Help from another person toileting, which includes using toliet, bedpan, or urinal?: A Lot Help from another person bathing (including washing, rinsing, drying)?: A Lot Help from another person to put on and taking off regular upper body clothing?: A  Lot Help from another person to put on and taking off regular lower body clothing?: A Lot 6 Click Score: 14    End of Session Equipment Utilized During Treatment: Rolling walker (2 wheels)  OT Visit Diagnosis: Muscle weakness (generalized) (M62.81);Other abnormalities of gait and mobility (R26.89)   Activity Tolerance Patient tolerated treatment well   Patient Left in chair;with call bell/phone within reach;with chair alarm set   Nurse Communication Mobility status        Time: [phone] OT Time Calculation (min): 31 min  Charges: OT General Charges $OT Visit: 1 Visit OT Treatments $Self Care/Home Management : 8-22 mins $Therapeutic Activity: 8-22 mins  Carver Fila, OTD, OTR/L SecureChat Preferred Acute Rehab [phone]   Carver Fila Koonce 11/16/2022, 9:38 AM

## 2022-11-17 ENCOUNTER — Inpatient Hospital Stay (HOSPITAL_COMMUNITY): Payer: 59

## 2022-11-17 LAB — BASIC METABOLIC PANEL
Anion gap: 7 (ref 5–15)
BUN: 20 mg/dL (ref 8–23)
CO2: 26 mmol/L (ref 22–32)
Calcium: 8.3 mg/dL — ABNORMAL LOW (ref 8.9–10.3)
Chloride: 104 mmol/L (ref 98–111)
Creatinine, Ser: 1.37 mg/dL — ABNORMAL HIGH (ref 0.61–1.24)
GFR, Estimated: 53 mL/min — ABNORMAL LOW (ref >60)
Glucose, Bld: 122 mg/dL — ABNORMAL HIGH (ref 70–99)
Potassium: 3.8 mmol/L (ref 3.5–5.1)
Sodium: 137 mmol/L (ref 135–145)

## 2022-11-17 LAB — GLUCOSE, CAPILLARY
Glucose-Capillary: 125 mg/dL — ABNORMAL HIGH (ref 70–99)
Glucose-Capillary: 119 mg/dL — ABNORMAL HIGH (ref 70–99)
Glucose-Capillary: 137 mg/dL — ABNORMAL HIGH (ref 70–99)
Glucose-Capillary: 115 mg/dL — ABNORMAL HIGH (ref 70–99)
Glucose-Capillary: 110 mg/dL — ABNORMAL HIGH (ref 70–99)

## 2022-11-17 MED ORDER — METFORMIN HCL ER 500 MG PO TB24
500.0000 mg | ORAL_TABLET | Freq: Every day | ORAL | Status: DC
  Administered 2022-11-18 – 2022-11-21 (×4): 500 mg via ORAL
  Filled 2022-11-17 (×4): qty 1

## 2022-11-17 NOTE — Progress Notes (Signed)
Physical Therapy Treatment Patient Details Name: Collin Bridges MRN: [id number] DOB: Feb 10, 1946 Today's Date: 11/17/2022   History of Present Illness 77 yo male admitted 6/27 for CABG x 5. PMHX: CAD, HTN, anxiety, BPH, HLD, CKD, CVA, T2DM, RLS    PT Comments  Pt received in supine and agreeable to session. Pt able to sit to EOB with min A and cues to maintain precautions. Pt requires mod A to stand without UE support due to posterior lean and weakness. Pt able to increase gait distance, however demonstrates short steps and slight unsteadiness with RW support. Pt able to briefly improve steps and posture with cues, however requires consistent cues to maintain.  Pt continues to benefit from PT services to progress toward functional mobility goals.      Assistance Recommended at Discharge Frequent or constant Supervision/Assistance  If plan is discharge home, recommend the following:  Can travel by private vehicle    A lot of help with walking and/or transfers;A lot of help with bathing/dressing/bathroom;Assistance with cooking/housework;Assist for transportation;Help with stairs or ramp for entrance   No  Equipment Recommendations  Rolling walker (2 wheels);BSC/3in1    Recommendations for Other Services       Precautions / Restrictions Precautions Precautions: Sternal;Fall Precaution Comments: watch O2 Restrictions Weight Bearing Restrictions: Yes RUE Weight Bearing: Non weight bearing LUE Weight Bearing: Non weight bearing Other Position/Activity Restrictions: sternal precautions     Mobility  Bed Mobility Overal bed mobility: Needs Assistance Bed Mobility: Supine to Sit     Supine to sit: Min assist     General bed mobility comments: Min A for trunk elevation to maintain sternal precautions    Transfers Overall transfer level: Needs assistance Equipment used: Rolling walker (2 wheels) Transfers: Sit to/from Stand Sit to Stand: Mod assist            General transfer comment: Mod A for power up and anterior weight shift due to pt demonstrating posterior lean despite cues.    Ambulation/Gait Ambulation/Gait assistance: Min guard Gait Distance (Feet): 180 Feet Assistive device: Rolling walker (2 wheels) Gait Pattern/deviations: Decreased stride length, Narrow base of support, Trunk flexed, Step-through pattern, Decreased dorsiflexion - right, Decreased dorsiflexion - left Gait velocity: dec     General Gait Details: Pt demonstrating short step-through pattern. pt able to briefly improve upright posture and step length with cues.      Balance Overall balance assessment: Needs assistance Sitting-balance support: No upper extremity supported, Feet supported Sitting balance-Leahy Scale: Fair Sitting balance - Comments: sitting EOB   Standing balance support: Bilateral upper extremity supported, During functional activity, Reliant on assistive device for balance Standing balance-Leahy Scale: Poor Standing balance comment: reliant on RW support                            Cognition Arousal/Alertness: Awake/alert Behavior During Therapy: Flat affect Overall Cognitive Status: Within Functional Limits for tasks assessed                                          Exercises      General Comments        Pertinent Vitals/Pain Pain Assessment Pain Assessment: 0-10 Pain Score: 4  Pain Location: chest/incision Pain Descriptors / Indicators: Sore Pain Intervention(s): Monitored during session     PT Goals (current goals can now be found  in the care plan section) Acute Rehab PT Goals Patient Stated Goal: home PT Goal Formulation: With patient Time For Goal Achievement: 11/26/22 Potential to Achieve Goals: Good Progress towards PT goals: Progressing toward goals    Frequency    Min 1X/week      PT Plan Current plan remains appropriate       AM-PAC PT "6 Clicks" Mobility   Outcome  Measure  Help needed turning from your back to your side while in a flat bed without using bedrails?: A Little Help needed moving from lying on your back to sitting on the side of a flat bed without using bedrails?: A Little Help needed moving to and from a bed to a chair (including a wheelchair)?: A Little Help needed standing up from a chair using your arms (e.g., wheelchair or bedside chair)?: A Lot Help needed to walk in hospital room?: A Little Help needed climbing 3-5 steps with a railing? : Total 6 Click Score: 15    End of Session Equipment Utilized During Treatment: Gait belt Activity Tolerance: Patient tolerated treatment well Patient left: in chair;with call bell/phone within reach Nurse Communication: Mobility status PT Visit Diagnosis: Other abnormalities of gait and mobility (R26.89);Muscle weakness (generalized) (M62.81);Difficulty in walking, not elsewhere classified (R26.2)     Time: [id number] PT Time Calculation (min) (ACUTE ONLY): 28 min  Charges:    $Gait Training: 8-22 mins $Therapeutic Activity: 8-22 mins PT General Charges $$ ACUTE PT VISIT: 1 Visit                     Johny Shock, PTA Acute Rehabilitation Services Secure Chat Preferred  [phone]    Johny Shock 11/17/2022, 1:24 PM

## 2022-11-17 NOTE — Progress Notes (Addendum)
      [address]       [place] [address]             [phone]      7 Days Post-Op Procedure(s) (LRB): CORONARY ARTERY BYPASS GRAFTING (CABG) X5 USING LEFT INTERNAL MAMMARY ARTERY (LIMA) AND ENDOSCOPICALLY HARVESTED RIGHT AND LEFT GREATER SAPHENOUS VEIN (N/A) TRANSESOPHAGEAL ECHOCARDIOGRAM (N/A) Subjective: Pt states he is sore this morning.   Objective: Vital signs in last 24 hours: Temp:  [98 F (36.7 C)-98.4 F (36.9 C)] 98.4 F (36.9 C) (07/04 0743) Pulse Rate:  [68-93] 80 (07/04 0743) Cardiac Rhythm: Normal sinus rhythm (07/04 0350) Resp:  [18-29] 20 (07/04 0743) BP: (110-140)/(53-71) 131/68 (07/04 0743) SpO2:  [92 %-98 %] 93 % (07/04 0743) Weight:  [85 kg] 85 kg (07/04 0507)  Hemodynamic parameters for last 24 hours:    Intake/Output from previous day: 07/03 0701 - 07/04 0700 In: -  Out: 1550 [Urine:1550] Intake/Output this shift: No intake/output data recorded.  General appearance: alert, cooperative, and no distress Neurologic: intact Heart: regular rate and rhythm, S1, S2 normal, no murmur, click, rub or gallop Lungs: Diminished bibasilar Abdomen: soft, non-tender; bowel sounds normal; no masses,  no organomegaly Extremities: TED hose in place Wound: Clean and dry, no erythema or sign of infection  Lab Results: Recent Labs    11/15/22 0032 11/16/22 0055  WBC 7.8 7.1  HGB 10.2* 9.4*  HCT 29.8* 28.4*  PLT 77* 102*   BMET:  Recent Labs    11/16/22 0055 11/17/22 0047  NA 139 137  K 3.6 3.8  CL 104 104  CO2 25 26  GLUCOSE 111* 122*  BUN 23 20  CREATININE 1.33* 1.37*  CALCIUM 8.3* 8.3*    PT/INR: No results for input(s): "LABPROT", "INR" in the last 72 hours. ABG    Component Value Date/Time   PHART 7.355 11/12/2022 0908   HCO3 23.7 11/12/2022 0908   TCO2 25 11/12/2022 0908   ACIDBASEDEF 2.0 11/12/2022 0908   O2SAT 95 11/12/2022 0908   CBG (last 3)  Recent Labs    11/16/22 2114 11/17/22 0601 11/17/22 0735   GLUCAP 102* 125* 119*    Assessment/Plan: S/P Procedure(s) (LRB): CORONARY ARTERY BYPASS GRAFTING (CABG) X5 USING LEFT INTERNAL MAMMARY ARTERY (LIMA) AND ENDOSCOPICALLY HARVESTED RIGHT AND LEFT GREATER SAPHENOUS VEIN (N/A) TRANSESOPHAGEAL ECHOCARDIOGRAM (N/A)  CV: Hx of HTN, BP controlled on Amiodarone 200mg  BID, Hydralazine 25mg  Q8H, Losartan 100mg  daily, and Lopressor 25mg  BID.   Pulm: Saturating well on RA. CXR with bibasilar atelectasis and likely small bilateral pleural effusions. Encourage IS and ambulation.   GI: Dysphagia, SLP following. Now on regular diet and thin liquids. +BM yesterday.   Endo: CBGs controlled. Preop A1C 5.8. Continue SSI PRN, will restart home Metformin.   Renal: CKD stage IIIb, stable Cr 1.37. Good UO. +5lbs. Continue Lasix 40mg  daily and TED hose.   Expected postop ABLA: Last H/H 9.4/28.4, monitor  Thrombocytopenia: Last plt 102,000, improving.   Deconditioning: SNF at discharge. Continue work with PT/OT  Dispo: Pt will require SNF at discharge, hopefully can d/c to SNF tomorrow if a bed is available.    LOS: 7 days    Jenny Reichmann, PA-C 11/17/2022   Agree with above Dispo planning  Ezella Kell O Marquise Wicke

## 2022-11-17 NOTE — TOC Progression Note (Signed)
Transition of Care [facility]) - Progression Note    Patient Details  Name: Collin Bridges MRN: [id number] Date of Birth: 15-Feb-1946  [facility] CM/SW Contact  Carmina Miller, [place] Phone Number: 11/17/2022, 11:55 AM  Clinical Narrative:     CSW at bedside to provide pt with bed offers, pt requests time to go over offers, TOC will continue to follow.    Expected Discharge Plan: Skilled Nursing Facility Barriers to Discharge: Continued Medical Work up  Expected Discharge Plan and Services In-house Referral: Clinical Social Work     Living arrangements for the past 2 months: Single Family Home                                       Social Determinants of Health (SDOH) Interventions SDOH Screenings   Food Insecurity: No Food Insecurity (09/29/2022)  Housing: Low Risk  (09/29/2022)  Transportation Needs: No Transportation Needs (09/29/2022)  Utilities: Not At Risk (07/09/2022)  Depression (PHQ2-9): High Risk (07/18/2022)  Financial Resource Strain: Low Risk  (09/29/2022)  Physical Activity: Insufficiently Active (09/29/2022)  Social Connections: Socially Isolated (09/29/2022)  Stress: No Stress Concern Present (09/29/2022)  Tobacco Use: Medium Risk (11/14/2022)    Readmission Risk Interventions     No data to display

## 2022-11-17 NOTE — Care Management Important Message (Signed)
Important Message  Patient Details  Name: Collin Bridges MRN: [id number] Date of Birth: [birth year]   Medicare Important Message Given:  Yes     [name] 11/17/2022, 8:44 AM

## 2022-11-18 LAB — GLUCOSE, CAPILLARY
Glucose-Capillary: 129 mg/dL — ABNORMAL HIGH (ref 70–99)
Glucose-Capillary: 142 mg/dL — ABNORMAL HIGH (ref 70–99)
Glucose-Capillary: 115 mg/dL — ABNORMAL HIGH (ref 70–99)
Glucose-Capillary: 125 mg/dL — ABNORMAL HIGH (ref 70–99)

## 2022-11-18 LAB — BASIC METABOLIC PANEL
Anion gap: 8 (ref 5–15)
BUN: 22 mg/dL (ref 8–23)
CO2: 24 mmol/L (ref 22–32)
Calcium: 8.3 mg/dL — ABNORMAL LOW (ref 8.9–10.3)
Chloride: 103 mmol/L (ref 98–111)
Creatinine, Ser: 1.41 mg/dL — ABNORMAL HIGH (ref 0.61–1.24)
GFR, Estimated: 51 mL/min — ABNORMAL LOW (ref >60)
Glucose, Bld: 111 mg/dL — ABNORMAL HIGH (ref 70–99)
Potassium: 3.8 mmol/L (ref 3.5–5.1)
Sodium: 135 mmol/L (ref 135–145)

## 2022-11-18 LAB — CBC
HCT: 27 % — ABNORMAL LOW (ref 39.0–52.0)
Hemoglobin: 8.9 g/dL — ABNORMAL LOW (ref 13.0–17.0)
MCH: 29.8 pg (ref 26.0–34.0)
MCHC: 33 g/dL (ref 30.0–36.0)
MCV: 90.3 fL (ref 80.0–100.0)
Platelets: 120 10*3/uL — ABNORMAL LOW (ref 150–400)
RBC: 2.99 MIL/uL — ABNORMAL LOW (ref 4.22–5.81)
RDW: 15.4 % (ref 11.5–15.5)
WBC: 6.8 10*3/uL (ref 4.0–10.5)
nRBC: 0 % (ref 0.0–0.2)

## 2022-11-18 NOTE — Progress Notes (Signed)
Mobility Specialist Progress Note:   11/18/22 1657  Mobility  Activity Ambulated with assistance in hallway  Level of Assistance Contact guard assist, steadying assist  Assistive Device Front wheel walker  Distance Ambulated (ft) 225 ft  RUE Weight Bearing NWB  LUE Weight Bearing NWB  Activity Response Tolerated well  Mobility Referral Yes  $Mobility charge 1 Mobility  Mobility Specialist Start Time (ACUTE ONLY) 1625  Mobility Specialist Stop Time (ACUTE ONLY) 1640  Mobility Specialist Time Calculation (min) (ACUTE ONLY) 15 min    Pre Mobility: 73 HR, 94% SpO2 Post Mobility: 80 HR , 95% SpO2  Pt received in bed agreeable to mobility. +2 to stand d/t weakness. Contact guard during ambulation. Pt c/o minor chest pain. Rated chest pain 3/10 and showed slight fatigue otherwise asymptomatic during ambulation. Pt returned to bed with call bell in hand and all needs met.   Leory Plowman  Mobility Specialist Please contact via [employer] office at [phone]

## 2022-11-18 NOTE — Progress Notes (Addendum)
      [address]       [place] [address]             [phone]      8 Days Post-Op Procedure(s) (LRB): CORONARY ARTERY BYPASS GRAFTING (CABG) X5 USING LEFT INTERNAL MAMMARY ARTERY (LIMA) AND ENDOSCOPICALLY HARVESTED RIGHT AND LEFT GREATER SAPHENOUS VEIN (N/A) TRANSESOPHAGEAL ECHOCARDIOGRAM (N/A)  Subjective:  Patient states he is feeling rough.  He states he has been sitting up in the chair a while and is uncomfortable.   Objective: Vital signs in last 24 hours: Temp:  [98.3 F (36.8 C)-98.7 F (37.1 C)] 98.3 F (36.8 C) (07/05 0316) Pulse Rate:  [70-80] 77 (07/05 0316) Cardiac Rhythm: Normal sinus rhythm (07/04 1903) Resp:  [20-21] 20 (07/05 0316) BP: (124-150)/(61-72) 150/64 (07/05 0316) SpO2:  [93 %-94 %] 93 % (07/05 0316) Weight:  [84.1 kg] 84.1 kg (07/05 0316)  Intake/Output from previous day: 07/04 0701 - 07/05 0700 In: 240 [P.O.:240] Out: 1300 [Urine:1300]  General appearance: alert, cooperative, and no distress Heart: regular rate and rhythm Lungs: diminished breath sounds bibasilar Abdomen: soft, non-tender; bowel sounds normal; no masses,  no organomegaly Extremities: edema trace, improved Wound: clean and dry  Lab Results: Recent Labs    11/16/22 0055 11/18/22 0122  WBC 7.1 6.8  HGB 9.4* 8.9*  HCT 28.4* 27.0*  PLT 102* 120*   BMET:  Recent Labs    11/17/22 0047 11/18/22 0122  NA 137 135  K 3.8 3.8  CL 104 103  CO2 26 24  GLUCOSE 122* 111*  BUN 20 22  CREATININE 1.37* 1.41*  CALCIUM 8.3* 8.3*    PT/INR: No results for input(s): "LABPROT", "INR" in the last 72 hours. ABG    Component Value Date/Time   PHART 7.355 11/12/2022 0908   HCO3 23.7 11/12/2022 0908   TCO2 25 11/12/2022 0908   ACIDBASEDEF 2.0 11/12/2022 0908   O2SAT 95 11/12/2022 0908   CBG (last 3)  Recent Labs    11/17/22 1641 11/17/22 2110 11/18/22 0622  GLUCAP 115* 110* 129*    Assessment/Plan: S/P Procedure(s) (LRB): CORONARY ARTERY BYPASS  GRAFTING (CABG) X5 USING LEFT INTERNAL MAMMARY ARTERY (LIMA) AND ENDOSCOPICALLY HARVESTED RIGHT AND LEFT GREATER SAPHENOUS VEIN (N/A) TRANSESOPHAGEAL ECHOCARDIOGRAM (N/A)  CV- NSR, + HTN- continue Lopressor, Amiodarone, Hydralazine, Cozaar Pulm- no acute issues, off oxygen, CXR with persistent atelectasis, small effusions Renal- creatinine relatively stable at 1.41, weight is only 2 lbs above baseline, will stop lasix/potassium CKD- Stage 3b, creatinine relatively stable, weight is only 2 lbs above baseline, will stop Lasix, potassium DM- sugars controlled, home Metformin has been resumed Deconditioning- needs SNF, ready today if bed is available   LOS: 8 days    Lowella Dandy, PA-C 11/18/2022   Chart reviewed, patient examined, agree with above. He is medically stable for SNF. Insurance authorization is pending.

## 2022-11-18 NOTE — Progress Notes (Addendum)
Spoke to pt and pt's SO Verla re SNF choice and they have accepted [facility]. [employer] auth pending at this time, ref # [id number]. Confirmed with Soy in [facility] admissions [phone] they are able to accept pt once auth received, including weekend. PASRR number remains PENDING at this time. Will need PASRR prior to dc.   Weekend SW to check for [place]. Soy is the weekend contact for [facility] if pt appropriate for dc.   Dellie Burns, MSW, LCSW [phone] (coverage)

## 2022-11-18 NOTE — Progress Notes (Signed)
CARDIAC REHAB PHASE I   Post op OHS education including site care, restrictions, heart healthy diabetic diet, sternal precautions and CRP2 reviewed. All questions and concerns addressed. Plan for discharge to SNF. Pt is hopeful for return to home as soon as possible. Eager to try CRP2 outpatient program when able. Will send referral to [facility] for CRP2.   [id number]  Woodroe Chen, RN BSN 11/18/2022 9:51 AM

## 2022-11-19 LAB — GLUCOSE, CAPILLARY
Glucose-Capillary: 118 mg/dL — ABNORMAL HIGH (ref 70–99)
Glucose-Capillary: 151 mg/dL — ABNORMAL HIGH (ref 70–99)
Glucose-Capillary: 107 mg/dL — ABNORMAL HIGH (ref 70–99)
Glucose-Capillary: 108 mg/dL — ABNORMAL HIGH (ref 70–99)

## 2022-11-19 MED ORDER — METOPROLOL TARTRATE 25 MG PO TABS: 25.0000 mg | ORAL_TABLET | Freq: Two times a day (BID) | ORAL | 1 refills | Status: DC

## 2022-11-19 MED ORDER — ASPIRIN 325 MG PO TBEC: 325.0000 mg | DELAYED_RELEASE_TABLET | Freq: Every day | ORAL | Status: DC

## 2022-11-19 MED ORDER — AMIODARONE HCL 200 MG PO TABS: 200.0000 mg | ORAL_TABLET | Freq: Two times a day (BID) | ORAL | Status: DC

## 2022-11-19 MED ORDER — TRAMADOL HCL 50 MG PO TABS: 50.0000 mg | ORAL_TABLET | Freq: Four times a day (QID) | ORAL | 0 refills | Status: DC | PRN

## 2022-11-19 NOTE — Progress Notes (Signed)
CARDIAC REHAB PHASE I   PRE:  Rate/Rhythm: 76 SR  BP:  Sitting: 135/59      SaO2: 94 RA  MODE:  Ambulation: 190 ft   AD:   RW  POST:  Rate/Rhythm: 93 SR  BP:  Sitting: 135/76      SaO2: 95 RA  Pt amb with x2 assist using contact guard with gait bet, pt able to recall sternal precautions and follow verbal commands. Stood with x2 assist and ambulated at slow and steady pace before he was returned to chair in room.  Tried to spot check SpO2 during ambulation but unable to get reading.  Pt has little appetite, educated on the importance of eating and continued IS use.   Faustino Congress  ACSM-CEP 8:52 AM 11/19/2022    Service time is from 0830 to 0859.

## 2022-11-19 NOTE — Progress Notes (Addendum)
      [address]       [place] [address]             [phone]      9 Days Post-Op Procedure(s) (LRB): CORONARY ARTERY BYPASS GRAFTING (CABG) X5 USING LEFT INTERNAL MAMMARY ARTERY (LIMA) AND ENDOSCOPICALLY HARVESTED RIGHT AND LEFT GREATER SAPHENOUS VEIN (N/A) TRANSESOPHAGEAL ECHOCARDIOGRAM (N/A)  Subjective:  Patient without new complaints.  Wants to get set up to eat his breakfast.  + ambulation with assist  + BM  Objective: Vital signs in last 24 hours: Temp:  [97.9 F (36.6 C)-98.7 F (37.1 C)] 98.3 F (36.8 C) (07/06 0339) Pulse Rate:  [68-74] 71 (07/06 0606) Cardiac Rhythm: Normal sinus rhythm (07/05 1930) Resp:  [18-25] 18 (07/06 0606) BP: (121-147)/(59-76) 137/71 (07/06 0339) SpO2:  [91 %-95 %] 93 % (07/06 0606) Weight:  [83.8 kg] 83.8 kg (07/06 0606)  Intake/Output from previous day: 07/05 0701 - 07/06 0700 In: 720 [P.O.:720] Out: 1600 [Urine:1600]  General appearance: alert, cooperative, and no distress Heart: regular rate and rhythm Lungs: clear to auscultation bilaterally Abdomen: soft, non-tender; bowel sounds normal; no masses,  no organomegaly Extremities: edema mild pitting Wound: clean and dry  Lab Results: Recent Labs    11/18/22 0122  WBC 6.8  HGB 8.9*  HCT 27.0*  PLT 120*   BMET:  Recent Labs    11/17/22 0047 11/18/22 0122  NA 137 135  K 3.8 3.8  CL 104 103  CO2 26 24  GLUCOSE 122* 111*  BUN 20 22  CREATININE 1.37* 1.41*  CALCIUM 8.3* 8.3*    PT/INR: No results for input(s): "LABPROT", "INR" in the last 72 hours. ABG    Component Value Date/Time   PHART 7.355 11/12/2022 0908   HCO3 23.7 11/12/2022 0908   TCO2 25 11/12/2022 0908   ACIDBASEDEF 2.0 11/12/2022 0908   O2SAT 95 11/12/2022 0908   CBG (last 3)  Recent Labs    11/18/22 1658 11/18/22 2127 11/19/22 0611  GLUCAP 115* 125* 118*    Assessment/Plan: S/P Procedure(s) (LRB): CORONARY ARTERY BYPASS GRAFTING (CABG) X5 USING LEFT INTERNAL  MAMMARY ARTERY (LIMA) AND ENDOSCOPICALLY HARVESTED RIGHT AND LEFT GREATER SAPHENOUS VEIN (N/A) TRANSESOPHAGEAL ECHOCARDIOGRAM (N/A)  CV- NSR, HTN- continue Lopressor, Amiodarone, Cozaar, Hydralazine Pulm- no acute issues, off oxygen, continue IS Renal- weight is trending down, + pitting edema on exam, H/O CKD Stage 3 creatinine was slightly increased yesterday, will place TED hose, repeat BMET in AM DM- sugars are controlled Deconditioning- awaiting SNF placement due to insurance authorization   LOS: 9 days    Lowella Dandy, PA-C 11/19/2022   Chart reviewed, patient examined, agree with above. He is medically stable and making progress with ambulation. Awaiting authorization for SNF placement.

## 2022-11-20 LAB — BASIC METABOLIC PANEL
Anion gap: 7 (ref 5–15)
BUN: 15 mg/dL (ref 8–23)
CO2: 23 mmol/L (ref 22–32)
Calcium: 8.4 mg/dL — ABNORMAL LOW (ref 8.9–10.3)
Chloride: 103 mmol/L (ref 98–111)
Creatinine, Ser: 1.29 mg/dL — ABNORMAL HIGH (ref 0.61–1.24)
GFR, Estimated: 57 mL/min — ABNORMAL LOW (ref >60)
Glucose, Bld: 101 mg/dL — ABNORMAL HIGH (ref 70–99)
Potassium: 3.7 mmol/L (ref 3.5–5.1)
Sodium: 133 mmol/L — ABNORMAL LOW (ref 135–145)

## 2022-11-20 LAB — GLUCOSE, CAPILLARY
Glucose-Capillary: 111 mg/dL — ABNORMAL HIGH (ref 70–99)
Glucose-Capillary: 109 mg/dL — ABNORMAL HIGH (ref 70–99)
Glucose-Capillary: 115 mg/dL — ABNORMAL HIGH (ref 70–99)
Glucose-Capillary: 127 mg/dL — ABNORMAL HIGH (ref 70–99)

## 2022-11-20 MED ORDER — POLYVINYL ALCOHOL 1.4 % OP SOLN
1.0000 [drp] | OPHTHALMIC | Status: DC | PRN
  Administered 2022-11-20: 1 [drp] via OPHTHALMIC
  Filled 2022-11-20: qty 15

## 2022-11-20 NOTE — Progress Notes (Addendum)
      [address]       [place] [address]             [phone]      10 Days Post-Op Procedure(s) (LRB): CORONARY ARTERY BYPASS GRAFTING (CABG) X5 USING LEFT INTERNAL MAMMARY ARTERY (LIMA) AND ENDOSCOPICALLY HARVESTED RIGHT AND LEFT GREATER SAPHENOUS VEIN (N/A) TRANSESOPHAGEAL ECHOCARDIOGRAM (N/A)  Subjective:  Patient asking for eye drops.  States he feels like there is something in his eyes all night.  Objective: Vital signs in last 24 hours: Temp:  [97.5 F (36.4 C)-98.4 F (36.9 C)] 98.4 F (36.9 C) (07/07 0754) Pulse Rate:  [64-74] 70 (07/07 0754) Cardiac Rhythm: Normal sinus rhythm;Bundle branch block (07/06 2041) Resp:  [18-24] 20 (07/07 0754) BP: (126-155)/(61-79) 150/75 (07/07 0754) SpO2:  [92 %-96 %] 96 % (07/07 0754) Weight:  [76.3 kg] 76.3 kg (07/07 0525)  Intake/Output from previous day: 07/06 0701 - 07/07 0700 In: -  Out: 1600 [Urine:1600] Intake/Output this shift: Total I/O In: 180 [P.O.:180] Out: -   General appearance: alert, cooperative, and no distress Heart: regular rate and rhythm Lungs: clear to auscultation bilaterally Abdomen: soft, non-tender; bowel sounds normal; no masses,  no organomegaly Extremities: edema trace Wound: clean and dry  Lab Results: Recent Labs    11/18/22 0122  WBC 6.8  HGB 8.9*  HCT 27.0*  PLT 120*   BMET:  Recent Labs    11/18/22 0122 11/20/22 0110  NA 135 133*  K 3.8 3.7  CL 103 103  CO2 24 23  GLUCOSE 111* 101*  BUN 22 15  CREATININE 1.41* 1.29*  CALCIUM 8.3* 8.4*    PT/INR: No results for input(s): "LABPROT", "INR" in the last 72 hours. ABG    Component Value Date/Time   PHART 7.355 11/12/2022 0908   HCO3 23.7 11/12/2022 0908   TCO2 25 11/12/2022 0908   ACIDBASEDEF 2.0 11/12/2022 0908   O2SAT 95 11/12/2022 0908   CBG (last 3)  Recent Labs    11/19/22 1629 11/19/22 2140 11/20/22 0603  GLUCAP 107* 108* 111*    Assessment/Plan: S/P Procedure(s) (LRB): CORONARY  ARTERY BYPASS GRAFTING (CABG) X5 USING LEFT INTERNAL MAMMARY ARTERY (LIMA) AND ENDOSCOPICALLY HARVESTED RIGHT AND LEFT GREATER SAPHENOUS VEIN (N/A) TRANSESOPHAGEAL ECHOCARDIOGRAM (N/A)  CV- NSR, HTN- continue Lopressor, Amiodarone, Cozaar, Hydralazine Pulm- no issues, continue IS Renal- creatinine continues to improve down to 1.29, weight continues to trend down Dry Eyes- known cataracts, states feels like something is in his eye.. will order artifical tears prn DM- sugars remain well controlled, on Metformin Deconditioning- SNF  Patient remains medically stable for discharge, awaiting insurance authorization for SNF placement.. [facility] bed @ [facility]   LOS: 10 days    Lowella Dandy, PA-C 11/20/2022   Chart reviewed, patient examined, agree with above. He has been very stable overall. Ready for discharge to SNF when approved.

## 2022-11-20 NOTE — Progress Notes (Signed)
Mobility Specialist Progress Note   11/20/22 1555  Mobility  Activity Transferred to/from [facility]  Level of Assistance Moderate assist, patient does 50-74%  Assistive Device Other (Comment) (HHA)  Distance Ambulated (ft) 2 ft  RUE Weight Bearing NWB  LUE Weight Bearing NWB  Activity Response Tolerated well  Mobility Referral Yes  $Mobility charge 1 Mobility  Mobility Specialist Start Time (ACUTE ONLY) 1547  Mobility Specialist Stop Time (ACUTE ONLY) 1555  Mobility Specialist Time Calculation (min) (ACUTE ONLY) 8 min   Pt requesting assistance to get from chair to [facility] d/t BM urgency. Pt able to recall sternal precautions prior to transfer, required modA to stand and pivot to [facility]. Pt presenting w/ general weakness and prematurely trying to sit on [facility] and requiring modA on the descent. Left on [facility] w/ call bell in reach and NT aware of position.  Frederico Hamman Mobility Specialist Please contact via SecureChat or  Rehab office at [phone]

## 2022-11-21 DIAGNOSIS — L8990 Pressure ulcer of unspecified site, unspecified stage: Secondary | ICD-10-CM | POA: Insufficient documentation

## 2022-11-21 DIAGNOSIS — Z951 Presence of aortocoronary bypass graft: Secondary | ICD-10-CM

## 2022-11-21 LAB — GLUCOSE, CAPILLARY
Glucose-Capillary: 126 mg/dL — ABNORMAL HIGH (ref 70–99)
Glucose-Capillary: 134 mg/dL — ABNORMAL HIGH (ref 70–99)

## 2022-11-21 NOTE — Progress Notes (Signed)
Attempted to call report to Collin Bridges x3 with no answer

## 2022-11-21 NOTE — Progress Notes (Signed)
Collin Bridges to be D/C'd Skilled nursing facility per MD order.  Discussed with the patient and all questions fully answered.  VSS, Skin clean, dry and intact without evidence of skin break down, no evidence of skin tears noted. IV catheter discontinued intact. Site without signs and symptoms of complications. Dressing and pressure applied.  An After Visit Summary was printed and given to the patient. Patient received prescription.  D/c education completed with patient/family including follow up instructions, medication list, d/c activities limitations if indicated, with other d/c instructions as indicated by MD - patient able to verbalize understanding, all questions fully answered.   Patient instructed to return to ED, call 911, or call MD for any changes in condition.   Patient escorted via WC, and D/C home via private auto.  [name] 11/21/2022 1:32 PM

## 2022-11-21 NOTE — Progress Notes (Signed)
Physical Therapy Treatment Patient Details Name: Collin Bridges MRN: [id number] DOB: 06-17-1945 Today's Date: 11/21/2022   History of Present Illness 77 yo male admitted 6/27 for CABG x 5. PMHX: CAD, HTN, anxiety, BPH, HLD, CKD, CVA, T2DM, RLS    PT Comments  Pt received in supine and agreeable to session. Pt demonstrating increased difficulty with bed mobility with limited use of BUE to maintain precautions, however is able to complete without assist. Pt continuing to require mod A to stand due to BLE weakness and limited UE support. Pt using momentum and having multiple unsuccessful attempts. Pt able to tolerate gait trial with improved step length with cues, however is limited by fatigue and decreased activity tolerance. Pt requiring a seated rest break before attempting another standing trial from the recliner. Pt continues to benefit from PT services to progress toward functional mobility goals.      Assistance Recommended at Discharge Frequent or constant Supervision/Assistance  If plan is discharge home, recommend the following:  Can travel by private vehicle    A lot of help with walking and/or transfers;A lot of help with bathing/dressing/bathroom;Assistance with cooking/housework;Assist for transportation;Help with stairs or ramp for entrance   No  Equipment Recommendations  Rolling walker (2 wheels);BSC/3in1    Recommendations for Other Services       Precautions / Restrictions Precautions Precautions: Sternal;Fall Precaution Comments: watch O2 Restrictions Weight Bearing Restrictions: Yes RUE Weight Bearing: Non weight bearing LUE Weight Bearing: Non weight bearing Other Position/Activity Restrictions: sternal precautions     Mobility  Bed Mobility Overal bed mobility: Needs Assistance Bed Mobility: Supine to Sit     Supine to sit: Min guard     General bed mobility comments: Increased time/effort to maintain precautions. Close min guard and cues for  technique, but no physical assist. Min A with bedpad to scoot forward at EOB.    Transfers Overall transfer level: Needs assistance Equipment used: Rolling walker (2 wheels) Transfers: Sit to/from Stand Sit to Stand: Mod assist           General transfer comment: Mod A for power up from elevated EOB and recliner after multiple unsuccessful attempts. Cues for anterior lean and hands on knees.    Ambulation/Gait Ambulation/Gait assistance: Min guard Gait Distance (Feet): 120 Feet Assistive device: Rolling walker (2 wheels) Gait Pattern/deviations: Decreased stride length, Trunk flexed, Step-through pattern       General Gait Details: Pt demonstrating short step-through pattern. pt able to improve step length with cues.       Balance Overall balance assessment: Needs assistance Sitting-balance support: No upper extremity supported, Feet supported Sitting balance-Leahy Scale: Fair Sitting balance - Comments: sitting EOB   Standing balance support: Bilateral upper extremity supported, During functional activity, Reliant on assistive device for balance Standing balance-Leahy Scale: Poor Standing balance comment: reliant on RW support                            Cognition Arousal/Alertness: Awake/alert Behavior During Therapy: Flat affect Overall Cognitive Status: Within Functional Limits for tasks assessed                                          Exercises      General Comments        Pertinent Vitals/Pain Pain Assessment Pain Assessment: 0-10 Pain Score: 3  Pain  Location: chest/incision Pain Descriptors / Indicators: Sore Pain Intervention(s): Monitored during session, Repositioned     PT Goals (current goals can now be found in the care plan section) Acute Rehab PT Goals Patient Stated Goal: home PT Goal Formulation: With patient Time For Goal Achievement: 11/26/22 Potential to Achieve Goals: Good Progress towards PT goals:  Progressing toward goals    Frequency    Min 1X/week      PT Plan Current plan remains appropriate       AM-PAC PT "6 Clicks" Mobility   Outcome Measure  Help needed turning from your back to your side while in a flat bed without using bedrails?: A Little Help needed moving from lying on your back to sitting on the side of a flat bed without using bedrails?: A Little Help needed moving to and from a bed to a chair (including a wheelchair)?: A Little Help needed standing up from a chair using your arms (e.g., wheelchair or bedside chair)?: A Lot Help needed to walk in hospital room?: A Little Help needed climbing 3-5 steps with a railing? : A Lot 6 Click Score: 16    End of Session Equipment Utilized During Treatment: Gait belt Activity Tolerance: Patient tolerated treatment well Patient left: in chair;with call bell/phone within reach Nurse Communication: Mobility status PT Visit Diagnosis: Other abnormalities of gait and mobility (R26.89);Muscle weakness (generalized) (M62.81);Difficulty in walking, not elsewhere classified (R26.2)     Time: 1610-9604 PT Time Calculation (min) (ACUTE ONLY): 24 min  Charges:    $Gait Training: 8-22 mins $Therapeutic Activity: 8-22 mins PT General Charges $$ ACUTE PT VISIT: 1 Visit                     Johny Shock, PTA Acute Rehabilitation Services Secure Chat Preferred  [phone]    Johny Shock 11/21/2022, 10:50 AM

## 2022-11-21 NOTE — Care Management Important Message (Signed)
Important Message  Patient Details  Name: Collin Bridges MRN: [id number] Date of Birth: Jan 20, 1946   Medicare Important Message Given:  Yes     [name] 11/21/2022, 11:42 AM

## 2022-11-21 NOTE — TOC Progression Note (Signed)
Transition of Care [facility]) - Progression Note    Patient Details  Name: Collin Bridges MRN: [id number] Date of Birth: 1945-06-15  Transition of Care [facility]) CM/SW Contact  Eduard Roux, [place] Phone Number: 11/21/2022, 10:29 AM  Clinical Narrative:     [name] # [id number] A Received insurance authorization # [id number] reference # [id number] 7/6-7/8  Expected Discharge Plan: Skilled Nursing Facility Barriers to Discharge: Continued Medical Work up  Expected Discharge Plan and Services In-house Referral: Clinical Social Work     Living arrangements for the past 2 months: Single Family Home                                       Social Determinants of Health (SDOH) Interventions SDOH Screenings   Food Insecurity: No Food Insecurity (09/29/2022)  Housing: Low Risk  (09/29/2022)  Transportation Needs: No Transportation Needs (09/29/2022)  Utilities: Not At Risk (07/09/2022)  Depression (PHQ2-9): High Risk (07/18/2022)  Financial Resource Strain: Low Risk  (09/29/2022)  Physical Activity: Insufficiently Active (09/29/2022)  Social Connections: Socially Isolated (09/29/2022)  Stress: No Stress Concern Present (09/29/2022)  Tobacco Use: Medium Risk (11/14/2022)    Readmission Risk Interventions     No data to display

## 2022-11-21 NOTE — Progress Notes (Signed)
Pt ambulated earlier with PT. Reviewed education, no further questions. Pt eager to d/c. Using IS and flutter. [id number] [name] BS, ACSM-CEP 11/21/2022 11:49 AM

## 2022-11-21 NOTE — Progress Notes (Signed)
      [address]       [place] [address]             [phone]      11 Days Post-Op Procedure(s) (LRB): CORONARY ARTERY BYPASS GRAFTING (CABG) X5 USING LEFT INTERNAL MAMMARY ARTERY (LIMA) AND ENDOSCOPICALLY HARVESTED RIGHT AND LEFT GREATER SAPHENOUS VEIN (N/A) TRANSESOPHAGEAL ECHOCARDIOGRAM (N/A)  Subjective:  Patient doing well.  States he is cold this morning.  Working with PT/OT   + BM  Objective: Vital signs in last 24 hours: Temp:  [97.4 F (36.3 C)-98.4 F (36.9 C)] 98.2 F (36.8 C) (07/08 0558) Pulse Rate:  [63-72] 69 (07/08 0558) Cardiac Rhythm: Normal sinus rhythm;Bundle branch block (07/07 2214) Resp:  [17-20] 18 (07/08 0558) BP: (121-150)/(62-76) 147/76 (07/08 0558) SpO2:  [93 %-98 %] 96 % (07/08 0558) Weight:  [79 kg] 79 kg (07/08 0558)  Intake/Output from previous day: 07/07 0701 - 07/08 0700 In: 300 [P.O.:300] Out: 3700 [Urine:3700]  General appearance: alert, cooperative, and no distress Heart: regular rate and rhythm Lungs: clear to auscultation bilaterally Abdomen: soft, non-tender; bowel sounds normal; no masses,  no organomegaly Extremities: edema mild pitting Wound: clean and dry  Lab Results: No results for input(s): "WBC", "HGB", "HCT", "PLT" in the last 72 hours. BMET:  Recent Labs    11/20/22 0110  NA 133*  K 3.7  CL 103  CO2 23  GLUCOSE 101*  BUN 15  CREATININE 1.29*  CALCIUM 8.4*    PT/INR: No results for input(s): "LABPROT", "INR" in the last 72 hours. ABG    Component Value Date/Time   PHART 7.355 11/12/2022 0908   HCO3 23.7 11/12/2022 0908   TCO2 25 11/12/2022 0908   ACIDBASEDEF 2.0 11/12/2022 0908   O2SAT 95 11/12/2022 0908   CBG (last 3)  Recent Labs    11/20/22 1638 11/20/22 2111 11/21/22 0559  GLUCAP 115* 127* 126*    Assessment/Plan: S/P Procedure(s) (LRB): CORONARY ARTERY BYPASS GRAFTING (CABG) X5 USING LEFT INTERNAL MAMMARY ARTERY (LIMA) AND ENDOSCOPICALLY HARVESTED RIGHT AND LEFT  GREATER SAPHENOUS VEIN (N/A) TRANSESOPHAGEAL ECHOCARDIOGRAM (N/A)  CV- NSR, HTN- continue Lopressor, Amiodarone, Cozaar, Hydralazine Pulm- no acute issues, continue IS Dry Eyes-improved, continue artifical tears DM- sugars stable, continue Metformin Deconditioning- SNF placement.. has bed at [facility], awaiting insurance authorization.. ready for d/c   LOS: 11 days   Lowella Dandy, PA-C 11/21/2022

## 2022-11-21 NOTE — TOC Transition Note (Signed)
Transition of Care [facility]) - CM/SW Discharge Note   Patient Details  Name: Collin Bridges MRN: [id number] Date of Birth: 07/29/1945  Transition of Care [facility]) CM/SW Contact:  Eduard Roux, LCSW Phone Number: 11/21/2022, 12:19 PM   Clinical Narrative:     Patient will Discharge to: [facility]idegroom Discharge Date: 11/21/2022 Family Notified: Lynnette Caffey Transport By: Sharin Mons  Per MD patient is ready for discharge. RN, patient, and facility notified of discharge. Discharge Summary sent to facility. RN given number for [phone], Room 203. Ambulance transport requested for patient.   Clinical Social Worker signing off.  Antony Blackbird, MSW, LCSW Clinical Social Worker     Final next level of care: Skilled Nursing Facility Barriers to Discharge: Barriers Resolved   Patient Goals and CMS Choice CMS Medicare.gov Compare Post Acute Care list provided to:: Patient Choice offered to / list presented to : Patient  Discharge Placement                  Patient to be transferred to facility by: PTAR Name of family member notified: Verla Patient and family notified of of transfer: 11/21/22  Discharge Plan and Services Additional resources added to the After Visit Summary for   In-house Referral: Clinical Social Work                                   Social Determinants of Health (SDOH) Interventions SDOH Screenings   Food Insecurity: No Food Insecurity (09/29/2022)  Housing: Low Risk  (09/29/2022)  Transportation Needs: No Transportation Needs (09/29/2022)  Utilities: Not At Risk (07/09/2022)  Depression (PHQ2-9): High Risk (07/18/2022)  Financial Resource Strain: Low Risk  (09/29/2022)  Physical Activity: Insufficiently Active (09/29/2022)  Social Connections: Socially Isolated (09/29/2022)  Stress: No Stress Concern Present (09/29/2022)  Tobacco Use: Medium Risk (11/14/2022)     Readmission Risk Interventions     No data to display

## 2022-11-22 ENCOUNTER — Emergency Department (HOSPITAL_COMMUNITY): Payer: 59

## 2022-11-22 ENCOUNTER — Encounter (HOSPITAL_COMMUNITY): Payer: Self-pay

## 2022-11-22 ENCOUNTER — Telehealth: Payer: Self-pay

## 2022-11-22 ENCOUNTER — Ambulatory Visit: Payer: Self-pay

## 2022-11-22 ENCOUNTER — Emergency Department (HOSPITAL_COMMUNITY)
Admission: EM | Admit: 2022-11-22 | Discharge: 2022-11-22 | Disposition: A | Discharge status: Home / Self Care | Payer: 59 | Source: Home / Self Care | Attending: Emergency Medicine | Admitting: Emergency Medicine

## 2022-11-22 DIAGNOSIS — D649 Anemia, unspecified: Secondary | ICD-10-CM | POA: Insufficient documentation

## 2022-11-22 DIAGNOSIS — R042 Hemoptysis: Secondary | ICD-10-CM | POA: Insufficient documentation

## 2022-11-22 DIAGNOSIS — Z951 Presence of aortocoronary bypass graft: Secondary | ICD-10-CM | POA: Insufficient documentation

## 2022-11-22 DIAGNOSIS — Z79899 Other long term (current) drug therapy: Secondary | ICD-10-CM | POA: Insufficient documentation

## 2022-11-22 DIAGNOSIS — Z1152 Encounter for screening for COVID-19: Secondary | ICD-10-CM | POA: Insufficient documentation

## 2022-11-22 DIAGNOSIS — Z7982 Long term (current) use of aspirin: Secondary | ICD-10-CM | POA: Insufficient documentation

## 2022-11-22 DIAGNOSIS — R7989 Other specified abnormal findings of blood chemistry: Secondary | ICD-10-CM | POA: Insufficient documentation

## 2022-11-22 DIAGNOSIS — J029 Acute pharyngitis, unspecified: Secondary | ICD-10-CM | POA: Insufficient documentation

## 2022-11-22 DIAGNOSIS — I9789 Other postprocedural complications and disorders of the circulatory system, not elsewhere classified: Secondary | ICD-10-CM | POA: Diagnosis not present

## 2022-11-22 LAB — COMPREHENSIVE METABOLIC PANEL
ALT: 27 U/L (ref 0–44)
AST: 45 U/L — ABNORMAL HIGH (ref 15–41)
Albumin: 3.2 g/dL — ABNORMAL LOW (ref 3.5–5.0)
Alkaline Phosphatase: 116 U/L (ref 38–126)
Anion gap: 8 (ref 5–15)
BUN: 14 mg/dL (ref 8–23)
CO2: 23 mmol/L (ref 22–32)
Calcium: 8.6 mg/dL — ABNORMAL LOW (ref 8.9–10.3)
Chloride: 103 mmol/L (ref 98–111)
Creatinine, Ser: 1.26 mg/dL — ABNORMAL HIGH (ref 0.61–1.24)
GFR, Estimated: 59 mL/min — ABNORMAL LOW (ref >60)
Glucose, Bld: 119 mg/dL — ABNORMAL HIGH (ref 70–99)
Potassium: 3.9 mmol/L (ref 3.5–5.1)
Sodium: 134 mmol/L — ABNORMAL LOW (ref 135–145)
Total Bilirubin: 1.4 mg/dL — ABNORMAL HIGH (ref 0.3–1.2)
Total Protein: 5.9 g/dL — ABNORMAL LOW (ref 6.5–8.1)

## 2022-11-22 LAB — CBC WITH DIFFERENTIAL/PLATELET
Abs Immature Granulocytes: 0.05 10*3/uL (ref 0.00–0.07)
Basophils Absolute: 0.1 10*3/uL (ref 0.0–0.1)
Basophils Relative: 1 %
Eosinophils Absolute: 0.4 10*3/uL (ref 0.0–0.5)
Eosinophils Relative: 4 %
HCT: 33.1 % — ABNORMAL LOW (ref 39.0–52.0)
Hemoglobin: 11 g/dL — ABNORMAL LOW (ref 13.0–17.0)
Immature Granulocytes: 1 %
Lymphocytes Relative: 9 %
Lymphs Abs: 0.8 10*3/uL (ref 0.7–4.0)
MCH: 30.5 pg (ref 26.0–34.0)
MCHC: 33.2 g/dL (ref 30.0–36.0)
MCV: 91.7 fL (ref 80.0–100.0)
Monocytes Absolute: 0.7 10*3/uL (ref 0.1–1.0)
Monocytes Relative: 8 %
Neutro Abs: 7.1 10*3/uL (ref 1.7–7.7)
Neutrophils Relative %: 77 %
Platelets: 295 10*3/uL (ref 150–400)
RBC: 3.61 MIL/uL — ABNORMAL LOW (ref 4.22–5.81)
RDW: 15.7 % — ABNORMAL HIGH (ref 11.5–15.5)
WBC: 9.1 10*3/uL (ref 4.0–10.5)
nRBC: 0 % (ref 0.0–0.2)

## 2022-11-22 LAB — PROTIME-INR
INR: 1.3 — ABNORMAL HIGH (ref 0.8–1.2)
Prothrombin Time: 16.5 seconds — ABNORMAL HIGH (ref 11.4–15.2)

## 2022-11-22 LAB — SARS CORONAVIRUS 2 BY RT PCR: SARS Coronavirus 2 by RT PCR: NEGATIVE

## 2022-11-22 MED ORDER — IOHEXOL 350 MG/ML SOLN
75.0000 mL | Freq: Once | INTRAVENOUS | Status: AC | PRN
  Administered 2022-11-22: 75 mL via INTRAVENOUS

## 2022-11-22 MED ORDER — HYDROCODONE-ACETAMINOPHEN 5-325 MG PO TABS
1.0000 | ORAL_TABLET | Freq: Once | ORAL | Status: AC
  Administered 2022-11-22: 1 via ORAL
  Filled 2022-11-22: qty 1

## 2022-11-22 NOTE — ED Notes (Addendum)
PT requested something for pain.Pain level was a 3 per PT in left flank area.Vicodin for moderate pain(prn) was approved by [name] MD

## 2022-11-22 NOTE — Transitions of Care (Post Inpatient/ED Visit) (Signed)
   11/22/2022  Name: Collin Bridges MRN: [id number] DOB: November 20, 1945  Pt in rehab facility and will call to schedule fu appt when returns home.Marland Kitchen opened in error    Woodfin Ganja LPN [facility] Nurse Health Advisor Direct Dial [phone]

## 2022-11-22 NOTE — ED Notes (Signed)
Ptar called for transport 

## 2022-11-22 NOTE — ED Provider Triage Note (Signed)
Emergency Medicine Provider Triage Evaluation Note  Collin Bridges , a 77 y.o. male  was evaluated in triage.  Pt complains of hemoptysis.  Review of Systems  Positive: Cough, hemoptysis Negative: Chest pain, fever, chills  Physical Exam  BP (!) 151/81 (BP Location: Left Arm)   Pulse 68   Temp (!) 97.3 F (36.3 C) (Oral)   Resp 18   Ht 5\' 11"  (1.803 m)   Wt 78.9 kg   SpO2 95%   BMI 24.27 kg/m  Gen:   Awake, no distress   Resp:  Normal effort , no rales or rhonchi MSK:   Moves extremities without difficulty   Medical Decision Making  Medically screening exam initiated at 11:59 AM.  Appropriate orders placed.  Collin Bridges was informed that the remainder of the evaluation will be completed by another provider, this initial triage assessment does not replace that evaluation, and the importance of remaining in the ED until their evaluation is complete.  Screening labs and CT imaging ordered.   [name], MD 11/22/22 2141

## 2022-11-22 NOTE — ED Provider Notes (Addendum)
[facility]GENC[facility] DEPARTMENT AT [facility] Provider Note   CSN: [id number] Arrival date & time: 11/22/22  1133     History  Chief Complaint  Patient presents with   Sore Throat   Coughing Up Blood    Zhyon Antenucci Puthoff is a 77 y.o. male.   Sore Throat      77 year old male with medical history significant for recent CABG 1.5 weeks ago who presents to the emergency department from his rehab facility with a complaint of sore throat, nosebleed and coughing up blood since last night.  He endorses some discomfort in his throat 2 out of 10 in severity.  He states that he coughed up a blood clot earlier this morning that was moderate in size.  He has no further episodes of hemoptysis since being here in the emergency department. Home Medications Prior to Admission medications   Medication Sig Start Date End Date Taking? Authorizing Provider  amiodarone (PACERONE) 200 MG tablet Take 1 tablet (200 mg total) by mouth 2 (two) times daily. X 7 days, then decrease to 200 mg daily 11/19/22  Yes Barrett, Erin R, PA-C  aspirin EC 325 MG tablet Take 1 tablet (325 mg total) by mouth daily. 11/19/22  Yes Barrett, Erin R, PA-C  atorvastatin (LIPITOR) 80 MG tablet Take 1 tablet (80 mg total) by mouth daily. 07/14/22 11/24/22 Yes Narda Bonds, MD  cholecalciferol (VITAMIN D3) 25 MCG (1000 UNIT) tablet Take 1 tablet (1,000 Units total) by mouth daily. Take 1,000 Units by mouth daily. 11/22/21  Yes Corwin Levins, MD  Evolocumab (REPATHA SURECLICK) 140 MG/ML SOAJ Inject 140 mg into the skin every 14 days. 10/06/22  Yes O'Neal, Ronnald Ramp, MD  fenofibrate 160 MG tablet Take 1 tablet (160 mg total) by mouth daily. 11/09/22  Yes Corwin Levins, MD  fluticasone [facility]) 50 MCG/ACT nasal spray Place 2 sprays into both nostrils daily. 11/09/22  Yes Corwin Levins, MD  gabapentin (NEURONTIN) 300 MG capsule Take 1 capsule (300 mg total) by mouth at bedtime. 08/08/22  Yes Plotnikov, Georgina Quint, MD   hydrALAZINE (APRESOLINE) 25 MG tablet Take 1 tablet (25 mg total) by mouth every 8 (eight) hours. 10/11/22 01/09/23 Yes Corwin Levins, MD  losartan (COZAAR) 100 MG tablet Take 1 tablet (100 mg total) by mouth daily. 11/09/22  Yes Corwin Levins, MD  magnesium oxide (MAG-OX) 400 (240 Mg) MG tablet Take 400 mg by mouth daily.   Yes [provider]  metFORMIN (GLUCOPHAGE-XR) 500 MG 24 hr tablet Take 1 tablet (500 mg) by mouth daily with breakfast. 11/09/22  Yes Corwin Levins, MD  metoprolol tartrate (LOPRESSOR) 25 MG tablet Take 1 tablet (25 mg total) by mouth 2 (two) times daily. 11/19/22 03/19/23 Yes Barrett, Erin R, PA-C  nitroGLYCERIN (NITROSTAT) 0.4 MG SL tablet Place 1 tablet under the tongue every 5 minutes as needed for chest pain. 10/06/22 01/04/23 Yes O'Neal, Ronnald Ramp, MD  PARoxetine (PAXIL) 40 MG tablet Take 1 tablet (40 mg total) by mouth every morning. 11/09/22  Yes Corwin Levins, MD  vitamin E 180 MG (400 UNITS) capsule Take 400 Units by mouth daily.   Yes [provider]  zolpidem (AMBIEN) 10 MG tablet TAKE 1 TABLET(10 MG) BY MOUTH AT BEDTIME AS NEEDED FOR SLEEP Patient taking differently: Take 5-10 mg by mouth at bedtime. 09/22/22  Yes Corwin Levins, MD  Melatonin 10 MG TABS Take 10 mg by mouth at bedtime.  [provider]      Allergies    Patient has no known allergies.    Review of Systems   Review of Systems  All other systems reviewed and are negative.   Physical Exam Updated Vital Signs BP (!) 169/79   Pulse 76   Temp 97.9 F (36.6 C) (Oral)   Resp (!) 25   Ht 5\' 11"  (1.803 m)   Wt 78.9 kg   SpO2 97%   BMI 24.27 kg/m  Physical Exam Vitals and nursing note reviewed.  Constitutional:      General: He is not in acute distress.    Appearance: He is well-developed.  HENT:     Head: Normocephalic and atraumatic.  Eyes:     Conjunctiva/sclera: Conjunctivae normal.  Cardiovascular:     Rate and Rhythm: Normal rate and regular rhythm.   Pulmonary:     Effort: Pulmonary effort is normal. No respiratory distress.     Breath sounds: Normal breath sounds.  Chest:     Comments: Sternotomy site appears to be healing well, no surrounding erythema or tenderness Abdominal:     Palpations: Abdomen is soft.     Tenderness: There is no abdominal tenderness.  Musculoskeletal:        General: No swelling.     Cervical back: Neck supple.  Skin:    General: Skin is warm and dry.     Capillary Refill: Capillary refill takes less than 2 seconds.  Neurological:     Mental Status: He is alert.  Psychiatric:        Mood and Affect: Mood normal.     ED Results / Procedures / Treatments   Labs (all labs ordered are listed, but only abnormal results are displayed) Labs Reviewed  CBC WITH DIFFERENTIAL/PLATELET - Abnormal; Notable for the following components:      Result Value   RBC 3.61 (*)    Hemoglobin 11.0 (*)    HCT 33.1 (*)    RDW 15.7 (*)    All other components within normal limits  COMPREHENSIVE METABOLIC PANEL - Abnormal; Notable for the following components:   Sodium 134 (*)    Glucose, Bld 119 (*)    Creatinine, Ser 1.26 (*)    Calcium 8.6 (*)    Total Protein 5.9 (*)    Albumin 3.2 (*)    AST 45 (*)    Total Bilirubin 1.4 (*)    GFR, Estimated 59 (*)    All other components within normal limits  PROTIME-INR - Abnormal; Notable for the following components:   Prothrombin Time 16.5 (*)    INR 1.3 (*)    All other components within normal limits  SARS CORONAVIRUS 2 BY RT PCR    EKG EKG Interpretation Date/Time:  Tuesday November 22 2022 12:41:50 EDT Ventricular Rate:  66 PR Interval:  152 QRS Duration:  138 QT Interval:  502 QTC Calculation: 527 R Axis:   -23  Text Interpretation: Sinus rhythm Right bundle branch block Confirmed by [name] ([id number]) on 11/22/2022 1:21:57 PM  Radiology ECHOCARDIOGRAM COMPLETE  Result Date: 11/24/2022    ECHOCARDIOGRAM REPORT   Patient Name:   JORIAN WILLHOITE  Date of Exam: 11/24/2022 Medical Rec #:  [id number]             Height:       72.0 in Accession #:    [id number]            Weight:  170.0 lb Date of Birth:  Dec 13, 1945            BSA:          1.988 m Patient Age:    69 years              BP:           137/73 mmHg Patient Gender: M                     HR:           71 bpm. Exam Location:  Inpatient Procedure: 2D Echo, Cardiac Doppler and Color Doppler Indications:    Pericardial effusion I31.3  History:        Patient has prior history of Echocardiogram examinations, most                 recent 07/09/2022. CHF, Previous Myocardial Infarction and CAD,                 Prior CABG, Stroke, Signs/Symptoms:Chest Pain; Risk                 Factors:Hypertension, Diabetes and Dyslipidemia. CKD, stage 3.  Sonographer:    Lucendia Herrlich Referring Phys: [id number] ROBERT DORRELL IMPRESSIONS  1. Left ventricular ejection fraction, by estimation, is 60 to 65%. The left ventricle has normal function. The left ventricle has no regional wall motion abnormalities. Diastolic function indeterminant due to significant MAC.  2. Right ventricular systolic function is normal. The right ventricular size is normal. There is normal pulmonary artery systolic pressure. The estimated right ventricular systolic pressure is 15.1 mmHg.  3. A small pericardial effusion is present. There is no evidence of cardiac tamponade. Moderate pleural effusion in the left lateral region.  4. The mitral valve is degenerative. Trivial mitral valve regurgitation. Moderate mitral annular calcification.  5. The aortic valve is tricuspid. There is moderate calcification of the aortic valve. There is moderate thickening of the aortic valve. Aortic valve regurgitation is not visualized. Mild aortic valve stenosis. Aortic valve mean gradient measures 10.4 mmHg. Aortic valve Vmax measures 2.40 m/s.  6. Aortic dilatation noted. There is borderline dilatation of the ascending aorta, measuring 38 mm.  7. The inferior  vena cava is normal in size with greater than 50% respiratory variability, suggesting right atrial pressure of 3 mmHg. Comparison(s): No significant change from prior study. FINDINGS  Left Ventricle: Left ventricular ejection fraction, by estimation, is 60 to 65%. The left ventricle has normal function. The left ventricle has no regional wall motion abnormalities. The left ventricular internal cavity size was normal in size. There is  no left ventricular hypertrophy. Diastolic function indeterminant due to significant MAC. Right Ventricle: The right ventricular size is normal. No increase in right ventricular wall thickness. Right ventricular systolic function is normal. There is normal pulmonary artery systolic pressure. The tricuspid regurgitant velocity is 1.74 m/s, and  with an assumed right atrial pressure of 3 mmHg, the estimated right ventricular systolic pressure is 15.1 mmHg. Left Atrium: Left atrial size was normal in size. Right Atrium: Right atrial size was normal in size. Pericardium: A small pericardial effusion is present. There is no evidence of cardiac tamponade. Mitral Valve: The mitral valve is degenerative in appearance. There is moderate thickening of the mitral valve leaflet(s). There is moderate calcification of the mitral valve leaflet(s). Moderate mitral annular calcification. Trivial mitral valve regurgitation. Tricuspid Valve: The tricuspid valve is normal in structure. Tricuspid valve regurgitation is  trivial. Aortic Valve: The aortic valve is tricuspid. There is moderate calcification of the aortic valve. There is moderate thickening of the aortic valve. Aortic valve regurgitation is not visualized. Mild aortic stenosis is present. Aortic valve mean gradient measures 10.4 mmHg. Aortic valve peak gradient measures 23.0 mmHg. Aortic valve area, by VTI measures 1.08 cm. Pulmonic Valve: The pulmonic valve was grossly normal. Pulmonic valve regurgitation is trivial. Aorta: Aortic dilatation  noted. There is borderline dilatation of the ascending aorta, measuring 38 mm. Venous: The inferior vena cava is normal in size with greater than 50% respiratory variability, suggesting right atrial pressure of 3 mmHg. IAS/Shunts: The atrial septum is grossly normal. Additional Comments: There is a moderate pleural effusion in the left lateral region.  LEFT VENTRICLE PLAX 2D LVIDd:         3.30 cm   Diastology LVIDs:         2.20 cm   LV e' medial:    8.58 cm/s LV PW:         1.10 cm   LV E/e' medial:  7.9 LV IVS:        1.10 cm   LV e' lateral:   6.96 cm/s LVOT diam:     1.80 cm   LV E/e' lateral: 9.8 LV SV:         47 LV SV Index:   24 LVOT Area:     2.54 cm  RIGHT VENTRICLE            IVC RV S prime:     8.27 cm/s  IVC diam: 1.10 cm TAPSE (M-mode): 1.3 cm LEFT ATRIUM             Index        RIGHT ATRIUM           Index LA diam:        3.70 cm 1.86 cm/m   RA Area:     14.60 cm LA Vol (A2C):   61.2 ml 30.78 ml/m  RA Volume:   34.00 ml  17.10 ml/m LA Vol (A4C):   50.8 ml 25.55 ml/m LA Biplane Vol: 60.7 ml 30.53 ml/m  AORTIC VALVE AV Area (Vmax):    0.97 cm AV Area (Vmean):   1.02 cm AV Area (VTI):     1.08 cm AV Vmax:           240.00 cm/s AV Vmean:          147.500 cm/s AV VTI:            0.436 m AV Peak Grad:      23.0 mmHg AV Mean Grad:      10.4 mmHg LVOT Vmax:         91.40 cm/s LVOT Vmean:        58.867 cm/s LVOT VTI:          0.186 m LVOT/AV VTI ratio: 0.43  AORTA Ao Root diam: 3.40 cm Ao Asc diam:  3.80 cm MITRAL VALVE                TRICUSPID VALVE MV Area (PHT): 2.37 cm     TR Peak grad:   12.1 mmHg MV Decel Time: 320 msec     TR Vmax:        174.00 cm/s MV E velocity: 68.10 cm/s MV A velocity: 108.00 cm/s  SHUNTS MV E/A ratio:  0.63         Systemic VTI:  0.19 m  Systemic Diam: 1.80 cm Laurance Flatten MD Electronically signed by Laurance Flatten MD Signature Date/Time: 11/24/2022/10:27:28 AM    Final    DG Chest 2 View  Result Date: 11/23/2022 CLINICAL DATA:   Hemoptysis EXAM: CHEST - 2 VIEW COMPARISON:  11/22/2022 FINDINGS: Prior CABG. Heart and mediastinal contours within normal limits. Small left pleural effusion with left lower lobe atelectasis or infiltrate. Right lung clear. No acute bony abnormality. IMPRESSION: Small left pleural effusion with left lower lobe atelectasis or infiltrate. Electronically Signed   By: Charlett Nose M.D.   On: 11/23/2022 17:28    Procedures Procedures    Medications Ordered in ED Medications  iohexol (OMNIPAQUE) 350 MG/ML injection 75 mL (75 mLs Intravenous Contrast Given 11/22/22 1427)  HYDROcodone-acetaminophen (NORCO/VICODIN) 5-325 MG per tablet 1 tablet (1 tablet Oral Given 11/22/22 1731)    ED Course/ Medical Decision Making/ A&P                             Medical Decision Making Amount and/or Complexity of Data Reviewed Labs: ordered. Radiology: ordered.  Risk Prescription drug management.   77 year old male with medical history significant for recent CABG 1.5 weeks ago who presents to the emergency department from his rehab facility with a complaint of sore throat, nosebleed and coughing up blood since last night.  He endorses some discomfort in his throat 2 out of 10 in severity.  He states that he coughed up a blood clot earlier this morning that was moderate in size.  He has no further episodes of hemoptysis since being here in the emergency department.  On arrival, the patient was vitally stable.  Presenting with an episode of moderate volume hemoptysis, no further episodes since.  Considered bronchitis, lung mass, PE. Considered post intubation throat abrasion however the pt is over 1.5 weeks out from his surgery.  Laboratory evaluation significant for COVID-19 PCR testing negative, CBC without a leukocytosis, mild anemia to 11.0, CMP with mildly elevated serum creatinine to 1.26, INR 1.3.  The patient was administered Norco for pain control.  CT angiogram PE study was performed revealed the  following:   IMPRESSION:  1. No pulmonary emboli.  2. Moderate size pericardial effusion.  3. Extensive coronary artery calcification.  4. Bilateral pleural effusions layering dependently, larger on the  left than the right. Subtotal collapse of the left lower lobe.  5. Cirrhosis of the liver.    Aortic Atherosclerosis (ICD10-I70.0).    I spoke with Dr. Laneta Simmers of CT surgery regarding the patient's CT findings. He states that the findings are expected for the post-operative course. Did not feel any acute intervention was indicated, recommended outpatient follow-up. I considered admission for observation vs discharge in the setting of the patient's symptoms. He has been stable with no further episodes here in the ER over 7 hours. Will DC back to his facility, return precautions advised in the setting of recurrence as this would indicate need for admission for further monitoring and workup to include possible bronchoscopy.   Final Clinical Impression(s) / ED Diagnoses Final diagnoses:  Hemoptysis    Rx / DC Orders ED Discharge Orders     None           [name], MD 11/25/22 0011

## 2022-11-22 NOTE — ED Triage Notes (Addendum)
Per EMS, Pt, from [facility], c/o sore throat, nosebleed, and coughing up blood since last night.  Pain score 2/10.  Pt was discharged from hospital yesterday after having a CABG x 1.5 weeks ago.   No bleeding currently noted.  Pt thinks he was started on a new blood thinner.    Pt denies new chest pain or SOB.

## 2022-11-22 NOTE — ED Notes (Signed)
Report called in to [facility]. Satma Lpn took report

## 2022-11-22 NOTE — ED Notes (Addendum)
[facility] called twice at 1800 to give report sent to voice [phone]

## 2022-11-22 NOTE — Discharge Instructions (Addendum)
CT imaging was negative for evidence of pneumonia, lung mass or blood clot.  You were observed in the hospital emergency department for a total of 6 hours with no further episodes of coughing up blood.  We discussed your case with CT surgery and your CT imaging showed expected postoperative changes.  If you have any further episodes please return to the emergency department but at this time you are safe for discharge back to your facility.

## 2022-11-23 ENCOUNTER — Telehealth: Payer: Self-pay

## 2022-11-23 ENCOUNTER — Other Ambulatory Visit: Payer: Self-pay

## 2022-11-23 ENCOUNTER — Inpatient Hospital Stay (HOSPITAL_COMMUNITY)
Admission: EM | Admit: 2022-11-23 | Discharge: 2022-11-27 | DRG: 315 | Disposition: A | Discharge status: Skilled Nursing Facility | Payer: 59 | Attending: Family Medicine | Admitting: Family Medicine

## 2022-11-23 ENCOUNTER — Encounter (HOSPITAL_COMMUNITY): Payer: Self-pay

## 2022-11-23 ENCOUNTER — Emergency Department (HOSPITAL_COMMUNITY): Payer: 59

## 2022-11-23 DIAGNOSIS — I4510 Unspecified right bundle-branch block: Secondary | ICD-10-CM | POA: Diagnosis present

## 2022-11-23 DIAGNOSIS — Z951 Presence of aortocoronary bypass graft: Secondary | ICD-10-CM | POA: Diagnosis not present

## 2022-11-23 DIAGNOSIS — Z87891 Personal history of nicotine dependence: Secondary | ICD-10-CM

## 2022-11-23 DIAGNOSIS — K219 Gastro-esophageal reflux disease without esophagitis: Secondary | ICD-10-CM | POA: Diagnosis present

## 2022-11-23 DIAGNOSIS — I480 Paroxysmal atrial fibrillation: Secondary | ICD-10-CM | POA: Diagnosis present

## 2022-11-23 DIAGNOSIS — I2510 Atherosclerotic heart disease of native coronary artery without angina pectoris: Secondary | ICD-10-CM | POA: Diagnosis present

## 2022-11-23 DIAGNOSIS — E1122 Type 2 diabetes mellitus with diabetic chronic kidney disease: Secondary | ICD-10-CM | POA: Diagnosis present

## 2022-11-23 DIAGNOSIS — Z1152 Encounter for screening for COVID-19: Secondary | ICD-10-CM

## 2022-11-23 DIAGNOSIS — I3139 Other pericardial effusion (noninflammatory): Secondary | ICD-10-CM | POA: Diagnosis present

## 2022-11-23 DIAGNOSIS — Z79899 Other long term (current) drug therapy: Secondary | ICD-10-CM

## 2022-11-23 DIAGNOSIS — J9811 Atelectasis: Secondary | ICD-10-CM | POA: Diagnosis present

## 2022-11-23 DIAGNOSIS — F32A Depression, unspecified: Secondary | ICD-10-CM | POA: Diagnosis present

## 2022-11-23 DIAGNOSIS — Z8249 Family history of ischemic heart disease and other diseases of the circulatory system: Secondary | ICD-10-CM

## 2022-11-23 DIAGNOSIS — I129 Hypertensive chronic kidney disease with stage 1 through stage 4 chronic kidney disease, or unspecified chronic kidney disease: Secondary | ICD-10-CM | POA: Diagnosis present

## 2022-11-23 DIAGNOSIS — E1129 Type 2 diabetes mellitus with other diabetic kidney complication: Secondary | ICD-10-CM

## 2022-11-23 DIAGNOSIS — E785 Hyperlipidemia, unspecified: Secondary | ICD-10-CM | POA: Diagnosis present

## 2022-11-23 DIAGNOSIS — E119 Type 2 diabetes mellitus without complications: Secondary | ICD-10-CM

## 2022-11-23 DIAGNOSIS — N1831 Chronic kidney disease, stage 3a: Secondary | ICD-10-CM | POA: Diagnosis present

## 2022-11-23 DIAGNOSIS — Z8673 Personal history of transient ischemic attack (TIA), and cerebral infarction without residual deficits: Secondary | ICD-10-CM

## 2022-11-23 DIAGNOSIS — Z836 Family history of other diseases of the respiratory system: Secondary | ICD-10-CM

## 2022-11-23 DIAGNOSIS — Z8639 Personal history of other endocrine, nutritional and metabolic disease: Secondary | ICD-10-CM

## 2022-11-23 DIAGNOSIS — N400 Enlarged prostate without lower urinary tract symptoms: Secondary | ICD-10-CM | POA: Diagnosis present

## 2022-11-23 DIAGNOSIS — I9789 Other postprocedural complications and disorders of the circulatory system, not elsewhere classified: Secondary | ICD-10-CM | POA: Diagnosis present

## 2022-11-23 DIAGNOSIS — I10 Essential (primary) hypertension: Secondary | ICD-10-CM | POA: Diagnosis present

## 2022-11-23 DIAGNOSIS — K7460 Unspecified cirrhosis of liver: Secondary | ICD-10-CM | POA: Diagnosis present

## 2022-11-23 DIAGNOSIS — D696 Thrombocytopenia, unspecified: Secondary | ICD-10-CM | POA: Diagnosis present

## 2022-11-23 DIAGNOSIS — R042 Hemoptysis: Secondary | ICD-10-CM | POA: Diagnosis present

## 2022-11-23 DIAGNOSIS — Z7982 Long term (current) use of aspirin: Secondary | ICD-10-CM | POA: Diagnosis not present

## 2022-11-23 DIAGNOSIS — I252 Old myocardial infarction: Secondary | ICD-10-CM

## 2022-11-23 DIAGNOSIS — L89151 Pressure ulcer of sacral region, stage 1: Secondary | ICD-10-CM | POA: Diagnosis present

## 2022-11-23 LAB — CBC WITH DIFFERENTIAL/PLATELET
Abs Immature Granulocytes: 0.06 10*3/uL (ref 0.00–0.07)
Basophils Absolute: 0.1 10*3/uL (ref 0.0–0.1)
Basophils Relative: 1 %
Eosinophils Absolute: 0.5 10*3/uL (ref 0.0–0.5)
Eosinophils Relative: 4 %
HCT: 32.9 % — ABNORMAL LOW (ref 39.0–52.0)
Hemoglobin: 10.8 g/dL — ABNORMAL LOW (ref 13.0–17.0)
Immature Granulocytes: 1 %
Lymphocytes Relative: 12 %
Lymphs Abs: 1.3 10*3/uL (ref 0.7–4.0)
MCH: 29.5 pg (ref 26.0–34.0)
MCHC: 32.8 g/dL (ref 30.0–36.0)
MCV: 89.9 fL (ref 80.0–100.0)
Monocytes Absolute: 0.8 10*3/uL (ref 0.1–1.0)
Monocytes Relative: 8 %
Neutro Abs: 7.7 10*3/uL (ref 1.7–7.7)
Neutrophils Relative %: 74 %
Platelets: 305 10*3/uL (ref 150–400)
RBC: 3.66 MIL/uL — ABNORMAL LOW (ref 4.22–5.81)
RDW: 15.7 % — ABNORMAL HIGH (ref 11.5–15.5)
WBC: 10.5 10*3/uL (ref 4.0–10.5)
nRBC: 0 % (ref 0.0–0.2)

## 2022-11-23 LAB — CBC
HCT: 32 % — ABNORMAL LOW (ref 39.0–52.0)
Hemoglobin: 10.6 g/dL — ABNORMAL LOW (ref 13.0–17.0)
MCH: 29.7 pg (ref 26.0–34.0)
MCHC: 33.1 g/dL (ref 30.0–36.0)
MCV: 89.6 fL (ref 80.0–100.0)
Platelets: 287 10*3/uL (ref 150–400)
RBC: 3.57 MIL/uL — ABNORMAL LOW (ref 4.22–5.81)
RDW: 15.7 % — ABNORMAL HIGH (ref 11.5–15.5)
WBC: 8.4 10*3/uL (ref 4.0–10.5)
nRBC: 0 % (ref 0.0–0.2)

## 2022-11-23 LAB — BASIC METABOLIC PANEL
Anion gap: 12 (ref 5–15)
BUN: 13 mg/dL (ref 8–23)
CO2: 25 mmol/L (ref 22–32)
Calcium: 9.2 mg/dL (ref 8.9–10.3)
Chloride: 100 mmol/L (ref 98–111)
Creatinine, Ser: 1.26 mg/dL — ABNORMAL HIGH (ref 0.61–1.24)
GFR, Estimated: 59 mL/min — ABNORMAL LOW (ref >60)
Glucose, Bld: 112 mg/dL — ABNORMAL HIGH (ref 70–99)
Potassium: 4 mmol/L (ref 3.5–5.1)
Sodium: 137 mmol/L (ref 135–145)

## 2022-11-23 LAB — CREATININE, SERUM
Creatinine, Ser: 1.24 mg/dL (ref 0.61–1.24)
GFR, Estimated: 60 mL/min — ABNORMAL LOW (ref >60)

## 2022-11-23 LAB — BRAIN NATRIURETIC PEPTIDE: B Natriuretic Peptide: 369.3 pg/mL — ABNORMAL HIGH (ref 0.0–100.0)

## 2022-11-23 LAB — TROPONIN I (HIGH SENSITIVITY): Troponin I (High Sensitivity): 19 ng/L — ABNORMAL HIGH (ref <18)

## 2022-11-23 MED ORDER — ACETAMINOPHEN 325 MG PO TABS
650.0000 mg | ORAL_TABLET | Freq: Four times a day (QID) | ORAL | Status: DC | PRN
  Administered 2022-11-26 – 2022-11-27 (×3): 650 mg via ORAL
  Filled 2022-11-23 (×3): qty 2

## 2022-11-23 MED ORDER — ENOXAPARIN SODIUM 40 MG/0.4ML IJ SOSY
40.0000 mg | PREFILLED_SYRINGE | INTRAMUSCULAR | Status: DC
  Administered 2022-11-24 – 2022-11-26 (×4): 40 mg via SUBCUTANEOUS
  Filled 2022-11-23 (×4): qty 0.4

## 2022-11-23 MED ORDER — ATORVASTATIN CALCIUM 80 MG PO TABS
80.0000 mg | ORAL_TABLET | Freq: Every day | ORAL | Status: DC
  Administered 2022-11-24 – 2022-11-27 (×4): 80 mg via ORAL
  Filled 2022-11-23 (×2): qty 1
  Filled 2022-11-23: qty 2
  Filled 2022-11-23: qty 1

## 2022-11-23 MED ORDER — FUROSEMIDE 10 MG/ML IJ SOLN
20.0000 mg | Freq: Once | INTRAMUSCULAR | Status: AC
  Administered 2022-11-23: 20 mg via INTRAVENOUS
  Filled 2022-11-23: qty 2

## 2022-11-23 MED ORDER — METOPROLOL TARTRATE 25 MG PO TABS
25.0000 mg | ORAL_TABLET | Freq: Two times a day (BID) | ORAL | Status: DC
  Administered 2022-11-24 – 2022-11-27 (×8): 25 mg via ORAL
  Filled 2022-11-23 (×8): qty 1

## 2022-11-23 MED ORDER — ASPIRIN 325 MG PO TBEC
325.0000 mg | DELAYED_RELEASE_TABLET | Freq: Every day | ORAL | Status: DC
  Administered 2022-11-24 – 2022-11-27 (×4): 325 mg via ORAL
  Filled 2022-11-23 (×4): qty 1

## 2022-11-23 MED ORDER — GABAPENTIN 300 MG PO CAPS
300.0000 mg | ORAL_CAPSULE | Freq: Every day | ORAL | Status: DC
  Administered 2022-11-24 – 2022-11-26 (×4): 300 mg via ORAL
  Filled 2022-11-23 (×4): qty 1

## 2022-11-23 MED ORDER — AMIODARONE HCL 200 MG PO TABS
200.0000 mg | ORAL_TABLET | Freq: Two times a day (BID) | ORAL | Status: DC
  Administered 2022-11-24 – 2022-11-27 (×8): 200 mg via ORAL
  Filled 2022-11-23 (×8): qty 1

## 2022-11-23 MED ORDER — PAROXETINE HCL 20 MG PO TABS
40.0000 mg | ORAL_TABLET | ORAL | Status: DC
  Administered 2022-11-24 – 2022-11-27 (×4): 40 mg via ORAL
  Filled 2022-11-23 (×5): qty 2

## 2022-11-23 MED ORDER — LOSARTAN POTASSIUM 50 MG PO TABS
100.0000 mg | ORAL_TABLET | Freq: Every day | ORAL | Status: DC
  Administered 2022-11-24 – 2022-11-27 (×4): 100 mg via ORAL
  Filled 2022-11-23 (×4): qty 2

## 2022-11-23 MED ORDER — ACETAMINOPHEN 650 MG RE SUPP: 650.0000 mg | Freq: Four times a day (QID) | RECTAL | Status: DC | PRN

## 2022-11-23 MED ORDER — HYDRALAZINE HCL 25 MG PO TABS
25.0000 mg | ORAL_TABLET | Freq: Three times a day (TID) | ORAL | Status: DC
  Administered 2022-11-24 – 2022-11-27 (×10): 25 mg via ORAL
  Filled 2022-11-23 (×11): qty 1

## 2022-11-23 MED ORDER — FENOFIBRATE 160 MG PO TABS
160.0000 mg | ORAL_TABLET | Freq: Every day | ORAL | Status: DC
  Administered 2022-11-25 – 2022-11-27 (×3): 160 mg via ORAL
  Filled 2022-11-23 (×4): qty 1

## 2022-11-23 MED ORDER — OXYCODONE HCL 5 MG PO TABS
5.0000 mg | ORAL_TABLET | ORAL | Status: DC | PRN
  Administered 2022-11-23 – 2022-11-27 (×10): 5 mg via ORAL
  Filled 2022-11-23 (×10): qty 1

## 2022-11-23 NOTE — H&P (Signed)
History and Physical    Zaine Elsass JWJ:[id number] DOB: [birth year] DOA: 11/23/2022  PCP: Corwin Levins, MD   Chief Complaint: hemoptysis  HPI: Collin Bridges is a 77 y.o. male with medical history significant of CAD status post recent CABG, CHF, type 2 diabetes, GERD, hypertension, prior stroke who presented to the emergency department due to 2 days of hemoptysis.  He was seen in the ER yesterday and underwent a workup which included CT chest which was largely unrevealing.  He was discharged with outpatient follow-up.  Since discharging from the emergency department yesterday he has developed persistent gross bloody sputum he presents today for further workup.  On arrival he was afebrile and hemodynamically stable.  Labs were obtained which revealed old WBC 10.5, hemoglobin 10.8 at baseline, creatinine 1.2, troponin 19, BNP 369.  Patient had intraoperative echocardiogram on 6/27.  He has not had imaging of his heart stents.  He had a chest x-ray today which showed small left pleural effusion.  CT angiogram pulmonary embolism study on visit to ER yesterday showed moderate-sized pericardial effusion with bilateral pleural effusions and cirrhosis of the liver.   Review of Systems: Review of Systems  All other systems reviewed and are negative.    As per HPI otherwise 10 point review of systems negative.   No Known Allergies  Past Medical History:  Diagnosis Date   Allergy    seasonal   Anginal pain (HCC)    Anxiety    CHF (congestive heart failure) (HCC)    Coronary artery disease    Depression    Diabetes mellitus without complication (HCC)    Type 2   GERD (gastroesophageal reflux disease)    Pt states "take ambien for this"   Hepatitis C    HLD (hyperlipidemia) 08/09/2017   Hypertension    Hypoglycemia    Pneumonia 06/2022   Stroke (HCC) 03/2021   TIA    Past Surgical History:  Procedure Laterality Date   APPENDECTOMY     BUBBLE STUDY  06/16/2021    Procedure: BUBBLE STUDY;  Surgeon: Sande Rives, MD;  Location: [facility] ENDOSCOPY;  Service: Cardiovascular;;   COLONOSCOPY     CORONARY ARTERY BYPASS GRAFT N/A 11/10/2022   Procedure: CORONARY ARTERY BYPASS GRAFTING (CABG) X5 USING LEFT INTERNAL MAMMARY ARTERY (LIMA) AND ENDOSCOPICALLY HARVESTED RIGHT AND LEFT GREATER SAPHENOUS VEIN;  Surgeon: Eugenio Hoes, MD;  Location: [facility] OR;  Service: Open Heart Surgery;  Laterality: N/A;   FRACTURE SURGERY Left    Ankle   LEFT HEART CATH AND CORONARY ANGIOGRAPHY N/A 07/12/2022   Procedure: LEFT HEART CATH AND CORONARY ANGIOGRAPHY;  Surgeon: Marykay Lex, MD;  Location: [facility] INVASIVE CV LAB;  Service: Cardiovascular;  Laterality: N/A;   TEE WITHOUT CARDIOVERSION N/A 06/16/2021   Procedure: TRANSESOPHAGEAL ECHOCARDIOGRAM (TEE);  Surgeon: Sande Rives, MD;  Location: [facility] ENDOSCOPY;  Service: Cardiovascular;  Laterality: N/A;   TEE WITHOUT CARDIOVERSION N/A 11/10/2022   Procedure: TRANSESOPHAGEAL ECHOCARDIOGRAM;  Surgeon: Eugenio Hoes, MD;  Location: [facility] OR;  Service: Open Heart Surgery;  Laterality: N/A;   TONSILECTOMY, ADENOIDECTOMY, BILATERAL MYRINGOTOMY AND TUBES  1955     reports that he has quit smoking. He has never used smokeless tobacco. He reports that he does not currently use alcohol. He reports that he does not use drugs.  Family History  Problem Relation Age of Onset   Lung disease Mother    Hyperlipidemia Father    Hypertension Father     Prior to Admission  medications   Medication Sig Start Date End Date Taking? Authorizing Provider  amiodarone (PACERONE) 200 MG tablet Take 1 tablet (200 mg total) by mouth 2 (two) times daily. X 7 days, then decrease to 200 mg daily 11/19/22   Barrett, Rae Roam, PA-C  aspirin EC 325 MG tablet Take 1 tablet (325 mg total) by mouth daily. 11/19/22   Barrett, Erin R, PA-C  atorvastatin (LIPITOR) 80 MG tablet Take 1 tablet (80 mg total) by mouth daily. 07/14/22 11/22/22  Narda Bonds, MD   cholecalciferol (VITAMIN D3) 25 MCG (1000 UNIT) tablet Take 1 tablet (1,000 Units total) by mouth daily. Take 1,000 Units by mouth daily. 11/22/21   Corwin Levins, MD  Evolocumab (REPATHA SURECLICK) 140 MG/ML SOAJ Inject 140 mg into the skin every 14 days. 10/06/22   O'NealRonnald Ramp, MD  fenofibrate 160 MG tablet Take 1 tablet (160 mg total) by mouth daily. 11/09/22   Corwin Levins, MD  fluticasone (FLONASE) 50 MCG/ACT nasal spray Place 2 sprays into both nostrils daily. 11/09/22   Corwin Levins, MD  gabapentin (NEURONTIN) 300 MG capsule Take 1 capsule (300 mg total) by mouth at bedtime. 08/08/22   Plotnikov, Georgina Quint, MD  hydrALAZINE (APRESOLINE) 25 MG tablet Take 1 tablet (25 mg total) by mouth every 8 (eight) hours. 10/11/22 01/09/23  Corwin Levins, MD  losartan (COZAAR) 100 MG tablet Take 1 tablet (100 mg total) by mouth daily. 11/09/22   Corwin Levins, MD  magnesium oxide (MAG-OX) 400 (240 Mg) MG tablet Take 400 mg by mouth daily.    [provider]  metFORMIN (GLUCOPHAGE-XR) 500 MG 24 hr tablet Take 1 tablet (500 mg) by mouth daily with breakfast. 11/09/22   Corwin Levins, MD  metoprolol tartrate (LOPRESSOR) 25 MG tablet Take 1 tablet (25 mg total) by mouth 2 (two) times daily. 11/19/22 03/19/23  Barrett, Erin R, PA-C  nitroGLYCERIN (NITROSTAT) 0.4 MG SL tablet Place 1 tablet under the tongue every 5 minutes as needed for chest pain. 10/06/22 01/04/23  Sande Rives, MD  PARoxetine (PAXIL) 40 MG tablet Take 1 tablet (40 mg total) by mouth every morning. 11/09/22   Corwin Levins, MD  vitamin E 180 MG (400 UNITS) capsule Take 400 Units by mouth daily.    [provider]  zolpidem (AMBIEN) 10 MG tablet TAKE 1 TABLET(10 MG) BY MOUTH AT BEDTIME AS NEEDED FOR SLEEP Patient taking differently: Take 5-10 mg by mouth at bedtime. 09/22/22   Corwin Levins, MD    Physical Exam: Vitals:   11/23/22 1637 11/23/22 2105 11/23/22 2134  BP:   (!) 164/87  Pulse:   78  Resp:   18  Temp:   98.2 F (36.8 C)   TempSrc:  Oral   SpO2:   99%  Weight: 77.1 kg    Height: 6' (1.829 m)     Physical Exam Vitals reviewed.  Constitutional:      Appearance: He is normal weight.  HENT:     Right Ear: Tympanic membrane normal.     Nose: Nose normal.     Mouth/Throat:     Mouth: Mucous membranes are moist.     Pharynx: Oropharynx is clear.  Eyes:     Pupils: Pupils are equal, round, and reactive to light.  Cardiovascular:     Rate and Rhythm: Normal rate.  Pulmonary:     Breath sounds: Normal breath sounds.  Abdominal:     General: Abdomen  is flat. Bowel sounds are normal.  Musculoskeletal:     Cervical back: Normal range of motion.  Skin:    General: Skin is warm.     Coloration: Skin is not jaundiced.  Neurological:     General: No focal deficit present.     Mental Status: He is alert.  Psychiatric:        Mood and Affect: Mood normal.       Labs on Admission: I have personally reviewed the patients's labs and imaging studies.  Assessment/Plan Principal Problem:   Pericardial effusion   # Hemoptysis most likely per surgical related to recent intubation - Patient endorsing 2 to 3 days of hemoptysis.  He has never had this before.  Denies history of smoking, weight loss or B symptoms. -CT chest largely unrevealing for etiology  Plan: N.p.o. at midnight in event of need for bronch  Trend CBC Will need pulm consult for assessment PPI  # Pericardial effusion is likely secondary to recent surgery # Bilateral pleural effusions - Obtain TTE to assess heart function #CAD s/p CABG- continue ASA  #HLD- continue fibrate, statin  #HTN- continue hydralazine, losartan, metoprolol  #Paroxysmal afib- continue metoprolol, amiodarone  #Depression- continue paxil     Admission status: Inpatient Progressive  Certification: The appropriate patient status for this patient is INPATIENT. Inpatient status is judged to be reasonable and necessary in order to provide  the required intensity of service to ensure the patient's safety. The patient's presenting symptoms, physical exam findings, and initial radiographic and laboratory data in the context of their chronic comorbidities is felt to place them at high risk for further clinical deterioration. Furthermore, it is not anticipated that the patient will be medically stable for discharge from the hospital within 2 midnights of admission.   * I certify that at the point of admission it is my clinical judgment that the patient will require inpatient hospital care spanning beyond 2 midnights from the point of admission due to high intensity of service, high risk for further deterioration and high frequency of surveillance required.Alan Mulder MD [facility] If 7PM-7AM, please contact night-coverage [email or web address]  11/23/2022, 11:25 PM

## 2022-11-23 NOTE — ED Provider Notes (Signed)
EMERGENCY DEPARTMENT AT [facility] Provider Note   CSN: [id number] Arrival date & time: 11/23/22  1629     History  Chief Complaint  Patient presents with   Hemoptysis    Hemoptysis post surgery    Collin Bridges is a 77 y.o. male.  77 yo M with a chief complaints of hemoptysis.  He tells me has been going on for couple days.  He was seen in the ER setting yesterday and had CT imaging and was eventually discharged home.  He tells me that his got a bit worse today.  Has had more gross blood that he is coughed up.  He said it started with nausea and vomiting that he thinks might have been bloody.  He denies dark or bloody stools.  Denies abdominal pain.  He is recently postop 5 vessel bypass surgery.  He has a history of cirrhosis, he denies any history of GI bleeding.        Home Medications Prior to Admission medications   Medication Sig Start Date End Date Taking? Authorizing Provider  amiodarone (PACERONE) 200 MG tablet Take 1 tablet (200 mg total) by mouth 2 (two) times daily. X 7 days, then decrease to 200 mg daily 11/19/22   Barrett, Rae Roam, PA-C  aspirin EC 325 MG tablet Take 1 tablet (325 mg total) by mouth daily. 11/19/22   Barrett, Erin R, PA-C  atorvastatin (LIPITOR) 80 MG tablet Take 1 tablet (80 mg total) by mouth daily. 07/14/22 11/22/22  Narda Bonds, MD  cholecalciferol (VITAMIN D3) 25 MCG (1000 UNIT) tablet Take 1 tablet (1,000 Units total) by mouth daily. Take 1,000 Units by mouth daily. 11/22/21   Corwin Levins, MD  Evolocumab (REPATHA SURECLICK) 140 MG/ML SOAJ Inject 140 mg into the skin every 14 days. 10/06/22   O'NealRonnald Ramp, MD  fenofibrate 160 MG tablet Take 1 tablet (160 mg total) by mouth daily. 11/09/22   Corwin Levins, MD  fluticasone (FLONASE) 50 MCG/ACT nasal spray Place 2 sprays into both nostrils daily. 11/09/22   Corwin Levins, MD  gabapentin (NEURONTIN) 300 MG capsule Take 1 capsule (300 mg total) by mouth at bedtime.  08/08/22   Plotnikov, Georgina Quint, MD  hydrALAZINE (APRESOLINE) 25 MG tablet Take 1 tablet (25 mg total) by mouth every 8 (eight) hours. 10/11/22 01/09/23  Corwin Levins, MD  losartan (COZAAR) 100 MG tablet Take 1 tablet (100 mg total) by mouth daily. 11/09/22   Corwin Levins, MD  magnesium oxide (MAG-OX) 400 (240 Mg) MG tablet Take 400 mg by mouth daily.    [provider]  metFORMIN (GLUCOPHAGE-XR) 500 MG 24 hr tablet Take 1 tablet (500 mg) by mouth daily with breakfast. 11/09/22   Corwin Levins, MD  metoprolol tartrate (LOPRESSOR) 25 MG tablet Take 1 tablet (25 mg total) by mouth 2 (two) times daily. 11/19/22 03/19/23  Barrett, Erin R, PA-C  nitroGLYCERIN (NITROSTAT) 0.4 MG SL tablet Place 1 tablet under the tongue every 5 minutes as needed for chest pain. 10/06/22 01/04/23  Sande Rives, MD  PARoxetine (PAXIL) 40 MG tablet Take 1 tablet (40 mg total) by mouth every morning. 11/09/22   Corwin Levins, MD  vitamin E 180 MG (400 UNITS) capsule Take 400 Units by mouth daily.    [provider]  zolpidem (AMBIEN) 10 MG tablet TAKE 1 TABLET(10 MG) BY MOUTH AT BEDTIME AS NEEDED FOR SLEEP Patient taking differently: Take 5-10 mg  by mouth at bedtime. 09/22/22   Corwin Levins, MD      Allergies    Patient has no known allergies.    Review of Systems   Review of Systems  Physical Exam Updated Vital Signs Ht 6' (1.829 m)   Wt 77.1 kg   BMI 23.06 kg/m  Physical Exam Vitals and nursing note reviewed.  Constitutional:      Appearance: He is well-developed.     Comments: pale  HENT:     Head: Normocephalic and atraumatic.  Eyes:     Pupils: Pupils are equal, round, and reactive to light.  Neck:     Vascular: No JVD.  Cardiovascular:     Rate and Rhythm: Normal rate and regular rhythm.     Heart sounds: No murmur heard.    No friction rub. No gallop.     Comments: Patient has sternotomy incision clean dry and intact.  He has 2 incisions to the upper abdomen that are also clean  dry and intact.  No surrounding erythema no drainage no induration. Pulmonary:     Effort: No respiratory distress.     Breath sounds: No wheezing.  Abdominal:     General: There is no distension.     Tenderness: There is no abdominal tenderness. There is no guarding or rebound.  Musculoskeletal:        General: Normal range of motion.     Cervical back: Normal range of motion and neck supple.     Comments: Trace bilateral lower extremity edema  Skin:    Coloration: Skin is not pale.     Findings: No rash.  Neurological:     Mental Status: He is alert and oriented to person, place, and time.  Psychiatric:        Behavior: Behavior normal.     ED Results / Procedures / Treatments   Labs (all labs ordered are listed, but only abnormal results are displayed) Labs Reviewed  CBC WITH DIFFERENTIAL/PLATELET - Abnormal; Notable for the following components:      Result Value   RBC 3.66 (*)    Hemoglobin 10.8 (*)    HCT 32.9 (*)    RDW 15.7 (*)    All other components within normal limits  BASIC METABOLIC PANEL - Abnormal; Notable for the following components:   Glucose, Bld 112 (*)    Creatinine, Ser 1.26 (*)    GFR, Estimated 59 (*)    All other components within normal limits  BRAIN NATRIURETIC PEPTIDE - Abnormal; Notable for the following components:   B Natriuretic Peptide 369.3 (*)    All other components within normal limits  TROPONIN I (HIGH SENSITIVITY) - Abnormal; Notable for the following components:   Troponin I (High Sensitivity) 19 (*)    All other components within normal limits    EKG EKG Interpretation Date/Time:  Wednesday November 23 2022 18:55:26 EDT Ventricular Rate:  72 PR Interval:  122 QRS Duration:  138 QT Interval:  504 QTC Calculation: 551 R Axis:   4  Text Interpretation: Normal sinus rhythm Right bundle branch block T wave abnormality, consider lateral ischemia Abnormal ECG No significant change since last tracing Confirmed by Melene Plan [phone])  on 11/23/2022 8:09:45 PM  Radiology DG Chest 2 View  Result Date: 11/23/2022 CLINICAL DATA:  Hemoptysis EXAM: CHEST - 2 VIEW COMPARISON:  11/22/2022 FINDINGS: Prior CABG. Heart and mediastinal contours within normal limits. Small left pleural effusion with left lower lobe atelectasis or infiltrate. Right  lung clear. No acute bony abnormality. IMPRESSION: Small left pleural effusion with left lower lobe atelectasis or infiltrate. Electronically Signed   By: Charlett Nose M.D.   On: 11/23/2022 17:28   CT Angio Chest PE W and/or Wo Contrast  Result Date: 11/22/2022 CLINICAL DATA:  Pulmonary embolism suspected. High probability. Cough with some hemoptysis. EXAM: CT ANGIOGRAPHY CHEST WITH CONTRAST TECHNIQUE: Multidetector CT imaging of the chest was performed using the standard protocol during bolus administration of intravenous contrast. Multiplanar CT image reconstructions and MIPs were obtained to evaluate the vascular anatomy. RADIATION DOSE REDUCTION: This exam was performed according to the departmental dose-optimization program which includes automated exposure control, adjustment of the mA and/or kV according to patient size and/or use of iterative reconstruction technique. CONTRAST:  75mL OMNIPAQUE IOHEXOL 350 MG/ML SOLN COMPARISON:  Radiography same day.  Previous chest CT 07/11/2022. FINDINGS: Cardiovascular: Heart size upper limits of normal. Moderate size pericardial effusion is present. There is extensive coronary artery calcification. There is aortic atherosclerotic calcification. Pulmonary arterial opacification is good. No pulmonary emboli are seen. Mediastinum/Nodes: No mass or adenopathy. Lungs/Pleura: Bilateral pleural effusions layering dependently, larger on the left than the right. Mild dependent atelectasis in the right lower lobe. Subtotal collapse of the left lower lobe. Previously seen left lower lobe nodule cannot be evaluated given the pulmonary collapse. Upper Abdomen: Pronounced  cirrhosis of the liver. No acute upper abdominal finding. Musculoskeletal: No significant finding. Review of the MIP images confirms the above findings. IMPRESSION: 1. No pulmonary emboli. 2. Moderate size pericardial effusion. 3. Extensive coronary artery calcification. 4. Bilateral pleural effusions layering dependently, larger on the left than the right. Subtotal collapse of the left lower lobe. 5. Cirrhosis of the liver. Aortic Atherosclerosis (ICD10-I70.0). Electronically Signed   By: Paulina Fusi M.D.   On: 11/22/2022 14:52   DG Chest Portable 1 View  Result Date: 11/22/2022 CLINICAL DATA:  Cough EXAM: PORTABLE CHEST 1 VIEW COMPARISON:  11/17/2022 FINDINGS: Previous median sternotomy and CABG. The right lung is clear except for mild atelectasis at the base. On the left, there is more pronounced density in the left lower chest that could be due to a combination of left lower lobe collapse/infiltrate and pleural fluid. IMPRESSION: Abnormal density in the left lower chest that could be due to a combination of left lower lobe collapse/infiltrate and pleural fluid. Electronically Signed   By: Paulina Fusi M.D.   On: 11/22/2022 14:47    Procedures Procedures    Medications Ordered in ED Medications  furosemide (LASIX) injection 20 mg (has no administration in time range)    ED Course/ Medical Decision Making/ A&P                             Medical Decision Making Amount and/or Complexity of Data Reviewed Labs: ordered. Radiology: ordered. ECG/medicine tests: ordered.  Risk Prescription drug management. Decision regarding hospitalization.   77 yo M with a chief complaints of hemoptysis.  Going on since yesterday.  Yesterday he was seen in the emergency department setting and had a CT scan of the chest that was negative for pulmonary embolism.  It did show a pericardial effusion as well as bilateral pleural effusions.  Chest x-ray repeated today without significant change from prior.  He  does feel like things are getting worse.  Will repeat blood work.  Will discuss with cardiology.  Patient's hemoglobin is essentially unchanged.  No significant electrolyte abnormalities.  Troponin  is very minimally elevated.  BNP is minimally elevated as well.  I discussed case with Dr. Jayme Cloud, cardiology fellow on-call.  He was not told that this was specifically from the procedure that he had performed.  He did not feel it would be unreasonable to admit him overnight for observation.  He did recommend a bolus dose of Lasix.  Will discuss with medicine.  The patients results and plan were reviewed and discussed.   Any x-rays performed were independently reviewed by myself.   Differential diagnosis were considered with the presenting HPI.  Medications  furosemide (LASIX) injection 20 mg (has no administration in time range)    Vitals:   11/23/22 1637  Weight: 77.1 kg  Height: 6' (1.829 m)    Final diagnoses:  Hemoptysis    Admission/ observation were discussed with the admitting physician, patient and/or family and they are comfortable with the plan.           Final Clinical Impression(s) / ED Diagnoses Final diagnoses:  Hemoptysis    Rx / DC Orders ED Discharge Orders     None         Melene Plan, DO 11/23/22 2237

## 2022-11-23 NOTE — Transitions of Care (Post Inpatient/ED Visit) (Signed)
11/23/2022  Name: Collin Bridges MRN: [id number] DOB: 14-Feb-1946  Today's TOC FU Call Status: Today's TOC FU Call Status:: Successful TOC FU Call Competed TOC FU Call Complete Date: 11/23/22  Transition Care Management Follow-up Telephone Call Date of Discharge: 11/22/22 Discharge Facility: [facility]) Type of Discharge: Emergency Department Reason for ED Visit: Other: (hemoptysis) How have you been since you were released from the hospital?: Better Any questions or concerns?: No  Items Reviewed: Did you receive and understand the discharge instructions provided?: Yes Medications obtained,verified, and reconciled?: Yes (Medications Reviewed) Any new allergies since your discharge?: No Dietary orders reviewed?: Yes Do you have support at home?: Yes People in Home: facility resident Name of Support/Comfort Primary Source: patient in rehab  Medications Reviewed Today: Medications Reviewed Today     Reviewed by Card, Bryson Ha, CPhT (Pharmacy Technician) on 11/22/22 at 1802  Med List Status: Complete   Medication Order Taking? Sig Documenting Provider Last Dose Status Informant  amiodarone (PACERONE) 200 MG tablet [id number] Yes Take 1 tablet (200 mg total) by mouth 2 (two) times daily. X 7 days, then decrease to 200 mg daily Barrett, Rae Roam, PA-C 11/22/2022 Active Nursing Home Medication Administration Guide (MAG), Pharmacy Records  aspirin EC 325 MG tablet [id number] Yes Take 1 tablet (325 mg total) by mouth daily. Zada Girt 11/21/2022 Active Nursing Home Medication Administration Guide (MAG), Pharmacy Records  atorvastatin (LIPITOR) 80 MG tablet [id number] Yes Take 1 tablet (80 mg total) by mouth daily. Narda Bonds, MD 11/21/2022 Active Nursing Home Medication Administration Guide (MAG), Pharmacy Records  cholecalciferol (VITAMIN D3) 25 MCG (1000 UNIT) tablet [id number] Yes Take 1 tablet (1,000 Units total) by mouth daily. Take 1,000 Units by mouth daily. Corwin Levins, MD 11/22/2022 Active Nursing Home Medication Administration Guide (MAG), Pharmacy Records  Evolocumab [facility]) 140 MG/ML Ivory Broad [id number] Yes Inject 140 mg into the skin every 14 days. Sande Rives, MD unknown Active Nursing Home Medication Administration Guide (MAG), Pharmacy Records  fenofibrate 160 MG tablet [id number] Yes Take 1 tablet (160 mg total) by mouth daily. Corwin Levins, MD 11/22/2022 Active Nursing Home Medication Administration Guide (MAG), Pharmacy Records  fluticasone [facility]) 50 MCG/ACT nasal spray [id number] Yes Place 2 sprays into both nostrils daily. Corwin Levins, MD 11/22/2022 Active Nursing Home Medication Administration Guide (MAG), Pharmacy Records  gabapentin (NEURONTIN) 300 MG capsule [id number] Yes Take 1 capsule (300 mg total) by mouth at bedtime. Plotnikov, Georgina Quint, MD 11/21/2022 Active Nursing Home Medication Administration Guide (MAG), Pharmacy Records  hydrALAZINE (APRESOLINE) 25 MG tablet [id number] Yes Take 1 tablet (25 mg total) by mouth every 8 (eight) hours. Corwin Levins, MD 11/22/2022 Active Nursing Home Medication Administration Guide (MAG), Pharmacy Records  losartan (COZAAR) 100 MG tablet [id number] Yes Take 1 tablet (100 mg total) by mouth daily. Corwin Levins, MD 11/22/2022 Active Nursing Home Medication Administration Guide (MAG), Pharmacy Records  magnesium oxide (MAG-OX) 400 (240 Mg) MG tablet [id number] Yes Take 400 mg by mouth daily. [provider] 11/22/2022 Active Nursing Home Medication Administration Guide (MAG), Pharmacy Records  metFORMIN (GLUCOPHAGE-XR) 500 MG 24 hr tablet [id number] Yes Take 1 tablet (500 mg) by mouth daily with breakfast. Corwin Levins, MD 11/22/2022 Active Nursing Home Medication Administration Guide (MAG), Pharmacy Records  metoprolol tartrate (LOPRESSOR) 25 MG tablet [id number] Yes Take 1 tablet (25 mg total) by mouth 2 (two) times daily. Zada Girt 11/22/2022 Active Nursing Home Medication Administration  Guide (  MAG), Pharmacy Records  nitroGLYCERIN (NITROSTAT) 0.4 MG SL tablet [id number] Yes Place 1 tablet under the tongue every 5 minutes as needed for chest pain. Sande Rives, MD unknown Active Nursing Home Medication Administration Guide (MAG), Pharmacy Records  PARoxetine (PAXIL) 40 MG tablet [id number] Yes Take 1 tablet (40 mg total) by mouth every morning. Corwin Levins, MD 11/22/2022 Active Nursing Home Medication Administration Guide (MAG), Pharmacy Records  vitamin E 180 MG (400 UNITS) capsule [id number] Yes Take 400 Units by mouth daily. [provider] 11/22/2022 Active Nursing Home Medication Administration Guide (MAG), Pharmacy Records  zolpidem (AMBIEN) 10 MG tablet [id number] Yes TAKE 1 TABLET(10 MG) BY MOUTH AT BEDTIME AS NEEDED FOR SLEEP  Patient taking differently: Take 5-10 mg by mouth at bedtime.   Corwin Levins, MD unknown Active Nursing Home Medication Administration Guide [facility]), Pharmacy Records  Med List Note (Card, Bryson Ha, CPhT 11/22/22 1758): [facility] [phone]            Home Care and Equipment/Supplies: Were Home Health Services Ordered?: NA Any new equipment or medical supplies ordered?: NA  Functional Questionnaire: Do you need assistance with bathing/showering or dressing?: Yes Do you need assistance with meal preparation?: Yes Do you need assistance with eating?: No Do you have difficulty maintaining continence: No Do you need assistance with getting out of bed/getting out of a chair/moving?: No Do you have difficulty managing or taking your medications?: No  Follow up appointments reviewed: PCP Follow-up appointment confirmed?: NA (patient in [facility] rehab) [facility] Follow-up appointment confirmed?: NA Do you need transportation to your follow-up appointment?: No Do you understand care options if your condition(s) worsen?: Yes-patient verbalized understanding    SIGNATURE Karena Addison, LPN [facility] Nurse Health Advisor Direct Dial [phone]

## 2022-11-23 NOTE — ED Triage Notes (Signed)
Pt stated he's been coughing up blood since he had his cabbage. Pt was seen a day ago for the same thing.

## 2022-11-24 ENCOUNTER — Inpatient Hospital Stay (HOSPITAL_COMMUNITY): Payer: 59

## 2022-11-24 DIAGNOSIS — I3139 Other pericardial effusion (noninflammatory): Secondary | ICD-10-CM

## 2022-11-24 LAB — GLUCOSE, CAPILLARY
Glucose-Capillary: 138 mg/dL — ABNORMAL HIGH (ref 70–99)
Glucose-Capillary: 126 mg/dL — ABNORMAL HIGH (ref 70–99)

## 2022-11-24 LAB — CBG MONITORING, ED: Glucose-Capillary: 107 mg/dL — ABNORMAL HIGH (ref 70–99)

## 2022-11-24 LAB — PROCALCITONIN: Procalcitonin: <0.1 ng/mL

## 2022-11-24 LAB — CBC
HCT: 33.2 % — ABNORMAL LOW (ref 39.0–52.0)
Hemoglobin: 11 g/dL — ABNORMAL LOW (ref 13.0–17.0)
MCH: 30.1 pg (ref 26.0–34.0)
MCHC: 33.1 g/dL (ref 30.0–36.0)
MCV: 90.7 fL (ref 80.0–100.0)
Platelets: 289 10*3/uL (ref 150–400)
RBC: 3.66 MIL/uL — ABNORMAL LOW (ref 4.22–5.81)
RDW: 15.7 % — ABNORMAL HIGH (ref 11.5–15.5)
WBC: 8.6 10*3/uL (ref 4.0–10.5)
nRBC: 0 % (ref 0.0–0.2)

## 2022-11-24 LAB — ECHOCARDIOGRAM COMPLETE
AR max vel: 0.97 cm2
AV Area VTI: 1.08 cm2
AV Area mean vel: 1.02 cm2
AV Mean grad: 10.4 mmHg
AV Peak grad: 23 mmHg
Ao pk vel: 2.4 m/s
Area-P 1/2: 2.37 cm2
Height: 72 in
S' Lateral: 2.2 cm
Weight: 2720 oz

## 2022-11-24 LAB — TROPONIN I (HIGH SENSITIVITY)
Troponin I (High Sensitivity): 19 ng/L — ABNORMAL HIGH (ref <18)
Troponin I (High Sensitivity): 17 ng/L (ref <18)
Troponin I (High Sensitivity): 18 ng/L — ABNORMAL HIGH (ref <18)

## 2022-11-24 LAB — BRAIN NATRIURETIC PEPTIDE: B Natriuretic Peptide: 329.6 pg/mL — ABNORMAL HIGH (ref 0.0–100.0)

## 2022-11-24 MED ORDER — INSULIN ASPART 100 UNIT/ML IJ SOLN: 0.0000 [IU] | Freq: Every day | INTRAMUSCULAR | Status: DC

## 2022-11-24 MED ORDER — INSULIN ASPART 100 UNIT/ML IJ SOLN
0.0000 [IU] | Freq: Three times a day (TID) | INTRAMUSCULAR | Status: DC
  Administered 2022-11-24 – 2022-11-27 (×7): 1 [IU] via SUBCUTANEOUS

## 2022-11-24 MED ORDER — ZOLPIDEM TARTRATE 5 MG PO TABS
5.0000 mg | ORAL_TABLET | Freq: Every evening | ORAL | Status: DC | PRN
  Administered 2022-11-24 – 2022-11-26 (×3): 5 mg via ORAL
  Filled 2022-11-24 (×3): qty 1

## 2022-11-24 MED ORDER — ORAL CARE MOUTH RINSE: 15.0000 mL | OROMUCOSAL | Status: DC | PRN

## 2022-11-24 MED ORDER — PANTOPRAZOLE SODIUM 40 MG PO TBEC
40.0000 mg | DELAYED_RELEASE_TABLET | Freq: Every day | ORAL | Status: DC
  Administered 2022-11-24 – 2022-11-27 (×4): 40 mg via ORAL
  Filled 2022-11-24 (×4): qty 1

## 2022-11-24 MED FILL — Mannitol IV Soln 20%: INTRAVENOUS | Qty: 500 | Status: AC

## 2022-11-24 MED FILL — Heparin Sodium (Porcine) Inj 1000 Unit/ML: INTRAMUSCULAR | Qty: 10 | Status: AC

## 2022-11-24 MED FILL — Albumin, Human Inj 5%: INTRAVENOUS | Qty: 250 | Status: AC

## 2022-11-24 MED FILL — Sodium Chloride IV Soln 0.9%: INTRAVENOUS | Qty: 2000 | Status: AC

## 2022-11-24 MED FILL — Electrolyte-R (PH 7.4) Solution: INTRAVENOUS | Qty: 5000 | Status: AC

## 2022-11-24 NOTE — Plan of Care (Signed)
  Problem: Education: Goal: Knowledge of General Education information will improve Description Including pain rating scale, medication(s)/side effects and non-pharmacologic comfort measures Outcome: Progressing   Problem: Health Behavior/Discharge Planning: Goal: Ability to manage health-related needs will improve Outcome: Progressing   

## 2022-11-24 NOTE — Progress Notes (Signed)
1925 patient alert x4 on room air able to make all needs known call light in reach ask for AMBIEN to be ordered home medication  2000 all meds given as ordered

## 2022-11-24 NOTE — Progress Notes (Signed)
Cardiology Clinic Note   Patient Name: 77 [facility] Date of Encounter: 12/01/2022  Primary Care Provider:  Corwin Levins, MD Primary Cardiologist:  Reatha Harps, MD  Patient Profile    Elihu Milstein 77 male presents to the clinic today for follow-up evaluation of his essential hypertension, coronary artery disease, and mild aortic stenosis.  Past Medical History    Past Medical History:  Diagnosis Date   Allergy    seasonal   Anginal pain (HCC)    Anxiety    CHF (congestive heart failure) (HCC)    Coronary artery disease    Depression    Diabetes mellitus without complication (HCC)    Type 2   GERD (gastroesophageal reflux disease)    Pt states "take ambien for this"   Hepatitis C    HLD (hyperlipidemia) 08/09/2017   Hypertension    Hypoglycemia    Pneumonia 06/2022   Stroke (HCC) 03/2021   TIA   Past Surgical History:  Procedure Laterality Date   APPENDECTOMY     BUBBLE STUDY  06/16/2021   Procedure: BUBBLE STUDY;  Surgeon: Sande Rives, MD;  Location: [facility] ENDOSCOPY;  Service: Cardiovascular;;   COLONOSCOPY     CORONARY ARTERY BYPASS GRAFT N/A 11/10/2022   Procedure: CORONARY ARTERY BYPASS GRAFTING (CABG) X5 USING LEFT INTERNAL MAMMARY ARTERY (LIMA) AND ENDOSCOPICALLY HARVESTED RIGHT AND LEFT GREATER SAPHENOUS VEIN;  Surgeon: Eugenio Hoes, MD;  Location: [facility] OR;  Service: Open Heart Surgery;  Laterality: N/A;   FRACTURE SURGERY Left    Ankle   LEFT HEART CATH AND CORONARY ANGIOGRAPHY N/A 07/12/2022   Procedure: LEFT HEART CATH AND CORONARY ANGIOGRAPHY;  Surgeon: Marykay Lex, MD;  Location: [facility] INVASIVE CV LAB;  Service: Cardiovascular;  Laterality: N/A;   TEE WITHOUT CARDIOVERSION N/A 06/16/2021   Procedure: TRANSESOPHAGEAL ECHOCARDIOGRAM (TEE);  Surgeon: Sande Rives, MD;  Location: [facility] ENDOSCOPY;  Service: Cardiovascular;  Laterality: N/A;   TEE WITHOUT CARDIOVERSION N/A 11/10/2022   Procedure: TRANSESOPHAGEAL  ECHOCARDIOGRAM;  Surgeon: Eugenio Hoes, MD;  Location: [facility] OR;  Service: Open Heart Surgery;  Laterality: N/A;   TONSILECTOMY, ADENOIDECTOMY, BILATERAL MYRINGOTOMY AND TUBES  1955    Allergies  No Known Allergies  History of Present Illness    Gabe Glace has a PMH of essential hypertension, mild aortic stenosis, CVA, chronic diastolic CHF, NSTEMI, aortic atherosclerosis, pericardial effusion, community-acquired pneumonia, allergic rhinitis, type 2 diabetes, CKD stage IIIa, anxiety, insomnia, and hyperlipidemia.  He was noted to have multivessel coronary artery disease status post NSTEMI 2/24.  He was evaluated by cardiology 1/23.  He was noted to have a possible mitral valve mass that was identified during echocardiogram.  He underwent TEE which showed calcified mitral valve tissue and no further evaluation was recommended.  He was hospitalized 2/24 with severe sepsis secondary to community-acquired pneumonia and NSTEMI.  His echocardiogram showed EF of 60-65%, hypokinesis of basal inferior segment, G1 DD, mild aortic valve stenosis.  His coronary CTA showed multivessel CAD and his FFR was positive with mid LAD, mid-distal left circumflex, proximal RCA stenosis.  He underwent LHC and was noted to have multivessel CAD in his LAD, D1, circumflex, OM1.  His anatomy was not favorable for PCI.  CT surgery was consulted.  He underwent MRI/A of the head during admission which showed scattered bilateral subcortical strokes.  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  He was followed by neurology.  He was seen in the clinic 07/25/2022 and was felt to be stable from a cardiac standpoint.  He reported some intermittent chest pain, DOE.  He was started on Imdur.  He followed up with CT surgery who felt that optimizing medical therapy for CAD would be his best course of treatment.  It was felt that if he failed medical  management Which could be reconsidered.  He was also initiated on Repatha.  He was last seen by Bernadene Person, NP-C on 10/28/2022.  He was accompanied with his girlfriend.  He continues to be stable from a cardiac standpoint.  He did note some increased frequency of intermittent chest pain and dyspnea on exertion.  He reported taking Imdur but was also taking nitroglycerin regularly.  He was looking forward to following up with CT surgery to discuss possibility of surgical intervention.  He underwent CABG x 5 10/21/2022.  He was discharged on 11/21/2022.  He was extubated postop day 1.  His arterial line, chest tubes, and Foley catheter were removed without issue.  He did require postoperative transfusion.  He was noted to have thrombocytopenia and platelet count decreased to 56,000. He received IV diuresis and albumin.  He was noted to have atrial fibrillation with RVR and was started on amiodarone gtt.  He converted to sinus rhythm and was transition to p.o. amiodarone.  He progressed with his physical activity but was noted to have severe deconditioning.  His hemoglobin improved.  He was noted to be hypertensive.  His metoprolol and hydralazine were resumed.  He was transition to p.o. furosemide.  His incisions continue to heal well without signs of infection.  He was felt to be stable for discharge to SNF.  He was admitted from the emergency department on 11/24/2022 and discharged on 11/27/2022.  On arrival he was afebrile and hemodynamically stable.  His chest x-ray showed small left pleural effusion.  CT showed moderate size pericardial effusion with bilateral pleural effusions and cirrhosis of the liver.  He was admitted under the hospitalist service.  He reported 2 days of hemoptysis.  He denied shortness of breath.  His CTA was negative for PE.  He was seen in follow-up by CT surgery and was noted to be stable from a cardiac standpoint.  At discharge she denied chest pain.  His blood pressure was  well-controlled.  He presents to the clinic today for follow-up evaluation and states he well.  He continues to progress through rehab.  He presents with his wife today.  We reviewed his CABG.  They expressed understanding.  He has been maintaining sternal precautions.  His blood pressure today is 98/58.  He denies further episodes of bleeding.  He denies irregular heartbeats.  I will continue his current medical therapy.  Will plan follow-up in 4 to 6 months.  I will also remove his chest tube sutures today.  We reviewed his upcoming visit with his PCP and cardiothoracic surgery on Tuesday.  Today he denies chest pain, shortness of breath, lower extremity edema, fatigue, palpitations, melena, hematuria, hemoptysis, diaphoresis, weakness, presyncope, syncope, orthopnea, and PND.    Home Medications    Prior to Admission medications   Medication Sig Start Date End Date Taking? Authorizing Provider  amiodarone (PACERONE) 200 MG tablet Take 1 tablet (200 mg total) by mouth 2 (two) times daily. X 7 days, then decrease to 200 mg daily 11/19/22   Barrett, Rae Roam, PA-C  aspirin EC 325 MG tablet Take  1 tablet (325 mg total) by mouth daily. 11/19/22   Barrett, Erin R, PA-C  atorvastatin (LIPITOR) 80 MG tablet Take 1 tablet (80 mg total) by mouth daily. 07/14/22 11/22/22  Narda Bonds, MD  cholecalciferol (VITAMIN D3) 25 MCG (1000 UNIT) tablet Take 1 tablet (1,000 Units total) by mouth daily. Take 1,000 Units by mouth daily. 11/22/21   Corwin Levins, MD  Evolocumab (REPATHA SURECLICK) 140 MG/ML SOAJ Inject 140 mg into the skin every 14 days. 10/06/22   O'NealRonnald Ramp, MD  fenofibrate 160 MG tablet Take 1 tablet (160 mg total) by mouth daily. 11/09/22   Corwin Levins, MD  fluticasone (FLONASE) 50 MCG/ACT nasal spray Place 2 sprays into both nostrils daily. 11/09/22   Corwin Levins, MD  gabapentin (NEURONTIN) 300 MG capsule Take 1 capsule (300 mg total) by mouth at bedtime. 08/08/22   Plotnikov, Georgina Quint, MD   hydrALAZINE (APRESOLINE) 25 MG tablet Take 1 tablet (25 mg total) by mouth every 8 (eight) hours. 10/11/22 01/09/23  Corwin Levins, MD  losartan (COZAAR) 100 MG tablet Take 1 tablet (100 mg total) by mouth daily. 11/09/22   Corwin Levins, MD  magnesium oxide (MAG-OX) 400 (240 Mg) MG tablet Take 400 mg by mouth daily.    [provider]  metFORMIN (GLUCOPHAGE-XR) 500 MG 24 hr tablet Take 1 tablet (500 mg) by mouth daily with breakfast. 11/09/22   Corwin Levins, MD  metoprolol tartrate (LOPRESSOR) 25 MG tablet Take 1 tablet (25 mg total) by mouth 2 (two) times daily. 11/19/22 03/19/23  Barrett, Erin R, PA-C  nitroGLYCERIN (NITROSTAT) 0.4 MG SL tablet Place 1 tablet under the tongue every 5 minutes as needed for chest pain. 10/06/22 01/04/23  Sande Rives, MD  PARoxetine (PAXIL) 40 MG tablet Take 1 tablet (40 mg total) by mouth every morning. 11/09/22   Corwin Levins, MD  vitamin E 180 MG (400 UNITS) capsule Take 400 Units by mouth daily.    [provider]  zolpidem (AMBIEN) 10 MG tablet TAKE 1 TABLET(10 MG) BY MOUTH AT BEDTIME AS NEEDED FOR SLEEP Patient taking differently: Take 5-10 mg by mouth at bedtime. 09/22/22   Corwin Levins, MD    Family History    Family History  Problem Relation Age of Onset   Lung disease Mother    Hyperlipidemia Father    Hypertension Father    He indicated that his mother is deceased. He indicated that his father is deceased. He indicated that his son is alive.  Social History    Social History   Socioeconomic History   Marital status: Single    Spouse name: Not on file   Number of children: 0   Years of education: some college   Highest education level: Associate degree: occupational, Scientist, product/process development, or vocational program  Occupational History   Occupation: retired  Tobacco Use   Smoking status: Former   Smokeless tobacco: Never   Tobacco comments:    quit 71yrs ago, smoke a cigar occassionally.  Vaping Use   Vaping status: Never Used   Substance and Sexual Activity   Alcohol use: Not Currently    Comment: Quit 30 years ago   Drug use: No   Sexual activity: Yes  Other Topics Concern   Not on file  Social History Narrative   Lives alone.   Right-handed.   Two cups caffeine per day.   Social Determinants of Health   Financial Resource Strain: Low Risk  (  09/29/2022)   Overall Financial Resource Strain (CARDIA)    Difficulty of Paying Living Expenses: Not hard at all  Food Insecurity: No Food Insecurity (11/24/2022)   Hunger Vital Sign    Worried About Running Out of Food in the Last Year: Never true    Ran Out of Food in the Last Year: Never true  Transportation Needs: No Transportation Needs (11/24/2022)   PRAPARE - Administrator, Civil Service (Medical): No    Lack of Transportation (Non-Medical): No  Physical Activity: Insufficiently Active (09/29/2022)   Exercise Vital Sign    Days of Exercise per Week: 1 day    Minutes of Exercise per Session: 10 min  Stress: No Stress Concern Present (09/29/2022)   [employer] of Occupational Health - Occupational Stress Questionnaire    Feeling of Stress : Not at all  Social Connections: Socially Isolated (09/29/2022)   Social Connection and Isolation Panel [NHANES]    Frequency of Communication with Friends and Family: More than three times a week    Frequency of Social Gatherings with Friends and Family: Once a week    Attends Religious Services: Never    Database administrator or Organizations: No    Attends Engineer, structural: Not on file    Marital Status: Divorced  Intimate Partner Violence: Not At Risk (11/24/2022)   Humiliation, Afraid, Rape, and Kick questionnaire    Fear of Current or Ex-Partner: No    Emotionally Abused: No    Physically Abused: No    Sexually Abused: No     Review of Systems    General:  No chills, fever, night sweats or weight changes.  Cardiovascular:  No chest pain, dyspnea on exertion, edema, orthopnea,  palpitations, paroxysmal nocturnal dyspnea. Dermatological: No rash, lesions/masses Respiratory: No cough, dyspnea Urologic: No hematuria, dysuria Abdominal:   No nausea, vomiting, diarrhea, bright red blood per rectum, melena, or hematemesis Neurologic:  No visual changes, wkns, changes in mental status. All other systems reviewed and are otherwise negative except as noted above.  Physical Exam    VS:  BP (!) 98/58 (BP Location: Left Arm, Patient Position: Sitting, Cuff Size: Normal)   Pulse 67   Ht 6' (1.829 m)   Wt 166 lb 6.4 oz (75.5 kg)   SpO2 97%   BMI 22.57 kg/m  , BMI Body mass index is 22.57 kg/m. GEN: Well nourished, well developed, in no acute distress. HEENT: normal. Neck: Supple, no JVD, carotid bruits, or masses. Cardiac: RRR, no murmurs, rubs, or gallops. No clubbing, cyanosis, edema.  Radials/DP/PT 2+ and equal bilaterally.  Respiratory:  Respirations regular and unlabored, clear to auscultation bilaterally. GI: Soft, nontender, nondistended, BS + x 4. MS: no deformity or atrophy. Skin: warm and dry, no rash.  Sternal incision healing well no signs of infection.  Chest tube sites healing well no signs of infection. Neuro:  Strength and sensation are intact. Psych: Normal affect.  Accessory Clinical Findings    Recent Labs: 06/03/2022: TSH 0.98 11/11/2022: Magnesium 2.5 11/22/2022: ALT 27 11/24/2022: B Natriuretic Peptide 329.6 11/25/2022: BUN 15; Creatinine, Ser 1.30; Hemoglobin 11.1; Platelets 284; Potassium 3.7; Sodium 137   Recent Lipid Panel    Component Value Date/Time   CHOL 88 (L) 10/31/2022 0826   TRIG 122 10/31/2022 0826   HDL 49 10/31/2022 0826   CHOLHDL 1.8 10/31/2022 0826   CHOLHDL 4.5 07/10/2022 0742   VLDL 26 07/10/2022 0742   LDLCALC 17 10/31/2022 0826   LDLDIRECT  146.0 06/03/2022 0849         ECG personally reviewed by me today- EKG Interpretation Date/Time:  Thursday December 01 2022 10:18:24 EDT Ventricular Rate:  67 PR  Interval:  172 QRS Duration:  138 QT Interval:  460 QTC Calculation: 486 R Axis:   -49  Text Interpretation: Normal sinus rhythm Left axis deviation Right bundle branch block Septal infarct , age undetermined T wave abnormality, consider lateral ischemia Confirmed by [name] [phone]) on 12/01/2022 10:21:00 AM    Echocardiogram 11/24/2022  IMPRESSIONS     1. Left ventricular ejection fraction, by estimation, is 60 to 65%. The  left ventricle has normal function. The left ventricle has no regional  wall motion abnormalities. Diastolic function indeterminant due to  significant MAC.   2. Right ventricular systolic function is normal. The right ventricular  size is normal. There is normal pulmonary artery systolic pressure. The  estimated right ventricular systolic pressure is 15.1 mmHg.   3. A small pericardial effusion is present. There is no evidence of  cardiac tamponade. Moderate pleural effusion in the left lateral region.   4. The mitral valve is degenerative. Trivial mitral valve regurgitation.  Moderate mitral annular calcification.   5. The aortic valve is tricuspid. There is moderate calcification of the  aortic valve. There is moderate thickening of the aortic valve. Aortic  valve regurgitation is not visualized. Mild aortic valve stenosis. Aortic  valve mean gradient measures 10.4  mmHg. Aortic valve Vmax measures 2.40 m/s.   6. Aortic dilatation noted. There is borderline dilatation of the  ascending aorta, measuring 38 mm.   7. The inferior vena cava is normal in size with greater than 50%  respiratory variability, suggesting right atrial pressure of 3 mmHg.   Comparison(s): No significant change from prior study.   FINDINGS   Left Ventricle: Left ventricular ejection fraction, by estimation, is 60  to 65%. The left ventricle has normal function. The left ventricle has no  regional wall motion abnormalities. The left ventricular internal cavity  size was normal  in size. There is   no left ventricular hypertrophy. Diastolic function indeterminant due to  significant MAC.   Right Ventricle: The right ventricular size is normal. No increase in  right ventricular wall thickness. Right ventricular systolic function is  normal. There is normal pulmonary artery systolic pressure. The tricuspid  regurgitant velocity is 1.74 m/s, and   with an assumed right atrial pressure of 3 mmHg, the estimated right  ventricular systolic pressure is 15.1 mmHg.   Left Atrium: Left atrial size was normal in size.   Right Atrium: Right atrial size was normal in size.   Pericardium: A small pericardial effusion is present. There is no evidence  of cardiac tamponade.   Mitral Valve: The mitral valve is degenerative in appearance. There is  moderate thickening of the mitral valve leaflet(s). There is moderate  calcification of the mitral valve leaflet(s). Moderate mitral annular  calcification. Trivial mitral valve  regurgitation.   Tricuspid Valve: The tricuspid valve is normal in structure. Tricuspid  valve regurgitation is trivial.   Aortic Valve: The aortic valve is tricuspid. There is moderate  calcification of the aortic valve. There is moderate thickening of the  aortic valve. Aortic valve regurgitation is not visualized. Mild aortic  stenosis is present. Aortic valve mean gradient  measures 10.4 mmHg. Aortic valve peak gradient measures 23.0 mmHg. Aortic  valve area, by VTI measures 1.08 cm.   Pulmonic Valve: The pulmonic  valve was grossly normal. Pulmonic valve  regurgitation is trivial.   Aorta: Aortic dilatation noted. There is borderline dilatation of the  ascending aorta, measuring 38 mm.   Venous: The inferior vena cava is normal in size with greater than 50%  respiratory variability, suggesting right atrial pressure of 3 mmHg.   IAS/Shunts: The atrial septum is grossly normal.   Additional Comments: There is a moderate pleural effusion in  the left  lateral region.       Assessment & Plan   1.  Coronary artery disease-no chest pain today.  Underwent cardiac catheterization in the setting of NSTEMI and sepsis 2/24.  He underwent cardiac catheterization and was not felt to be a candidate for PCI.  CT surgery consulted and felt that once he recovered from sepsis infection and if he failed medical management Which could be reconsidered.  Status post CABG x 5 on 11/10/2022.  Presented to the emergency department on 11/24/2022 and was discharged on 11/27/2022.  He was noted to have pericardial effusion.  CT surgery consulted and felt he remained stable from a cardiac standpoint. Will remove chest tube sutures today.  Atrial fibrillation-EKG today shows Normal sinus rhythm Left axis deviation Right bundle branch block Septal infarct , undetermined age 60 bpm .  Noted atrial fibrillation postoperatively.  Converted to normal sinus rhythm on gtt. amiodarone. Continue p.o. amiodarone, metoprolol Avoid triggers caffeine, chocolate, EtOH, dehydration etc.  Hyperlipidemia-LDL 17 on 10/31/22. High-fiber diet Continue atorvastatin, fenofibrate, Repatha    Pericardial effusion-denies increased heart rate, DOE or activity intolerance.  Continues to progress physical activity.   Maintain sternal precautions. Continue current medical therapy No plans for repeat echo at this time.  Essential hypertension-BP today 98/58. Maintain blood pressure log Continue current medical therapy  Type 2 diabetes-glucose 114 on 11/25/22. Follows with PCP  Disposition: Follow-up with Dr. Flora Lipps or me in 4 to 6 months.   Thomasene Ripple. Dashayla Theissen NP-C     12/01/2022, 10:21 AM [facility] [address] Office [phone] Fax [phone]    I spent 14 minutes examining this patient, reviewing medications, and using patient centered shared decision making involving her cardiac care.  Prior to her visit I spent greater  than 20 minutes reviewing her past medical history,  medications, and prior cardiac tests.

## 2022-11-24 NOTE — Progress Notes (Signed)
[facility] night cross cover note:   I was notified by RN of the patient's request for sleep aid.  The patient conveys that melatonin historically has been ineffective for him as a sleep aid.  Rather, he reports that he has been taking Ambien 10 mg p.o. nightly for years and has never experienced any adverse side effects of his sleep aid, requesting its resumption at the present time.  I subsequently placed order for Ambien 10 mg p.o. nightly as needed for insomnia.    Newton Pigg, DO Hospitalist

## 2022-11-24 NOTE — Evaluation (Signed)
Occupational Therapy Evaluation Patient Details Name: Collin Bridges MRN: [id number] DOB: [birth year] Today's Date: 11/24/2022   History of Present Illness 77 y.o. male with medical history significant of CAD status post recent CABG, CHF, type 2 diabetes, GERD, hypertension, prior stroke who presented to the emergency department due to 2 days of hemoptysis.   Clinical Impression   Patient admitted from SNF for the above diagnosis.  Patient was undergoing short term rehab post CABG.  Patient continues to need cues for sternal precautions, Mod A for initial sit to stand due to heavy posterior lean, and up to Mod A for lower body ADL due to poor dynamic balance.  Patient will be followed by OT to address deficits, and Patient will benefit from continued inpatient follow up therapy, <3 hours/day.       Recommendations for follow up therapy are one component of a multi-disciplinary discharge planning process, led by the attending physician.  Recommendations may be updated based on patient status, additional functional criteria and insurance authorization.   Assistance Recommended at Discharge Frequent or constant Supervision/Assistance  Patient can return home with the following A lot of help with walking and/or transfers;A lot of help with bathing/dressing/bathroom;Assistance with cooking/housework;Direct supervision/assist for medications management;Direct supervision/assist for financial management;Assist for transportation;Help with stairs or ramp for entrance    Functional Status Assessment  Patient has had a recent decline in their functional status and demonstrates the ability to make significant improvements in function in a reasonable and predictable amount of time.  Equipment Recommendations       Recommendations for Other Services       Precautions / Restrictions Precautions Precautions: Sternal;Fall Restrictions Weight Bearing Restrictions: Yes Other Position/Activity  Restrictions: sternal precautions      Mobility Bed Mobility Overal bed mobility: Needs Assistance Bed Mobility: Supine to Sit, Sit to Supine   Sidelying to sit: Min guard Supine to sit: Min guard          Transfers Overall transfer level: Needs assistance Equipment used: Rolling walker (2 wheels) Transfers: Sit to/from Stand Sit to Stand: Mod assist                  Balance Overall balance assessment: Needs assistance Sitting-balance support: No upper extremity supported, Feet supported Sitting balance-Leahy Scale: Fair     Standing balance support: Reliant on assistive device for balance Standing balance-Leahy Scale: Poor                             ADL either performed or assessed with clinical judgement   ADL Overall ADL's : Needs assistance/impaired Eating/Feeding: Set up;Sitting   Grooming: Oral care;Min guard;Standing   Upper Body Bathing: Sitting;Minimal assistance;Cueing for UE precautions   Lower Body Bathing: Moderate assistance;Minimal assistance;Sit to/from stand   Upper Body Dressing : Sitting;Minimal assistance;Cueing for UE precautions   Lower Body Dressing: Moderate assistance;Minimal assistance;Sit to/from stand   Toilet Transfer: Minimal assistance;Rolling walker (2 wheels);Ambulation                   Vision Patient Visual Report: No change from baseline       Perception     Praxis      Pertinent Vitals/Pain Pain Assessment Pain Assessment: Faces Faces Pain Scale: Hurts a little bit Pain Location: chest/incision Pain Descriptors / Indicators: Sore Pain Intervention(s): Monitored during session     Hand Dominance Right   Extremity/Trunk Assessment Upper Extremity Assessment Upper Extremity Assessment:  Overall [facility] for tasks assessed   Lower Extremity Assessment Lower Extremity Assessment: Defer to PT evaluation   Cervical / Trunk Assessment Cervical / Trunk Assessment: Normal   Communication  Communication Communication: No difficulties   Cognition Arousal/Alertness: Awake/alert Behavior During Therapy: Flat affect Overall Cognitive Status: No family/caregiver present to determine baseline cognitive functioning Area of Impairment: Memory, Problem solving                     Memory: Decreased recall of precautions       Problem Solving: Slow processing, Requires verbal cues                        Home Living Family/patient expects to be discharged to:: Skilled nursing facility Living Arrangements: Alone Available Help at Discharge: Friend(s);Available PRN/intermittently Type of Home: House Home Access: Stairs to enter [employer] of Steps: 5 Entrance Stairs-Rails: Right;Left Home Layout: One level     Bathroom Shower/Tub: Chief Strategy Officer: Standard     Home Equipment: Grab bars - tub/shower          Prior Functioning/Environment Prior Level of Function : Independent/Modified Independent             Mobility Comments: uses walking stick ADLs Comments: does not drive, ind with ADLs/IADLs        OT Problem List: Decreased strength;Decreased range of motion;Decreased activity tolerance;Impaired balance (sitting and/or standing);Decreased cognition;Decreased safety awareness;Decreased knowledge of precautions;Cardiopulmonary status limiting activity      OT Treatment/Interventions: Self-care/ADL training;Therapeutic exercise;Energy conservation;DME and/or AE instruction;Therapeutic activities;Patient/family education;Balance training    OT Goals(Current goals can be found in the care plan section) Acute Rehab OT Goals Patient Stated Goal: Return home OT Goal Formulation: With patient Time For Goal Achievement: 12/08/22 Potential to Achieve Goals: Good ADL Goals Pt Will Perform Grooming: with modified independence;standing Pt Will Perform Lower Body Dressing: with supervision;sit to/from stand Pt Will  Transfer to Toilet: with supervision;ambulating;regular height toilet  OT Frequency: Min 1X/week    Co-evaluation              AM-PAC OT "6 Clicks" Daily Activity     Outcome Measure Help from another person eating meals?: None Help from another person taking care of personal grooming?: A Little Help from another person toileting, which includes using toliet, bedpan, or urinal?: A Little Help from another person bathing (including washing, rinsing, drying)?: A Lot Help from another person to put on and taking off regular upper body clothing?: A Little Help from another person to put on and taking off regular lower body clothing?: A Lot 6 Click Score: 17   End of Session Equipment Utilized During Treatment: Rolling walker (2 wheels) Nurse Communication: Mobility status  Activity Tolerance: Patient tolerated treatment well Patient left: in bed;with call bell/phone within reach;with bed alarm set  OT Visit Diagnosis: Muscle weakness (generalized) (M62.81);Other abnormalities of gait and mobility (R26.89)                Time: 7829-5621 OT Time Calculation (min): 21 min Charges:  OT General Charges $OT Visit: 1 Visit OT Evaluation $OT Eval Moderate Complexity: 1 Mod  11/24/2022  RP, OTR/L  Acute Rehabilitation Services  Office:  [phone]   Suzanna Obey 11/24/2022, 5:28 PM

## 2022-11-24 NOTE — ED Notes (Signed)
ED TO INPATIENT HANDOFF REPORT  ED Nurse Name and Phone #: Vernona Rieger [phone]  F Name/Age/Gender Collin Bridges 77 y.o. male Room/Bed: 045C/045C  Code Status   Code Status: Full Code  Home/SNF/Other Home Patient oriented to: self, place, time, and situation Is this baseline? Yes   Triage Complete: Triage complete  Chief Complaint Pericardial effusion [I31.39]  Triage Note Pt stated he's been coughing up blood since he had his cabbage. Pt was seen a day ago for the same thing.    Allergies No Known Allergies  Level of Care/Admitting Diagnosis ED Disposition     ED Disposition  Admit   Condition  --   Comment  Hospital Area: [facility] [[id number]]  Level of Care: Progressive [102]  Admit to Progressive based on following criteria: Other see comments  Comments: G  May admit patient to [facility] or [facility] if equivalent level of care is available:: Yes  Covid Evaluation: Asymptomatic - no recent exposure (last 10 days) testing not required  Diagnosis: Pericardial effusion [203045]  Admitting Physician: Alan Mulder [[id number]]  Attending Physician: Alan Mulder [[id number]]  Certification:: I certify this patient will need inpatient services for at least 2 midnights  Estimated Length of Stay: 2          B Medical/Surgery History Past Medical History:  Diagnosis Date   Allergy    seasonal   Anginal pain (HCC)    Anxiety    CHF (congestive heart failure) (HCC)    Coronary artery disease    Depression    Diabetes mellitus without complication (HCC)    Type 2   GERD (gastroesophageal reflux disease)    Pt states "take ambien for this"   Hepatitis C    HLD (hyperlipidemia) 08/09/2017   Hypertension    Hypoglycemia    Pneumonia 06/2022   Stroke (HCC) 03/2021   TIA   Past Surgical History:  Procedure Laterality Date   APPENDECTOMY     BUBBLE STUDY  06/16/2021   Procedure: BUBBLE STUDY;  Surgeon: Sande Rives, MD;   Location: [facility] ENDOSCOPY;  Service: Cardiovascular;;   COLONOSCOPY     CORONARY ARTERY BYPASS GRAFT N/A 11/10/2022   Procedure: CORONARY ARTERY BYPASS GRAFTING (CABG) X5 USING LEFT INTERNAL MAMMARY ARTERY (LIMA) AND ENDOSCOPICALLY HARVESTED RIGHT AND LEFT GREATER SAPHENOUS VEIN;  Surgeon: Eugenio Hoes, MD;  Location: [facility] OR;  Service: Open Heart Surgery;  Laterality: N/A;   FRACTURE SURGERY Left    Ankle   LEFT HEART CATH AND CORONARY ANGIOGRAPHY N/A 07/12/2022   Procedure: LEFT HEART CATH AND CORONARY ANGIOGRAPHY;  Surgeon: Marykay Lex, MD;  Location: [facility] INVASIVE CV LAB;  Service: Cardiovascular;  Laterality: N/A;   TEE WITHOUT CARDIOVERSION N/A 06/16/2021   Procedure: TRANSESOPHAGEAL ECHOCARDIOGRAM (TEE);  Surgeon: Sande Rives, MD;  Location: [facility] ENDOSCOPY;  Service: Cardiovascular;  Laterality: N/A;   TEE WITHOUT CARDIOVERSION N/A 11/10/2022   Procedure: TRANSESOPHAGEAL ECHOCARDIOGRAM;  Surgeon: Eugenio Hoes, MD;  Location: [facility] OR;  Service: Open Heart Surgery;  Laterality: N/A;   TONSILECTOMY, ADENOIDECTOMY, BILATERAL MYRINGOTOMY AND TUBES  1955     A IV Location/Drains/Wounds Patient Lines/Drains/Airways Status     Active Line/Drains/Airways     Name Placement date Placement time Site Days   Peripheral IV 11/23/22 22 G 2.5" Anterior;Right Forearm 11/23/22  2140  Forearm  1   Pressure Injury 11/13/22 Coccyx Medial Stage 1 -  Intact skin with non-blanchable redness of a localized area usually over a bony prominence.  11/13/22  1600  -- 11            Intake/Output Last 24 hours  Intake/Output Summary (Last 24 hours) at 11/24/2022 1100 Last data filed at 11/24/2022 0758 Gross per 24 hour  Intake --  Output 200 ml  Net -200 ml    Labs/Imaging Results for orders placed or performed during the hospital encounter of 11/23/22 (from the past 48 hour(s))  CBC with Differential     Status: Abnormal   Collection Time: 11/23/22  6:29 PM  Result Value Ref Range   WBC 10.5  4.0 - 10.5 K/uL   RBC 3.66 (L) 4.22 - 5.81 MIL/uL   Hemoglobin 10.8 (L) 13.0 - 17.0 g/dL   HCT 16.1 (L) 09.6 - 04.5 %   MCV 89.9 80.0 - 100.0 fL   MCH 29.5 26.0 - 34.0 pg   MCHC 32.8 30.0 - 36.0 g/dL   RDW 40.9 (H) 81.1 - 91.4 %   Platelets 305 150 - 400 K/uL   nRBC 0.0 0.0 - 0.2 %   Neutrophils Relative % 74 %   Neutro Abs 7.7 1.7 - 7.7 K/uL   Lymphocytes Relative 12 %   Lymphs Abs 1.3 0.7 - 4.0 K/uL   Monocytes Relative 8 %   Monocytes Absolute 0.8 0.1 - 1.0 K/uL   Eosinophils Relative 4 %   Eosinophils Absolute 0.5 0.0 - 0.5 K/uL   Basophils Relative 1 %   Basophils Absolute 0.1 0.0 - 0.1 K/uL   Immature Granulocytes 1 %   Abs Immature Granulocytes 0.06 0.00 - 0.07 K/uL    Comment: Performed at [facility], [address], [place], [place] [address]  Basic metabolic panel     Status: Abnormal   Collection Time: 11/23/22  6:29 PM  Result Value Ref Range   Sodium 137 135 - 145 mmol/L   Potassium 4.0 3.5 - 5.1 mmol/L   Chloride 100 98 - 111 mmol/L   CO2 25 22 - 32 mmol/L   Glucose, Bld 112 (H) 70 - 99 mg/dL    Comment: Glucose reference range applies only to samples taken after fasting for at least 8 hours.   BUN 13 8 - 23 mg/dL   Creatinine, Ser 6.21 (H) 0.61 - 1.24 mg/dL   Calcium 9.2 8.9 - 30.8 mg/dL   GFR, Estimated 59 (L) >60 mL/min    Comment: (NOTE) Calculated using the CKD-EPI Creatinine Equation (2021)    Anion gap 12 5 - 15    Comment: Performed at [facility], [address] [place], [place] [address]  Troponin I (High Sensitivity)     Status: Abnormal   Collection Time: 11/23/22  6:29 PM  Result Value Ref Range   Troponin I (High Sensitivity) 19 (H) <18 ng/L    Comment: (NOTE) Elevated high sensitivity troponin I (hsTnI) values and significant  changes across serial measurements may suggest ACS but many other  chronic and acute conditions are known to elevate hsTnI results.  Refer to the "Links" section for chest pain algorithms and  additional  guidance. Performed at [facility], [address] [place], [place] [address]   Brain natriuretic peptide     Status: Abnormal   Collection Time: 11/23/22  6:29 PM  Result Value Ref Range   B Natriuretic Peptide 369.3 (H) 0.0 - 100.0 pg/mL    Comment: Performed at [facility], [address] [place], [place] [address]  CBC  Status: Abnormal   Collection Time: 11/23/22 10:14 PM  Result Value Ref Range   WBC 8.4 4.0 - 10.5 K/uL   RBC 3.57 (L) 4.22 - 5.81 MIL/uL   Hemoglobin 10.6 (L) 13.0 - 17.0 g/dL   HCT 45.4 (L) 09.8 - 11.9 %   MCV 89.6 80.0 - 100.0 fL   MCH 29.7 26.0 - 34.0 pg   MCHC 33.1 30.0 - 36.0 g/dL   RDW 14.7 (H) 82.9 - 56.2 %   Platelets 287 150 - 400 K/uL   nRBC 0.0 0.0 - 0.2 %    Comment: Performed at [facility], [address], [place], [place] [address]  Creatinine, serum     Status: Abnormal   Collection Time: 11/23/22 10:14 PM  Result Value Ref Range   Creatinine, Ser 1.24 0.61 - 1.24 mg/dL   GFR, Estimated 60 (L) >60 mL/min    Comment: (NOTE) Calculated using the CKD-EPI Creatinine Equation (2021) Performed at [facility], [address] [place], [place] [address]   Troponin I (High Sensitivity)     Status: Abnormal   Collection Time: 11/23/22 10:14 PM  Result Value Ref Range   Troponin I (High Sensitivity) 19 (H) <18 ng/L    Comment: (NOTE) Elevated high sensitivity troponin I (hsTnI) values and significant  changes across serial measurements may suggest ACS but many other  chronic and acute conditions are known to elevate hsTnI results.  Refer to the "Links" section for chest pain algorithms and additional  guidance. Performed at [facility], [address] [place], [place] [address]   CBC     Status: Abnormal   Collection Time: 11/24/22  4:45 AM  Result Value Ref Range   WBC 8.6 4.0 - 10.5 K/uL   RBC 3.66 (L) 4.22 - 5.81 MIL/uL   Hemoglobin 11.0 (L) 13.0 - 17.0 g/dL   HCT 28.4 (L) 13.2 -  52.0 %   MCV 90.7 80.0 - 100.0 fL   MCH 30.1 26.0 - 34.0 pg   MCHC 33.1 30.0 - 36.0 g/dL   RDW 44.0 (H) 10.2 - 72.5 %   Platelets 289 150 - 400 K/uL   nRBC 0.0 0.0 - 0.2 %    Comment: Performed at [facility], [address], [place], [place] [address]   ECHOCARDIOGRAM COMPLETE  Result Date: 11/24/2022    ECHOCARDIOGRAM REPORT   Patient Name:   Collin Bridges Date of Exam: 11/24/2022 Medical Rec #:  [id number]             Height:       72.0 in Accession #:    [id number]            Weight:       170.0 lb Date of Birth:  Sep 30, 1945            BSA:          1.988 m Patient Age:    76 years              BP:           137/73 mmHg Patient Gender: M                     HR:           71 bpm. Exam Location:  Inpatient Procedure: 2D Echo, Cardiac Doppler and Color Doppler Indications:    Pericardial effusion I31.3  History:  Patient has prior history of Echocardiogram examinations, most                 recent 07/09/2022. CHF, Previous Myocardial Infarction and CAD,                 Prior CABG, Stroke, Signs/Symptoms:Chest Pain; Risk                 Factors:Hypertension, Diabetes and Dyslipidemia. CKD, stage 3.  Sonographer:    Lucendia Herrlich Referring Phys: [id number] ROBERT DORRELL IMPRESSIONS  1. Left ventricular ejection fraction, by estimation, is 60 to 65%. The left ventricle has normal function. The left ventricle has no regional wall motion abnormalities. Diastolic function indeterminant due to significant MAC.  2. Right ventricular systolic function is normal. The right ventricular size is normal. There is normal pulmonary artery systolic pressure. The estimated right ventricular systolic pressure is 15.1 mmHg.  3. A small pericardial effusion is present. There is no evidence of cardiac tamponade. Moderate pleural effusion in the left lateral region.  4. The mitral valve is degenerative. Trivial mitral valve regurgitation. Moderate mitral annular calcification.  5. The aortic valve is  tricuspid. There is moderate calcification of the aortic valve. There is moderate thickening of the aortic valve. Aortic valve regurgitation is not visualized. Mild aortic valve stenosis. Aortic valve mean gradient measures 10.4 mmHg. Aortic valve Vmax measures 2.40 m/s.  6. Aortic dilatation noted. There is borderline dilatation of the ascending aorta, measuring 38 mm.  7. The inferior vena cava is normal in size with greater than 50% respiratory variability, suggesting right atrial pressure of 3 mmHg. Comparison(s): No significant change from prior study. FINDINGS  Left Ventricle: Left ventricular ejection fraction, by estimation, is 60 to 65%. The left ventricle has normal function. The left ventricle has no regional wall motion abnormalities. The left ventricular internal cavity size was normal in size. There is  no left ventricular hypertrophy. Diastolic function indeterminant due to significant MAC. Right Ventricle: The right ventricular size is normal. No increase in right ventricular wall thickness. Right ventricular systolic function is normal. There is normal pulmonary artery systolic pressure. The tricuspid regurgitant velocity is 1.74 m/s, and  with an assumed right atrial pressure of 3 mmHg, the estimated right ventricular systolic pressure is 15.1 mmHg. Left Atrium: Left atrial size was normal in size. Right Atrium: Right atrial size was normal in size. Pericardium: A small pericardial effusion is present. There is no evidence of cardiac tamponade. Mitral Valve: The mitral valve is degenerative in appearance. There is moderate thickening of the mitral valve leaflet(s). There is moderate calcification of the mitral valve leaflet(s). Moderate mitral annular calcification. Trivial mitral valve regurgitation. Tricuspid Valve: The tricuspid valve is normal in structure. Tricuspid valve regurgitation is trivial. Aortic Valve: The aortic valve is tricuspid. There is moderate calcification of the aortic valve.  There is moderate thickening of the aortic valve. Aortic valve regurgitation is not visualized. Mild aortic stenosis is present. Aortic valve mean gradient measures 10.4 mmHg. Aortic valve peak gradient measures 23.0 mmHg. Aortic valve area, by VTI measures 1.08 cm. Pulmonic Valve: The pulmonic valve was grossly normal. Pulmonic valve regurgitation is trivial. Aorta: Aortic dilatation noted. There is borderline dilatation of the ascending aorta, measuring 38 mm. Venous: The inferior vena cava is normal in size with greater than 50% respiratory variability, suggesting right atrial pressure of 3 mmHg. IAS/Shunts: The atrial septum is grossly normal. Additional Comments: There is a moderate pleural effusion in the left lateral  region.  LEFT VENTRICLE PLAX 2D LVIDd:         3.30 cm   Diastology LVIDs:         2.20 cm   LV e' medial:    8.58 cm/s LV PW:         1.10 cm   LV E/e' medial:  7.9 LV IVS:        1.10 cm   LV e' lateral:   6.96 cm/s LVOT diam:     1.80 cm   LV E/e' lateral: 9.8 LV SV:         47 LV SV Index:   24 LVOT Area:     2.54 cm  RIGHT VENTRICLE            IVC RV S prime:     8.27 cm/s  IVC diam: 1.10 cm TAPSE (M-mode): 1.3 cm LEFT ATRIUM             Index        RIGHT ATRIUM           Index LA diam:        3.70 cm 1.86 cm/m   RA Area:     14.60 cm LA Vol (A2C):   61.2 ml 30.78 ml/m  RA Volume:   34.00 ml  17.10 ml/m LA Vol (A4C):   50.8 ml 25.55 ml/m LA Biplane Vol: 60.7 ml 30.53 ml/m  AORTIC VALVE AV Area (Vmax):    0.97 cm AV Area (Vmean):   1.02 cm AV Area (VTI):     1.08 cm AV Vmax:           240.00 cm/s AV Vmean:          147.500 cm/s AV VTI:            0.436 m AV Peak Grad:      23.0 mmHg AV Mean Grad:      10.4 mmHg LVOT Vmax:         91.40 cm/s LVOT Vmean:        58.867 cm/s LVOT VTI:          0.186 m LVOT/AV VTI ratio: 0.43  AORTA Ao Root diam: 3.40 cm Ao Asc diam:  3.80 cm MITRAL VALVE                TRICUSPID VALVE MV Area (PHT): 2.37 cm     TR Peak grad:   12.1 mmHg MV Decel  Time: 320 msec     TR Vmax:        174.00 cm/s MV E velocity: 68.10 cm/s MV A velocity: 108.00 cm/s  SHUNTS MV E/A ratio:  0.63         Systemic VTI:  0.19 m                             Systemic Diam: 1.80 cm Laurance Flatten MD Electronically signed by Laurance Flatten MD Signature Date/Time: 11/24/2022/10:27:28 AM    Final    DG Chest 2 View  Result Date: 11/23/2022 CLINICAL DATA:  Hemoptysis EXAM: CHEST - 2 VIEW COMPARISON:  11/22/2022 FINDINGS: Prior CABG. Heart and mediastinal contours within normal limits. Small left pleural effusion with left lower lobe atelectasis or infiltrate. Right lung clear. No acute bony abnormality. IMPRESSION: Small left pleural effusion with left lower lobe atelectasis or infiltrate. Electronically Signed   By: Charlett Nose M.D.   On: 11/23/2022 17:28  CT Angio Chest PE W and/or Wo Contrast  Result Date: 11/22/2022 CLINICAL DATA:  Pulmonary embolism suspected. High probability. Cough with some hemoptysis. EXAM: CT ANGIOGRAPHY CHEST WITH CONTRAST TECHNIQUE: Multidetector CT imaging of the chest was performed using the standard protocol during bolus administration of intravenous contrast. Multiplanar CT image reconstructions and MIPs were obtained to evaluate the vascular anatomy. RADIATION DOSE REDUCTION: This exam was performed according to the departmental dose-optimization program which includes automated exposure control, adjustment of the mA and/or kV according to patient size and/or use of iterative reconstruction technique. CONTRAST:  75mL OMNIPAQUE IOHEXOL 350 MG/ML SOLN COMPARISON:  Radiography same day.  Previous chest CT 07/11/2022. FINDINGS: Cardiovascular: Heart size upper limits of normal. Moderate size pericardial effusion is present. There is extensive coronary artery calcification. There is aortic atherosclerotic calcification. Pulmonary arterial opacification is good. No pulmonary emboli are seen. Mediastinum/Nodes: No mass or adenopathy. Lungs/Pleura:  Bilateral pleural effusions layering dependently, larger on the left than the right. Mild dependent atelectasis in the right lower lobe. Subtotal collapse of the left lower lobe. Previously seen left lower lobe nodule cannot be evaluated given the pulmonary collapse. Upper Abdomen: Pronounced cirrhosis of the liver. No acute upper abdominal finding. Musculoskeletal: No significant finding. Review of the MIP images confirms the above findings. IMPRESSION: 1. No pulmonary emboli. 2. Moderate size pericardial effusion. 3. Extensive coronary artery calcification. 4. Bilateral pleural effusions layering dependently, larger on the left than the right. Subtotal collapse of the left lower lobe. 5. Cirrhosis of the liver. Aortic Atherosclerosis (ICD10-I70.0). Electronically Signed   By: Paulina Fusi M.D.   On: 11/22/2022 14:52   DG Chest Portable 1 View  Result Date: 11/22/2022 CLINICAL DATA:  Cough EXAM: PORTABLE CHEST 1 VIEW COMPARISON:  11/17/2022 FINDINGS: Previous median sternotomy and CABG. The right lung is clear except for mild atelectasis at the base. On the left, there is more pronounced density in the left lower chest that could be due to a combination of left lower lobe collapse/infiltrate and pleural fluid. IMPRESSION: Abnormal density in the left lower chest that could be due to a combination of left lower lobe collapse/infiltrate and pleural fluid. Electronically Signed   By: Paulina Fusi M.D.   On: 11/22/2022 14:47    Pending Labs Unresulted Labs (From admission, onward)     Start     Ordered   11/30/22 0500  Creatinine, serum  (enoxaparin (LOVENOX)    CrCl >/= 30 ml/min)  Weekly,   R     Comments: while on enoxaparin therapy    11/23/22 2142   11/24/22 0829  Brain natriuretic peptide  Once,   R        11/24/22 0828   11/24/22 0829  Procalcitonin  Once,   R       References:    Procalcitonin Lower Respiratory Tract Infection AND Sepsis Procalcitonin Algorithm   11/24/22 0828             Vitals/Pain Today's Vitals   11/24/22 0900 11/24/22 0915 11/24/22 1045 11/24/22 1100  BP:   (!) 140/67   Pulse:  66 68   Resp:  18 20   Temp:  97.9 F (36.6 C)  98.1 F (36.7 C)  TempSrc:  Oral  Oral  SpO2:  96% 95%   Weight:      Height:      PainSc: 3        Isolation Precautions No active isolations  Medications Medications  aspirin EC tablet 325 mg (  325 mg Oral Given 11/24/22 1052)  amiodarone (PACERONE) tablet 200 mg (200 mg Oral Given 11/24/22 1051)  atorvastatin (LIPITOR) tablet 80 mg (80 mg Oral Given 11/24/22 1051)  fenofibrate tablet 160 mg (has no administration in time range)  hydrALAZINE (APRESOLINE) tablet 25 mg (25 mg Oral Given 11/24/22 0633)  losartan (COZAAR) tablet 100 mg (100 mg Oral Given 11/24/22 1052)  metoprolol tartrate (LOPRESSOR) tablet 25 mg (25 mg Oral Given 11/24/22 1051)  PARoxetine (PAXIL) tablet 40 mg (40 mg Oral Given 11/24/22 0732)  gabapentin (NEURONTIN) capsule 300 mg (300 mg Oral Given 11/24/22 0025)  enoxaparin (LOVENOX) injection 40 mg (40 mg Subcutaneous Given 11/24/22 0026)  acetaminophen (TYLENOL) tablet 650 mg (has no administration in time range)    Or  acetaminophen (TYLENOL) suppository 650 mg (has no administration in time range)  oxyCODONE (Oxy IR/ROXICODONE) immediate release tablet 5 mg (5 mg Oral Given 11/24/22 0731)  pantoprazole (PROTONIX) EC tablet 40 mg (40 mg Oral Given 11/24/22 1052)  furosemide (LASIX) injection 20 mg (20 mg Intravenous Given 11/23/22 2215)    Mobility walks with person assist     Focused Assessments Cardiac Assessment Handoff:    No results found for: "CKTOTAL", "CKMB", "CKMBINDEX", "TROPONINI" Lab Results  Component Value Date   DDIMER 0.91 (H) 07/09/2022   Does the Patient currently have chest pain? Yes    R Recommendations: See Admitting Provider Note  Report given to:   Additional Notes: Echo completed. Uses urinal appropriately

## 2022-11-24 NOTE — Consult Note (Addendum)
[address]       [place] [address]             [phone]        Collin Bridges [facility] Medical Record #[id number] Date of Birth: [birth year]  Referring:  Alan Mulder, MD Primary Care: Corwin Levins, MD Primary Cardiologist:Roscoe Cleophus Molt, MD  Chief Complaint:   Hemoptysis 2 weeks post CABG x 5   History of Present Illness:   Collin Bridges is a 77 year old gentleman well-known to our service.  He has a past medical history notable for hypertension, dyslipidemia, stage IIIa chronic kidney disease, type 2 diabetes mellitus, benign prostatic hyperplasia, history of CVA, history of non-STEMI, and hepatic cirrhosis.    He first came to our attention back in February of this year when he was admitted with pneumonia leading to sepsis with concurrent non-ST elevation myocardial infarction.  He had a left heart catheterization demonstrating severe three-vessel coronary artery disease.  He had considerable debility from these multiple comorbidities and was not felt to be appropriate candidate for surgical revascularization at that time.  He was treated medically for his coronary disease and and also had extensive physical rehab.  He was seen again by Dr. Leafy Ro in mid June of this year reporting progressive exertional angina with minimal activity requiring nitroglycerin several times a day.  The decision was made to proceed with coronary bypass grafting.    He was admitted for elective surgery on 11/10/2022 and had CABG x 5 by Dr. Leafy Ro.  Postoperative course was notable for thrombocytopenia with a platelet count in the low 50,000 range with eventual recovery to 120,000 by the time of his discharge.  He also had atrial fibrillation during that admission with conversion back to sinus rhythm and was discharged on oral amiodarone.  He was felt to require additional physical rehab so was discharged to skilled nursing facility on 11/21/2022.  He presented to the  emergency room on the following day with a primary complaint of sore throat and hemoptysis.  A CTA of the chest was obtained showing no pulmonary embolus but there was a moderate pericardial effusion and bilateral left greater than right pleural effusions.  There was felt to be "subtle collapse" in the left lower lobe and evidence of significant hepatic cirrhosis.  I do not see any completed provider notes from that ED visit but Collin Bridges was discharged back to the skilled nursing facility.  He returned to the ED yesterday, again complaining of hemoptysis that he felt was getting worse.  Laboratory data indicated normal white blood count and stable hemoglobin at 10.8 g.  Renal function was stable with a creatinine of 1.2.   Echocardiogram was performed earlier today showing left ventricular ejection fraction of 60 to 65%, normal RV function, a small pericardial effusion, and mild aortic stenosis. He is being admitted to the hospital by the medicine team for further workup.  Pulmonary medicine has been consulted to assist with evaluation of the hemoptysis. Currently, Collin Bridges is resting in bed with significant other at the bedside.  He denies any pain or cough.  He just finished eating no new problems.  He denies sore throat.  He tells me he has not had a trace of hemoptysis since he arrived at the emergency room 48 hours ago.  He described the previous episodes of hemoptysis as occurring after he awoke with a sensation of airway congestion and of a lump in his  throat.  When he coughed to clear it, he said he produced a bright red blood clot.    Current Activity/ Functional Status: Patient is not independent with mobility/ambulation, transfers, ADL's, IADL's.   Zubrod Score: At the time of surgery this patient's most appropriate activity status/level should be described as: []     0    Normal activity, no symptoms []     1    Restricted in physical strenuous activity but ambulatory, able to do out  light work []     2    Ambulatory and capable of self care, unable to do work activities, up and about                 more than 50%  Of the time                            [x]     3    Only limited self care, in bed greater than 50% of waking hours []     4    Completely disabled, no self care, confined to bed or chair []     5    Moribund  Past Medical History:  Diagnosis Date   Allergy    seasonal   Anginal pain (HCC)    Anxiety    CHF (congestive heart failure) (HCC)    Coronary artery disease    Depression    Diabetes mellitus without complication (HCC)    Type 2   GERD (gastroesophageal reflux disease)    Pt states "take ambien for this"   Hepatitis C    HLD (hyperlipidemia) 08/09/2017   Hypertension    Hypoglycemia    Pneumonia 06/2022   Stroke (HCC) 03/2021   TIA    Past Surgical History:  Procedure Laterality Date   APPENDECTOMY     BUBBLE STUDY  06/16/2021   Procedure: BUBBLE STUDY;  Surgeon: Sande Rives, MD;  Location: [facility] ENDOSCOPY;  Service: Cardiovascular;;   COLONOSCOPY     CORONARY ARTERY BYPASS GRAFT N/A 11/10/2022   Procedure: CORONARY ARTERY BYPASS GRAFTING (CABG) X5 USING LEFT INTERNAL MAMMARY ARTERY (LIMA) AND ENDOSCOPICALLY HARVESTED RIGHT AND LEFT GREATER SAPHENOUS VEIN;  Surgeon: Eugenio Hoes, MD;  Location: [facility] OR;  Service: Open Heart Surgery;  Laterality: N/A;   FRACTURE SURGERY Left    Ankle   LEFT HEART CATH AND CORONARY ANGIOGRAPHY N/A 07/12/2022   Procedure: LEFT HEART CATH AND CORONARY ANGIOGRAPHY;  Surgeon: Marykay Lex, MD;  Location: [facility] INVASIVE CV LAB;  Service: Cardiovascular;  Laterality: N/A;   TEE WITHOUT CARDIOVERSION N/A 06/16/2021   Procedure: TRANSESOPHAGEAL ECHOCARDIOGRAM (TEE);  Surgeon: Sande Rives, MD;  Location: [facility] ENDOSCOPY;  Service: Cardiovascular;  Laterality: N/A;   TEE WITHOUT CARDIOVERSION N/A 11/10/2022   Procedure: TRANSESOPHAGEAL ECHOCARDIOGRAM;  Surgeon: Eugenio Hoes, MD;  Location: [facility] OR;   Service: Open Heart Surgery;  Laterality: N/A;   TONSILECTOMY, ADENOIDECTOMY, BILATERAL MYRINGOTOMY AND TUBES  1955    Social History   Tobacco Use  Smoking Status Former  Smokeless Tobacco Never  Tobacco Comments   quit 36yrs ago, smoke a cigar occassionally.    Social History   Substance and Sexual Activity  Alcohol Use Not Currently   Comment: Quit 30 years ago     No Known Allergies  Current Facility-Administered Medications  Medication Dose Route Frequency Provider Last Rate Last Admin   acetaminophen (TYLENOL) tablet 650 mg  650 mg Oral Q6H PRN Alan Mulder,  MD       Or   acetaminophen (TYLENOL) suppository 650 mg  650 mg Rectal Q6H PRN Dorrell, Robert, MD       amiodarone (PACERONE) tablet 200 mg  200 mg Oral BID Alan Mulder, MD   200 mg at 11/24/22 1051   aspirin EC tablet 325 mg  325 mg Oral Daily Alan Mulder, MD   325 mg at 11/24/22 1052   atorvastatin (LIPITOR) tablet 80 mg  80 mg Oral Daily Dorrell, Molly Maduro, MD   80 mg at 11/24/22 1051   enoxaparin (LOVENOX) injection 40 mg  40 mg Subcutaneous Q24H Alan Mulder, MD   40 mg at 11/24/22 0026   fenofibrate tablet 160 mg  160 mg Oral Daily Dorrell, Robert, MD       gabapentin (NEURONTIN) capsule 300 mg  300 mg Oral QHS Alan Mulder, MD   300 mg at 11/24/22 0025   hydrALAZINE (APRESOLINE) tablet 25 mg  25 mg Oral Q8H Dorrell, Molly Maduro, MD   25 mg at 11/24/22 9604   insulin aspart (novoLOG) injection 0-5 Units  0-5 Units Subcutaneous QHS Pahwani, Daleen Bo, MD       insulin aspart (novoLOG) injection 0-9 Units  0-9 Units Subcutaneous TID WC Pahwani, Ravi, MD       losartan (COZAAR) tablet 100 mg  100 mg Oral Daily Dorrell, Robert, MD   100 mg at 11/24/22 1052   metoprolol tartrate (LOPRESSOR) tablet 25 mg  25 mg Oral BID Alan Mulder, MD   25 mg at 11/24/22 1051   oxyCODONE (Oxy IR/ROXICODONE) immediate release tablet 5 mg  5 mg Oral Q4H PRN Alan Mulder, MD   5 mg at 11/24/22 0731   pantoprazole (PROTONIX)  EC tablet 40 mg  40 mg Oral Daily Alan Mulder, MD   40 mg at 11/24/22 1052   PARoxetine (PAXIL) tablet 40 mg  40 mg Oral Warren Lacy, MD   40 mg at 11/24/22 5409   Current Outpatient Medications  Medication Sig Dispense Refill   amiodarone (PACERONE) 200 MG tablet Take 1 tablet (200 mg total) by mouth 2 (two) times daily. X 7 days, then decrease to 200 mg daily     aspirin EC 325 MG tablet Take 1 tablet (325 mg total) by mouth daily.     atorvastatin (LIPITOR) 80 MG tablet Take 1 tablet (80 mg total) by mouth daily. 30 tablet 2   cholecalciferol (VITAMIN D3) 25 MCG (1000 UNIT) tablet Take 1 tablet (1,000 Units total) by mouth daily. Take 1,000 Units by mouth daily. 90 tablet 2   Evolocumab (REPATHA SURECLICK) 140 MG/ML SOAJ Inject 140 mg into the skin every 14 days. 6 mL 3   fenofibrate 160 MG tablet Take 1 tablet (160 mg total) by mouth daily. 90 tablet 1   fluticasone (FLONASE) 50 MCG/ACT nasal spray Place 2 sprays into both nostrils daily. 48 g 1   gabapentin (NEURONTIN) 300 MG capsule Take 1 capsule (300 mg total) by mouth at bedtime. 90 capsule 3   hydrALAZINE (APRESOLINE) 25 MG tablet Take 1 tablet (25 mg total) by mouth every 8 (eight) hours. 90 tablet 2   losartan (COZAAR) 100 MG tablet Take 1 tablet (100 mg total) by mouth daily. 180 tablet 1   magnesium oxide (MAG-OX) 400 (240 Mg) MG tablet Take 400 mg by mouth daily.     Melatonin 10 MG TABS Take 10 mg by mouth at bedtime.     metFORMIN (GLUCOPHAGE-XR) 500 MG 24 hr tablet  Take 1 tablet (500 mg) by mouth daily with breakfast. 180 tablet 1   metoprolol tartrate (LOPRESSOR) 25 MG tablet Take 1 tablet (25 mg total) by mouth 2 (two) times daily. 60 tablet 1   nitroGLYCERIN (NITROSTAT) 0.4 MG SL tablet Place 1 tablet under the tongue every 5 minutes as needed for chest pain. 25 tablet 3   PARoxetine (PAXIL) 40 MG tablet Take 1 tablet (40 mg total) by mouth every morning. 90 tablet 1   vitamin E 180 MG (400 UNITS) capsule Take  400 Units by mouth daily.     zolpidem (AMBIEN) 10 MG tablet TAKE 1 TABLET(10 MG) BY MOUTH AT BEDTIME AS NEEDED FOR SLEEP (Patient taking differently: Take 5-10 mg by mouth at bedtime.) 90 tablet 1    (Not in a hospital admission)   Family History  Problem Relation Age of Onset   Lung disease Mother    Hyperlipidemia Father    Hypertension Father      Review of Systems:     Cardiac Review of Systems: Y or  [    ]= no  Chest Pain [    ]  Resting SOB [   ] Exertional SOB  [  ]  Orthopnea [  ]   Pedal Edema [   ]    Palpitations [  ] Syncope  [  ]   Presyncope [   ]  General Review of Systems: [Y] = yes [  ]=no Constitional: recent weight change [  ]; anorexia [  ]; fatigue [  ]; nausea [  ]; night sweats [  ]; fever [  ]; or chills [  ]                                                                Eye : blurred vision [  ]; diplopia [   ]; vision changes [  ];  Amaurosis fugax[  ]; Resp: cough [  ];  wheezing[  ];  hemoptysis[ x ]; shortness of breath[  ]; paroxysmal nocturnal dyspnea[  ]; dyspnea on exertion[  ]; or orthopnea[  ];  GI:  gallstones[  ], vomiting[  ];  dysphagia[  ]; melena[  ];  hematochezia [  ]; heartburn[  ];   Hx of  Colonoscopy[  ]; GU: kidney stones [  ]; hematuria[  ];   dysuria [  ];  nocturia[  ];  history of     obstruction [  ]; urinary frequency [  ]             Skin: rash, swelling[  ];, hair loss[  ];  peripheral edema[  ];  or itching[  ]; Musculosketetal: myalgias[  ];  joint swelling[  ];  joint erythema[  ];  joint pain[  ];  back pain[  ];  Heme/Lymph: bruising[  ];  bleeding[  ];  anemia[  ];  Neuro: TIA[  ];  headaches[  ];  stroke[  ];  vertigo[  ];  seizures[  ];   paresthesias[  ];  difficulty walking[  ];  Psych:depression[  ]; anxiety[  ];  Endocrine: diabetes[ x ];  thyroid dysfunction[  ];  Physical Exam: BP 125/66   Pulse 64   Temp 98.1 F (36.7 C) (Oral)   Resp (!) 21   Ht 6' (1.829 m)   Wt 77.1 kg   SpO2 96%    BMI 23.06 kg/m    General appearance: alert, cooperative, and no distress Head: Normocephalic, without obvious abnormality, atraumatic Neck: no adenopathy, no carotid bruit, no JVD, and supple, symmetrical, trachea midline Lymph nodes: No cervical or clavicular adenopathy Resp: Respirations are normal and unlabored.  Breath sounds are clear to auscultation Cardio: Regular rate and rhythm with occasional PAC.  He has a grade 2/6 systolic murmur.  EKG shows a right bundle branch block which is stable GI: Soft, nontender, active bowel sounds Extremities: All well-perfused with no deformity.  He has expected bruising in both lower extremities from bilateral endoscopic vein harvesting.  No induration along the tunnel in the left leg but no drainage and no significant tenderness Neurologic: Grossly normal Wound: Sternotomy incision is healing with no sign of complication.  Sternum is stable.  Diagnostic Studies & Laboratory data:  ECHOCARDIOGRAM REPORT       Patient Name:   Collin Bridges Date of Exam: 11/24/2022 Medical Rec #:  [id number]             Height:       72.0 in Accession #:    [id number]            Weight:       170.0 lb Date of Birth:  [birth year]            BSA:          1.988 m Patient Age:    76 years              BP:           137/73 mmHg Patient Gender: M                     HR:           71 bpm. Exam Location:  Inpatient  Procedure: 2D Echo, Cardiac Doppler and Color Doppler  Indications:    Pericardial effusion I31.3   History:        Patient has prior history of Echocardiogram examinations, most                 recent 07/09/2022. CHF, Previous Myocardial Infarction and CAD,                 Prior CABG, Stroke, Signs/Symptoms:Chest Pain; Risk                 Factors:Hypertension, Diabetes and Dyslipidemia. CKD, stage 3.   Sonographer:    Lucendia Herrlich Referring Phys: [id number] ROBERT DORRELL  IMPRESSIONS    1. Left ventricular ejection fraction, by  estimation, is 60 to 65%. The left ventricle has normal function. The left ventricle has no regional wall motion abnormalities. Diastolic function indeterminant due to significant MAC.  2. Right ventricular systolic function is normal. The right ventricular size is normal. There is normal pulmonary artery systolic pressure. The estimated right ventricular systolic pressure is 15.1 mmHg.  3. A small pericardial effusion is present. There is no evidence of cardiac tamponade. Moderate pleural effusion in the left lateral region.  4. The mitral valve is degenerative. Trivial mitral valve regurgitation. Moderate mitral annular calcification.  5. The aortic valve is tricuspid. There is moderate calcification of the aortic valve. There is moderate thickening  of the aortic valve. Aortic valve regurgitation is not visualized. Mild aortic valve stenosis. Aortic valve mean gradient measures 10.4 mmHg. Aortic valve Vmax measures 2.40 m/s.  6. Aortic dilatation noted. There is borderline dilatation of the ascending aorta, measuring 38 mm.  7. The inferior vena cava is normal in size with greater than 50% respiratory variability, suggesting right atrial pressure of 3 mmHg.  Comparison(s): No significant change from prior study.  FINDINGS  Left Ventricle: Left ventricular ejection fraction, by estimation, is 60 to 65%. The left ventricle has normal function. The left ventricle has no regional wall motion abnormalities. The left ventricular internal cavity size was normal in size. There is  no left ventricular hypertrophy. Diastolic function indeterminant due to significant MAC.  Right Ventricle: The right ventricular size is normal. No increase in right ventricular wall thickness. Right ventricular systolic function is normal. There is normal pulmonary artery systolic pressure. The tricuspid regurgitant velocity is 1.74 m/s, and  with an assumed right atrial pressure of 3 mmHg, the estimated  right ventricular systolic pressure is 15.1 mmHg.  Left Atrium: Left atrial size was normal in size.  Right Atrium: Right atrial size was normal in size.  Pericardium: A small pericardial effusion is present. There is no evidence of cardiac tamponade.  Mitral Valve: The mitral valve is degenerative in appearance. There is moderate thickening of the mitral valve leaflet(s). There is moderate calcification of the mitral valve leaflet(s). Moderate mitral annular calcification. Trivial mitral valve regurgitation.  Tricuspid Valve: The tricuspid valve is normal in structure. Tricuspid valve regurgitation is trivial.  Aortic Valve: The aortic valve is tricuspid. There is moderate calcification of the aortic valve. There is moderate thickening of the aortic valve. Aortic valve regurgitation is not visualized. Mild aortic stenosis is present. Aortic valve mean gradient measures 10.4 mmHg. Aortic valve peak gradient measures 23.0 mmHg. Aortic valve area, by VTI measures 1.08 cm.  Pulmonic Valve: The pulmonic valve was grossly normal. Pulmonic valve regurgitation is trivial.  Aorta: Aortic dilatation noted. There is borderline dilatation of the ascending aorta, measuring 38 mm.  Venous: The inferior vena cava is normal in size with greater than 50% respiratory variability, suggesting right atrial pressure of 3 mmHg.  IAS/Shunts: The atrial septum is grossly normal.  Additional Comments: There is a moderate pleural effusion in the left lateral region.    LEFT VENTRICLE PLAX 2D LVIDd:         3.30 cm   Diastology LVIDs:         2.20 cm   LV e' medial:    8.58 cm/s LV PW:         1.10 cm   LV E/e' medial:  7.9 LV IVS:        1.10 cm   LV e' lateral:   6.96 cm/s LVOT diam:     1.80 cm   LV E/e' lateral: 9.8 LV SV:         47 LV SV Index:   24 LVOT Area:     2.54 cm    RIGHT VENTRICLE            IVC RV S prime:     8.27 cm/s  IVC diam: 1.10 cm TAPSE (M-mode): 1.3 cm  LEFT  ATRIUM             Index        RIGHT ATRIUM           Index LA diam:  3.70 cm 1.86 cm/m   RA Area:     14.60 cm LA Vol (A2C):   61.2 ml 30.78 ml/m  RA Volume:   34.00 ml  17.10 ml/m LA Vol (A4C):   50.8 ml 25.55 ml/m LA Biplane Vol: 60.7 ml 30.53 ml/m  AORTIC VALVE AV Area (Vmax):    0.97 cm AV Area (Vmean):   1.02 cm AV Area (VTI):     1.08 cm AV Vmax:           240.00 cm/s AV Vmean:          147.500 cm/s AV VTI:            0.436 m AV Peak Grad:      23.0 mmHg AV Mean Grad:      10.4 mmHg LVOT Vmax:         91.40 cm/s LVOT Vmean:        58.867 cm/s LVOT VTI:          0.186 m LVOT/AV VTI ratio: 0.43   AORTA Ao Root diam: 3.40 cm Ao Asc diam:  3.80 cm  MITRAL VALVE                TRICUSPID VALVE MV Area (PHT): 2.37 cm     TR Peak grad:   12.1 mmHg MV Decel Time: 320 msec     TR Vmax:        174.00 cm/s MV E velocity: 68.10 cm/s MV A velocity: 108.00 cm/s  SHUNTS MV E/A ratio:  0.63         Systemic VTI:  0.19 m                             Systemic Diam: 1.80 cm  Laurance Flatten MD Electronically signed by Laurance Flatten MD Signature Date/Time: 11/24/2022/10:27:28 AM       Final          Recent Radiology Findings:     DG Chest 2 View  Result Date: 11/23/2022 CLINICAL DATA:  Hemoptysis EXAM: CHEST - 2 VIEW COMPARISON:  11/22/2022 FINDINGS: Prior CABG. Heart and mediastinal contours within normal limits. Small left pleural effusion with left lower lobe atelectasis or infiltrate. Right lung clear. No acute bony abnormality. IMPRESSION: Small left pleural effusion with left lower lobe atelectasis or infiltrate. Electronically Signed   By: Charlett Nose M.D.   On: 11/23/2022 17:28   CT Angio Chest PE W and/or Wo Contrast  Result Date: 11/22/2022 CLINICAL DATA:  Pulmonary embolism suspected. High probability. Cough with some hemoptysis. EXAM: CT ANGIOGRAPHY CHEST WITH CONTRAST TECHNIQUE: Multidetector CT imaging of the chest was performed using the  standard protocol during bolus administration of intravenous contrast. Multiplanar CT image reconstructions and MIPs were obtained to evaluate the vascular anatomy. RADIATION DOSE REDUCTION: This exam was performed according to the departmental dose-optimization program which includes automated exposure control, adjustment of the mA and/or kV according to patient size and/or use of iterative reconstruction technique. CONTRAST:  75mL OMNIPAQUE IOHEXOL 350 MG/ML SOLN COMPARISON:  Radiography same day.  Previous chest CT 07/11/2022. FINDINGS: Cardiovascular: Heart size upper limits of normal. Moderate size pericardial effusion is present. There is extensive coronary artery calcification. There is aortic atherosclerotic calcification. Pulmonary arterial opacification is good. No pulmonary emboli are seen. Mediastinum/Nodes: No mass or adenopathy. Lungs/Pleura: Bilateral pleural effusions layering dependently, larger on the left than the right. Mild dependent atelectasis in the right lower lobe. Subtotal  collapse of the left lower lobe. Previously seen left lower lobe nodule cannot be evaluated given the pulmonary collapse. Upper Abdomen: Pronounced cirrhosis of the liver. No acute upper abdominal finding. Musculoskeletal: No significant finding. Review of the MIP images confirms the above findings. IMPRESSION: 1. No pulmonary emboli. 2. Moderate size pericardial effusion. 3. Extensive coronary artery calcification. 4. Bilateral pleural effusions layering dependently, larger on the left than the right. Subtotal collapse of the left lower lobe. 5. Cirrhosis of the liver. Aortic Atherosclerosis (ICD10-I70.0). Electronically Signed   By: Paulina Fusi M.D.   On: 11/22/2022 14:52   DG Chest Portable 1 View  Result Date: 11/22/2022 CLINICAL DATA:  Cough EXAM: PORTABLE CHEST 1 VIEW COMPARISON:  11/17/2022 FINDINGS: Previous median sternotomy and CABG. The right lung is clear except for mild atelectasis at the base. On the  left, there is more pronounced density in the left lower chest that could be due to a combination of left lower lobe collapse/infiltrate and pleural fluid. IMPRESSION: Abnormal density in the left lower chest that could be due to a combination of left lower lobe collapse/infiltrate and pleural fluid. Electronically Signed   By: Paulina Fusi M.D.   On: 11/22/2022 14:47     I have independently reviewed the above radiologic studies and discussed with the patient   Recent Lab Findings: Lab Results  Component Value Date   WBC 8.6 11/24/2022   HGB 11.0 (L) 11/24/2022   HCT 33.2 (L) 11/24/2022   PLT 289 11/24/2022   GLUCOSE 112 (H) 11/23/2022   CHOL 88 (L) 10/31/2022   TRIG 122 10/31/2022   HDL 49 10/31/2022   LDLDIRECT 146.0 06/03/2022   LDLCALC 17 10/31/2022   ALT 27 11/22/2022   AST 45 (H) 11/22/2022   NA 137 11/23/2022   K 4.0 11/23/2022   CL 100 11/23/2022   CREATININE 1.24 11/23/2022   BUN 13 11/23/2022   CO2 25 11/23/2022   TSH 0.98 06/03/2022   INR 1.3 (H) 11/22/2022   HGBA1C 5.8 (H) 11/09/2022      Assessment / Plan:      -Pleasant 77 year old gentleman status post CABG x 5 on 11/10/2022.  He was discharged from the hospital on 11/21/2022 and had ED visits in the last 72 hours with the primary complaint of hemoptysis.  He has had no observed hemoptysis since his arrival to the ED about 48 hours ago.  His hemoglobin is stable.  He admits to mild sore throat but he is having no difficulty swallowing and is tolerating oral intake with no trouble.  From CT surgery perspective, mild hemoptysis early postoperatively (within the first few days) would not be unusual following endotracheal intubation and transesophageal echo probe placement in the operating room.  For that reason, this should be treated as a new issue and worked up accordingly.  Per the progress notes, pulmonary medicine has been consulted for consideration and possible bronchoscopy.   -Admission chest x-ray, CT scan, and  echocardiogram have been reviewed.  He does have a small pericardial effusion with no evidence of tamponade physiology.  Biventricular function is normal.  He also has a small bilateral pleural effusions that are not expected following his coronary bypass grafting.  These do not require intervention at this time and will be followed in the outpatient setting.  -CT surgery will follow.   I  spent 25 minutes counseling the patient face to face.   Leary Roca, PA-C  11/24/2022 1:20 PM  Patient seen and examined,  chart reviewed, Ct and CXR images and echo report reviewed.  Wounds are clean and healing well. Had a couple of episodes of hemoptysis.  No obvious source on CT, but does have some left lower atelectasis from a small- moderate effusion. Small pericardial effusion is expected and no intervention required.  Salvatore Decent Dorris Fetch, MD [facility] [phone]

## 2022-11-24 NOTE — Progress Notes (Signed)
  Echocardiogram 2D Echocardiogram has been performed.  Collin Bridges 11/24/2022, 9:31 AM

## 2022-11-24 NOTE — Plan of Care (Signed)
  Problem: Education: Goal: Will demonstrate proper wound care and an understanding of methods to prevent future damage Outcome: Progressing Goal: Knowledge of disease or condition will improve Outcome: Progressing Goal: Knowledge of the prescribed therapeutic regimen will improve Outcome: Progressing Goal: Individualized Educational Video(s) Outcome: Progressing   Problem: Activity: Goal: Risk for activity intolerance will decrease Outcome: Progressing   Problem: Clinical Measurements: Goal: Postoperative complications will be avoided or minimized Outcome: Progressing   Problem: Cardiac: Goal: Will achieve and/or maintain hemodynamic stability Outcome: Progressing   Problem: Respiratory: Goal: Respiratory status will improve Outcome: Progressing   Problem: Skin Integrity: Goal: Wound healing without signs and symptoms of infection Outcome: Progressing Goal: Risk for impaired skin integrity will decrease Outcome: Progressing   Problem: Urinary Elimination: Goal: Ability to achieve and maintain adequate renal perfusion and functioning will improve Outcome: Progressing   Problem: Education: Goal: Ability to describe self-care measures that may prevent or decrease complications (Diabetes Survival Skills Education) will improve Outcome: Progressing Goal: Individualized Educational Video(s) Outcome: Progressing   Problem: Coping: Goal: Ability to adjust to condition or change in health will improve Outcome: Progressing   Problem: Fluid Volume: Goal: Ability to maintain a balanced intake and output will improve Outcome: Progressing   Problem: Health Behavior/Discharge Planning: Goal: Ability to identify and utilize available resources and services will improve Outcome: Progressing Goal: Ability to manage health-related needs will improve Outcome: Progressing   Problem: Metabolic: Goal: Ability to maintain appropriate glucose levels will improve Outcome:  Progressing   Problem: Nutritional: Goal: Maintenance of adequate nutrition will improve Outcome: Progressing Goal: Progress toward achieving an optimal weight will improve Outcome: Progressing   Problem: Skin Integrity: Goal: Risk for impaired skin integrity will decrease Outcome: Progressing   Problem: Tissue Perfusion: Goal: Adequacy of tissue perfusion will improve Outcome: Progressing   Problem: Education: Goal: Knowledge of General Education information will improve Description: Including pain rating scale, medication(s)/side effects and non-pharmacologic comfort measures Outcome: Progressing   Problem: Health Behavior/Discharge Planning: Goal: Ability to manage health-related needs will improve Outcome: Progressing   Problem: Clinical Measurements: Goal: Ability to maintain clinical measurements within normal limits will improve Outcome: Progressing Goal: Will remain free from infection Outcome: Progressing Goal: Diagnostic test results will improve Outcome: Progressing Goal: Respiratory complications will improve Outcome: Progressing Goal: Cardiovascular complication will be avoided Outcome: Progressing   Problem: Activity: Goal: Risk for activity intolerance will decrease Outcome: Progressing   Problem: Nutrition: Goal: Adequate nutrition will be maintained Outcome: Progressing   Problem: Coping: Goal: Level of anxiety will decrease Outcome: Progressing   Problem: Elimination: Goal: Will not experience complications related to bowel motility Outcome: Progressing Goal: Will not experience complications related to urinary retention Outcome: Progressing   Problem: Pain Managment: Goal: General experience of comfort will improve Outcome: Progressing   Problem: Safety: Goal: Ability to remain free from injury will improve Outcome: Progressing   Problem: Skin Integrity: Goal: Risk for impaired skin integrity will decrease Outcome: Progressing

## 2022-11-24 NOTE — Progress Notes (Addendum)
PROGRESS NOTE    Collin Bridges  WUJ:[id number] DOB: [birth year] DOA: 11/23/2022 PCP: Corwin Levins, MD   Brief Narrative:  HPI: Collin Bridges is a 77 y.o. male with medical history significant of CAD status post recent CABG, CHF, type 2 diabetes, GERD, hypertension, prior stroke who presented to the emergency department due to 2 days of hemoptysis.  He was seen in the ER yesterday and underwent a workup which included CT chest which was largely unrevealing.  He was discharged with outpatient follow-up.  Since discharging from the emergency department yesterday he has developed persistent gross bloody sputum he presents today for further workup.   On arrival he was afebrile and hemodynamically stable.  Labs were obtained which revealed old WBC 10.5, hemoglobin 10.8 at baseline, creatinine 1.2, troponin 19, BNP 369.  Patient had intraoperative echocardiogram on 6/27.  He has not had imaging of his heart stents.  He had a chest x-ray today which showed small left pleural effusion.  CT angiogram pulmonary embolism study on visit to ER yesterday showed moderate-sized pericardial effusion with bilateral pleural effusions and cirrhosis of the liver.  Assessment & Plan:   Principal Problem:   Pericardial effusion Active Problems:   Essential hypertension   Hyperlipidemia LDL goal <70   Stage 3a chronic kidney disease (CKD) (HCC)   DM2 (diabetes mellitus, type 2) (HCC)   CAD (coronary artery disease)   S/P CABG x 5  Hemoptysis: Patient admitted with hemoptysis of 2 days duration.  Denies any shortness of breath.  Chest x-ray raising suspicion for possible left lower lobe infiltrate.  CTA negative for PE.  No leukocytosis and afebrile.  Checking procalcitonin, if elevated, will treat for pneumonia.  History of CAD status post CABG x 5 2 weeks ago: Wondering if hemoptysis is one of the complication of his recent surgery.  I have consulted cardiothoracic surgery.  Mild pericardial  effusion: Unsure whether this is expected.  Consulted cardiothoracic surgery.  Patient asymptomatic and cardiac tamponade ruled out.  Chest pain: Expected due to recent cardiothoracic surgery.  Troponin slightly elevated but flat, ACS ruled out.  CKD stage IIIa: At baseline.  Essential hypertension: Blood pressure controlled.  Continue home medications.  Hyperlipidemia: Continue home medications.  History of type 2 diabetes mellitus: Recent hemoglobin A1c however is only 5.8.  He takes metformin at home which is on hold.  Will start him on SSI.  DVT prophylaxis: enoxaparin (LOVENOX) injection 40 mg Start: 11/23/22 2145 SCDs Start: 11/23/22 2142   Code Status: Full Code  Family Communication:  None present at bedside.  Plan of care discussed with patient in length and he/she verbalized understanding and agreed with it.  Status is: Inpatient Remains inpatient appropriate because: Needs further evaluation by CT surgery and further workup.   Estimated body mass index is 23.06 kg/m as calculated from the following:   Height as of this encounter: 6' (1.829 m).   Weight as of this encounter: 77.1 kg.  Pressure Injury 11/13/22 Coccyx Medial Stage 1 -  Intact skin with non-blanchable redness of a localized area usually over a bony prominence. (Active)  11/13/22 1600  Location: Coccyx  Location Orientation: Medial  Staging: Stage 1 -  Intact skin with non-blanchable redness of a localized area usually over a bony prominence.  Wound Description (Comments):   Present on Admission:   Dressing Type Foam - Lift dressing to assess site every shift 11/21/22 0827   Nutritional Assessment: Body mass index is 23.06 kg/m.Marland Kitchen Seen  by dietician.  I agree with the assessment and plan as outlined below: Nutrition Status:        . Skin Assessment: I have examined the patient's skin and I agree with the wound assessment as performed by the wound care RN as outlined below: Pressure Injury 11/13/22  Coccyx Medial Stage 1 -  Intact skin with non-blanchable redness of a localized area usually over a bony prominence. (Active)  11/13/22 1600  Location: Coccyx  Location Orientation: Medial  Staging: Stage 1 -  Intact skin with non-blanchable redness of a localized area usually over a bony prominence.  Wound Description (Comments):   Present on Admission:   Dressing Type Foam - Lift dressing to assess site every shift 11/21/22 0827    Consultants:  CT surgery  Procedures:  None  Antimicrobials:  Anti-infectives (From admission, onward)    None         Subjective: Patient seen and examined.  He was complaining of some chest pain intermittently but no shortness of breath and no cough.  Has not had any hemoptysis episodes since presentation to the ED.  No other complaint.  Objective: Vitals:   11/24/22 0730 11/24/22 0915 11/24/22 1045 11/24/22 1100  BP: 137/73  (!) 140/67   Pulse: 76 66 68   Resp: (!) 25 18 20    Temp:  97.9 F (36.6 C)  98.1 F (36.7 C)  TempSrc:  Oral  Oral  SpO2: 96% 96% 95%   Weight:      Height:        Intake/Output Summary (Last 24 hours) at 11/24/2022 1146 Last data filed at 11/24/2022 0758 Gross per 24 hour  Intake --  Output 200 ml  Net -200 ml   Filed Weights   11/23/22 1637  Weight: 77.1 kg    Examination:  General exam: Appears calm and comfortable  Respiratory system: Clear to auscultation. Respiratory effort normal.  Has longitudinal surgical incision in the anterior sternum which is slightly tender to palpation. Cardiovascular system: S1 & S2 heard, RRR. No JVD, rubs, gallops or clicks. No pedal edema. Gastrointestinal system: Abdomen is nondistended, soft and nontender. No organomegaly or masses felt. Normal bowel sounds heard. Central nervous system: Alert and oriented. No focal neurological deficits. Extremities: Symmetric 5 x 5 power. Skin: No rashes, lesions or ulcers Psychiatry: Judgement and insight appear normal. Mood &  affect appropriate.    Data Reviewed: I have personally reviewed following labs and imaging studies  CBC: Recent Labs  Lab 11/18/22 0122 11/22/22 1230 11/23/22 1829 11/23/22 2214 11/24/22 0445  WBC 6.8 9.1 10.5 8.4 8.6  NEUTROABS  --  7.1 7.7  --   --   HGB 8.9* 11.0* 10.8* 10.6* 11.0*  HCT 27.0* 33.1* 32.9* 32.0* 33.2*  MCV 90.3 91.7 89.9 89.6 90.7  PLT 120* 295 305 287 289   Basic Metabolic Panel: Recent Labs  Lab 11/18/22 0122 11/20/22 0110 11/22/22 1230 11/23/22 1829 11/23/22 2214  NA 135 133* 134* 137  --   K 3.8 3.7 3.9 4.0  --   CL 103 103 103 100  --   CO2 24 23 23 25   --   GLUCOSE 111* 101* 119* 112*  --   BUN 22 15 14 13   --   CREATININE 1.41* 1.29* 1.26* 1.26* 1.24  CALCIUM 8.3* 8.4* 8.6* 9.2  --    GFR: Estimated Creatinine Clearance: 54.4 mL/min (by C-G formula based on SCr of 1.24 mg/dL). Liver Function Tests: Recent Labs  Lab  11/22/22 1230  AST 45*  ALT 27  ALKPHOS 116  BILITOT 1.4*  PROT 5.9*  ALBUMIN 3.2*   No results for input(s): "LIPASE", "AMYLASE" in the last 168 hours. No results for input(s): "AMMONIA" in the last 168 hours. Coagulation Profile: Recent Labs  Lab 11/22/22 1230  INR 1.3*   Cardiac Enzymes: No results for input(s): "CKTOTAL", "CKMB", "CKMBINDEX", "TROPONINI" in the last 168 hours. BNP (last 3 results) No results for input(s): "PROBNP" in the last 8760 hours. HbA1C: No results for input(s): "HGBA1C" in the last 72 hours. CBG: Recent Labs  Lab 11/20/22 1111 11/20/22 1638 11/20/22 2111 11/21/22 0559 11/21/22 1135  GLUCAP 109* 115* 127* 126* 134*   Lipid Profile: No results for input(s): "CHOL", "HDL", "LDLCALC", "TRIG", "CHOLHDL", "LDLDIRECT" in the last 72 hours. Thyroid Function Tests: No results for input(s): "TSH", "T4TOTAL", "FREET4", "T3FREE", "THYROIDAB" in the last 72 hours. Anemia Panel: No results for input(s): "VITAMINB12", "FOLATE", "FERRITIN", "TIBC", "IRON", "RETICCTPCT" in the last 72  hours. Sepsis Labs: No results for input(s): "PROCALCITON", "LATICACIDVEN" in the last 168 hours.  Recent Results (from the past 240 hour(s))  SARS Coronavirus 2 by RT PCR (hospital order, performed in [facility] hospital lab) *cepheid single result test* Anterior Nasal Swab     Status: None   Collection Time: 11/22/22  1:28 PM   Specimen: Anterior Nasal Swab  Result Value Ref Range Status   SARS Coronavirus 2 by RT PCR NEGATIVE NEGATIVE Final    Comment: Performed at [facility], [address], [place], [place] [address]     Radiology Studies: ECHOCARDIOGRAM COMPLETE  Result Date: 11/24/2022    ECHOCARDIOGRAM REPORT   Patient Name:   Collin Bridges Date of Exam: 11/24/2022 Medical Rec #:  [id number]             Height:       72.0 in Accession #:    [id number]            Weight:       170.0 lb Date of Birth:  [birth year]            BSA:          1.988 m Patient Age:    76 years              BP:           137/73 mmHg Patient Gender: M                     HR:           71 bpm. Exam Location:  Inpatient Procedure: 2D Echo, Cardiac Doppler and Color Doppler Indications:    Pericardial effusion I31.3  History:        Patient has prior history of Echocardiogram examinations, most                 recent 07/09/2022. CHF, Previous Myocardial Infarction and CAD,                 Prior CABG, Stroke, Signs/Symptoms:Chest Pain; Risk                 Factors:Hypertension, Diabetes and Dyslipidemia. CKD, stage 3.  Sonographer:    Lucendia Herrlich Referring Phys: [id number] ROBERT DORRELL IMPRESSIONS  1. Left ventricular ejection fraction, by estimation, is 60 to 65%. The left ventricle has normal function. The left ventricle has no regional wall motion abnormalities. Diastolic function indeterminant due to significant MAC.  2. Right ventricular systolic function is normal. The right ventricular size is normal. There is normal pulmonary artery systolic pressure. The estimated right ventricular systolic  pressure is 15.1 mmHg.  3. A small pericardial effusion is present. There is no evidence of cardiac tamponade. Moderate pleural effusion in the left lateral region.  4. The mitral valve is degenerative. Trivial mitral valve regurgitation. Moderate mitral annular calcification.  5. The aortic valve is tricuspid. There is moderate calcification of the aortic valve. There is moderate thickening of the aortic valve. Aortic valve regurgitation is not visualized. Mild aortic valve stenosis. Aortic valve mean gradient measures 10.4 mmHg. Aortic valve Vmax measures 2.40 m/s.  6. Aortic dilatation noted. There is borderline dilatation of the ascending aorta, measuring 38 mm.  7. The inferior vena cava is normal in size with greater than 50% respiratory variability, suggesting right atrial pressure of 3 mmHg. Comparison(s): No significant change from prior study. FINDINGS  Left Ventricle: Left ventricular ejection fraction, by estimation, is 60 to 65%. The left ventricle has normal function. The left ventricle has no regional wall motion abnormalities. The left ventricular internal cavity size was normal in size. There is  no left ventricular hypertrophy. Diastolic function indeterminant due to significant MAC. Right Ventricle: The right ventricular size is normal. No increase in right ventricular wall thickness. Right ventricular systolic function is normal. There is normal pulmonary artery systolic pressure. The tricuspid regurgitant velocity is 1.74 m/s, and  with an assumed right atrial pressure of 3 mmHg, the estimated right ventricular systolic pressure is 15.1 mmHg. Left Atrium: Left atrial size was normal in size. Right Atrium: Right atrial size was normal in size. Pericardium: A small pericardial effusion is present. There is no evidence of cardiac tamponade. Mitral Valve: The mitral valve is degenerative in appearance. There is moderate thickening of the mitral valve leaflet(s). There is moderate calcification of  the mitral valve leaflet(s). Moderate mitral annular calcification. Trivial mitral valve regurgitation. Tricuspid Valve: The tricuspid valve is normal in structure. Tricuspid valve regurgitation is trivial. Aortic Valve: The aortic valve is tricuspid. There is moderate calcification of the aortic valve. There is moderate thickening of the aortic valve. Aortic valve regurgitation is not visualized. Mild aortic stenosis is present. Aortic valve mean gradient measures 10.4 mmHg. Aortic valve peak gradient measures 23.0 mmHg. Aortic valve area, by VTI measures 1.08 cm. Pulmonic Valve: The pulmonic valve was grossly normal. Pulmonic valve regurgitation is trivial. Aorta: Aortic dilatation noted. There is borderline dilatation of the ascending aorta, measuring 38 mm. Venous: The inferior vena cava is normal in size with greater than 50% respiratory variability, suggesting right atrial pressure of 3 mmHg. IAS/Shunts: The atrial septum is grossly normal. Additional Comments: There is a moderate pleural effusion in the left lateral region.  LEFT VENTRICLE PLAX 2D LVIDd:         3.30 cm   Diastology LVIDs:         2.20 cm   LV e' medial:    8.58 cm/s LV PW:         1.10 cm   LV E/e' medial:  7.9 LV IVS:        1.10 cm   LV e' lateral:   6.96 cm/s LVOT diam:     1.80 cm   LV E/e' lateral: 9.8 LV SV:         47 LV SV Index:   24 LVOT Area:     2.54 cm  RIGHT VENTRICLE  IVC RV S prime:     8.27 cm/s  IVC diam: 1.10 cm TAPSE (M-mode): 1.3 cm LEFT ATRIUM             Index        RIGHT ATRIUM           Index LA diam:        3.70 cm 1.86 cm/m   RA Area:     14.60 cm LA Vol (A2C):   61.2 ml 30.78 ml/m  RA Volume:   34.00 ml  17.10 ml/m LA Vol (A4C):   50.8 ml 25.55 ml/m LA Biplane Vol: 60.7 ml 30.53 ml/m  AORTIC VALVE AV Area (Vmax):    0.97 cm AV Area (Vmean):   1.02 cm AV Area (VTI):     1.08 cm AV Vmax:           240.00 cm/s AV Vmean:          147.500 cm/s AV VTI:            0.436 m AV Peak Grad:      23.0  mmHg AV Mean Grad:      10.4 mmHg LVOT Vmax:         91.40 cm/s LVOT Vmean:        58.867 cm/s LVOT VTI:          0.186 m LVOT/AV VTI ratio: 0.43  AORTA Ao Root diam: 3.40 cm Ao Asc diam:  3.80 cm MITRAL VALVE                TRICUSPID VALVE MV Area (PHT): 2.37 cm     TR Peak grad:   12.1 mmHg MV Decel Time: 320 msec     TR Vmax:        174.00 cm/s MV E velocity: 68.10 cm/s MV A velocity: 108.00 cm/s  SHUNTS MV E/A ratio:  0.63         Systemic VTI:  0.19 m                             Systemic Diam: 1.80 cm Laurance Flatten MD Electronically signed by Laurance Flatten MD Signature Date/Time: 11/24/2022/10:27:28 AM    Final    DG Chest 2 View  Result Date: 11/23/2022 CLINICAL DATA:  Hemoptysis EXAM: CHEST - 2 VIEW COMPARISON:  11/22/2022 FINDINGS: Prior CABG. Heart and mediastinal contours within normal limits. Small left pleural effusion with left lower lobe atelectasis or infiltrate. Right lung clear. No acute bony abnormality. IMPRESSION: Small left pleural effusion with left lower lobe atelectasis or infiltrate. Electronically Signed   By: Charlett Nose M.D.   On: 11/23/2022 17:28   CT Angio Chest PE W and/or Wo Contrast  Result Date: 11/22/2022 CLINICAL DATA:  Pulmonary embolism suspected. High probability. Cough with some hemoptysis. EXAM: CT ANGIOGRAPHY CHEST WITH CONTRAST TECHNIQUE: Multidetector CT imaging of the chest was performed using the standard protocol during bolus administration of intravenous contrast. Multiplanar CT image reconstructions and MIPs were obtained to evaluate the vascular anatomy. RADIATION DOSE REDUCTION: This exam was performed according to the departmental dose-optimization program which includes automated exposure control, adjustment of the mA and/or kV according to patient size and/or use of iterative reconstruction technique. CONTRAST:  75mL OMNIPAQUE IOHEXOL 350 MG/ML SOLN COMPARISON:  Radiography same day.  Previous chest CT 07/11/2022. FINDINGS: Cardiovascular: Heart  size upper limits of normal. Moderate size pericardial effusion is present. There is extensive  coronary artery calcification. There is aortic atherosclerotic calcification. Pulmonary arterial opacification is good. No pulmonary emboli are seen. Mediastinum/Nodes: No mass or adenopathy. Lungs/Pleura: Bilateral pleural effusions layering dependently, larger on the left than the right. Mild dependent atelectasis in the right lower lobe. Subtotal collapse of the left lower lobe. Previously seen left lower lobe nodule cannot be evaluated given the pulmonary collapse. Upper Abdomen: Pronounced cirrhosis of the liver. No acute upper abdominal finding. Musculoskeletal: No significant finding. Review of the MIP images confirms the above findings. IMPRESSION: 1. No pulmonary emboli. 2. Moderate size pericardial effusion. 3. Extensive coronary artery calcification. 4. Bilateral pleural effusions layering dependently, larger on the left than the right. Subtotal collapse of the left lower lobe. 5. Cirrhosis of the liver. Aortic Atherosclerosis (ICD10-I70.0). Electronically Signed   By: Paulina Fusi M.D.   On: 11/22/2022 14:52   DG Chest Portable 1 View  Result Date: 11/22/2022 CLINICAL DATA:  Cough EXAM: PORTABLE CHEST 1 VIEW COMPARISON:  11/17/2022 FINDINGS: Previous median sternotomy and CABG. The right lung is clear except for mild atelectasis at the base. On the left, there is more pronounced density in the left lower chest that could be due to a combination of left lower lobe collapse/infiltrate and pleural fluid. IMPRESSION: Abnormal density in the left lower chest that could be due to a combination of left lower lobe collapse/infiltrate and pleural fluid. Electronically Signed   By: Paulina Fusi M.D.   On: 11/22/2022 14:47    Scheduled Meds:  amiodarone  200 mg Oral BID   aspirin EC  325 mg Oral Daily   atorvastatin  80 mg Oral Daily   enoxaparin (LOVENOX) injection  40 mg Subcutaneous Q24H   fenofibrate  160 mg  Oral Daily   gabapentin  300 mg Oral QHS   hydrALAZINE  25 mg Oral Q8H   losartan  100 mg Oral Daily   metoprolol tartrate  25 mg Oral BID   pantoprazole  40 mg Oral Daily   PARoxetine  40 mg Oral [id number]   Continuous Infusions:   LOS: 1 day   Hughie Closs, MD [facility]  11/24/2022, 11:46 AM   *Please note that this is a verbal dictation therefore any spelling or grammatical errors are due to the "[employer]agon [employer] One" system interpretation.  Please page via Amion and do not message via secure chat for urgent patient care matters. Secure chat can be used for non urgent patient care matters.  How to contact the [facility] Attending or Consulting provider 7A - 7P or covering provider during after hours 7P -7A, for this patient?  Check the care team in [facility] and look for a) attending/consulting [facility] provider listed and b) the [facility] team listed. Page or secure chat 7A-7P. Log into [email or web address] and use [place] universal password to access. If you do not have the password, please contact the hospital operator. Locate the [facility] provider you are looking for under [facility] and page to a number that you can be directly reached. If you still have difficulty reaching the provider, please page the [facility] (Director on Call) for the Hospitalists listed on amion for assistance.

## 2022-11-25 DIAGNOSIS — I3139 Other pericardial effusion (noninflammatory): Secondary | ICD-10-CM | POA: Diagnosis not present

## 2022-11-25 LAB — CBC WITH DIFFERENTIAL/PLATELET
Abs Immature Granulocytes: 0.04 10*3/uL (ref 0.00–0.07)
Basophils Absolute: 0.1 10*3/uL (ref 0.0–0.1)
Basophils Relative: 1 %
Eosinophils Absolute: 0.5 10*3/uL (ref 0.0–0.5)
Eosinophils Relative: 7 %
HCT: 33 % — ABNORMAL LOW (ref 39.0–52.0)
Hemoglobin: 11.1 g/dL — ABNORMAL LOW (ref 13.0–17.0)
Immature Granulocytes: 1 %
Lymphocytes Relative: 18 %
Lymphs Abs: 1.5 10*3/uL (ref 0.7–4.0)
MCH: 30.3 pg (ref 26.0–34.0)
MCHC: 33.6 g/dL (ref 30.0–36.0)
MCV: 90.2 fL (ref 80.0–100.0)
Monocytes Absolute: 0.7 10*3/uL (ref 0.1–1.0)
Monocytes Relative: 9 %
Neutro Abs: 5.2 10*3/uL (ref 1.7–7.7)
Neutrophils Relative %: 64 %
Platelets: 284 10*3/uL (ref 150–400)
RBC: 3.66 MIL/uL — ABNORMAL LOW (ref 4.22–5.81)
RDW: 15.7 % — ABNORMAL HIGH (ref 11.5–15.5)
WBC: 8 10*3/uL (ref 4.0–10.5)
nRBC: 0 % (ref 0.0–0.2)

## 2022-11-25 LAB — BASIC METABOLIC PANEL
Anion gap: 13 (ref 5–15)
BUN: 15 mg/dL (ref 8–23)
CO2: 24 mmol/L (ref 22–32)
Calcium: 9.1 mg/dL (ref 8.9–10.3)
Chloride: 100 mmol/L (ref 98–111)
Creatinine, Ser: 1.3 mg/dL — ABNORMAL HIGH (ref 0.61–1.24)
GFR, Estimated: 57 mL/min — ABNORMAL LOW (ref >60)
Glucose, Bld: 114 mg/dL — ABNORMAL HIGH (ref 70–99)
Potassium: 3.7 mmol/L (ref 3.5–5.1)
Sodium: 137 mmol/L (ref 135–145)

## 2022-11-25 LAB — GLUCOSE, CAPILLARY
Glucose-Capillary: 129 mg/dL — ABNORMAL HIGH (ref 70–99)
Glucose-Capillary: 140 mg/dL — ABNORMAL HIGH (ref 70–99)
Glucose-Capillary: 139 mg/dL — ABNORMAL HIGH (ref 70–99)
Glucose-Capillary: 143 mg/dL — ABNORMAL HIGH (ref 70–99)

## 2022-11-25 NOTE — Care Management Important Message (Signed)
Important Message  Patient Details  Name: Collin Bridges MRN: [id number] Date of Birth: July 07, 1945   Medicare Important Message Given:  Yes     [name] 11/25/2022, 11:49 AM

## 2022-11-25 NOTE — Progress Notes (Signed)
PROGRESS NOTE    [name]  ZOX:[id number] DOB: 09-02-1945 DOA: 11/23/2022 PCP: Corwin Levins, MD   Brief Narrative:  HPI: Collin Bridges is a 77 y.o. male with medical history significant of CAD status post recent CABG, CHF, type 2 diabetes, GERD, hypertension, prior stroke who presented to the emergency department due to 2 days of hemoptysis.  He was seen in the ER yesterday and underwent a workup which included CT chest which was largely unrevealing.  He was discharged with outpatient follow-up.  Since discharging from the emergency department yesterday he has developed persistent gross bloody sputum he presents today for further workup.   On arrival he was afebrile and hemodynamically stable.  Labs were obtained which revealed old WBC 10.5, hemoglobin 10.8 at baseline, creatinine 1.2, troponin 19, BNP 369.  Patient had intraoperative echocardiogram on 6/27.  He has not had imaging of his heart stents.  He had a chest x-ray today which showed small left pleural effusion.  CT angiogram pulmonary embolism study on visit to ER yesterday showed moderate-sized pericardial effusion with bilateral pleural effusions and cirrhosis of the liver.  Assessment & Plan:   Principal Problem:   Pericardial effusion Active Problems:   Essential hypertension   Hyperlipidemia LDL goal <70   Stage 3a chronic kidney disease (CKD) (HCC)   DM2 (diabetes mellitus, type 2) (HCC)   CAD (coronary artery disease)   S/P CABG x 5  Hemoptysis: Patient admitted with hemoptysis of 2 days duration.  Denies any shortness of breath.  Chest x-ray raising suspicion for possible left lower lobe infiltrate.  CTA negative for PE.  No leukocytosis and afebrile.  Procalcitonin unremarkable.  No more hemoptysis since admission.  Doubt pneumonia.  Does not need antibiotics.  Patient medically stable and can be discharged back to SNF however still needs to be seen by PT OT and still needs insurance authorization  which per [facility] will likely not be obtained until tomorrow morning.  History of CAD status post CABG x 5 2 weeks ago: Seen by CT surgery, patient appears to be stable from their standpoint.  Mild pericardial effusion: Seen by CT surgery, this is expected per CTS.  Chest pain: No more chest pain.  Slight elevated troponin but flat, ACS ruled out.  CKD stage IIIa: At baseline.  Essential hypertension: Blood pressure controlled.  Continue home medications.  Hyperlipidemia: Continue home medications.  History of type 2 diabetes mellitus: Recent hemoglobin A1c however is only 5.8.  He takes metformin at home which is on hold.  Will start him on SSI.  DVT prophylaxis: enoxaparin (LOVENOX) injection 40 mg Start: 11/23/22 2145 SCDs Start: 11/23/22 2142   Code Status: Full Code  Family Communication:  None present at bedside.  Plan of care discussed with patient in length and he/she verbalized understanding and agreed with it.  Status is: Inpatient Remains inpatient appropriate because: Needs further evaluation by CT surgery and further workup.   Estimated body mass index is 23.06 kg/m as calculated from the following:   Height as of this encounter: 6' (1.829 m).   Weight as of this encounter: 77.1 kg.     Nutritional Assessment: Body mass index is 23.06 kg/m.Marland Kitchen Seen by dietician.  I agree with the assessment and plan as outlined below: Nutrition Status:   Skin Assessment: I have examined the patient's skin and I agree with the wound assessment as performed by the wound care RN as outlined below:   Consultants:  CT surgery  Procedures:  None  Antimicrobials:  Anti-infectives (From admission, onward)    None         Subjective: Patient seen and examined.  He feels better.  No more chest pain or hemoptysis.  He is comfortable returning back to SNF.  Objective: Vitals:   11/24/22 2319 11/25/22 0400 11/25/22 0640 11/25/22 0718  BP: (!) 152/62 (!) 160/85 (!) 155/93 (!)  149/76  Pulse: 80 69 72 77  Resp: 12 13 16 18   Temp: 97.9 F (36.6 C) (!) 97.1 F (36.2 C)  98 F (36.7 C)  TempSrc: Axillary Axillary  Oral  SpO2: 94% 95% 94% 95%  Weight:      Height:        Intake/Output Summary (Last 24 hours) at 11/25/2022 1325 Last data filed at 11/25/2022 0939 Gross per 24 hour  Intake 120 ml  Output 1450 ml  Net -1330 ml   Filed Weights   11/23/22 1637  Weight: 77.1 kg    Examination:  General exam: Appears calm and comfortable  Respiratory system: Clear to auscultation. Respiratory effort normal. Cardiovascular system: S1 & S2 heard, RRR. No JVD, murmurs, rubs, gallops or clicks. No pedal edema. Gastrointestinal system: Abdomen is nondistended, soft and nontender. No organomegaly or masses felt. Normal bowel sounds heard. Central nervous system: Alert and oriented. No focal neurological deficits. Extremities: Symmetric 5 x 5 power. Skin: No rashes, lesions or ulcers.  Psychiatry: Judgement and insight appear normal. Mood & affect appropriate.   Data Reviewed: I have personally reviewed following labs and imaging studies  CBC: Recent Labs  Lab 11/22/22 1230 11/23/22 1829 11/23/22 2214 11/24/22 0445 11/25/22 0309  WBC 9.1 10.5 8.4 8.6 8.0  NEUTROABS 7.1 7.7  --   --  5.2  HGB 11.0* 10.8* 10.6* 11.0* 11.1*  HCT 33.1* 32.9* 32.0* 33.2* 33.0*  MCV 91.7 89.9 89.6 90.7 90.2  PLT 295 305 287 289 284   Basic Metabolic Panel: Recent Labs  Lab 11/20/22 0110 11/22/22 1230 11/23/22 1829 11/23/22 2214 11/25/22 0309  NA 133* 134* 137  --  137  K 3.7 3.9 4.0  --  3.7  CL 103 103 100  --  100  CO2 23 23 25   --  24  GLUCOSE 101* 119* 112*  --  114*  BUN 15 14 13   --  15  CREATININE 1.29* 1.26* 1.26* 1.24 1.30*  CALCIUM 8.4* 8.6* 9.2  --  9.1   GFR: Estimated Creatinine Clearance: 51.9 mL/min (A) (by C-G formula based on SCr of 1.3 mg/dL (H)). Liver Function Tests: Recent Labs  Lab 11/22/22 1230  AST 45*  ALT 27  ALKPHOS 116  BILITOT  1.4*  PROT 5.9*  ALBUMIN 3.2*   No results for input(s): "LIPASE", "AMYLASE" in the last 168 hours. No results for input(s): "AMMONIA" in the last 168 hours. Coagulation Profile: Recent Labs  Lab 11/22/22 1230  INR 1.3*   Cardiac Enzymes: No results for input(s): "CKTOTAL", "CKMB", "CKMBINDEX", "TROPONINI" in the last 168 hours. BNP (last 3 results) No results for input(s): "PROBNP" in the last 8760 hours. HbA1C: No results for input(s): "HGBA1C" in the last 72 hours. CBG: Recent Labs  Lab 11/24/22 1258 11/24/22 1536 11/24/22 2110 11/25/22 0715 11/25/22 1211  GLUCAP 107* 138* 126* 129* 140*   Lipid Profile: No results for input(s): "CHOL", "HDL", "LDLCALC", "TRIG", "CHOLHDL", "LDLDIRECT" in the last 72 hours. Thyroid Function Tests: No results for input(s): "TSH", "T4TOTAL", "FREET4", "T3FREE", "THYROIDAB" in the last 72 hours. Anemia  Panel: No results for input(s): "VITAMINB12", "FOLATE", "FERRITIN", "TIBC", "IRON", "RETICCTPCT" in the last 72 hours. Sepsis Labs: Recent Labs  Lab 11/24/22 1055  PROCALCITON <0.10    Recent Results (from the past 240 hour(s))  SARS Coronavirus 2 by RT PCR (hospital order, performed in [facility] hospital lab) *cepheid single result test* Anterior Nasal Swab     Status: None   Collection Time: 11/22/22  1:28 PM   Specimen: Anterior Nasal Swab  Result Value Ref Range Status   SARS Coronavirus 2 by RT PCR NEGATIVE NEGATIVE Final    Comment: Performed at [facility], [address] [place], [place] [address]     Radiology Studies: ECHOCARDIOGRAM COMPLETE  Result Date: 11/24/2022    ECHOCARDIOGRAM REPORT   Patient Name:   Collin Bridges Date of Exam: 11/24/2022 Medical Rec #:  [id number]             Height:       72.0 in Accession #:    [id number]            Weight:       170.0 lb Date of Birth:  1945-10-08            BSA:          1.988 m Patient Age:    76 years              BP:           137/73 mmHg Patient Gender: M                      HR:           71 bpm. Exam Location:  Inpatient Procedure: 2D Echo, Cardiac Doppler and Color Doppler Indications:    Pericardial effusion I31.3  History:        Patient has prior history of Echocardiogram examinations, most                 recent 07/09/2022. CHF, Previous Myocardial Infarction and CAD,                 Prior CABG, Stroke, Signs/Symptoms:Chest Pain; Risk                 Factors:Hypertension, Diabetes and Dyslipidemia. CKD, stage 3.  Sonographer:    Lucendia Herrlich Referring Phys: [id number] ROBERT DORRELL IMPRESSIONS  1. Left ventricular ejection fraction, by estimation, is 60 to 65%. The left ventricle has normal function. The left ventricle has no regional wall motion abnormalities. Diastolic function indeterminant due to significant MAC.  2. Right ventricular systolic function is normal. The right ventricular size is normal. There is normal pulmonary artery systolic pressure. The estimated right ventricular systolic pressure is 15.1 mmHg.  3. A small pericardial effusion is present. There is no evidence of cardiac tamponade. Moderate pleural effusion in the left lateral region.  4. The mitral valve is degenerative. Trivial mitral valve regurgitation. Moderate mitral annular calcification.  5. The aortic valve is tricuspid. There is moderate calcification of the aortic valve. There is moderate thickening of the aortic valve. Aortic valve regurgitation is not visualized. Mild aortic valve stenosis. Aortic valve mean gradient measures 10.4 mmHg. Aortic valve Vmax measures 2.40 m/s.  6. Aortic dilatation noted. There is borderline dilatation of the ascending aorta, measuring 38 mm.  7. The inferior vena cava is normal in size with greater than 50% respiratory variability, suggesting right atrial pressure of 3 mmHg. Comparison(s): No  significant change from prior study. FINDINGS  Left Ventricle: Left ventricular ejection fraction, by estimation, is 60 to 65%. The left ventricle has  normal function. The left ventricle has no regional wall motion abnormalities. The left ventricular internal cavity size was normal in size. There is  no left ventricular hypertrophy. Diastolic function indeterminant due to significant MAC. Right Ventricle: The right ventricular size is normal. No increase in right ventricular wall thickness. Right ventricular systolic function is normal. There is normal pulmonary artery systolic pressure. The tricuspid regurgitant velocity is 1.74 m/s, and  with an assumed right atrial pressure of 3 mmHg, the estimated right ventricular systolic pressure is 15.1 mmHg. Left Atrium: Left atrial size was normal in size. Right Atrium: Right atrial size was normal in size. Pericardium: A small pericardial effusion is present. There is no evidence of cardiac tamponade. Mitral Valve: The mitral valve is degenerative in appearance. There is moderate thickening of the mitral valve leaflet(s). There is moderate calcification of the mitral valve leaflet(s). Moderate mitral annular calcification. Trivial mitral valve regurgitation. Tricuspid Valve: The tricuspid valve is normal in structure. Tricuspid valve regurgitation is trivial. Aortic Valve: The aortic valve is tricuspid. There is moderate calcification of the aortic valve. There is moderate thickening of the aortic valve. Aortic valve regurgitation is not visualized. Mild aortic stenosis is present. Aortic valve mean gradient measures 10.4 mmHg. Aortic valve peak gradient measures 23.0 mmHg. Aortic valve area, by VTI measures 1.08 cm. Pulmonic Valve: The pulmonic valve was grossly normal. Pulmonic valve regurgitation is trivial. Aorta: Aortic dilatation noted. There is borderline dilatation of the ascending aorta, measuring 38 mm. Venous: The inferior vena cava is normal in size with greater than 50% respiratory variability, suggesting right atrial pressure of 3 mmHg. IAS/Shunts: The atrial septum is grossly normal. Additional Comments:  There is a moderate pleural effusion in the left lateral region.  LEFT VENTRICLE PLAX 2D LVIDd:         3.30 cm   Diastology LVIDs:         2.20 cm   LV e' medial:    8.58 cm/s LV PW:         1.10 cm   LV E/e' medial:  7.9 LV IVS:        1.10 cm   LV e' lateral:   6.96 cm/s LVOT diam:     1.80 cm   LV E/e' lateral: 9.8 LV SV:         47 LV SV Index:   24 LVOT Area:     2.54 cm  RIGHT VENTRICLE            IVC RV S prime:     8.27 cm/s  IVC diam: 1.10 cm TAPSE (M-mode): 1.3 cm LEFT ATRIUM             Index        RIGHT ATRIUM           Index LA diam:        3.70 cm 1.86 cm/m   RA Area:     14.60 cm LA Vol (A2C):   61.2 ml 30.78 ml/m  RA Volume:   34.00 ml  17.10 ml/m LA Vol (A4C):   50.8 ml 25.55 ml/m LA Biplane Vol: 60.7 ml 30.53 ml/m  AORTIC VALVE AV Area (Vmax):    0.97 cm AV Area (Vmean):   1.02 cm AV Area (VTI):     1.08 cm AV Vmax:  240.00 cm/s AV Vmean:          147.500 cm/s AV VTI:            0.436 m AV Peak Grad:      23.0 mmHg AV Mean Grad:      10.4 mmHg LVOT Vmax:         91.40 cm/s LVOT Vmean:        58.867 cm/s LVOT VTI:          0.186 m LVOT/AV VTI ratio: 0.43  AORTA Ao Root diam: 3.40 cm Ao Asc diam:  3.80 cm MITRAL VALVE                TRICUSPID VALVE MV Area (PHT): 2.37 cm     TR Peak grad:   12.1 mmHg MV Decel Time: 320 msec     TR Vmax:        174.00 cm/s MV E velocity: 68.10 cm/s MV A velocity: 108.00 cm/s  SHUNTS MV E/A ratio:  0.63         Systemic VTI:  0.19 m                             Systemic Diam: 1.80 cm Laurance Flatten MD Electronically signed by Laurance Flatten MD Signature Date/Time: 11/24/2022/10:27:28 AM    Final    DG Chest 2 View  Result Date: 11/23/2022 CLINICAL DATA:  Hemoptysis EXAM: CHEST - 2 VIEW COMPARISON:  11/22/2022 FINDINGS: Prior CABG. Heart and mediastinal contours within normal limits. Small left pleural effusion with left lower lobe atelectasis or infiltrate. Right lung clear. No acute bony abnormality. IMPRESSION: Small left pleural  effusion with left lower lobe atelectasis or infiltrate. Electronically Signed   By: Charlett Nose M.D.   On: 11/23/2022 17:28    Scheduled Meds:  amiodarone  200 mg Oral BID   aspirin EC  325 mg Oral Daily   atorvastatin  80 mg Oral Daily   enoxaparin (LOVENOX) injection  40 mg Subcutaneous Q24H   fenofibrate  160 mg Oral Daily   gabapentin  300 mg Oral QHS   hydrALAZINE  25 mg Oral Q8H   insulin aspart  0-5 Units Subcutaneous QHS   insulin aspart  0-9 Units Subcutaneous TID WC   losartan  100 mg Oral Daily   metoprolol tartrate  25 mg Oral BID   pantoprazole  40 mg Oral Daily   PARoxetine  40 mg Oral BH-q7a   Continuous Infusions:   LOS: 2 days   Hughie Closs, MD [facility]  11/25/2022, 1:25 PM   *Please note that this is a verbal dictation therefore any spelling or grammatical errors are due to the "Dragon Medical One" system interpretation.  Please page via Amion and do not message via secure chat for urgent patient care matters. Secure chat can be used for non urgent patient care matters.  How to contact the [facility] Attending or Consulting provider 7A - 7P or covering provider during after hours 7P -7A, for this patient?  Check the care team in [facility] and look for a) attending/consulting TRH provider listed and b) the [facility] team listed. Page or secure chat 7A-7P. Log into [email or web address] and use [place]'s universal password to access. If you do not have the password, please contact the hospital operator. Locate the [facility] provider you are looking for under [facility] and page to a number that you can be directly reached. If you  still have difficulty reaching the provider, please page the [facility] (Director on Call) for the Hospitalists listed on amion for assistance.

## 2022-11-25 NOTE — [place] FL2 (Signed)
[place] MEDICAID FL2 LEVEL OF CARE FORM     IDENTIFICATION  Patient Name: Collin Bridges Birthdate: 09-04-1945 Sex: male Admission Date (Current Location): 11/23/2022  [facility] and [place] Number:  Producer, television/film/video and Address:  [facility] [place]. [facility], [address], [place], [place] [address]      Provider Number: [id number]  Attending Physician Name and Address:  Hughie Closs, MD  Relative Name and Phone Number:  Dwana Curd (Significant other) [phone]    Current Level of Care: Hospital Recommended Level of Care: Skilled Nursing Facility Prior Approval Number:    Date Approved/Denied:   PASRR Number: [id number]  Discharge Plan: SNF    Current Diagnoses: Patient Active Problem List   Diagnosis Date Noted   Pericardial effusion 11/23/2022   S/P CABG x 5 11/21/2022   Pressure injury of skin 11/21/2022   Coronary artery disease 11/10/2022   CAD (coronary artery disease) 10/31/2022   Aortic atherosclerosis (HCC) 10/05/2022   NSTEMI (non-ST elevated myocardial infarction) (HCC) 07/12/2022   Abnormal cardiac CT angiography 07/12/2022   Severe sepsis (HCC) 07/09/2022   Chest pain 07/09/2022   Elevated troponin 07/09/2022   Depression 07/09/2022   Allergic rhinitis 07/09/2022   Chronic diastolic CHF (congestive heart failure) (HCC) 07/09/2022   CAP (community acquired pneumonia) 07/08/2022   Left leg cellulitis 06/03/2022   Cat scratch of left lower leg 05/18/2022   Skin infection 05/18/2022   DM2 (diabetes mellitus, type 2) (HCC) 12/01/2021   RLS (restless legs syndrome) 12/01/2021   Vitamin D deficiency 06/03/2021   History of stroke 04/25/2021   CVA (cerebral vascular accident) (HCC) 04/07/2021   Brain mass 03/17/2021   Headache 03/17/2021   Cerebral atrophy (HCC) 03/17/2021   Stroke of right basal ganglia (HCC) 03/17/2021   Forehead pain 03/17/2021   Increased prostate specific antigen (PSA) velocity 12/14/2020   Stage 3a chronic  kidney disease (CKD) (HCC) 12/14/2020   Hyperlipidemia LDL goal <70 08/09/2017   Encounter for well adult exam with abnormal findings 07/29/2016   Mild aortic stenosis 07/29/2016   Insomnia 07/29/2016   Erectile dysfunction 03/30/2016   Anxiety disorder 01/06/2016   Benign prostatic hyperplasia with urinary frequency 01/06/2016   Essential hypertension 02/02/2012   Hypoglycemia 1945/06/12    Orientation RESPIRATION BLADDER Height & Weight     Self, Time, Situation, Place  Normal Continent Weight: 170 lb (77.1 kg) Height:  6' (182.9 cm)  BEHAVIORAL SYMPTOMS/MOOD NEUROLOGICAL BOWEL NUTRITION STATUS      Continent Diet (Please see discharge summary)  AMBULATORY STATUS COMMUNICATION OF NEEDS Skin   Limited Assist Verbally Other (Comment) (dry,Abrasion,Leg,Bil.,Ecchymosis,Groin,Leg,Bil.,R,Erythema,sarum,Bil.,Wound/Incision LDAs)                       Personal Care Assistance Level of Assistance  Bathing, Feeding, Dressing Bathing Assistance: Limited assistance Feeding assistance: Independent       Functional Limitations Info  Sight, Hearing, Speech Sight Info: Adequate Hearing Info: Adequate Speech Info: Adequate    SPECIAL CARE FACTORS FREQUENCY  PT (By licensed PT), OT (By licensed OT)     PT Frequency: 5x min weekly OT Frequency: 5x min weekly            Contractures Contractures Info: Not present    Additional Factors Info  Code Status, Allergies, Insulin Sliding Scale, Psychotropic Code Status Info: FULL Allergies Info: NKA Psychotropic Info: PARoxetine (PAXIL) tablet 40 mg-BH-eah morning Insulin Sliding Scale Info: insulin aspart (novoLOG) injection 0-5 Units daily at bedtime,insulin aspart (  novoLOG) injection 0-9 Units 3 times daily with meals       Current Medications (11/25/2022):  This is the current hospital active medication list Current Facility-Administered Medications  Medication Dose Route Frequency Provider Last Rate Last Admin    acetaminophen (TYLENOL) tablet 650 mg  650 mg Oral Q6H PRN Dorrell, Robert, MD       Or   acetaminophen (TYLENOL) suppository 650 mg  650 mg Rectal Q6H PRN Dorrell, Robert, MD       amiodarone (PACERONE) tablet 200 mg  200 mg Oral BID Alan Mulder, MD   200 mg at 11/25/22 0910   aspirin EC tablet 325 mg  325 mg Oral Daily Alan Mulder, MD   325 mg at 11/25/22 0910   atorvastatin (LIPITOR) tablet 80 mg  80 mg Oral Daily Alan Mulder, MD   80 mg at 11/25/22 0910   enoxaparin (LOVENOX) injection 40 mg  40 mg Subcutaneous Q24H Alan Mulder, MD   40 mg at 11/24/22 2015   fenofibrate tablet 160 mg  160 mg Oral Daily Alan Mulder, MD   160 mg at 11/25/22 0910   gabapentin (NEURONTIN) capsule 300 mg  300 mg Oral QHS Alan Mulder, MD   300 mg at 11/24/22 2015   hydrALAZINE (APRESOLINE) tablet 25 mg  25 mg Oral Q8H Dorrell, Molly Maduro, MD   25 mg at 11/25/22 0550   insulin aspart (novoLOG) injection 0-5 Units  0-5 Units Subcutaneous QHS Pahwani, Ravi, MD       insulin aspart (novoLOG) injection 0-9 Units  0-9 Units Subcutaneous TID WC Hughie Closs, MD   1 Units at 11/25/22 1216   losartan (COZAAR) tablet 100 mg  100 mg Oral Daily Alan Mulder, MD   100 mg at 11/25/22 0910   metoprolol tartrate (LOPRESSOR) tablet 25 mg  25 mg Oral BID Alan Mulder, MD   25 mg at 11/25/22 0910   Oral care mouth rinse  15 mL Mouth Rinse PRN Dorrell, Robert, MD       oxyCODONE (Oxy IR/ROXICODONE) immediate release tablet 5 mg  5 mg Oral Q4H PRN Alan Mulder, MD   5 mg at 11/25/22 0551   pantoprazole (PROTONIX) EC tablet 40 mg  40 mg Oral Daily Alan Mulder, MD   40 mg at 11/25/22 0910   PARoxetine (PAXIL) tablet 40 mg  40 mg Oral Warren Lacy, MD   40 mg at 11/25/22 0817   zolpidem (AMBIEN) tablet 5 mg  5 mg Oral QHS PRN Howerter, Justin B, DO   5 mg at 11/24/22 2105     Discharge Medications: Please see discharge summary for a list of discharge medications.  Relevant Imaging  Results:  Relevant Lab Results:   Additional Information SSN-[id number]  Delilah Shan, LCSWA

## 2022-11-25 NOTE — ED Provider Notes (Incomplete)
[place] EMERGENCY DEPARTMENT AT [facility] Provider Note   CSN: [id number] Arrival date & time: 11/22/22  1133     History  Chief Complaint  Patient presents with  . Sore Throat  . Coughing Up Blood    Collin Bridges is a 77 y.o. male.   Sore Throat      77 year old male with medical history significant for recent CABG 1.5 weeks ago who presents to the emergency department from his rehab facility with a complaint of sore throat, nosebleed and coughing up blood since last night.  He endorses some discomfort in his throat 2 out of 10 in severity.  He states that he coughed up a blood clot earlier this morning that was moderate in size.  He has no further episodes of hemoptysis since being here in the emergency department. Home Medications Prior to Admission medications   Medication Sig Start Date End Date Taking? Authorizing Provider  amiodarone (PACERONE) 200 MG tablet Take 1 tablet (200 mg total) by mouth 2 (two) times daily. X 7 days, then decrease to 200 mg daily 11/19/22   Barrett, Rae Roam, PA-C  aspirin EC 325 MG tablet Take 1 tablet (325 mg total) by mouth daily. 11/19/22   Barrett, Erin R, PA-C  atorvastatin (LIPITOR) 80 MG tablet Take 1 tablet (80 mg total) by mouth daily. 07/14/22 11/10/22  Narda Bonds, MD  Carboxymethylcellulose Sodium (THERATEARS OP) Place 1 drop into both eyes daily as needed (dry eyes).    [provider]  cholecalciferol (VITAMIN D3) 25 MCG (1000 UNIT) tablet Take 1 tablet (1,000 Units total) by mouth daily. Take 1,000 Units by mouth daily. 11/22/21   Corwin Levins, MD  Evolocumab (REPATHA SURECLICK) 140 MG/ML SOAJ Inject 140 mg into the skin every 14 days. 10/06/22   O'NealRonnald Ramp, MD  fenofibrate 160 MG tablet Take 1 tablet (160 mg total) by mouth daily. 11/09/22   Corwin Levins, MD  fluticasone (FLONASE) 50 MCG/ACT nasal spray Place 2 sprays into both nostrils daily. 11/09/22   Corwin Levins, MD  gabapentin (NEURONTIN)  300 MG capsule Take 1 capsule (300 mg total) by mouth at bedtime. 08/08/22   Plotnikov, Georgina Quint, MD  hydrALAZINE (APRESOLINE) 25 MG tablet Take 1 tablet (25 mg total) by mouth every 8 (eight) hours. 10/11/22 01/09/23  Corwin Levins, MD  losartan (COZAAR) 100 MG tablet Take 1 tablet (100 mg total) by mouth daily. 11/09/22   Corwin Levins, MD  Magnesium 500 MG CAPS Take 500 mg by mouth daily.    [provider]  metFORMIN (GLUCOPHAGE-XR) 500 MG 24 hr tablet Take 1 tablet (500 mg) by mouth daily with breakfast. 11/09/22   Corwin Levins, MD  metoprolol tartrate (LOPRESSOR) 25 MG tablet Take 1 tablet (25 mg total) by mouth 2 (two) times daily. 11/19/22 03/19/23  Barrett, Erin R, PA-C  Multiple Vitamins-Minerals (HAIR/SKIN/NAILS/BIOTIN PO) Take 2 tablets by mouth daily.    [provider]  nitroGLYCERIN (NITROSTAT) 0.4 MG SL tablet Place 1 tablet under the tongue every 5 minutes as needed for chest pain. 10/06/22 01/04/23  Sande Rives, MD  PARoxetine (PAXIL) 40 MG tablet Take 1 tablet (40 mg total) by mouth every morning. 11/09/22   Corwin Levins, MD  traMADol (ULTRAM) 50 MG tablet Take 1 tablet (50 mg total) by mouth every 6 (six) hours as needed for moderate pain. 11/19/22   Barrett, Erin R, PA-C  vitamin E 180 MG (  400 UNITS) capsule Take 400 Units by mouth daily.    [provider]  zolpidem (AMBIEN) 10 MG tablet TAKE 1 TABLET(10 MG) BY MOUTH AT BEDTIME AS NEEDED FOR SLEEP Patient taking differently: Take 5-10 mg by mouth at bedtime. 09/22/22   Corwin Levins, MD      Allergies    Patient has no known allergies.    Review of Systems   Review of Systems  All other systems reviewed and are negative.   Physical Exam Updated Vital Signs BP (!) 151/81 (BP Location: Left Arm)   Pulse 68   Temp (!) 97.3 F (36.3 C) (Oral)   Resp 18   Ht 5\' 11"  (1.803 m)   Wt 78.9 kg   SpO2 95%   BMI 24.27 kg/m  Physical Exam Vitals and nursing note reviewed.  Constitutional:       General: He is not in acute distress.    Appearance: He is well-developed.  HENT:     Head: Normocephalic and atraumatic.  Eyes:     Conjunctiva/sclera: Conjunctivae normal.  Cardiovascular:     Rate and Rhythm: Normal rate and regular rhythm.  Pulmonary:     Effort: Pulmonary effort is normal. No respiratory distress.     Breath sounds: Normal breath sounds.  Chest:     Comments: Sternotomy site appears to be healing well, no surrounding erythema or tenderness Abdominal:     Palpations: Abdomen is soft.     Tenderness: There is no abdominal tenderness.  Musculoskeletal:        General: No swelling.     Cervical back: Neck supple.  Skin:    General: Skin is warm and dry.     Capillary Refill: Capillary refill takes less than 2 seconds.  Neurological:     Mental Status: He is alert.  Psychiatric:        Mood and Affect: Mood normal.     ED Results / Procedures / Treatments   Labs (all labs ordered are listed, but only abnormal results are displayed) Labs Reviewed - No data to display  EKG None  Radiology No results found.  Procedures Procedures    Medications Ordered in ED Medications - No data to display  ED Course/ Medical Decision Making/ A&P                             Medical Decision Making Amount and/or Complexity of Data Reviewed Labs: ordered. Radiology: ordered.  Risk Prescription drug management.   77 year old male with medical history significant for recent CABG 1.5 weeks ago who presents to the emergency department from his rehab facility with a complaint of sore throat, nosebleed and coughing up blood since last night.  He endorses some discomfort in his throat 2 out of 10 in severity.  He states that he coughed up a blood clot earlier this morning that was moderate in size.  He has no further episodes of hemoptysis since being here in the emergency department.  On arrival, the patient was vitally stable.  Presenting with an episode of  moderate volume hemoptysis, no further episodes since.  Considered bronchitis, lung mass, PE.  Laboratory evaluation significant for COVID-19 PCR testing negative, CBC without a leukocytosis, mild anemia to 11.0, CMP with mildly elevated serum creatinine to 1.26, INR 1.3.  The patient was administered Norco for pain control.  CT angiogram PE study was performed revealed the following:   IMPRESSION:  1.  No pulmonary emboli.  2. Moderate size pericardial effusion.  3. Extensive coronary artery calcification.  4. Bilateral pleural effusions layering dependently, larger on the  left than the right. Subtotal collapse of the left lower lobe.  5. Cirrhosis of the liver.    Aortic Atherosclerosis (ICD10-I70.0).      {Document critical care time when appropriate:1} {Document review of labs and clinical decision tools ie heart score, Chads2Vasc2 etc:1}  {Document your independent review of radiology images, and any outside records:1} {Document your discussion with family members, caretakers, and with consultants:1} {Document social determinants of health affecting pt's care:1} {Document your decision making why or why not admission, treatments were needed:1} Final Clinical Impression(s) / ED Diagnoses Final diagnoses:  None    Rx / DC Orders ED Discharge Orders     None

## 2022-11-25 NOTE — Evaluation (Signed)
Physical Therapy Evaluation Patient Details Name: Collin Bridges MRN: [id number] DOB: 07/02/1945 Today's Date: 11/25/2022  History of Present Illness  77 yo male admitted 6/27 for CABG x 5, d/c to SNF 7/8 and presents to ED on 7/9 (d/c from ED) and back on 7/10 for sore throat, nose bleed, hemoptysis. CXR shows small L pleural effusion; CT angiogram PE study shows pericardial effusion, bilat pleural effusions, liver cirrhosis. PMHX: CAD, HTN, anxiety, BPH, HLD, CKD, CVA, T2DM, RLS  Clinical Impression   Pt presents with generalized weakness, impaired balance, difficulty mobilizing with sternal precautions, and decreased activity tolerance. Pt to benefit from acute PT to address deficits. Pt ambulated short hallway distance, overall requiring light physical assist especially with transfers into standing. Pt from rehab, would benefit from continued post-acute rehab to return to PLOF. Pt wants to go back to rehab to progress to a point where he can d/c home with girlfriend.  PT to progress mobility as tolerated, and will continue to follow acutely.          Assistance Recommended at Discharge Frequent or constant Supervision/Assistance  If plan is discharge home, recommend the following:  Can travel by private vehicle  Assistance with cooking/housework;Assist for transportation;Help with stairs or ramp for entrance;A little help with walking and/or transfers;A little help with bathing/dressing/bathroom   Yes    Equipment Recommendations Rolling walker (2 wheels);BSC/3in1  Recommendations for Other Services       Functional Status Assessment Patient has had a recent decline in their functional status and demonstrates the ability to make significant improvements in function in a reasonable and predictable amount of time.     Precautions / Restrictions Precautions Precautions: Sternal;Fall Restrictions Weight Bearing Restrictions: Yes Other Position/Activity Restrictions: sternal  precautions      Mobility  Bed Mobility Overal bed mobility: Needs Assistance Bed Mobility: Supine to Sit Rolling: Min guard Sidelying to sit: Min guard, HOB elevated       General bed mobility comments: use of hob elevation, pt with good recall of precuations during    Transfers Overall transfer level: Needs assistance Equipment used: Rolling walker (2 wheels) Transfers: Sit to/from Stand Sit to Stand: Min assist           General transfer comment: assist for power up, rise, and steady. Pt placing hands on knees to rise to maintain precautions    Ambulation/Gait Ambulation/Gait assistance: Min guard Gait Distance (Feet): 100 Feet Assistive device: Rolling walker (2 wheels) Gait Pattern/deviations: Decreased stride length, Trunk flexed, Step-through pattern Gait velocity: decr     General Gait Details: for safety, cues for proximity to [employer] Mobility     Tilt Bed    Modified Rankin (Stroke Patients Only)       Balance Overall balance assessment: Needs assistance Sitting-balance support: No upper extremity supported, Feet supported Sitting balance-Leahy Scale: Fair     Standing balance support: Reliant on assistive device for balance Standing balance-Leahy Scale: Poor Standing balance comment: rw                             Pertinent Vitals/Pain Pain Assessment Pain Assessment: Faces Faces Pain Scale: Hurts a little bit Pain Location: chest/incision Pain Descriptors / Indicators: Discomfort Pain Intervention(s): Limited activity within patient's tolerance, Monitored during session, Repositioned    Home Living Family/patient expects to be discharged to:: Skilled nursing facility Living  Arrangements: Alone Available Help at Discharge: Friend(s);Available PRN/intermittently Type of Home: House Home Access: Stairs to enter Entrance Stairs-Rails: Doctor, general practice of Steps: 5   Home  Layout: One level Home Equipment: Grab bars - tub/shower      Prior Function Prior Level of Function : Independent/Modified Independent             Mobility Comments: pt has been using RW for gait, requires some assist with transfers given sternal precautions ADLs Comments: does not drive, ind with ADLs/IADLs     Hand Dominance   Dominant Hand: Right    Extremity/Trunk Assessment   Upper Extremity Assessment Upper Extremity Assessment: Defer to OT evaluation    Lower Extremity Assessment Lower Extremity Assessment: Generalized weakness    Cervical / Trunk Assessment Cervical / Trunk Assessment: Normal  Communication   Communication: No difficulties  Cognition Arousal/Alertness: Awake/alert Behavior During Therapy: Flat affect Overall Cognitive Status: No family/caregiver present to determine baseline cognitive functioning Area of Impairment: Problem solving                             Problem Solving: Slow processing, Requires verbal cues General Comments: good verbal recall of precautions during PT eval        General Comments General comments (skin integrity, edema, etc.): vss on RA    Exercises     Assessment/Plan    PT Assessment Patient needs continued PT services  PT Problem List Decreased activity tolerance;Decreased balance;Decreased mobility;Decreased knowledge of use of DME;Decreased strength;Decreased safety awareness;Cardiopulmonary status limiting activity;Pain       PT Treatment Interventions DME instruction;Therapeutic exercise;Gait training;Balance training;Functional mobility training;Therapeutic activities;Stair training;Patient/family education    PT Goals (Current goals can be found in the Care Plan section)  Acute Rehab PT Goals Patient Stated Goal: back to rehab to get stronger before I go home with my girlfriend PT Goal Formulation: With patient Time For Goal Achievement: 12/09/22 Potential to Achieve Goals: Good     Frequency Min 1X/week     Co-evaluation               AM-PAC PT "6 Clicks" Mobility  Outcome Measure Help needed turning from your back to your side while in a flat bed without using bedrails?: A Little Help needed moving from lying on your back to sitting on the side of a flat bed without using bedrails?: A Little Help needed moving to and from a bed to a chair (including a wheelchair)?: A Little Help needed standing up from a chair using your arms (e.g., wheelchair or bedside chair)?: A Little Help needed to walk in hospital room?: A Little Help needed climbing 3-5 steps with a railing? : A Lot 6 Click Score: 17    End of Session   Activity Tolerance: Patient tolerated treatment well Patient left: in chair;with call bell/phone within reach;with chair alarm set Nurse Communication: Mobility status PT Visit Diagnosis: Other abnormalities of gait and mobility (R26.89);Muscle weakness (generalized) (M62.81);Difficulty in walking, not elsewhere classified (R26.2)    Time: 7829-5621 PT Time Calculation (min) (ACUTE ONLY): 14 min   Charges:   PT Evaluation $PT Eval Low Complexity: 1 Low   PT General Charges $$ ACUTE PT VISIT: 1 Visit         Marye Round, PT DPT Acute Rehabilitation Services Secure Chat Preferred  Office [phone]   Nilah Belcourt E Christain [place] 11/25/2022, 2:17 PM

## 2022-11-25 NOTE — TOC Initial Note (Addendum)
Transition of Care [facility]) - Initial/Assessment Note    Patient Details  Name: Collin Bridges MRN: [id number] Date of Birth: [birth year]  Transition of [facility]) CM/SW Contact:    Delilah Shan, LCSWA Phone Number: 11/25/2022, 2:26 PM  Clinical Narrative:                  CSW received consult for possible SNF placement at time of discharge. CSW spoke with patient at beside regarding PT recommendation of SNF placement at time of discharge. Patient reports PTA he comes from [facility] short term.Patient expressed understanding of PT recommendation and is agreeable to SNF placement at time of discharge.Patient would like to return to [facility] for [facility] term [facility]. CSW discussed insurance authorization process.No further questions reported at this time. CSW started insurance authorization for patient. Auth ID# [id number]. Insurance authorization currently pending. CSW to continue to follow and assist with discharge planning needs.   Update- [facility] [employer] authorization still pending. CSW spoke with [facility] who informed CSW can accept tomorrow if we receive insurance authorization approval for SNF for patient.   Expected Discharge Plan: Skilled Nursing Facility Barriers to Discharge: Continued Medical Work up   Patient Goals and CMS Choice Patient states their goals for this hospitalization and ongoing recovery are:: SNF CMS Medicare.gov Compare Post Acute Care list provided to:: Patient Choice offered to / list presented to : Patient      Expected Discharge Plan and Services In-house Referral: Clinical Social Work     Living arrangements for the past 2 months:  (came from SNF short term lives at home alone)                                      Prior Living Arrangements/Services Living arrangements for the past 2 months:  (came from SNF short term lives at home alone) Lives with:: Self Patient language and need for interpreter reviewed:: Yes Do you feel safe going  back to the place where you live?: No   SNF  Need for Family Participation in Patient Care: Yes (Comment) Care giver support system in place?: Yes (comment)   Criminal Activity/Legal Involvement Pertinent to Current Situation/Hospitalization: No - Comment as needed  Activities of Daily Living Home Assistive Devices/Equipment: Cane (specify quad or straight), Eyeglasses, Blood pressure cuff, Dentures (specify type), Hearing aid ADL Screening (condition at time of admission) Patient's cognitive ability adequate to safely complete daily activities?: Yes Is the patient deaf or have difficulty hearing?: Yes Does the patient have difficulty seeing, even when wearing glasses/contacts?: No Does the patient have difficulty concentrating, remembering, or making decisions?: Yes Patient able to express need for assistance with ADLs?: Yes Does the patient have difficulty dressing or bathing?: No Independently performs ADLs?: No Communication: Independent Dressing (OT): Needs assistance Is this a change from baseline?: Pre-admission baseline Grooming: Independent with device (comment) Feeding: Independent Bathing: Needs assistance Is this a change from baseline?: Pre-admission baseline Toileting: Needs assistance Is this a change from baseline?: Pre-admission baseline In/Out Bed: Needs assistance Is this a change from baseline?: Pre-admission baseline Walks in Home: Needs assistance Is this a change from baseline?: Pre-admission baseline Does the patient have difficulty walking or climbing stairs?: Yes Weakness of Legs: Both Weakness of Arms/Hands: Both  Permission Sought/Granted Permission sought to share information with : Case Manager, Family Supports, Oceanographer granted to share information with : Yes, Verbal Permission Granted  Share Information with NAME: Lynnette Caffey  Permission granted to share info w AGENCY: SNF  Permission granted to share info w  Relationship: significant other  Permission granted to share info w Contact Information: Lynnette Caffey [phone]  Emotional Assessment Appearance:: Appears stated age Attitude/Demeanor/Rapport: Gracious Affect (typically observed): Calm Orientation: : Oriented to Self, Oriented to Place, Oriented to  Time, Oriented to Situation Alcohol / Substance Use: Not Applicable Psych Involvement: No (comment)  Admission diagnosis:  Pericardial effusion [I31.39] Hemoptysis [R04.2] Patient Active Problem List   Diagnosis Date Noted   Pericardial effusion 11/23/2022   S/P CABG x 5 11/21/2022   Pressure injury of skin 11/21/2022   Coronary artery disease 11/10/2022   CAD (coronary artery disease) 10/31/2022   Aortic atherosclerosis (HCC) 10/05/2022   NSTEMI (non-ST elevated myocardial infarction) (HCC) 07/12/2022   Abnormal cardiac CT angiography 07/12/2022   Severe sepsis (HCC) 07/09/2022   Chest pain 07/09/2022   Elevated troponin 07/09/2022   Depression 07/09/2022   Allergic rhinitis 07/09/2022   Chronic diastolic CHF (congestive heart failure) (HCC) 07/09/2022   CAP (community acquired pneumonia) 07/08/2022   Left leg cellulitis 06/03/2022   Cat scratch of left lower leg 05/18/2022   Skin infection 05/18/2022   DM2 (diabetes mellitus, type 2) (HCC) 12/01/2021   RLS (restless legs syndrome) 12/01/2021   Vitamin D deficiency 06/03/2021   History of stroke 04/25/2021   CVA (cerebral vascular accident) (HCC) 04/07/2021   Brain mass 03/17/2021   Headache 03/17/2021   Cerebral atrophy (HCC) 03/17/2021   Stroke of right basal ganglia (HCC) 03/17/2021   Forehead pain 03/17/2021   Increased prostate specific antigen (PSA) velocity 12/14/2020   Stage 3a chronic kidney disease (CKD) (HCC) 12/14/2020   Hyperlipidemia LDL goal <70 08/09/2017   Encounter for well adult exam with abnormal findings 07/29/2016   Mild aortic stenosis 07/29/2016   Insomnia 07/29/2016   Erectile dysfunction 03/30/2016    Anxiety disorder 01/06/2016   Benign prostatic hyperplasia with urinary frequency 01/06/2016   Essential hypertension 02/02/2012   Hypoglycemia November 12, 1945   PCP:  Corwin Levins, MD Pharmacy:   [facility] - [place], [place] - [phone] [address] [place] [address] Phone: [phone] Fax: [phone]  [id number] [place], [place] - [address] AT [facility] OF [address] [place] [address] Phone: [phone] Fax: [phone]  [facility] [phone] - [place], [place] - [address] AT [facility] OF [address] [place] [address] Phone: [phone] Fax: [phone]  [facility] [address] [place] [address] Phone: [phone] Fax: [phone]     Social Determinants of Health (SDOH) Social History: SDOH Screenings   Food Insecurity: No Food Insecurity (11/24/2022)  Housing: Low Risk  (11/24/2022)  Transportation Needs: No Transportation Needs (11/24/2022)  Utilities: Not At Risk (11/24/2022)  Depression (PHQ2-9): High Risk (07/18/2022)  Financial Resource Strain: Low Risk  (09/29/2022)  Physical Activity: Insufficiently Active (09/29/2022)  Social Connections: Socially Isolated (09/29/2022)  Stress: No Stress Concern Present (09/29/2022)  Tobacco Use: Medium Risk (11/23/2022)   SDOH Interventions:     Readmission Risk Interventions     No data to display

## 2022-11-26 DIAGNOSIS — I3139 Other pericardial effusion (noninflammatory): Secondary | ICD-10-CM | POA: Diagnosis not present

## 2022-11-26 LAB — GLUCOSE, CAPILLARY
Glucose-Capillary: 116 mg/dL — ABNORMAL HIGH (ref 70–99)
Glucose-Capillary: 121 mg/dL — ABNORMAL HIGH (ref 70–99)
Glucose-Capillary: 128 mg/dL — ABNORMAL HIGH (ref 70–99)
Glucose-Capillary: 126 mg/dL — ABNORMAL HIGH (ref 70–99)
Glucose-Capillary: 184 mg/dL — ABNORMAL HIGH (ref 70–99)

## 2022-11-26 NOTE — Progress Notes (Signed)
PROGRESS NOTE    Collin Bridges  HYQ:[id number] DOB: 06/28/1945 DOA: 11/23/2022 PCP: Corwin Levins, MD   Brief Narrative:  HPI: Collin Bridges is a 77 y.o. male with medical history significant of CAD status post recent CABG, CHF, type 2 diabetes, GERD, hypertension, prior stroke who presented to the emergency department due to 2 days of hemoptysis.  He was seen in the ER yesterday and underwent a workup which included CT chest which was largely unrevealing.  He was discharged with outpatient follow-up.  Since discharging from the emergency department yesterday he has developed persistent gross bloody sputum he presents today for further workup.   On arrival he was afebrile and hemodynamically stable.  Labs were obtained which revealed old WBC 10.5, hemoglobin 10.8 at baseline, creatinine 1.2, troponin 19, BNP 369.  Patient had intraoperative echocardiogram on 6/27.  He has not had imaging of his heart stents.  He had a chest x-ray today which showed small left pleural effusion.  CT angiogram pulmonary embolism study on visit to ER yesterday showed moderate-sized pericardial effusion with bilateral pleural effusions and cirrhosis of the liver.  Assessment & Plan:   Principal Problem:   Pericardial effusion Active Problems:   Essential hypertension   Hyperlipidemia LDL goal <70   Stage 3a chronic kidney disease (CKD) (HCC)   DM2 (diabetes mellitus, type 2) (HCC)   CAD (coronary artery disease)   S/P CABG x 5  Hemoptysis: Patient admitted with hemoptysis of 2 days duration.  Denies any shortness of breath.  Chest x-ray raised suspicion for possible left lower lobe infiltrate.  CTA negative for PE.  No leukocytosis and afebrile.  Procalcitonin unremarkable.  No more hemoptysis since admission.  Doubt pneumonia.  Does not need antibiotics.  Patient medically stable since 11/24/2022 and can be discharged back to SNF however still needs to be seen by PT OT and still needs insurance  authorization which is pending.  History of CAD status post CABG x 5 2 weeks ago: Seen by CT surgery, patient appears to be stable from their standpoint.  Mild pericardial effusion: Seen by CT surgery, this is expected per CTS.  Chest pain: No more chest pain.  Slight elevated troponin but flat, ACS ruled out.  CKD stage IIIa: At baseline.  Essential hypertension: Blood pressure controlled.  Continue home medications.  Hyperlipidemia: Continue home medications.  History of type 2 diabetes mellitus: Recent hemoglobin A1c however is only 5.8.  He takes metformin at home which is on hold.  Will continue SSI.  DVT prophylaxis: enoxaparin (LOVENOX) injection 40 mg Start: 11/23/22 2145 SCDs Start: 11/23/22 2142   Code Status: Full Code  Family Communication:  None present at bedside.  Plan of care discussed with patient in length and he/she verbalized understanding and agreed with it.  Status is: Inpatient Remains inpatient appropriate because: Medically stable, pending insurance authorization.   Estimated body mass index is 23.06 kg/m as calculated from the following:   Height as of this encounter: 6' (1.829 m).   Weight as of this encounter: 77.1 kg.     Nutritional Assessment: Body mass index is 23.06 kg/m.Marland Kitchen Seen by dietician.  I agree with the assessment and plan as outlined below: Nutrition Status:   Skin Assessment: I have examined the patient's skin and I agree with the wound assessment as performed by the wound care RN as outlined below:   Consultants:  CT surgery  Procedures:  None  Antimicrobials:  Anti-infectives (From admission, onward)  None         Subjective: Patient seen and examined.  No complaints.  No hemoptysis.  No shortness of breath  Objective: Vitals:   11/26/22 0339 11/26/22 0535 11/26/22 0749 11/26/22 0934  BP: 108/62 129/66 121/66   Pulse: 66  81 68  Resp: (!) 21  16   Temp: 97.8 F (36.6 C)  97.8 F (36.6 C)   TempSrc: Oral   Oral   SpO2: 96%  96%   Weight:      Height:        Intake/Output Summary (Last 24 hours) at 11/26/2022 1142 Last data filed at 11/26/2022 1015 Gross per 24 hour  Intake --  Output 1050 ml  Net -1050 ml   Filed Weights   11/23/22 1637  Weight: 77.1 kg    Examination:  General exam: Appears calm and comfortable  Respiratory system: Clear to auscultation. Respiratory effort normal. Cardiovascular system: S1 & S2 heard, RRR. No JVD, murmurs, rubs, gallops or clicks. No pedal edema. Gastrointestinal system: Abdomen is nondistended, soft and nontender. No organomegaly or masses felt. Normal bowel sounds heard. Central nervous system: Alert and oriented. No focal neurological deficits. Extremities: Symmetric 5 x 5 power. Skin: No rashes, lesions or ulcers.  Psychiatry: Judgement and insight appear normal. Mood & affect appropriate.   Data Reviewed: I have personally reviewed following labs and imaging studies  CBC: Recent Labs  Lab 11/22/22 1230 11/23/22 1829 11/23/22 2214 11/24/22 0445 11/25/22 0309  WBC 9.1 10.5 8.4 8.6 8.0  NEUTROABS 7.1 7.7  --   --  5.2  HGB 11.0* 10.8* 10.6* 11.0* 11.1*  HCT 33.1* 32.9* 32.0* 33.2* 33.0*  MCV 91.7 89.9 89.6 90.7 90.2  PLT 295 305 287 289 284   Basic Metabolic Panel: Recent Labs  Lab 11/20/22 0110 11/22/22 1230 11/23/22 1829 11/23/22 2214 11/25/22 0309  NA 133* 134* 137  --  137  K 3.7 3.9 4.0  --  3.7  CL 103 103 100  --  100  CO2 23 23 25   --  24  GLUCOSE 101* 119* 112*  --  114*  BUN 15 14 13   --  15  CREATININE 1.29* 1.26* 1.26* 1.24 1.30*  CALCIUM 8.4* 8.6* 9.2  --  9.1   GFR: Estimated Creatinine Clearance: 51.9 mL/min (A) (by C-G formula based on SCr of 1.3 mg/dL (H)). Liver Function Tests: Recent Labs  Lab 11/22/22 1230  AST 45*  ALT 27  ALKPHOS 116  BILITOT 1.4*  PROT 5.9*  ALBUMIN 3.2*   No results for input(s): "LIPASE", "AMYLASE" in the last 168 hours. No results for input(s): "AMMONIA" in the last  168 hours. Coagulation Profile: Recent Labs  Lab 11/22/22 1230  INR 1.3*   Cardiac Enzymes: No results for input(s): "CKTOTAL", "CKMB", "CKMBINDEX", "TROPONINI" in the last 168 hours. BNP (last 3 results) No results for input(s): "PROBNP" in the last 8760 hours. HbA1C: No results for input(s): "HGBA1C" in the last 72 hours. CBG: Recent Labs  Lab 11/25/22 0715 11/25/22 1211 11/25/22 1556 11/25/22 2130 11/26/22 0721  GLUCAP 129* 140* 139* 143* 116*   Lipid Profile: No results for input(s): "CHOL", "HDL", "LDLCALC", "TRIG", "CHOLHDL", "LDLDIRECT" in the last 72 hours. Thyroid Function Tests: No results for input(s): "TSH", "T4TOTAL", "FREET4", "T3FREE", "THYROIDAB" in the last 72 hours. Anemia Panel: No results for input(s): "VITAMINB12", "FOLATE", "FERRITIN", "TIBC", "IRON", "RETICCTPCT" in the last 72 hours. Sepsis Labs: Recent Labs  Lab 11/24/22 1055  PROCALCITON <0.10  Recent Results (from the past 240 hour(s))  SARS Coronavirus 2 by RT PCR (hospital order, performed in [facility] hospital lab) *cepheid single result test* Anterior Nasal Swab     Status: None   Collection Time: 11/22/22  1:28 PM   Specimen: Anterior Nasal Swab  Result Value Ref Range Status   SARS Coronavirus 2 by RT PCR NEGATIVE NEGATIVE Final    Comment: Performed at [facility], [address], [place], [place] [address]     Radiology Studies: No results found.  Scheduled Meds:  amiodarone  200 mg Oral BID   aspirin EC  325 mg Oral Daily   atorvastatin  80 mg Oral Daily   enoxaparin (LOVENOX) injection  40 mg Subcutaneous Q24H   fenofibrate  160 mg Oral Daily   gabapentin  300 mg Oral QHS   hydrALAZINE  25 mg Oral Q8H   insulin aspart  0-5 Units Subcutaneous QHS   insulin aspart  0-9 Units Subcutaneous TID WC   losartan  100 mg Oral Daily   metoprolol tartrate  25 mg Oral BID   pantoprazole  40 mg Oral Daily   PARoxetine  40 mg Oral BH-q7a   Continuous Infusions:   LOS: 3  days   Hughie Closs, MD [facility]  11/26/2022, 11:42 AM   *Please note that this is a verbal dictation therefore any spelling or grammatical errors are due to the "Dragon [employer] One" system interpretation.  Please page via Amion and do not message via secure chat for urgent patient care matters. Secure chat can be used for non urgent patient care matters.  How to contact the [facility] Attending or Consulting provider 7A - 7P or covering provider during after hours 7P -7A, for this patient?  Check the care team in [facility] and look for a) attending/consulting TRH provider listed and b) the [facility] team listed. Page or secure chat 7A-7P. Log into [email or web address] and use [facility]'s universal password to access. If you do not have the password, please contact the hospital operator. Locate the [facility] provider you are looking for under [facility] and page to a number that you can be directly reached. If you still have difficulty reaching the provider, please page the [facility] (Director on Call) for the Hospitalists listed on amion for assistance.

## 2022-11-26 NOTE — TOC Progression Note (Signed)
Transition of Care [facility]) - Progression Note    Patient Details  Name: Franz Aseltine MRN: [id number] Date of Birth: [birth year]  [facility]) CM/SW Contact  [address] [place], [place] Phone Number: 11/26/2022, 11:08 AM  Clinical Narrative:    [facility] contacted regarding authorization. For SNF stay.  Authorization pending-under review.  TOC to continue to follow  Norberta Stobaugh, LCSW Transition of Care     Expected Discharge Plan: Skilled Nursing Facility Barriers to Discharge: Continued Medical Work up  Expected Discharge Plan and Services In-house Referral: Clinical Social Work     Living arrangements for the past 2 months:  (came from SNF short term lives at home alone)                                       Social Determinants of Health (SDOH) Interventions SDOH Screenings   Food Insecurity: No Food Insecurity (11/24/2022)  Housing: Low Risk  (11/24/2022)  Transportation Needs: No Transportation Needs (11/24/2022)  Utilities: Not At Risk (11/24/2022)  Depression (PHQ2-9): High Risk (07/18/2022)  Financial Resource Strain: Low Risk  (09/29/2022)  Physical Activity: Insufficiently Active (09/29/2022)  Social Connections: Socially Isolated (09/29/2022)  Stress: No Stress Concern Present (09/29/2022)  Tobacco Use: Medium Risk (11/23/2022)    Readmission Risk Interventions     No data to display

## 2022-11-27 DIAGNOSIS — I3139 Other pericardial effusion (noninflammatory): Secondary | ICD-10-CM | POA: Diagnosis not present

## 2022-11-27 LAB — GLUCOSE, CAPILLARY
Glucose-Capillary: 128 mg/dL — ABNORMAL HIGH (ref 70–99)
Glucose-Capillary: 107 mg/dL — ABNORMAL HIGH (ref 70–99)

## 2022-11-27 MED ORDER — POLYETHYLENE GLYCOL 3350 17 G PO PACK
17.0000 g | PACK | Freq: Every day | ORAL | Status: DC
  Administered 2022-11-27: 17 g via ORAL
  Filled 2022-11-27: qty 1

## 2022-11-27 MED ORDER — SENNOSIDES-DOCUSATE SODIUM 8.6-50 MG PO TABS
1.0000 | ORAL_TABLET | Freq: Two times a day (BID) | ORAL | Status: DC
  Administered 2022-11-27: 1 via ORAL
  Filled 2022-11-27: qty 1

## 2022-11-27 NOTE — TOC Transition Note (Signed)
Transition of Care [facility]) - CM/SW Discharge Note   Patient Details  Name: Collin Bridges MRN: [id number] Date of Birth: 10/25/1945  Transition [facility] Care [facility]) CM/SW Contact:  Lorri Frederick, LCSW Phone Number: 11/27/2022, 11:52 AM   Clinical Narrative:  [facility], room [id number].  RN call report to [phone].  Pt friend will be coming to [facility] to transport him at 1300.  Pt will need to be brought down to main entrance and helped into the vehicle.       Final next level of care: Skilled Nursing Facility Barriers to Discharge: Barriers Resolved   Patient Goals and CMS Choice CMS Medicare.gov Compare Post Acute Care list provided to:: Patient Choice offered to / list presented to : Patient  Discharge Placement                [facility]) Patient to be transferred to facility by: friend Name of family member notified: left m essages with friends Morrie Sheldon and Marchelle Folks Patient and family notified of of transfer: 11/27/22  Discharge Plan and Services Additional resources added to the After Visit Summary for   In-house Referral: Clinical Social Work                                   Social Determinants of Health (SDOH) Interventions SDOH Screenings   Food Insecurity: No Food Insecurity (11/24/2022)  Housing: Low Risk  (11/24/2022)  Transportation Needs: No Transportation Needs (11/24/2022)  Utilities: Not At Risk (11/24/2022)  Depression (PHQ2-9): High Risk (07/18/2022)  Financial Resource Strain: Low Risk  (09/29/2022)  Physical Activity: Insufficiently Active (09/29/2022)  Social Connections: Socially Isolated (09/29/2022)  Stress: No Stress Concern Present (09/29/2022)  Tobacco Use: Medium Risk (11/23/2022)     Readmission Risk Interventions     No data to display

## 2022-11-27 NOTE — Progress Notes (Signed)
Report called an given to [place], Charity fundraiser at receiving facility.

## 2022-11-27 NOTE — TOC Progression Note (Addendum)
Transition of Care [facility]) - Progression Note    Patient Details  Name: Collin Bridges MRN: [id number] Date of Birth: 24-Jul-1945  Transition of Care [facility]) CM/SW Contact  Lorri Frederick, LCSW Phone Number: 11/27/2022, 9:17 AM  Clinical Narrative:   [facility] approved in [place]: [id number], [id number], 4 days: 7/13-7/16.  Message to [facility]: she can accept pt today.  MD informed.    1120: CSW spoke with pt regarding transportation to SNF, discussed that he does not require ambulance transportation.  Pt called friend who is able to provide transportation to SNF at 1300.  RN informed.   Expected Discharge Plan: Skilled Nursing Facility Barriers to Discharge: Continued Medical Work up  Expected Discharge Plan and Services In-house Referral: Clinical Social Work     Living arrangements for the past 2 months:  (came from SNF short term lives at home alone)                                       Social Determinants of Health (SDOH) Interventions SDOH Screenings   Food Insecurity: No Food Insecurity (11/24/2022)  Housing: Low Risk  (11/24/2022)  Transportation Needs: No Transportation Needs (11/24/2022)  Utilities: Not At Risk (11/24/2022)  Depression (PHQ2-9): High Risk (07/18/2022)  Financial Resource Strain: Low Risk  (09/29/2022)  Physical Activity: Insufficiently Active (09/29/2022)  Social Connections: Socially Isolated (09/29/2022)  Stress: No Stress Concern Present (09/29/2022)  Tobacco Use: Medium Risk (11/23/2022)    Readmission Risk Interventions     No data to display

## 2022-11-27 NOTE — Discharge Summary (Signed)
Physician Discharge Summary  [facility] WUJ:[id number] DOB: 05/05/1946 DOA: 11/23/2022  PCP: Corwin Levins, MD  Admit date: 11/23/2022 Discharge date: 11/27/2022 30 Day Unplanned Readmission Risk Score    Flowsheet Row ED to Hosp-Admission (Current) from 11/23/2022 in [facility]  30 Day Unplanned Readmission Risk Score (%) 20.88 Filed at 11/27/2022 0801       This score is the patient's risk of an unplanned readmission within 30 days of being discharged (0 -100%). The score is based on dignosis, age, lab data, medications, orders, and past utilization.   Low:  0-14.9   Medium: 15-21.9   High: 22-29.9   Extreme: 30 and above          Admitted From: SNF Disposition: SNF  Recommendations for Outpatient Follow-up:  Follow up with PCP in 1-2 weeks Please obtain BMP/CBC in one week Please follow up with your PCP on the following pending results: Unresulted Labs (From admission, onward)     Start     Ordered   11/30/22 0500  Creatinine, serum  (enoxaparin (LOVENOX)    CrCl >/= 30 ml/min)  Weekly,   R     Comments: while on enoxaparin therapy    11/23/22 2142              Home Health: None Equipment/Devices: None  Discharge Condition: Stable CODE STATUS: Full code Diet recommendation: Cardiac  Subjective: Seen and examined.  No complaints other than mild constipation.  Brief/Interim Summary: Demarus Character is a 77 y.o. male with medical history significant of CAD status post recent CABG, CHF, type 2 diabetes, GERD, hypertension, prior stroke who presented to the emergency department due to 2 days of hemoptysis.  He was seen in the ER day before and underwent a workup which included CT chest which was largely unrevealing.  He was discharged with outpatient follow-up.  Since discharging from the emergency department, he developed persistent gross bloody sputum so he returned to the ED.   On arrival he was afebrile and hemodynamically  stable.  Labs were obtained which revealed old WBC 10.5, hemoglobin 10.8 at baseline, creatinine 1.2, troponin 19, BNP 369.  Patient had intraoperative echocardiogram on 6/27.  He has not had imaging of his heart stents.  He had a chest x-ray today which showed small left pleural effusion.  CT angiogram pulmonary embolism study on visit to ER showed moderate-sized pericardial effusion with bilateral pleural effusions and cirrhosis of the liver.  Patient was admitted under hospitalist service for further management.  Patient was admitted under hospitalist service for further management, details below.    Hemoptysis: Patient admitted with hemoptysis of 2 days duration.  Denies any shortness of breath.  Chest x-ray raised suspicion for possible left lower lobe infiltrate.  CTA negative for PE.  No leukocytosis and afebrile.  Procalcitonin unremarkable.  No more hemoptysis since admission.  Doubt pneumonia.  Does not need antibiotics.  Patient is medically stable and he is being discharged back to SNF today.  He was also seen by CT surgery during this hospitalization and they opined that his hemoptysis has nothing to do with the recent surgery.   History of CAD status post CABG x 5 2 weeks ago: Seen by CT surgery, patient appears to be stable from their standpoint.   Mild pericardial effusion: Seen by CT surgery, this is expected per CTS.   Chest pain: No more chest pain.  Slight elevated troponin but flat, ACS ruled out.  CKD stage IIIa: At baseline.   Essential hypertension: Blood pressure controlled.  Continue home medications.   Hyperlipidemia: Continue home medications.   History of type 2 diabetes mellitus: Recent hemoglobin A1c however is only 5.8.  He takes metformin at home.   Discharge Diagnoses:  Principal Problem:   Pericardial effusion Active Problems:   Essential hypertension   Hyperlipidemia LDL goal <70   Stage 3a chronic kidney disease (CKD) (HCC)   DM2 (diabetes mellitus,  type 2) (HCC)   CAD (coronary artery disease)   S/P CABG x 5    Discharge Instructions   Allergies as of 11/27/2022   No Known Allergies      Medication List     STOP taking these medications    zolpidem 10 MG tablet Commonly known as: AMBIEN       TAKE these medications    amiodarone 200 MG tablet Commonly known as: PACERONE Take 1 tablet (200 mg total) by mouth 2 (two) times daily. X 7 days, then decrease to 200 mg daily   aspirin EC 325 MG tablet Take 1 tablet (325 mg total) by mouth daily.   atorvastatin 80 MG tablet Commonly known as: LIPITOR Take 1 tablet (80 mg total) by mouth daily.   cholecalciferol 25 MCG (1000 UNIT) tablet Commonly known as: VITAMIN D3 Take 1 tablet (1,000 Units total) by mouth daily. Take 1,000 Units by mouth daily.   fenofibrate 160 MG tablet Take 1 tablet (160 mg total) by mouth daily.   fluticasone 50 MCG/ACT nasal spray Commonly known as: FLONASE Place 2 sprays into both nostrils daily.   gabapentin 300 MG capsule Commonly known as: NEURONTIN Take 1 capsule (300 mg total) by mouth at bedtime.   hydrALAZINE 25 MG tablet Commonly known as: APRESOLINE Take 1 tablet (25 mg total) by mouth every 8 (eight) hours.   losartan 100 MG tablet Commonly known as: COZAAR Take 1 tablet (100 mg total) by mouth daily.   magnesium oxide 400 (240 Mg) MG tablet Commonly known as: MAG-OX Take 400 mg by mouth daily.   Melatonin 10 MG Tabs Take 10 mg by mouth at bedtime.   metFORMIN 500 MG 24 hr tablet Commonly known as: GLUCOPHAGE-XR Take 1 tablet (500 mg) by mouth daily with breakfast.   metoprolol tartrate 25 MG tablet Commonly known as: LOPRESSOR Take 1 tablet (25 mg total) by mouth 2 (two) times daily.   nitroGLYCERIN 0.4 MG SL tablet Commonly known as: Nitrostat Place 1 tablet under the tongue every 5 minutes as needed for chest pain.   PARoxetine 40 MG tablet Commonly known as: PAXIL Take 1 tablet (40 mg total) by mouth  every morning.   Repatha SureClick 140 MG/ML Soaj Generic drug: Evolocumab Inject 140 mg into the skin every 14 days.   vitamin E 180 MG (400 UNITS) capsule Take 400 Units by mouth daily.        Follow-up Information     Corwin Levins, MD Follow up in 1 week(s).   Specialties: Internal Medicine, Radiology Contact information: [address] [place] [address] [phone]                No Known Allergies  Consultations: Cardiothoracic surgery   Procedures/Studies: ECHOCARDIOGRAM COMPLETE  Result Date: 11/24/2022    ECHOCARDIOGRAM REPORT   Patient Name:   Collin Bridges Date of Exam: 11/24/2022 Medical Rec #:  [id number]             Height:  72.0 in Accession #:    [id number]            Weight:       170.0 lb Date of Birth:  Nov 21, 1945            BSA:          1.988 m Patient Age:    76 years              BP:           137/73 mmHg Patient Gender: M                     HR:           71 bpm. Exam Location:  Inpatient Procedure: 2D Echo, Cardiac Doppler and Color Doppler Indications:    Pericardial effusion I31.3  History:        Patient has prior history of Echocardiogram examinations, most                 recent 07/09/2022. CHF, Previous Myocardial Infarction and CAD,                 Prior CABG, Stroke, Signs/Symptoms:Chest Pain; Risk                 Factors:Hypertension, Diabetes and Dyslipidemia. CKD, stage 3.  Sonographer:    Lucendia Herrlich Referring Phys: [id number] ROBERT DORRELL IMPRESSIONS  1. Left ventricular ejection fraction, by estimation, is 60 to 65%. The left ventricle has normal function. The left ventricle has no regional wall motion abnormalities. Diastolic function indeterminant due to significant MAC.  2. Right ventricular systolic function is normal. The right ventricular size is normal. There is normal pulmonary artery systolic pressure. The estimated right ventricular systolic pressure is 15.1 mmHg.  3. A small pericardial effusion is present.  There is no evidence of cardiac tamponade. Moderate pleural effusion in the left lateral region.  4. The mitral valve is degenerative. Trivial mitral valve regurgitation. Moderate mitral annular calcification.  5. The aortic valve is tricuspid. There is moderate calcification of the aortic valve. There is moderate thickening of the aortic valve. Aortic valve regurgitation is not visualized. Mild aortic valve stenosis. Aortic valve mean gradient measures 10.4 mmHg. Aortic valve Vmax measures 2.40 m/s.  6. Aortic dilatation noted. There is borderline dilatation of the ascending aorta, measuring 38 mm.  7. The inferior vena cava is normal in size with greater than 50% respiratory variability, suggesting right atrial pressure of 3 mmHg. Comparison(s): No significant change from prior study. FINDINGS  Left Ventricle: Left ventricular ejection fraction, by estimation, is 60 to 65%. The left ventricle has normal function. The left ventricle has no regional wall motion abnormalities. The left ventricular internal cavity size was normal in size. There is  no left ventricular hypertrophy. Diastolic function indeterminant due to significant MAC. Right Ventricle: The right ventricular size is normal. No increase in right ventricular wall thickness. Right ventricular systolic function is normal. There is normal pulmonary artery systolic pressure. The tricuspid regurgitant velocity is 1.74 m/s, and  with an assumed right atrial pressure of 3 mmHg, the estimated right ventricular systolic pressure is 15.1 mmHg. Left Atrium: Left atrial size was normal in size. Right Atrium: Right atrial size was normal in size. Pericardium: A small pericardial effusion is present. There is no evidence of cardiac tamponade. Mitral Valve: The mitral valve is degenerative in appearance. There is moderate thickening of the mitral valve leaflet(s). There is moderate calcification  of the mitral valve leaflet(s). Moderate mitral annular calcification.  Trivial mitral valve regurgitation. Tricuspid Valve: The tricuspid valve is normal in structure. Tricuspid valve regurgitation is trivial. Aortic Valve: The aortic valve is tricuspid. There is moderate calcification of the aortic valve. There is moderate thickening of the aortic valve. Aortic valve regurgitation is not visualized. Mild aortic stenosis is present. Aortic valve mean gradient measures 10.4 mmHg. Aortic valve peak gradient measures 23.0 mmHg. Aortic valve area, by VTI measures 1.08 cm. Pulmonic Valve: The pulmonic valve was grossly normal. Pulmonic valve regurgitation is trivial. Aorta: Aortic dilatation noted. There is borderline dilatation of the ascending aorta, measuring 38 mm. Venous: The inferior vena cava is normal in size with greater than 50% respiratory variability, suggesting right atrial pressure of 3 mmHg. IAS/Shunts: The atrial septum is grossly normal. Additional Comments: There is a moderate pleural effusion in the left lateral region.  LEFT VENTRICLE PLAX 2D LVIDd:         3.30 cm   Diastology LVIDs:         2.20 cm   LV e' medial:    8.58 cm/s LV PW:         1.10 cm   LV E/e' medial:  7.9 LV IVS:        1.10 cm   LV e' lateral:   6.96 cm/s LVOT diam:     1.80 cm   LV E/e' lateral: 9.8 LV SV:         47 LV SV Index:   24 LVOT Area:     2.54 cm  RIGHT VENTRICLE            IVC RV S prime:     8.27 cm/s  IVC diam: 1.10 cm TAPSE (M-mode): 1.3 cm LEFT ATRIUM             Index        RIGHT ATRIUM           Index LA diam:        3.70 cm 1.86 cm/m   RA Area:     14.60 cm LA Vol (A2C):   61.2 ml 30.78 ml/m  RA Volume:   34.00 ml  17.10 ml/m LA Vol (A4C):   50.8 ml 25.55 ml/m LA Biplane Vol: 60.7 ml 30.53 ml/m  AORTIC VALVE AV Area (Vmax):    0.97 cm AV Area (Vmean):   1.02 cm AV Area (VTI):     1.08 cm AV Vmax:           240.00 cm/s AV Vmean:          147.500 cm/s AV VTI:            0.436 m AV Peak Grad:      23.0 mmHg AV Mean Grad:      10.4 mmHg LVOT Vmax:         91.40 cm/s LVOT  Vmean:        58.867 cm/s LVOT VTI:          0.186 m LVOT/AV VTI ratio: 0.43  AORTA Ao Root diam: 3.40 cm Ao Asc diam:  3.80 cm MITRAL VALVE                TRICUSPID VALVE MV Area (PHT): 2.37 cm     TR Peak grad:   12.1 mmHg MV Decel Time: 320 msec     TR Vmax:        174.00 cm/s MV E velocity: 68.10  cm/s MV A velocity: 108.00 cm/s  SHUNTS MV E/A ratio:  0.63         Systemic VTI:  0.19 m                             Systemic Diam: 1.80 cm Laurance Flatten MD Electronically signed by Laurance Flatten MD Signature Date/Time: 11/24/2022/10:27:28 AM    Final    DG Chest 2 View  Result Date: 11/23/2022 CLINICAL DATA:  Hemoptysis EXAM: CHEST - 2 VIEW COMPARISON:  11/22/2022 FINDINGS: Prior CABG. Heart and mediastinal contours within normal limits. Small left pleural effusion with left lower lobe atelectasis or infiltrate. Right lung clear. No acute bony abnormality. IMPRESSION: Small left pleural effusion with left lower lobe atelectasis or infiltrate. Electronically Signed   By: Charlett Nose M.D.   On: 11/23/2022 17:28   CT Angio Chest PE W and/or Wo Contrast  Result Date: 11/22/2022 CLINICAL DATA:  Pulmonary embolism suspected. High probability. Cough with some hemoptysis. EXAM: CT ANGIOGRAPHY CHEST WITH CONTRAST TECHNIQUE: Multidetector CT imaging of the chest was performed using the standard protocol during bolus administration of intravenous contrast. Multiplanar CT image reconstructions and MIPs were obtained to evaluate the vascular anatomy. RADIATION DOSE REDUCTION: This exam was performed according to the departmental dose-optimization program which includes automated exposure control, adjustment of the mA and/or kV according to patient size and/or use of iterative reconstruction technique. CONTRAST:  75mL OMNIPAQUE IOHEXOL 350 MG/ML SOLN COMPARISON:  Radiography same day.  Previous chest CT 07/11/2022. FINDINGS: Cardiovascular: Heart size upper limits of normal. Moderate size pericardial effusion is  present. There is extensive coronary artery calcification. There is aortic atherosclerotic calcification. Pulmonary arterial opacification is good. No pulmonary emboli are seen. Mediastinum/Nodes: No mass or adenopathy. Lungs/Pleura: Bilateral pleural effusions layering dependently, larger on the left than the right. Mild dependent atelectasis in the right lower lobe. Subtotal collapse of the left lower lobe. Previously seen left lower lobe nodule cannot be evaluated given the pulmonary collapse. Upper Abdomen: Pronounced cirrhosis of the liver. No acute upper abdominal finding. Musculoskeletal: No significant finding. Review of the MIP images confirms the above findings. IMPRESSION: 1. No pulmonary emboli. 2. Moderate size pericardial effusion. 3. Extensive coronary artery calcification. 4. Bilateral pleural effusions layering dependently, larger on the left than the right. Subtotal collapse of the left lower lobe. 5. Cirrhosis of the liver. Aortic Atherosclerosis (ICD10-I70.0). Electronically Signed   By: Paulina Fusi M.D.   On: 11/22/2022 14:52   DG Chest Portable 1 View  Result Date: 11/22/2022 CLINICAL DATA:  Cough EXAM: PORTABLE CHEST 1 VIEW COMPARISON:  11/17/2022 FINDINGS: Previous median sternotomy and CABG. The right lung is clear except for mild atelectasis at the base. On the left, there is more pronounced density in the left lower chest that could be due to a combination of left lower lobe collapse/infiltrate and pleural fluid. IMPRESSION: Abnormal density in the left lower chest that could be due to a combination of left lower lobe collapse/infiltrate and pleural fluid. Electronically Signed   By: Paulina Fusi M.D.   On: 11/22/2022 14:47   DG Chest 2 View  Result Date: 11/17/2022 CLINICAL DATA:  Status post CABG. EXAM: CHEST - 2 VIEW COMPARISON:  11/15/22 FINDINGS: Status post median sternotomy and CABG procedure. The cardiomediastinal contours are unremarkable. Persistent bilateral pleural  effusions and lower lobe atelectasis. No signs of interstitial edema. No pneumothorax identified. IMPRESSION: Persistent bilateral pleural effusions and lower lobe  atelectasis. Electronically Signed   By: Signa Kell M.D.   On: 11/17/2022 09:37   DG CHEST PORT 1 VIEW  Result Date: 11/15/2022 CLINICAL DATA:  Pleural effusion EXAM: PORTABLE CHEST 1 VIEW COMPARISON:  11/14/2022 FINDINGS: Interval removal of right neck vascular sheath. Otherwise unchanged AP portable chest radiograph. Cardiomegaly status post median sternotomy and CABG. Small, layering bilateral pleural effusions and minimal diffuse interstitial opacity. No new airspace opacity. Osseous structures unremarkable. IMPRESSION: 1. Interval removal of right neck vascular sheath. Otherwise unchanged AP portable chest radiograph. 2. Cardiomegaly with small, layering bilateral pleural effusions and minimal diffuse interstitial opacity, consistent with edema. No new airspace opacity. Electronically Signed   By: Jearld Lesch M.D.   On: 11/15/2022 09:53   ECHO INTRAOPERATIVE TEE  Result Date: 11/14/2022  *INTRAOPERATIVE TRANSESOPHAGEAL REPORT *  Patient Name:   GRIMM BASSANI Date of Exam: 11/10/2022 Medical Rec #:  [id number]             Height:       71.0 in Accession #:    [id number]            Weight:       182.5 lb Date of Birth:  [birth year]            BSA:          2.03 m Patient Age:    76 years              BP:           136/81 mmHg Patient Gender: M                     HR:           62 bpm. Exam Location:  Anesthesiology Transesophogeal exam was perform intraoperatively during surgical procedure. Patient was closely monitored under general anesthesia during the entirety of examination. Indications:     I25.110 Atherosclerotic heart disease of native coronary artery                  with unstable angina pectoris Performing Phys: [id number] Eugenio Hoes Diagnosing Phys: Marcene Duos MD Complications: No known complications during this  procedure. POST-OP IMPRESSIONS _ Left Ventricle: The left ventricle is unchanged from pre-bypass. _ Right Ventricle: The right ventricle appears unchanged from pre-bypass. _ Aorta: The aorta appears unchanged from pre-bypass. _ Left Atrium: The left atrium appears unchanged from pre-bypass. _ Left Atrial Appendage: The left atrial appendage appears unchanged from pre-bypass. _ Aortic Valve: The aortic valve appears unchanged from pre-bypass. _ Mitral Valve: The mitral valve appears unchanged from pre-bypass. _ Tricuspid Valve: The tricuspid valve appears unchanged from pre-bypass. _ Pulmonic Valve: The pulmonic valve appears unchanged from pre-bypass. _ Interatrial Septum: The interatrial septum appears unchanged from pre-bypass. _ Interventricular Septum: The interventricular septum appears unchanged from pre-bypass. _ Pericardium: The pericardium appears unchanged from pre-bypass. PRE-OP FINDINGS  Left Ventricle: The left ventricle has normal systolic function, with an ejection fraction of 60-65%. The cavity size was normal. There is mild concentric left ventricular hypertrophy. Right Ventricle: The right ventricle has normal systolic function. The cavity was normal. There is no increase in right ventricular wall thickness. Left Atrium: Left atrial size was normal in size. No left atrial/left atrial appendage thrombus was detected. Right Atrium: Right atrial size was normal in size. Interatrial Septum: No atrial level shunt detected by color flow Doppler. Pericardium: There is no evidence of pericardial effusion. Mitral Valve: The mitral valve is  normal in structure. Mitral valve regurgitation is trivial by color flow Doppler. There is No evidence of mitral stenosis. Ruptured cord on P2. MR trivial. Tricuspid Valve: The tricuspid valve was normal in structure. Tricuspid valve regurgitation was not visualized by color flow Doppler. Aortic Valve: The aortic valve is tricuspid Aortic valve regurgitation was not  visualized by color flow Doppler. There is mild stenosis of the aortic valve, with a calculated valve area of 1.94 cm. There is moderate thickening and mild calcification present on the aortic valve right coronary, left coronary and non-coronary cusps with mildly decreased mobility. Pulmonic Valve: The pulmonic valve was normal in structure, with normal. Pulmonic valve regurgitation is trivial by color flow Doppler. Aorta: The aortic root and ascending aorta are normal in size and structure. There is evidence of plaque in the aortic arch and descending aorta. +--------------+--------++ LEFT VENTRICLE         +--------------+--------++ PLAX 2D                 +------------+---------++ +--------------+--------++  3D Volume EF          LVOT diam:    2.50 cm   +------------+---------++ +--------------+--------++  LV 3D EDV:  109.99 ml LVOT Area:    4.91 cm  +------------+---------++ +--------------+--------++  LV 3D ESV:  70.59 ml                          +------------+---------++ +--------------+--------++ +------------------+------------++ AORTIC VALVE                   +------------------+------------++ AV Area (Vmax):   2.12 cm     +------------------+------------++ AV Area (Vmean):  2.04 cm     +------------------+------------++ AV Area (VTI):    1.94 cm     +------------------+------------++ AV Vmax:          206.00 cm/s  +------------------+------------++ AV Vmean:         151.000 cm/s +------------------+------------++ AV VTI:           0.537 m      +------------------+------------++ AV Peak Grad:     17.0 mmHg    +------------------+------------++ AV Mean Grad:     10.0 mmHg    +------------------+------------++ LVOT Vmax:        88.85 cm/s   +------------------+------------++ LVOT Vmean:       62.600 cm/s  +------------------+------------++ LVOT VTI:         0.212 m      +------------------+------------++ LVOT/AV VTI  ratio:0.39         +------------------+------------++  +--------------+-------+ SHUNTS                +--------------+-------+ Systemic VTI: 0.21 m  +--------------+-------+ Systemic Diam:2.50 cm +--------------+-------+  Marcene Duos MD Electronically signed by Marcene Duos MD Signature Date/Time: 11/14/2022/10:28:40 AM    Final    DG Chest Port 1 View  Result Date: 11/14/2022 CLINICAL DATA:  Status post CABG EXAM: PORTABLE CHEST 1 VIEW COMPARISON:  11/13/2022 FINDINGS: There is a right IJ catheter sheath with tip projecting over the SVC. Status post median sternotomy and CABG procedure. Low lung volumes. Persistent bilateral pleural effusions with bibasilar atelectasis. Subsegmental atelectasis in the right upper lobe appears unchanged. IMPRESSION: Persistent bilateral pleural effusions with bibasilar atelectasis. Electronically Signed   By: Signa Kell M.D.   On: 11/14/2022 08:35   DG Chest Port 1 View  Result Date: 11/13/2022 CLINICAL DATA:  [id number] S/P CABG (coronary artery bypass graft) [id number] EXAM:  PORTABLE CHEST - 1 VIEW COMPARISON:  11/12/2022 FINDINGS: Swan-Ganz has been retracted leaving the right IJ sheath in place. Left chest tube is been removed, with no pneumothorax. Mediastinal drain has been removed. Relatively low lung volumes with some patchy airspace opacities in the bases, left greater than right, marginally increased. CABG markers.  Heart size upper limits normal. Blunting of lateral costophrenic angles suggesting residual effusions. Sternotomy wires. IMPRESSION: Low lung volumes with bibasilar atelectasis and small effusions. Electronically Signed   By: Corlis Leak M.D.   On: 11/13/2022 09:58   DG Chest Port 1 View  Result Date: 11/12/2022 CLINICAL DATA:  Post CABG. EXAM: PORTABLE CHEST 1 VIEW COMPARISON:  11/11/2022 FINDINGS: Post median sternotomy. Mediastinal drain and left-sided chest tube unchanged. Right IJ Swan-Ganz catheter has tip over the main  pulmonary artery segment. Left basilar chest tube unchanged. Lungs are hypoinflated with stable bibasilar opacification likely small amount of bilateral pleural fluid and bibasilar atelectasis. Minimal right midlung linear atelectasis. Stable cardiomegaly. No pneumothorax. Remainder of the exam is unchanged. IMPRESSION: 1. Hypoinflation with stable bibasilar opacification likely small amount of bilateral pleural fluid and bibasilar atelectasis. Minimal right midlung linear atelectasis. 2. Stable cardiomegaly. 3. Support apparatus as described. Electronically Signed   By: Elberta Fortis M.D.   On: 11/12/2022 09:15   DG Chest Port 1 View  Result Date: 11/11/2022 CLINICAL DATA:  [id number] Pneumothorax [id number] EXAM: PORTABLE CHEST 1 VIEW COMPARISON:  CXR 11/10/22 FINDINGS: Endotracheal tube terminates approximately 5 cm above the carina. Status post median sternotomy. Right-sided central venous catheter sheath with unchanged positioning. Mediastinal drain and left-sided thoracostomy tube in place. Enteric tube courses below diaphragm with the tip out of the field of view and side hole projecting over the expected location of the stomach. Small left-sided pleural effusion. No radiographically discernible pneumothorax. Low lung volumes. Cardiomegaly. There is a hazy opacity at the left lung base that could represent a combination of a small pleural effusion and atelectasis, but superimposed infection is not excluded. Visualized upper abdomen is unremarkable IMPRESSION: 1. Lines and tubes as above. 2. Small left-sided pleural effusion. No radiographically discernible pneumothorax. 3. Hazy opacity at the left lung base could represent a combination of a small pleural effusion and atelectasis, but superimposed infection is not excluded. Electronically Signed   By: Lorenza Cambridge M.D.   On: 11/11/2022 08:16   DG Chest 2 View  Result Date: 11/10/2022 CLINICAL DATA:  Preop evaluation EXAM: CHEST - 2 VIEW COMPARISON:   09/29/2022 FINDINGS: The heart size and mediastinal contours are within normal limits. Both lungs are clear. The visualized skeletal structures are unremarkable. IMPRESSION: No active cardiopulmonary disease. Electronically Signed   By: [name] M.D.   On: 11/10/2022 13:55   DG Chest Port 1 View  Result Date: 11/10/2022 CLINICAL DATA:  Status post CABG EXAM: PORTABLE CHEST 1 VIEW COMPARISON:  Previous studies including the examination of 11/09/2022 FINDINGS: Tip of endotracheal tube is 4.9 cm above the carina. NG tube is noted traversing the esophagus. Mediastinal drain and 2 left chest tubes are noted. Tip of right IJ Swan-Ganz catheter is seen in the main pulmonary artery. Transverse diameter of heart is increased. There are linear densities in both parahilar regions and lower lung fields. There is increased density in left lower lung field. There is no pneumothorax. IMPRESSION: Status post CABG. Linear densities in parahilar regions and lower lung fields may suggest subsegmental atelectasis. There is increased density in left lower lung field suggesting pleural effusion. Support  devices as described. Electronically Signed   By: [name] M.D.   On: 11/10/2022 13:54   EP STUDY  Result Date: 11/10/2022 See surgical note for result.  VAS US DOPPLER PRE CABG  Result Date: 11/09/2022 PREOPERATIVE VASCULAR EVALUATION Patient Name:  Treymon Piraino [facility]  Date of Exam:   11/09/2022 Medical Rec #: [id number]              Accession #:    [id number] Date of Birth: 1945/06/27             Patient Gender: M Patient Age:   66 years Exam Location:  [facility] Procedure:      VAS US DOPPLER PRE CABG Referring Phys: Eugenio Hoes --------------------------------------------------------------------------------  Indications:      Pre-CABG. Risk Factors:     Hypertension, hyperlipidemia, past history of smoking, prior                   MI, coronary artery disease, prior CVA. Comparison Study:  07-11-2022 Carotid duplex 1-39% bilaterally. Performing Technologist: Jean Rosenthal RDMS, RVT  Examination Guidelines: A complete evaluation includes B-mode imaging, spectral Doppler, color Doppler, and power Doppler as needed of all accessible portions of each vessel. Bilateral testing is considered an integral part of a complete examination. Limited examinations for reoccurring indications may be performed as noted.  ABI Findings: +--------+------------------+-----+----------+--------+ Right   Rt Pressure (mmHg)IndexWaveform  Comment  +--------+------------------+-----+----------+--------+ [id number]                    triphasic          +--------+------------------+-----+----------+--------+ PTA     108               0.77 monophasic         +--------+------------------+-----+----------+--------+ DP      123               0.87 biphasic           +--------+------------------+-----+----------+--------+ +--------+------------------+-----+----------+-------+ Left    Lt Pressure (mmHg)IndexWaveform  Comment +--------+------------------+-----+----------+-------+ [id number]                    triphasic         +--------+------------------+-----+----------+-------+ PTA     120               0.85 monophasic        +--------+------------------+-----+----------+-------+ DP      75                0.53 monophasic        +--------+------------------+-----+----------+-------+  Right Doppler Findings: +--------+--------+-----+---------+--------+ Site    PressureIndexDoppler  Comments +--------+--------+-----+---------+--------+ [id number]          triphasic         +--------+--------+-----+---------+--------+ Radial               triphasic         +--------+--------+-----+---------+--------+ Ulnar                triphasic         +--------+--------+-----+---------+--------+  Left Doppler Findings: +--------+--------+-----+---------+--------+ Site     PressureIndexDoppler  Comments +--------+--------+-----+---------+--------+ [id number]          triphasic         +--------+--------+-----+---------+--------+ Radial               triphasic         +--------+--------+-----+---------+--------+ Ulnar  triphasic         +--------+--------+-----+---------+--------+   Right ABI: Resting right ankle-brachial index indicates mild right lower extremity arterial disease. Left ABI: Resting left ankle-brachial index indicates mild left lower extremity arterial disease. Right Upper Extremity: Doppler waveforms decrease >50% with right radial compression. Doppler waveforms remain within normal limits with right ulnar compression. Left Upper Extremity: Doppler waveforms remain within normal limits with left radial compression. Doppler waveforms remain within normal limits with left ulnar compression.  Electronically signed by Coral Else MD on 11/09/2022 at 8:36:06 PM.    Final      Discharge Exam: Vitals:   11/27/22 0720 11/27/22 0832  BP:  (!) 113/55  Pulse:  72  Resp:    Temp: 98 F (36.7 C)   SpO2:     Vitals:   11/27/22 0347 11/27/22 0602 11/27/22 0720 11/27/22 0832  BP: 130/68 133/62  (!) 113/55  Pulse: 71   72  Resp: 20     Temp: 98.2 F (36.8 C)  98 F (36.7 C)   TempSrc: Oral  Oral   SpO2: 95%     Weight:      Height:        General: Pt is alert, awake, not in acute distress Cardiovascular: RRR, S1/S2 +, no rubs, no gallops Respiratory: CTA bilaterally, no wheezing, no rhonchi Abdominal: Soft, NT, ND, bowel sounds + Extremities: no edema, no cyanosis    The results of significant diagnostics from this hospitalization (including imaging, microbiology, ancillary and laboratory) are listed below for reference.     Microbiology: Recent Results (from the past 240 hour(s))  SARS Coronavirus 2 by RT PCR (hospital order, performed in [facility] hospital lab) *cepheid single result test* Anterior  Nasal Swab     Status: None   Collection Time: 11/22/22  1:28 PM   Specimen: Anterior Nasal Swab  Result Value Ref Range Status   SARS Coronavirus 2 by RT PCR NEGATIVE NEGATIVE Final    Comment: Performed at [facility], [address] [place], [place] [address]     Labs: BNP (last 3 results) Recent Labs    11/23/22 1829 11/24/22 1055  BNP 369.3* 329.6*   Basic Metabolic Panel: Recent Labs  Lab 11/22/22 1230 11/23/22 1829 11/23/22 2214 11/25/22 0309  NA 134* 137  --  137  K 3.9 4.0  --  3.7  CL 103 100  --  100  CO2 23 25  --  24  GLUCOSE 119* 112*  --  114*  BUN 14 13  --  15  CREATININE 1.26* 1.26* 1.24 1.30*  CALCIUM 8.6* 9.2  --  9.1   Liver Function Tests: Recent Labs  Lab 11/22/22 1230  AST 45*  ALT 27  ALKPHOS 116  BILITOT 1.4*  PROT 5.9*  ALBUMIN 3.2*   No results for input(s): "LIPASE", "AMYLASE" in the last 168 hours. No results for input(s): "AMMONIA" in the last 168 hours. CBC: Recent Labs  Lab 11/22/22 1230 11/23/22 1829 11/23/22 2214 11/24/22 0445 11/25/22 0309  WBC 9.1 10.5 8.4 8.6 8.0  NEUTROABS 7.1 7.7  --   --  5.2  HGB 11.0* 10.8* 10.6* 11.0* 11.1*  HCT 33.1* 32.9* 32.0* 33.2* 33.0*  MCV 91.7 89.9 89.6 90.7 90.2  PLT 295 305 287 289 284   Cardiac Enzymes: No results for input(s): "CKTOTAL", "CKMB", "CKMBINDEX", "TROPONINI" in the last 168 hours. BNP: Invalid input(s): "POCBNP" CBG: Recent Labs  Lab 11/26/22 1237 11/26/22 1542 11/26/22 1712  11/26/22 2207 11/27/22 0720  GLUCAP 121* 128* 126* 184* 128*   D-Dimer No results for input(s): "DDIMER" in the last 72 hours. Hgb A1c No results for input(s): "HGBA1C" in the last 72 hours. Lipid Profile No results for input(s): "CHOL", "HDL", "LDLCALC", "TRIG", "CHOLHDL", "LDLDIRECT" in the last 72 hours. Thyroid function studies No results for input(s): "TSH", "T4TOTAL", "T3FREE", "THYROIDAB" in the last 72 hours.  Invalid input(s): "FREET3" Anemia work up No results  for input(s): "VITAMINB12", "FOLATE", "FERRITIN", "TIBC", "IRON", "RETICCTPCT" in the last 72 hours. Urinalysis    Component Value Date/Time   COLORURINE YELLOW 11/09/2022 1211   APPEARANCEUR CLEAR 11/09/2022 1211   LABSPEC 1.010 11/09/2022 1211   PHURINE 7.0 11/09/2022 1211   GLUCOSEU NEGATIVE 11/09/2022 1211   GLUCOSEU NEGATIVE 06/03/2022 0849   HGBUR NEGATIVE 11/09/2022 1211   BILIRUBINUR NEGATIVE 11/09/2022 1211   KETONESUR NEGATIVE 11/09/2022 1211   PROTEINUR NEGATIVE 11/09/2022 1211   UROBILINOGEN 0.2 06/03/2022 0849   NITRITE NEGATIVE 11/09/2022 1211   LEUKOCYTESUR NEGATIVE 11/09/2022 1211   Sepsis Labs Recent Labs  Lab 11/23/22 1829 11/23/22 2214 11/24/22 0445 11/25/22 0309  WBC 10.5 8.4 8.6 8.0   Microbiology Recent Results (from the past 240 hour(s))  SARS Coronavirus 2 by RT PCR (hospital order, performed in [facility] hospital lab) *cepheid single result test* Anterior Nasal Swab     Status: None   Collection Time: 11/22/22  1:28 PM   Specimen: Anterior Nasal Swab  Result Value Ref Range Status   SARS Coronavirus 2 by RT PCR NEGATIVE NEGATIVE Final    Comment: Performed at [facility], [address], [place], [place] [address]    FURTHER DISCHARGE INSTRUCTIONS:   Get Medicines reviewed and adjusted: Please take all your medications with you for your next visit with your Primary MD   Laboratory/radiological data: Please request your Primary MD to go over all hospital tests and procedure/radiological results at the follow up, please ask your Primary MD to get all Hospital records sent to his/her office.   In some cases, they will be blood work, cultures and biopsy results pending at the time of your discharge. Please request that your primary care M.D. goes through all the records of your hospital data and follows up on these results.   Also Note the following: If you experience worsening of your admission symptoms, develop shortness of breath, life  threatening emergency, suicidal or homicidal thoughts you must seek medical attention immediately by calling 911 or calling your MD immediately  if symptoms less severe.   You must read complete instructions/literature along with all the possible adverse reactions/side effects for all the Medicines you take and that have been prescribed to you. Take any new Medicines after you have completely understood and accpet all the possible adverse reactions/side effects.    Do not drive when taking Pain medications or sleeping medications (Benzodaizepines)   Do not take more than prescribed Pain, Sleep and Anxiety Medications. It is not advisable to combine anxiety,sleep and pain medications without talking with your primary care practitioner   Special Instructions: If you have smoked or chewed Tobacco  in the last 2 yrs please stop smoking, stop any regular Alcohol  and or any Recreational drug use.   Wear Seat belts while driving.   Please note: You were cared for by a hospitalist during your hospital stay. Once you are discharged, your primary care physician will handle any further medical issues. Please note that NO REFILLS for any  discharge medications will be authorized once you are discharged, as it is imperative that you return to your primary care physician (or establish a relationship with a primary care physician if you do not have one) for your post hospital discharge needs so that they can reassess your need for medications and monitor your lab values  Time coordinating discharge: Over 30 minutes  SIGNED:   Hughie Closs, MD  [facility] 11/27/2022, 10:54 AM *Please note that this is a verbal dictation therefore any spelling or grammatical errors are due to the "Dragon [employer] One" system interpretation. If 7PM-7AM, please contact night-coverage [email or web address]

## 2022-11-29 ENCOUNTER — Telehealth: Payer: Self-pay | Admitting: Cardiovascular Disease

## 2022-11-29 NOTE — Telephone Encounter (Signed)
Advised this is F/U from hospital and ED and he needs to be seen to go over medication changes and any other issues. She states understanding and he will be at F/U appt.

## 2022-11-29 NOTE — Telephone Encounter (Signed)
Calling to see if the appt on Thursday is needed, because he see the surgeon next week. Please advise

## 2022-12-01 ENCOUNTER — Ambulatory Visit: Payer: 59 | Attending: General Practice | Admitting: General Practice

## 2022-12-01 ENCOUNTER — Encounter: Payer: Self-pay | Admitting: General Practice

## 2022-12-01 VITALS — BP 98/58 | HR 67 | Ht 72.0 in | Wt 166.4 lb

## 2022-12-01 DIAGNOSIS — E1165 Type 2 diabetes mellitus with hyperglycemia: Secondary | ICD-10-CM

## 2022-12-01 DIAGNOSIS — E785 Hyperlipidemia, unspecified: Secondary | ICD-10-CM

## 2022-12-01 DIAGNOSIS — I25118 Atherosclerotic heart disease of native coronary artery with other forms of angina pectoris: Secondary | ICD-10-CM | POA: Diagnosis not present

## 2022-12-01 DIAGNOSIS — I4891 Unspecified atrial fibrillation: Secondary | ICD-10-CM | POA: Diagnosis not present

## 2022-12-01 DIAGNOSIS — Z951 Presence of aortocoronary bypass graft: Secondary | ICD-10-CM | POA: Diagnosis not present

## 2022-12-01 DIAGNOSIS — I3139 Other pericardial effusion (noninflammatory): Secondary | ICD-10-CM

## 2022-12-01 DIAGNOSIS — Z7984 Long term (current) use of oral hypoglycemic drugs: Secondary | ICD-10-CM

## 2022-12-01 DIAGNOSIS — I10 Essential (primary) hypertension: Secondary | ICD-10-CM

## 2022-12-01 NOTE — Patient Instructions (Addendum)
Medication Instructions:  - No changes  *If you need a refill on your cardiac medications before your next appointment, please call your pharmacy*   Lab Work: - None ordered   Testing/Procedures: - None ordered   Follow-Up: At [facility], you and your health needs are our priority.  As part of our continuing mission to provide you with exceptional heart care, we have created designated Provider Care Teams.  These Care Teams include your primary Cardiologist (physician) and Advanced Practice Providers (APPs -  Physician Assistants and Nurse Practitioners) who all work together to provide you with the care you need, when you need it.    Your next appointment:   Keep appointment on Mar 02, 2023   Provider:   Reatha Harps, MD     Other Instructions - Maintain sternal precautions. - Increase activity as tolerated.

## 2022-12-05 ENCOUNTER — Other Ambulatory Visit: Payer: Self-pay | Admitting: Thoracic Surgery (Cardiothoracic Vascular Surgery)

## 2022-12-05 DIAGNOSIS — Z951 Presence of aortocoronary bypass graft: Secondary | ICD-10-CM

## 2022-12-05 NOTE — Progress Notes (Deleted)
[address]       [place] [address]             [phone]     HPI: [name] is a 77 year old male with a past medical history of hypertension, mild aortic stenosis, CVA, chronic diastolic CHF, NSTEMI, aortic atherosclerosis, pericardial effusion, community-acquired pneumonia, type 2 diabetes, CKD stage IIIa, anxiety, insomnia, and hyperlipidemia. He returns for routine postoperative follow-up having undergone CABG x 5 by Dr. Leafy Ro on 06/27. The patient's early postoperative recovery while in the hospital was notable for early postoperative thrombocytopenia and AKI on CKD that resolved with time as well as atrial fibrillation with RVR which converted to NSR with Amiodarone. He was severely deconditioned and was discharged in stable condition to a SNF on 07/08. On 07/10 he was brought to the ED from SNF due to a 2 day history of hemoptysis. CTA was negative for PE and echocardiogram showed a mild pericardial effusion and small bilateral pleural effusions, he was seen by Dr. Dorris Fetch and it was felt this was expected and did not require further intervention. The hospitalist service continued to monitor him and he was discharged in stable condition back to SNF on 07/14. He was seen by cardiology on 07/18 where he was felt to be doing well, his chest tube sutures were removed and no medication changes were made.   Since hospital discharge the patient reports ***. Chest pain, SOB, dizziness, LOC.    Current Outpatient Medications  Medication Sig Dispense Refill   amiodarone (PACERONE) 200 MG tablet Take 1 tablet (200 mg total) by mouth 2 (two) times daily. X 7 days, then decrease to 200 mg daily     aspirin EC 325 MG tablet Take 1 tablet (325 mg total) by mouth daily.     atorvastatin (LIPITOR) 80 MG tablet Take 1 tablet (80 mg total) by mouth daily. 30 tablet 2   cholecalciferol (VITAMIN D3) 25 MCG (1000 UNIT) tablet Take 1 tablet (1,000 Units total) by mouth daily.  Take 1,000 Units by mouth daily. 90 tablet 2   Evolocumab (REPATHA SURECLICK) 140 MG/ML SOAJ Inject 140 mg into the skin every 14 days. 6 mL 3   fenofibrate 160 MG tablet Take 1 tablet (160 mg total) by mouth daily. 90 tablet 1   fluticasone (FLONASE) 50 MCG/ACT nasal spray Place 2 sprays into both nostrils daily. 48 g 1   gabapentin (NEURONTIN) 300 MG capsule Take 1 capsule (300 mg total) by mouth at bedtime. 90 capsule 3   hydrALAZINE (APRESOLINE) 25 MG tablet Take 1 tablet (25 mg total) by mouth every 8 (eight) hours. 90 tablet 2   losartan (COZAAR) 100 MG tablet Take 1 tablet (100 mg total) by mouth daily. 180 tablet 1   magnesium oxide (MAG-OX) 400 (240 Mg) MG tablet Take 400 mg by mouth daily. (Patient not taking: Reported on 12/01/2022)     Melatonin 10 MG TABS Take 10 mg by mouth at bedtime.     metFORMIN (GLUCOPHAGE-XR) 500 MG 24 hr tablet Take 1 tablet (500 mg) by mouth daily with breakfast. 180 tablet 1   metoprolol tartrate (LOPRESSOR) 25 MG tablet Take 1 tablet (25 mg total) by mouth 2 (two) times daily. 60 tablet 1   nitroGLYCERIN (NITROSTAT) 0.4 MG SL tablet Place 1 tablet under the tongue every 5 minutes as needed for chest pain. 25 tablet 3   PARoxetine (PAXIL) 40 MG tablet Take 1 tablet (40 mg  total) by mouth every morning. 90 tablet 1   vitamin E 180 MG (400 UNITS) capsule Take 400 Units by mouth daily.     No current facility-administered medications for this visit.   Vitals:  Physical Exam: ***  Diagnostic Tests: ***  Impression/Plan: *** S/P CABG x 5  Jenny Reichmann, PA-C [facility] [phone]

## 2022-12-06 ENCOUNTER — Encounter: Payer: Self-pay | Admitting: Internal Medicine

## 2022-12-06 ENCOUNTER — Ambulatory Visit
Admission: RE | Admit: 2022-12-06 | Discharge: 2022-12-06 | Disposition: A | Discharge status: Home / Self Care | Payer: 59 | Source: Ambulatory Visit | Attending: Thoracic Surgery (Cardiothoracic Vascular Surgery) | Admitting: Thoracic Surgery (Cardiothoracic Vascular Surgery)

## 2022-12-06 ENCOUNTER — Ambulatory Visit (INDEPENDENT_AMBULATORY_CARE_PROVIDER_SITE_OTHER): Payer: 59 | Admitting: Physician Assistant

## 2022-12-06 ENCOUNTER — Ambulatory Visit (INDEPENDENT_AMBULATORY_CARE_PROVIDER_SITE_OTHER): Payer: 59

## 2022-12-06 ENCOUNTER — Ambulatory Visit: Payer: Self-pay

## 2022-12-06 ENCOUNTER — Ambulatory Visit (INDEPENDENT_AMBULATORY_CARE_PROVIDER_SITE_OTHER): Payer: 59 | Admitting: Internal Medicine

## 2022-12-06 ENCOUNTER — Ambulatory Visit: Payer: 59 | Admitting: Internal Medicine

## 2022-12-06 ENCOUNTER — Telehealth: Payer: Self-pay | Admitting: Internal Medicine

## 2022-12-06 ENCOUNTER — Encounter: Payer: Self-pay | Admitting: Physician Assistant

## 2022-12-06 VITALS — BP 123/64 | HR 75 | Resp 18 | Ht 72.0 in | Wt 166.0 lb

## 2022-12-06 VITALS — BP 122/70 | HR 67 | Temp 98.1°F | Ht 72.0 in | Wt 164.0 lb

## 2022-12-06 DIAGNOSIS — I5032 Chronic diastolic (congestive) heart failure: Secondary | ICD-10-CM

## 2022-12-06 DIAGNOSIS — E559 Vitamin D deficiency, unspecified: Secondary | ICD-10-CM | POA: Diagnosis not present

## 2022-12-06 DIAGNOSIS — Z951 Presence of aortocoronary bypass graft: Secondary | ICD-10-CM

## 2022-12-06 DIAGNOSIS — Z7984 Long term (current) use of oral hypoglycemic drugs: Secondary | ICD-10-CM

## 2022-12-06 DIAGNOSIS — I10 Essential (primary) hypertension: Secondary | ICD-10-CM | POA: Diagnosis not present

## 2022-12-06 DIAGNOSIS — E785 Hyperlipidemia, unspecified: Secondary | ICD-10-CM | POA: Diagnosis not present

## 2022-12-06 DIAGNOSIS — I2510 Atherosclerotic heart disease of native coronary artery without angina pectoris: Secondary | ICD-10-CM

## 2022-12-06 DIAGNOSIS — Z0000 Encounter for general adult medical examination without abnormal findings: Secondary | ICD-10-CM

## 2022-12-06 DIAGNOSIS — E1165 Type 2 diabetes mellitus with hyperglycemia: Secondary | ICD-10-CM

## 2022-12-06 DIAGNOSIS — N1831 Chronic kidney disease, stage 3a: Secondary | ICD-10-CM

## 2022-12-06 LAB — HEPATIC FUNCTION PANEL
ALT: 16 U/L (ref 0–53)
AST: 22 U/L (ref 0–37)
Albumin: 4.2 g/dL (ref 3.5–5.2)
Alkaline Phosphatase: 123 U/L — ABNORMAL HIGH (ref 39–117)
Bilirubin, Direct: 0.2 mg/dL (ref 0.0–0.3)
Total Bilirubin: 0.9 mg/dL (ref 0.2–1.2)
Total Protein: 7.1 g/dL (ref 6.0–8.3)

## 2022-12-06 LAB — CBC WITH DIFFERENTIAL/PLATELET
Basophils Absolute: 0.1 10*3/uL (ref 0.0–0.1)
Basophils Relative: 1.1 % (ref 0.0–3.0)
Eosinophils Absolute: 0.8 10*3/uL — ABNORMAL HIGH (ref 0.0–0.7)
Eosinophils Relative: 11.7 % — ABNORMAL HIGH (ref 0.0–5.0)
HCT: 38.6 % — ABNORMAL LOW (ref 39.0–52.0)
Hemoglobin: 12.7 g/dL — ABNORMAL LOW (ref 13.0–17.0)
Lymphocytes Relative: 17.4 % (ref 12.0–46.0)
Lymphs Abs: 1.3 10*3/uL (ref 0.7–4.0)
MCHC: 32.8 g/dL (ref 30.0–36.0)
MCV: 90.7 fl (ref 78.0–100.0)
Monocytes Absolute: 0.9 10*3/uL (ref 0.1–1.0)
Monocytes Relative: 12.5 % — ABNORMAL HIGH (ref 3.0–12.0)
Neutro Abs: 4.2 10*3/uL (ref 1.4–7.7)
Neutrophils Relative %: 57.3 % (ref 43.0–77.0)
Platelets: 229 10*3/uL (ref 150.0–400.0)
RBC: 4.25 Mil/uL (ref 4.22–5.81)
RDW: 16.5 % — ABNORMAL HIGH (ref 11.5–15.5)
WBC: 7.3 10*3/uL (ref 4.0–10.5)

## 2022-12-06 LAB — BASIC METABOLIC PANEL
BUN: 15 mg/dL (ref 6–23)
CO2: 25 mEq/L (ref 19–32)
Calcium: 10.1 mg/dL (ref 8.4–10.5)
Chloride: 101 mEq/L (ref 96–112)
Creatinine, Ser: 1.53 mg/dL — ABNORMAL HIGH (ref 0.40–1.50)
GFR: 43.51 mL/min — ABNORMAL LOW (ref >60.00)
Glucose, Bld: 100 mg/dL — ABNORMAL HIGH (ref 70–99)
Potassium: 4.7 mEq/L (ref 3.5–5.1)
Sodium: 135 mEq/L (ref 135–145)

## 2022-12-06 LAB — VITAMIN D 25 HYDROXY (VIT D DEFICIENCY, FRACTURES): VITD: 79.43 ng/mL (ref 30.00–100.00)

## 2022-12-06 LAB — HEMOGLOBIN A1C: Hgb A1c MFr Bld: 5.5 % (ref 4.6–6.5)

## 2022-12-06 NOTE — Assessment & Plan Note (Signed)
BP Readings from Last 3 Encounters:  12/06/22 123/64  12/06/22 122/70  12/06/22 122/70   Stable, pt to continue medical treatment hydralazine 25 tid, losartan 100 every day, lopressor 25 bid

## 2022-12-06 NOTE — Assessment & Plan Note (Signed)
Stable volume, cont current med tx 

## 2022-12-06 NOTE — Patient Instructions (Signed)
You are encouraged to enroll and participate in the outpatient cardiac rehab program beginning as soon as practical.  You may return to driving an automobile as long as you are no longer requiring oral narcotic pain relievers during the daytime.  It would be wise to start driving only short distances during the daylight and gradually increase from there as you feel comfortable.  Continue to avoid any heavy lifting or strenuous use of your arms or shoulders for at least a total of three months from the time of surgery.  After three months you may gradually increase how much you lift or otherwise use your arms or chest as tolerated, with limits based upon whether or not activities lead to the return of significant discomfort. 

## 2022-12-06 NOTE — Progress Notes (Signed)
Subjective:   Collin Bridges is a 77 y.o. male who presents for an Initial Medicare Annual Wellness Visit.  Visit Complete: In person  Patient Medicare AWV questionnaire was completed by the patient on 12/06/2022; I have confirmed that all information answered by patient is correct and no changes since this date.  Review of Systems     Cardiac Risk Factors include: advanced age (>58men, >67 women);dyslipidemia;family history of premature cardiovascular disease;hypertension;male gender     Objective:    Today's Vitals   12/06/22 1223 12/06/22 1225  BP: 122/70   Pulse: 67   Temp: 98.1 F (36.7 C)   Weight: 164 lb (74.4 kg)   Height: 6' (1.829 m)   PainSc: 0-No pain 0-No pain   Body mass index is 22.24 kg/m.     12/06/2022   12:28 PM 11/24/2022    2:00 PM 11/23/2022    4:38 PM 11/22/2022    3:49 PM 11/22/2022   11:40 AM 11/10/2022    6:23 AM 11/09/2022   10:48 AM  Advanced Directives  Does Patient Have a Medical Advance Directive? Yes Yes Yes No Yes Yes Yes  Type of Social research officer, government Power of [employer] Power of [place];Living will Healthcare Power of [place];Living will Healthcare Power of [place];Living will  Does patient want to make changes to medical advance directive?  No - Patient declined  No - Patient declined No - Patient declined    Copy of Healthcare Power of Attorney in Chart? No - copy requested No - copy requested No - copy requested, Physician notified  No - copy available, Physician notified No - copy requested No - copy requested    Current Medications (verified) Outpatient Encounter Medications as of 12/06/2022  Medication Sig   amiodarone (PACERONE) 200 MG tablet Take 1 tablet (200 mg total) by mouth 2 (two) times daily. X 7 days, then decrease to 200 mg daily   aspirin EC 325 MG tablet Take 1 tablet (325 mg total) by mouth daily.   atorvastatin (LIPITOR) 80 MG tablet  Take 1 tablet (80 mg total) by mouth daily.   cholecalciferol (VITAMIN D3) 25 MCG (1000 UNIT) tablet Take 1 tablet (1,000 Units total) by mouth daily. Take 1,000 Units by mouth daily.   Evolocumab (REPATHA SURECLICK) 140 MG/ML SOAJ Inject 140 mg into the skin every 14 days.   fenofibrate 160 MG tablet Take 1 tablet (160 mg total) by mouth daily.   fluticasone (FLONASE) 50 MCG/ACT nasal spray Place 2 sprays into both nostrils daily.   gabapentin (NEURONTIN) 300 MG capsule Take 1 capsule (300 mg total) by mouth at bedtime.   hydrALAZINE (APRESOLINE) 25 MG tablet Take 1 tablet (25 mg total) by mouth every 8 (eight) hours.   losartan (COZAAR) 100 MG tablet Take 1 tablet (100 mg total) by mouth daily.   magnesium oxide (MAG-OX) 400 (240 Mg) MG tablet Take 400 mg by mouth daily.   Melatonin 10 MG TABS Take 10 mg by mouth at bedtime.   metFORMIN (GLUCOPHAGE-XR) 500 MG 24 hr tablet Take 1 tablet (500 mg) by mouth daily with breakfast.   metoprolol tartrate (LOPRESSOR) 25 MG tablet Take 1 tablet (25 mg total) by mouth 2 (two) times daily.   nitroGLYCERIN (NITROSTAT) 0.4 MG SL tablet Place 1 tablet under the tongue every 5 minutes as needed for chest pain.   PARoxetine (PAXIL) 40 MG tablet Take 1 tablet (40 mg total) by mouth  every morning.   vitamin E 180 MG (400 UNITS) capsule Take 400 Units by mouth daily.   No facility-administered encounter medications on file as of 12/06/2022.    Allergies (verified) Patient has no known allergies.   History: Past Medical History:  Diagnosis Date   Allergy    seasonal   Anginal pain (HCC)    Anxiety    CHF (congestive heart failure) (HCC)    Coronary artery disease    Depression    Diabetes mellitus without complication (HCC)    Type 2   GERD (gastroesophageal reflux disease)    Pt states "take ambien for this"   Hepatitis C    HLD (hyperlipidemia) 08/09/2017   Hypertension    Hypoglycemia    Pneumonia 06/2022   Stroke (HCC) 03/2021   TIA    Past Surgical History:  Procedure Laterality Date   APPENDECTOMY     BUBBLE STUDY  06/16/2021   Procedure: BUBBLE STUDY;  Surgeon: Sande Rives, MD;  Location: [facility] ENDOSCOPY;  Service: Cardiovascular;;   COLONOSCOPY     CORONARY ARTERY BYPASS GRAFT N/A 11/10/2022   Procedure: CORONARY ARTERY BYPASS GRAFTING (CABG) X5 USING LEFT INTERNAL MAMMARY ARTERY (LIMA) AND ENDOSCOPICALLY HARVESTED RIGHT AND LEFT GREATER SAPHENOUS VEIN;  Surgeon: Eugenio Hoes, MD;  Location: [facility] OR;  Service: Open Heart Surgery;  Laterality: N/A;   FRACTURE SURGERY Left    Ankle   LEFT HEART CATH AND CORONARY ANGIOGRAPHY N/A 07/12/2022   Procedure: LEFT HEART CATH AND CORONARY ANGIOGRAPHY;  Surgeon: Marykay Lex, MD;  Location: [facility] INVASIVE CV LAB;  Service: Cardiovascular;  Laterality: N/A;   TEE WITHOUT CARDIOVERSION N/A 06/16/2021   Procedure: TRANSESOPHAGEAL ECHOCARDIOGRAM (TEE);  Surgeon: Sande Rives, MD;  Location[facility] ENDOSCOPY;  Service: Cardiovascular;  Laterality: N/A;   TEE WITHOUT CARDIOVERSION N/A 11/10/2022   Procedure: TRANSESOPHAGEAL ECHOCARDIOGRAM;  Surgeon: Eugenio Hoes, MD;  Location: [facility] OR;  Service: Open Heart Surgery;  Laterality: N/A;   TONSILECTOMY, ADENOIDECTOMY, BILATERAL MYRINGOTOMY AND TUBES  1955   Family History  Problem Relation Age of Onset   Lung disease Mother    Hyperlipidemia Father    Hypertension Father    Social History   Socioeconomic History   Marital status: Single    Spouse name: Not on file   Number of children: 0   Years of education: some college   Highest education level: Associate degree: occupational, Scientist, product/process development, or vocational program  Occupational History   Occupation: retired  Tobacco Use   Smoking status: Former   Smokeless tobacco: Never   Tobacco comments:    quit 72yrs ago, smoke a cigar occassionally.  Vaping Use   Vaping status: Never Used  Substance and Sexual Activity   Alcohol use: Not Currently    Comment: Quit 30 years  ago   Drug use: No   Sexual activity: Yes  Other Topics Concern   Not on file  Social History Narrative   Lives alone.   Right-handed.   Two cups caffeine per day.   Social Determinants of Health   Financial Resource Strain: Low Risk  (12/06/2022)   Overall Financial Resource Strain (CARDIA)    Difficulty of Paying Living Expenses: Not very hard  Food Insecurity: Food Insecurity Present (12/06/2022)   Hunger Vital Sign    Worried About Running Out of Food in the Last Year: Sometimes true    Ran Out of Food in the Last Year: Never true  Transportation Needs: No Transportation Needs (12/06/2022)  PRAPARE - Administrator, Civil Service (Medical): No    Lack of Transportation (Non-Medical): No  Physical Activity: Sufficiently Active (12/06/2022)   Exercise Vital Sign    Days of Exercise per Week: 7 days    Minutes of Exercise per Session: 40 min  Recent Concern: Physical Activity - Insufficiently Active (09/29/2022)   Exercise Vital Sign    Days of Exercise per Week: 1 day    Minutes of Exercise per Session: 10 min  Stress: Stress Concern Present (12/06/2022)   [employer] of Occupational Health - Occupational Stress Questionnaire    Feeling of Stress : To some extent  Social Connections: Socially Isolated (12/06/2022)   Social Connection and Isolation Panel [NHANES]    Frequency of Communication with Friends and Family: Twice a week    Frequency of Social Gatherings with Friends and Family: Once a week    Attends Religious Services: Never    Database administrator or Organizations: No    Attends Banker Meetings: Never    Marital Status: Divorced    Tobacco Counseling Counseling given: Not Answered Tobacco comments: quit 94yrs ago, smoke a cigar occassionally.   Clinical Intake:  Pre-visit preparation completed: Yes  Pain : No/denies pain Pain Score: 0-No pain     BMI - recorded: 22.24 Nutritional Status: BMI of 19-24   Normal Nutritional Risks: None Diabetes: No  How often do you need to have someone help you when you read instructions, pamphlets, or other written materials from your doctor or pharmacy?: 1 - Never What is the last grade level you completed in school?: College Graduate  Interpreter Needed?: No  Information entered by :: Kalin Amrhein N. Yuritzi Kamp, LPN.   Activities of Daily Living    12/06/2022   12:28 PM 12/06/2022   10:23 AM  In your present state of health, do you have any difficulty performing the following activities:  Hearing? 0 0  Vision? 1 1  Difficulty concentrating or making decisions? 0 0  Walking or climbing stairs? 0 0  Dressing or bathing? 0 0  Doing errands, shopping? 0 0  Preparing Food and eating ? N N  Using the Toilet? N N  In the past six months, have you accidently leaked urine? Y Y  Do you have problems with loss of bowel control? N N  Managing your Medications? N N  Managing your Finances? N N  Housekeeping or managing your Housekeeping? N N    Patient Care Team: Corwin Levins, MD as PCP - General (Internal Medicine) O'Neal, Ronnald Ramp, MD as PCP - Cardiology (Cardiology) Eugenio Hoes, MD as Consulting Physician (Cardiothoracic Surgery)  Indicate any recent Medical Services you may have received from other than [facility] providers in the past year (date may be approximate).     Assessment:   This is a routine wellness examination for Lacey.  Hearing/Vision screen Hearing Screening - Comments:: Denies hearing difficulties   Vision Screening - Comments:: Wears rx glasses - up to date with routine eye exams with    Dietary issues and exercise activities discussed:     Goals Addressed             This Visit's Progress    Client understands the importance of follow-up with providers by attending scheduled visits        Depression Screen    12/06/2022   12:26 PM 12/06/2022   10:57 AM 07/18/2022    3:23 PM 06/03/2022    8:27  AM 06/03/2022    8:10  AM 05/18/2022    2:01 PM 05/27/2021    8:28 AM  PHQ 2/9 Scores  PHQ - 2 Score 0 0 3 0 0 0 1  PHQ- 9 Score 0  16        Fall Risk    12/06/2022   12:28 PM 12/06/2022   10:55 AM 12/06/2022   10:23 AM 07/18/2022    3:24 PM 06/03/2022    8:27 AM  Fall Risk   Falls in the past year? 0 0 0 1 0  Number falls in past yr: 0 0 0 0 0  Injury with Fall? 0 0 0 1 0  Risk for fall due to : No Fall Risks No Fall Risks  Impaired balance/gait;Impaired mobility   Follow up Falls prevention discussed Falls evaluation completed  Education provided;Falls evaluation completed     MEDICARE RISK AT HOME:  Medicare Risk at Home - 12/06/22 1229     Any stairs in or around the home? Yes    If so, are there any without handrails? No    Home free of loose throw rugs in walkways, pet beds, electrical cords, etc? Yes    Adequate lighting in your home to reduce risk of falls? Yes    Life alert? No    Use of a cane, walker or w/c? Yes    Grab bars in the bathroom? Yes    Shower chair or bench in shower? Yes    Elevated toilet seat or a handicapped toilet? Yes             TIMED UP AND GO:  Was the test performed? Yes  Length of time to ambulate 10 feet: 12 sec Gait slow and steady with assistive device    Cognitive Function:      08/30/2022    2:11 PM 02/14/2022    8:51 AM  Montreal Cognitive Assessment   Visuospatial/ Executive (0/5) 4 3  Naming (0/3) 3 3  Attention: Read list of digits (0/2) 1 2  Attention: Read list of letters (0/1) 1 1  Attention: Serial 7 subtraction starting at 100 (0/3) 3 3  Language: Repeat phrase (0/2) 2 1  Language : Fluency (0/1) 0 1  Abstraction (0/2) 2 2  Delayed Recall (0/5) 0 0  Orientation (0/6) 6 6  Total 22 22      12/06/2022   12:29 PM  6CIT Screen  What Year? 0 points  What month? 0 points  What time? 0 points  Count back from 20 0 points  Months in reverse 0 points  Repeat phrase 0 points  Total Score 0 points    Immunizations Immunization  History  Administered Date(s) Administered   DTaP 05/17/2007   Fluad Quad(high Dose 65+) 03/22/2021   Influenza, High Dose Seasonal PF 02/11/2016   Influenza,inj,Quad PF,6+ Mos 08/09/2017   Influenza-Unspecified 05/22/2020, 03/01/2022   Moderna Sars-Covid-2 Vaccination 09/11/2019, 10/09/2019   PNEUMOCOCCAL CONJUGATE-20 07/12/2022   Pneumococcal Conjugate-13 07/29/2016   Pneumococcal Polysaccharide-23 05/16/2009, 10/03/2019   RSV,unspecified 05/02/2022   Td 05/18/2022   Tdap 10/03/2019   Zoster Recombinant(Shingrix) 04/18/2022    TDAP status: Up to date  Flu Vaccine status: Up to date  Pneumococcal vaccine status: Up to date  Covid-19 vaccine status: Completed vaccines  Qualifies for Shingles Vaccine? Yes   Zostavax completed No   Shingrix Completed?: Yes  Screening Tests Health Maintenance  Topic Date Due   Zoster Vaccines- Shingrix (2 of 2) 06/13/2022  OPHTHALMOLOGY EXAM  07/22/2022   INFLUENZA VACCINE  12/15/2022   HEMOGLOBIN A1C  05/11/2023   Diabetic kidney evaluation - Urine ACR  06/04/2023   FOOT EXAM  06/04/2023   Diabetic kidney evaluation - eGFR measurement  11/25/2023   Medicare Annual Wellness (AWV)  12/06/2023   DTaP/Tdap/Td (4 - Td or Tdap) 05/18/2032   Pneumonia Vaccine 52+ Years old  Completed   Hepatitis C Screening  Completed   HPV VACCINES  Aged Out   Colonoscopy  Discontinued   COVID-19 Vaccine  Discontinued    Health Maintenance  Health Maintenance Due  Topic Date Due   Zoster Vaccines- Shingrix (2 of 2) 06/13/2022   OPHTHALMOLOGY EXAM  07/22/2022    Colorectal cancer screening: Type of screening: Colonoscopy. Completed 02/15/2022. Repeat every 1 years  Lung Cancer Screening: (Low Dose CT Chest recommended if Age 81-80 years, 20 pack-year currently smoking OR have quit w/in 15years.) does not qualify.   Lung Cancer Screening Referral: no  Additional Screening:  Hepatitis C Screening: does qualify; Completed 08/09/2017  Vision  Screening: Recommended annual ophthalmology exams for early detection of glaucoma and other disorders of the eye. Is the patient up to date with their annual eye exam?  Yes  Who is the provider or what is the name of the office in which the patient attends annual eye exams? Patient could not remember name of new ophthalmologist. If pt is not established with a provider, would they like to be referred to a provider to establish care? No .   Dental Screening: Recommended annual dental exams for proper oral hygiene  Diabetic Foot Exam: N/A  Community Resource Referral / Chronic Care Management: CRR required this visit?  No   CCM required this visit?  No    Plan:     I have personally reviewed and noted the following in the patient's chart:   Medical and social history Use of alcohol, tobacco or illicit drugs  Current medications and supplements including opioid prescriptions. Patient is not currently taking opioid prescriptions. Functional ability and status Nutritional status Physical activity Advanced directives List of other physicians Hospitalizations, surgeries, and ER visits in previous 12 months Vitals Screenings to include cognitive, depression, and falls Referrals and appointments  In addition, I have reviewed and discussed with patient certain preventive protocols, quality metrics, and best practice recommendations. A written personalized care plan for preventive services as well as general preventive health recommendations were provided to patient.     Mickeal Needy, LPN   4/40/1027   After Visit Summary: Mailed to patient.  Nurse Notes: Normal cognitive status assessed by direct observation by this Nurse Health Advisor. No abnormalities found.

## 2022-12-06 NOTE — Patient Instructions (Signed)
Collin Bridges , Thank you for taking time to come for your Medicare Wellness Visit. I appreciate your ongoing commitment to your health goals. Please review the following plan we discussed and let me know if I can assist you in the future.   These are the goals we discussed:  Goals      Client understands the importance of follow-up with providers by attending scheduled visits        This is a list of the screening recommended for you and due dates:  Health Maintenance  Topic Date Due   Eye exam for diabetics  07/22/2022   Flu Shot  12/15/2022   Hemoglobin A1C  05/11/2023   Yearly kidney health urinalysis for diabetes  06/04/2023   Complete foot exam   06/04/2023   Yearly kidney function blood test for diabetes  11/25/2023   Medicare Annual Wellness Visit  12/06/2023   DTaP/Tdap/Td vaccine (4 - Td or Tdap) 05/18/2032   Pneumonia Vaccine  Completed   Hepatitis C Screening  Completed   Zoster (Shingles) Vaccine  Completed   HPV Vaccine  Aged Out   Colon Cancer Screening  Discontinued   COVID-19 Vaccine  Discontinued    Advanced directives: Yes  Conditions/risks identified: Yes  Next appointment: Follow up in one year for your annual wellness visit.   Preventive Care 73 Years and Older, Male  Preventive care refers to lifestyle choices and visits with your health care provider that can promote health and wellness. What does preventive care include? A yearly physical exam. This is also called an annual well check. Dental exams once or twice a year. Routine eye exams. Ask your health care provider how often you should have your eyes checked. Personal lifestyle choices, including: Daily care of your teeth and gums. Regular physical activity. Eating a healthy diet. Avoiding tobacco and drug use. Limiting alcohol use. Practicing safe sex. Taking low doses of aspirin every day. Taking vitamin and mineral supplements as recommended by your health care provider. What happens  during an annual well check? The services and screenings done by your health care provider during your annual well check will depend on your age, overall health, lifestyle risk factors, and family history of disease. Counseling  Your health care provider may ask you questions about your: Alcohol use. Tobacco use. Drug use. Emotional well-being. Home and relationship well-being. Sexual activity. Eating habits. History of falls. Memory and ability to understand (cognition). Work and work Astronomer. Screening  You may have the following tests or measurements: Height, weight, and BMI. Blood pressure. Lipid and cholesterol levels. These may be checked every 5 years, or more frequently if you are over 69 years old. Skin check. Lung cancer screening. You may have this screening every year starting at age 85 if you have a 30-pack-year history of smoking and currently smoke or have quit within the past 15 years. Fecal occult blood test (FOBT) of the stool. You may have this test every year starting at age 24. Flexible sigmoidoscopy or colonoscopy. You may have a sigmoidoscopy every 5 years or a colonoscopy every 10 years starting at age 31. Prostate cancer screening. Recommendations will vary depending on your family history and other risks. Hepatitis C blood test. Hepatitis B blood test. Sexually transmitted disease (STD) testing. Diabetes screening. This is done by checking your blood sugar (glucose) after you have not eaten for a while (fasting). You may have this done every 1-3 years. Abdominal aortic aneurysm (AAA) screening. You may need this  if you are a current or former smoker. Osteoporosis. You may be screened starting at age 46 if you are at high risk. Talk with your health care provider about your test results, treatment options, and if necessary, the need for more tests. Vaccines  Your health care provider may recommend certain vaccines, such as: Influenza vaccine. This is  recommended every year. Tetanus, diphtheria, and acellular pertussis (Tdap, Td) vaccine. You may need a Td booster every 10 years. Zoster vaccine. You may need this after age 17. Pneumococcal 13-valent conjugate (PCV13) vaccine. One dose is recommended after age 37. Pneumococcal polysaccharide (PPSV23) vaccine. One dose is recommended after age 67. Talk to your health care provider about which screenings and vaccines you need and how often you need them. This information is not intended to replace advice given to you by your health care provider. Make sure you discuss any questions you have with your health care provider. Document Released: 05/29/2015 Document Revised: 01/20/2016 Document Reviewed: 03/03/2015 Elsevier Interactive Patient Education  2017 [employer].  Fall Prevention in the Home Falls can cause injuries. They can happen to people of all ages. There are many things you can do to make your home safe and to help prevent falls. What can I do on the outside of my home? Regularly fix the edges of walkways and driveways and fix any cracks. Remove anything that might make you trip as you walk through a door, such as a raised step or threshold. Trim any bushes or trees on the path to your home. Use bright outdoor lighting. Clear any walking paths of anything that might make someone trip, such as rocks or tools. Regularly check to see if handrails are loose or broken. Make sure that both sides of any steps have handrails. Any raised decks and porches should have guardrails on the edges. Have any leaves, snow, or ice cleared regularly. Use sand or salt on walking paths during winter. Clean up any spills in your garage right away. This includes oil or grease spills. What can I do in the bathroom? Use night lights. Install grab bars by the toilet and in the tub and shower. Do not use towel bars as grab bars. Use non-skid mats or decals in the tub or shower. If you need to sit down in  the shower, use a plastic, non-slip stool. Keep the floor dry. Clean up any water that spills on the floor as soon as it happens. Remove soap buildup in the tub or shower regularly. Attach bath mats securely with double-sided non-slip rug tape. Do not have throw rugs and other things on the floor that can make you trip. What can I do in the bedroom? Use night lights. Make sure that you have a light by your bed that is easy to reach. Do not use any sheets or blankets that are too big for your bed. They should not hang down onto the floor. Have a firm chair that has side arms. You can use this for support while you get dressed. Do not have throw rugs and other things on the floor that can make you trip. What can I do in the kitchen? Clean up any spills right away. Avoid walking on wet floors. Keep items that you use a lot in easy-to-reach places. If you need to reach something above you, use a strong step stool that has a grab bar. Keep electrical cords out of the way. Do not use floor polish or wax that makes floors slippery.  If you must use wax, use non-skid floor wax. Do not have throw rugs and other things on the floor that can make you trip. What can I do with my stairs? Do not leave any items on the stairs. Make sure that there are handrails on both sides of the stairs and use them. Fix handrails that are broken or loose. Make sure that handrails are as long as the stairways. Check any carpeting to make sure that it is firmly attached to the stairs. Fix any carpet that is loose or worn. Avoid having throw rugs at the top or bottom of the stairs. If you do have throw rugs, attach them to the floor with carpet tape. Make sure that you have a light switch at the top of the stairs and the bottom of the stairs. If you do not have them, ask someone to add them for you. What else can I do to help prevent falls? Wear shoes that: Do not have high heels. Have rubber bottoms. Are comfortable  and fit you well. Are closed at the toe. Do not wear sandals. If you use a stepladder: Make sure that it is fully opened. Do not climb a closed stepladder. Make sure that both sides of the stepladder are locked into place. Ask someone to hold it for you, if possible. Clearly mark and make sure that you can see: Any grab bars or handrails. First and last steps. Where the edge of each step is. Use tools that help you move around (mobility aids) if they are needed. These include: Canes. Walkers. Scooters. Crutches. Turn on the lights when you go into a dark area. Replace any light bulbs as soon as they burn out. Set up your furniture so you have a clear path. Avoid moving your furniture around. If any of your floors are uneven, fix them. If there are any pets around you, be aware of where they are. Review your medicines with your doctor. Some medicines can make you feel dizzy. This can increase your chance of falling. Ask your doctor what other things that you can do to help prevent falls. This information is not intended to replace advice given to you by your health care provider. Make sure you discuss any questions you have with your health care provider. Document Released: 02/26/2009 Document Revised: 10/08/2015 Document Reviewed: 06/06/2014 Elsevier Interactive Patient Education  2017 [employer].

## 2022-12-06 NOTE — Assessment & Plan Note (Signed)
Last vitamin D Lab Results  Component Value Date   VD25OH 79.43 12/06/2022   Stable, cont oral replacement

## 2022-12-06 NOTE — Assessment & Plan Note (Signed)
Lab Results  Component Value Date   HGBA1C 5.5 12/06/2022   Stable, pt to continue current medical treatment metformin ER 500 gm qd

## 2022-12-06 NOTE — Progress Notes (Signed)
[address]       [place] [address]             [phone]     HPI: Mr. Grumbine is a 77 year old male with a past medical history of hypertension, mild aortic stenosis, CVA, chronic diastolic CHF, NSTEMI, aortic atherosclerosis, pericardial effusion, community-acquired pneumonia, type 2 diabetes, CKD stage IIIa, anxiety, insomnia, and hyperlipidemia. He returns for routine postoperative follow-up having undergone CABG x 5 by Dr. Leafy Ro on 06/27. The patient's early postoperative recovery while in the hospital was notable for early postoperative thrombocytopenia and AKI on CKD that resolved with time as well as atrial fibrillation with RVR which converted to NSR with Amiodarone. He was severely deconditioned and was discharged in stable condition to a SNF on 07/08. On 07/10 he was brought to the ED from SNF due to a 2 day history of hemoptysis. CTA was negative for PE and echocardiogram showed a mild pericardial effusion and small bilateral pleural effusions, he was seen by Dr. Dorris Fetch and it was felt this was expected and did not require further intervention. The hospitalist service continued to monitor him and he was discharged in stable condition back to SNF on 07/14. He was seen by cardiology on 07/18 where he was felt to be doing well, his chest tube sutures were removed and no medication changes were made.   Since hospital discharge the patient reports chest soreness that is worse at night, but he no longer requires pain medication. He denies shortness of breath, dizziness and LOC. He returned home from SNF on Friday 07/19 and home health PT/OT is to start this week or next week.   Current Outpatient Medications  Medication Sig Dispense Refill   amiodarone (PACERONE) 200 MG tablet Take 1 tablet (200 mg total) by mouth 2 (two) times daily. X 7 days, then decrease to 200 mg daily     aspirin EC 325 MG tablet Take 1 tablet (325 mg total) by mouth daily.      atorvastatin (LIPITOR) 80 MG tablet Take 1 tablet (80 mg total) by mouth daily. 30 tablet 2   cholecalciferol (VITAMIN D3) 25 MCG (1000 UNIT) tablet Take 1 tablet (1,000 Units total) by mouth daily. Take 1,000 Units by mouth daily. 90 tablet 2   Evolocumab (REPATHA SURECLICK) 140 MG/ML SOAJ Inject 140 mg into the skin every 14 days. 6 mL 3   fenofibrate 160 MG tablet Take 1 tablet (160 mg total) by mouth daily. 90 tablet 1   fluticasone (FLONASE) 50 MCG/ACT nasal spray Place 2 sprays into both nostrils daily. 48 g 1   gabapentin (NEURONTIN) 300 MG capsule Take 1 capsule (300 mg total) by mouth at bedtime. 90 capsule 3   hydrALAZINE (APRESOLINE) 25 MG tablet Take 1 tablet (25 mg total) by mouth every 8 (eight) hours. 90 tablet 2   losartan (COZAAR) 100 MG tablet Take 1 tablet (100 mg total) by mouth daily. 180 tablet 1   magnesium oxide (MAG-OX) 400 (240 Mg) MG tablet Take 400 mg by mouth daily. (Patient not taking: Reported on 12/01/2022)     Melatonin 10 MG TABS Take 10 mg by mouth at bedtime.     metFORMIN (GLUCOPHAGE-XR) 500 MG 24 hr tablet Take 1 tablet (500 mg) by mouth daily with breakfast. 180 tablet 1   metoprolol tartrate (LOPRESSOR) 25 MG tablet Take 1 tablet (25 mg total) by mouth 2 (two) times daily. 60 tablet 1  nitroGLYCERIN (NITROSTAT) 0.4 MG SL tablet Place 1 tablet under the tongue every 5 minutes as needed for chest pain. 25 tablet 3   PARoxetine (PAXIL) 40 MG tablet Take 1 tablet (40 mg total) by mouth every morning. 90 tablet 1   vitamin E 180 MG (400 UNITS) capsule Take 400 Units by mouth daily.     No current facility-administered medications for this visit.   Vitals: Today's Vitals   12/06/22 1440  BP: 123/64  Pulse: 75  Resp: 18  SpO2: 96%  Weight: 166 lb (75.3 kg)  Height: 6' (1.829 m)   Body mass index is 22.51 kg/m.  Physical Exam: General: Alert and oriented, no acute distress Neuro: Grossly intact CV: Regular rate and rhythm, no murmur Pulm: Clear to  auscultation bilaterally GI: Active bowel sounds, no tenderness or distension Extremities: No edema Wound: Sternal incision healing well, chest tube site with 1 last chest tube stitch removed. BLE with some eschar covering EVH sites, no sign of infection.  Ecchymosis and nontender small lump of LLE around the [facility] site.   Diagnostic Tests: CLINICAL DATA:  Status post coronary artery bypass graft.   EXAM: CHEST - 2 VIEW   COMPARISON:  November 23, 2022.   FINDINGS: The heart size and mediastinal contours are within normal limits. Sternotomy wires are noted. Right lung is clear. Minimal left basilar subsegmental atelectasis is noted with small left pleural effusion. The visualized skeletal structures are unremarkable.   IMPRESSION: Minimal left basilar subsegmental atelectasis is noted with small left pleural effusion.     Electronically Signed   By: Lupita Raider M.D.   On: 12/06/2022 14:23  Impression/Plan: S/P CABG x 5: Mr. Clugston is almost 4 weeks s/p CABG x 5, he is overall progressing well but continues to be deconditioned. He just returned to his home from SNF on Friday 07/19 and plans to start home health PT soon. Today is the first day he has used a cane instead of a walker. He does state he has some chest soreness especially at night, Tylenol, heat and ice were recommended as he does not want to use pain medication. He has had no further hemoptysis since his last hospital stay. He denies dizziness and LOC and has been increasing his activity level since he returned home. His CXR shows minimal left basilar atelectasis and a small left pleural effusion, but the patient denies SOB. He was encouraged to continue incentive spirometry and ambulation. He does have some eschar over several of his EVH sites, ecchymosis and a small lump around the left EVH site, he states they are improving and there is no sign of infection. He was instructed to call our office if this worsens. He is not on  Plavix or Eliquis due to his thrombocytopenia and anemia while in the hospital. Will not make any medication changes today. We reviewed continued sternal precautions. Plan to follow up with Dr. Leafy Ro in 3 weeks for cardiac rehab clearance.   Jenny Reichmann, PA-C [facility] [phone]

## 2022-12-06 NOTE — Telephone Encounter (Signed)
Prescription Request  12/06/2022  LOV: 12/06/2022  What is the name of the medication or equipment? amiodarone (PACERONE) 200 MG tablet   Have you contacted your pharmacy to request a refill? No   Which pharmacy would you like this sent to?   [facility] on [id number] in [place], [place] Phone: [phone]  Patient notified that their request is being sent to the clinical staff for review and that they should receive a response within 2 business days.   Please advise at Mobile [phone] (mobile)

## 2022-12-06 NOTE — Assessment & Plan Note (Signed)
Lab Results  Component Value Date   CREATININE 1.53 (H) 12/06/2022   Stable overall, cont to avoid nephrotoxins

## 2022-12-06 NOTE — Progress Notes (Signed)
The test results show that your current treatment is OK, as the tests are stable.  Please continue the same plan.  There is no other need for change of treatment or further evaluation based on these results, at this time.  thanks 

## 2022-12-06 NOTE — Assessment & Plan Note (Signed)
Lab Results  Component Value Date   LDLCALC 17 10/31/2022   Stable, pt to continue current statin repatha 14, fenofibrate 160

## 2022-12-06 NOTE — Patient Instructions (Signed)
Please continue all other medications as before, and refills have been done if requested.  Please have the pharmacy call with any other refills you may need.  Please continue your efforts at being more active, low cholesterol diet, and weight control.  Please keep your appointments with your specialists as you may have planned     

## 2022-12-06 NOTE — Progress Notes (Signed)
Patient ID: Collin Bridges, male   DOB: 05-06-1946, 77 y.o.   MRN: [id number]        Chief Complaint: follow up post hospn July 10-14 with recurring hemoptysis x 2 days in the setting of with medical history significant of CAD status post recent CABG, CHF, type 2 diabetes, GERD, hypertension, prior stroke        HPI:  Collin Bridges is a 77 y.o. male here with above, Patient admitted with hemoptysis of 2 days duration. Denies any shortness of breath. Chest x-ray raised suspicion for possible left lower lobe infiltrate. CTA negative for PE. No leukocytosis and afebrile. Procalcitonin unremarkable. No more hemoptysis since admission. Doubt pneumonia. Does not need antibiotic  Felt to be stable per CTS with respect to recent surgury and mild pericardial effusion. Plavix changed to asa 325 every day.  Has seen cardiology last wk, and to see CTS later today.  No new complaints       Wt Readings from Last 3 Encounters:  12/06/22 166 lb (75.3 kg)  12/06/22 164 lb (74.4 kg)  12/06/22 164 lb (74.4 kg)   BP Readings from Last 3 Encounters:  12/06/22 123/64  12/06/22 122/70  12/06/22 122/70         Past Medical History:  Diagnosis Date   Allergy    seasonal   Anginal pain (HCC)    Anxiety    CHF (congestive heart failure) (HCC)    Coronary artery disease    Depression    Diabetes mellitus without complication (HCC)    Type 2   GERD (gastroesophageal reflux disease)    Pt states "take ambien for this"   Hepatitis C    HLD (hyperlipidemia) 08/09/2017   Hypertension    Hypoglycemia    Pneumonia 06/2022   Stroke (HCC) 03/2021   TIA   Past Surgical History:  Procedure Laterality Date   APPENDECTOMY     BUBBLE STUDY  06/16/2021   Procedure: BUBBLE STUDY;  Surgeon: Sande Rives, MD;  Location: [facility] ENDOSCOPY;  Service: Cardiovascular;;   COLONOSCOPY     CORONARY ARTERY BYPASS GRAFT N/A 11/10/2022   Procedure: CORONARY ARTERY BYPASS GRAFTING (CABG) X5 USING LEFT  INTERNAL MAMMARY ARTERY (LIMA) AND ENDOSCOPICALLY HARVESTED RIGHT AND LEFT GREATER SAPHENOUS VEIN;  Surgeon: Eugenio Hoes, MD;  Location: [facility] OR;  Service: Open Heart Surgery;  Laterality: N/A;   FRACTURE SURGERY Left    Ankle   LEFT HEART CATH AND CORONARY ANGIOGRAPHY N/A 07/12/2022   Procedure: LEFT HEART CATH AND CORONARY ANGIOGRAPHY;  Surgeon: Marykay Lex, MD;  Location: [facility] INVASIVE CV LAB;  Service: Cardiovascular;  Laterality: N/A;   TEE WITHOUT CARDIOVERSION N/A 06/16/2021   Procedure: TRANSESOPHAGEAL ECHOCARDIOGRAM (TEE);  Surgeon: Sande Rives, MD;  Location: [facility] ENDOSCOPY;  Service: Cardiovascular;  Laterality: N/A;   TEE WITHOUT CARDIOVERSION N/A 11/10/2022   Procedure: TRANSESOPHAGEAL ECHOCARDIOGRAM;  Surgeon: Eugenio Hoes, MD;  Location: [facility] OR;  Service: Open Heart Surgery;  Laterality: N/A;   TONSILECTOMY, ADENOIDECTOMY, BILATERAL MYRINGOTOMY AND TUBES  1955    reports that he has quit smoking. He has never used smokeless tobacco. He reports that he does not currently use alcohol. He reports that he does not use drugs. family history includes Hyperlipidemia in his father; Hypertension in his father; Lung disease in his mother. No Known Allergies Current Outpatient Medications on File Prior to Visit  Medication Sig Dispense Refill   amiodarone (PACERONE) 200 MG tablet Take 1 tablet (200 mg total) by  mouth 2 (two) times daily. X 7 days, then decrease to 200 mg daily     aspirin EC 325 MG tablet Take 1 tablet (325 mg total) by mouth daily.     cholecalciferol (VITAMIN D3) 25 MCG (1000 UNIT) tablet Take 1 tablet (1,000 Units total) by mouth daily. Take 1,000 Units by mouth daily. 90 tablet 2   Evolocumab (REPATHA SURECLICK) 140 MG/ML SOAJ Inject 140 mg into the skin every 14 days. 6 mL 3   fenofibrate 160 MG tablet Take 1 tablet (160 mg total) by mouth daily. 90 tablet 1   fluticasone (FLONASE) 50 MCG/ACT nasal spray Place 2 sprays into both nostrils daily. 48 g 1    gabapentin (NEURONTIN) 300 MG capsule Take 1 capsule (300 mg total) by mouth at bedtime. 90 capsule 3   hydrALAZINE (APRESOLINE) 25 MG tablet Take 1 tablet (25 mg total) by mouth every 8 (eight) hours. 90 tablet 2   losartan (COZAAR) 100 MG tablet Take 1 tablet (100 mg total) by mouth daily. 180 tablet 1   magnesium oxide (MAG-OX) 400 (240 Mg) MG tablet Take 400 mg by mouth daily.     Melatonin 10 MG TABS Take 10 mg by mouth at bedtime.     metFORMIN (GLUCOPHAGE-XR) 500 MG 24 hr tablet Take 1 tablet (500 mg) by mouth daily with breakfast. 180 tablet 1   metoprolol tartrate (LOPRESSOR) 25 MG tablet Take 1 tablet (25 mg total) by mouth 2 (two) times daily. 60 tablet 1   nitroGLYCERIN (NITROSTAT) 0.4 MG SL tablet Place 1 tablet under the tongue every 5 minutes as needed for chest pain. 25 tablet 3   PARoxetine (PAXIL) 40 MG tablet Take 1 tablet (40 mg total) by mouth every morning. 90 tablet 1   vitamin E 180 MG (400 UNITS) capsule Take 400 Units by mouth daily.     atorvastatin (LIPITOR) 80 MG tablet Take 1 tablet (80 mg total) by mouth daily. 30 tablet 2   No current facility-administered medications on file prior to visit.        ROS:  All others reviewed and negative.  Objective        PE:  BP 122/70 (BP Location: Right Arm, Patient Position: Sitting, Cuff Size: Normal)   Pulse 67   Temp 98.1 F (36.7 C) (Oral)   Ht 6' (1.829 m)   Wt 164 lb (74.4 kg)   SpO2 97%   BMI 22.24 kg/m                 Constitutional: Pt appears in NAD               HENT: Head: NCAT.                Right Ear: External ear normal.                 Left Ear: External ear normal.                Eyes: . Pupils are equal, round, and reactive to light. Conjunctivae and EOM are normal               Nose: without d/c or deformity               Neck: Neck supple. Gross normal ROM               Cardiovascular: Normal rate and regular rhythm.  Pulmonary/Chest: Effort normal and breath sounds without  rales or wheezing.                Abd:  Soft, NT, ND, + BS, no organomegaly               Neurological: Pt is alert. At baseline orientation, motor grossly intact               Skin: Skin is warm. No rashes, no other new lesions, LE edema - none               Psychiatric: Pt behavior is normal without agitation   Micro: none  Cardiac tracings I have personally interpreted today:  none  Pertinent Radiological findings (summarize): none   Lab Results  Component Value Date   WBC 7.3 12/06/2022   HGB 12.7 (L) 12/06/2022   HCT 38.6 (L) 12/06/2022   PLT 229.0 12/06/2022   GLUCOSE 100 (H) 12/06/2022   CHOL 88 (L) 10/31/2022   TRIG 122 10/31/2022   HDL 49 10/31/2022   LDLDIRECT 146.0 06/03/2022   LDLCALC 17 10/31/2022   ALT 16 12/06/2022   AST 22 12/06/2022   NA 135 12/06/2022   K 4.7 12/06/2022   CL 101 12/06/2022   CREATININE 1.53 (H) 12/06/2022   BUN 15 12/06/2022   CO2 25 12/06/2022   TSH 0.98 06/03/2022   PSA 3.06 06/03/2022   INR 1.3 (H) 11/22/2022   HGBA1C 5.5 12/06/2022   MICROALBUR <0.7 06/03/2022   Assessment/Plan:  Dex Blakely is a 77 y.o. White or Caucasian [1] male with  has a past medical history of Allergy, Anginal pain (HCC), Anxiety, CHF (congestive heart failure) (HCC), Coronary artery disease, Depression, Diabetes mellitus without complication (HCC), GERD (gastroesophageal reflux disease), Hepatitis C, HLD (hyperlipidemia) (08/09/2017), Hypertension, Hypoglycemia, Pneumonia (06/2022), and Stroke (HCC) (03/2021).  DM2 (diabetes mellitus, type 2) (HCC) Lab Results  Component Value Date   HGBA1C 5.5 12/06/2022   Stable, pt to continue current medical treatment metformin ER 500 gm qd    Essential hypertension BP Readings from Last 3 Encounters:  12/06/22 123/64  12/06/22 122/70  12/06/22 122/70   Stable, pt to continue medical treatment hydralazine 25 tid, losartan 100 every day, lopressor 25 bid   Hyperlipidemia LDL goal <70 Lab Results   Component Value Date   LDLCALC 17 10/31/2022   Stable, pt to continue current statin repatha 14, fenofibrate 160    Vitamin D deficiency Last vitamin D Lab Results  Component Value Date   VD25OH 79.43 12/06/2022   Stable, cont oral replacement   Stage 3a chronic kidney disease (CKD) (HCC) Lab Results  Component Value Date   CREATININE 1.53 (H) 12/06/2022   Stable overall, cont to avoid nephrotoxins  Chronic diastolic CHF (congestive heart failure) (HCC) Stable volume, cont current med tx  Followup: Return in about 6 months (around 06/08/2023).  Oliver Barre, MD 12/06/2022 8:55 PM [facility]

## 2022-12-07 ENCOUNTER — Telehealth: Payer: Self-pay | Admitting: Internal Medicine

## 2022-12-07 ENCOUNTER — Ambulatory Visit: Payer: 59 | Admitting: Internal Medicine

## 2022-12-07 NOTE — Telephone Encounter (Signed)
Ok to let pt know that this is a particular medication that normally is refilled per cardiology  Please let me know if they do not have a cardiologist and we can take of the refill and referral, but o/w please call cardiology for refill request

## 2022-12-07 NOTE — Telephone Encounter (Signed)
Called pt he states he does have a cardiologist Dr. Coral Else he states he will call them for refill. He states he was put on that meds when he was in cardiac rehab.Marland KitchenRaechel Chute

## 2022-12-07 NOTE — Telephone Encounter (Signed)
HH ORDERS   Caller Name: [facility] Agency Name: Sharlyne Pacas Phone #: [phone](secure)  Service Requested: Home health nursing (examples: OT/PT/Skilled Nursing/Social Work/Speech Therapy/Wound Care)  Frequency of Visits: 1 week 3

## 2022-12-07 NOTE — Telephone Encounter (Signed)
Ok for verbals 

## 2022-12-08 ENCOUNTER — Other Ambulatory Visit: Payer: Self-pay | Admitting: Cardiovascular Disease

## 2022-12-08 ENCOUNTER — Telehealth: Payer: Self-pay | Admitting: Cardiovascular Disease

## 2022-12-08 MED ORDER — METOPROLOL TARTRATE 25 MG PO TABS: 25.0000 mg | ORAL_TABLET | Freq: Two times a day (BID) | ORAL | 3 refills | Status: DC

## 2022-12-08 MED ORDER — AMIODARONE HCL 200 MG PO TABS: 200.0000 mg | ORAL_TABLET | Freq: Every day | ORAL | 1 refills | Status: DC

## 2022-12-08 MED ORDER — METOPROLOL TARTRATE 25 MG PO TABS
25.0000 mg | ORAL_TABLET | Freq: Two times a day (BID) | ORAL | 3 refills | Status: DC
  Filled 2023-06-23: qty 180, 90d supply, fill #0

## 2022-12-08 NOTE — Telephone Encounter (Signed)
Per  last office note with [name] NP

## 2022-12-08 NOTE — Telephone Encounter (Signed)
*  STAT* If patient is at the pharmacy, call can be transferred to refill team.   1. Which medications need to be refilled? (please list name of each medication and dose if known) metoprolol tartrate (LOPRESSOR) 25 MG tablet   2. Which pharmacy/location (including street and city if local pharmacy) is medication to be sent to? [id number] - [place], [place] - [address]   3. Do they need a 30 day or 90 day supply? 90

## 2022-12-08 NOTE — Telephone Encounter (Signed)
Pt is requesting a refill on amiodarone. This medication was start in the hospital, but never sent to pt's pharmacy, for pt to start this medication. Would Dr. Flora Lipps like to refill this medication with starting dose? Please address

## 2022-12-08 NOTE — Telephone Encounter (Signed)
Called Collin Bridges no answer LMOM w/MD response.Marland KitchenRaechel Chute

## 2022-12-08 NOTE — Telephone Encounter (Signed)
Pt c/o medication issue:  1. Name of Medication:   amiodarone (PACERONE) 200 MG tablet    2. How are you currently taking this medication (dosage and times per day)?  Take 1 tablet (200 mg total) by mouth 2 (two) times daily. X 7 days, then decrease to 200 mg daily       3. Are you having a reaction (difficulty breathing--STAT)? No  4. What is your medication issue? Requesting a callback to see if pt should continue taking above medication or not before they request a refill. Please advise

## 2022-12-08 NOTE — Telephone Encounter (Signed)
Pt's medication was sent to pt's pharmacy as requested. Confirmation received.  °

## 2022-12-08 NOTE — Telephone Encounter (Signed)
Call to patient GF and noted that he is to continue the medication.  Refill sent to pharmacy as requested

## 2022-12-08 NOTE — Telephone Encounter (Signed)
*  STAT* If patient is at the pharmacy, call can be transferred to refill team.   1. Which medications need to be refilled? (please list name of each medication and dose if known)           amiodarone (PACERONE) 200 MG tablet     2. Which pharmacy/location (including street and city if local pharmacy) is medication to be sent to?  [id number] - [place], [place] - [address]      3. Do they need a 30 day or 90 day supply? 90 day   Pt is completely out of medication

## 2022-12-16 ENCOUNTER — Other Ambulatory Visit: Payer: Self-pay | Admitting: Thoracic Surgery (Cardiothoracic Vascular Surgery)

## 2022-12-16 DIAGNOSIS — Z951 Presence of aortocoronary bypass graft: Secondary | ICD-10-CM

## 2022-12-18 NOTE — Progress Notes (Unsigned)
[address]       [place] [address]             [phone]           [name] [place] Medical Record #[id number] Date of Birth: 01-26-1946  Antoine Poche, MD Corwin Levins, MD  Chief Complaint:   follow up CABG   History of Present Illness:     Pt is a frail 77 yo male who underwent CABGx5 in late June. Pt had afib and CRI and thrombocytopenia and recovered to a SNF. Pt then was readmitted with hemoptysis and small pleural effusion. Plavix was stopped and he was sent back to SNF. Pt now home and reports doing well. He has lost weight and says he eats till he is full and has supplements at home but has not taken them. He is ambulating without assistance and feeling getting stronger.      Past Medical History:  Diagnosis Date   Allergy    seasonal   Anginal pain (HCC)    Anxiety    CHF (congestive heart failure) (HCC)    Coronary artery disease    Depression    Diabetes mellitus without complication (HCC)    Type 2   GERD (gastroesophageal reflux disease)    Pt states "take ambien for this"   Hepatitis C    HLD (hyperlipidemia) 08/09/2017   Hypertension    Hypoglycemia    Pneumonia 06/2022   Stroke (HCC) 03/2021   TIA    Past Surgical History:  Procedure Laterality Date   APPENDECTOMY     BUBBLE STUDY  06/16/2021   Procedure: BUBBLE STUDY;  Surgeon: Sande Rives, MD;  Location: [facility] ENDOSCOPY;  Service: Cardiovascular;;   COLONOSCOPY     CORONARY ARTERY BYPASS GRAFT N/A 11/10/2022   Procedure: CORONARY ARTERY BYPASS GRAFTING (CABG) X5 USING LEFT INTERNAL MAMMARY ARTERY (LIMA) AND ENDOSCOPICALLY HARVESTED RIGHT AND LEFT GREATER SAPHENOUS VEIN;  Surgeon: Eugenio Hoes, MD;  Location: [facility] OR;  Service: Open Heart Surgery;  Laterality: N/A;   FRACTURE SURGERY Left    Ankle   LEFT HEART CATH AND CORONARY ANGIOGRAPHY N/A 07/12/2022   Procedure: LEFT HEART CATH AND CORONARY ANGIOGRAPHY;  Surgeon: Marykay Lex, MD;   Location: [facility] INVASIVE CV LAB;  Service: Cardiovascular;  Laterality: N/A;   TEE WITHOUT CARDIOVERSION N/A 06/16/2021   Procedure: TRANSESOPHAGEAL ECHOCARDIOGRAM (TEE);  Surgeon: Sande Rives, MD;  Location: [facility] ENDOSCOPY;  Service: Cardiovascular;  Laterality: N/A;   TEE WITHOUT CARDIOVERSION N/A 11/10/2022   Procedure: TRANSESOPHAGEAL ECHOCARDIOGRAM;  Surgeon: Eugenio Hoes, MD;  Location: [facility] OR;  Service: Open Heart Surgery;  Laterality: N/A;   TONSILECTOMY, ADENOIDECTOMY, BILATERAL MYRINGOTOMY AND TUBES  1955    Social History   Tobacco Use  Smoking Status Former  Smokeless Tobacco Never  Tobacco Comments   quit 60yrs ago, smoke a cigar occassionally.    Social History   Substance and Sexual Activity  Alcohol Use Not Currently   Comment: Quit 30 years ago    Social History   Socioeconomic History   Marital status: Single    Spouse name: Not on file   Number of children: 0   Years of education: some college   Highest education level: Associate degree: occupational, Scientist, product/process development, or vocational program  Occupational History   Occupation: retired  Tobacco Use   Smoking status: Former   Smokeless tobacco: Never   Tobacco comments:    quit 59yrs  ago, smoke a cigar occassionally.  Vaping Use   Vaping status: Never Used  Substance and Sexual Activity   Alcohol use: Not Currently    Comment: Quit 30 years ago   Drug use: No   Sexual activity: Yes  Other Topics Concern   Not on file  Social History Narrative   Lives alone.   Right-handed.   Two cups caffeine per day.   Social Determinants of Health   Financial Resource Strain: Low Risk  (12/06/2022)   Overall Financial Resource Strain (CARDIA)    Difficulty of Paying Living Expenses: Not very hard  Food Insecurity: Food Insecurity Present (12/06/2022)   Hunger Vital Sign    Worried About Running Out of Food in the Last Year: Sometimes true    Ran Out of Food in the Last Year: Never true  Transportation Needs:  No Transportation Needs (12/06/2022)   PRAPARE - Administrator, Civil Service (Medical): No    Lack of Transportation (Non-Medical): No  Physical Activity: Sufficiently Active (12/06/2022)   Exercise Vital Sign    Days of Exercise per Week: 7 days    Minutes of Exercise per Session: 40 min  Recent Concern: Physical Activity - Insufficiently Active (09/29/2022)   Exercise Vital Sign    Days of Exercise per Week: 1 day    Minutes of Exercise per Session: 10 min  Stress: Stress Concern Present (12/06/2022)   [employer] of Occupational Health - Occupational Stress Questionnaire    Feeling of Stress : To some extent  Social Connections: Socially Isolated (12/06/2022)   Social Connection and Isolation Panel [NHANES]    Frequency of Communication with Friends and Family: Twice a week    Frequency of Social Gatherings with Friends and Family: Once a week    Attends Religious Services: Never    Database administrator or Organizations: No    Attends Banker Meetings: Never    Marital Status: Divorced  Catering manager Violence: Not At Risk (12/06/2022)   Humiliation, Afraid, Rape, and Kick questionnaire    Fear of Current or Ex-Partner: No    Emotionally Abused: No    Physically Abused: No    Sexually Abused: No    No Known Allergies  Current Outpatient Medications  Medication Sig Dispense Refill   amiodarone (PACERONE) 200 MG tablet Take 1 tablet (200 mg total) by mouth daily. Take 200 mg daily 90 tablet 1   aspirin EC 325 MG tablet Take 1 tablet (325 mg total) by mouth daily.     atorvastatin (LIPITOR) 80 MG tablet Take 1 tablet (80 mg total) by mouth daily. 30 tablet 2   cholecalciferol (VITAMIN D3) 25 MCG (1000 UNIT) tablet Take 1 tablet (1,000 Units total) by mouth daily. Take 1,000 Units by mouth daily. 90 tablet 2   Evolocumab (REPATHA SURECLICK) 140 MG/ML SOAJ Inject 140 mg into the skin every 14 days. 6 mL 3   fenofibrate 160 MG tablet Take 1 tablet  (160 mg total) by mouth daily. 90 tablet 1   fluticasone (FLONASE) 50 MCG/ACT nasal spray Place 2 sprays into both nostrils daily. 48 g 1   gabapentin (NEURONTIN) 300 MG capsule Take 1 capsule (300 mg total) by mouth at bedtime. 90 capsule 3   hydrALAZINE (APRESOLINE) 25 MG tablet Take 1 tablet (25 mg total) by mouth every 8 (eight) hours. 90 tablet 2   losartan (COZAAR) 100 MG tablet Take 1 tablet (100 mg total) by mouth daily. 180 tablet  1   magnesium oxide (MAG-OX) 400 (240 Mg) MG tablet Take 400 mg by mouth daily.     Melatonin 10 MG TABS Take 10 mg by mouth at bedtime.     metFORMIN (GLUCOPHAGE-XR) 500 MG 24 hr tablet Take 1 tablet (500 mg) by mouth daily with breakfast. 180 tablet 1   metoprolol tartrate (LOPRESSOR) 25 MG tablet Take 1 tablet (25 mg total) by mouth 2 (two) times daily. 180 tablet 3   nitroGLYCERIN (NITROSTAT) 0.4 MG SL tablet Place 1 tablet under the tongue every 5 minutes as needed for chest pain. 25 tablet 3   PARoxetine (PAXIL) 40 MG tablet Take 1 tablet (40 mg total) by mouth every morning. 90 tablet 1   vitamin E 180 MG (400 UNITS) capsule Take 400 Units by mouth daily.     No current facility-administered medications for this visit.     Family History  Problem Relation Age of Onset   Lung disease Mother    Hyperlipidemia Father    Hypertension Father        Physical Exam: Frail appearing Lungs; clear Card: RR soft murmur Ext: warm no edema Wounds with some eshcars but no signs of infection Neuro: no focal deficits     Diagnostic Studies & Laboratory data: I have personally reviewed the following studies and agree with the findings     Recent Radiology Findings:       Recent Lab Findings: Lab Results  Component Value Date   WBC 7.3 12/06/2022   HGB 12.7 (L) 12/06/2022   HCT 38.6 (L) 12/06/2022   PLT 229.0 12/06/2022   GLUCOSE 100 (H) 12/06/2022   CHOL 88 (L) 10/31/2022   TRIG 122 10/31/2022   HDL 49 10/31/2022   LDLDIRECT 146.0  06/03/2022   LDLCALC 17 10/31/2022   ALT 16 12/06/2022   AST 22 12/06/2022   NA 135 12/06/2022   K 4.7 12/06/2022   CL 101 12/06/2022   CREATININE 1.53 (H) 12/06/2022   BUN 15 12/06/2022   CO2 25 12/06/2022   TSH 0.98 06/03/2022   INR 1.3 (H) 11/22/2022   HGBA1C 5.5 12/06/2022      Assessment / Plan:     Pt over 2 months out from CABG. Slowly improving. With his small calcified vessels would be nice to be on plavix and will ask cardiology to restart on next visit if no further hemoptysis. Will stop amiodarone for now. Asked pt to start his nutritional supplements. Will have outpt cardiac rehab reach out to him to start program. Cxr completely clear today. Will follow up prn. Pt is released to have all other medical/dental issues addressed   I have spent 20 min in review of the records, viewing studies and in face to face with patient and in coordination of future care    Eugenio Hoes 12/18/2022 4:57 PM

## 2022-12-19 ENCOUNTER — Ambulatory Visit: Payer: 59 | Admitting: Thoracic Surgery (Cardiothoracic Vascular Surgery)

## 2022-12-19 ENCOUNTER — Ambulatory Visit
Admission: RE | Admit: 2022-12-19 | Discharge: 2022-12-19 | Disposition: A | Discharge status: Home / Self Care | Payer: 59 | Source: Ambulatory Visit | Attending: Thoracic Surgery (Cardiothoracic Vascular Surgery) | Admitting: Thoracic Surgery (Cardiothoracic Vascular Surgery)

## 2022-12-19 VITALS — BP 113/58 | HR 80 | Resp 20 | Ht 72.0 in | Wt 166.0 lb

## 2022-12-19 DIAGNOSIS — Z951 Presence of aortocoronary bypass graft: Secondary | ICD-10-CM

## 2022-12-19 DIAGNOSIS — I2510 Atherosclerotic heart disease of native coronary artery without angina pectoris: Secondary | ICD-10-CM

## 2022-12-19 NOTE — Patient Instructions (Signed)
Out pt cardiac rehab

## 2022-12-20 ENCOUNTER — Telehealth: Payer: Self-pay

## 2022-12-20 ENCOUNTER — Telehealth: Payer: Self-pay | Admitting: Internal Medicine

## 2022-12-20 NOTE — Telephone Encounter (Signed)
Collin Bridges from [facility] called letting Dr. Jonny Ruiz know that the pt was discharge from nursing with meeting all his goals.  Best call back number is [phone]

## 2022-12-20 NOTE — Telephone Encounter (Signed)
Referral placed to [facility] for outpatient Cardiac Rehab at [facility] per patient's request per Dr. Karolee Ohs request.

## 2022-12-26 ENCOUNTER — Telehealth (HOSPITAL_COMMUNITY): Payer: Self-pay

## 2022-12-26 NOTE — Telephone Encounter (Signed)
Called pt to schedule orientation and class. Pt hung up on me. Will close referral in 2 weeks.

## 2022-12-29 ENCOUNTER — Encounter (INDEPENDENT_AMBULATORY_CARE_PROVIDER_SITE_OTHER): Payer: Self-pay

## 2023-01-05 ENCOUNTER — Ambulatory Visit (INDEPENDENT_AMBULATORY_CARE_PROVIDER_SITE_OTHER): Payer: 59 | Admitting: Gastroenterology

## 2023-01-05 ENCOUNTER — Encounter: Payer: Self-pay | Admitting: Gastroenterology

## 2023-01-05 VITALS — BP 136/80 | HR 64 | Ht 72.0 in | Wt 164.0 lb

## 2023-01-05 DIAGNOSIS — Z951 Presence of aortocoronary bypass graft: Secondary | ICD-10-CM

## 2023-01-05 DIAGNOSIS — K5900 Constipation, unspecified: Secondary | ICD-10-CM | POA: Diagnosis not present

## 2023-01-05 DIAGNOSIS — Z86010 Personal history of colonic polyps: Secondary | ICD-10-CM | POA: Diagnosis not present

## 2023-01-05 NOTE — Patient Instructions (Addendum)
Continue Miralax as needed   Consider Metamucil if you have loose stools with Miralax   You have been scheduled for a follow up visit 05/18/2023  If your blood pressure at your visit was 140/90 or greater, please contact your primary care physician to follow up on this.  _______________________________________________________  If you are age 78 or older, your body mass index should be between 23-30. Your Body mass index is 22.24 kg/m. If this is out of the aforementioned range listed, please consider follow up with your Primary Care Provider.  If you are age 82 or younger, your body mass index should be between 19-25. Your Body mass index is 22.24 kg/m. If this is out of the aformentioned range listed, please consider follow up with your Primary Care Provider.   ________________________________________________________  The [place] [facility] providers would like to encourage you to use [facility] to communicate with providers for non-urgent requests or questions.  Due to long hold times on the telephone, sending your provider a message by [facility] may be a faster and more efficient way to get a response.  Please allow 48 business hours for a response.  Please remember that this is for non-urgent requests.   It was a pleasure to see you today!  Thank you for trusting me with your gastrointestinal care!    Scott E.Tomasa Rand, MD

## 2023-01-05 NOTE — Progress Notes (Signed)
HPI : Collin Bridges is a 77 y.o. male with a history of coronary artery disease status post 5 vessel CABG June 2024, depression, diabetes, history of occult stroke/TIA who presents for consideration of repeat colonoscopy.  The patient had undergone an initial screening colonoscopy in October 2023 which revealed a large flat polyp in the ascending colon removed piecemeal.  The large polyp was a tubular adenoma without high-grade dysplasia.  He had 5 other smaller tubular adenomas that were removed.  He was recommended to repeat colonoscopy in 6-9 months to reassess the large polypectomy site.  He was hospitalized in February 2024 with severe sepsis secondary to community-acquired pneumonia, NSTEMI. Echocardiogram showed EF 60 to 65%, hypokinesis of the basal inferior segment.  Coronary CTA with multivessel CAD prompting cardiac catheterization which revealed multivessel CAD involving the LAD/D1, LCx, OM1, with extensive disease in small codominant RCA => not favorable from a percutaneous approach.  CT surgery consultation was deferred to the outpatient setting.  MRI/A of the head during admission showed scattered bilateral subcortical strokes thought to be in the setting of small vessel disease, stable 1 x 2 mm anterior communicating artery aneurysm (he has a history of prior CVA, follows with neurology).    He   He underwent the bypass in June 2024 and had slow but stable recovery.  He spent a few weeks in a rehab facility, but is now back home, where he lives by himself.  He was admitted in July for observation with symptoms of hemoptysis.  No clear etiology found it was felt unlikely to be pneumonia.  He did not have further hemoptysis during his admission or since then, but his Plavix has been held since then.  He recently saw his cardiothoracic surgeon earlier this month and was released to have all other medical/dental issues addressed.  Amiodarone was stopped.  It was advised to restart  Plavix given his small vessel calcifications (was stopped after an admission for hemoptysis).  Currently on 325 mg ASA, until follow up with Dr. Lloyd Huger (Cardiology)  Today, the patient feels like he is continuing to improve following his surgery.  He still gets tired easily.  He is able to walk the entire grocery store at a slow pace.  He does not require a cane or any assist device for ambulation.  He is not having any significant shortness of breath.  No chest pain/pressure. He has been having issues with decreased appetite and weight loss.  He has lost about 20 pounds since before his surgery.  He denies abdominal pain, just states that he cannot eat as much as he used to.  He has been having issues with constipation for the past few weeks.  He was taking Dulcolax, which caused significant diarrhea, so he recently bought some MiraLAX, but has not tried it yet.    Colonoscopy Oct 2023 (Dr. Tomasa Rand) Impression - One 25 mm polyp in the ascending colon, removed piecemeal with cold snare polypectomy. Resected and retrieved. Clips (MR conditional) were placed. Tattooed.  - Four 4 to 8 mm polyps in the ascending colon, removed with a cold snare. Resected and retrieved.  - One 5 mm polyp in the sigmoid colon, removed with a cold snare. Resected and retrieved.  - Diverticulosis in the sigmoid colon, in the descending colon and in the ascending colon.  1. Surgical [P], colon, ascending, sigmoid, polyp (5) - TUBULAR ADENOMAS (5), NEGATIVE FOR HIGH-GRADE DYSPLASIA. 2. Surgical [P], colon, ascending, polyp (1) - TUBULAR ADENOMA, MULTIPLE  ADENOMATOUS FRAGMENTS, NEGATIVE FOR HIGH-GRADE DYSPLASIA.  Past Medical History:  Diagnosis Date   Allergy    seasonal   Anginal pain (HCC)    Anxiety    CHF (congestive heart failure) (HCC)    Coronary artery disease    Depression    Diabetes mellitus without complication (HCC)    Type 2   GERD (gastroesophageal reflux disease)    Pt states "take ambien for  this"   Hepatitis C    HLD (hyperlipidemia) 08/09/2017   Hypertension    Hypoglycemia    Pneumonia 06/2022   Stroke (HCC) 03/2021   TIA     Past Surgical History:  Procedure Laterality Date   APPENDECTOMY     BUBBLE STUDY  06/16/2021   Procedure: BUBBLE STUDY;  Surgeon: Sande Rives, MD;  Location: [facility] ENDOSCOPY;  Service: Cardiovascular;;   COLONOSCOPY     CORONARY ARTERY BYPASS GRAFT N/A 11/10/2022   Procedure: CORONARY ARTERY BYPASS GRAFTING (CABG) X5 USING LEFT INTERNAL MAMMARY ARTERY (LIMA) AND ENDOSCOPICALLY HARVESTED RIGHT AND LEFT GREATER SAPHENOUS VEIN;  Surgeon: Eugenio Hoes, MD;  Location: [facility] OR;  Service: Open Heart Surgery;  Laterality: N/A;   FRACTURE SURGERY Left    Ankle   LEFT HEART CATH AND CORONARY ANGIOGRAPHY N/A 07/12/2022   Procedure: LEFT HEART CATH AND CORONARY ANGIOGRAPHY;  Surgeon: Marykay Lex, MD;  Location: [facility] INVASIVE CV LAB;  Service: Cardiovascular;  Laterality: N/A;   TEE WITHOUT CARDIOVERSION N/A 06/16/2021   Procedure: TRANSESOPHAGEAL ECHOCARDIOGRAM (TEE);  Surgeon: Sande Rives, MD;  Location: [facility] ENDOSCOPY;  Service: Cardiovascular;  Laterality: N/A;   TEE WITHOUT CARDIOVERSION N/A 11/10/2022   Procedure: TRANSESOPHAGEAL ECHOCARDIOGRAM;  Surgeon: Eugenio Hoes, MD;  Location: [facility] OR;  Service: Open Heart Surgery;  Laterality: N/A;   TONSILECTOMY, ADENOIDECTOMY, BILATERAL MYRINGOTOMY AND TUBES  1955   Family History  Problem Relation Age of Onset   Lung disease Mother    Hyperlipidemia Father    Hypertension Father    Social History   Tobacco Use   Smoking status: Former   Smokeless tobacco: Never   Tobacco comments:    quit 57yrs ago, smoke a cigar occassionally.  Vaping Use   Vaping status: Never Used  Substance Use Topics   Alcohol use: Not Currently    Comment: Quit 30 years ago   Drug use: No   Current Outpatient Medications  Medication Sig Dispense Refill   aspirin EC 325 MG tablet Take 1 tablet (325 mg  total) by mouth daily.     atorvastatin (LIPITOR) 80 MG tablet Take 1 tablet (80 mg total) by mouth daily. 30 tablet 2   cholecalciferol (VITAMIN D3) 25 MCG (1000 UNIT) tablet Take 1 tablet (1,000 Units total) by mouth daily. Take 1,000 Units by mouth daily. 90 tablet 2   Evolocumab (REPATHA SURECLICK) 140 MG/ML SOAJ Inject 140 mg into the skin every 14 days. 6 mL 3   fenofibrate 160 MG tablet Take 1 tablet (160 mg total) by mouth daily. 90 tablet 1   fluticasone (FLONASE) 50 MCG/ACT nasal spray Place 2 sprays into both nostrils daily. 48 g 1   gabapentin (NEURONTIN) 300 MG capsule Take 1 capsule (300 mg total) by mouth at bedtime. 90 capsule 3   hydrALAZINE (APRESOLINE) 25 MG tablet Take 1 tablet (25 mg total) by mouth every 8 (eight) hours. 90 tablet 2   losartan (COZAAR) 100 MG tablet Take 1 tablet (100 mg total) by mouth daily. 180 tablet 1   magnesium  oxide (MAG-OX) 400 (240 Mg) MG tablet Take 400 mg by mouth daily.     Melatonin 10 MG TABS Take 10 mg by mouth at bedtime.     metFORMIN (GLUCOPHAGE-XR) 500 MG 24 hr tablet Take 1 tablet (500 mg) by mouth daily with breakfast. 180 tablet 1   metoprolol tartrate (LOPRESSOR) 25 MG tablet Take 1 tablet (25 mg total) by mouth 2 (two) times daily. 180 tablet 3   nitroGLYCERIN (NITROSTAT) 0.4 MG SL tablet Place 1 tablet under the tongue every 5 minutes as needed for chest pain. 25 tablet 3   PARoxetine (PAXIL) 40 MG tablet Take 1 tablet (40 mg total) by mouth every morning. 90 tablet 1   vitamin E 180 MG (400 UNITS) capsule Take 400 Units by mouth daily.     No current facility-administered medications for this visit.   No Known Allergies   Review of Systems: All systems reviewed and negative except where noted in HPI.    DG Chest 2 View  Result Date: 12/19/2022 CLINICAL DATA:  History of CABG EXAM: CHEST - 2 VIEW COMPARISON:  12/06/2022 FINDINGS: The heart size and mediastinal contours are within normal limits. Prior sternotomy and CABG.  Trace bilateral pleural effusions, unchanged in appearance. No focal airspace consolidation. No pneumothorax. The visualized skeletal structures are unremarkable. IMPRESSION: Trace bilateral pleural effusions, unchanged in appearance. Electronically Signed   By: Duanne Guess D.O.   On: 12/19/2022 14:00   DG Chest 2 View  Result Date: 12/06/2022 CLINICAL DATA:  Status post coronary artery bypass graft. EXAM: CHEST - 2 VIEW COMPARISON:  November 23, 2022. FINDINGS: The heart size and mediastinal contours are within normal limits. Sternotomy wires are noted. Right lung is clear. Minimal left basilar subsegmental atelectasis is noted with small left pleural effusion. The visualized skeletal structures are unremarkable. IMPRESSION: Minimal left basilar subsegmental atelectasis is noted with small left pleural effusion. Electronically Signed   By: Lupita Raider M.D.   On: 12/06/2022 14:23    Physical Exam: BP 136/80   Pulse 64   Ht 6' (1.829 m)   Wt 164 lb (74.4 kg)   BMI 22.24 kg/m  Constitutional: Pleasant,well-developed, Caucasian male in no acute distress.  Accompanied by girlfriend.  Able to climb onto exam table unassisted. HEENT: Normocephalic and atraumatic. Conjunctivae are normal. No scleral icterus. Neck supple.  Cardiovascular: Normal rate, regular rhythm.  Systolic murmur.  Well-healed sternotomy Pulmonary/chest: Effort normal and breath sounds normal. No wheezing, rales or rhonchi. Abdominal: Soft, nondistended, nontender. Bowel sounds active throughout. There are no masses palpable. No hepatomegaly. Extremities: no edema Neurological: Alert and oriented to person place and time. Skin: Skin is warm and dry. No rashes noted. Psychiatric: Normal mood and affect. Behavior is normal.  CBC    Component Value Date/Time   WBC 7.3 12/06/2022 1122   RBC 4.25 12/06/2022 1122   HGB 12.7 (L) 12/06/2022 1122   HGB 13.6 05/24/2021 0844   HCT 38.6 (L) 12/06/2022 1122   HCT 40.3 05/24/2021  0844   PLT 229.0 12/06/2022 1122   PLT 111 (L) 05/24/2021 0844   MCV 90.7 12/06/2022 1122   MCV 95 05/24/2021 0844   MCH 30.3 11/25/2022 0309   MCHC 32.8 12/06/2022 1122   RDW 16.5 (H) 12/06/2022 1122   RDW 12.0 05/24/2021 0844   LYMPHSABS 1.3 12/06/2022 1122   MONOABS 0.9 12/06/2022 1122   EOSABS 0.8 (H) 12/06/2022 1122   BASOSABS 0.1 12/06/2022 1122    CMP  Component Value Date/Time   NA 135 12/06/2022 1122   NA 137 05/24/2021 0844   K 4.7 12/06/2022 1122   CL 101 12/06/2022 1122   CO2 25 12/06/2022 1122   GLUCOSE 100 (H) 12/06/2022 1122   BUN 15 12/06/2022 1122   BUN 15 05/24/2021 0844   CREATININE 1.53 (H) 12/06/2022 1122   CALCIUM 10.1 12/06/2022 1122   PROT 7.1 12/06/2022 1122   PROT 7.0 08/26/2022 1106   ALBUMIN 4.2 12/06/2022 1122   ALBUMIN 4.7 08/26/2022 1106   AST 22 12/06/2022 1122   ALT 16 12/06/2022 1122   ALKPHOS 123 (H) 12/06/2022 1122   BILITOT 0.9 12/06/2022 1122   BILITOT 0.7 08/26/2022 1106   GFRNONAA 57 (L) 11/25/2022 0309       Latest Ref Rng & Units 12/06/2022   11:22 AM 11/25/2022    3:09 AM 11/24/2022    4:45 AM  CBC EXTENDED  WBC 4.0 - 10.5 K/uL 7.3  8.0  8.6   RBC 4.22 - 5.81 Mil/uL 4.25  3.66  3.66   Hemoglobin 13.0 - 17.0 g/dL 16.1  09.6  04.5   HCT 39.0 - 52.0 % 38.6  33.0  33.2   Platelets 150.0 - 400.0 K/uL 229.0  284  289   NEUT# 1.4 - 7.7 K/uL 4.2  5.2    Lymph# 0.7 - 4.0 K/uL 1.3  1.5        ASSESSMENT AND PLAN: 77 year old male found to have large flat polyp in the ascending colon removed piecemeal in October 2023.  Initially recommended to repeat colonoscopy in 6-9 months to reassess polypectomy site.  However, the patient was admitted in February 2024 with sepsis and NSTEMI, found to have multivessel disease, and subsequently underwent a 5 vessel CABG in June 2024.  He has recovered well from his surgery, but is still quite fragile and not back to his baseline. Although a repeat colonoscopy should certainly be considered,  it is not urgent, and given his recent surgery I would not recommend pursuing a colonoscopy at this time.  The polyp that was removed did not have any high-grade dysplasia, and it would likely be years before he would have the potential to turn into colon cancer.  I recommended we reevaluate him in 5-6 months to see how he is doing and to reassess the risk/benefits of a repeat colonoscopy. The patient and his girlfriend were both in agreement with this plan. For his colonoscopy, I agreed with him trying MiraLAX.  He can try taking it every day if needed, but if he runs into issues with loose stools, he can adjust dose as needed.  Alternatively, Metamucil may be a good option for him to be taken on a daily basis.  History of large colon polyp - Follow-up in 5 to 6 months to reassess risk/benefits of surveillance colonoscopy  Constipation - Start MiraLAX, titrate dose as needed to achieve 1 bowel movement every 1-2 days - If issues with loose stools/diarrhea, would recommend trial of daily Metamucil  Coronary artery disease status post 5 vessel CABG June 2024 - Cleared from cardiothoracic surgery standpoint - Has follow-up with cardiology next month   Fayth Trefry E. Tomasa Rand, MD [facility]  Corwin Levins, MD

## 2023-01-06 ENCOUNTER — Other Ambulatory Visit: Payer: Self-pay

## 2023-01-06 ENCOUNTER — Other Ambulatory Visit: Payer: Self-pay | Admitting: Internal Medicine

## 2023-01-06 ENCOUNTER — Other Ambulatory Visit (HOSPITAL_COMMUNITY): Payer: Self-pay

## 2023-01-06 MED ORDER — ATORVASTATIN CALCIUM 80 MG PO TABS
80.0000 mg | ORAL_TABLET | Freq: Every day | ORAL | 2 refills | Status: DC
  Filled 2023-01-06: qty 30, 30d supply, fill #0
  Filled 2023-01-27 – 2023-01-30 (×2): qty 30, 30d supply, fill #1
  Filled 2023-03-04 (×2): qty 30, 30d supply, fill #2

## 2023-01-09 ENCOUNTER — Other Ambulatory Visit (HOSPITAL_COMMUNITY): Payer: Self-pay

## 2023-01-12 ENCOUNTER — Other Ambulatory Visit: Payer: Self-pay | Admitting: Nurse Practitioner

## 2023-01-12 ENCOUNTER — Other Ambulatory Visit (HOSPITAL_COMMUNITY): Payer: Self-pay

## 2023-01-12 DIAGNOSIS — I25118 Atherosclerotic heart disease of native coronary artery with other forms of angina pectoris: Secondary | ICD-10-CM

## 2023-01-17 ENCOUNTER — Other Ambulatory Visit: Payer: 59

## 2023-01-17 DIAGNOSIS — M810 Age-related osteoporosis without current pathological fracture: Secondary | ICD-10-CM | POA: Diagnosis not present

## 2023-01-18 ENCOUNTER — Other Ambulatory Visit: Payer: Self-pay

## 2023-01-18 ENCOUNTER — Ambulatory Visit: Payer: 59 | Admitting: Internal Medicine

## 2023-01-18 ENCOUNTER — Other Ambulatory Visit (HOSPITAL_COMMUNITY): Payer: Self-pay

## 2023-01-19 ENCOUNTER — Other Ambulatory Visit: Payer: 59

## 2023-01-20 ENCOUNTER — Other Ambulatory Visit: Payer: Self-pay

## 2023-01-23 ENCOUNTER — Other Ambulatory Visit (HOSPITAL_COMMUNITY): Payer: Self-pay

## 2023-01-27 ENCOUNTER — Ambulatory Visit (INDEPENDENT_AMBULATORY_CARE_PROVIDER_SITE_OTHER): Payer: 59 | Admitting: Internal Medicine

## 2023-01-27 ENCOUNTER — Other Ambulatory Visit (HOSPITAL_COMMUNITY): Payer: Self-pay

## 2023-01-27 VITALS — BP 120/68 | HR 82 | Temp 97.5°F | Ht 72.0 in | Wt 170.0 lb

## 2023-01-27 DIAGNOSIS — Z23 Encounter for immunization: Secondary | ICD-10-CM

## 2023-01-27 DIAGNOSIS — E785 Hyperlipidemia, unspecified: Secondary | ICD-10-CM | POA: Diagnosis not present

## 2023-01-27 DIAGNOSIS — R269 Unspecified abnormalities of gait and mobility: Secondary | ICD-10-CM | POA: Diagnosis not present

## 2023-01-27 DIAGNOSIS — Z7984 Long term (current) use of oral hypoglycemic drugs: Secondary | ICD-10-CM

## 2023-01-27 DIAGNOSIS — I10 Essential (primary) hypertension: Secondary | ICD-10-CM | POA: Diagnosis not present

## 2023-01-27 DIAGNOSIS — E1165 Type 2 diabetes mellitus with hyperglycemia: Secondary | ICD-10-CM

## 2023-01-27 DIAGNOSIS — N1831 Chronic kidney disease, stage 3a: Secondary | ICD-10-CM

## 2023-01-27 DIAGNOSIS — E559 Vitamin D deficiency, unspecified: Secondary | ICD-10-CM

## 2023-01-27 NOTE — Patient Instructions (Addendum)
You are given the handicapped parking application signed today  You had the flu shot today  Please continue all other medications as before, and refills have been done if requested.  Please have the pharmacy call with any other refills you may need.  Please continue your efforts at being more active, low cholesterol diet, and weight control.  Please keep your appointments with your specialists as you may have planned  Please make an Appointment to return in 6 months, or sooner if needed

## 2023-01-27 NOTE — Progress Notes (Unsigned)
Patient ID: Collin Bridges, male   DOB: Mar 19, 1946, 77 y.o.   MRN: [id number]        Chief Complaint: follow up HTN, HLD and hyperglycemia ***       HPI:  Collin Bridges is a 77 y.o. male here with c/o        Wt Readings from Last 3 Encounters:  01/27/23 170 lb (77.1 kg)  01/05/23 164 lb (74.4 kg)  12/19/22 166 lb (75.3 kg)   BP Readings from Last 3 Encounters:  01/27/23 120/68  01/05/23 136/80  12/19/22 (!) 113/58         Past Medical History:  Diagnosis Date   Allergy    seasonal   Anginal pain (HCC)    Anxiety    CHF (congestive heart failure) (HCC)    Coronary artery disease    Depression    Diabetes mellitus without complication (HCC)    Type 2   GERD (gastroesophageal reflux disease)    Pt states "take ambien for this"   Hepatitis C    HLD (hyperlipidemia) 08/09/2017   Hypertension    Hypoglycemia    Pneumonia 06/2022   Stroke (HCC) 03/2021   TIA   Past Surgical History:  Procedure Laterality Date   APPENDECTOMY     BUBBLE STUDY  06/16/2021   Procedure: BUBBLE STUDY;  Surgeon: Sande Rives, MD;  Location: [facility] ENDOSCOPY;  Service: Cardiovascular;;   COLONOSCOPY     CORONARY ARTERY BYPASS GRAFT N/A 11/10/2022   Procedure: CORONARY ARTERY BYPASS GRAFTING (CABG) X5 USING LEFT INTERNAL MAMMARY ARTERY (LIMA) AND ENDOSCOPICALLY HARVESTED RIGHT AND LEFT GREATER SAPHENOUS VEIN;  Surgeon: Eugenio Hoes, MD;  Location: [facility] OR;  Service: Open Heart Surgery;  Laterality: N/A;   FRACTURE SURGERY Left    Ankle   LEFT HEART CATH AND CORONARY ANGIOGRAPHY N/A 07/12/2022   Procedure: LEFT HEART CATH AND CORONARY ANGIOGRAPHY;  Surgeon: Marykay Lex, MD;  Location: [facility] INVASIVE CV LAB;  Service: Cardiovascular;  Laterality: N/A;   TEE WITHOUT CARDIOVERSION N/A 06/16/2021   Procedure: TRANSESOPHAGEAL ECHOCARDIOGRAM (TEE);  Surgeon: Sande Rives, MD;  Location: [facility] ENDOSCOPY;  Service: Cardiovascular;  Laterality: N/A;   TEE WITHOUT CARDIOVERSION N/A  11/10/2022   Procedure: TRANSESOPHAGEAL ECHOCARDIOGRAM;  Surgeon: Eugenio Hoes, MD;  Location: [facility] OR;  Service: Open Heart Surgery;  Laterality: N/A;   TONSILECTOMY, ADENOIDECTOMY, BILATERAL MYRINGOTOMY AND TUBES  1955    reports that he has quit smoking. He has never used smokeless tobacco. He reports that he does not currently use alcohol. He reports that he does not use drugs. family history includes Hyperlipidemia in his father; Hypertension in his father; Lung disease in his mother. No Known Allergies Current Outpatient Medications on File Prior to Visit  Medication Sig Dispense Refill   aspirin EC 325 MG tablet Take 1 tablet (325 mg total) by mouth daily.     atorvastatin (LIPITOR) 80 MG tablet Take 1 tablet (80 mg total) by mouth daily. 30 tablet 2   cholecalciferol (VITAMIN D3) 25 MCG (1000 UNIT) tablet Take 1 tablet (1,000 Units total) by mouth daily. Take 1,000 Units by mouth daily. 90 tablet 2   Evolocumab (REPATHA SURECLICK) 140 MG/ML SOAJ Inject 140 mg into the skin every 14 days. 6 mL 3   fenofibrate 160 MG tablet Take 1 tablet (160 mg total) by mouth daily. 90 tablet 1   fluticasone (FLONASE) 50 MCG/ACT nasal spray Place 2 sprays into both nostrils daily. 48 g 1  gabapentin (NEURONTIN) 300 MG capsule Take 1 capsule (300 mg total) by mouth at bedtime. 90 capsule 3   hydrALAZINE (APRESOLINE) 25 MG tablet TAKE 1 TABLET(25 MG) BY MOUTH EVERY 8 HOURS 90 tablet 2   losartan (COZAAR) 100 MG tablet Take 1 tablet (100 mg total) by mouth daily. 180 tablet 1   magnesium oxide (MAG-OX) 400 (240 Mg) MG tablet Take 400 mg by mouth daily.     Melatonin 10 MG TABS Take 10 mg by mouth at bedtime.     metFORMIN (GLUCOPHAGE-XR) 500 MG 24 hr tablet Take 1 tablet (500 mg) by mouth daily with breakfast. 180 tablet 1   metoprolol tartrate (LOPRESSOR) 25 MG tablet Take 1 tablet (25 mg total) by mouth 2 (two) times daily. 180 tablet 3   nitroGLYCERIN (NITROSTAT) 0.4 MG SL tablet Place 1 tablet under the  tongue every 5 minutes as needed for chest pain. 25 tablet 3   PARoxetine (PAXIL) 40 MG tablet Take 1 tablet (40 mg total) by mouth every morning. 90 tablet 1   vitamin E 180 MG (400 UNITS) capsule Take 400 Units by mouth daily.     No current facility-administered medications on file prior to visit.        ROS:  All others reviewed and negative.  Objective        PE:  BP 120/68 (BP Location: Left Arm, Patient Position: Sitting, Cuff Size: Large)   Pulse 82   Temp (!) 97.5 F (36.4 C) (Oral)   Ht 6' (1.829 m)   Wt 170 lb (77.1 kg)   SpO2 96%   BMI 23.06 kg/m                 Constitutional: Pt appears in NAD               HENT: Head: NCAT.                Right Ear: External ear normal.                 Left Ear: External ear normal.                Eyes: . Pupils are equal, round, and reactive to light. Conjunctivae and EOM are normal               Nose: without d/c or deformity               Neck: Neck supple. Gross normal ROM               Cardiovascular: Normal rate and regular rhythm.                 Pulmonary/Chest: Effort normal and breath sounds without rales or wheezing.                Abd:  Soft, NT, ND, + BS, no organomegaly               Neurological: Pt is alert. At baseline orientation, motor grossly intact               Skin: Skin is warm. No rashes, no other new lesions, LE edema - ***               Psychiatric: Pt behavior is normal without agitation   Micro: none  Cardiac tracings I have personally interpreted today:  none  Pertinent Radiological findings (summarize): none   Lab Results  Component Value Date  WBC 7.3 12/06/2022   HGB 12.7 (L) 12/06/2022   HCT 38.6 (L) 12/06/2022   PLT 229.0 12/06/2022   GLUCOSE 100 (H) 12/06/2022   CHOL 88 (L) 10/31/2022   TRIG 122 10/31/2022   HDL 49 10/31/2022   LDLDIRECT 146.0 06/03/2022   LDLCALC 17 10/31/2022   ALT 16 12/06/2022   AST 22 12/06/2022   NA 135 12/06/2022   K 4.7 12/06/2022   CL 101 12/06/2022    CREATININE 1.53 (H) 12/06/2022   BUN 15 12/06/2022   CO2 25 12/06/2022   TSH 0.98 06/03/2022   PSA 3.06 06/03/2022   INR 1.3 (H) 11/22/2022   HGBA1C 5.5 12/06/2022   MICROALBUR <0.7 06/03/2022   Assessment/Plan:  Felipe Voytko is a 77 y.o. White or Caucasian [1] male with  has a past medical history of Allergy, Anginal pain (HCC), Anxiety, CHF (congestive heart failure) (HCC), Coronary artery disease, Depression, Diabetes mellitus without complication (HCC), GERD (gastroesophageal reflux disease), Hepatitis C, HLD (hyperlipidemia) (08/09/2017), Hypertension, Hypoglycemia, Pneumonia (06/2022), and Stroke (HCC) (03/2021).  No problem-specific Assessment & Plan notes found for this encounter.  Followup: No follow-ups on file.  Oliver Barre, MD 01/27/2023 4:33 PM [facility]

## 2023-01-29 ENCOUNTER — Encounter: Payer: Self-pay | Admitting: Internal Medicine

## 2023-01-29 DIAGNOSIS — R269 Unspecified abnormalities of gait and mobility: Secondary | ICD-10-CM | POA: Insufficient documentation

## 2023-01-29 NOTE — Assessment & Plan Note (Signed)
BP Readings from Last 3 Encounters:  01/27/23 120/68  01/05/23 136/80  12/19/22 (!) 113/58   Stable, pt to continue medical treatment hydralzine 25 tid, losartan 100 every day, lopressor 25 bid

## 2023-01-29 NOTE — Assessment & Plan Note (Signed)
Last vitamin D Lab Results  Component Value Date   VD25OH 79.43 12/06/2022   Stable, cont oral replacement

## 2023-01-29 NOTE — Assessment & Plan Note (Signed)
Worsening secondary to multiple co-moribds - for handicapped parking app signed

## 2023-01-29 NOTE — Assessment & Plan Note (Signed)
Lab Results  Component Value Date   LDLCALC 17 10/31/2022   Stable, pt to continue current statin lipitor 80 and repatha 140 mg

## 2023-01-29 NOTE — Assessment & Plan Note (Signed)
Lab Results  Component Value Date   HGBA1C 5.5 12/06/2022   Stable, pt to continue current medical treatment metformin ER 500 mg qd

## 2023-01-29 NOTE — Assessment & Plan Note (Signed)
Lab Results  Component Value Date   CREATININE 1.53 (H) 12/06/2022   Stable overall, cont to avoid nephrotoxins

## 2023-02-13 ENCOUNTER — Other Ambulatory Visit: Payer: Self-pay | Admitting: Internal Medicine

## 2023-03-01 NOTE — Progress Notes (Unsigned)
Cardiology Office Note:  .   Date:  03/02/2023  ID:  Collin Bridges, Collin Bridges 08-24-1945, MRN [id number] PCP: Corwin Levins, MD  [facility] Providers Cardiologist:  Reatha Harps, MD  History of Present Illness: .   Collin Bridges is a 77 y.o. male with below history who presents for follow-up.   Discussed the use of AI scribe software for clinical note transcription with the patient, who gave verbal consent to proceed.  History of Present Illness   Collin Bridges, a patient with a history of coronary artery disease, presents for a follow-up visit after a heart attack in February and subsequent five-vessel bypass surgery in June. The surgery was delayed due to a stroke and pneumonia. Since the surgery, he reports doing well and has been released by the surgeons. He is currently on a 325mg  aspirin regimen, which needs to be reduced to 81mg . He is also due to restart Plavix for one year post-heart attack. His cholesterol is well-controlled with Repatha, and his blood pressure is 140/70. He does not regularly check his blood pressure at home but reports no issues. His girlfriend has noticed swelling in his foot, which he has been managing by elevating his feet and wearing compression socks. He denies any chest pain or trouble breathing and reports regular exercise, including leg weights and dumbbell curls.          Problem List CAD -NSTEMI 06/2022 -CABG x 5 10/21/2022 2. HLD -T chol 88, HDL 49, LDL 17, TG 122 3. Postoperative Afib  4. HTN 5. Aortic stenosis -mild 11/24/2022 6. CVA -bilateral cerebral infarcts 02/2021 -severe intracranial atherosclerosis/lacunar infarct  7. Meningioma  8. Cirrhosis 9. HFpEF 10. CKD IIIa    ROS: All other ROS reviewed and negative. Pertinent positives noted in the HPI.     Studies Reviewed: Marland Kitchen        TTE 11/24/2022  1. Left ventricular ejection fraction, by estimation, is 60 to 65%. The  left ventricle has normal function. The left  ventricle has no regional  wall motion abnormalities. Diastolic function indeterminant due to  significant MAC.   2. Right ventricular systolic function is normal. The right ventricular  size is normal. There is normal pulmonary artery systolic pressure. The  estimated right ventricular systolic pressure is 15.1 mmHg.   3. A small pericardial effusion is present. There is no evidence of  cardiac tamponade. Moderate pleural effusion in the left lateral region.   4. The mitral valve is degenerative. Trivial mitral valve regurgitation.  Moderate mitral annular calcification.   5. The aortic valve is tricuspid. There is moderate calcification of the  aortic valve. There is moderate thickening of the aortic valve. Aortic  valve regurgitation is not visualized. Mild aortic valve stenosis. Aortic  valve mean gradient measures 10.4  mmHg. Aortic valve Vmax measures 2.40 m/s.   6. Aortic dilatation noted. There is borderline dilatation of the  ascending aorta, measuring 38 mm.   7. The inferior vena cava is normal in size with greater than 50%  respiratory variability, suggesting right atrial pressure of 3 mmHg.  Physical Exam:   VS:  BP (!) 140/70 (BP Location: Left Arm, Patient Position: Sitting, Cuff Size: Normal)   Pulse 79   Ht 6' (1.829 m)   Wt 180 lb 6.4 oz (81.8 kg)   SpO2 96%   BMI 24.47 kg/m    Wt Readings from Last 3 Encounters:  03/02/23 180 lb 6.4 oz (81.8 kg)  01/27/23  170 lb (77.1 kg)  01/05/23 164 lb (74.4 kg)    GEN: Well nourished, well developed in no acute distress NECK: No JVD; No carotid bruits CARDIAC: RRR, no murmurs, rubs, gallops RESPIRATORY:  Clear to auscultation without rales, wheezing or rhonchi  ABDOMEN: Soft, non-tender, non-distended EXTREMITIES: 2+ pitting edema LE bilaterally  ASSESSMENT AND PLAN: .   Assessment and Plan    Coronary Artery Disease (CAD) status post 5-vessel CABG No chest pain or trouble breathing reported. LDL well controlled on  Repatha, Lipitor, and Fenofibrate. -Reduce Aspirin to 81mg  daily. -Resume Plavix 75mg  daily for one year post-heart attack. -lipids at goal   Postop Afib -after CABG. No recurrence. Continue metoprolol. No need for long-term AC.   Lower Extremity Edema Chronic HFpEF Likely related to diastolic heart failure. Blood pressure 140/70. -Start Lasix 20mg  daily. -Check BMP and BNP today.  Hypertension Blood pressure 140/70. -Continue current regimen and monitor.  Chronic Kidney Disease (CKD) Stage 3A Need to closely follow this. -Check BMP today.   Diabetes -A1c 5.5 At goal.    History of Stroke -small vessel disease 2/2 DM and HTN. ASA/lipid lowering agents.   Follow-up -See PA in 3 months. -See me in 6 months.              Follow-up: Return in about 3 months (around 06/02/2023).  Time Spent with Patient: I have spent a total of 35 minutes with patient reviewing hospital notes, telemetry, EKGs, labs and examining the patient as well as establishing an assessment and plan that was discussed with the patient.  > 50% of time was spent in direct patient care.  Signed, Lenna Gilford. Flora Lipps, MD, [facility]  [facility]  [address], [address] [place], [place] [address] [phone]  10:07 AM

## 2023-03-02 ENCOUNTER — Other Ambulatory Visit: Payer: Self-pay

## 2023-03-02 ENCOUNTER — Ambulatory Visit: Payer: 59 | Attending: Cardiovascular Disease | Admitting: Cardiovascular Disease

## 2023-03-02 ENCOUNTER — Other Ambulatory Visit (HOSPITAL_COMMUNITY): Payer: Self-pay

## 2023-03-02 ENCOUNTER — Encounter: Payer: Self-pay | Admitting: Pharmacist

## 2023-03-02 ENCOUNTER — Encounter: Payer: Self-pay | Admitting: Cardiovascular Disease

## 2023-03-02 VITALS — BP 140/70 | HR 79 | Ht 72.0 in | Wt 180.4 lb

## 2023-03-02 DIAGNOSIS — Z951 Presence of aortocoronary bypass graft: Secondary | ICD-10-CM | POA: Diagnosis not present

## 2023-03-02 DIAGNOSIS — I4891 Unspecified atrial fibrillation: Secondary | ICD-10-CM

## 2023-03-02 DIAGNOSIS — I9789 Other postprocedural complications and disorders of the circulatory system, not elsewhere classified: Secondary | ICD-10-CM

## 2023-03-02 DIAGNOSIS — I25118 Atherosclerotic heart disease of native coronary artery with other forms of angina pectoris: Secondary | ICD-10-CM

## 2023-03-02 DIAGNOSIS — I150 Renovascular hypertension: Secondary | ICD-10-CM

## 2023-03-02 DIAGNOSIS — R600 Localized edema: Secondary | ICD-10-CM

## 2023-03-02 DIAGNOSIS — I350 Nonrheumatic aortic (valve) stenosis: Secondary | ICD-10-CM

## 2023-03-02 DIAGNOSIS — E785 Hyperlipidemia, unspecified: Secondary | ICD-10-CM | POA: Diagnosis not present

## 2023-03-02 DIAGNOSIS — I5032 Chronic diastolic (congestive) heart failure: Secondary | ICD-10-CM

## 2023-03-02 MED ORDER — CLOPIDOGREL BISULFATE 75 MG PO TABS
75.0000 mg | ORAL_TABLET | Freq: Every day | ORAL | 3 refills | Status: DC
  Filled 2023-03-02 – 2023-03-24 (×4): qty 90, 90d supply, fill #0
  Filled 2023-12-11: qty 90, 90d supply, fill #1

## 2023-03-02 MED ORDER — ASPIRIN 81 MG PO TBEC
81.0000 mg | DELAYED_RELEASE_TABLET | Freq: Every day | ORAL | 12 refills | Status: AC
  Filled 2023-03-02 – 2023-05-29 (×2): qty 30, 30d supply, fill #0

## 2023-03-02 MED ORDER — FUROSEMIDE 20 MG PO TABS
20.0000 mg | ORAL_TABLET | Freq: Every day | ORAL | 3 refills | Status: DC
  Filled 2023-03-02 – 2023-03-09 (×2): qty 90, 90d supply, fill #0
  Filled 2023-05-18: qty 90, 90d supply, fill #1
  Filled 2023-08-07: qty 90, 90d supply, fill #2
  Filled 2023-11-06: qty 90, 90d supply, fill #3

## 2023-03-02 NOTE — Patient Instructions (Signed)
Medication Instructions:  - Stop 325mg  aspirin -START 81MG  ASPIRIN, ONCE DAILY - START PLAVIX 75MG , ONCE DAILY - START LASIX 20MG ,ONCE DAILY    *If you need a refill on your cardiac medications before your next appointment, please call your pharmacy*   Lab Work: - BMET  - B natriuretic peptide    If you have labs (blood work) drawn today and your tests are completely normal, you will receive your results only by: MyChart Message (if you have MyChart) OR A paper copy in the mail If you have any lab test that is abnormal or we need to change your treatment, we will call you to review the results.   Testing/Procedures: None    Follow-Up: At [facility], you and your health needs are our priority.  As part of our continuing mission to provide you with exceptional heart care, we have created designated Provider Care Teams.  These Care Teams include your primary Cardiologist (physician) and Advanced Practice Providers (APPs -  Physician Assistants and Nurse Practitioners) who all work together to provide you with the care you need, when you need it.  We recommend signing up for the patient portal called "MyChart".  Sign up information is provided on this After Visit Summary.  MyChart is used to connect with patients for Virtual Visits (Telemedicine).  Patients are able to view lab/test results, encounter notes, upcoming appointments, etc.  Non-urgent messages can be sent to your provider as well.   To learn more about what you can do with MyChart, go to [email or web address].    Your next appointment:   3 month(s)  The format for your next appointment:   In Person  Provider:   [name], FNP, Micah Flesher, PA-C, Marjie Skiff, PA-C, Robet Leu, PA-C, Juanda Crumble, PA-C, Joni Reining, DNP, ANP, Azalee Course, PA-C, Bernadene Person, NP, or Reather Littler, NP    Then, Reatha Harps, MD will plan to see you again in 6 month(s).

## 2023-03-03 LAB — BASIC METABOLIC PANEL
BUN/Creatinine Ratio: 11 (ref 10–24)
BUN: 15 mg/dL (ref 8–27)
CO2: 26 mmol/L (ref 20–29)
Calcium: 9.4 mg/dL (ref 8.6–10.2)
Chloride: 106 mmol/L (ref 96–106)
Creatinine, Ser: 1.31 mg/dL — ABNORMAL HIGH (ref 0.76–1.27)
Glucose: 100 mg/dL — ABNORMAL HIGH (ref 70–99)
Potassium: 5 mmol/L (ref 3.5–5.2)
Sodium: 144 mmol/L (ref 134–144)
eGFR: 56 mL/min/{1.73_m2} — ABNORMAL LOW (ref >59)

## 2023-03-03 LAB — BRAIN NATRIURETIC PEPTIDE: BNP: 142.9 pg/mL — ABNORMAL HIGH (ref 0.0–100.0)

## 2023-03-04 ENCOUNTER — Other Ambulatory Visit (HOSPITAL_COMMUNITY): Payer: Self-pay

## 2023-03-07 ENCOUNTER — Encounter (HOSPITAL_BASED_OUTPATIENT_CLINIC_OR_DEPARTMENT_OTHER): Payer: Self-pay

## 2023-03-07 ENCOUNTER — Other Ambulatory Visit (HOSPITAL_COMMUNITY): Payer: Self-pay

## 2023-03-07 ENCOUNTER — Other Ambulatory Visit: Payer: Self-pay

## 2023-03-07 MED ORDER — KETOROLAC TROMETHAMINE 0.5 % OP SOLN
1.0000 [drp] | Freq: Four times a day (QID) | OPHTHALMIC | 1 refills | Status: DC
  Filled 2023-03-07: qty 10, 50d supply, fill #0
  Filled 2023-05-29: qty 10, 50d supply, fill #1

## 2023-03-07 MED ORDER — GATIFLOXACIN 0.5 % OP SOLN
1.0000 [drp] | Freq: Four times a day (QID) | OPHTHALMIC | 1 refills | Status: DC
  Filled 2023-03-07: qty 5, 25d supply, fill #0
  Filled 2023-05-29: qty 5, 25d supply, fill #1

## 2023-03-07 MED ORDER — PREDNISOLONE ACETATE 1 % OP SUSP
1.0000 [drp] | Freq: Four times a day (QID) | OPHTHALMIC | 1 refills | Status: DC
  Filled 2023-03-07: qty 5, 25d supply, fill #0
  Filled 2023-05-29: qty 5, 25d supply, fill #1

## 2023-03-08 ENCOUNTER — Other Ambulatory Visit: Payer: Self-pay

## 2023-03-08 ENCOUNTER — Other Ambulatory Visit (HOSPITAL_COMMUNITY): Payer: Self-pay

## 2023-03-08 ENCOUNTER — Telehealth: Payer: Self-pay | Admitting: *Deleted

## 2023-03-08 NOTE — Telephone Encounter (Signed)
Pre-operative Risk Assessment    Patient Name: Nohe Kuczmarski  DOB: 1945/10/15 MRN: [id number]  DATE OF LAST VISIT: 03/02/23 DR. O'NEAL DATE OF NEXT VISIT: 06/02/23 [name], FNP     Request for Surgical Clearance    Procedure:   CATARACT EXTRACTION WITH INTRAOCULAR LENS IMPLANT OF LEFT EYE; FOLLOWED BY THE RIGHT EYE  Date of Surgery:  Clearance 05/22/23                                 Surgeon:  DR. Mia Creek Surgeon's Group or Practice Name:  [facility] Phone number:  [phone] Fax number:  [phone]   Type of Clearance Requested:   - Medical ; PER CLEARANCE FORM NO MEDICATIONS ARE NEEDING TO BE HELD INCLUDING BLOOD THINNERS   Type of Anesthesia:   TOPICAL ANESTHESIA WITN IV MEDICATION   Additional requests/questions:    Elpidio Anis   03/08/2023, 5:22 PM

## 2023-03-09 ENCOUNTER — Other Ambulatory Visit: Payer: Self-pay

## 2023-03-09 ENCOUNTER — Other Ambulatory Visit (HOSPITAL_COMMUNITY): Payer: Self-pay

## 2023-03-09 NOTE — Telephone Encounter (Signed)
Patient Name: Collin Bridges  DOB: [birth year] MRN: [id number]  Primary Cardiologist: Reatha Harps, MD  Chart reviewed as part of pre-operative protocol coverage. Cataract extractions are recognized in guidelines as low risk surgeries that do not typically require specific preoperative testing or holding of blood thinner therapy. Therefore, given past medical history and time since last visit, based on ACC/AHA guidelines, Collin Bridges would be at acceptable risk for the planned procedure without further cardiovascular testing.   I will route this recommendation to the requesting party via Epic fax function and remove from pre-op pool.  Please call with questions.  Levi Aland, NP-C  03/09/2023, 8:39 AM [address] Office [phone] Fax [phone]

## 2023-03-13 ENCOUNTER — Encounter: Payer: Self-pay | Admitting: Cardiovascular Disease

## 2023-03-15 ENCOUNTER — Telehealth: Payer: Self-pay | Admitting: *Deleted

## 2023-03-15 NOTE — Telephone Encounter (Signed)
Patient Name: Collin Bridges  DOB: 05-28-1945 MRN: [id number]  Primary Cardiologist: Reatha Harps, MD  Chart reviewed as part of pre-operative protocol coverage.    Simple dental extractions (i.e. 1-2 teeth) are considered low risk procedures per guidelines and generally do not require any specific cardiac clearance. It is also generally accepted that for simple extractions and dental cleanings, there is no need to interrupt blood thinner therapy.  SBE prophylaxis is not required for the patient from a cardiac standpoint.  I will route this recommendation to the requesting party via Epic fax function and remove from pre-op pool.  Please call with questions.  Napoleon Form, Leodis Rains, NP 03/15/2023, 11:37 AM

## 2023-03-15 NOTE — Telephone Encounter (Signed)
Pre-operative Risk Assessment    Patient Name: Collin Bridges  DOB: 11-Aug-1945 MRN: [id number]    DATE OF LAST VISIT: 03/02/23 DR. O'NEAL DATE OF NEXT VISIT: 06/02/23 [name], FNP Request for Surgical Clearance    Procedure:   REGULAR DENTAL CLEANING AT THIS TIME  Date of Surgery:  Clearance TBD                                 Surgeon: DR. Reece Leader, DMD Surgeon's Group or Practice Name:  [facility] Phone number:  [phone] Fax number:  [phone]   Type of Clearance Requested:   - Medical  - Pharmacy:  Hold Aspirin and Clopidogrel (Plavix)     Type of Anesthesia:  None    Additional requests/questions:    Elpidio Anis   03/15/2023, 11:21 AM

## 2023-03-24 ENCOUNTER — Other Ambulatory Visit: Payer: Self-pay

## 2023-03-24 ENCOUNTER — Other Ambulatory Visit (HOSPITAL_COMMUNITY): Payer: Self-pay

## 2023-03-24 ENCOUNTER — Encounter: Payer: Self-pay | Admitting: Gastroenterology

## 2023-03-27 ENCOUNTER — Encounter: Payer: Self-pay | Admitting: Neurology

## 2023-03-27 ENCOUNTER — Ambulatory Visit (INDEPENDENT_AMBULATORY_CARE_PROVIDER_SITE_OTHER): Payer: 59 | Admitting: Neurology

## 2023-03-27 VITALS — BP 123/76 | HR 92 | Ht 72.0 in | Wt 179.0 lb

## 2023-03-27 DIAGNOSIS — G3184 Mild cognitive impairment, so stated: Secondary | ICD-10-CM

## 2023-03-27 DIAGNOSIS — Z8673 Personal history of transient ischemic attack (TIA), and cerebral infarction without residual deficits: Secondary | ICD-10-CM

## 2023-03-27 DIAGNOSIS — I671 Cerebral aneurysm, nonruptured: Secondary | ICD-10-CM

## 2023-03-27 NOTE — Patient Instructions (Addendum)
I had a long discussion with the patient regarding his recent lacunar strokes and mild cognitive impairment and discussed about stroke prevention and treatment and answered questions.  We also discussed his mild cognitive impairment which appears stable .  Continue aspirin and Plavix given his significant coronary artery disease and aggressive risk factor modification with strict control of hypertension with blood pressure goal below 140/90, lipids with LDL cholesterol goal below 70 mg percent and see goal below 6.5%..  I encouraged him to increase participation in cognitively challenging activities like solving crossword puzzles, doing word searches, playing bridge and other cognitively challenging activities.  We also discussed memory compensation strategies.  He will return for follow-up in the future in 1 year or call earlier if necessary.Marland Kitchen

## 2023-03-27 NOTE — Progress Notes (Signed)
[facility] [address] [place]. [place] [address] [phone]       OFFICE FOLLOW-UP NOTE  [name] Date of Birth:  1946-03-14 Medical Record Number:  [id number]   HPI: Initial visit 07/22/2021 :[name] is a pleasant 77 year old Caucasian male initial office follow-up visit following initial hospital consultation visit for stroke in November 2022.  History is obtained from the patient and review of electronic medical records.  I have also personally reviewed available pertinent imaging films in  PACS.Collin Bridges is a 77 y.o. male with a medical history significant for anxiety, depression, hepatitis C, chronic kidney disease stage IIIa, hyperlipidemia, essential hypertension who presented to the ED 11/23 after being sent by his primary care provider due to abnormal MRI report results.  Patient states that he fell on March 10, 2021 in the shower after he slipped sustaining a laceration requiring sutures to the right frontal scalp.  He states he his neck paralyzed from neck down for couple of hours and could not move call for help for a few hours but fortunately recovered.At that time CT imaging revealed a small meningioma below the anterior aspect of the tentorium on the left and was discharged with recommendation for MRI follow-up.  Patient had MRI brain outpatient on 11/19 with results on 11/22 revealing subcentimeter subacute bilateral cerebral infarcts with a 12 mm mass along the left tentorium compatible with a meningioma without mass-effect or edema.  MRI imaging also revealed severe chronic small vessel ischemic disease and patient was asked by his primary provider to come to the ED for further evaluation and work-up of suspected stroke. At baseline patient lives independently, states he does not have any living family members and he does not drive.  He is able to complete his ADLs independently and walks without a walker.  Patient states that he  has been on 325 mg of aspirin for approximately 30 years.  He also endorses blurry vision after falling that resolved after 5 days.  MRI scan of the brain showed tiny suspected subcentimeter cerebellar, left parietal and right frontal subcortical white matter acute infarcts.  12 mm left tentorium likely meningioma.  There was severe changes of chronic small vessel disease.  MRA of the neck was unremarkable.  MRA of the brain with degenerative right posterior cerebral artery aplastic versus occluded A1 segment of the right.  2D echo showed ejection fraction 60 to 65% with suspected calcific beneath the  mitral valve.  LDL cholesterol was 110 mg percent and A1c was 5.8.  Patient was started on dual antiplatelet therapy aspirin and Plavix tolerating well with only minor bruising.   He has not had any recurrent TIA or stroke symptoms.  His blood pressure is under good control.  He is tolerating Lipitor well without muscle aches and pains.  He had outpatient TEE on 06/16/2021 which confirmed calcified chordal tissue under the mitral valve apparatus.  Likely a benign finding.  Redundancy of the interatrial septum but no right-to-left shunt noted.  There was grade 3 aortic.arch plaque noted.  No clot or PFO seen. Update 02/14/2022 : He returns for follow-up after last visit 6 months ago.  Patient states is doing well and had no recurrent stroke or TIA symptoms.  Remains on Plavix which is tolerating well without bruising or bleeding.  He states his blood pressure continues to be high and is working with his primary care physician and today it is elevated at 176/76 in office.  He is  tolerating Lipitor well without side effects.  Last lipid profile on 12/01/2021 showed total cholesterol 158, triglycerides 269, HDL 31 and LDL 126.  Sugars also remain high and last hemoglobin A1c 2 months ago was 7.5.  Patient has new complaints of short-term memory difficulties which he states is for several months now.  He has difficulty  remembering names.  But may remember them sometimes later.  Living with his girlfriend but is mostly independent actives of daily living.  Is responsible for his own medications.  Given up driving years ago.  There is no family history of dementia.  He denies any headache, seizures, head injury with loss of consciousness.  He has not yet had any work-up for reversible causes of memory loss.  He has a small left tentorial meningioma and is now due for surveillance repeat MRI Updated 05/26/2022 : He returns for follow-up after last visit 3 months ago.  He states he is doing well and his short-term memory difficulties are unchanged..  I ordered MRI scan of the brain at last visit for unclear reasons that has not been done.  EEG done on 02/28/2021 was normal.  Lab work on 02/14/2022 showed normal vitamin B12, TSH, RPR and homocystine.  Headache With left foot infected and he is currently on antibiotics for that.  Recurrent stroke or TIA symptoms.  He is tolerating Plavix well without bruising or bleeding.  Blood pressure is normally under good control though today it is elevated at 161/77.  States his sugars and good control.No new complaints today Update 08/30/2022 : .   He returns for follow-up after last visit 3 months ago.  He states even has had a rough last few months results of pneumonia and being diagnosed with multivessel coronary artery disease requiring cardiac surgery soon.  He was admitted in February with fever and chills and feeling weak and diagnosed with pneumonia.  He was also having chest pain on the right side and some shortness of breath.  Troponins were elevated and cardiology was consulted.  MRI scan showed small acute to subacute infarcts in the left basal ganglia, right frontal and left occipital periventricular white matter likely lacunar infarcts from small vessel disease.  He has no new focal motor deficits related to these.  There is unchanged appearance of the small left tentorial  meningioma.  MR angiogram of the brain showed no significant large vessel stenosis.  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  LDL cholesterol was 140 mg percent and hemoglobin A1c was 6.3.  CT scan of the chest showed right upper lobe opacity consistent with pneumonia.  CT angiogram of the coronaries showed severe flow-limiting coronary artery disease in the left anterior descending, left circumflex and right coronary arteries.  Patient has been started on aspirin and Plavix and is scheduled to undergo cardiac bypass surgery soon.  He states his memory difficulties are unchanged.  On the MoCA testing today scored 22/36 is unchanged from last visit.  He still feels weak and has low stamina.  Still feels he is getting over his pneumonia.  Still has a weak cough and does have intermittent coughing. Update 03/27/2023 : He returns for follow-up after last visit with me 6 and half months ago.  He states he is doing well and has not had any recurrent TIA or stroke symptoms.  He remains on aspirin and Plavix which is tolerating well without bruising or bleeding.  His sugars are all under good control and  metformin and last A1c was 5.5 on 12/06/2022.  He is on Repatha injections and Lipitor and states he is tolerating them well without side effects.  States last lipid profile was quite satisfactory when checked by primary physician but I do not have the results to review today.  He has cataracts and plans to undergo surgery in January next year.  He recently twisted his hip and has some pain and difficulty walking from that.  Is also had some leg swelling for which his cardiologist has prescribed some Lasix.   ROS:   14 system review of systems is positive for left ankle pain, memory loss, difficulty remembering names gait imbalance, falls, hip pain, difficulty walking, leg swelling and all other systems negative  PMH:  Past Medical History:  Diagnosis Date   Allergy    seasonal    Anginal pain (HCC)    Anxiety    CHF (congestive heart failure) (HCC)    Coronary artery disease    Depression    Diabetes mellitus without complication (HCC)    Type 2   GERD (gastroesophageal reflux disease)    Pt states "take ambien for this"   Hepatitis C    HLD (hyperlipidemia) 08/09/2017   Hypertension    Hypoglycemia    Pneumonia 06/2022   Stroke (HCC) 03/2021   TIA    Social History:  Social History   Socioeconomic History   Marital status: Single    Spouse name: Not on file   Number of children: 0   Years of education: some college   Highest education level: Associate degree: occupational, Scientist, product/process development, or vocational program  Occupational History   Occupation: retired  Tobacco Use   Smoking status: Former   Smokeless tobacco: Never   Tobacco comments:    quit 28yrs ago, smoke a cigar occassionally.  Vaping Use   Vaping status: Never Used  Substance and Sexual Activity   Alcohol use: Not Currently    Comment: Quit 30 years ago   Drug use: No   Sexual activity: Yes  Other Topics Concern   Not on file  Social History Narrative   Lives alone.   Right-handed.   Two cups caffeine per day.   Social Determinants of Health   Financial Resource Strain: Low Risk  (12/06/2022)   Overall Financial Resource Strain (CARDIA)    Difficulty of Paying Living Expenses: Not very hard  Food Insecurity: Food Insecurity Present (12/06/2022)   Hunger Vital Sign    Worried About Running Out of Food in the Last Year: Sometimes true    Ran Out of Food in the Last Year: Never true  Transportation Needs: No Transportation Needs (12/06/2022)   PRAPARE - Administrator, Civil Service (Medical): No    Lack of Transportation (Non-Medical): No  Physical Activity: Sufficiently Active (12/06/2022)   Exercise Vital Sign    Days of Exercise per Week: 7 days    Minutes of Exercise per Session: 40 min  Recent Concern: Physical Activity - Insufficiently Active (09/29/2022)    Exercise Vital Sign    Days of Exercise per Week: 1 day    Minutes of Exercise per Session: 10 min  Stress: Stress Concern Present (12/06/2022)   [employer] of Occupational Health - Occupational Stress Questionnaire    Feeling of Stress : To some extent  Social Connections: Unknown (12/21/2022)   Received from [facility]   Social Network    Social Network: Not on file  Recent Concern: Social Connections -  Socially Isolated (12/06/2022)   Social Connection and Isolation Panel [NHANES]    Frequency of Communication with Friends and Family: Twice a week    Frequency of Social Gatherings with Friends and Family: Once a week    Attends Religious Services: Never    Database administrator or Organizations: No    Attends Banker Meetings: Never    Marital Status: Divorced  Catering manager Violence: Unknown (12/21/2022)   Received from [facility]   HITS    Physically Hurt: Not on file    Insult or Talk Down To: Not on file    Threaten Physical Harm: Not on file    Scream or Curse: Not on file    Medications:   Current Outpatient Medications on File Prior to Visit  Medication Sig Dispense Refill   aspirin EC 81 MG tablet Take 1 tablet (81 mg total) by mouth daily. Swallow whole. 30 tablet 12   atorvastatin (LIPITOR) 80 MG tablet Take 1 tablet (80 mg total) by mouth daily. 30 tablet 2   cholecalciferol (VITAMIN D3) 25 MCG (1000 UNIT) tablet Take 1 tablet (1,000 Units total) by mouth daily. Take 1,000 Units by mouth daily. 90 tablet 2   clopidogrel (PLAVIX) 75 MG tablet Take 1 tablet (75 mg total) by mouth daily. 90 tablet 3   Evolocumab (REPATHA SURECLICK) 140 MG/ML SOAJ Inject 140 mg into the skin every 14 days. 6 mL 3   fenofibrate 160 MG tablet Take 1 tablet (160 mg total) by mouth daily. 90 tablet 1   fluticasone (FLONASE) 50 MCG/ACT nasal spray Place 2 sprays into both nostrils daily. 48 g 1   furosemide (LASIX) 20 MG tablet Take 1 tablet (20 mg total) by mouth  daily. 90 tablet 3   gabapentin (NEURONTIN) 300 MG capsule Take 1 capsule (300 mg total) by mouth at bedtime. 90 capsule 3   gatifloxacin (ZYMAXID) 0.5 % SOLN Place 1 drop into the left eye 4 (four) times daily as directed Store upside down!!! 5 mL 1   hydrALAZINE (APRESOLINE) 25 MG tablet TAKE 1 TABLET(25 MG) BY MOUTH EVERY 8 HOURS 90 tablet 2   ketorolac (ACULAR) 0.5 % ophthalmic solution Place 1 drop into the left eye 4 (four) times daily as directed. 10 mL 1   losartan (COZAAR) 100 MG tablet Take 1 tablet (100 mg total) by mouth daily. 180 tablet 1   magnesium oxide (MAG-OX) 400 (240 Mg) MG tablet Take 400 mg by mouth daily.     Melatonin 10 MG TABS Take 10 mg by mouth at bedtime.     metFORMIN (GLUCOPHAGE-XR) 500 MG 24 hr tablet Take 1 tablet (500 mg) by mouth daily with breakfast. 180 tablet 1   metoprolol tartrate (LOPRESSOR) 25 MG tablet Take 1 tablet (25 mg total) by mouth 2 (two) times daily. 180 tablet 3   PARoxetine (PAXIL) 40 MG tablet Take 1 tablet (40 mg total) by mouth every morning. 90 tablet 1   prednisoLONE acetate (PRED FORTE) 1 % ophthalmic suspension Place 1 drop into the left eye 4 (four) times daily as directed Please shake well before each use!! 5 mL 1   vitamin E 180 MG (400 UNITS) capsule Take 400 Units by mouth daily.     nitroGLYCERIN (NITROSTAT) 0.4 MG SL tablet Place 1 tablet under the tongue every 5 minutes as needed for chest pain. 25 tablet 3   No current facility-administered medications on file prior to visit.    Allergies:  No Known Allergies  Physical Exam General: well developed, well nourished, pleasant elderly Caucasian male seated, in no evident distress Head: head normocephalic and atraumatic.  Neck: supple with no carotid or supraclavicular bruits Cardiovascular: regular rate and rhythm, no murmurs Musculoskeletal: no deformity Skin:  no rash/petichiae Vascular:  Normal pulses all extremities Vitals:   03/27/23 1502  BP: 123/76  Pulse: 92    Neurologic Exam Mental Status: Awake and fully alert. Oriented to place and time. Recent and remote memory intact. Attention span, concentration and fund of knowledge appropriate. Mood and affect appropriate.  Diminished recall 0/3.  Able to name 10 animals which can walk on 4 legs.  Clock drawing 4/4.  MMSE not done but was 22/30 at last visit on 08/30/2022 Cranial Nerves: Fundoscopic exam not done pupils equal, briskly reactive to light. Extraocular movements full without nystagmus. Visual fields full to confrontation. Hearing intact. Facial sensation intact. Face, tongue, palate moves normally and symmetrically.  Motor: Normal bulk and tone. Normal strength in all tested extremity muscles. Sensory.: intact to touch ,pinprick .position and vibratory sensation.  Coordination: Rapid alternating movements normal in all extremities. Finger-to-nose and heel-to-shin performed accurately bilaterally. Gait and Station: Arises from chair without difficulty. Stance is normal. Gait demonstrates normal stride length and balance . Able to heel, toe and tandem walk with moderate difficulty.  Reflexes: 1+ and symmetric. Toes downgoing.     08/30/2022    2:11 PM 02/14/2022    8:51 AM  Montreal Cognitive Assessment   Visuospatial/ Executive (0/5) 4 3  Naming (0/3) 3 3  Attention: Read list of digits (0/2) 1 2  Attention: Read list of letters (0/1) 1 1  Attention: Serial 7 subtraction starting at 100 (0/3) 3 3  Language: Repeat phrase (0/2) 2 1  Language : Fluency (0/1) 0 1  Abstraction (0/2) 2 2  Delayed Recall (0/5) 0 0  Orientation (0/6) 6 6  Total 22 22      ASSESSMENT: 77 year old Caucasian male with bicerebral tiny subcortical silent lacunar stroke from small vessel disease.  Vascular risk factors of hypertension and hyperlipidemia.  New complaints of memory loss due to mild cognitive impairment.  History of small left tentorial meningioma and small 1 to 2 mm ACOM aneurysm which appear  stable.     PLAN:  I had a long discussion with the patient regarding his recent lacunar strokes and mild cognitive impairment and discussed about stroke prevention and treatment and answered questions.  We also discussed his mild cognitive impairment which appears stable .  Continue aspirin and Plavix given his significant coronary artery disease and aggressive risk factor modification with strict control of hypertension with blood pressure goal below 140/90, lipids with LDL cholesterol goal below 70 mg percent and see goal below 6.5%..  I encouraged him to increase participation in cognitively challenging activities like solving crossword puzzles, doing word searches, playing bridge and other cognitively challenging activities.  We also discussed memory compensation strategies.  He will return for follow-up in the future in 1 year or call earlier if necessary.. Greater than 50% of time during this  35 minute visit was spent on counseling,explanation of diagnosis of lacunar stroke, memory loss and mild cognitive impairment as well as lacunar strokes, planning of further management, discussion with patient and family and coordination of care Delia Heady, MD Note: This document was prepared with digital dictation and possible smart phrase technology. Any transcriptional errors that result from this process are unintentional

## 2023-03-30 ENCOUNTER — Other Ambulatory Visit: Payer: Self-pay

## 2023-03-30 ENCOUNTER — Other Ambulatory Visit (HOSPITAL_COMMUNITY): Payer: Self-pay

## 2023-03-30 ENCOUNTER — Other Ambulatory Visit: Payer: Self-pay | Admitting: Internal Medicine

## 2023-03-30 DIAGNOSIS — I25118 Atherosclerotic heart disease of native coronary artery with other forms of angina pectoris: Secondary | ICD-10-CM

## 2023-03-30 MED ORDER — ATORVASTATIN CALCIUM 80 MG PO TABS
80.0000 mg | ORAL_TABLET | Freq: Every day | ORAL | 3 refills | Status: DC
  Filled 2023-03-30: qty 90, 90d supply, fill #0
  Filled 2023-06-09: qty 90, 90d supply, fill #1

## 2023-04-05 ENCOUNTER — Other Ambulatory Visit (HOSPITAL_COMMUNITY): Payer: Self-pay

## 2023-04-06 ENCOUNTER — Other Ambulatory Visit: Payer: Self-pay | Admitting: Internal Medicine

## 2023-04-06 ENCOUNTER — Other Ambulatory Visit (HOSPITAL_COMMUNITY): Payer: Self-pay

## 2023-04-14 ENCOUNTER — Other Ambulatory Visit (HOSPITAL_COMMUNITY): Payer: Self-pay

## 2023-04-14 ENCOUNTER — Encounter: Payer: Self-pay | Admitting: Internal Medicine

## 2023-04-14 ENCOUNTER — Other Ambulatory Visit: Payer: Self-pay

## 2023-04-14 MED FILL — Hydralazine HCl Tab 25 MG: ORAL | 30 days supply | Qty: 90 | Fill #0 | Status: AC

## 2023-05-12 ENCOUNTER — Other Ambulatory Visit (HOSPITAL_COMMUNITY): Payer: Self-pay

## 2023-05-18 ENCOUNTER — Telehealth: Payer: Self-pay

## 2023-05-18 ENCOUNTER — Other Ambulatory Visit (HOSPITAL_COMMUNITY): Payer: Self-pay

## 2023-05-18 ENCOUNTER — Other Ambulatory Visit: Payer: Self-pay

## 2023-05-18 ENCOUNTER — Ambulatory Visit: Payer: 59 | Admitting: Gastroenterology

## 2023-05-18 VITALS — BP 114/58 | HR 69 | Ht 72.0 in | Wt 183.1 lb

## 2023-05-18 DIAGNOSIS — Z09 Encounter for follow-up examination after completed treatment for conditions other than malignant neoplasm: Secondary | ICD-10-CM

## 2023-05-18 DIAGNOSIS — Z860101 Personal history of adenomatous and serrated colon polyps: Secondary | ICD-10-CM

## 2023-05-18 DIAGNOSIS — Z951 Presence of aortocoronary bypass graft: Secondary | ICD-10-CM

## 2023-05-18 DIAGNOSIS — Z7902 Long term (current) use of antithrombotics/antiplatelets: Secondary | ICD-10-CM

## 2023-05-18 DIAGNOSIS — Z860100 Personal history of colon polyps, unspecified: Secondary | ICD-10-CM

## 2023-05-18 MED ORDER — NA SULFATE-K SULFATE-MG SULF 17.5-3.13-1.6 GM/177ML PO SOLN
1.0000 | Freq: Once | ORAL | 0 refills | Status: AC
  Filled 2023-05-18: qty 354, 1d supply, fill #0

## 2023-05-18 MED FILL — Hydralazine HCl Tab 25 MG: ORAL | 30 days supply | Qty: 90 | Fill #1 | Status: AC

## 2023-05-18 NOTE — Patient Instructions (Signed)
 You have been scheduled for a colonoscopy. Please follow written instructions given to you at your visit today.   Please pick up your prep supplies at the pharmacy within the next 1-3 days.  If you use inhalers (even only as needed), please bring them with you on the day of your procedure.  DO NOT TAKE 7 DAYS PRIOR TO TEST- Trulicity (dulaglutide) Ozempic, Wegovy (semaglutide) Mounjaro (tirzepatide) Bydureon Bcise (exanatide extended release)  DO NOT TAKE 1 DAY PRIOR TO YOUR TEST Rybelsus (semaglutide) Adlyxin (lixisenatide) Victoza (liraglutide) Byetta (exanatide) ___________________________________________________________________________   _______________________________________________________  If your blood pressure at your visit was 140/90 or greater, please contact your primary care physician to follow up on this.  _______________________________________________________  If you are age 43 or older, your body mass index should be between 23-30. Your Body mass index is 24.84 kg/m. If this is out of the aforementioned range listed, please consider follow up with your Primary Care Provider.  If you are age 22 or younger, your body mass index should be between 19-25. Your Body mass index is 24.84 kg/m. If this is out of the aformentioned range listed, please consider follow up with your Primary Care Provider.   ________________________________________________________  The [facility] providers would like to encourage you to use MYCHART to communicate with providers for non-urgent requests or questions.  Due to long hold times on the telephone, sending your provider a message by [facility] may be a faster and more efficient way to get a response.  Please allow 48 business hours for a response.  Please remember that this is for non-urgent requests.  _______________________________________________________   It was a pleasure to see you today!  Thank you for trusting me with your  gastrointestinal care!    Scott E.Stacia, MD

## 2023-05-18 NOTE — Telephone Encounter (Signed)
 Dr. Barbaraann, pt was advised by you to take Plavix  for one year post MI when you saw him on 03/02/23. He is now pending colonscopy with request to hold Plavix  for 5 days. Do you have any objections to this request? There is no request to hold aspirin .  Please direct your response to p cv div preop.  Thank you, Rosaline

## 2023-05-18 NOTE — Progress Notes (Signed)
 HPI : Collin Bridges is a 78 y.o. male with coronary artery disease status post 5 vessel CABG June 2024, depression, diabetes, history of occult stroke/TIA who presents for consideration of repeat colonoscopy.  The patient had undergone an initial screening colonoscopy in October 2023 which revealed a large flat polyp in the ascending colon removed piecemeal.  The large polyp was a tubular adenoma without high-grade dysplasia.  He had 5 other smaller tubular adenomas that were removed.  He was recommended to repeat colonoscopy in 6-9 months to reassess the large polypectomy site.   He was hospitalized in February 2024 with severe sepsis secondary to community-acquired pneumonia, NSTEMI. Echocardiogram showed EF 60 to 65%, hypokinesis of the basal inferior segment.  Coronary CTA with multivessel CAD prompting cardiac catheterization which revealed multivessel CAD involving the LAD/D1, LCx, OM1, with extensive disease in small codominant RCA => not favorable from a percutaneous approach.  CT surgery consultation was deferred to the outpatient setting.  MRI/A of the head during admission showed scattered bilateral subcortical strokes thought to be in the setting of small vessel disease, stable 1 x 2 mm anterior communicating artery aneurysm (he has a history of prior CVA, follows with neurology).     He underwent the bypass in June 2024 and had slow but stable recovery.  He spent a few weeks in a rehab facility, but is now back home, where he lives by himself.  He was admitted in July for observation with symptoms of hemoptysis.  No clear etiology found it was felt unlikely to be pneumonia.    He was last seen by me in August 2024 to consider repeat colonoscopy.  At that time, he was still recovering from his surgery, and was still quite weak.  It was recommended that he come back in a few months to reassess candidacy for another colonoscopy. He was last seen by his cardiologist in October of last  year.  At that time, Plavix  was restarted and his aspirin  dose was reduced to 81 mg.  He was started on Lasix  for lower extremity swelling thought to be secondary to diastolic heart dysfunction.  Today, the patient reports he is doing well.  He feels like he is back to his baseline level of health following his bypass surgery.  He denies any symptoms such as chest pain/pressure or shortness of breath.  He is active at home.  He lives independently.  He can go up and down [place] without having to stop.  He denies any bothersome GI symptoms.  Specifically, no problems with constipation, diarrhea or blood in the stool.  He would like to schedule his follow-up colonoscopy.    Colonoscopy Oct 2023 (Dr. Stacia) Initial average risk screening Impression - One 25 mm polyp in the ascending colon, removed piecemeal with cold snare polypectomy. Resected and retrieved. Clips (MR conditional) were placed. Tattooed.  - Four 4 to 8 mm polyps in the ascending colon, removed with a cold snare. Resected and retrieved.  - One 5 mm polyp in the sigmoid colon, removed with a cold snare. Resected and retrieved.  - Diverticulosis in the sigmoid colon, in the descending colon and in the ascending colon.   1. Surgical [P], colon, ascending, sigmoid, polyp (5) - TUBULAR ADENOMAS (5), NEGATIVE FOR HIGH-GRADE DYSPLASIA. 2. Surgical [P], colon, ascending, polyp (1) - TUBULAR ADENOMA, MULTIPLE ADENOMATOUS FRAGMENTS, NEGATIVE FOR HIGH-GRADE DYSPLASIA.  Past Medical History:  Diagnosis Date   Allergy    seasonal   Anginal pain (HCC)  Anxiety    CHF (congestive heart failure) (HCC)    Coronary artery disease    Depression    Diabetes mellitus without complication (HCC)    Type 2   GERD (gastroesophageal reflux disease)    Pt states take ambien  for this   Hepatitis C    HLD (hyperlipidemia) 08/09/2017   Hypertension    Hypoglycemia    Pneumonia 06/2022   Stroke (HCC) 03/2021   TIA     Past  Surgical History:  Procedure Laterality Date   APPENDECTOMY     BUBBLE STUDY  06/16/2021   Procedure: BUBBLE STUDY;  Surgeon: Barbaraann Darryle Ned, MD;  Location: [facility] ENDOSCOPY;  Service: Cardiovascular;;   COLONOSCOPY     CORONARY ARTERY BYPASS GRAFT N/A 11/10/2022   Procedure: CORONARY ARTERY BYPASS GRAFTING (CABG) X5 USING LEFT INTERNAL MAMMARY ARTERY (LIMA) AND ENDOSCOPICALLY HARVESTED RIGHT AND LEFT GREATER SAPHENOUS VEIN;  Surgeon: Maryjane Mt, MD;  Location: [facility] OR;  Service: Open Heart Surgery;  Laterality: N/A;   FRACTURE SURGERY Left    Ankle   LEFT HEART CATH AND CORONARY ANGIOGRAPHY N/A 07/12/2022   Procedure: LEFT HEART CATH AND CORONARY ANGIOGRAPHY;  Surgeon: Anner Alm ORN, MD;  Location: [facility] INVASIVE CV LAB;  Service: Cardiovascular;  Laterality: N/A;   TEE WITHOUT CARDIOVERSION N/A 06/16/2021   Procedure: TRANSESOPHAGEAL ECHOCARDIOGRAM (TEE);  Surgeon: Barbaraann Darryle Ned, MD;  Location: [facility] ENDOSCOPY;  Service: Cardiovascular;  Laterality: N/A;   TEE WITHOUT CARDIOVERSION N/A 11/10/2022   Procedure: TRANSESOPHAGEAL ECHOCARDIOGRAM;  Surgeon: Maryjane Mt, MD;  Location: [facility] OR;  Service: Open Heart Surgery;  Laterality: N/A;   TONSILECTOMY, ADENOIDECTOMY, BILATERAL MYRINGOTOMY AND TUBES  1955   Family History  Problem Relation Age of Onset   Lung disease Mother    Hyperlipidemia Father    Hypertension Father    Social History   Tobacco Use   Smoking status: Former   Smokeless tobacco: Never   Tobacco comments:    quit 71yrs ago, smoke a cigar occassionally.  Vaping Use   Vaping status: Never Used  Substance Use Topics   Alcohol  use: Not Currently    Comment: Quit 30 years ago   Drug use: No   Current Outpatient Medications  Medication Sig Dispense Refill   atorvastatin  (LIPITOR ) 80 MG tablet Take 1 tablet (80 mg total) by mouth daily. 90 tablet 3   aspirin  EC 81 MG tablet Take 1 tablet (81 mg total) by mouth daily. Swallow whole. 30 tablet 12    cholecalciferol (VITAMIN D3) 25 MCG (1000 UNIT) tablet Take 1 tablet (1,000 Units total) by mouth daily. Take 1,000 Units by mouth daily. 90 tablet 2   clopidogrel  (PLAVIX ) 75 MG tablet Take 1 tablet (75 mg total) by mouth daily. 90 tablet 3   Evolocumab  (REPATHA  SURECLICK) 140 MG/ML SOAJ Inject 140 mg into the skin every 14 days. 6 mL 3   fenofibrate  160 MG tablet Take 1 tablet (160 mg total) by mouth daily. 90 tablet 1   fluticasone  (FLONASE ) 50 MCG/ACT nasal spray Place 2 sprays into both nostrils daily. 48 g 1   furosemide  (LASIX ) 20 MG tablet Take 1 tablet (20 mg total) by mouth daily. 90 tablet 3   gabapentin  (NEURONTIN ) 300 MG capsule Take 1 capsule (300 mg total) by mouth at bedtime. 90 capsule 3   gatifloxacin  (ZYMAXID ) 0.5 % SOLN Place 1 drop into the left eye 4 (four) times daily as directed Store upside down!!! 5 mL 1   hydrALAZINE  (APRESOLINE ) 25  MG tablet Take 1 tablet (25 mg total) by mouth every 8 (eight) hours. 90 tablet 2   ketorolac  (ACULAR ) 0.5 % ophthalmic solution Place 1 drop into the left eye 4 (four) times daily as directed. 10 mL 1   losartan  (COZAAR ) 100 MG tablet Take 1 tablet (100 mg total) by mouth daily. 180 tablet 1   magnesium  oxide (MAG-OX) 400 (240 Mg) MG tablet Take 400 mg by mouth daily.     Melatonin 10 MG TABS Take 10 mg by mouth at bedtime.     metFORMIN  (GLUCOPHAGE -XR) 500 MG 24 hr tablet Take 1 tablet (500 mg) by mouth daily with breakfast. 180 tablet 1   metoprolol  tartrate (LOPRESSOR ) 25 MG tablet Take 1 tablet (25 mg total) by mouth 2 (two) times daily. 180 tablet 3   nitroGLYCERIN  (NITROSTAT ) 0.4 MG SL tablet Place 1 tablet under the tongue every 5 minutes as needed for chest pain. 25 tablet 3   PARoxetine  (PAXIL ) 40 MG tablet Take 1 tablet (40 mg total) by mouth every morning. 90 tablet 1   prednisoLONE  acetate (PRED FORTE ) 1 % ophthalmic suspension Place 1 drop into the left eye 4 (four) times daily as directed Please shake well before each use!! 5 mL  1   vitamin E 180 MG (400 UNITS) capsule Take 400 Units by mouth daily.     zolpidem  (AMBIEN ) 10 MG tablet TAKE 1 TABLET(10 MG) BY MOUTH AT BEDTIME AS NEEDED FOR SLEEP 90 tablet 1   No current facility-administered medications for this visit.   No Known Allergies   Review of Systems: All systems reviewed and negative except where noted in HPI.    No results found.  Physical Exam: BP (!) 114/58   Pulse 69   Ht 6' (1.829 m)   Wt 183 lb 2 oz (83.1 kg)   SpO2 96%   BMI 24.84 kg/m  Constitutional: Pleasant,well-developed, Caucasian male in no acute distress. HEENT: Normocephalic and atraumatic. Conjunctivae are normal. No scleral icterus. Neck supple.  Cardiovascular: Normal rate, regular rhythm.  Systolic murmur best heard at left upper sternal border.  Well-healed sternotomy scars Pulmonary/chest: Effort normal and breath sounds normal. No wheezing, rales or rhonchi. Abdominal: Soft, nondistended, nontender. Bowel sounds active throughout. There are no masses palpable. No hepatomegaly. Extremities: no edema Neurological: Alert and oriented to person place and time. Skin: Skin is warm and dry. No rashes noted. Psychiatric: Normal mood and affect. Behavior is normal.  CBC    Component Value Date/Time   WBC 7.3 12/06/2022 1122   RBC 4.25 12/06/2022 1122   HGB 12.7 (L) 12/06/2022 1122   HGB 13.6 05/24/2021 0844   HCT 38.6 (L) 12/06/2022 1122   HCT 40.3 05/24/2021 0844   PLT 229.0 12/06/2022 1122   PLT 111 (L) 05/24/2021 0844   MCV 90.7 12/06/2022 1122   MCV 95 05/24/2021 0844   MCH 30.3 11/25/2022 0309   MCHC 32.8 12/06/2022 1122   RDW 16.5 (H) 12/06/2022 1122   RDW 12.0 05/24/2021 0844   LYMPHSABS 1.3 12/06/2022 1122   MONOABS 0.9 12/06/2022 1122   EOSABS 0.8 (H) 12/06/2022 1122   BASOSABS 0.1 12/06/2022 1122    CMP     Component Value Date/Time   NA 144 03/02/2023 1012   K 5.0 03/02/2023 1012   CL 106 03/02/2023 1012   CO2 26 03/02/2023 1012   GLUCOSE 100  (H) 03/02/2023 1012   GLUCOSE 100 (H) 12/06/2022 1122   BUN 15 03/02/2023 1012  CREATININE 1.31 (H) 03/02/2023 1012   CALCIUM  9.4 03/02/2023 1012   PROT 7.1 12/06/2022 1122   PROT 7.0 08/26/2022 1106   ALBUMIN  4.2 12/06/2022 1122   ALBUMIN  4.7 08/26/2022 1106   AST 22 12/06/2022 1122   ALT 16 12/06/2022 1122   ALKPHOS 123 (H) 12/06/2022 1122   BILITOT 0.9 12/06/2022 1122   BILITOT 0.7 08/26/2022 1106   GFRNONAA 57 (L) 11/25/2022 0309       Latest Ref Rng & Units 12/06/2022   11:22 AM 11/25/2022    3:09 AM 11/24/2022    4:45 AM  CBC EXTENDED  WBC 4.0 - 10.5 K/uL 7.3  8.0  8.6   RBC 4.22 - 5.81 Mil/uL 4.25  3.66  3.66   Hemoglobin 13.0 - 17.0 g/dL 87.2  88.8  88.9   HCT 39.0 - 52.0 % 38.6  33.0  33.2   Platelets 150.0 - 400.0 K/uL 229.0  284  289   NEUT# 1.4 - 7.7 K/uL 4.2  5.2    Lymph# 0.7 - 4.0 K/uL 1.3  1.5        ASSESSMENT AND PLAN:  78 year old male with coronary artery disease status post CABG in June 2024, overdue for surveillance colonoscopy.  He had a 25 mm tubular adenoma removed in the ascending colon and October 2023.  Surveillance colonoscopy was delayed because of his heart surgery.  It appears he is fully recovered from his heart surgery and has no concerning or persistent symptoms.  He recently saw his cardiologist who had no additional concerns and restarted him on Plavix . I think the patient is medically fit to undergo an elective surveillance colonoscopy.  If we do not find any residual polyp on his large polypectomy site, this should be his last colonoscopy. We will schedule him for a surveillance colonoscopy.  We will plan to hold his Plavix  for 5 days prior to his procedure.  We will send a letter to his cardiologist to ensure that there are no objections from their standpoint about holding his Plavix .  History of large colon polyp - Schedule surveillance colonoscopy - Hold Plavix  5 days prior to procedure  Coronary artery disease status post CABG -  Patient appears to have recovered fully, no lingering symptoms  The details, risks (including bleeding, perforation, infection, missed lesions, medication reactions and possible hospitalization or surgery if complications occur), benefits, and alternatives to colonoscopy with possible biopsy and possible polypectomy were discussed with the patient and he consents to proceed.    Raedyn Wenke E. Stacia, MD [facility]  Norleen Lynwood ORN, MD

## 2023-05-18 NOTE — Telephone Encounter (Signed)
   Primary Cardiologist: Darryle ONEIDA Decent, MD  Chart reviewed as part of pre-operative protocol coverage. Given past medical history and time since last visit, based on ACC/AHA guidelines, Collin Bridges would be at acceptable risk for the planned procedure without further cardiovascular testing.   Patient was advised that if he develops new symptoms prior to surgery to contact our office to arrange a follow-up appointment.  He verbalized understanding.  Per Dr. Decent, primary cardiologist, he may hold Plavix  for 5 days prior to colonoscopy and should resume as soon as hemodynamically stable following the procedure. Ideally aspirin  should be continued without interruption, however if the bleeding risk is too great, aspirin  may be held for 5-7 days prior to surgery. Please resume aspirin  post operatively when it is felt to be safe from a bleeding standpoint.   I will route this recommendation to the requesting party via Epic fax function and remove from pre-op pool.  Please call with questions.  Rosaline EMERSON Bane, NP-C 05/18/2023, 3:17 PM [address] Office [phone] Fax [phone]

## 2023-05-18 NOTE — Telephone Encounter (Signed)
 [facility] Pre-operative Risk Assessment     Request for surgical clearance:     Endoscopy Procedure  What type of surgery is being performed?     Colonoscopy  When is this surgery scheduled?     05/30/23  What type of clearance is required ?   Pharmacy  Are there any medications that need to be held prior to surgery and how long? Plavix  5 days   Practice name and name of physician performing surgery?      [facility]  What is your office phone and fax number?      Phone- [phone]  Fax- [phone]  Anesthesia type (None, local, MAC, general) ?       MAC   Please route your response to [facility]

## 2023-05-22 ENCOUNTER — Other Ambulatory Visit (HOSPITAL_COMMUNITY): Payer: Self-pay

## 2023-05-23 ENCOUNTER — Telehealth: Payer: Self-pay | Admitting: Gastroenterology

## 2023-05-23 ENCOUNTER — Encounter: Payer: Self-pay | Admitting: Gastroenterology

## 2023-05-23 ENCOUNTER — Other Ambulatory Visit: Payer: Self-pay

## 2023-05-23 ENCOUNTER — Other Ambulatory Visit (HOSPITAL_COMMUNITY): Payer: Self-pay

## 2023-05-23 MED ORDER — GATIFLOXACIN 0.5 % OP SOLN
1.0000 [drp] | Freq: Four times a day (QID) | OPHTHALMIC | 1 refills | Status: DC
  Filled 2023-05-23: qty 2.5, 13d supply, fill #0

## 2023-05-23 MED ORDER — KETOROLAC TROMETHAMINE 0.5 % OP SOLN
1.0000 [drp] | Freq: Four times a day (QID) | OPHTHALMIC | 1 refills | Status: DC
  Filled 2023-05-23: qty 10, 50d supply, fill #0

## 2023-05-23 MED ORDER — PREDNISOLONE ACETATE 1 % OP SUSP
1.0000 [drp] | Freq: Four times a day (QID) | OPHTHALMIC | 1 refills | Status: DC
  Filled 2023-05-23: qty 5, 25d supply, fill #0

## 2023-05-23 NOTE — Telephone Encounter (Signed)
 Inbound call from patient requesting to reschedule colonoscopy to 3/6. Requesting for updated prep instructions to be mailed. Please advise, thank you.

## 2023-05-29 ENCOUNTER — Other Ambulatory Visit (HOSPITAL_COMMUNITY): Payer: Self-pay

## 2023-05-29 ENCOUNTER — Other Ambulatory Visit: Payer: Self-pay

## 2023-05-29 ENCOUNTER — Other Ambulatory Visit: Payer: Self-pay | Admitting: Internal Medicine

## 2023-05-29 MED ORDER — FENOFIBRATE 160 MG PO TABS
160.0000 mg | ORAL_TABLET | Freq: Every day | ORAL | 1 refills | Status: DC
  Filled 2023-05-29: qty 90, 90d supply, fill #0

## 2023-05-29 MED ORDER — MELATONIN 10 MG PO TABS
10.0000 mg | ORAL_TABLET | Freq: Every day | ORAL | 0 refills | Status: DC
  Filled 2023-05-29: qty 30, 20d supply, fill #0

## 2023-05-29 MED ORDER — PAROXETINE HCL 40 MG PO TABS
40.0000 mg | ORAL_TABLET | ORAL | 1 refills | Status: DC
  Filled 2023-05-29: qty 90, 90d supply, fill #0

## 2023-05-30 ENCOUNTER — Encounter: Payer: 59 | Admitting: Gastroenterology

## 2023-05-30 ENCOUNTER — Other Ambulatory Visit: Payer: Self-pay

## 2023-05-30 NOTE — Progress Notes (Signed)
Cardiology Clinic Note   Patient Name: Collin Bridges [facility] Date of Encounter: 06/02/2023  Primary Care Provider:  Corwin Levins, MD Primary Cardiologist:  Reatha Harps, MD  Patient Profile    Collin Bridges 11 male presents to the clinic today for follow-up evaluation of his essential hypertension, coronary artery disease, and mild aortic stenosis.  Past Medical History    Past Medical History:  Diagnosis Date   Allergy    seasonal   Anginal pain (HCC)    Anxiety    CHF (congestive heart failure) (HCC)    Coronary artery disease    Depression    Diabetes mellitus without complication (HCC)    Type 2   GERD (gastroesophageal reflux disease)    Pt states "take ambien for this"   Hepatitis C    HLD (hyperlipidemia) 08/09/2017   Hypertension    Hypoglycemia    Pneumonia 06/2022   Stroke (HCC) 03/2021   TIA   Past Surgical History:  Procedure Laterality Date   APPENDECTOMY     BUBBLE STUDY  06/16/2021   Procedure: BUBBLE STUDY;  Surgeon: Sande Rives, MD;  Location: [facility] ENDOSCOPY;  Service: Cardiovascular;;   COLONOSCOPY     CORONARY ARTERY BYPASS GRAFT N/A 11/10/2022   Procedure: CORONARY ARTERY BYPASS GRAFTING (CABG) X5 USING LEFT INTERNAL MAMMARY ARTERY (LIMA) AND ENDOSCOPICALLY HARVESTED RIGHT AND LEFT GREATER SAPHENOUS VEIN;  Surgeon: Eugenio Hoes, MD;  Location: [facility] OR;  Service: Open Heart Surgery;  Laterality: N/A;   FRACTURE SURGERY Left    Ankle   LEFT HEART CATH AND CORONARY ANGIOGRAPHY N/A 07/12/2022   Procedure: LEFT HEART CATH AND CORONARY ANGIOGRAPHY;  Surgeon: Marykay Lex, MD;  Location: [facility] INVASIVE CV LAB;  Service: Cardiovascular;  Laterality: N/A;   TEE WITHOUT CARDIOVERSION N/A 06/16/2021   Procedure: TRANSESOPHAGEAL ECHOCARDIOGRAM (TEE);  Surgeon: Sande Rives, MD;  Location: [facility] ENDOSCOPY;  Service: Cardiovascular;  Laterality: N/A;   TEE WITHOUT CARDIOVERSION N/A 11/10/2022   Procedure: TRANSESOPHAGEAL  ECHOCARDIOGRAM;  Surgeon: Eugenio Hoes, MD;  Location: [facility] OR;  Service: Open Heart Surgery;  Laterality: N/A;   TONSILECTOMY, ADENOIDECTOMY, BILATERAL MYRINGOTOMY AND TUBES  1956    Allergies  No Known Allergies  History of Present Illness    Collin Bridges has a PMH of essential hypertension, mild aortic stenosis, CVA, chronic diastolic CHF, NSTEMI, aortic atherosclerosis, pericardial effusion, community-acquired pneumonia, allergic rhinitis, type 2 diabetes, CKD stage IIIa, anxiety, insomnia, and hyperlipidemia.  He was noted to have multivessel coronary artery disease status post NSTEMI 2/24.  He was evaluated by cardiology 1/23.  He was noted to have a possible mitral valve mass that was identified during echocardiogram.  He underwent TEE which showed calcified mitral valve tissue and no further evaluation was recommended.  He was hospitalized 2/24 with severe sepsis secondary to community-acquired pneumonia and NSTEMI.  His echocardiogram showed EF of 60-65%, hypokinesis of basal inferior segment, G1 DD, mild aortic valve stenosis.  His coronary CTA showed multivessel CAD and his FFR was positive with mid LAD, mid-distal left circumflex, proximal RCA stenosis.  He underwent LHC and was noted to have multivessel CAD in his LAD, D1, circumflex, OM1.  His anatomy was not favorable for PCI.  CT surgery was consulted.  He underwent MRI/A of the head during admission which showed scattered bilateral subcortical strokes.  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  He was followed by neurology.  He was seen in the clinic 07/25/2022 and was felt to be stable from a cardiac standpoint.  He reported some intermittent chest pain, DOE.  He was started on Imdur.  He followed up with CT surgery who felt that optimizing medical therapy for CAD would be his best course of treatment.  It was felt that if he failed medical  management Which could be reconsidered.  He was also initiated on Repatha.  He was last seen by Bernadene Person, NP-C on 10/28/2022.  He was accompanied with his girlfriend.  He continues to be stable from a cardiac standpoint.  He did note some increased frequency of intermittent chest pain and dyspnea on exertion.  He reported taking Imdur but was also taking nitroglycerin regularly.  He was looking forward to following up with CT surgery to discuss possibility of surgical intervention.  He underwent CABG x 5 10/21/2022.  He was discharged on 11/21/2022.  He was extubated postop day 1.  His arterial line, chest tubes, and Foley catheter were removed without issue.  He did require postoperative transfusion.  He was noted to have thrombocytopenia and platelet count decreased to 56,000. He received IV diuresis and albumin.  He was noted to have atrial fibrillation with RVR and was started on amiodarone gtt.  He converted to sinus rhythm and was transition to p.o. amiodarone.  He progressed with his physical activity but was noted to have severe deconditioning.  His hemoglobin improved.  He was noted to be hypertensive.  His metoprolol and hydralazine were resumed.  He was transition to p.o. furosemide.  His incisions continue to heal well without signs of infection.  He was felt to be stable for discharge to SNF.  He was admitted from the emergency department on 11/24/2022 and discharged on 11/27/2022.  On arrival he was afebrile and hemodynamically stable.  His chest x-ray showed small left pleural effusion.  CT showed moderate size pericardial effusion with bilateral pleural effusions and cirrhosis of the liver.  He was admitted under the hospitalist service.  He reported 2 days of hemoptysis.  He denied shortness of breath.  His CTA was negative for PE.  He was seen in follow-up by CT surgery and was noted to be stable from a cardiac standpoint.  At discharge she denied chest pain.  His blood pressure was  well-controlled.  He presented to the clinic 12/01/22 for follow-up evaluation and stated he felt well.  He continued to progress through rehab.  He presented with his wife.  We reviewed his CABG.  They expressed understanding.  He had been maintaining sternal precautions.  His blood pressure was 98/58.  He denied further episodes of bleeding.  He denied irregular heartbeats.  I continued his current medical therapy.  Planned follow-up in 4 to 6 months.  I also removed his chest tube sutures .  We reviewed his upcoming visit with his PCP and cardiothoracic surgery on Tuesday.  He was seen in follow-up by Dr. Bufford Buttner on 03/02/2023.  He continued to do well.  He was noted to have some swelling in his feet.  He was wearing lower extremity compression stockings.  He denied chest pain and trouble with his breathing.  He was exercising regularly.  He presents to the clinic today for follow-up evaluation and states he continues to be physically active walking daily and doing activities with his girlfriend.  He also has been doing stairs and denies exertional chest discomfort.  He feels  he has healed well.  He reports that he feels like he is in a new body.  His cholesterol is well-controlled.  His blood pressure today is 124/58.  I will continue his current medication regimen and plan follow-up in 6 months.  Today he denies chest pain, shortness of breath, lower extremity edema, fatigue, palpitations, melena, hematuria, hemoptysis, diaphoresis, weakness, presyncope, syncope, orthopnea, and PND.    Home Medications    Prior to Admission medications   Medication Sig Start Date End Date Taking? Authorizing Provider  amiodarone (PACERONE) 200 MG tablet Take 1 tablet (200 mg total) by mouth 2 (two) times daily. X 7 days, then decrease to 200 mg daily 11/19/22   Barrett, Rae Roam, PA-C  aspirin EC 325 MG tablet Take 1 tablet (325 mg total) by mouth daily. 11/19/22   Barrett, Erin R, PA-C  atorvastatin (LIPITOR) 80 MG  tablet Take 1 tablet (80 mg total) by mouth daily. 07/14/22 11/22/22  Narda Bonds, MD  cholecalciferol (VITAMIN D3) 25 MCG (1000 UNIT) tablet Take 1 tablet (1,000 Units total) by mouth daily. Take 1,000 Units by mouth daily. 11/22/21   Corwin Levins, MD  Evolocumab (REPATHA SURECLICK) 140 MG/ML SOAJ Inject 140 mg into the skin every 14 days. 10/06/22   O'NealRonnald Ramp, MD  fenofibrate 160 MG tablet Take 1 tablet (160 mg total) by mouth daily. 11/09/22   Corwin Levins, MD  fluticasone (FLONASE) 50 MCG/ACT nasal spray Place 2 sprays into both nostrils daily. 11/09/22   Corwin Levins, MD  gabapentin (NEURONTIN) 300 MG capsule Take 1 capsule (300 mg total) by mouth at bedtime. 08/08/22   Plotnikov, Georgina Quint, MD  hydrALAZINE (APRESOLINE) 25 MG tablet Take 1 tablet (25 mg total) by mouth every 8 (eight) hours. 10/11/22 01/09/23  Corwin Levins, MD  losartan (COZAAR) 100 MG tablet Take 1 tablet (100 mg total) by mouth daily. 11/09/22   Corwin Levins, MD  magnesium oxide (MAG-OX) 400 (240 Mg) MG tablet Take 400 mg by mouth daily.    [provider]  metFORMIN (GLUCOPHAGE-XR) 500 MG 24 hr tablet Take 1 tablet (500 mg) by mouth daily with breakfast. 11/09/22   Corwin Levins, MD  metoprolol tartrate (LOPRESSOR) 25 MG tablet Take 1 tablet (25 mg total) by mouth 2 (two) times daily. 11/19/22 03/19/23  Barrett, Erin R, PA-C  nitroGLYCERIN (NITROSTAT) 0.4 MG SL tablet Place 1 tablet under the tongue every 5 minutes as needed for chest pain. 10/06/22 01/04/23  Sande Rives, MD  PARoxetine (PAXIL) 40 MG tablet Take 1 tablet (40 mg total) by mouth every morning. 11/09/22   Corwin Levins, MD  vitamin E 180 MG (400 UNITS) capsule Take 400 Units by mouth daily.    [provider]  zolpidem (AMBIEN) 10 MG tablet TAKE 1 TABLET(10 MG) BY MOUTH AT BEDTIME AS NEEDED FOR SLEEP Patient taking differently: Take 5-10 mg by mouth at bedtime. 09/22/22   Corwin Levins, MD    Family History    Family History   Problem Relation Age of Onset   Lung disease Mother    Hyperlipidemia Father    Hypertension Father    He indicated that his mother is deceased. He indicated that his father is deceased. He indicated that his son is alive.  Social History    Social History   Socioeconomic History   Marital status: Single    Spouse name: Not on file   Number of children: 0  Years of education: some college   Highest education level: Associate degree: occupational, Scientist, product/process development, or vocational program  Occupational History   Occupation: retired  Tobacco Use   Smoking status: Former   Smokeless tobacco: Never   Tobacco comments:    quit 28yrs ago, smoke a cigar occassionally.  Vaping Use   Vaping status: Never Used  Substance and Sexual Activity   Alcohol use: Not Currently    Comment: Quit 30 years ago   Drug use: No   Sexual activity: Yes  Other Topics Concern   Not on file  Social History Narrative   Lives alone.   Right-handed.   Two cups caffeine per day.   Social Drivers of Corporate investment banker Strain: Low Risk  (12/06/2022)   Overall Financial Resource Strain (CARDIA)    Difficulty of Paying Living Expenses: Not very hard  Food Insecurity: Food Insecurity Present (12/06/2022)   Hunger Vital Sign    Worried About Running Out of Food in the Last Year: Sometimes true    Ran Out of Food in the Last Year: Never true  Transportation Needs: No Transportation Needs (12/06/2022)   PRAPARE - Administrator, Civil Service (Medical): No    Lack of Transportation (Non-Medical): No  Physical Activity: Sufficiently Active (12/06/2022)   Exercise Vital Sign    Days of Exercise per Week: 7 days    Minutes of Exercise per Session: 40 min  Recent Concern: Physical Activity - Insufficiently Active (09/29/2022)   Exercise Vital Sign    Days of Exercise per Week: 1 day    Minutes of Exercise per Session: 10 min  Stress: Stress Concern Present (12/06/2022)   [employer] of  Occupational Health - Occupational Stress Questionnaire    Feeling of Stress : To some extent  Social Connections: Unknown (12/21/2022)   Received from [facility]   Social Network    Social Network: Not on file  Recent Concern: Social Connections - Socially Isolated (12/06/2022)   Social Connection and Isolation Panel [NHANES]    Frequency of Communication with Friends and Family: Twice a week    Frequency of Social Gatherings with Friends and Family: Once a week    Attends Religious Services: Never    Database administrator or Organizations: No    Attends Banker Meetings: Never    Marital Status: Divorced  Catering manager Violence: Unknown (12/21/2022)   Received from [facility]   HITS    Physically Hurt: Not on file    Insult or Talk Down To: Not on file    Threaten Physical Harm: Not on file    Scream or Curse: Not on file     Review of Systems    General:  No chills, fever, night sweats or weight changes.  Cardiovascular:  No chest pain, dyspnea on exertion, edema, orthopnea, palpitations, paroxysmal nocturnal dyspnea. Dermatological: No rash, lesions/masses Respiratory: No cough, dyspnea Urologic: No hematuria, dysuria Abdominal:   No nausea, vomiting, diarrhea, bright red blood per rectum, melena, or hematemesis Neurologic:  No visual changes, wkns, changes in mental status. All other systems reviewed and are otherwise negative except as noted above.  Physical Exam    VS:  BP (!) 120/58 (BP Location: Left Arm, Patient Position: Sitting, Cuff Size: Normal)   Pulse 84   Ht 6' (1.829 m)   Wt 183 lb 3.2 oz (83.1 kg)   SpO2 95%   BMI 24.85 kg/m  , BMI Body mass index  is 24.85 kg/m. GEN: Well nourished, well developed, in no acute distress. HEENT: normal. Neck: Supple, no JVD, carotid bruits, or masses. Cardiac: RRR, no murmurs, rubs, or gallops. No clubbing, cyanosis, generalized bilateral lower extremity edema.  Radials/DP/PT 2+ and equal bilaterally.   Respiratory:  Respirations regular and unlabored, clear to auscultation bilaterally. GI: Soft, nontender, nondistended, BS + x 4. MS: no deformity or atrophy. Skin: warm and dry, no rash.   Neuro:  Strength and sensation are intact. Psych: Normal affect.  Accessory Clinical Findings    Recent Labs: 06/03/2022: TSH 0.98 11/11/2022: Magnesium 2.5 12/06/2022: ALT 16; Hemoglobin 12.7; Platelets 229.0 03/02/2023: BNP 142.9; BUN 15; Creatinine, Ser 1.31; Potassium 5.0; Sodium 144   Recent Lipid Panel    Component Value Date/Time   CHOL 88 (L) 10/31/2022 0826   TRIG 122 10/31/2022 0826   HDL 49 10/31/2022 0826   CHOLHDL 1.8 10/31/2022 0826   CHOLHDL 4.5 07/10/2022 0742   VLDL 26 07/10/2022 0742   LDLCALC 17 10/31/2022 0826   LDLDIRECT 146.0 06/03/2022 0849         ECG personally reviewed by me today-   none today.   Echocardiogram 11/24/2022  IMPRESSIONS     1. Left ventricular ejection fraction, by estimation, is 60 to 65%. The  left ventricle has normal function. The left ventricle has no regional  wall motion abnormalities. Diastolic function indeterminant due to  significant MAC.   2. Right ventricular systolic function is normal. The right ventricular  size is normal. There is normal pulmonary artery systolic pressure. The  estimated right ventricular systolic pressure is 15.1 mmHg.   3. A small pericardial effusion is present. There is no evidence of  cardiac tamponade. Moderate pleural effusion in the left lateral region.   4. The mitral valve is degenerative. Trivial mitral valve regurgitation.  Moderate mitral annular calcification.   5. The aortic valve is tricuspid. There is moderate calcification of the  aortic valve. There is moderate thickening of the aortic valve. Aortic  valve regurgitation is not visualized. Mild aortic valve stenosis. Aortic  valve mean gradient measures 10.4  mmHg. Aortic valve Vmax measures 2.40 m/s.   6. Aortic dilatation noted.  There is borderline dilatation of the  ascending aorta, measuring 38 mm.   7. The inferior vena cava is normal in size with greater than 50%  respiratory variability, suggesting right atrial pressure of 3 mmHg.   Comparison(s): No significant change from prior study.   FINDINGS   Left Ventricle: Left ventricular ejection fraction, by estimation, is 60  to 65%. The left ventricle has normal function. The left ventricle has no  regional wall motion abnormalities. The left ventricular internal cavity  size was normal in size. There is   no left ventricular hypertrophy. Diastolic function indeterminant due to  significant MAC.   Right Ventricle: The right ventricular size is normal. No increase in  right ventricular wall thickness. Right ventricular systolic function is  normal. There is normal pulmonary artery systolic pressure. The tricuspid  regurgitant velocity is 1.74 m/s, and   with an assumed right atrial pressure of 3 mmHg, the estimated right  ventricular systolic pressure is 15.1 mmHg.   Left Atrium: Left atrial size was normal in size.   Right Atrium: Right atrial size was normal in size.   Pericardium: A small pericardial effusion is present. There is no evidence  of cardiac tamponade.   Mitral Valve: The mitral valve is degenerative in appearance. There is  moderate  thickening of the mitral valve leaflet(s). There is moderate  calcification of the mitral valve leaflet(s). Moderate mitral annular  calcification. Trivial mitral valve  regurgitation.   Tricuspid Valve: The tricuspid valve is normal in structure. Tricuspid  valve regurgitation is trivial.   Aortic Valve: The aortic valve is tricuspid. There is moderate  calcification of the aortic valve. There is moderate thickening of the  aortic valve. Aortic valve regurgitation is not visualized. Mild aortic  stenosis is present. Aortic valve mean gradient  measures 10.4 mmHg. Aortic valve peak gradient measures 23.0  mmHg. Aortic  valve area, by VTI measures 1.08 cm.   Pulmonic Valve: The pulmonic valve was grossly normal. Pulmonic valve  regurgitation is trivial.   Aorta: Aortic dilatation noted. There is borderline dilatation of the  ascending aorta, measuring 38 mm.   Venous: The inferior vena cava is normal in size with greater than 50%  respiratory variability, suggesting right atrial pressure of 3 mmHg.   IAS/Shunts: The atrial septum is grossly normal.   Additional Comments: There is a moderate pleural effusion in the left  lateral region.       Assessment & Plan   1.  Coronary artery disease-denies anginal symptoms.  He is status post CABG x 5 on 11/10/2022.  Presented to the emergency department on 11/24/2022 and was discharged on 11/27/2022.  He was noted to have pericardial effusion.  CT surgery consulted and felt he remained stable from a cardiac standpoint. Continue current medical therapy Heart healthy low-sodium diet Maintain physical activity  Hyperlipidemia-LDL 17 on 10/31/22. High-fiber diet Continue atorvastatin, fenofibrate, Repatha Plan for repeat fasting lipids 6/25  Atrial fibrillation-heart rate today 84.  Was noted to have atrial fibrillation postoperatively.  Converted to normal sinus rhythm on gtt. amiodarone. Continue p.o. amiodarone, metoprolol Avoid triggers caffeine, chocolate, EtOH, dehydration etc.-reviewed  Essential hypertension-BP today 120/58. Maintain blood pressure log Continue metoprolol, hydralazine, losartan  Type 2 diabetes-glucose 100 on 03/02/2023 Follows with PCP  Disposition: Follow-up with Dr. Flora Lipps or me in  6 months.   Collin Bridges. Cem Kosman NP-C     06/02/2023, 9:00 AM [facility] [address] Office [phone] Fax [phone]    I spent 14 minutes examining this patient, reviewing medications, and using patient centered shared decision making involving her cardiac care.  Prior to her  visit I spent greater than 20 minutes reviewing her past medical history,  medications, and prior cardiac tests.

## 2023-05-31 ENCOUNTER — Other Ambulatory Visit (HOSPITAL_COMMUNITY): Payer: Self-pay

## 2023-05-31 ENCOUNTER — Other Ambulatory Visit: Payer: Self-pay

## 2023-05-31 ENCOUNTER — Encounter: Payer: Self-pay | Admitting: Pharmacist

## 2023-06-01 NOTE — Telephone Encounter (Signed)
Contacted patient and let him know that new instructions were in mychart. Also informed patient that we received clearance for him to hold Plavix 5 days prior to his procedure.

## 2023-06-02 ENCOUNTER — Ambulatory Visit: Payer: 59 | Attending: General Practice | Admitting: General Practice

## 2023-06-02 ENCOUNTER — Encounter: Payer: Self-pay | Admitting: General Practice

## 2023-06-02 VITALS — BP 120/58 | HR 84 | Ht 72.0 in | Wt 183.2 lb

## 2023-06-02 DIAGNOSIS — I4891 Unspecified atrial fibrillation: Secondary | ICD-10-CM | POA: Diagnosis not present

## 2023-06-02 DIAGNOSIS — E1165 Type 2 diabetes mellitus with hyperglycemia: Secondary | ICD-10-CM

## 2023-06-02 DIAGNOSIS — E785 Hyperlipidemia, unspecified: Secondary | ICD-10-CM

## 2023-06-02 DIAGNOSIS — I25118 Atherosclerotic heart disease of native coronary artery with other forms of angina pectoris: Secondary | ICD-10-CM

## 2023-06-02 DIAGNOSIS — Z951 Presence of aortocoronary bypass graft: Secondary | ICD-10-CM

## 2023-06-02 DIAGNOSIS — I10 Essential (primary) hypertension: Secondary | ICD-10-CM

## 2023-06-02 NOTE — Patient Instructions (Signed)
Medication Instructions:  The current medical regimen is effective;  continue present plan and medications as directed. Please refer to the Current Medication list given to you today.  *If you need a refill on your cardiac medications before your next appointment, please call your pharmacy*  Lab Work: NONE  Other Instructions DO NOT [place] [facility] PLEASE READ AND FOLLOW ATTACHED  SALTY 6   Testing/Procedures: NONE  Follow-Up: At [facility], you and your health needs are our priority.  As part of our continuing mission to provide you with exceptional heart care, we have created designated Provider Care Teams.  These Care Teams include your primary Cardiologist (physician) and Advanced Practice Providers (APPs -  Physician Assistants and Nurse Practitioners) who all work together to provide you with the care you need, when you need it.  Your next appointment:   6 month(s)  Provider:   Reatha Harps, MD

## 2023-06-09 ENCOUNTER — Other Ambulatory Visit (HOSPITAL_COMMUNITY): Payer: Self-pay

## 2023-06-11 ENCOUNTER — Other Ambulatory Visit: Payer: Self-pay | Admitting: Internal Medicine

## 2023-06-12 ENCOUNTER — Other Ambulatory Visit: Payer: Self-pay

## 2023-06-14 ENCOUNTER — Other Ambulatory Visit (HOSPITAL_COMMUNITY): Payer: Self-pay

## 2023-06-14 MED FILL — Hydralazine HCl Tab 25 MG: ORAL | 30 days supply | Qty: 90 | Fill #2 | Status: AC

## 2023-06-21 ENCOUNTER — Other Ambulatory Visit (HOSPITAL_COMMUNITY): Payer: Self-pay

## 2023-06-21 ENCOUNTER — Telehealth: Payer: Self-pay | Admitting: Pharmacy Technician

## 2023-06-21 NOTE — Telephone Encounter (Signed)
 Pharmacy Patient Advocate Encounter   Received notification from Fax that prior authorization for Repatha  SureClick 140MG /ML auto-injectors is required/requested.   Insurance verification completed.   The patient is insured through [employer] .   Per test claim: PA required; PA submitted to above mentioned insurance via CoverMyMeds Key/confirmation #/EOC [id number] Status is pending

## 2023-06-22 ENCOUNTER — Other Ambulatory Visit (HOSPITAL_COMMUNITY): Payer: Self-pay

## 2023-06-22 NOTE — Telephone Encounter (Signed)
 Called and spoke to patient. Verified name and DOB. Notified patient Repatha  PA approved.

## 2023-06-22 NOTE — Telephone Encounter (Signed)
 Pharmacy Patient Advocate Encounter  Received notification from [facility] that Prior Authorization for Repatha  SureClick 140MG /ML auto-injectors  has been APPROVED from 06/21/23 to 05/15/24. Spoke to pharmacy to process.Copay is $0.00.    PA #/Case ID/Reference #: [id number]

## 2023-06-23 ENCOUNTER — Other Ambulatory Visit (HOSPITAL_COMMUNITY): Payer: Self-pay

## 2023-06-23 ENCOUNTER — Other Ambulatory Visit: Payer: Self-pay

## 2023-07-10 ENCOUNTER — Other Ambulatory Visit (HOSPITAL_COMMUNITY): Payer: Self-pay

## 2023-07-15 ENCOUNTER — Other Ambulatory Visit: Payer: Self-pay | Admitting: Internal Medicine

## 2023-07-15 ENCOUNTER — Other Ambulatory Visit (HOSPITAL_COMMUNITY): Payer: Self-pay

## 2023-07-17 ENCOUNTER — Other Ambulatory Visit: Payer: Self-pay

## 2023-07-17 ENCOUNTER — Other Ambulatory Visit (HOSPITAL_COMMUNITY): Payer: Self-pay

## 2023-07-17 MED ORDER — HYDRALAZINE HCL 25 MG PO TABS
25.0000 mg | ORAL_TABLET | Freq: Three times a day (TID) | ORAL | 2 refills | Status: DC
  Filled 2023-07-17: qty 90, 30d supply, fill #0

## 2023-07-19 ENCOUNTER — Other Ambulatory Visit (HOSPITAL_COMMUNITY): Payer: Self-pay

## 2023-07-20 ENCOUNTER — Ambulatory Visit: Payer: 59 | Admitting: Gastroenterology

## 2023-07-20 ENCOUNTER — Encounter: Payer: Self-pay | Admitting: Gastroenterology

## 2023-07-20 ENCOUNTER — Telehealth: Payer: Self-pay | Admitting: Gastroenterology

## 2023-07-20 VITALS — BP 137/76 | HR 69 | Temp 97.9°F | Resp 15 | Ht 72.0 in | Wt 183.0 lb

## 2023-07-20 DIAGNOSIS — Z1211 Encounter for screening for malignant neoplasm of colon: Secondary | ICD-10-CM | POA: Diagnosis not present

## 2023-07-20 DIAGNOSIS — K635 Polyp of colon: Secondary | ICD-10-CM

## 2023-07-20 DIAGNOSIS — Z860101 Personal history of adenomatous and serrated colon polyps: Secondary | ICD-10-CM

## 2023-07-20 DIAGNOSIS — Z860100 Personal history of colon polyps, unspecified: Secondary | ICD-10-CM

## 2023-07-20 MED ORDER — SODIUM CHLORIDE 0.9 % IV SOLN: 500.0000 mL | INTRAVENOUS | Status: DC

## 2023-07-20 NOTE — Patient Instructions (Signed)
 Will need a repeat colonoscopy with additional prep and off Plavix for 5 days   YOU HAD AN ENDOSCOPIC PROCEDURE TODAY AT THE [facility]:   Refer to the procedure report that was given to you for any specific questions about what was found during the examination.  If the procedure report does not answer your questions, please call your gastroenterologist to clarify.  If you requested that your care partner not be given the details of your procedure findings, then the procedure report has been included in a sealed envelope for you to review at your convenience later.  YOU SHOULD EXPECT: Some feelings of bloating in the abdomen. Passage of more gas than usual.  Walking can help get rid of the air that was put into your GI tract during the procedure and reduce the bloating. If you had a lower endoscopy (such as a colonoscopy or flexible sigmoidoscopy) you may notice spotting of blood in your stool or on the toilet paper. If you underwent a bowel prep for your procedure, you may not have a normal bowel movement for a few days.  Please Note:  You might notice some irritation and congestion in your nose or some drainage.  This is from the oxygen used during your procedure.  There is no need for concern and it should clear up in a day or so.  SYMPTOMS TO REPORT IMMEDIATELY:  Following lower endoscopy (colonoscopy or flexible sigmoidoscopy):  Excessive amounts of blood in the stool  Significant tenderness or worsening of abdominal pains  Swelling of the abdomen that is new, acute  Fever of 100F or higher  For urgent or emergent issues, a gastroenterologist can be reached at any hour by calling [phone]. Do not use MyChart messaging for urgent concerns.    DIET:  We do recommend a small meal at first, but then you may proceed to your regular diet.  Drink plenty of fluids but you should avoid alcoholic beverages for 24 hours.  ACTIVITY:  You should plan to take it easy for the rest of  today and you should NOT DRIVE or use heavy machinery until tomorrow (because of the sedation medicines used during the test).    FOLLOW UP: Our staff will call the number listed on your records the next business day following your procedure.  We will call around 7:15- 8:00 am to check on you and address any questions or concerns that you may have regarding the information given to you following your procedure. If we do not reach you, we will leave a message.      SIGNATURES/CONFIDENTIALITY: You and/or your care partner have signed paperwork which will be entered into your electronic medical record.  These signatures attest to the fact that that the information above on your After Visit Summary has been reviewed and is understood.  Full responsibility of the confidentiality of this discharge information lies with you and/or your care-partner.

## 2023-07-20 NOTE — Telephone Encounter (Signed)
 Inbound call from patient stating he was advised that he needs to have a repeat colonoscopy in April after today's 3/6 attempt. Patient has been scheduled for 4/21. Please advise, thank you.

## 2023-07-20 NOTE — Telephone Encounter (Signed)
 Noted pt has been scheduled for previsit and colon.  Samantha see note regarding appt for colon and previsit  in case he was on your list to call.  He has been scheduled.

## 2023-07-20 NOTE — Op Note (Signed)
 [facility] Patient Name: Collin Bridges Procedure Date: 07/20/2023 9:55 AM MRN: [id number] Endoscopist: Lorin Picket E. Tomasa Rand , MD, [id number] Age: 78 Referring MD:  Date of Birth: [birth year] Gender: Male Account #: [id number] Procedure:                Colonoscopy Indications:              Surveillance: History of piecemeal removal adenoma                            on last colonoscopy (< 3 yrs); patient had a 25 mm                            TA removed piecemeal from the [facility] in Oct 2023,                            recommended to repeat in 6-9 months, but patient                            underwent CABG, delaying the repeat colonoscopy. He                            was instructed to hold his Plavix for 5 days, but                            believes that he continued taking it, including the                            day of the procedure. Medicines:                Monitored Anesthesia Care Procedure:                Pre-Anesthesia Assessment:                           - Prior to the procedure, a History and Physical                            was performed, and patient medications and                            allergies were reviewed. The patient's tolerance of                            previous anesthesia was also reviewed. The risks                            and benefits of the procedure and the sedation                            options and risks were discussed with the patient.                            All questions were answered, and informed consent  was obtained. Prior Anticoagulants: The patient has                            taken Plavix (clopidogrel), last dose was day of                            procedure. ASA Grade Assessment: III - A patient                            with severe systemic disease. After reviewing the                            risks and benefits, the patient was deemed in                            satisfactory  condition to undergo the procedure.                           After obtaining informed consent, the colonoscope                            was passed under direct vision. Throughout the                            procedure, the patient's blood pressure, pulse, and                            oxygen saturations were monitored continuously. The                            CF [id number] #[id number] was introduced through the anus                            and advanced to the the cecum, identified by the                            ileocecal valve. The colonoscopy was performed                            without difficulty. The patient tolerated the                            procedure well. The quality of the bowel                            preparation was good except the cecum was                            unsatisfactory. There was copious solid debris                            which clogged the suction channel, and complete  visualization of the cecum was not acheived. The                            appendiceal orifice was not identified. The                            ileocecal valve and the rectum were photographed.                            The bowel preparation used was SUPREP via split                            dose instruction. Scope In: 10:13:28 AM Scope Out: 10:26:21 AM Scope Withdrawal Time: 0 hours 9 minutes 15 seconds  Total Procedure Duration: 0 hours 12 minutes 53 seconds  Findings:                 The perianal and digital rectal examinations were                            normal. Pertinent negatives include normal                            sphincter tone and no palpable rectal lesions.                           A 12 mm polyp was found in the ascending colon,                            corresponding to the previous large polyp site. The                            polyp was sessile.                           A tattoo was seen in the ascending colon, just                             distal to the polyp.                           The exam was otherwise normal throughout the                            examined colon.                           The retroflexed view of the distal rectum and anal                            verge was normal and showed no anal or rectal                            abnormalities. Complications:            No immediate  complications. Estimated Blood Loss:     Estimated blood loss: none. Impression:               - One 12 mm polyp in the ascending colon,                            consistent with remnant polyp from the previously                            polypectomy in Oct 2023. This was not removed as                            the patient had taken Plavix this morning.                           - A tattoo was seen in the ascending colon.                           - The distal rectum and anal verge are normal on                            retroflexion view.                           - No specimens collected. Recommendation:           - Patient has a contact number available for                            emergencies. The signs and symptoms of potential                            delayed complications were discussed with the                            patient. Return to normal activities tomorrow.                            Written discharge instructions were provided to the                            patient.                           - Resume previous diet.                           - Continue present medications.                           - Repeat colonoscopy at the next available                            appointment to remove the remnant polyp when  patient has been off Plavix for 5 days.                           - Consider additional bowel prep given inability to                            clear cecum today Gladiola Madore E. Tomasa Rand, MD 07/20/2023 10:36:52 AM This report has been signed  electronically.

## 2023-07-20 NOTE — Progress Notes (Signed)
 Report to PACU, RN, vss, BBS= Clear.

## 2023-07-20 NOTE — Progress Notes (Signed)
 [facility] History and Physical   Primary Care Physician:  Corwin Levins, MD   Reason for Procedure:   Follow up large colon polyp  Plan:    Colonoscopy     HPI: Collin Bridges is a 78 y.o. male undergoing repeat colonoscopy.  He underwent a colonoscopy in Oct 2023 for screening and was found to have a 25 mm tubular adenoma in the ascending colon removed piecemeal and several other smaller polyps.  He had been recommended to repeat in 6-9 months, but subsequently was found to have significant CAD and underwent a CABG in June 2024. He takes Plavix and was instructed to hold his Plavix for 5 days after cardiology clearance, but the patient does not recall this and does not know if he has been taking his Plavix or not.    Past Medical History:  Diagnosis Date   Allergy    seasonal   Anginal pain (HCC)    Anxiety    CHF (congestive heart failure) (HCC)    Coronary artery disease    Depression    Diabetes mellitus without complication (HCC)    Type 2   GERD (gastroesophageal reflux disease)    Pt states "take ambien for this"   Hepatitis C    HLD (hyperlipidemia) 08/09/2017   Hypertension    Hypoglycemia    Pneumonia 06/2022   Stroke (HCC) 03/2021   TIA    Past Surgical History:  Procedure Laterality Date   APPENDECTOMY     BUBBLE STUDY  06/16/2021   Procedure: BUBBLE STUDY;  Surgeon: Sande Rives, MD;  Location: [facility] ENDOSCOPY;  Service: Cardiovascular;;   COLONOSCOPY     CORONARY ARTERY BYPASS GRAFT N/A 11/10/2022   Procedure: CORONARY ARTERY BYPASS GRAFTING (CABG) X5 USING LEFT INTERNAL MAMMARY ARTERY (LIMA) AND ENDOSCOPICALLY HARVESTED RIGHT AND LEFT GREATER SAPHENOUS VEIN;  Surgeon: Eugenio Hoes, MD;  Location: [facility] OR;  Service: Open Heart Surgery;  Laterality: N/A;   FRACTURE SURGERY Left    Ankle   LEFT HEART CATH AND CORONARY ANGIOGRAPHY N/A 07/12/2022   Procedure: LEFT HEART CATH AND CORONARY ANGIOGRAPHY;  Surgeon: Marykay Lex, MD;   Location: [facility] INVASIVE CV LAB;  Service: Cardiovascular;  Laterality: N/A;   TEE WITHOUT CARDIOVERSION N/A 06/16/2021   Procedure: TRANSESOPHAGEAL ECHOCARDIOGRAM (TEE);  Surgeon: Sande Rives, MD;  Location: [facility] ENDOSCOPY;  Service: Cardiovascular;  Laterality: N/A;   TEE WITHOUT CARDIOVERSION N/A 11/10/2022   Procedure: TRANSESOPHAGEAL ECHOCARDIOGRAM;  Surgeon: Eugenio Hoes, MD;  Location: [facility] OR;  Service: Open Heart Surgery;  Laterality: N/A;   TONSILECTOMY, ADENOIDECTOMY, BILATERAL MYRINGOTOMY AND TUBES  1955    Prior to Admission medications   Medication Sig Start Date End Date Taking? Authorizing Provider  aspirin EC 81 MG tablet Take 1 tablet (81 mg total) by mouth daily. Swallow whole. 03/02/23  Yes O'Neal, Ronnald Ramp, MD  atorvastatin (LIPITOR) 80 MG tablet Take 1 tablet (80 mg total) by mouth daily. 03/30/23 09/26/23 Yes Corwin Levins, MD  cholecalciferol (VITAMIN D3) 25 MCG (1000 UNIT) tablet Take 1 tablet (1,000 Units total) by mouth daily. Take 1,000 Units by mouth daily. 11/22/21  Yes Corwin Levins, MD  Evolocumab (REPATHA SURECLICK) 140 MG/ML SOAJ Inject 140 mg into the skin every 14 days. 10/06/22  Yes O'Neal, Ronnald Ramp, MD  fenofibrate 160 MG tablet Take 1 tablet (160 mg total) by mouth daily. 05/29/23  Yes Corwin Levins, MD  fluticasone [facility]) 50 MCG/ACT nasal spray Place 2 sprays into  both nostrils daily. 11/09/22  Yes Corwin Levins, MD  furosemide (LASIX) 20 MG tablet Take 1 tablet (20 mg total) by mouth daily. 03/02/23 09/05/23 Yes O'Neal, Ronnald Ramp, MD  gabapentin (NEURONTIN) 300 MG capsule Take 1 capsule (300 mg total) by mouth at bedtime. 08/08/22  Yes Plotnikov, Georgina Quint, MD  hydrALAZINE (APRESOLINE) 25 MG tablet Take 1 tablet (25 mg total) by mouth every 8 (eight) hours. 07/17/23  Yes Corwin Levins, MD  losartan (COZAAR) 100 MG tablet Take 1 tablet (100 mg total) by mouth daily. 11/09/22  Yes Corwin Levins, MD  magnesium oxide (MAG-OX) 400 (240 Mg) MG tablet  Take 400 mg by mouth daily.   Yes [provider]  metFORMIN (GLUCOPHAGE-XR) 500 MG 24 hr tablet TAKE 1 TABLET BY MOUTH ONCE  DAILY WITH BREAKFAST 06/12/23  Yes Corwin Levins, MD  metoprolol tartrate (LOPRESSOR) 25 MG tablet Take 1 tablet (25 mg total) by mouth 2 (two) times daily. 12/08/22  Yes O'Neal, Ronnald Ramp, MD  PARoxetine (PAXIL) 40 MG tablet Take 1 tablet (40 mg total) by mouth every morning. 05/29/23  Yes Corwin Levins, MD  vitamin E 180 MG (400 UNITS) capsule Take 400 Units by mouth daily.   Yes [provider]  zolpidem (AMBIEN) 10 MG tablet TAKE 1 TABLET(10 MG) BY MOUTH AT BEDTIME AS NEEDED FOR SLEEP 04/06/23  Yes Corwin Levins, MD  clopidogrel (PLAVIX) 75 MG tablet Take 1 tablet (75 mg total) by mouth daily. 03/02/23   O'Neal, Ronnald Ramp, MD  nitroGLYCERIN (NITROSTAT) 0.4 MG SL tablet Place 1 tablet under the tongue every 5 minutes as needed for chest pain. 10/06/22 01/04/23  O'NealRonnald Ramp, MD    Current Outpatient Medications  Medication Sig Dispense Refill   aspirin EC 81 MG tablet Take 1 tablet (81 mg total) by mouth daily. Swallow whole. 30 tablet 12   atorvastatin (LIPITOR) 80 MG tablet Take 1 tablet (80 mg total) by mouth daily. 90 tablet 3   cholecalciferol (VITAMIN D3) 25 MCG (1000 UNIT) tablet Take 1 tablet (1,000 Units total) by mouth daily. Take 1,000 Units by mouth daily. 90 tablet 2   Evolocumab (REPATHA SURECLICK) 140 MG/ML SOAJ Inject 140 mg into the skin every 14 days. 6 mL 3   fenofibrate 160 MG tablet Take 1 tablet (160 mg total) by mouth daily. 90 tablet 1   fluticasone (FLONASE) 50 MCG/ACT nasal spray Place 2 sprays into both nostrils daily. 48 g 1   furosemide (LASIX) 20 MG tablet Take 1 tablet (20 mg total) by mouth daily. 90 tablet 3   gabapentin (NEURONTIN) 300 MG capsule Take 1 capsule (300 mg total) by mouth at bedtime. 90 capsule 3   hydrALAZINE (APRESOLINE) 25 MG tablet Take 1 tablet (25 mg total) by mouth every 8 (eight) hours.  90 tablet 2   losartan (COZAAR) 100 MG tablet Take 1 tablet (100 mg total) by mouth daily. 180 tablet 1   magnesium oxide (MAG-OX) 400 (240 Mg) MG tablet Take 400 mg by mouth daily.     metFORMIN (GLUCOPHAGE-XR) 500 MG 24 hr tablet TAKE 1 TABLET BY MOUTH ONCE  DAILY WITH BREAKFAST 100 tablet 2   metoprolol tartrate (LOPRESSOR) 25 MG tablet Take 1 tablet (25 mg total) by mouth 2 (two) times daily. 180 tablet 3   PARoxetine (PAXIL) 40 MG tablet Take 1 tablet (40 mg total) by mouth every morning. 90 tablet 1   vitamin E 180 MG (400 UNITS)  capsule Take 400 Units by mouth daily.     zolpidem (AMBIEN) 10 MG tablet TAKE 1 TABLET(10 MG) BY MOUTH AT BEDTIME AS NEEDED FOR SLEEP 90 tablet 1   clopidogrel (PLAVIX) 75 MG tablet Take 1 tablet (75 mg total) by mouth daily. 90 tablet 3   nitroGLYCERIN (NITROSTAT) 0.4 MG SL tablet Place 1 tablet under the tongue every 5 minutes as needed for chest pain. 25 tablet 3   Current Facility-Administered Medications  Medication Dose Route Frequency Provider Last Rate Last Admin   0.9 %  sodium chloride infusion  500 mL Intravenous Continuous Jenel Lucks, MD        Allergies as of 07/20/2023   (No Known Allergies)    Family History  Problem Relation Age of Onset   Lung disease Mother    Hyperlipidemia Father    Hypertension Father     Social History   Socioeconomic History   Marital status: Single    Spouse name: Not on file   Number of children: 0   Years of education: some college   Highest education level: Associate degree: occupational, Scientist, product/process development, or vocational program  Occupational History   Occupation: retired  Tobacco Use   Smoking status: Former   Smokeless tobacco: Never   Tobacco comments:    quit 82yrs ago, smoke a cigar occassionally.  Vaping Use   Vaping status: Never Used  Substance and Sexual Activity   Alcohol use: Not Currently    Comment: Quit 30 years ago   Drug use: No   Sexual activity: Yes  Other Topics Concern    Not on file  Social History Narrative   Lives alone.   Right-handed.   Two cups caffeine per day.   Social Drivers of Corporate investment banker Strain: Low Risk  (12/06/2022)   Overall Financial Resource Strain (CARDIA)    Difficulty of Paying Living Expenses: Not very hard  Food Insecurity: Food Insecurity Present (12/06/2022)   Hunger Vital Sign    Worried About Running Out of Food in the Last Year: Sometimes true    Ran Out of Food in the Last Year: Never true  Transportation Needs: No Transportation Needs (12/06/2022)   PRAPARE - Administrator, Civil Service (Medical): No    Lack of Transportation (Non-Medical): No  Physical Activity: Sufficiently Active (12/06/2022)   Exercise Vital Sign    Days of Exercise per Week: 7 days    Minutes of Exercise per Session: 40 min  Recent Concern: Physical Activity - Insufficiently Active (09/29/2022)   Exercise Vital Sign    Days of Exercise per Week: 1 day    Minutes of Exercise per Session: 10 min  Stress: Stress Concern Present (12/06/2022)   [employer] of Occupational Health - Occupational Stress Questionnaire    Feeling of Stress : To some extent  Social Connections: Unknown (12/21/2022)   Received from [facility]   Social Network    Social Network: Not on file  Recent Concern: Social Connections - Socially Isolated (12/06/2022)   Social Connection and Isolation Panel [NHANES]    Frequency of Communication with Friends and Family: Twice a week    Frequency of Social Gatherings with Friends and Family: Once a week    Attends Religious Services: Never    Database administrator or Organizations: No    Attends Banker Meetings: Never    Marital Status: Divorced  Catering manager Violence: Unknown (12/21/2022)   Received from [facility]   HITS    Physically Hurt: Not on file    Insult or Talk Down To: Not on file    Threaten Physical Harm: Not on file    Scream or Curse: Not on file    Review of  Systems:  All other review of systems negative except as mentioned in the HPI.  Physical Exam: Vital signs BP 131/70   Pulse 60   Temp 97.9 F (36.6 C)   Ht 6' (1.829 m)   Wt 183 lb (83 kg)   SpO2 97%   BMI 24.82 kg/m   General:   Alert,  Well-developed, well-nourished, pleasant and cooperative in NAD Airway:  Mallampati 3 Lungs:  Clear throughout to auscultation.   Heart:  Regular rate and rhythm; no murmurs, clicks, rubs,  or gallops. Abdomen:  Soft, nontender and nondistended. Normal bowel sounds.   Neuro/Psych:  Normal mood and affect. A and O x 3   Tierria Watson E. Tomasa Rand, MD [facility]

## 2023-07-21 ENCOUNTER — Telehealth: Payer: Self-pay

## 2023-07-21 NOTE — Telephone Encounter (Signed)
  Follow up Call-     07/20/2023    9:16 AM 02/15/2022    9:53 AM  Call back number  Post procedure Call Back phone  # [phone]  Permission to leave phone message Yes Yes     Patient questions:  Do you have a fever, pain , or abdominal swelling? No. Pain Score  0 *  Have you tolerated food without any problems? Yes.    Have you been able to return to your normal activities? Yes.    Do you have any questions about your discharge instructions: Diet   No. Medications  No. Follow up visit  No.  Do you have questions or concerns about your Care? No.  Actions: * If pain score is 4 or above: No action needed, pain <4.

## 2023-07-22 ENCOUNTER — Other Ambulatory Visit: Payer: Self-pay | Admitting: Gastroenterology

## 2023-07-22 ENCOUNTER — Other Ambulatory Visit (HOSPITAL_COMMUNITY): Payer: Self-pay

## 2023-07-25 ENCOUNTER — Other Ambulatory Visit (HOSPITAL_COMMUNITY): Payer: Self-pay

## 2023-07-26 ENCOUNTER — Other Ambulatory Visit (HOSPITAL_COMMUNITY): Payer: Self-pay

## 2023-07-27 ENCOUNTER — Ambulatory Visit (INDEPENDENT_AMBULATORY_CARE_PROVIDER_SITE_OTHER): Payer: 59 | Admitting: Internal Medicine

## 2023-07-27 ENCOUNTER — Encounter: Payer: Self-pay | Admitting: Internal Medicine

## 2023-07-27 VITALS — BP 130/82 | HR 76 | Temp 97.8°F | Ht 72.0 in | Wt 185.0 lb

## 2023-07-27 DIAGNOSIS — Z0001 Encounter for general adult medical examination with abnormal findings: Secondary | ICD-10-CM | POA: Diagnosis not present

## 2023-07-27 DIAGNOSIS — N529 Male erectile dysfunction, unspecified: Secondary | ICD-10-CM | POA: Diagnosis not present

## 2023-07-27 DIAGNOSIS — E1165 Type 2 diabetes mellitus with hyperglycemia: Secondary | ICD-10-CM

## 2023-07-27 DIAGNOSIS — N1831 Chronic kidney disease, stage 3a: Secondary | ICD-10-CM

## 2023-07-27 DIAGNOSIS — E538 Deficiency of other specified B group vitamins: Secondary | ICD-10-CM | POA: Diagnosis not present

## 2023-07-27 DIAGNOSIS — E559 Vitamin D deficiency, unspecified: Secondary | ICD-10-CM | POA: Diagnosis not present

## 2023-07-27 DIAGNOSIS — I10 Essential (primary) hypertension: Secondary | ICD-10-CM | POA: Diagnosis not present

## 2023-07-27 DIAGNOSIS — E785 Hyperlipidemia, unspecified: Secondary | ICD-10-CM

## 2023-07-27 LAB — CBC WITH DIFFERENTIAL/PLATELET
Basophils Absolute: 0 10*3/uL (ref 0.0–0.1)
Basophils Relative: 0.6 % (ref 0.0–3.0)
Eosinophils Absolute: 0.2 10*3/uL (ref 0.0–0.7)
Eosinophils Relative: 3.9 % (ref 0.0–5.0)
HCT: 37.7 % — ABNORMAL LOW (ref 39.0–52.0)
Hemoglobin: 12.7 g/dL — ABNORMAL LOW (ref 13.0–17.0)
Lymphocytes Relative: 19.6 % (ref 12.0–46.0)
Lymphs Abs: 1.2 10*3/uL (ref 0.7–4.0)
MCHC: 33.6 g/dL (ref 30.0–36.0)
MCV: 93.9 fl (ref 78.0–100.0)
Monocytes Absolute: 0.7 10*3/uL (ref 0.1–1.0)
Monocytes Relative: 11 % (ref 3.0–12.0)
Neutro Abs: 4 10*3/uL (ref 1.4–7.7)
Neutrophils Relative %: 64.9 % (ref 43.0–77.0)
Platelets: 134 10*3/uL — ABNORMAL LOW (ref 150.0–400.0)
RBC: 4.02 Mil/uL — ABNORMAL LOW (ref 4.22–5.81)
RDW: 15.4 % (ref 11.5–15.5)
WBC: 6.2 10*3/uL (ref 4.0–10.5)

## 2023-07-27 LAB — HEMOGLOBIN A1C: Hgb A1c MFr Bld: 6.2 % (ref 4.6–6.5)

## 2023-07-27 LAB — URINALYSIS, ROUTINE W REFLEX MICROSCOPIC
Bilirubin Urine: NEGATIVE
Hgb urine dipstick: NEGATIVE
Ketones, ur: NEGATIVE
Leukocytes,Ua: NEGATIVE
Nitrite: NEGATIVE
RBC / HPF: NONE SEEN (ref 0–?)
Specific Gravity, Urine: 1.015 (ref 1.000–1.030)
Total Protein, Urine: NEGATIVE
Urine Glucose: NEGATIVE
Urobilinogen, UA: 0.2 (ref 0.0–1.0)
pH: 7.5 (ref 5.0–8.0)

## 2023-07-27 LAB — HEPATIC FUNCTION PANEL
ALT: 18 U/L (ref 0–53)
AST: 21 U/L (ref 0–37)
Albumin: 4.4 g/dL (ref 3.5–5.2)
Alkaline Phosphatase: 65 U/L (ref 39–117)
Bilirubin, Direct: 0.2 mg/dL (ref 0.0–0.3)
Total Bilirubin: 0.6 mg/dL (ref 0.2–1.2)
Total Protein: 6.9 g/dL (ref 6.0–8.3)

## 2023-07-27 LAB — BASIC METABOLIC PANEL
BUN: 25 mg/dL — ABNORMAL HIGH (ref 6–23)
CO2: 31 meq/L (ref 19–32)
Calcium: 10 mg/dL (ref 8.4–10.5)
Chloride: 103 meq/L (ref 96–112)
Creatinine, Ser: 1.83 mg/dL — ABNORMAL HIGH (ref 0.40–1.50)
GFR: 34.94 mL/min — ABNORMAL LOW (ref >60.00)
Glucose, Bld: 80 mg/dL (ref 70–99)
Potassium: 4.9 meq/L (ref 3.5–5.1)
Sodium: 141 meq/L (ref 135–145)

## 2023-07-27 LAB — VITAMIN B12: Vitamin B-12: 834 pg/mL (ref 211–911)

## 2023-07-27 LAB — LIPID PANEL
Cholesterol: 90 mg/dL (ref 0–200)
HDL: 56.8 mg/dL (ref >39.00)
LDL Cholesterol: 6 mg/dL (ref 0–99)
NonHDL: 33.63
Total CHOL/HDL Ratio: 2
Triglycerides: 139 mg/dL (ref 0.0–149.0)
VLDL: 27.8 mg/dL (ref 0.0–40.0)

## 2023-07-27 LAB — MICROALBUMIN / CREATININE URINE RATIO
Creatinine,U: 56 mg/dL
Microalb Creat Ratio: UNDETERMINED mg/g (ref 0.0–30.0)
Microalb, Ur: <0.7 mg/dL

## 2023-07-27 LAB — TSH: TSH: 0.89 u[IU]/mL (ref 0.35–5.50)

## 2023-07-27 LAB — VITAMIN D 25 HYDROXY (VIT D DEFICIENCY, FRACTURES): VITD: 73.37 ng/mL (ref 30.00–100.00)

## 2023-07-27 MED ORDER — LOSARTAN POTASSIUM 100 MG PO TABS: 100.0000 mg | ORAL_TABLET | Freq: Every day | ORAL | 3 refills | Status: DC

## 2023-07-27 MED ORDER — HYDRALAZINE HCL 25 MG PO TABS: 25.0000 mg | ORAL_TABLET | Freq: Three times a day (TID) | ORAL | 3 refills | Status: DC

## 2023-07-27 MED ORDER — ZOLPIDEM TARTRATE 10 MG PO TABS: ORAL_TABLET | ORAL | 1 refills | Status: DC

## 2023-07-27 MED ORDER — METOPROLOL TARTRATE 25 MG PO TABS: 25.0000 mg | ORAL_TABLET | Freq: Two times a day (BID) | ORAL | 3 refills | Status: DC

## 2023-07-27 MED ORDER — ATORVASTATIN CALCIUM 80 MG PO TABS: 80.0000 mg | ORAL_TABLET | Freq: Every day | ORAL | 3 refills | Status: DC

## 2023-07-27 MED ORDER — PAROXETINE HCL 40 MG PO TABS: 40.0000 mg | ORAL_TABLET | ORAL | 3 refills | Status: DC

## 2023-07-27 MED ORDER — FENOFIBRATE 160 MG PO TABS: 160.0000 mg | ORAL_TABLET | Freq: Every day | ORAL | 3 refills | Status: DC

## 2023-07-27 MED ORDER — GABAPENTIN 300 MG PO CAPS
300.0000 mg | ORAL_CAPSULE | Freq: Every day | ORAL | 1 refills | Status: DC
  Filled 2023-09-20: qty 90, 90d supply, fill #0
  Filled 2023-12-21: qty 90, 90d supply, fill #1

## 2023-07-27 MED ORDER — TADALAFIL 20 MG PO TABS: 10.0000 mg | ORAL_TABLET | ORAL | 11 refills | Status: AC | PRN

## 2023-07-27 NOTE — Patient Instructions (Addendum)
 Ok for generic cialis as needed  Please continue all other medications as before, and refills have been done if requested.  Please have the pharmacy call with any other refills you may need.  Please continue your efforts at being more active, low cholesterol diet, and weight control.  You are otherwise up to date with prevention measures today.  Please keep your appointments with your specialists as you may have planned  Please go to the LAB at the blood drawing area for the tests to be done  You will be contacted by phone if any changes need to be made immediately.  Otherwise, you will receive a letter about your results with an explanation, but please check with MyChart first.  Please make an Appointment to return in 6 months, or sooner if needed

## 2023-07-27 NOTE — Progress Notes (Signed)
 Patient ID: Collin Bridges, male   DOB: April 22, 1946, 78 y.o.   MRN: [id number]         Chief Complaint:: wellness exam and dm, htn, hld, ckd3a, low vit d       HPI:  Collin Bridges is a 78 y.o. male here for wellness exam; sched for colonoscopy next month; o/w up to date                Also ED worsening in past 6 mo.  Pt denies chest pain, increased sob or doe, wheezing, orthopnea, PND, increased LE swelling, palpitations, dizziness or syncope.   Pt denies polydipsia, polyuria, or new focal neuro s/s.    Pt denies fever, wt loss, night sweats, loss of appetite, or other constitutional symptoms     Wt Readings from Last 3 Encounters:  07/27/23 185 lb (83.9 kg)  07/20/23 183 lb (83 kg)  06/02/23 183 lb 3.2 oz (83.1 kg)   BP Readings from Last 3 Encounters:  07/27/23 130/82  07/20/23 137/76  06/02/23 (!) 120/58   Immunization History  Administered Date(s) Administered   DTaP 05/17/2007   Fluad Quad(high Dose 65+) 03/22/2021   Fluad Trivalent(High Dose 65+) 01/27/2023   Influenza, High Dose Seasonal PF 02/11/2016   Influenza,inj,Quad PF,6+ Mos 08/09/2017   Influenza-Unspecified 05/22/2020, 03/01/2022   Moderna Sars-Covid-2 Vaccination 09/11/2019, 10/09/2019   PNEUMOCOCCAL CONJUGATE-20 07/12/2022   Pneumococcal Conjugate-13 07/29/2016   Pneumococcal Polysaccharide-23 05/16/2009, 10/03/2019   RSV,unspecified 05/02/2022   Td 05/18/2022   Tdap 10/03/2019   Zoster Recombinant(Shingrix) 12/05/2021, 04/18/2022   There are no preventive care reminders to display for this patient.     Past Medical History:  Diagnosis Date   Allergy    seasonal   Anginal pain (HCC)    Anxiety    CHF (congestive heart failure) (HCC)    Coronary artery disease    Depression    Diabetes mellitus without complication (HCC)    Type 2   GERD (gastroesophageal reflux disease)    Pt states "take ambien for this"   Hepatitis C    HLD (hyperlipidemia) 08/09/2017   Hypertension     Hypoglycemia    Pneumonia 06/2022   Stroke (HCC) 03/2021   TIA   Past Surgical History:  Procedure Laterality Date   APPENDECTOMY     BUBBLE STUDY  06/16/2021   Procedure: BUBBLE STUDY;  Surgeon: Sande Rives, MD;  Location: [facility] ENDOSCOPY;  Service: Cardiovascular;;   COLONOSCOPY     CORONARY ARTERY BYPASS GRAFT N/A 11/10/2022   Procedure: CORONARY ARTERY BYPASS GRAFTING (CABG) X5 USING LEFT INTERNAL MAMMARY ARTERY (LIMA) AND ENDOSCOPICALLY HARVESTED RIGHT AND LEFT GREATER SAPHENOUS VEIN;  Surgeon: Eugenio Hoes, MD;  Location: [facility] OR;  Service: Open Heart Surgery;  Laterality: N/A;   FRACTURE SURGERY Left    Ankle   LEFT HEART CATH AND CORONARY ANGIOGRAPHY N/A 07/12/2022   Procedure: LEFT HEART CATH AND CORONARY ANGIOGRAPHY;  Surgeon: Marykay Lex, MD;  Location: [facility] INVASIVE CV LAB;  Service: Cardiovascular;  Laterality: N/A;   TEE WITHOUT CARDIOVERSION N/A 06/16/2021   Procedure: TRANSESOPHAGEAL ECHOCARDIOGRAM (TEE);  Surgeon: Sande Rives, MD;  Location: [facility] ENDOSCOPY;  Service: Cardiovascular;  Laterality: N/A;   TEE WITHOUT CARDIOVERSION N/A 11/10/2022   Procedure: TRANSESOPHAGEAL ECHOCARDIOGRAM;  Surgeon: Eugenio Hoes, MD;  Location: [facility] OR;  Service: Open Heart Surgery;  Laterality: N/A;   TONSILECTOMY, ADENOIDECTOMY, BILATERAL MYRINGOTOMY AND TUBES  1955    reports that he has quit smoking. He  has never used smokeless tobacco. He reports that he does not currently use alcohol. He reports that he does not use drugs. family history includes Hyperlipidemia in his father; Hypertension in his father; Lung disease in his mother. No Known Allergies Current Outpatient Medications on File Prior to Visit  Medication Sig Dispense Refill   aspirin EC 81 MG tablet Take 1 tablet (81 mg total) by mouth daily. Swallow whole. 30 tablet 12   cholecalciferol (VITAMIN D3) 25 MCG (1000 UNIT) tablet Take 1 tablet (1,000 Units total) by mouth daily. Take 1,000 Units by mouth daily. 90  tablet 2   clopidogrel (PLAVIX) 75 MG tablet Take 1 tablet (75 mg total) by mouth daily. 90 tablet 3   Evolocumab (REPATHA SURECLICK) 140 MG/ML SOAJ Inject 140 mg into the skin every 14 days. 6 mL 3   fluticasone (FLONASE) 50 MCG/ACT nasal spray Place 2 sprays into both nostrils daily. 48 g 1   furosemide (LASIX) 20 MG tablet Take 1 tablet (20 mg total) by mouth daily. 90 tablet 3   magnesium oxide (MAG-OX) 400 (240 Mg) MG tablet Take 400 mg by mouth daily.     vitamin E 180 MG (400 UNITS) capsule Take 400 Units by mouth daily.     nitroGLYCERIN (NITROSTAT) 0.4 MG SL tablet Place 1 tablet under the tongue every 5 minutes as needed for chest pain. 25 tablet 3   No current facility-administered medications on file prior to visit.        ROS:  All others reviewed and negative.  Objective        PE:  BP 130/82 (BP Location: Left Arm, Patient Position: Sitting, Cuff Size: Normal)   Pulse 76   Temp 97.8 F (36.6 C) (Oral)   Ht 6' (1.829 m)   Wt 185 lb (83.9 kg)   SpO2 96%   BMI 25.09 kg/m                 Constitutional: Pt appears in NAD               HENT: Head: NCAT.                Right Ear: External ear normal.                 Left Ear: External ear normal.                Eyes: . Pupils are equal, round, and reactive to light. Conjunctivae and EOM are normal               Nose: without d/c or deformity               Neck: Neck supple. Gross normal ROM               Cardiovascular: Normal rate and regular rhythm.                 Pulmonary/Chest: Effort normal and breath sounds without rales or wheezing.                Abd:  Soft, NT, ND, + BS, no organomegaly               Neurological: Pt is alert. At baseline orientation, motor grossly intact               Skin: Skin is warm. No rashes, no other new lesions, LE edema - none  Psychiatric: Pt behavior is normal without agitation   Micro: none  Cardiac tracings I have personally interpreted today:  none  Pertinent  Radiological findings (summarize): none   Lab Results  Component Value Date   WBC 6.2 07/27/2023   HGB 12.7 (L) 07/27/2023   HCT 37.7 (L) 07/27/2023   PLT 134.0 (L) 07/27/2023   GLUCOSE 80 07/27/2023   CHOL 90 07/27/2023   TRIG 139.0 07/27/2023   HDL 56.80 07/27/2023   LDLDIRECT 146.0 06/03/2022   LDLCALC 6 07/27/2023   ALT 18 07/27/2023   AST 21 07/27/2023   NA 141 07/27/2023   K 4.9 07/27/2023   CL 103 07/27/2023   CREATININE 1.83 (H) 07/27/2023   BUN 25 (H) 07/27/2023   CO2 31 07/27/2023   TSH 0.89 07/27/2023   PSA 3.06 06/03/2022   INR 1.3 (H) 11/22/2022   HGBA1C 6.2 07/27/2023   MICROALBUR <0.7 07/27/2023   Assessment/Plan:  Maddux Vanscyoc is a 78 y.o. White or Caucasian [1] male with  has a past medical history of Allergy, Anginal pain (HCC), Anxiety, CHF (congestive heart failure) (HCC), Coronary artery disease, Depression, Diabetes mellitus without complication (HCC), GERD (gastroesophageal reflux disease), Hepatitis C, HLD (hyperlipidemia) (08/09/2017), Hypertension, Hypoglycemia, Pneumonia (06/2022), and Stroke (HCC) (03/2021).  Encounter for well adult exam with abnormal findings Age and sex appropriate education and counseling updated with regular exercise and diet Referrals for preventative services  - for colonoscopy next month Immunizations addressed - none needed Smoking counseling  - none needed Evidence for depression or other mood disorder - none significant Most recent labs reviewed. I have personally reviewed and have noted: 1) the patient's medical and social history 2) The patient's current medications and supplements 3) The patient's height, weight, and BMI have been recorded in the chart   DM2 (diabetes mellitus, type 2) (HCC) Lab Results  Component Value Date   HGBA1C 6.2 07/27/2023   Stable, pt to continue current medical treatment  - diet, wt conttol   Erectile dysfunction With recent worsening, for cialis 20 mg prn  Essential  hypertension BP Readings from Last 3 Encounters:  07/27/23 130/82  07/20/23 137/76  06/02/23 (!) 120/58   Stable, pt to continue medical treatment hydralazine 25 tid, losartan 100 every day, lopressor 25 bid   Hyperlipidemia LDL goal <70 Lab Results  Component Value Date   LDLCALC 6 07/27/2023   Stable, pt to continue current statin repatha 140 mg, lipitor 80 qd    Stage 3a chronic kidney disease (CKD) (HCC) Lab Results  Component Value Date   CREATININE 1.83 (H) 07/27/2023   Stable overall, cont to avoid nephrotoxins   Vitamin D deficiency Last vitamin D Lab Results  Component Value Date   VD25OH 73.37 07/27/2023   Stable, cont oral replacement  Followup: Return in about 6 months (around 01/27/2024).  Oliver Barre, MD 07/29/2023 10:57 PM [facility] Internal Medicine

## 2023-07-29 ENCOUNTER — Encounter: Payer: Self-pay | Admitting: Internal Medicine

## 2023-07-29 NOTE — Assessment & Plan Note (Signed)
 Age and sex appropriate education and counseling updated with regular exercise and diet Referrals for preventative services  - for colonoscopy next month Immunizations addressed - none needed Smoking counseling  - none needed Evidence for depression or other mood disorder - none significant Most recent labs reviewed. I have personally reviewed and have noted: 1) the patient's medical and social history 2) The patient's current medications and supplements 3) The patient's height, weight, and BMI have been recorded in the chart

## 2023-07-29 NOTE — Assessment & Plan Note (Signed)
 Last vitamin D Lab Results  Component Value Date   VD25OH 73.37 07/27/2023   Stable, cont oral replacement

## 2023-07-29 NOTE — Assessment & Plan Note (Signed)
 Lab Results  Component Value Date   HGBA1C 6.2 07/27/2023   Stable, pt to continue current medical treatment  - diet, wt conttol

## 2023-07-29 NOTE — Assessment & Plan Note (Signed)
 BP Readings from Last 3 Encounters:  07/27/23 130/82  07/20/23 137/76  06/02/23 (!) 120/58   Stable, pt to continue medical treatment hydralazine 25 tid, losartan 100 every day, lopressor 25 bid

## 2023-07-29 NOTE — Assessment & Plan Note (Signed)
 With recent worsening, for cialis 20 mg prn

## 2023-07-29 NOTE — Assessment & Plan Note (Signed)
 Lab Results  Component Value Date   LDLCALC 6 07/27/2023   Stable, pt to continue current statin repatha 140 mg, lipitor 80 qd

## 2023-07-29 NOTE — Assessment & Plan Note (Signed)
 Lab Results  Component Value Date   CREATININE 1.83 (H) 07/27/2023   Stable overall, cont to avoid nephrotoxins

## 2023-07-30 ENCOUNTER — Other Ambulatory Visit: Payer: Self-pay | Admitting: Internal Medicine

## 2023-08-07 ENCOUNTER — Other Ambulatory Visit (HOSPITAL_COMMUNITY): Payer: Self-pay

## 2023-08-15 ENCOUNTER — Other Ambulatory Visit (HOSPITAL_COMMUNITY): Payer: Self-pay

## 2023-08-21 ENCOUNTER — Other Ambulatory Visit (HOSPITAL_COMMUNITY): Payer: Self-pay

## 2023-08-21 ENCOUNTER — Ambulatory Visit (AMBULATORY_SURGERY_CENTER): Admitting: *Deleted

## 2023-08-21 ENCOUNTER — Other Ambulatory Visit: Payer: Self-pay

## 2023-08-21 VITALS — Ht 72.0 in | Wt 182.0 lb

## 2023-08-21 DIAGNOSIS — Z860100 Personal history of colon polyps, unspecified: Secondary | ICD-10-CM

## 2023-08-21 MED ORDER — PEG 3350-KCL-NA BICARB-NACL 420 G PO SOLR
4000.0000 mL | Freq: Once | ORAL | 0 refills | Status: AC
  Filled 2023-08-21: qty 4000, 1d supply, fill #0

## 2023-08-21 NOTE — Progress Notes (Addendum)
 Pt's name and DOB verified at the beginning of the pre-visit wit 2 identifiers   Pt denies any difficulty with ambulating,sitting, laying down or rolling side to side  Pt has no issues with ambulation   Pt has no issues moving head neck or swallowing  No egg or soy allergy known to patient   No issues known to pt with past sedation with any surgeries or procedures  Pt denies having issues being intubatedNo FH of Malignant Hyperthermia  Pt is not on diet pills or shots  Pt is not on home 02   Pt is not on blood thinners   Pt denies issues with constipation   Pt is not on dialysis  Pt states he has had hx  of Tachycardia  Pt denies any upcoming cardiac testing  Chart not reviewed by CRNA prior to [facility]  Visit by phone  Pt states weight is 182 lb  Pt was not previewed by CRNA prior to PV   Instructed to hold blood thinner for Plavix for 5 days

## 2023-08-23 ENCOUNTER — Other Ambulatory Visit (HOSPITAL_COMMUNITY): Payer: Self-pay

## 2023-08-24 ENCOUNTER — Telehealth: Payer: Self-pay | Admitting: Gastroenterology

## 2023-08-24 DIAGNOSIS — Z860100 Personal history of colon polyps, unspecified: Secondary | ICD-10-CM

## 2023-08-24 NOTE — Telephone Encounter (Signed)
 Returned patients call regarding his [place] prep. No answer. Left a message stating that we would follow up tomorrow.   Janalee Dane, LPN ( PV )

## 2023-08-24 NOTE — Telephone Encounter (Signed)
 Patient is scheduled for a colonoscopy procedure for April the 30 th. Patient would like to know if his insurance will be covered completely by his insurance. Patient is requesting a call back. Please advise.

## 2023-08-24 NOTE — Telephone Encounter (Signed)
 Patient called and stated that he has not received his prep medication for his procedure schedule for April the 30 th. Patient is requesting a call back.Please advise.

## 2023-08-25 ENCOUNTER — Other Ambulatory Visit (HOSPITAL_COMMUNITY): Payer: Self-pay

## 2023-08-25 ENCOUNTER — Other Ambulatory Visit (HOSPITAL_BASED_OUTPATIENT_CLINIC_OR_DEPARTMENT_OTHER): Payer: Self-pay

## 2023-08-25 MED ORDER — PEG 3350-KCL-NA BICARB-NACL 420 G PO SOLR
4000.0000 mL | Freq: Once | ORAL | 0 refills | Status: AC
  Filled 2023-08-25: qty 4000, 1d supply, fill #0

## 2023-08-25 NOTE — Telephone Encounter (Signed)
 Pt informed that prescription for prep med has been reordered and is in the computer at the pharmacy listed in the profile. Pt confirmed pharmacy. Questions regarding Miralax and Doculax discussed and clarified for pt. Pt states he has not gotten his instructions. RN offered to [facility] copy he can pick up and send another one via the mail. Pt states he does not drive. RN to send a new copy via mail. Reviewed with pt when to take Ducolax and when to take Miralax. Pt had no other questions after discussion and RN instructed pt to call if he has any other questions or concerns.

## 2023-08-31 ENCOUNTER — Other Ambulatory Visit (HOSPITAL_COMMUNITY): Payer: Self-pay

## 2023-09-04 ENCOUNTER — Encounter: Admitting: Gastroenterology

## 2023-09-07 ENCOUNTER — Encounter: Payer: Self-pay | Admitting: Gastroenterology

## 2023-09-13 ENCOUNTER — Other Ambulatory Visit: Payer: Self-pay

## 2023-09-13 ENCOUNTER — Other Ambulatory Visit: Payer: Self-pay | Admitting: Cardiovascular Disease

## 2023-09-13 ENCOUNTER — Ambulatory Visit: Admitting: Gastroenterology

## 2023-09-13 ENCOUNTER — Other Ambulatory Visit (HOSPITAL_COMMUNITY): Payer: Self-pay

## 2023-09-13 ENCOUNTER — Other Ambulatory Visit: Payer: Self-pay | Admitting: Internal Medicine

## 2023-09-13 ENCOUNTER — Encounter: Payer: Self-pay | Admitting: Gastroenterology

## 2023-09-13 VITALS — BP 141/80 | HR 68 | Temp 98.4°F | Resp 11 | Ht 72.0 in | Wt 182.0 lb

## 2023-09-13 DIAGNOSIS — I214 Non-ST elevation (NSTEMI) myocardial infarction: Secondary | ICD-10-CM

## 2023-09-13 DIAGNOSIS — I6300 Cerebral infarction due to thrombosis of unspecified precerebral artery: Secondary | ICD-10-CM

## 2023-09-13 DIAGNOSIS — Z860100 Personal history of colon polyps, unspecified: Secondary | ICD-10-CM

## 2023-09-13 DIAGNOSIS — E785 Hyperlipidemia, unspecified: Secondary | ICD-10-CM

## 2023-09-13 DIAGNOSIS — Z1211 Encounter for screening for malignant neoplasm of colon: Secondary | ICD-10-CM | POA: Diagnosis not present

## 2023-09-13 DIAGNOSIS — D122 Benign neoplasm of ascending colon: Secondary | ICD-10-CM

## 2023-09-13 MED ORDER — REPATHA SURECLICK 140 MG/ML ~~LOC~~ SOAJ
1.0000 mL | SUBCUTANEOUS | 3 refills | Status: AC
  Filled 2023-09-13: qty 6, 84d supply, fill #0
  Filled 2023-12-06: qty 6, 84d supply, fill #1
  Filled 2024-02-28: qty 6, 84d supply, fill #2
  Filled 2024-05-08: qty 6, 84d supply, fill #3

## 2023-09-13 MED ORDER — SODIUM CHLORIDE 0.9 % IV SOLN: 500.0000 mL | Freq: Once | INTRAVENOUS | Status: DC

## 2023-09-13 NOTE — Progress Notes (Signed)
 Pt's states no medical or surgical changes since previsit or office visit.

## 2023-09-13 NOTE — Op Note (Signed)
 [facility] Patient Name: Collin Bridges Procedure Date: 09/13/2023 1:37 PM MRN: [id number] Endoscopist: Geralyn Knee E. Cherryl Corona , MD, [id number] Age: 78 Referring MD:  Date of Birth: August 20, 1945 Gender: Male Account #: [id number] Procedure:                Colonoscopy Indications:              High risk colon cancer surveillance: Personal                            history of adenoma (10 mm or greater in size) Medicines:                Monitored Anesthesia Care Procedure:                Pre-Anesthesia Assessment:                           - Prior to the procedure, a History and Physical                            was performed, and patient medications and                            allergies were reviewed. The patient's tolerance of                            previous anesthesia was also reviewed. The risks                            and benefits of the procedure and the sedation                            options and risks were discussed with the patient.                            All questions were answered, and informed consent                            was obtained. Prior Anticoagulants: The patient has                            taken Plavix  (clopidogrel ), last dose was 5 days                            prior to procedure. ASA Grade Assessment: III - A                            patient with severe systemic disease. After                            reviewing the risks and benefits, the patient was                            deemed in satisfactory condition to undergo the  procedure.                           After obtaining informed consent, the colonoscope                            was passed under direct vision. Throughout the                            procedure, the patient's blood pressure, pulse, and                            oxygen saturations were monitored continuously. The                            CF [id number] #[id number] was introduced  through the anus                            and advanced to the the terminal ileum, with                            identification of the appendiceal orifice and IC                            valve. The colonoscopy was performed without                            difficulty. The patient tolerated the procedure                            well. The quality of the bowel preparation was                            adequate. The terminal ileum, ileocecal valve,                            appendiceal orifice, and rectum were photographed. Scope In: 1:43:11 PM Scope Out: 2:09:06 PM Scope Withdrawal Time: 0 hours 21 minutes 30 seconds  Total Procedure Duration: 0 hours 25 minutes 55 seconds  Findings:                 The perianal and digital rectal examinations were                            normal. Pertinent negatives include normal                            sphincter tone and no palpable rectal lesions.                           A tattoo was seen in the ascending colon.                           A 15 mm polyp was found in the ascending colon just  proximal to the tattoo. The polyp was sessile and                            consistent with remnant/recurrent polyp from 2023.                            The polyp was removed with a piecemeal technique                            using a cold snare. Coagulation for destruction of                            borders of the polypectomy site using snare tip was                            successful. Resection and retrieval were complete.                           The exam was otherwise normal throughout the                            examined colon.                           The terminal ileum appeared normal.                           The retroflexed view of the distal rectum and anal                            verge was normal and showed no anal or rectal                            abnormalities. Complications:            No  immediate complications. Estimated Blood Loss:     Estimated blood loss was minimal. Impression:               - A tattoo was seen in the ascending colon.                           - One 15 mm polyp in the ascending colon, removed                            piecemeal using a cold snare. Resected and                            retrieved. Treated with a hot snare.                           - The examined portion of the ileum was normal.                           - The distal rectum and anal verge are normal on  retroflexion view. Recommendation:           - Patient has a contact number available for                            emergencies. The signs and symptoms of potential                            delayed complications were discussed with the                            patient. Return to normal activities tomorrow.                            Written discharge instructions were provided to the                            patient.                           - Resume previous diet.                           - Resume Plavix  (clopidogrel ) at prior dose                            tomorrow.                           - Await pathology results.                           - Consider repeat colonoscopy in 1 year for                            surveillance after piecemeal polypectomy. Yides Saidi E. Cherryl Corona, MD 09/13/2023 2:17:54 PM This report has been signed electronically.

## 2023-09-13 NOTE — Progress Notes (Signed)
 Called to room to assist during endoscopic procedure.  Patient ID and intended procedure confirmed with present staff. Received instructions for my participation in the procedure from the performing physician.

## 2023-09-13 NOTE — Progress Notes (Signed)
 Sedate, gd SR, tolerated procedure well, VSS, report to RN

## 2023-09-13 NOTE — Progress Notes (Signed)
 [facility] History and Physical   Primary Care Physician:  Roslyn Coombe, MD   Reason for Procedure:  History of colon polyps  Plan:    Colonoscopy     HPI: Collin Bridges is a 78 y.o. male undergoing repeat colonoscopy.  He underwent a colonoscopy in Oct 2023 for screening and was found to have a 25 mm tubular adenoma in the ascending colon removed piecemeal and several other smaller polyps.  He had been recommended to repeat in 6-9 months, but subsequently was found to have significant CAD and underwent a CABG in June 2024. A colonoscopy was attempted July 20, 2023, but was limited due to copious solid stool, preventing visualization of the cecum.  Residual polyp was noted at the previous polypectomy site, but the patient did not know if he had taken his Plavix  or not, and the polyp was not removed. Today, he reports his last dose of Plavix  was 09/08/2023.    Past Medical History:  Diagnosis Date   Allergy    seasonal   Anginal pain (HCC)    Anxiety    Brain aneurysm 2022   'watching it' ,HAS NOT HAD SURGERY- dR SETHI   CHF (congestive heart failure) (HCC)    Coronary artery disease    Depression    Diabetes mellitus without complication (HCC)    Type 2   GERD (gastroesophageal reflux disease)    Pt states "take ambien  for this"   Hepatitis C    HLD (hyperlipidemia) 08/09/2017   Hypertension    Hypoglycemia    Pneumonia 06/2022   Stroke (HCC) 03/2021   TIA    Past Surgical History:  Procedure Laterality Date   APPENDECTOMY     BUBBLE STUDY  06/16/2021   Procedure: BUBBLE STUDY;  Surgeon: Harrold Lincoln, MD;  Location: [facility] ENDOSCOPY;  Service: Cardiovascular;;   COLONOSCOPY     CORONARY ARTERY BYPASS GRAFT N/A 11/10/2022   Procedure: CORONARY ARTERY BYPASS GRAFTING (CABG) X5 USING LEFT INTERNAL MAMMARY ARTERY (LIMA) AND ENDOSCOPICALLY HARVESTED RIGHT AND LEFT GREATER SAPHENOUS VEIN;  Surgeon: Melene Sportsman, MD;  Location: [facility] OR;  Service:  Open Heart Surgery;  Laterality: N/A;   CORONARY ARTERY BYPASS GRAFT     5   FRACTURE SURGERY Left    Ankle   LEFT HEART CATH AND CORONARY ANGIOGRAPHY N/A 07/12/2022   Procedure: LEFT HEART CATH AND CORONARY ANGIOGRAPHY;  Surgeon: Arleen Lacer, MD;  Location: [facility] INVASIVE CV LAB;  Service: Cardiovascular;  Laterality: N/A;   TEE WITHOUT CARDIOVERSION N/A 06/16/2021   Procedure: TRANSESOPHAGEAL ECHOCARDIOGRAM (TEE);  Surgeon: Harrold Lincoln, MD;  Location: [facility] ENDOSCOPY;  Service: Cardiovascular;  Laterality: N/A;   TEE WITHOUT CARDIOVERSION N/A 11/10/2022   Procedure: TRANSESOPHAGEAL ECHOCARDIOGRAM;  Surgeon: Melene Sportsman, MD;  Location[facility] OR;  Service: Open Heart Surgery;  Laterality: N/A;   TONSILECTOMY, ADENOIDECTOMY, BILATERAL MYRINGOTOMY AND TUBES  1955    Prior to Admission medications   Medication Sig Start Date End Date Taking? Authorizing Provider  aspirin  EC 81 MG tablet Take 1 tablet (81 mg total) by mouth daily. Swallow whole. 03/02/23   O'NealCathay Clonts, MD  atorvastatin  (LIPITOR ) 80 MG tablet Take 1 tablet (80 mg total) by mouth daily. 07/27/23 10/25/23  Roslyn Coombe, MD  cholecalciferol (VITAMIN D3) 25 MCG (1000 UNIT) tablet Take 1 tablet (1,000 Units total) by mouth daily. Take 1,000 Units by mouth daily. 11/22/21   Roslyn Coombe, MD  clopidogrel  (PLAVIX ) 75 MG tablet Take  1 tablet (75 mg total) by mouth daily. 03/02/23   O'NealCathay Clonts, MD  Evolocumab  (REPATHA  SURECLICK) 140 MG/ML SOAJ Inject 140 mg into the skin every 14 days. 09/13/23   O'NealCathay Clonts, MD  fenofibrate  160 MG tablet Take 1 tablet (160 mg total) by mouth daily. 07/27/23   Roslyn Coombe, MD  fluticasone  (FLONASE ) 50 MCG/ACT nasal spray Place 2 sprays into both nostrils daily. 11/09/22   Roslyn Coombe, MD  furosemide  (LASIX ) 20 MG tablet Take 1 tablet (20 mg total) by mouth daily. 03/02/23 12/04/23  O'NealCathay Clonts, MD  gabapentin  (NEURONTIN ) 300 MG capsule Take 1 capsule (300 mg  total) by mouth at bedtime. 07/27/23   Roslyn Coombe, MD  losartan  (COZAAR ) 100 MG tablet Take 1 tablet (100 mg total) by mouth daily. 07/27/23   Roslyn Coombe, MD  magnesium  oxide (MAG-OX) 400 (240 Mg) MG tablet Take 400 mg by mouth daily.    [provider]  metoprolol  tartrate (LOPRESSOR ) 25 MG tablet Take 1 tablet (25 mg total) by mouth 2 (two) times daily. 07/27/23   Roslyn Coombe, MD  nitroGLYCERIN  (NITROSTAT ) 0.4 MG SL tablet Place 1 tablet under the tongue every 5 minutes as needed for chest pain. 10/06/22 01/04/23  Harrold Lincoln, MD  PARoxetine  (PAXIL ) 40 MG tablet Take 1 tablet (40 mg total) by mouth every morning. 07/27/23   Roslyn Coombe, MD  tadalafil  (CIALIS ) 20 MG tablet Take 0.5-1 tablets (10-20 mg total) by mouth every other day as needed for erectile dysfunction. 07/27/23   Roslyn Coombe, MD  vitamin E 180 MG (400 UNITS) capsule Take 400 Units by mouth daily.    [provider]  zolpidem  (AMBIEN ) 10 MG tablet TAKE 1 TABLET(10 MG) BY MOUTH AT BEDTIME AS NEEDED FOR SLEEP 07/27/23   Roslyn Coombe, MD    Current Outpatient Medications  Medication Sig Dispense Refill   aspirin  EC 81 MG tablet Take 1 tablet (81 mg total) by mouth daily. Swallow whole. 30 tablet 12   atorvastatin  (LIPITOR ) 80 MG tablet Take 1 tablet (80 mg total) by mouth daily. 90 tablet 3   cholecalciferol (VITAMIN D3) 25 MCG (1000 UNIT) tablet Take 1 tablet (1,000 Units total) by mouth daily. Take 1,000 Units by mouth daily. 90 tablet 2   clopidogrel  (PLAVIX ) 75 MG tablet Take 1 tablet (75 mg total) by mouth daily. 90 tablet 3   Evolocumab  (REPATHA  SURECLICK) 140 MG/ML SOAJ Inject 140 mg into the skin every 14 days. 6 mL 3   fenofibrate  160 MG tablet Take 1 tablet (160 mg total) by mouth daily. 90 tablet 3   fluticasone  (FLONASE ) 50 MCG/ACT nasal spray Place 2 sprays into both nostrils daily. 48 g 1   furosemide  (LASIX ) 20 MG tablet Take 1 tablet (20 mg total) by mouth daily. 90 tablet 3    gabapentin  (NEURONTIN ) 300 MG capsule Take 1 capsule (300 mg total) by mouth at bedtime. 90 capsule 1   losartan  (COZAAR ) 100 MG tablet Take 1 tablet (100 mg total) by mouth daily. 90 tablet 3   magnesium  oxide (MAG-OX) 400 (240 Mg) MG tablet Take 400 mg by mouth daily.     metoprolol  tartrate (LOPRESSOR ) 25 MG tablet Take 1 tablet (25 mg total) by mouth 2 (two) times daily. 180 tablet 3   nitroGLYCERIN  (NITROSTAT ) 0.4 MG SL tablet Place 1 tablet under the tongue every 5 minutes as needed for chest pain. 25 tablet 3  PARoxetine  (PAXIL ) 40 MG tablet Take 1 tablet (40 mg total) by mouth every morning. 90 tablet 3   tadalafil  (CIALIS ) 20 MG tablet Take 0.5-1 tablets (10-20 mg total) by mouth every other day as needed for erectile dysfunction. 10 tablet 11   vitamin E 180 MG (400 UNITS) capsule Take 400 Units by mouth daily.     zolpidem  (AMBIEN ) 10 MG tablet TAKE 1 TABLET(10 MG) BY MOUTH AT BEDTIME AS NEEDED FOR SLEEP 90 tablet 1   No current facility-administered medications for this visit.    Allergies as of 09/13/2023   (No Known Allergies)    Family History  Problem Relation Age of Onset   Lung disease Mother    Hyperlipidemia Father    Hypertension Father    Prostate cancer Paternal Uncle    Colon polyps Neg Hx    Colon cancer Neg Hx    Esophageal cancer Neg Hx    Rectal cancer Neg Hx    Stomach cancer Neg Hx     Social History   Socioeconomic History   Marital status: Single    Spouse name: Not on file   Number of children: 0   Years of education: some college   Highest education level: Associate degree: occupational, Scientist, product/process development, or vocational program  Occupational History   Occupation: retired  Tobacco Use   Smoking status: Former   Smokeless tobacco: Never   Tobacco comments:    quit 49yrs ago, smoke a cigar occassionally.  Vaping Use   Vaping status: Never Used  Substance and Sexual Activity   Alcohol  use: Not Currently    Comment: Quit 30 years ago   Drug use:  No   Sexual activity: Yes  Other Topics Concern   Not on file  Social History Narrative   Lives alone.   Right-handed.   Two cups caffeine per day.   Social Drivers of Health   Financial Resource Strain: Medium Risk (07/23/2023)   Overall Financial Resource Strain (CARDIA)    Difficulty of Paying Living Expenses: Somewhat hard  Food Insecurity: Food Insecurity Present (07/23/2023)   Hunger Vital Sign    Worried About Running Out of Food in the Last Year: Sometimes true    Ran Out of Food in the Last Year: Sometimes true  Transportation Needs: No Transportation Needs (07/23/2023)   PRAPARE - Administrator, Civil Service (Medical): No    Lack of Transportation (Non-Medical): No  Physical Activity: Insufficiently Active (07/23/2023)   Exercise Vital Sign    Days of Exercise per Week: 3 days    Minutes of Exercise per Session: 30 min  Stress: No Stress Concern Present (07/23/2023)   [employer] of Occupational Health - Occupational Stress Questionnaire    Feeling of Stress : Not at all  Social Connections: Socially Isolated (07/23/2023)   Social Connection and Isolation Panel [NHANES]    Frequency of Communication with Friends and Family: More than three times a week    Frequency of Social Gatherings with Friends and Family: Once a week    Attends Religious Services: Never    Database administrator or Organizations: No    Attends Banker Meetings: Never    Marital Status: Divorced  Catering manager Violence: Unknown (12/21/2022)   Received from [facility]   HITS    Physically Hurt: Not on file    Insult or Talk Down To: Not on file    Threaten Physical Harm: Not on file  Scream or Curse: Not on file    Review of Systems:  All other review of systems negative except as mentioned in the HPI.  Physical Exam: Vital signs BP (!) 153/76   Pulse 71   Temp 98.4 F (36.9 C)   Ht 6' (1.829 m)   Wt 182 lb (82.6 kg)   SpO2 97%   BMI 24.68 kg/m    General:   Alert,  Well-developed, well-nourished, pleasant and cooperative in NAD Airway:  Mallampati 3 Lungs:  Clear throughout to auscultation.   Heart:  Regular rate and rhythm; no murmurs, clicks, rubs,  or gallops. Abdomen:  Soft, nontender and nondistended. Normal bowel sounds.   Neuro/Psych:  Normal mood and affect. A and O x 3   Iyan Flett E. Cherryl Corona, MD [facility]

## 2023-09-13 NOTE — Patient Instructions (Signed)
 Educational handout provided to patient related to Polyps  Resume previous diet- RESUME PLAVIX  AT PRIOR DOSE TOMORROW  Continue present medications  Awaiting pathology results  YOU HAD AN ENDOSCOPIC PROCEDURE TODAY AT THE [facility]:   Refer to the procedure report that was given to you for any specific questions about what was found during the examination.  If the procedure report does not answer your questions, please call your gastroenterologist to clarify.  If you requested that your care partner not be given the details of your procedure findings, then the procedure report has been included in a sealed envelope for you to review at your convenience later.  YOU SHOULD EXPECT: Some feelings of bloating in the abdomen. Passage of more gas than usual.  Walking can help get rid of the air that was put into your GI tract during the procedure and reduce the bloating. If you had a lower endoscopy (such as a colonoscopy or flexible sigmoidoscopy) you may notice spotting of blood in your stool or on the toilet paper. If you underwent a bowel prep for your procedure, you may not have a normal bowel movement for a few days.  Please Note:  You might notice some irritation and congestion in your nose or some drainage.  This is from the oxygen used during your procedure.  There is no need for concern and it should clear up in a day or so.  SYMPTOMS TO REPORT IMMEDIATELY:  Following lower endoscopy (colonoscopy or flexible sigmoidoscopy):  Excessive amounts of blood in the stool  Significant tenderness or worsening of abdominal pains  Swelling of the abdomen that is new, acute  Fever of 100F or higher  For urgent or emergent issues, a gastroenterologist can be reached at any hour by calling [phone]. Do not use MyChart messaging for urgent concerns.    DIET:  We do recommend a small meal at first, but then you may proceed to your regular diet.  Drink plenty of fluids but you  should avoid alcoholic beverages for 24 hours.  ACTIVITY:  You should plan to take it easy for the rest of today and you should NOT DRIVE or use heavy machinery until tomorrow (because of the sedation medicines used during the test).    FOLLOW UP: Our staff will call the number listed on your records the next business day following your procedure.  We will call around 7:15- 8:00 am to check on you and address any questions or concerns that you may have regarding the information given to you following your procedure. If we do not reach you, we will leave a message.     If any biopsies were taken you will be contacted by phone or by letter within the next 1-3 weeks.  Please call us  at [phone] if you have not heard about the biopsies in 3 weeks.    SIGNATURES/CONFIDENTIALITY: You and/or your care partner have signed paperwork which will be entered into your electronic medical record.  These signatures attest to the fact that that the information above on your After Visit Summary has been reviewed and is understood.  Full responsibility of the confidentiality of this discharge information lies with you and/or your care-partner.

## 2023-09-14 ENCOUNTER — Telehealth: Payer: Self-pay | Admitting: *Deleted

## 2023-09-14 NOTE — Telephone Encounter (Signed)
 No answer for post procedure follow up left vm.

## 2023-09-16 ENCOUNTER — Other Ambulatory Visit (HOSPITAL_COMMUNITY): Payer: Self-pay

## 2023-09-16 ENCOUNTER — Other Ambulatory Visit: Payer: Self-pay | Admitting: Internal Medicine

## 2023-09-18 LAB — SURGICAL PATHOLOGY

## 2023-09-20 ENCOUNTER — Other Ambulatory Visit (HOSPITAL_COMMUNITY): Payer: Self-pay

## 2023-09-20 ENCOUNTER — Telehealth: Payer: Self-pay | Admitting: Internal Medicine

## 2023-09-20 NOTE — Telephone Encounter (Signed)
 [facility] called and spoke to [name], [facility] about the refill(s) gabapentin  requested. Advised it was sent to [facility] on 07/27/23 #90/1 refill(s). He says he will call and get the prescription.

## 2023-09-20 NOTE — Telephone Encounter (Unsigned)
 Copied from CRM [id number]. Topic: Clinical - Medication Refill >> Sep 20, 2023  1:39 PM Marlan Silva wrote: Medication: gabapentin  (NEURONTIN ) 300 MG capsule  Has the patient contacted their pharmacy? Yes (Agent: If no, request that the patient contact the pharmacy for the refill. If patient does not wish to contact the pharmacy document the reason why and proceed with request.) (Agent: If yes, when and what did the pharmacy advise?)  This is the patient's preferred pharmacy:  [facility] [address] [place] [address] Phone: [phone] Fax: [phone]   Is this the correct pharmacy for this prescription? Yes If no, delete pharmacy and type the correct one.   Has the prescription been filled recently? No  Is the patient out of the medication? No  Has the patient been seen for an appointment in the last year OR does the patient have an upcoming appointment? Yes  Can we respond through MyChart? Yes  Agent: Please be advised that Rx refills may take up to 3 business days. We ask that you follow-up with your pharmacy.

## 2023-09-22 ENCOUNTER — Encounter: Payer: Self-pay | Admitting: Gastroenterology

## 2023-09-22 NOTE — Progress Notes (Signed)
 Mr. Willits,  The polyp removed was a tubular adenoma which is a common precancerous polyp in the colon.  No concerning features such as high grade dysplasia were seen.  As this polyp was the remnant or recurrence of the large polyp previously removed in 2023, it is at higher risk for recurrence.  As discussed, I think a repeat colonoscopy in 1-2 years to re-examine this area one last time would be reasonable.

## 2023-09-23 ENCOUNTER — Other Ambulatory Visit (HOSPITAL_COMMUNITY): Payer: Self-pay

## 2023-09-23 ENCOUNTER — Other Ambulatory Visit: Payer: Self-pay | Admitting: Internal Medicine

## 2023-09-25 ENCOUNTER — Other Ambulatory Visit: Payer: Self-pay

## 2023-09-25 ENCOUNTER — Other Ambulatory Visit (HOSPITAL_COMMUNITY): Payer: Self-pay

## 2023-09-25 MED ORDER — ATORVASTATIN CALCIUM 80 MG PO TABS
80.0000 mg | ORAL_TABLET | Freq: Every day | ORAL | 3 refills | Status: DC
  Filled 2023-09-25: qty 90, 90d supply, fill #0
  Filled 2023-12-25: qty 90, 90d supply, fill #1

## 2023-10-05 ENCOUNTER — Other Ambulatory Visit: Payer: Self-pay

## 2023-11-06 ENCOUNTER — Other Ambulatory Visit (HOSPITAL_COMMUNITY): Payer: Self-pay

## 2023-11-06 ENCOUNTER — Other Ambulatory Visit: Payer: Self-pay

## 2023-11-06 ENCOUNTER — Other Ambulatory Visit: Payer: Self-pay | Admitting: Internal Medicine

## 2023-11-06 MED ORDER — FENOFIBRATE 160 MG PO TABS
160.0000 mg | ORAL_TABLET | Freq: Every day | ORAL | 1 refills | Status: DC
  Filled 2023-11-06: qty 90, 90d supply, fill #0
  Filled 2024-01-16: qty 90, 90d supply, fill #1

## 2023-11-15 ENCOUNTER — Other Ambulatory Visit (HOSPITAL_COMMUNITY): Payer: Self-pay

## 2023-12-06 ENCOUNTER — Other Ambulatory Visit: Payer: Self-pay

## 2023-12-11 ENCOUNTER — Other Ambulatory Visit (HOSPITAL_COMMUNITY): Payer: Self-pay

## 2023-12-13 ENCOUNTER — Other Ambulatory Visit: Payer: Self-pay | Admitting: Cardiovascular Disease

## 2023-12-13 ENCOUNTER — Other Ambulatory Visit (HOSPITAL_COMMUNITY): Payer: Self-pay

## 2023-12-15 ENCOUNTER — Other Ambulatory Visit: Payer: Self-pay

## 2023-12-15 MED ORDER — METOPROLOL TARTRATE 25 MG PO TABS
25.0000 mg | ORAL_TABLET | Freq: Two times a day (BID) | ORAL | 1 refills | Status: DC
  Filled 2023-12-15: qty 180, 90d supply, fill #0

## 2023-12-21 ENCOUNTER — Other Ambulatory Visit: Payer: Self-pay

## 2023-12-21 ENCOUNTER — Other Ambulatory Visit (HOSPITAL_COMMUNITY): Payer: Self-pay

## 2023-12-25 ENCOUNTER — Other Ambulatory Visit (HOSPITAL_COMMUNITY): Payer: Self-pay

## 2024-01-16 ENCOUNTER — Other Ambulatory Visit (HOSPITAL_COMMUNITY): Payer: Self-pay

## 2024-01-29 ENCOUNTER — Other Ambulatory Visit: Payer: Self-pay | Admitting: Internal Medicine

## 2024-01-29 ENCOUNTER — Ambulatory Visit: Admitting: Internal Medicine

## 2024-01-29 ENCOUNTER — Other Ambulatory Visit (HOSPITAL_COMMUNITY): Payer: Self-pay

## 2024-01-30 ENCOUNTER — Other Ambulatory Visit (HOSPITAL_COMMUNITY): Payer: Self-pay

## 2024-02-01 ENCOUNTER — Other Ambulatory Visit (HOSPITAL_COMMUNITY): Payer: Self-pay

## 2024-02-01 ENCOUNTER — Ambulatory Visit (INDEPENDENT_AMBULATORY_CARE_PROVIDER_SITE_OTHER): Admitting: Internal Medicine

## 2024-02-01 ENCOUNTER — Ambulatory Visit: Payer: Self-pay | Admitting: Internal Medicine

## 2024-02-01 ENCOUNTER — Encounter: Payer: Self-pay | Admitting: Internal Medicine

## 2024-02-01 VITALS — BP 128/82 | HR 67 | Temp 97.9°F | Ht 72.0 in | Wt 187.6 lb

## 2024-02-01 DIAGNOSIS — E1122 Type 2 diabetes mellitus with diabetic chronic kidney disease: Secondary | ICD-10-CM

## 2024-02-01 DIAGNOSIS — E1165 Type 2 diabetes mellitus with hyperglycemia: Secondary | ICD-10-CM | POA: Diagnosis not present

## 2024-02-01 DIAGNOSIS — I5032 Chronic diastolic (congestive) heart failure: Secondary | ICD-10-CM

## 2024-02-01 DIAGNOSIS — E785 Hyperlipidemia, unspecified: Secondary | ICD-10-CM | POA: Diagnosis not present

## 2024-02-01 DIAGNOSIS — I10 Essential (primary) hypertension: Secondary | ICD-10-CM

## 2024-02-01 DIAGNOSIS — E559 Vitamin D deficiency, unspecified: Secondary | ICD-10-CM

## 2024-02-01 DIAGNOSIS — N1831 Chronic kidney disease, stage 3a: Secondary | ICD-10-CM

## 2024-02-01 LAB — BASIC METABOLIC PANEL WITH GFR
BUN: 18 mg/dL (ref 6–23)
CO2: 31 meq/L (ref 19–32)
Calcium: 9.9 mg/dL (ref 8.4–10.5)
Chloride: 103 meq/L (ref 96–112)
Creatinine, Ser: 1.65 mg/dL — ABNORMAL HIGH (ref 0.40–1.50)
GFR: 39.42 mL/min — ABNORMAL LOW (ref >60.00)
Glucose, Bld: 100 mg/dL — ABNORMAL HIGH (ref 70–99)
Potassium: 4.8 meq/L (ref 3.5–5.1)
Sodium: 140 meq/L (ref 135–145)

## 2024-02-01 LAB — LIPID PANEL
Cholesterol: 87 mg/dL (ref 0–200)
HDL: 51 mg/dL (ref >39.00)
LDL Cholesterol: 13 mg/dL (ref 0–99)
NonHDL: 35.94
Total CHOL/HDL Ratio: 2
Triglycerides: 117 mg/dL (ref 0.0–149.0)
VLDL: 23.4 mg/dL (ref 0.0–40.0)

## 2024-02-01 LAB — CBC WITH DIFFERENTIAL/PLATELET
Basophils Absolute: 0 K/uL (ref 0.0–0.1)
Basophils Relative: 0.4 % (ref 0.0–3.0)
Eosinophils Absolute: 0.2 K/uL (ref 0.0–0.7)
Eosinophils Relative: 2.4 % (ref 0.0–5.0)
HCT: 40.5 % (ref 39.0–52.0)
Hemoglobin: 13.7 g/dL (ref 13.0–17.0)
Lymphocytes Relative: 15.5 % (ref 12.0–46.0)
Lymphs Abs: 1.2 K/uL (ref 0.7–4.0)
MCHC: 33.7 g/dL (ref 30.0–36.0)
MCV: 93.3 fl (ref 78.0–100.0)
Monocytes Absolute: 0.7 K/uL (ref 0.1–1.0)
Monocytes Relative: 9.5 % (ref 3.0–12.0)
Neutro Abs: 5.5 K/uL (ref 1.4–7.7)
Neutrophils Relative %: 72.2 % (ref 43.0–77.0)
Platelets: 156 K/uL (ref 150.0–400.0)
RBC: 4.34 Mil/uL (ref 4.22–5.81)
RDW: 13.7 % (ref 11.5–15.5)
WBC: 7.6 K/uL (ref 4.0–10.5)

## 2024-02-01 LAB — HEPATIC FUNCTION PANEL
ALT: 20 U/L (ref 0–53)
AST: 30 U/L (ref 0–37)
Albumin: 4.7 g/dL (ref 3.5–5.2)
Alkaline Phosphatase: 69 U/L (ref 39–117)
Bilirubin, Direct: 0.2 mg/dL (ref 0.0–0.3)
Total Bilirubin: 0.8 mg/dL (ref 0.2–1.2)
Total Protein: 7.2 g/dL (ref 6.0–8.3)

## 2024-02-01 LAB — HEMOGLOBIN A1C: Hgb A1c MFr Bld: 6.6 % — ABNORMAL HIGH (ref 4.6–6.5)

## 2024-02-01 LAB — TSH: TSH: 1.15 u[IU]/mL (ref 0.35–5.50)

## 2024-02-01 NOTE — Patient Instructions (Signed)
You had the flu shot today  Please continue all other medications as before, and refills have been done if requested.  Please have the pharmacy call with any other refills you may need.  Please continue your efforts at being more active, low cholesterol diet, and weight control  Please keep your appointments with your specialists as you may have planned  Please go to the LAB at the blood drawing area for the tests to be done  You will be contacted by phone if any changes need to be made immediately.  Otherwise, you will receive a letter about your results with an explanation, but please check with MyChart first.  Please make an Appointment to return in 6 months, or sooner if needed

## 2024-02-01 NOTE — Progress Notes (Signed)
 Patient ID: Collin Bridges, male   DOB: [birth year], 78 y.o.   MRN: [id number]        Chief Complaint: follow up HTN, HLD , DM , ckd3a, low vit d, chf,        HPI:  Collin Bridges is a 78 y.o. male here overall doing ok, Pt denies chest pain, increased sob or doe, wheezing, orthopnea, PND, increased LE swelling, palpitations, dizziness or syncope. Though did Gained 5 lbs from last visit. Due for flu shot   Pt denies polydipsia, polyuria, or new focal neuro s/s.    Pt denies fever, wt loss, night sweats, loss of appetite, or other constitutional symptoms   Wt Readings from Last 3 Encounters:  02/01/24 187 lb 9.6 oz (85.1 kg)  09/13/23 182 lb (82.6 kg)  08/21/23 182 lb (82.6 kg)   BP Readings from Last 3 Encounters:  02/01/24 128/82  09/13/23 (!) 141/80  07/27/23 130/82         Past Medical History:  Diagnosis Date   Allergy    seasonal   Anginal pain (HCC)    Anxiety    Brain aneurysm 2022   'watching it' ,HAS NOT HAD SURGERY- dR SETHI   CHF (congestive heart failure) (HCC)    Coronary artery disease    Depression    Diabetes mellitus without complication (HCC)    Type 2   GERD (gastroesophageal reflux disease)    Pt states take ambien  for this   Hepatitis C    HLD (hyperlipidemia) 08/09/2017   Hypertension    Hypoglycemia    Pneumonia 06/2022   Stroke (HCC) 03/2021   TIA   Past Surgical History:  Procedure Laterality Date   APPENDECTOMY     BUBBLE STUDY  06/16/2021   Procedure: BUBBLE STUDY;  Surgeon: Barbaraann Darryle Ned, MD;  Location: [facility] ENDOSCOPY;  Service: Cardiovascular;;   COLONOSCOPY     CORONARY ARTERY BYPASS GRAFT N/A 11/10/2022   Procedure: CORONARY ARTERY BYPASS GRAFTING (CABG) X5 USING LEFT INTERNAL MAMMARY ARTERY (LIMA) AND ENDOSCOPICALLY HARVESTED RIGHT AND LEFT GREATER SAPHENOUS VEIN;  Surgeon: Maryjane Mt, MD;  Location: [facility] OR;  Service: Open Heart Surgery;  Laterality: N/A;   CORONARY ARTERY BYPASS GRAFT     5   FRACTURE SURGERY  Left    Ankle   LEFT HEART CATH AND CORONARY ANGIOGRAPHY N/A 07/12/2022   Procedure: LEFT HEART CATH AND CORONARY ANGIOGRAPHY;  Surgeon: Anner Alm ORN, MD;  Location: [facility] INVASIVE CV LAB;  Service: Cardiovascular;  Laterality: N/A;   TEE WITHOUT CARDIOVERSION N/A 06/16/2021   Procedure: TRANSESOPHAGEAL ECHOCARDIOGRAM (TEE);  Surgeon: Barbaraann Darryle Ned, MD;  Location: [facility] ENDOSCOPY;  Service: Cardiovascular;  Laterality: N/A;   TEE WITHOUT CARDIOVERSION N/A 11/10/2022   Procedure: TRANSESOPHAGEAL ECHOCARDIOGRAM;  Surgeon: Maryjane Mt, MD;  Location: [facility] OR;  Service: Open Heart Surgery;  Laterality: N/A;   TONSILECTOMY, ADENOIDECTOMY, BILATERAL MYRINGOTOMY AND TUBES  1955    reports that he has quit smoking. He has never used smokeless tobacco. He reports that he does not currently use alcohol . He reports that he does not use drugs. family history includes Hyperlipidemia in his father; Hypertension in his father; Lung disease in his mother; Prostate cancer in his paternal uncle. No Known Allergies Current Outpatient Medications on File Prior to Visit  Medication Sig Dispense Refill   aspirin  EC 81 MG tablet Take 1 tablet (81 mg total) by mouth daily. Swallow whole. 30 tablet 12   cholecalciferol (VITAMIN D3) 25 MCG (1000  UNIT) tablet Take 1 tablet (1,000 Units total) by mouth daily. Take 1,000 Units by mouth daily. 90 tablet 2   clopidogrel  (PLAVIX ) 75 MG tablet Take 1 tablet (75 mg total) by mouth daily. 90 tablet 3   Evolocumab  (REPATHA  SURECLICK) 140 MG/ML SOAJ Inject 140 mg into the skin every 14 days. 6 mL 3   fluticasone  (FLONASE ) 50 MCG/ACT nasal spray Place 2 sprays into both nostrils daily. 48 g 1   furosemide  (LASIX ) 20 MG tablet Take 1 tablet (20 mg total) by mouth daily. 90 tablet 3   gabapentin  (NEURONTIN ) 300 MG capsule Take 1 capsule (300 mg total) by mouth at bedtime. 90 capsule 1   magnesium  oxide (MAG-OX) 400 (240 Mg) MG tablet Take 400 mg by mouth daily.     metoprolol   tartrate (LOPRESSOR ) 25 MG tablet Take 1 tablet (25 mg total) by mouth 2 (two) times daily. 180 tablet 3   PARoxetine  (PAXIL ) 40 MG tablet Take 1 tablet (40 mg total) by mouth every morning. 90 tablet 3   tadalafil  (CIALIS ) 20 MG tablet Take 0.5-1 tablets (10-20 mg total) by mouth every other day as needed for erectile dysfunction. 10 tablet 11   vitamin E 180 MG (400 UNITS) capsule Take 400 Units by mouth daily.     zolpidem  (AMBIEN ) 10 MG tablet TAKE 1 TABLET(10 MG) BY MOUTH AT BEDTIME AS NEEDED FOR SLEEP 90 tablet 1   No current facility-administered medications on file prior to visit.        ROS:  All others reviewed and negative.  Objective        PE:  BP 128/82   Pulse 67   Temp 97.9 F (36.6 C)   Ht 6' (1.829 m)   Wt 187 lb 9.6 oz (85.1 kg)   SpO2 97%   BMI 25.44 kg/m                 Constitutional: Pt appears in NAD               HENT: Head: NCAT.                Right Ear: External ear normal.                 Left Ear: External ear normal.                Eyes: . Pupils are equal, round, and reactive to light. Conjunctivae and EOM are normal               Nose: without d/c or deformity               Neck: Neck supple. Gross normal ROM               Cardiovascular: Normal rate and regular rhythm.                 Pulmonary/Chest: Effort normal and breath sounds without rales or wheezing.                Abd:  Soft, NT, ND, + BS, no organomegaly               Neurological: Pt is alert. At baseline orientation, motor grossly intact               Skin: Skin is warm. No rashes, no other new lesions, LE edema - none  Psychiatric: Pt behavior is normal without agitation   Micro: none  Cardiac tracings I have personally interpreted today:  none  Pertinent Radiological findings (summarize): none   Lab Results  Component Value Date   WBC 7.6 02/01/2024   HGB 13.7 02/01/2024   HCT 40.5 02/01/2024   PLT 156.0 02/01/2024   GLUCOSE 100 (H) 02/01/2024   CHOL 87  02/01/2024   TRIG 117.0 02/01/2024   HDL 51.00 02/01/2024   LDLDIRECT 146.0 06/03/2022   LDLCALC 13 02/01/2024   ALT 20 02/01/2024   AST 30 02/01/2024   NA 140 02/01/2024   K 4.8 02/01/2024   CL 103 02/01/2024   CREATININE 1.65 (H) 02/01/2024   BUN 18 02/01/2024   CO2 31 02/01/2024   TSH 1.15 02/01/2024   PSA 3.06 06/03/2022   INR 1.3 (H) 11/22/2022   HGBA1C 6.6 (H) 02/01/2024   MICROALBUR <0.7 07/27/2023   Assessment/Plan:  [facility]in [facility] is a 78 y.o. White or Caucasian [1] male with  has a past medical history of Allergy, Anginal pain (HCC), Anxiety, Brain aneurysm (2022), CHF (congestive heart failure) (HCC), Coronary artery disease, Depression, Diabetes mellitus without complication (HCC), GERD (gastroesophageal reflux disease), Hepatitis C, HLD (hyperlipidemia) (08/09/2017), Hypertension, Hypoglycemia, Pneumonia (06/2022), and Stroke (HCC) (03/2021).  Chronic diastolic CHF (congestive heart failure) (HCC) Has gained several lbs but exam stable volume, cont same tx, cont f/u with cardiology as planned  Vitamin D  deficiency Last vitamin D  Lab Results  Component Value Date   VD25OH 73.37 07/27/2023   Stable, cont oral replacement   Stage 3a chronic kidney disease (CKD) (HCC) Lab Results  Component Value Date   CREATININE 1.65 (H) 02/01/2024   Stable overall, cont to avoid nephrotoxins   Hyperlipidemia LDL goal <70 Lab Results  Component Value Date   LDLCALC 13 02/01/2024   Stable, pt to continue current statin repatha  140 mg   Essential hypertension BP Readings from Last 3 Encounters:  02/01/24 128/82  09/13/23 (!) 141/80  07/27/23 130/82   Stable, pt to continue medical treatment lopressor  25 bid   Diabetes mellitus with renal complications (HCC) Lab Results  Component Value Date   HGBA1C 6.6 (H) 02/01/2024   Stable, pt to continue current medical treatment  - diet,w t control  Followup: Return in about 6 months (around 07/31/2024).  Collin Rush, MD 02/04/2024 11:42 AM [facility] Internal Medicine

## 2024-02-01 NOTE — Progress Notes (Signed)
 The test results show that your current treatment is OK, as the tests are stable.  Please continue the same plan.  There is no other need for change of treatment or further evaluation based on these results, at this time.  thanks

## 2024-02-04 ENCOUNTER — Encounter: Payer: Self-pay | Admitting: Internal Medicine

## 2024-02-04 NOTE — Assessment & Plan Note (Signed)
 Lab Results  Component Value Date   CREATININE 1.65 (H) 02/01/2024   Stable overall, cont to avoid nephrotoxins

## 2024-02-04 NOTE — Assessment & Plan Note (Signed)
 Lab Results  Component Value Date   LDLCALC 13 02/01/2024   Stable, pt to continue current statin repatha  140 mg

## 2024-02-04 NOTE — Assessment & Plan Note (Signed)
 Has gained several lbs but exam stable volume, cont same tx, cont f/u with cardiology as planned

## 2024-02-04 NOTE — Assessment & Plan Note (Signed)
 Last vitamin D Lab Results  Component Value Date   VD25OH 73.37 07/27/2023   Stable, cont oral replacement

## 2024-02-04 NOTE — Assessment & Plan Note (Signed)
 BP Readings from Last 3 Encounters:  02/01/24 128/82  09/13/23 (!) 141/80  07/27/23 130/82   Stable, pt to continue medical treatment lopressor  25 bid

## 2024-02-04 NOTE — Assessment & Plan Note (Signed)
 Lab Results  Component Value Date   HGBA1C 6.6 (H) 02/01/2024   Stable, pt to continue current medical treatment  - diet,w t control

## 2024-02-05 ENCOUNTER — Other Ambulatory Visit (HOSPITAL_COMMUNITY): Payer: Self-pay

## 2024-02-06 ENCOUNTER — Other Ambulatory Visit (HOSPITAL_COMMUNITY): Payer: Self-pay

## 2024-02-07 ENCOUNTER — Encounter (HOSPITAL_COMMUNITY): Payer: Self-pay

## 2024-02-07 ENCOUNTER — Other Ambulatory Visit (HOSPITAL_COMMUNITY): Payer: Self-pay

## 2024-02-19 ENCOUNTER — Other Ambulatory Visit: Payer: Self-pay

## 2024-02-19 ENCOUNTER — Other Ambulatory Visit: Payer: Self-pay | Admitting: Cardiovascular Disease

## 2024-02-21 ENCOUNTER — Other Ambulatory Visit: Payer: Self-pay

## 2024-02-21 ENCOUNTER — Other Ambulatory Visit (HOSPITAL_COMMUNITY): Payer: Self-pay

## 2024-02-21 MED ORDER — FUROSEMIDE 20 MG PO TABS
20.0000 mg | ORAL_TABLET | Freq: Every day | ORAL | 0 refills | Status: DC
  Filled 2024-02-21: qty 90, 90d supply, fill #0

## 2024-02-23 ENCOUNTER — Other Ambulatory Visit: Payer: Self-pay | Admitting: Internal Medicine

## 2024-02-23 NOTE — Telephone Encounter (Signed)
 Copied from CRM [id number]. Topic: Clinical - Medication Refill >> Feb 23, 2024  3:53 PM [place] E wrote: Medication: zolpidem  (AMBIEN ) 10 MG tablet   Has the patient contacted their pharmacy? Yes (Agent: If no, request that the patient contact the pharmacy for the refill. If patient does not wish to contact the pharmacy document the reason why and proceed with request.) (Agent: If yes, when and what did the pharmacy advise?)  This is the patient's preferred pharmacy:    [id number] - [place], [place] - [address] AT [facility] OF [address] [place] [address] Phone: [phone] Fax: [phone]  Is this the correct pharmacy for this prescription? Yes If no, delete pharmacy and type the correct one.   Has the prescription been filled recently? No  Is the patient out of the medication? No, 3-4 pills left.  Has the patient been seen for an appointment in the last year OR does the patient have an upcoming appointment? Yes  Can we respond through MyChart? Yes  Agent: Please be advised that Rx refills may take up to 3 business days. We ask that you follow-up with your pharmacy.

## 2024-02-24 MED ORDER — ZOLPIDEM TARTRATE 10 MG PO TABS: ORAL_TABLET | ORAL | 1 refills | Status: DC

## 2024-02-28 ENCOUNTER — Other Ambulatory Visit (HOSPITAL_COMMUNITY): Payer: Self-pay

## 2024-03-19 ENCOUNTER — Other Ambulatory Visit: Payer: Self-pay

## 2024-03-19 ENCOUNTER — Other Ambulatory Visit: Payer: Self-pay | Admitting: Internal Medicine

## 2024-03-19 ENCOUNTER — Other Ambulatory Visit (HOSPITAL_COMMUNITY): Payer: Self-pay

## 2024-03-19 MED ORDER — GABAPENTIN 300 MG PO CAPS
300.0000 mg | ORAL_CAPSULE | Freq: Every day | ORAL | 1 refills | Status: AC
  Filled 2024-03-19: qty 90, 90d supply, fill #0
  Filled 2024-06-15: qty 90, 90d supply, fill #1

## 2024-03-19 MED ORDER — PAROXETINE HCL 40 MG PO TABS
40.0000 mg | ORAL_TABLET | ORAL | 1 refills | Status: AC
  Filled 2024-03-19: qty 90, 90d supply, fill #0

## 2024-03-25 ENCOUNTER — Other Ambulatory Visit (HOSPITAL_COMMUNITY): Payer: Self-pay

## 2024-03-25 ENCOUNTER — Other Ambulatory Visit: Payer: Self-pay | Admitting: Internal Medicine

## 2024-03-26 ENCOUNTER — Ambulatory Visit: Payer: 59 | Admitting: Neurology

## 2024-03-26 ENCOUNTER — Encounter: Payer: Self-pay | Admitting: Neurology

## 2024-03-26 VITALS — BP 169/105 | HR 81 | Ht 71.0 in | Wt 203.4 lb

## 2024-03-26 DIAGNOSIS — R413 Other amnesia: Secondary | ICD-10-CM

## 2024-03-26 DIAGNOSIS — G3184 Mild cognitive impairment, so stated: Secondary | ICD-10-CM | POA: Diagnosis not present

## 2024-03-26 DIAGNOSIS — I639 Cerebral infarction, unspecified: Secondary | ICD-10-CM

## 2024-03-26 NOTE — Progress Notes (Signed)
 [facility] [address] [place]. [place] [address] [phone]       OFFICE FOLLOW-UP NOTE  [name] Date of Birth:  12-Dec-1945 Medical Record Number:  [id number]   HPI: Initial visit 07/22/2021 :[name] is a pleasant 78 year old Caucasian male initial office follow-up visit following initial hospital consultation visit for stroke in November 2022.  History is obtained from the patient and review of electronic medical records.  I have also personally reviewed available pertinent imaging films in  PACS.Collin Bridges is a 78 y.o. male with a medical history significant for anxiety, depression, hepatitis C, chronic kidney disease stage IIIa, hyperlipidemia, essential hypertension who presented to the ED 11/23 after being sent by his primary care provider due to abnormal MRI report results.  Patient states that he fell on March 10, 2021 in the shower after he slipped sustaining a laceration requiring sutures to the right frontal scalp.  He states he his neck paralyzed from neck down for couple of hours and could not move call for help for a few hours but fortunately recovered.At that time CT imaging revealed a small meningioma below the anterior aspect of the tentorium on the left and was discharged with recommendation for MRI follow-up.  Patient had MRI brain outpatient on 11/19 with results on 11/22 revealing subcentimeter subacute bilateral cerebral infarcts with a 12 mm mass along the left tentorium compatible with a meningioma without mass-effect or edema.  MRI imaging also revealed severe chronic small vessel ischemic disease and patient was asked by his primary provider to come to the ED for further evaluation and work-up of suspected stroke. At baseline patient lives independently, states he does not have any living family members and he does not drive.  He is able to complete his ADLs independently and walks without a walker.  Patient states that he  has been on 325 mg of aspirin  for approximately 30 years.  He also endorses blurry vision after falling that resolved after 5 days.  MRI scan of the brain showed tiny suspected subcentimeter cerebellar, left parietal and right frontal subcortical white matter acute infarcts.  12 mm left tentorium likely meningioma.  There was severe changes of chronic small vessel disease.  MRA of the neck was unremarkable.  MRA of the brain with degenerative right posterior cerebral artery aplastic versus occluded A1 segment of the right.  2D echo showed ejection fraction 60 to 65% with suspected calcific beneath the  mitral valve.  LDL cholesterol was 110 mg percent and A1c was 5.8.  Patient was started on dual antiplatelet therapy aspirin  and Plavix  tolerating well with only minor bruising.   He has not had any recurrent TIA or stroke symptoms.  His blood pressure is under good control.  He is tolerating Lipitor  well without muscle aches and pains.  He had outpatient TEE on 06/16/2021 which confirmed calcified chordal tissue under the mitral valve apparatus.  Likely a benign finding.  Redundancy of the interatrial septum but no right-to-left shunt noted.  There was grade 3 aortic.arch plaque noted.  No clot or PFO seen. Update 02/14/2022 : He returns for follow-up after last visit 6 months ago.  Patient states is doing well and had no recurrent stroke or TIA symptoms.  Remains on Plavix  which is tolerating well without bruising or bleeding.  He states his blood pressure continues to be high and is working with his primary care physician and today it is elevated at 176/76 in office.  He is  tolerating Lipitor  well without side effects.  Last lipid profile on 12/01/2021 showed total cholesterol 158, triglycerides 269, HDL 31 and LDL 126.  Sugars also remain high and last hemoglobin A1c 2 months ago was 7.5.  Patient has new complaints of short-term memory difficulties which he states is for several months now.  He has difficulty  remembering names.  But may remember them sometimes later.  Living with his girlfriend but is mostly independent actives of daily living.  Is responsible for his own medications.  Given up driving years ago.  There is no family history of dementia.  He denies any headache, seizures, head injury with loss of consciousness.  He has not yet had any work-up for reversible causes of memory loss.  He has a small left tentorial meningioma and is now due for surveillance repeat MRI Updated 05/26/2022 : He returns for follow-up after last visit 3 months ago.  He states he is doing well and his short-term memory difficulties are unchanged..  I ordered MRI scan of the brain at last visit for unclear reasons that has not been done.  EEG done on 02/28/2021 was normal.  Lab work on 02/14/2022 showed normal vitamin B12, TSH, RPR and homocystine.  Headache With left foot infected and he is currently on antibiotics for that.  Recurrent stroke or TIA symptoms.  He is tolerating Plavix  well without bruising or bleeding.  Blood pressure is normally under good control though today it is elevated at 161/77.  States his sugars and good control.No new complaints today Update 08/30/2022 : .   He returns for follow-up after last visit 3 months ago.  He states even has had a rough last few months results of pneumonia and being diagnosed with multivessel coronary artery disease requiring cardiac surgery soon.  He was admitted in February with fever and chills and feeling weak and diagnosed with pneumonia.  He was also having chest pain on the right side and some shortness of breath.  Troponins were elevated and cardiology was consulted.  MRI scan showed small acute to subacute infarcts in the left basal ganglia, right frontal and left occipital periventricular white matter likely lacunar infarcts from small vessel disease.  He has no new focal motor deficits related to these.  There is unchanged appearance of the small left tentorial  meningioma.  MR angiogram of the brain showed no significant large vessel stenosis.  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  LDL cholesterol was 140 mg percent and hemoglobin A1c was 6.3.  CT scan of the chest showed right upper lobe opacity consistent with pneumonia.  CT angiogram of the coronaries showed severe flow-limiting coronary artery disease in the left anterior descending, left circumflex and right coronary arteries.  Patient has been started on aspirin  and Plavix  and is scheduled to undergo cardiac bypass surgery soon.  He states his memory difficulties are unchanged.  On the MoCA testing today scored 22/36 is unchanged from last visit.  He still feels weak and has low stamina.  Still feels he is getting over his pneumonia.  Still has a weak cough and does have intermittent coughing. Update 03/27/2023 : He returns for follow-up after last visit with me 6 and half months ago.  He states he is doing well and has not had any recurrent TIA or stroke symptoms.  He remains on aspirin  and Plavix  which is tolerating well without bruising or bleeding.  His sugars are all under good control and  metformin  and last A1c was 5.5 on 12/06/2022.  He is on Repatha  injections and Lipitor  and states he is tolerating them well without side effects.  States last lipid profile was quite satisfactory when checked by primary physician but I do not have the results to review today.  He has cataracts and plans to undergo surgery in January next year.  He recently twisted his hip and has some pain and difficulty walking from that.  Is also had some leg swelling for which his cardiologist has prescribed some Lasix .   Update 03/26/2024 : He returns for follow-up after last visit a year ago.  He states he is doing well.  He has not had any recurrent symptoms of TIA or strokes.  He remains on Plavix  which is tolerating well without significant bruising or bleeding.  He states his blood pressure at  home is quite well-controlled but he has whitecoat hypertension and today it is elevated in office at 169/105.  He is on Repatha  injections which is tolerating well without side effects.  He states his memory difficulties are unchanged and in fact maybe even slightly better.  He continues to live alone.  He is independent in actives of daily living.  He does not drive and hence his friend helps him with groceries but otherwise he can do all activities for himself.  He had lab work done on 02/01/2024 and hemoglobin A1c was 6.6 and LDL cholesterol was 13 mg percent.  He has no complaints today. ROS:   14 system review of systems is positive for  memory loss, difficulty remembering names gait imbalance, falls, hip pain, difficulty walking, leg swelling and all other systems negative  PMH:  Past Medical History:  Diagnosis Date   Allergy    seasonal   Anginal pain    Anxiety    Brain aneurysm 2022   'watching it' ,HAS NOT HAD SURGERY- dR Gwendalyn Mcgonagle   CHF (congestive heart failure) (HCC)    Coronary artery disease    Depression    Diabetes mellitus without complication (HCC)    Type 2   GERD (gastroesophageal reflux disease)    Pt states take ambien  for this   Hepatitis C    HLD (hyperlipidemia) 08/09/2017   Hypertension    Hypoglycemia    Pneumonia 06/2022   Stroke (HCC) 03/2021   TIA    Social History:  Social History   Socioeconomic History   Marital status: Single    Spouse name: Not on file   Number of children: 0   Years of education: some college   Highest education level: Associate degree: occupational, scientist, product/process development, or vocational program  Occupational History   Occupation: retired  Tobacco Use   Smoking status: Former   Smokeless tobacco: Never   Tobacco comments:    quit 52yrs ago, smoke a cigar occassionally.  Vaping Use   Vaping status: Never Used  Substance and Sexual Activity   Alcohol  use: Not Currently    Comment: Quit 30 years ago   Drug use: No   Sexual  activity: Yes  Other Topics Concern   Not on file  Social History Narrative   Lives alone.   Right-handed.   Two cups caffeine per day.   Social Drivers of Health   Financial Resource Strain: Low Risk  (01/29/2024)   Overall Financial Resource Strain (CARDIA)    Difficulty of Paying Living Expenses: Not very hard  Food Insecurity: Food Insecurity Present (01/29/2024)   Hunger Vital Sign  Worried About Programme Researcher, Broadcasting/film/video in the Last Year: Sometimes true    [employer] of Food in the Last Year: Sometimes true  Transportation Needs: No Transportation Needs (01/29/2024)   PRAPARE - Administrator, Civil Service (Medical): No    Lack of Transportation (Non-Medical): No  Physical Activity: Insufficiently Active (01/29/2024)   Exercise Vital Sign    Days of Exercise per Week: 3 days    Minutes of Exercise per Session: 30 min  Stress: No Stress Concern Present (01/29/2024)   [employer] of Occupational Health - Occupational Stress Questionnaire    Feeling of Stress: Not at all  Social Connections: Socially Isolated (01/29/2024)   Social Connection and Isolation Panel    Frequency of Communication with Friends and Family: More than three times a week    Frequency of Social Gatherings with Friends and Family: Once a week    Attends Religious Services: Never    Database Administrator or Organizations: No    Attends Engineer, Structural: Not on file    Marital Status: Divorced  Intimate Partner Violence: Unknown (12/21/2022)   Received from [facility]   HITS    Physically Hurt: Not on file    Insult or Talk Down To: Not on file    Threaten Physical Harm: Not on file    Scream or Curse: Not on file    Medications:   Current Outpatient Medications on File Prior to Visit  Medication Sig Dispense Refill   aspirin  EC 81 MG tablet Take 1 tablet (81 mg total) by mouth daily. Swallow whole. 30 tablet 12   cholecalciferol (VITAMIN D3) 25 MCG (1000 UNIT) tablet Take 1  tablet (1,000 Units total) by mouth daily. Take 1,000 Units by mouth daily. 90 tablet 2   clopidogrel  (PLAVIX ) 75 MG tablet Take 1 tablet (75 mg total) by mouth daily. 90 tablet 3   Evolocumab  (REPATHA  SURECLICK) 140 MG/ML SOAJ Inject 140 mg into the skin every 14 days. 6 mL 3   fluticasone  (FLONASE ) 50 MCG/ACT nasal spray Place 2 sprays into both nostrils daily. 48 g 1   furosemide  (LASIX ) 20 MG tablet Take 1 tablet (20 mg total) by mouth daily. 90 tablet 0   gabapentin  (NEURONTIN ) 300 MG capsule Take 1 capsule (300 mg total) by mouth at bedtime. 90 capsule 1   magnesium  oxide (MAG-OX) 400 (240 Mg) MG tablet Take 400 mg by mouth daily.     metoprolol  tartrate (LOPRESSOR ) 25 MG tablet Take 1 tablet (25 mg total) by mouth 2 (two) times daily. 180 tablet 3   PARoxetine  (PAXIL ) 40 MG tablet Take 1 tablet (40 mg total) by mouth every morning. 90 tablet 1   tadalafil  (CIALIS ) 20 MG tablet Take 0.5-1 tablets (10-20 mg total) by mouth every other day as needed for erectile dysfunction. 10 tablet 11   vitamin E 180 MG (400 UNITS) capsule Take 400 Units by mouth daily.     zolpidem  (AMBIEN ) 10 MG tablet TAKE 1 TABLET(10 MG) BY MOUTH AT BEDTIME AS NEEDED FOR SLEEP 90 tablet 1   PARoxetine  (PAXIL ) 40 MG tablet Take 1 tablet (40 mg total) by mouth every morning. (Patient not taking: Reported on 03/26/2024) 90 tablet 3   No current facility-administered medications on file prior to visit.    Allergies:  No Known Allergies  Physical Exam General: well developed, well nourished, pleasant elderly Caucasian male seated, in no evident distress Head: head normocephalic and atraumatic.  Neck:  supple with no carotid or supraclavicular bruits Cardiovascular: regular rate and rhythm, no murmurs Musculoskeletal: no deformity Skin:  no rash/petichiae Vascular:  Normal pulses all extremities Vitals:   03/26/24 1401  BP: (!) 169/105  Pulse: 81  SpO2: 98%   Neurologic Exam Mental Status: Awake and fully alert.  Oriented to place and time. Recent and remote memory intact. Attention span, concentration and fund of knowledge appropriate. Mood and affect appropriate.  Diminished recall 1/3.  Able to name 9 animals which can walk on 4 legs.  Clock drawing 4/4.  MMSE not done but was 22/30 at prior visit on 08/30/2022 Cranial Nerves: Fundoscopic exam not done pupils equal, briskly reactive to light. Extraocular movements full without nystagmus. Visual fields full to confrontation. Hearing intact. Facial sensation intact. Face, tongue, palate moves normally and symmetrically.  Motor: Normal bulk and tone. Normal strength in all tested extremity muscles. Sensory.: intact to touch ,pinprick .position and vibratory sensation.  Coordination: Rapid alternating movements normal in all extremities. Finger-to-nose and heel-to-shin performed accurately bilaterally. Gait and Station: Arises from chair without difficulty. Stance is normal. Gait demonstrates normal stride length and balance . Able to heel, toe and tandem walk with moderate difficulty.  Reflexes: 1+ and symmetric. Toes downgoing.     08/30/2022    2:11 PM 02/14/2022    8:51 AM  Montreal Cognitive Assessment   Visuospatial/ Executive (0/5) 4 3  Naming (0/3) 3 3  Attention: Read list of digits (0/2) 1 2  Attention: Read list of letters (0/1) 1 1  Attention: Serial 7 subtraction starting at 100 (0/3) 3 3  Language: Repeat phrase (0/2) 2 1  Language : Fluency (0/1) 0 1  Abstraction (0/2) 2 2  Delayed Recall (0/5) 0 0  Orientation (0/6) 6 6  Total 22 22      ASSESSMENT: 78 year old Caucasian male with bicerebral tiny subcortical silent lacunar stroke from small vessel disease.  Vascular risk factors of hypertension and hyperlipidemia.  New complaints of memory loss due to mild cognitive impairment.  History of small left tentorial meningioma and small 1 to 2 mm ACOM aneurysm which appear stable.     PLAN:  I had a long discussion with the patient  regarding his recent lacunar strokes and mild cognitive impairment and discussed about stroke prevention and treatment and answered questions.  We also discussed his mild cognitive impairment which appears stable .  Continue aspirin  and Plavix  given his significant coronary artery disease and aggressive risk factor modification with strict control of hypertension with blood pressure goal below 140/90, lipids with LDL cholesterol goal below 70 mg percent and see goal below 6.5%..  I encouraged him to increase participation in cognitively challenging activities like solving crossword puzzles, doing word searches, playing bridge and other cognitively challenging activities.  We also discussed memory compensation strategies.  He will return for follow-up in the future in 1 year or call earlier if necessary.. Greater than 50% of time during this  35 minute visit was spent on counseling,explanation of diagnosis of lacunar stroke, memory loss and mild cognitive impairment as well as lacunar strokes, planning of further management, discussion with patient and family and coordination of care Eather Popp, MD Note: This document was prepared with digital dictation and possible smart phrase technology. Any transcriptional errors that result from this process are unintentional

## 2024-03-26 NOTE — Patient Instructions (Addendum)
 I Memory Compensation Strategies  Use WARM strategy.  W= write it down  A= associate it  R= repeat it  M= make a mental note  2.   You can keep a Glass Blower/designer.  Use a 3-ring notebook with sections for the following: calendar, important names and phone numbers,  medications, doctors' names/phone numbers, lists/reminders, and a section to journal what you did  each day.   3.    Use a calendar to write appointments down.  4.    Write yourself a schedule for the day.  This can be placed on the calendar or in a separate section of the Memory Notebook.  Keeping a  regular schedule can help memory.  5.    Use medication organizer with sections for each day or morning/evening pills.  You may need help loading it  6.    Keep a basket, or pegboard by the door.  Place items that you need to take out with you in the basket or on the pegboard.  You may also want to  include a message board for reminders.  7.    Use sticky notes.  Place sticky notes with reminders in a place where the task is performed.  For example:  turn off the  stove placed by the stove, lock the door placed on the door at eye level,  take your medications on  the bathroom mirror or by the place where you normally take your medications.  8.    Use alarms/timers.  Use while cooking to remind yourself to check on food or as a reminder to take your medicine, or as a  reminder to make a call, or as a reminder to perform another task, etc. had a long discussion with the patient regarding his recent lacunar strokes and mild cognitive impairment and discussed about stroke prevention and treatment and answered questions.  We also discussed his mild cognitive impairment which appears stable .  Continue aspirin  and Plavix  given his significant coronary artery disease and aggressive risk factor modification with strict control of hypertension with blood pressure goal below 140/90, lipids with LDL cholesterol goal below 70 mg  percent and see goal below 6.5%..  I encouraged him to increase participation in cognitively challenging activities like solving crossword puzzles, doing word searches, playing bridge and other cognitively challenging activities.  We also discussed memory compensation strategies.  He will return for follow-up in the future in 1 year or call earlier if necessary.SABRA

## 2024-04-01 ENCOUNTER — Telehealth: Payer: Self-pay | Admitting: Cardiovascular Disease

## 2024-04-01 NOTE — Telephone Encounter (Signed)
 Pt c/o swelling: STAT is pt has developed SOB within 24 hours  How much weight have you gained and in what time span? Unsure  If swelling, where is the swelling located? Both legs (knees to ankles)  Are you currently taking a fluid pill? yes  Are you currently SOB? No  Do you have a log of your daily weights (if so, list)? No  Have you gained 3 pounds in a day or 5 pounds in a week? Unsure  Have you traveled recently? No

## 2024-04-01 NOTE — Telephone Encounter (Signed)
 Spoke to patient - notice swelling knees to ankles  for 2 days. Has not weighed himself. Swelling does go down somewhat at night but not completely. He does not wear compression  socks  He states he takes Lasix  everyday.  Does not have blood pressure machine.   Rn gave him an appt 04/05/24 at #:25 with [name] Instructed patient to take an extra lasix  in the morning. If swelling does not go down  by Wednesday  then take an extra  dose of Lasix     Bring all medication bottles to appointment  An start weighing every morning and bring information to appointment.  Patient states he has to give his ride a 3 day notice for provider appointment    He verbalized understanding.

## 2024-04-04 NOTE — Progress Notes (Signed)
 Cardiology Office Note:   Date:  04/05/2024  ID:  Collin Bridges, Collin Bridges 08-Apr-1946, MRN [id number] PCP: Norleen Collin ORN, MD  [facility] Providers Cardiologist:  Darryle ONEIDA Decent, MD {  History of Present Illness:   Collin Bridges is a 78 y.o. male who was hospitalized 2/24 with severe sepsis secondary to community-acquired pneumonia and NSTEMI.  His echocardiogram showed EF of 60-65%, hypokinesis of basal inferior segment, G1 DD, mild aortic valve stenosis.  His coronary CTA showed multivessel CAD and his FFR was positive with mid LAD, mid-distal left circumflex, proximal RCA stenosis.  He underwent LHC and was noted to have multivessel CAD in his LAD, D1, circumflex, OM1.  His anatomy was not favorable for PCI.  CT surgery was consulted.  He underwent MRI/A of the head during admission which showed scattered bilateral subcortical strokes.  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  He was followed by neurology.  He underwent CABG x 5 10/21/2022.    He has had lacunar infarcts.  He saw Dr. Rosemarie.  The infarcts were thought to be related to small vessel disease.    He says in the past month he has gained about 10 pounds.  He is at increased lower extremity swelling.  He has not had any chest discomfort, neck or arm discomfort.  He has not had any palpitations, presyncope or syncope.  He is not describing PND or orthopnea.  He actually does not report a lot of shortness of breath and still does some work in his yard.  He has not had any cough fevers or chills.  He watches his salt.  He did take an extra 20 mg of Lasix  for a couple of days per the direction of our nurses.  He thinks this helped a little bit.  He put on some compression socks which she thought helped a little bit.   ROS: As stated in the HPI and negative for all other systems.  Studies Reviewed:    EKG:   EKG  Interpretation Date/Time:  Friday April 05 2024 15:33:31 EST Ventricular Rate:  68 PR Interval:  152 QRS Duration:  126 QT Interval:  412 QTC Calculation: 438 R Axis:   -32  Text Interpretation: Normal sinus rhythm Left axis deviation Right bundle branch block When compared with ECG of 01-Dec-2022 10:18, Criteria for Septal infarct are no longer Present Nonspecific T wave abnormality now evident in Inferior leads T wave inversion no longer evident in Anterolateral leads QT has shortened Confirmed by Collin Bridges ([id number]) on 04/05/2024 4:24:41 PM    Risk Assessment/Calculations:     Physical Exam:   VS:  BP (!) 174/84   Pulse 68   Ht 5' 11 (1.803 m)   Wt 202 lb (91.6 kg)   SpO2 96%   BMI 28.17 kg/m    Wt Readings from Last 3 Encounters:  04/05/24 202 lb (91.6 kg)  03/26/24 203 lb 6.4 oz (92.3 kg)  02/01/24 187 lb 9.6 oz (85.1 kg)     GEN: Well nourished, well developed in no acute distress NECK: No JVD; No carotid bruits CARDIAC: RRR, 3 out of 6 mid peaking apical systolic murmur radiating out the aortic outflow tract murmurs, rubs, gallops RESPIRATORY: Decreased breath sounds with dullness to percussion bilateral one third of the way up both lung fields ABDOMEN: Soft, non-tender, non-distended EXTREMITIES: Severe edema to the hips bilaterally  ASSESSMENT AND PLAN:   Coronary Artery Disease (CAD) status post 5-vessel CABG: He is not having any chest discomfort.  He will continue with meds as listed and secondary risk reduction.   Post op Afib: He has had no recurrence of this.  No change in therapy.  Lower Extremity Edema: He has anasarca.  I am going to have him take 40 mg of Lasix  twice daily for the next 2 days.  But then cut back to 40 once a day.  I will check a BNP when he comes back with a be met.  He needs an echocardiogram.  He is going to keep his feet elevated and watch his salt.  I am going to check a chest x-ray as I suspect he has bilateral pleural  effusions.  Pericardial effusion: He had a previous pericardial effusion and I will follow this up with the echo as above.    Hypertension: Blood pressure is elevated.  He is going to keep a BP diary.     Chronic Kidney Disease (CKD) Stage 3A: I will follow this closely.   Diabetes: A1c is 6.6.  No change in therapy.   History of Stroke:  He is following with Dr. Rosemarie.  Continue current therapy.       Follow up with me next week or Dr. Vernice if he has space.  Signed, Collin Schilling, MD

## 2024-04-05 ENCOUNTER — Ambulatory Visit: Attending: Cardiology | Admitting: Cardiology

## 2024-04-05 ENCOUNTER — Encounter: Payer: Self-pay | Admitting: Cardiology

## 2024-04-05 ENCOUNTER — Other Ambulatory Visit (HOSPITAL_COMMUNITY): Payer: Self-pay

## 2024-04-05 VITALS — BP 174/84 | HR 68 | Ht 71.0 in | Wt 202.0 lb

## 2024-04-05 DIAGNOSIS — I25118 Atherosclerotic heart disease of native coronary artery with other forms of angina pectoris: Secondary | ICD-10-CM

## 2024-04-05 DIAGNOSIS — I5032 Chronic diastolic (congestive) heart failure: Secondary | ICD-10-CM

## 2024-04-05 DIAGNOSIS — N1831 Chronic kidney disease, stage 3a: Secondary | ICD-10-CM

## 2024-04-05 DIAGNOSIS — R601 Generalized edema: Secondary | ICD-10-CM

## 2024-04-05 DIAGNOSIS — M7989 Other specified soft tissue disorders: Secondary | ICD-10-CM | POA: Diagnosis not present

## 2024-04-05 DIAGNOSIS — I10 Essential (primary) hypertension: Secondary | ICD-10-CM | POA: Diagnosis not present

## 2024-04-05 DIAGNOSIS — I5033 Acute on chronic diastolic (congestive) heart failure: Secondary | ICD-10-CM

## 2024-04-05 MED ORDER — FUROSEMIDE 40 MG PO TABS
40.0000 mg | ORAL_TABLET | Freq: Every day | ORAL | 1 refills | Status: AC
  Filled 2024-04-05: qty 90, 90d supply, fill #0
  Filled 2024-05-27 – 2024-06-12 (×3): qty 90, 90d supply, fill #1

## 2024-04-05 NOTE — Patient Instructions (Signed)
 Medication Instructions:  INCREASE: please take Lasix  40 mg two times per day on Saturday and Sunday (this weekend), and then on Monday start taking 40 mg once per day   Lab Work: Today: BMET and BNP  If you have labs (blood work) drawn today and your tests are completely normal, you will receive your results only by: [employer] (if you have MyChart) OR A paper copy in the mail If you have any lab test that is abnormal or we need to change your treatment, we will call you to review the results.  Testing/Procedures: A chest x-ray takes a picture of the organs and structures inside the chest, including the heart, lungs, and blood vessels. This test can show several things, including, whether the heart is enlarges; whether fluid is building up in the lungs; and whether pacemaker / defibrillator leads are still in place.   Your physician has requested that you have an echocardiogram, asap. Echocardiography is a painless test that uses sound waves to create images of your heart. It provides your doctor with information about the size and shape of your heart and how well your heart's chambers and valves are working. This procedure takes approximately one hour. There are no restrictions for this procedure. Please do NOT wear cologne, perfume, aftershave, or lotions (deodorant is allowed). Please arrive 15 minutes prior to your appointment time.  Please note: We ask at that you not bring children with you during ultrasound (echo/ vascular) testing. Due to room size and safety concerns, children are not allowed in the ultrasound rooms during exams. Our front office staff cannot provide observation of children in our lobby area while testing is being conducted. An adult accompanying a patient to their appointment will only be allowed in the ultrasound room at the discretion of the ultrasound technician under special circumstances. We apologize for any inconvenience.   Follow-Up: At [facility], you and your health needs are our priority.  As part of our continuing mission to provide you with exceptional heart care, our providers are all part of one team.  This team includes your primary Cardiologist (physician) and Advanced Practice Providers or APPs (Physician Assistants and Nurse Practitioners) who all work together to provide you with the care you need, when you need it.  Your next appointment:    Will see you on 04/10/24 if Dr. Barbaraann not available  Provider:   Darryle ONEIDA Barbaraann, MD or Lynwood Schilling, MD  We recommend signing up for the patient portal called MyChart.  Sign up information is provided on this After Visit Summary.  MyChart is used to connect with patients for Virtual Visits (Telemedicine).  Patients are able to view lab/test results, encounter notes, upcoming appointments, etc.  Non-urgent messages can be sent to your provider as well.   To learn more about what you can do with MyChart, go to [email or web address].

## 2024-04-06 ENCOUNTER — Other Ambulatory Visit (HOSPITAL_COMMUNITY): Payer: Self-pay

## 2024-04-08 ENCOUNTER — Other Ambulatory Visit: Payer: Self-pay | Admitting: Internal Medicine

## 2024-04-08 ENCOUNTER — Other Ambulatory Visit (HOSPITAL_COMMUNITY): Payer: Self-pay

## 2024-04-08 ENCOUNTER — Other Ambulatory Visit: Payer: Self-pay

## 2024-04-08 MED ORDER — FENOFIBRATE 160 MG PO TABS
160.0000 mg | ORAL_TABLET | Freq: Every day | ORAL | 1 refills | Status: AC
  Filled 2024-04-08: qty 90, 90d supply, fill #0

## 2024-04-08 NOTE — Progress Notes (Unsigned)
 Cardiology Office Note:   Date:  04/10/2024  ID:  Collin Bridges, Collin Bridges, MRN [id number] PCP: Collin Bridges ORN, MD  [facility] Providers Cardiologist:  Collin ONEIDA Decent, MD {  History of Present Illness:   Collin Bridges is a 78 y.o. male who was hospitalized 2/24 with severe sepsis secondary to community-acquired pneumonia and NSTEMI.  His echocardiogram showed EF of 60-65%, hypokinesis of basal inferior segment, G1 DD, mild aortic valve stenosis.  His coronary CTA showed multivessel CAD and his FFR was positive with mid LAD, mid-distal left circumflex, proximal RCA stenosis.  He underwent LHC and was noted to have multivessel CAD in his LAD, D1, circumflex, OM1.  His anatomy was not favorable for PCI.  CT surgery was consulted.  He underwent MRI/A of the head during admission which showed scattered bilateral subcortical strokes.  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  He was followed by neurology.  He underwent CABG x 5 10/21/2022.    He has had lacunar infarcts.  He saw Dr. Rosemarie.  The infarcts were thought to be related to small vessel disease.  I saw him as [name] add on last week and he had edema to his hips.  He had gained weight without PND or orthopnea.  I did increase his diuretic.   I sent him for an echo.  This was done yesterday.  There was no evidence of pericardial effusion and no change from previous.  Chest x-ray did not suggest interstitial edema or pleural effusions.  He went home and he took 2 pills of Lasix  twice a day per my instructions.  We believe that these were 20 mg tablets so he was taking 40 mg twice a day.  They just sent in a new prescription for 40 mg tablets.  He is back down to taking 1 pill a day which would be 40 mg daily we believe.  He was unaware that the tablets were 40 mg.  He keeps up with his medicines pretty good.  His weight is not going down but his  fluid in his legs is a little bit better.  He is not having any new shortness of breath, PND or orthopnea.  He is wearing his compression socks.  He is avoiding salt.  ROS: As stated in the HPI and negative for all other systems.  Studies Reviewed:    EKG:     NA   Risk Assessment/Calculations:     Physical Exam:   VS:  BP (!) 144/90 (BP Location: Left Arm, Patient Position: Sitting, Cuff Size: Normal)   Pulse 80   Ht 5' 11 (1.803 m)   Wt 200 lb 14.4 oz (91.1 kg)   SpO2 95%   BMI 28.02 kg/m    Wt Readings from Last 3 Encounters:  04/10/24 200 lb 14.4 oz (91.1 kg)  04/05/24 202 lb (91.6 kg)  03/26/24 203 lb 6.4 oz (92.3 kg)      ASSESSMENT AND PLAN:   Coronary Artery Disease (CAD) status post 5-vessel CABG: He has not had any new chest pain.  No change in therapy.  He will continue with risk reduction.   Post op Afib: He has had no symptomatic recurrence of this.  No change in therapy.  Lower Extremity Edema: He does have some improvement in his lower extremity swelling.  Because of his renal insufficiency I am not going to  push his diuretics further.  He had no volume on his chest x-ray or pericardial effusion.  He is breathing okay.  He is going to keep his feet up, limit his salt, wear his stockings and take 40 mg of Lasix  daily.  For entheses of note we did clarify that his prescription now is a 40 mg Lasix  tablet sent from [facility] this weekend replacing the 20 mg he had previously so he understands only to take 1 pill daily.  Pericardial effusion: As above he had no recurrence of this.  Hypertension: Blood pressure is mildly elevated today.  He will keep a blood pressure diary and present this to his primary cardiologist.    Chronic Kidney Disease (CKD) Stage 3A: I will check a basic metabolic profile today.  I will make a referral to nephrology.  No change in therapy.    Diabetes: A1c is 6.6.    History of Stroke:  He is following with Dr. Rosemarie.     Follow up with with Dr. Vernice in a couple of months.  Signed, Collin Schilling, MD

## 2024-04-09 ENCOUNTER — Other Ambulatory Visit (HOSPITAL_COMMUNITY): Payer: Self-pay

## 2024-04-09 ENCOUNTER — Ambulatory Visit (HOSPITAL_COMMUNITY)

## 2024-04-09 ENCOUNTER — Ambulatory Visit (HOSPITAL_COMMUNITY)
Admission: RE | Admit: 2024-04-09 | Discharge: 2024-04-09 | Disposition: A | Discharge status: Home / Self Care | Source: Ambulatory Visit | Attending: Cardiology | Admitting: Cardiology

## 2024-04-09 DIAGNOSIS — I10 Essential (primary) hypertension: Secondary | ICD-10-CM | POA: Diagnosis not present

## 2024-04-09 DIAGNOSIS — I358 Other nonrheumatic aortic valve disorders: Secondary | ICD-10-CM | POA: Diagnosis not present

## 2024-04-09 DIAGNOSIS — R601 Generalized edema: Secondary | ICD-10-CM | POA: Diagnosis not present

## 2024-04-09 DIAGNOSIS — I5033 Acute on chronic diastolic (congestive) heart failure: Secondary | ICD-10-CM | POA: Diagnosis not present

## 2024-04-09 DIAGNOSIS — I110 Hypertensive heart disease with heart failure: Secondary | ICD-10-CM | POA: Diagnosis not present

## 2024-04-09 DIAGNOSIS — I3481 Nonrheumatic mitral (valve) annulus calcification: Secondary | ICD-10-CM | POA: Insufficient documentation

## 2024-04-09 LAB — BRAIN NATRIURETIC PEPTIDE: BNP: 29 pg/mL (ref 0.0–100.0)

## 2024-04-09 LAB — BASIC METABOLIC PANEL WITH GFR
BUN/Creatinine Ratio: 11 (ref 10–24)
BUN: 23 mg/dL (ref 8–27)
CO2: 30 mmol/L — ABNORMAL HIGH (ref 20–29)
Calcium: 9 mg/dL (ref 8.6–10.2)
Chloride: 94 mmol/L — ABNORMAL LOW (ref 96–106)
Creatinine, Ser: 2.1 mg/dL — ABNORMAL HIGH (ref 0.76–1.27)
Glucose: 237 mg/dL — ABNORMAL HIGH (ref 70–99)
Potassium: 4.2 mmol/L (ref 3.5–5.2)
Sodium: 139 mmol/L (ref 134–144)
eGFR: 31 mL/min/1.73 — ABNORMAL LOW (ref >59)

## 2024-04-09 LAB — ECHOCARDIOGRAM COMPLETE
AR max vel: 1.33 cm2
AV Area VTI: 1.46 cm2
AV Area mean vel: 1.33 cm2
AV Mean grad: 16 mmHg
AV Peak grad: 27.4 mmHg
Ao pk vel: 2.62 m/s
Area-P 1/2: 2.62 cm2
Calc EF: 58.9 %
S' Lateral: 2.2 cm
Single Plane A2C EF: 60.4 %
Single Plane A4C EF: 57.6 %

## 2024-04-09 NOTE — Progress Notes (Signed)
  Echocardiogram 2D Echocardiogram has been performed.  Collin Bridges 04/09/2024, 1:33 PM

## 2024-04-10 ENCOUNTER — Ambulatory Visit: Attending: Cardiology | Admitting: Cardiology

## 2024-04-10 ENCOUNTER — Encounter: Payer: Self-pay | Admitting: Cardiology

## 2024-04-10 VITALS — BP 144/90 | HR 80 | Ht 71.0 in | Wt 200.9 lb

## 2024-04-10 DIAGNOSIS — I3139 Other pericardial effusion (noninflammatory): Secondary | ICD-10-CM

## 2024-04-10 DIAGNOSIS — R601 Generalized edema: Secondary | ICD-10-CM | POA: Diagnosis not present

## 2024-04-10 DIAGNOSIS — I5033 Acute on chronic diastolic (congestive) heart failure: Secondary | ICD-10-CM

## 2024-04-10 NOTE — Patient Instructions (Signed)
 Medication Instructions:  Take 40 mg of Lasix  daily *If you need a refill on your cardiac medications before your next appointment, please call your pharmacy*  Lab Work: BMET today at [employer] If you have labs (blood work) drawn today and your tests are completely normal, you will receive your results only by: MyChart Message (if you have MyChart) OR A paper copy in the mail If you have any lab test that is abnormal or we need to change your treatment, we will call you to review the results.  Testing/Procedures: NONE  Follow-Up: At [facility], you and your health needs are our priority.  As part of our continuing mission to provide you with exceptional heart care, our providers are all part of one team.  This team includes your primary Cardiologist (physician) and Advanced Practice Providers or APPs (Physician Assistants and Nurse Practitioners) who all work together to provide you with the care you need, when you need it.  Your next appointment:   2 month(s)  Provider:   Darryle ONEIDA Decent, MD    We recommend signing up for the patient portal called MyChart.  Sign up information is provided on this After Visit Summary.  MyChart is used to connect with patients for Virtual Visits (Telemedicine).  Patients are able to view lab/test results, encounter notes, upcoming appointments, etc.  Non-urgent messages can be sent to your provider as well.   To learn more about what you can do with MyChart, go to [email or web address].   Other Instructions You have been referred to [facility]. Someone from their office will reach out to you to make an appointment.

## 2024-04-11 LAB — BASIC METABOLIC PANEL WITH GFR
BUN/Creatinine Ratio: 10 (ref 10–24)
BUN: 19 mg/dL (ref 8–27)
CO2: 27 mmol/L (ref 20–29)
Calcium: 9.8 mg/dL (ref 8.6–10.2)
Chloride: 98 mmol/L (ref 96–106)
Creatinine, Ser: 1.84 mg/dL — AB (ref 0.76–1.27)
Glucose: 111 mg/dL — ABNORMAL HIGH (ref 70–99)
Potassium: 4.7 mmol/L (ref 3.5–5.2)
Sodium: 141 mmol/L (ref 134–144)
eGFR: 37 mL/min/1.73 — AB (ref >59)

## 2024-04-13 ENCOUNTER — Ambulatory Visit: Payer: Self-pay | Admitting: Cardiology

## 2024-04-15 ENCOUNTER — Telehealth: Payer: Self-pay | Admitting: Cardiology

## 2024-04-15 NOTE — Telephone Encounter (Signed)
 Pt called in about referral from Dr. Lavona to [facility] because he hasn't heard anything back. They don't use EPIC, referral will have to be faxed. Please advise.     Fax: [phone]

## 2024-04-15 NOTE — Telephone Encounter (Signed)
 Returned call. Mailbox was full and could not leave message.

## 2024-04-24 ENCOUNTER — Ambulatory Visit (INDEPENDENT_AMBULATORY_CARE_PROVIDER_SITE_OTHER)

## 2024-04-24 VITALS — Ht 71.0 in | Wt 200.0 lb

## 2024-04-24 DIAGNOSIS — Z0000 Encounter for general adult medical examination without abnormal findings: Secondary | ICD-10-CM | POA: Diagnosis not present

## 2024-04-24 NOTE — Patient Instructions (Signed)
 Collin Bridges,  Thank you for taking the time for your Medicare Wellness Visit. I appreciate your continued commitment to your health goals. Please review the care plan we discussed, and feel free to reach out if I can assist you further.  Please note that Annual Wellness Visits do not include a physical exam. Some assessments may be limited, especially if the visit was conducted virtually. If needed, we may recommend an in-person follow-up with your provider.  Ongoing Care Seeing your primary care provider every 3 to 6 months helps us  monitor your health and provide consistent, personalized care.   Referrals If a referral was made during today's visit and you haven't received any updates within two weeks, please contact the referred provider directly to check on the status.  Recommended Screenings:  Health Maintenance  Topic Date Due   Flu Shot  12/15/2023   Eye exam for diabetics  06/11/2024   Yearly kidney health urinalysis for diabetes  07/26/2024   Hemoglobin A1C  07/31/2024   Colon Cancer Screening  09/12/2024   Complete foot exam   01/31/2025   Yearly kidney function blood test for diabetes  04/10/2025   Medicare Annual Wellness Visit  04/24/2025   DTaP/Tdap/Td vaccine (4 - Td or Tdap) 05/18/2032   Pneumococcal Vaccine for age over 32  Completed   Hepatitis C Screening  Completed   Zoster (Shingles) Vaccine  Completed   Meningitis B Vaccine  Aged Out   COVID-19 Vaccine  Discontinued       04/24/2024    8:11 AM  Advanced Directives  Does Patient Have a Medical Advance Directive? Yes  Type of Estate Agent of [place];Living will  Does patient want to make changes to medical advance directive? Yes (Inpatient - patient requests chaplain consult to change a medical advance directive)  Copy of Healthcare Power of Attorney in Chart? No - copy requested    Vision: Annual vision screenings are recommended for early detection of glaucoma, cataracts, and  diabetic retinopathy. These exams can also reveal signs of chronic conditions such as diabetes and high blood pressure.  Dental: Annual dental screenings help detect early signs of oral cancer, gum disease, and other conditions linked to overall health, including heart disease and diabetes.

## 2024-04-24 NOTE — Progress Notes (Signed)
 Chief Complaint  Patient presents with   Medicare Wellness     Subjective:   Collin Bridges is a 78 y.o. male who presents for a Medicare Annual Wellness Visit.  Visit info / Clinical Intake: Medicare Wellness Visit Type:: Subsequent Annual Wellness Visit Persons participating in visit and providing information:: patient Medicare Wellness Visit Mode:: Telephone If telephone:: video declined Since this visit was completed virtually, some vitals may be partially provided or unavailable. Missing vitals are due to the limitations of the virtual format.: Documented vitals are patient reported If Telephone or Video please confirm:: I connected with patient using audio/video enable telemedicine. I verified patient identity with two identifiers, discussed telehealth limitations, and patient agreed to proceed. Patient Location:: Home Provider Location:: Office Interpreter Needed?: No Pre-visit prep was completed: yes AWV questionnaire completed by patient prior to visit?: yes Date:: 04/21/24 Living arrangements:: (!) lives alone Patient's Overall Health Status Rating: very good Typical amount of pain: some Does pain affect daily life?: no Are you currently prescribed opioids?: no  Dietary Habits and Nutritional Risks How many meals a day?: 3 Eats fruit and vegetables daily?: yes Most meals are obtained by: preparing own meals In the last 2 weeks, have you had any of the following?: none Diabetic:: no  Functional Status Activities of Daily Living (to include ambulation/medication): Independent Ambulation: Independent with device- listed below Home Assistive Devices/Equipment: Eyeglasses; Dentures (specify type); Johna Finder (specify Type) Medication Administration: Independent Home Management (perform basic housework or laundry): Independent Manage your own finances?: yes Primary transportation is: family / friends; facility / other Concerns about vision?: no *vision  screening is required for WTM* Concerns about hearing?: no  Fall Screening Falls in the past year?: 0 Number of falls in past year: 0 Was there an injury with Fall?: 0 Fall Risk Category Calculator: 0 Patient Fall Risk Level: Low Fall Risk  Fall Risk Patient at Risk for Falls Due to: No Fall Risks Fall risk Follow up: Falls evaluation completed; Falls prevention discussed  Home and Transportation Safety: All rugs have non-skid backing?: (!) no All stairs or steps have railings?: yes Grab bars in the bathtub or shower?: yes Have non-skid surface in bathtub or shower?: yes Good home lighting?: yes Regular seat belt use?: yes Hospital stays in the last year:: (!) yes How many hospital stays:: 7 Reason: cardiac concerns - heart surgery  Cognitive Assessment Difficulty concentrating, remembering, or making decisions? : no Will 6CIT or Mini Cog be Completed: yes What year is it?: 0 points What month is it?: 0 points Give patient an address phrase to remember (5 components): [address] [place], [place] About what time is it?: 0 points Count backwards from 20 to 1: 2 points (3) Say the months of the year in reverse: 0 points Repeat the address phrase from earlier: 0 points 6 CIT Score: 2 points  Advance Directives (For Healthcare) Does Patient Have a Medical Advance Directive?: Yes Does patient want to make changes to medical advance directive?: Yes (Inpatient - patient requests chaplain consult to change a medical advance directive) Type of Advance Directive: Healthcare Power of [place]; Living will Copy of Healthcare Power of Attorney in Chart?: No - copy requested Copy of Living Will in Chart?: No - copy requested  Reviewed/Updated  Reviewed/Updated: Reviewed All (Medical, Surgical, Family, Medications, Allergies, Care Teams, Patient Goals)    Allergies (verified) Patient has no known allergies.   Current Medications (verified) Outpatient Encounter Medications as of  04/24/2024  Medication Sig  aspirin  EC 81 MG tablet Take 1 tablet (81 mg total) by mouth daily. Swallow whole.   cholecalciferol (VITAMIN D3) 25 MCG (1000 UNIT) tablet Take 1 tablet (1,000 Units total) by mouth daily. Take 1,000 Units by mouth daily.   clopidogrel  (PLAVIX ) 75 MG tablet Take 1 tablet (75 mg total) by mouth daily.   Evolocumab  (REPATHA  SURECLICK) 140 MG/ML SOAJ Inject 140 mg into the skin every 14 days.   fenofibrate  160 MG tablet Take 1 tablet (160 mg total) by mouth daily.   fluticasone  (FLONASE ) 50 MCG/ACT nasal spray Place 2 sprays into both nostrils daily. (Patient taking differently: Place 2 sprays into both nostrils daily. PRN)   furosemide  (LASIX ) 40 MG tablet Take 1 tablet (40 mg total) by mouth daily.   gabapentin  (NEURONTIN ) 300 MG capsule Take 1 capsule (300 mg total) by mouth at bedtime.   magnesium  oxide (MAG-OX) 400 (240 Mg) MG tablet Take 400 mg by mouth daily.   metoprolol  tartrate (LOPRESSOR ) 25 MG tablet Take 1 tablet (25 mg total) by mouth 2 (two) times daily.   PARoxetine  (PAXIL ) 40 MG tablet Take 1 tablet (40 mg total) by mouth every morning.   tadalafil  (CIALIS ) 20 MG tablet Take 0.5-1 tablets (10-20 mg total) by mouth every other day as needed for erectile dysfunction.   vitamin E 180 MG (400 UNITS) capsule Take 400 Units by mouth daily.   zolpidem  (AMBIEN ) 10 MG tablet TAKE 1 TABLET(10 MG) BY MOUTH AT BEDTIME AS NEEDED FOR SLEEP   PARoxetine  (PAXIL ) 40 MG tablet Take 1 tablet (40 mg total) by mouth every morning.   No facility-administered encounter medications on file as of 04/24/2024.    History: Past Medical History:  Diagnosis Date   Allergy    seasonal   Anginal pain    Anxiety    Brain aneurysm 2022   'watching it' ,HAS NOT HAD SURGERY- dR SETHI   CHF (congestive heart failure) (HCC)    Coronary artery disease    Depression    Diabetes mellitus without complication (HCC)    Type 2   GERD (gastroesophageal reflux disease)    Pt states  take ambien  for this   Hepatitis C    HLD (hyperlipidemia) 08/09/2017   Hypertension    Hypoglycemia    Pneumonia 06/2022   Stroke (HCC) 03/2021   TIA   Past Surgical History:  Procedure Laterality Date   APPENDECTOMY     BUBBLE STUDY  06/16/2021   Procedure: BUBBLE STUDY;  Surgeon: Barbaraann Darryle Ned, MD;  Location: [facility] ENDOSCOPY;  Service: Cardiovascular;;   COLONOSCOPY     CORONARY ARTERY BYPASS GRAFT N/A 11/10/2022   Procedure: CORONARY ARTERY BYPASS GRAFTING (CABG) X5 USING LEFT INTERNAL MAMMARY ARTERY (LIMA) AND ENDOSCOPICALLY HARVESTED RIGHT AND LEFT GREATER SAPHENOUS VEIN;  Surgeon: Maryjane Mt, MD;  Location: [facility] OR;  Service: Open Heart Surgery;  Laterality: N/A;   CORONARY ARTERY BYPASS GRAFT     5   FRACTURE SURGERY Left    Ankle   LEFT HEART CATH AND CORONARY ANGIOGRAPHY N/A 07/12/2022   Procedure: LEFT HEART CATH AND CORONARY ANGIOGRAPHY;  Surgeon: Anner Alm ORN, MD;  Location: [facility] INVASIVE CV LAB;  Service: Cardiovascular;  Laterality: N/A;   TEE WITHOUT CARDIOVERSION N/A 06/16/2021   Procedure: TRANSESOPHAGEAL ECHOCARDIOGRAM (TEE);  Surgeon: Barbaraann Darryle Ned, MD;  Location: [facility] ENDOSCOPY;  Service: Cardiovascular;  Laterality: N/A;   TEE WITHOUT CARDIOVERSION N/A 11/10/2022   Procedure: TRANSESOPHAGEAL ECHOCARDIOGRAM;  Surgeon: Maryjane Mt, MD;  Location:  [facility] OR;  Service: Open Heart Surgery;  Laterality: N/A;   TONSILECTOMY, ADENOIDECTOMY, BILATERAL MYRINGOTOMY AND TUBES  1955   Family History  Problem Relation Age of Onset   Lung disease Mother    Hyperlipidemia Father    Hypertension Father    Prostate cancer Paternal Uncle    Colon polyps Neg Hx    Colon cancer Neg Hx    Esophageal cancer Neg Hx    Rectal cancer Neg Hx    Stomach cancer Neg Hx    Social History   Occupational History   Occupation: retired  Tobacco Use   Smoking status: Former   Smokeless tobacco: Never   Tobacco comments:    quit 46yrs ago, smoke a cigar occassionally.   Vaping Use   Vaping status: Never Used  Substance and Sexual Activity   Alcohol  use: Not Currently    Comment: Quit 30 years ago   Drug use: No   Sexual activity: Yes   Tobacco Counseling Counseling given: Not Answered Tobacco comments: quit 68yrs ago, smoke a cigar occassionally.  SDOH Screenings   Food Insecurity: Food Insecurity Present (04/24/2024)  Housing: Low Risk  (04/24/2024)  Transportation Needs: No Transportation Needs (04/24/2024)  Utilities: Not At Risk (04/24/2024)  Alcohol  Screen: Low Risk  (12/06/2022)  Depression (PHQ2-9): Low Risk  (04/24/2024)  Financial Resource Strain: Low Risk  (04/21/2024)  Physical Activity: Insufficiently Active (04/24/2024)  Social Connections: Socially Isolated (04/24/2024)  Stress: No Stress Concern Present (04/24/2024)  Tobacco Use: Medium Risk (04/24/2024)  Health Literacy: Adequate Health Literacy (04/24/2024)   See flowsheets for full screening details  Depression Screen PHQ 2 & 9 Depression Scale- Over the past 2 weeks, how often have you been bothered by any of the following problems? Little interest or pleasure in doing things: 0 Feeling down, depressed, or hopeless (PHQ Adolescent also includes...irritable): 3 PHQ-2 Total Score: 3 Trouble falling or staying asleep, or sleeping too much: 0 Feeling tired or having little energy: 0 Poor appetite or overeating (PHQ Adolescent also includes...weight loss): 0 Feeling bad about yourself - or that you are a failure or have let yourself or your family down: 0 Trouble concentrating on things, such as reading the newspaper or watching television (PHQ Adolescent also includes...like school work): 0 Moving or speaking so slowly that other people could have noticed. Or the opposite - being so fidgety or restless that you have been moving around a lot more than usual: 0 Thoughts that you would be better off dead, or of hurting yourself in some way: 0 PHQ-9 Total Score: 3 If you checked  off any problems, how difficult have these problems made it for you to do your work, take care of things at home, or get along with other people?: Not difficult at all  Depression Treatment Depression Interventions/Treatment : EYV7-0 Score <4 Follow-up Not Indicated     Goals Addressed               This Visit's Progress     Patient Stated (pt-stated)        Patient stated he plans to monitor his fluid build-up and continue exercising             Objective:    Today's Vitals   04/24/24 0823  Weight: 200 lb (90.7 kg)  Height: 5' 11 (1.803 m)   Body mass index is 27.89 kg/m.  Hearing/Vision screen Hearing Screening - Comments:: Denies hearing difficulties   Vision Screening - Comments:: Wears rx glasses -  up to date with routine eye exams with [facility] Immunizations and Health Maintenance Health Maintenance  Topic Date Due   Influenza Vaccine  12/15/2023   OPHTHALMOLOGY EXAM  06/11/2024   Diabetic kidney evaluation - Urine ACR  07/26/2024   HEMOGLOBIN A1C  07/31/2024   Colonoscopy  09/12/2024   FOOT EXAM  01/31/2025   Diabetic kidney evaluation - eGFR measurement  04/10/2025   Medicare Annual Wellness (AWV)  04/24/2025   DTaP/Tdap/Td (4 - Td or Tdap) 05/18/2032   Pneumococcal Vaccine: 50+ Years  Completed   Hepatitis C Screening  Completed   Zoster Vaccines- Shingrix  Completed   Meningococcal B Vaccine  Aged Out   COVID-19 Vaccine  Discontinued        Assessment/Plan:  This is a routine wellness examination for [place].  Patient Care Team: Norleen Lynwood ORN, MD as PCP - General (Internal Medicine) O'Neal, Darryle Ned, MD as PCP - Cardiology (Cardiology)  I have personally reviewed and noted the following in the patients chart:   Medical and social history Use of alcohol , tobacco or illicit drugs  Current medications and supplements including opioid prescriptions. Functional ability and status Nutritional status Physical activity Advanced  directives List of other physicians Hospitalizations, surgeries, and ER visits in previous 12 months Vitals Screenings to include cognitive, depression, and falls Referrals and appointments  No orders of the defined types were placed in this encounter.  In addition, I have reviewed and discussed with patient certain preventive protocols, quality metrics, and best practice recommendations. A written personalized care plan for preventive services as well as general preventive health recommendations were provided to patient.   Verdie CHRISTELLA Saba, CMA   04/24/2024   Return in 1 year (on 04/24/2025).  After Visit Summary: (MyChart) Due to this being a telephonic visit, the after visit summary with patients personalized plan was offered to patient via MyChart   Nurse Notes: scheduled 3-mth f/u w/PCP.

## 2024-04-25 ENCOUNTER — Telehealth: Payer: Self-pay

## 2024-04-25 ENCOUNTER — Telehealth: Payer: Self-pay | Admitting: Cardiology

## 2024-04-25 ENCOUNTER — Encounter: Payer: Self-pay | Admitting: Cardiovascular Disease

## 2024-04-25 NOTE — Telephone Encounter (Signed)
 Patient called in about the referral that was sent on his behalf. Reach out to the office, they said they didn't received the referral. Office request that we refax the referral over. Please advise

## 2024-04-25 NOTE — Telephone Encounter (Signed)
 Copied from CRM #[id number]. Topic: General - Other >> Apr 25, 2024  2:33 PM Drema MATSU wrote: Reason for CRM: Patient stated that he had a flu shot at his previous appointment with pcp. Patient is requesting a callback to confirm it. He states that he remember that he had one this year. Only showing last flu shot for 01/27/23

## 2024-05-08 ENCOUNTER — Other Ambulatory Visit (HOSPITAL_COMMUNITY): Payer: Self-pay

## 2024-05-20 ENCOUNTER — Ambulatory Visit: Payer: Self-pay

## 2024-05-20 NOTE — Telephone Encounter (Signed)
 FYI Only or Action Required?: FYI only for provider: appointment scheduled on 1/8 at pt request d/t transportation, offered appt on 1/6-declined.  Patient was last seen in primary care on 02/01/2024 by Norleen Lynwood ORN, MD.  Called Nurse Triage reporting Leg Swelling.  Symptoms began several weeks ago.  Interventions attempted: Nothing.  Symptoms are: gradually worsening.  Triage Disposition: See Physician Within 24 Hours  Patient/caregiver understands and will follow disposition?: No, wishes to speak with PCP  Copied from CRM #[id number]. Topic: Clinical - Red Word Triage >> May 20, 2024  2:13 PM Collin Bridges wrote: Red Word that prompted transfer to Nurse Triage: swelling in his legs; gets tired quickly Reason for Disposition  [1] MODERATE leg swelling (e.g., swelling extends up to knees) AND [2] new-onset or getting worse  Answer Assessment - Initial Assessment Questions 1. ONSET: When did the swelling start? (e.g., minutes, hours, days)     2-3 weeks 2. LOCATION: What part of the leg is swollen?  Are both legs swollen or just one leg?     BLE, states has had trouble since open heart surg, states it went away but is back 3. SEVERITY: How bad is the swelling? (e.g., localized; mild, moderate, severe)     Knee down to toes,  4. REDNESS: Is there redness or signs of infection?     Denies 5. PAIN: Is the swelling painful to touch? If Yes, ask: How painful is it?   (Scale 1-10; mild, moderate or severe)     1 6. FEVER: Do you have a fever? If Yes, ask: What is it, how was it measured, and when did it start?      denies 7. CAUSE: What do you think is causing the leg swelling?     Unsure, had this after open heart surg 8. MEDICAL HISTORY: Do you have a history of blood clots (e.g., DVT), cancer, heart failure, kidney disease, or liver failure?     States he's creatinine is not good 10. OTHER SYMPTOMS: Do you have any other symptoms? (e.g., chest pain, difficulty  breathing)       Denies SOB, denies CP  Protocols used: Leg Swelling and Edema-A-AH

## 2024-05-23 ENCOUNTER — Encounter: Payer: Self-pay | Admitting: Internal Medicine

## 2024-05-23 ENCOUNTER — Other Ambulatory Visit: Payer: Self-pay

## 2024-05-23 ENCOUNTER — Other Ambulatory Visit (HOSPITAL_COMMUNITY): Payer: Self-pay

## 2024-05-23 ENCOUNTER — Ambulatory Visit: Admitting: Internal Medicine

## 2024-05-23 VITALS — BP 124/78 | HR 72 | Temp 97.4°F | Ht 71.0 in | Wt 206.0 lb

## 2024-05-23 DIAGNOSIS — I5032 Chronic diastolic (congestive) heart failure: Secondary | ICD-10-CM

## 2024-05-23 DIAGNOSIS — Z23 Encounter for immunization: Secondary | ICD-10-CM | POA: Diagnosis not present

## 2024-05-23 DIAGNOSIS — G4700 Insomnia, unspecified: Secondary | ICD-10-CM | POA: Diagnosis not present

## 2024-05-23 DIAGNOSIS — N1831 Chronic kidney disease, stage 3a: Secondary | ICD-10-CM

## 2024-05-23 DIAGNOSIS — E1122 Type 2 diabetes mellitus with diabetic chronic kidney disease: Secondary | ICD-10-CM | POA: Diagnosis not present

## 2024-05-23 DIAGNOSIS — I5030 Unspecified diastolic (congestive) heart failure: Secondary | ICD-10-CM | POA: Insufficient documentation

## 2024-05-23 DIAGNOSIS — E785 Hyperlipidemia, unspecified: Secondary | ICD-10-CM

## 2024-05-23 MED ORDER — ZOLPIDEM TARTRATE 10 MG PO TABS
10.0000 mg | ORAL_TABLET | ORAL | 1 refills | Status: AC | PRN
  Filled 2024-05-23: qty 90, fill #0
  Filled 2024-05-25: qty 90, 90d supply, fill #0

## 2024-05-23 NOTE — Assessment & Plan Note (Signed)
 Ckd3a  Lab Results  Component Value Date   HGBA1C 6.6 (H) 02/01/2024   Stable, pt to continue current medical treatment  - diet, wt control

## 2024-05-23 NOTE — Assessment & Plan Note (Signed)
 Chronic stable persistent, ok for ambien  refill prn asd

## 2024-05-23 NOTE — Assessment & Plan Note (Signed)
 With mild worsening volume, but rather than try change tx today and cause confusion,pt will see Renal tomorrow who undoubtably will make the appropriate changes, I suspect possible increase lasix  40 bid, but will defer to renal for this

## 2024-05-23 NOTE — Progress Notes (Signed)
 Patient ID: Collin Bridges, male   DOB: 04-Oct-1945, 79 y.o.   MRN: [id number]        Chief Complaint: follow up diast chf, dm with ckd3a, insomnia, hld       HPI:  Collin Bridges is a 79 y.o. male here with c/o worsening LE edema and several lbs wt gain, but Pt denies chest pain, increased sob or doe, wheezing, orthopnea, PND, palpitations, dizziness or syncope.   Pt denies polydipsia, polyuria, or new focal neuro s/s.    Pt denies fever, wt loss, night sweats, loss of appetite, or other constitutional symptoms  Has appt with renal tomorrow, and cardiology soon.  Pt has persistent insomnia, but ambien  works well.   Wt Readings from Last 3 Encounters:  05/23/24 206 lb (93.4 kg)  04/24/24 200 lb (90.7 kg)  04/10/24 200 lb 14.4 oz (91.1 kg)   BP Readings from Last 3 Encounters:  05/23/24 124/78  04/10/24 (!) 144/90  04/05/24 (!) 174/84         Past Medical History:  Diagnosis Date   Allergy    seasonal   Anginal pain    Anxiety    Brain aneurysm 2022   'watching it' ,HAS NOT HAD SURGERY- dR SETHI   CHF (congestive heart failure) (HCC)    Coronary artery disease    Depression    Diabetes mellitus without complication (HCC)    Type 2   GERD (gastroesophageal reflux disease)    Pt states take ambien  for this   Hepatitis C    HLD (hyperlipidemia) 08/09/2017   Hypertension    Hypoglycemia    Pneumonia 06/2022   Stroke (HCC) 03/2021   TIA   Past Surgical History:  Procedure Laterality Date   APPENDECTOMY     BUBBLE STUDY  06/16/2021   Procedure: BUBBLE STUDY;  Surgeon: Barbaraann Darryle Ned, MD;  Location: [facility] ENDOSCOPY;  Service: Cardiovascular;;   COLONOSCOPY     CORONARY ARTERY BYPASS GRAFT N/A 11/10/2022   Procedure: CORONARY ARTERY BYPASS GRAFTING (CABG) X5 USING LEFT INTERNAL MAMMARY ARTERY (LIMA) AND ENDOSCOPICALLY HARVESTED RIGHT AND LEFT GREATER SAPHENOUS VEIN;  Surgeon: Maryjane Mt, MD;  Location: [facility] OR;  Service: Open Heart Surgery;  Laterality: N/A;    CORONARY ARTERY BYPASS GRAFT     5   FRACTURE SURGERY Left    Ankle   LEFT HEART CATH AND CORONARY ANGIOGRAPHY N/A 07/12/2022   Procedure: LEFT HEART CATH AND CORONARY ANGIOGRAPHY;  Surgeon: Anner Alm ORN, MD;  Location: [facility] INVASIVE CV LAB;  Service: Cardiovascular;  Laterality: N/A;   TEE WITHOUT CARDIOVERSION N/A 06/16/2021   Procedure: TRANSESOPHAGEAL ECHOCARDIOGRAM (TEE);  Surgeon: Barbaraann Darryle Ned, MD;  Location: [facility] ENDOSCOPY;  Service: Cardiovascular;  Laterality: N/A;   TEE WITHOUT CARDIOVERSION N/A 11/10/2022   Procedure: TRANSESOPHAGEAL ECHOCARDIOGRAM;  Surgeon: Maryjane Mt, MD;  Location: [facility] OR;  Service: Open Heart Surgery;  Laterality: N/A;   TONSILECTOMY, ADENOIDECTOMY, BILATERAL MYRINGOTOMY AND TUBES  1955    reports that he has quit smoking. He has never used smokeless tobacco. He reports that he does not currently use alcohol . He reports that he does not use drugs. family history includes Hyperlipidemia in his father; Hypertension in his father; Lung disease in his mother; Prostate cancer in his paternal uncle. Allergies[1] Medications Ordered Prior to Encounter[2]      ROS:  All others reviewed and negative.  Objective        PE:  BP 124/78 (BP Location: Left Arm, Patient Position: Sitting,  Cuff Size: Normal)   Pulse 72   Temp (!) 97.4 F (36.3 C) (Oral)   Ht 5' 11 (1.803 m)   Wt 206 lb (93.4 kg)   SpO2 98%   BMI 28.73 kg/m                 Constitutional: Pt appears in NAD               HENT: Head: NCAT.                Right Ear: External ear normal.                 Left Ear: External ear normal.                Eyes: . Pupils are equal, round, and reactive to light. Conjunctivae and EOM are normal               Nose: without d/c or deformity               Neck: Neck supple. Gross normal ROM               Cardiovascular: Normal rate and regular rhythm.                 Pulmonary/Chest: Effort normal and breath sounds without rales or wheezing.                 Abd:  Soft, NT, ND, + BS, no organomegaly               Neurological: Pt is alert. At baseline orientation, motor grossly intact               Skin: Skin is warm. No rashes, no other new lesions, LE edema - 2+ to knees bilateral               Psychiatric: Pt behavior is normal without agitation   Micro: none  Cardiac tracings I have personally interpreted today:  none  Pertinent Radiological findings (summarize): none   Lab Results  Component Value Date   WBC 7.6 02/01/2024   HGB 13.7 02/01/2024   HCT 40.5 02/01/2024   PLT 156.0 02/01/2024   GLUCOSE 111 (H) 04/10/2024   CHOL 87 02/01/2024   TRIG 117.0 02/01/2024   HDL 51.00 02/01/2024   LDLDIRECT 146.0 06/03/2022   LDLCALC 13 02/01/2024   ALT 20 02/01/2024   AST 30 02/01/2024   NA 141 04/10/2024   K 4.7 04/10/2024   CL 98 04/10/2024   CREATININE 1.84 (H) 04/10/2024   BUN 19 04/10/2024   CO2 27 04/10/2024   TSH 1.15 02/01/2024   PSA 3.06 06/03/2022   INR 1.3 (H) 11/22/2022   HGBA1C 6.6 (H) 02/01/2024   MICROALBUR <0.7 07/27/2023   Assessment/Plan:  Collin Bridges is a 79 y.o. White or Caucasian [1] male with  has a past medical history of Allergy, Anginal pain, Anxiety, Brain aneurysm (2022), CHF (congestive heart failure) (HCC), Coronary artery disease, Depression, Diabetes mellitus without complication (HCC), GERD (gastroesophageal reflux disease), Hepatitis C, HLD (hyperlipidemia) (08/09/2017), Hypertension, Hypoglycemia, Pneumonia (06/2022), and Stroke (HCC) (03/2021).  Diastolic congestive heart failure (HCC) With mild worsening volume, but rather than try change tx today and cause confusion,pt will see Renal tomorrow who undoubtably will make the appropriate changes, I suspect possible increase lasix  40 bid, but will defer to renal for this  Hyperlipidemia LDL goal <70 Lab Results  Component Value Date  LDLCALC 13 02/01/2024   Stable, pt to continue current statin repatha  140, fenofibrate  160  qd   Diabetes mellitus with renal complications (HCC) Ckd3a  Lab Results  Component Value Date   HGBA1C 6.6 (H) 02/01/2024   Stable, pt to continue current medical treatment  - diet, wt control   Insomnia Chronic stable persistent, ok for ambien  refill prn asd Followup: Return in about 6 months (around 11/20/2024).  Collin Rush, MD 05/23/2024 6:35 PM [facility] Internal Medicine     [1] No Known Allergies [2]  Current Outpatient Medications on File Prior to Visit  Medication Sig Dispense Refill   aspirin  EC 81 MG tablet Take 1 tablet (81 mg total) by mouth daily. Swallow whole. 30 tablet 12   cholecalciferol (VITAMIN D3) 25 MCG (1000 UNIT) tablet Take 1 tablet (1,000 Units total) by mouth daily. Take 1,000 Units by mouth daily. 90 tablet 2   clopidogrel  (PLAVIX ) 75 MG tablet Take 1 tablet (75 mg total) by mouth daily. 90 tablet 3   Evolocumab  (REPATHA  SURECLICK) 140 MG/ML SOAJ Inject 140 mg into the skin every 14 days. 6 mL 3   fenofibrate  160 MG tablet Take 1 tablet (160 mg total) by mouth daily. 90 tablet 1   fluticasone  (FLONASE ) 50 MCG/ACT nasal spray Place 2 sprays into both nostrils daily. (Patient taking differently: Place 2 sprays into both nostrils daily. PRN) 48 g 1   furosemide  (LASIX ) 40 MG tablet Take 1 tablet (40 mg total) by mouth daily. 90 tablet 1   gabapentin  (NEURONTIN ) 300 MG capsule Take 1 capsule (300 mg total) by mouth at bedtime. 90 capsule 1   magnesium  oxide (MAG-OX) 400 (240 Mg) MG tablet Take 400 mg by mouth daily.     metoprolol  tartrate (LOPRESSOR ) 25 MG tablet Take 1 tablet (25 mg total) by mouth 2 (two) times daily. 180 tablet 3   PARoxetine  (PAXIL ) 40 MG tablet Take 1 tablet (40 mg total) by mouth every morning. 90 tablet 1   tadalafil  (CIALIS ) 20 MG tablet Take 0.5-1 tablets (10-20 mg total) by mouth every other day as needed for erectile dysfunction. 10 tablet 11   vitamin E 180 MG (400 UNITS)  capsule Take 400 Units by mouth daily.     No current facility-administered medications on file prior to visit.

## 2024-05-23 NOTE — Patient Instructions (Signed)
 You had the flu shot today  Please continue all other medications as before, and refills have been done for the ambien   Please have the pharmacy call with any other refills you may need.  Please continue your efforts at being more active, low cholesterol diet, and weight control.  Please keep your appointments with your specialists as you may have planned - Renal tomorrow, and cardiology  Please make an Appointment to return in 6 months, or sooner if needed

## 2024-05-23 NOTE — Assessment & Plan Note (Signed)
 Lab Results  Component Value Date   LDLCALC 13 02/01/2024   Stable, pt to continue current statin repatha  140, fenofibrate  160 qd

## 2024-05-24 ENCOUNTER — Other Ambulatory Visit: Payer: Self-pay | Admitting: Nephrology

## 2024-05-24 DIAGNOSIS — N1832 Chronic kidney disease, stage 3b: Secondary | ICD-10-CM

## 2024-05-25 ENCOUNTER — Other Ambulatory Visit (HOSPITAL_COMMUNITY): Payer: Self-pay

## 2024-05-26 ENCOUNTER — Other Ambulatory Visit: Payer: Self-pay

## 2024-05-27 ENCOUNTER — Other Ambulatory Visit: Payer: Self-pay | Admitting: Cardiovascular Disease

## 2024-05-27 ENCOUNTER — Encounter: Payer: Self-pay | Admitting: Pharmacist

## 2024-05-27 ENCOUNTER — Other Ambulatory Visit: Payer: Self-pay

## 2024-05-27 MED ORDER — METOPROLOL TARTRATE 25 MG PO TABS
25.0000 mg | ORAL_TABLET | Freq: Two times a day (BID) | ORAL | 1 refills | Status: AC
  Filled 2024-05-27: qty 180, 90d supply, fill #0

## 2024-05-28 ENCOUNTER — Other Ambulatory Visit: Payer: Self-pay

## 2024-05-28 ENCOUNTER — Ambulatory Visit: Admitting: Internal Medicine

## 2024-05-30 ENCOUNTER — Inpatient Hospital Stay: Admission: RE | Admit: 2024-05-30 | Discharge: 2024-05-30 | Attending: Nephrology

## 2024-05-30 DIAGNOSIS — N1832 Chronic kidney disease, stage 3b: Secondary | ICD-10-CM

## 2024-06-04 ENCOUNTER — Other Ambulatory Visit: Payer: Self-pay | Admitting: Cardiovascular Disease

## 2024-06-04 ENCOUNTER — Other Ambulatory Visit (HOSPITAL_COMMUNITY): Payer: Self-pay

## 2024-06-10 ENCOUNTER — Other Ambulatory Visit (HOSPITAL_BASED_OUTPATIENT_CLINIC_OR_DEPARTMENT_OTHER): Payer: Self-pay

## 2024-06-10 ENCOUNTER — Other Ambulatory Visit: Payer: Self-pay

## 2024-06-10 MED ORDER — CLOPIDOGREL BISULFATE 75 MG PO TABS
75.0000 mg | ORAL_TABLET | Freq: Every day | ORAL | 3 refills | Status: AC
  Filled 2024-06-10: qty 90, 90d supply, fill #0

## 2024-06-14 NOTE — Progress Notes (Unsigned)
" °  Cardiology Office Note:  .   Date:  06/14/2024  ID:  Rahm, Minix 27-Jul-1945, MRN [id number] PCP: Norleen Lynwood ORN, MD  [facility] Providers Cardiologist:  Darryle ONEIDA Decent, MD   History of Present Illness: .   No chief complaint on file.   Collin Bridges is a 79 y.o. male with below history who presents for follow-up.   History of Present Illness              *** Problem List CAD -NSTEMI 06/2022 -CABG x 5 10/21/2022 2. HLD -T chol 88, HDL 49, LDL 17, TG 122 3. Postoperative Afib  4. HTN 5. Aortic stenosis -mild 04/09/2024 6. CVA -bilateral cerebral infarcts 02/2021 -severe intracranial atherosclerosis/lacunar infarct  7. Meningioma  8. Cirrhosis 9. HFpEF 10. CKD IIIa    ROS: All other ROS reviewed and negative. Pertinent positives noted in the HPI.     Studies Reviewed: SABRA       TTE 04/09/2024  1. Left ventricular ejection fraction, by estimation, is 65 to 70%. The  left ventricle has normal function. The left ventricle has no regional  wall motion abnormalities. Left ventricular diastolic parameters are  indeterminate.   2. Right ventricular systolic function is normal. The right ventricular  size is normal. Tricuspid regurgitation signal is inadequate for assessing  PA pressure.   3. The mitral valve is degenerative. Trivial mitral valve regurgitation.  No evidence of mitral stenosis. Severe mitral annular calcification.   4. The aortic valve is abnormal. There is moderate calcification of the  aortic valve. There is moderate thickening of the aortic valve. Aortic  valve regurgitation is not visualized. Mild aortic valve stenosis. Aortic  valve area, by VTI measures 1.46  cm. Aortic valve mean gradient measures 16.0 mmHg. Aortic valve Vmax  measures 2.62 m/s.   5. The inferior vena cava is normal in size with greater than 50%  respiratory variability, suggesting right atrial pressure of 3 mmHg.   Physical Exam:   VS:  There  were no vitals taken for this visit.   Wt Readings from Last 3 Encounters:  05/23/24 206 lb (93.4 kg)  04/24/24 200 lb (90.7 kg)  04/10/24 200 lb 14.4 oz (91.1 kg)    GEN: Well nourished, well developed in no acute distress NECK: No JVD; No carotid bruits CARDIAC: ***RRR, no murmurs, rubs, gallops RESPIRATORY:  Clear to auscultation without rales, wheezing or rhonchi  ABDOMEN: Soft, non-tender, non-distended EXTREMITIES:  No edema; No deformity  ASSESSMENT AND PLAN: .   Assessment and Plan                 {Are you ordering a CV Procedure (e.g. stress test, cath, DCCV, TEE, etc)?   Press F2        :[id number]}   Follow-up: No follow-ups on file.  Signed, Darryle ONEIDA. Decent, MD, [facility]  [facility]  [address] [place], [place] [address] [phone]  1:36 PM   "

## 2024-06-15 ENCOUNTER — Other Ambulatory Visit (HOSPITAL_COMMUNITY): Payer: Self-pay

## 2024-06-17 ENCOUNTER — Other Ambulatory Visit (HOSPITAL_COMMUNITY): Payer: Self-pay

## 2024-06-17 ENCOUNTER — Ambulatory Visit: Admitting: Cardiovascular Disease

## 2024-06-17 ENCOUNTER — Other Ambulatory Visit: Payer: Self-pay

## 2024-06-17 DIAGNOSIS — N1831 Chronic kidney disease, stage 3a: Secondary | ICD-10-CM

## 2024-06-17 DIAGNOSIS — E785 Hyperlipidemia, unspecified: Secondary | ICD-10-CM

## 2024-06-17 DIAGNOSIS — I25810 Atherosclerosis of coronary artery bypass graft(s) without angina pectoris: Secondary | ICD-10-CM

## 2024-06-17 DIAGNOSIS — I350 Nonrheumatic aortic (valve) stenosis: Secondary | ICD-10-CM

## 2024-06-17 DIAGNOSIS — I10 Essential (primary) hypertension: Secondary | ICD-10-CM

## 2024-06-17 DIAGNOSIS — I5032 Chronic diastolic (congestive) heart failure: Secondary | ICD-10-CM

## 2024-06-18 ENCOUNTER — Telehealth: Payer: Self-pay

## 2024-06-19 ENCOUNTER — Other Ambulatory Visit: Payer: Self-pay

## 2024-06-19 ENCOUNTER — Other Ambulatory Visit (HOSPITAL_COMMUNITY): Payer: Self-pay

## 2024-06-19 ENCOUNTER — Encounter: Payer: Self-pay | Admitting: Cardiovascular Disease

## 2024-06-19 ENCOUNTER — Ambulatory Visit: Admitting: Cardiovascular Disease

## 2024-06-19 VITALS — BP 120/60 | HR 66 | Ht 71.0 in | Wt 203.0 lb

## 2024-06-19 DIAGNOSIS — E782 Mixed hyperlipidemia: Secondary | ICD-10-CM

## 2024-06-19 DIAGNOSIS — R600 Localized edema: Secondary | ICD-10-CM

## 2024-06-19 DIAGNOSIS — N1832 Chronic kidney disease, stage 3b: Secondary | ICD-10-CM | POA: Diagnosis not present

## 2024-06-19 DIAGNOSIS — I350 Nonrheumatic aortic (valve) stenosis: Secondary | ICD-10-CM

## 2024-06-19 DIAGNOSIS — I10 Essential (primary) hypertension: Secondary | ICD-10-CM | POA: Diagnosis not present

## 2024-06-19 DIAGNOSIS — I5032 Chronic diastolic (congestive) heart failure: Secondary | ICD-10-CM

## 2024-06-19 DIAGNOSIS — I25810 Atherosclerosis of coronary artery bypass graft(s) without angina pectoris: Secondary | ICD-10-CM | POA: Diagnosis not present

## 2024-06-19 DIAGNOSIS — I872 Venous insufficiency (chronic) (peripheral): Secondary | ICD-10-CM

## 2024-06-19 DIAGNOSIS — N1831 Chronic kidney disease, stage 3a: Secondary | ICD-10-CM

## 2024-06-19 MED ORDER — EMPAGLIFLOZIN 10 MG PO TABS
10.0000 mg | ORAL_TABLET | Freq: Every day | ORAL | 11 refills | Status: AC
  Filled 2024-06-19: qty 30, 30d supply, fill #0

## 2024-06-19 NOTE — Patient Instructions (Addendum)
 Medication Instructions:  STOP Aspirin   START Empagliflozin  (Jardiance ) 10 mg. Take one (1) tablet by mouth once daily.  *If you need a refill on your cardiac medications before your next appointment, please call your pharmacy*  Lab Work: None ordered If you have labs (blood work) drawn today and your tests are completely normal, you will receive your results only by: MyChart Message (if you have MyChart) OR A paper copy in the mail If you have any lab test that is abnormal or we need to change your treatment, we will call you to review the results.  Testing/Procedures: Lower Extremity Vascular Ultrasound  Follow-Up: At [facility], you and your health needs are our priority.  As part of our continuing mission to provide you with exceptional heart care, our providers are all part of one team.  This team includes your primary Cardiologist (physician) and Advanced Practice Providers or APPs (Physician Assistants and Nurse Practitioners) who all work together to provide you with the care you need, when you need it.  Your next appointment:   6 month(s)  Provider:   One of our Advanced Practice Providers (APPs): Morse Clause, PA-C  Hanh Waddell Daniels, PA-C  Saddie Cleaves, NP  Olivia Pavy, PA-C Miriam Shams, NP  Leontine Salen, PA-C Josefa Beauvais, NP  [facility], PA-C [place], PA-C  [place], PA-C Hunter Davis, [place]  Damien Braver, NP Jon Hails, PA-C  Waddell Donath, PA-C Dayna Dunn, PA-C  [place], PA-C [place], [place] Glendia Ferrier, PA-C Callie Goodrich, PA-C  Katlyn West, NP Thom Sluder, PA-C  Alyssa White, NP Rollo Louder, PA-C Xika Zhao, NP    Lamarr Satterfield, NP                     Then, Darryle ONEIDA Decent, MD will plan to see you again in 1 year(s).    We recommend signing up for the patient portal called MyChart.  Sign up information is provided on this After Visit Summary.  MyChart is used to connect with patients for Virtual Visits  (Telemedicine).  Patients are able to view lab/test results, encounter notes, upcoming appointments, etc.  Non-urgent messages can be sent to your provider as well.   To learn more about what you can do with MyChart, go to [email or web address].

## 2024-06-20 ENCOUNTER — Other Ambulatory Visit: Payer: Self-pay

## 2024-06-21 ENCOUNTER — Other Ambulatory Visit (HOSPITAL_COMMUNITY): Payer: Self-pay

## 2024-06-21 ENCOUNTER — Ambulatory Visit: Admitting: Cardiovascular Disease

## 2024-07-19 ENCOUNTER — Ambulatory Visit (HOSPITAL_COMMUNITY)

## 2025-03-27 ENCOUNTER — Ambulatory Visit: Admitting: Neurology

## 2025-04-25 ENCOUNTER — Ambulatory Visit
